# Patient Record
Sex: Female | Born: 1945
Health system: Southern US, Community
[De-identification: ages and names within clinical notes are randomized; demographics above are authoritative.]

## PROBLEM LIST (undated history)

## (undated) DIAGNOSIS — J9601 Acute respiratory failure with hypoxia: Secondary | ICD-10-CM

## (undated) DIAGNOSIS — J449 Chronic obstructive pulmonary disease, unspecified: Secondary | ICD-10-CM

## (undated) DIAGNOSIS — I313 Pericardial effusion (noninflammatory): Secondary | ICD-10-CM

## (undated) DIAGNOSIS — I509 Heart failure, unspecified: Secondary | ICD-10-CM

## (undated) DIAGNOSIS — J301 Allergic rhinitis due to pollen: Secondary | ICD-10-CM

## (undated) DIAGNOSIS — J439 Emphysema, unspecified: Secondary | ICD-10-CM

## (undated) DIAGNOSIS — I10 Essential (primary) hypertension: Secondary | ICD-10-CM

## (undated) DIAGNOSIS — T7840XA Allergy, unspecified, initial encounter: Secondary | ICD-10-CM

## (undated) DIAGNOSIS — I5189 Other ill-defined heart diseases: Secondary | ICD-10-CM

## (undated) DIAGNOSIS — I272 Pulmonary hypertension, unspecified: Secondary | ICD-10-CM

## (undated) DIAGNOSIS — Z889 Allergy status to unspecified drugs, medicaments and biological substances status: Secondary | ICD-10-CM

## (undated) DIAGNOSIS — I3139 Other pericardial effusion (noninflammatory): Secondary | ICD-10-CM

## (undated) DIAGNOSIS — Z87891 Personal history of nicotine dependence: Secondary | ICD-10-CM

## (undated) DIAGNOSIS — Z87440 Personal history of urinary (tract) infections: Secondary | ICD-10-CM

## (undated) DIAGNOSIS — R600 Localized edema: Secondary | ICD-10-CM

## (undated) DIAGNOSIS — M1990 Unspecified osteoarthritis, unspecified site: Secondary | ICD-10-CM

## (undated) DIAGNOSIS — I50810 Right heart failure, unspecified: Secondary | ICD-10-CM

## (undated) DIAGNOSIS — I119 Hypertensive heart disease without heart failure: Secondary | ICD-10-CM

## (undated) DIAGNOSIS — J45909 Unspecified asthma, uncomplicated: Secondary | ICD-10-CM

## (undated) DIAGNOSIS — Z8619 Personal history of other infectious and parasitic diseases: Secondary | ICD-10-CM

## (undated) HISTORY — DX: Allergic rhinitis due to pollen: J30.1

## (undated) HISTORY — PX: CHOLECYSTECTOMY: SHX55

## (undated) HISTORY — DX: Personal history of other infectious and parasitic diseases: Z86.19

## (undated) HISTORY — DX: Personal history of urinary (tract) infections: Z87.440

## (undated) HISTORY — DX: Unspecified osteoarthritis, unspecified site: M19.90

## (undated) HISTORY — DX: Emphysema, unspecified: J43.9

## (undated) HISTORY — DX: Unspecified asthma, uncomplicated: J45.909

## (undated) HISTORY — DX: Heart failure, unspecified: I50.9

## (undated) HISTORY — DX: Allergy, unspecified, initial encounter: T78.40XA

---

## 1998-06-25 ENCOUNTER — Other Ambulatory Visit: Admission: RE | Admit: 1998-06-25 | Discharge: 1998-06-25 | Payer: Self-pay | Admitting: Obstetrics & Gynecology

## 1998-11-26 ENCOUNTER — Other Ambulatory Visit: Admission: RE | Admit: 1998-11-26 | Discharge: 1998-11-26 | Payer: Self-pay | Admitting: Obstetrics & Gynecology

## 1998-12-08 DIAGNOSIS — M7500 Adhesive capsulitis of unspecified shoulder: Secondary | ICD-10-CM | POA: Insufficient documentation

## 1999-11-27 ENCOUNTER — Other Ambulatory Visit: Admission: RE | Admit: 1999-11-27 | Discharge: 1999-11-27 | Payer: Self-pay | Admitting: Obstetrics and Gynecology

## 2000-11-27 ENCOUNTER — Other Ambulatory Visit: Admission: RE | Admit: 2000-11-27 | Discharge: 2000-11-27 | Payer: Self-pay | Admitting: Obstetrics and Gynecology

## 2001-12-08 ENCOUNTER — Other Ambulatory Visit: Admission: RE | Admit: 2001-12-08 | Discharge: 2001-12-08 | Payer: Self-pay | Admitting: Obstetrics and Gynecology

## 2002-12-14 ENCOUNTER — Other Ambulatory Visit: Admission: RE | Admit: 2002-12-14 | Discharge: 2002-12-14 | Payer: Self-pay | Admitting: Obstetrics and Gynecology

## 2003-12-20 ENCOUNTER — Other Ambulatory Visit: Admission: RE | Admit: 2003-12-20 | Discharge: 2003-12-20 | Payer: Self-pay | Admitting: Obstetrics and Gynecology

## 2005-01-08 ENCOUNTER — Other Ambulatory Visit: Admission: RE | Admit: 2005-01-08 | Discharge: 2005-01-08 | Payer: Self-pay | Admitting: Obstetrics and Gynecology

## 2005-09-02 ENCOUNTER — Ambulatory Visit: Payer: Self-pay | Admitting: Family Medicine

## 2006-08-26 ENCOUNTER — Ambulatory Visit: Payer: Self-pay | Admitting: Family Medicine

## 2006-09-07 ENCOUNTER — Ambulatory Visit: Payer: Self-pay | Admitting: Family Medicine

## 2007-01-15 ENCOUNTER — Ambulatory Visit: Payer: Self-pay | Admitting: Family Medicine

## 2007-01-15 DIAGNOSIS — J309 Allergic rhinitis, unspecified: Secondary | ICD-10-CM | POA: Insufficient documentation

## 2007-01-15 DIAGNOSIS — F17200 Nicotine dependence, unspecified, uncomplicated: Secondary | ICD-10-CM | POA: Insufficient documentation

## 2007-05-17 ENCOUNTER — Ambulatory Visit: Payer: Self-pay | Admitting: Family Medicine

## 2007-05-17 DIAGNOSIS — J321 Chronic frontal sinusitis: Secondary | ICD-10-CM | POA: Insufficient documentation

## 2007-11-12 ENCOUNTER — Ambulatory Visit: Payer: Self-pay | Admitting: Family Medicine

## 2007-11-12 DIAGNOSIS — J029 Acute pharyngitis, unspecified: Secondary | ICD-10-CM | POA: Insufficient documentation

## 2007-12-22 ENCOUNTER — Ambulatory Visit: Payer: Self-pay | Admitting: Family Medicine

## 2007-12-22 DIAGNOSIS — S335XXA Sprain of ligaments of lumbar spine, initial encounter: Secondary | ICD-10-CM | POA: Insufficient documentation

## 2007-12-22 LAB — CONVERTED CEMR LAB
Bilirubin Urine: NEGATIVE
Glucose, Urine, Semiquant: NEGATIVE
Ketones, urine, test strip: NEGATIVE
pH: 6

## 2008-02-01 ENCOUNTER — Encounter: Admission: RE | Admit: 2008-02-01 | Discharge: 2008-02-01 | Payer: Self-pay | Admitting: Family Medicine

## 2008-02-01 ENCOUNTER — Ambulatory Visit: Payer: Self-pay | Admitting: Family Medicine

## 2008-02-01 ENCOUNTER — Encounter (INDEPENDENT_AMBULATORY_CARE_PROVIDER_SITE_OTHER): Payer: Self-pay | Admitting: Internal Medicine

## 2008-02-01 DIAGNOSIS — R062 Wheezing: Secondary | ICD-10-CM | POA: Insufficient documentation

## 2008-02-04 ENCOUNTER — Ambulatory Visit: Payer: Self-pay | Admitting: Family Medicine

## 2008-02-07 ENCOUNTER — Telehealth (INDEPENDENT_AMBULATORY_CARE_PROVIDER_SITE_OTHER): Payer: Self-pay | Admitting: *Deleted

## 2009-10-01 ENCOUNTER — Ambulatory Visit: Payer: Self-pay | Admitting: Family Medicine

## 2009-10-08 ENCOUNTER — Telehealth: Payer: Self-pay | Admitting: Family Medicine

## 2009-10-19 ENCOUNTER — Telehealth: Payer: Self-pay | Admitting: Family Medicine

## 2010-01-30 ENCOUNTER — Ambulatory Visit: Payer: Self-pay | Admitting: Family Medicine

## 2010-01-30 DIAGNOSIS — J019 Acute sinusitis, unspecified: Secondary | ICD-10-CM | POA: Insufficient documentation

## 2010-02-05 ENCOUNTER — Ambulatory Visit: Payer: Self-pay | Admitting: Family Medicine

## 2010-02-05 ENCOUNTER — Telehealth: Payer: Self-pay | Admitting: Family Medicine

## 2010-02-05 DIAGNOSIS — J209 Acute bronchitis, unspecified: Secondary | ICD-10-CM | POA: Insufficient documentation

## 2010-08-21 ENCOUNTER — Ambulatory Visit: Payer: Self-pay | Admitting: Family Medicine

## 2010-10-08 NOTE — Progress Notes (Signed)
Summary: still with sinus sxs  Phone Note Call from Patient Call back at Home Phone [phone]   Caller: Patient Summary of Call: Pt was given z-pack for sinus infection on 1/24. She says she is much better but still has some pain and fluid in her ears, some sinus congestion.  She is asking if she should have another course of z-pack, she is worried the infection will come back full force.  [facility]  Please advise pt, leave message if she is not in. Initial call taken by: Marty Heck CMA,  October 19, 2009 11:31 AM  Follow-up for Phone Call        Do not recommend repeat of antibitoics...instead..use mucinex reguarly and nasal saline irrigation 3-4 times daily.  MAke follow up appt if notcontinuing to improve.  Follow-up by: Eliezer Lofts MD,  October 19, 2009 1:54 PM  Additional Follow-up for Phone Call Additional follow up Details #1::        Left message on voicemail  in detail.  Personalized VM.  Additional Follow-up by: [name] CMA Deborra Medina),  October 19, 2009 2:58 PM

## 2010-10-08 NOTE — Progress Notes (Signed)
Summary: has a rash from the avelox  Phone Note Call from Patient Call back at Home Phone [phone]   Caller: Patient Call For: Arnette Norris MD Summary of Call: Patient started taking the avlox today and she says that now she has a rash all over her feet, arm, and hands. She says that it itches really bad and she feels sick on her stomach. She is not going to take the avelox anymore.  She wants something else called in to [facility]. Initial call taken by: Lacretia Nicks,  Feb 05, 2010 3:40 PM  Follow-up for Phone Call        We are kind of limited as what we can use because of her allergies.  I can try a Zpack. Arnette Norris MD  February 06, 2010 7:23 AM     New/Updated Medications: AZITHROMYCIN 250 MG  TABS (AZITHROMYCIN) 2 by  mouth today and then 1 daily for 4 days Prescriptions: AZITHROMYCIN 250 MG  TABS (AZITHROMYCIN) 2 by  mouth today and then 1 daily for 4 days  #6 x 0   Entered and Authorized by:   Arnette Norris MD   Signed by:   Arnette Norris MD on 02/06/2010   Method used:   Electronically to        [id number] (retail)       [address]       [place], [place]  [address]       Ph: [phone]       Fax: [phone]   RxID:   [id number]   Appended Document: has a rash from the avelox Patient advised as instructed via telephone.  She says she will try anything.  The Zpack is fine, [facility].

## 2010-10-08 NOTE — Progress Notes (Signed)
Summary: head congestion, and sinus  Phone Note Call from Patient Call back at [phone]   Caller: Patient Call For: Dr Deborra Medina Summary of Call: Saw Dr Deborra Medina on 10/01/09 z pack seemed to help and finished on 10/05/09. On 10/07/09 started with sinus stopping up and ears feel stopped .  Productive cough with clear mucus.No sorethroat, no fever. [facility]. Pt understands with lateness of the day it may be tomorrow when she hears decision. Please advise.  Initial call taken by: Ozzie Hoyle LPN,  October 08, 908 6:00 PM  Follow-up for Phone Call        I wouldn't add another antibiotic.  Zpack is still in her system (5 days after finishing last dose).  Takes time for symptoms to resolve.  Continue supportive care and let us know if symptoms worsen or do not improve in next 3-4 days. Follow-up by: Arnette Norris MD,  October 09, 2009 8:04 AM  Additional Follow-up for Phone Call Additional follow up Details #1::        Patient Advised.  Additional Follow-up by: [name] CMA ([place]),  October 09, 2009 10:39 AM

## 2010-10-08 NOTE — Assessment & Plan Note (Signed)
Summary: feeling worse/alc   Vital Signs:  Patient profile:   65 year old female Weight:      153.50 pounds Temp:     98.0 degrees F oral Pulse rate:   68 / minute Pulse rhythm:   regular BP sitting:   140 / 70  (left arm) Cuff size:   regular  Vitals Entered By: Sherrian Divers CMA Deborra Medina) (Feb 05, 2010 11:40 AM) CC: feeling worse, ear ache, cough   History of Present Illness: 65 yo here for worsening symptoms.  Seen on 5/25 with sinus congestion, treated with 7 day course of Bactrim.    Since that time, sinus congestion improved, now wheezing, worsened productive cough.  No SOB or chest pain.  No fevers.    Pt is a smoker.  Current Medications (verified): 1)  Bactrim Ds 800-160 Mg Tabs (Sulfamethoxazole-Trimethoprim) .[name].. 1 Tab By Mouth Two Times A Day X 7 Day 2)  Cheratussin Ac 100-10 Mg/441m Syrp (Guaifenesin-Codeine) .... 5 Ml By Mouth At Bedtime As Needed Cough 3)  Chantix Starting Month Pak 0.5 Mg X 11 & 1 Mg X 42  Misc (Varenicline Tartrate) .... 0.569mBy Mouth Once Daily For 3 Days, Then Twice Daily For 4 Days and Then 41m26my Mouth 2 Times Daily 4)  Avelox 400 Mg  Tabs (Moxifloxacin Hcl) ....[name]1 Tablet By Mouth Daily X 7  Days  Allergies: 1)  ! * Lorbid 2)  ! Augmentin 3)  ! * Omnicef  Review of Systems      See HPI General:  Complains of malaise; denies fever. Resp:  Complains of cough, sputum productive, and wheezing; denies shortness of breath.  Physical Exam  General:  alert, well-developed, well-nourished, and well-hydrated.  NAD Non toxic. Ears:  clear fluid TMs bilaterally. Nose:  erythemaous, improved. Mouth:  no exudates and pharyngeal erythema.   Lungs:  bilateral basal wheezes, left great than right. no crackles. No increased WOB Heart:  Normal rate and regular rhythm. S1 and S2 normal without gallop, murmur, click, rub or other extra sounds. Psych:  normally interactive and good eye contact.     Impression & Recommendations:  Problem # 1:   SINUSITIS, ACUTE (ICD-461.9) Assessment New improved.  The following medications were removed from the medication list:    Claritin-d 24 Hour 10-240 Mg Xr24h-tab (Loratadine-pseudoephedrine) ....[name] Take 1 tablet by mouth once a day Her updated medication list for this problem includes:    Bactrim Ds 800-160 Mg Tabs (Sulfamethoxazole-trimethoprim) ....[name] 1 tab by mouth two times a day x 7 day    Cheratussin Ac 100-10 Mg/5ml69mrp (Guaifenesin-codeine) .....Marland KitchenMarland KitchenMarland KitchenMarland Kitchenl by mouth at bedtime as needed cough    Avelox 400 Mg Tabs (Moxifloxacin hcl) .....[name]1 tablet by mouth daily x 7  days  Problem # 2:  ACUTE BRONCHITIS (ICD-466.0) Assessment: New DOes not want a CXR at this time. Will start Avelox to cover possible pneumonia/bronchitis. The following medications were removed from the medication list:    Claritin-d 24 Hour 10-240 Mg Xr24h-tab (Loratadine-pseudoephedrine) .....Marland KitchenTake 1 tablet by mouth once a day Her updated medication list for this problem includes:    Bactrim Ds 800-160 Mg Tabs (Sulfamethoxazole-trimethoprim) .....[name]1 tab by mouth two times a day x 7 day    Cheratussin Ac 100-10 Mg/5ml 44mp (Guaifenesin-codeine) ..... Marland KitchenMarland KitchenMarland KitchenMarland Kitchen by mouth at bedtime as needed cough    Avelox 400 Mg Tabs (Moxifloxacin hcl) ..... [name] tablet by mouth daily x 7  days  Complete Medication List:  1)  Bactrim Ds 800-160 Mg Tabs (Sulfamethoxazole-trimethoprim) .[name].. 1 tab by mouth two times a day x 7 day 2)  Cheratussin Ac 100-10 Mg/65m Syrp (Guaifenesin-codeine) .... 5 ml by mouth at bedtime as needed cough 3)  Chantix Starting Month Pak 0.5 Mg X 11 & 1 Mg X 42 Misc (Varenicline tartrate) .... 0.527mby mouth once daily for 3 days, then twice daily for 4 days and then 74m174my mouth 2 times daily 4)  Avelox 400 Mg Tabs (Moxifloxacin hcl) ....[name]1 tablet by mouth daily x 7  days Prescriptions: AVELOX 400 MG  TABS (MOXIFLOXACIN HCL) 1 tablet by mouth daily x 7  days  #7 x 0   Entered and Authorized by:   TalArnette Norris   Signed  by:   TalArnette Norris on 02/05/2010   Method used:   [facility]        [id number]       [address]    [place]  [id number]    Ph: [phone]    Fax: [phone]:   [id number] Allergies (reviewed today): ! * LORBID ! AUGMENTIN ! * OMNICEF

## 2010-10-08 NOTE — Assessment & Plan Note (Signed)
Summary: SINUS SXS   Vital Signs:  Patient profile:   65 year old female Weight:      154.50 pounds Temp:     98.1 degrees F oral Pulse rate:   80 / minute Pulse rhythm:   regular BP sitting:   140 / 78  (left arm) Cuff size:   regular  Vitals Entered By: Sherrian Divers CMA Deborra Medina) (Jan 30, 2010 11:46 AM) CC: sinus problems   History of Present Illness: 65 yo here for sinus congestion.  Started approx 1 week ago, progressing with worsening facial pressure, chills, fever. Productive cough. No wheezing or SOB. Sore throat. Taking claritin with minimal relief of symptoms.  Coughing spells at night, can't sleep.  Tobacco abuse- 1/2 ppd x 40 years.  REady to quit.  Knows that it is worsening above symptoms. Tried Wellbutrin in past, not helpful and did not like how it made her feel.  no h/o SI.  Current Medications (verified): 1)  Claritin-D 24 Hour 10-240 Mg Xr24h-Tab (Loratadine-Pseudoephedrine) .... Take 1 Tablet By Mouth Once A Day 2)  Calcium Carbonate-Vitamin D 600-400 Mg-Unit  Tabs (Calcium Carbonate-Vitamin D) .... Take 1 Tablet By Mouth Two Times A Day 3)  Bactrim Ds 800-160 Mg Tabs (Sulfamethoxazole-Trimethoprim) .[name].. 1 Tab By Mouth Two Times A Day X 7 Day 4)  Cheratussin Ac 100-10 Mg/48m Syrp (Guaifenesin-Codeine) .... 5 Ml By Mouth At Bedtime As Needed Cough 5)  Chantix Starting Month Pak 0.5 Mg X 11 & 1 Mg X 42  Misc (Varenicline Tartrate) .... 0.548mBy Mouth Once Daily For 3 Days, Then Twice Daily For 4 Days and Then 21m32my Mouth 2 Times Daily  Allergies: 1)  ! * Lorbid 2)  ! Augmentin 3)  ! * Omnicef  Review of Systems      See HPI General:  Complains of chills and fever. ENT:  Complains of nasal congestion, postnasal drainage, and sinus pressure. Resp:  Complains of coughing up blood and sputum productive; denies shortness of breath and wheezing.  Physical Exam  General:  alert, well-developed, well-nourished, and well-hydrated.  NAD Non toxic. Ears:  clear  fluid TMs bilaterally. Nose:  nasal dischargemucosal pallor.   Mouth:  no exudates and pharyngeal erythema.   Lungs:  normal respiratory effort, no intercostal retractions, no accessory muscle use, no crackles, and no wheezes.   Heart:  Normal rate and regular rhythm. S1 and S2 normal without gallop, murmur, click, rub or other extra sounds. Psych:  normally interactive and good eye contact.     Impression & Recommendations:  Problem # 1:  SINUSITIS, ACUTE (ICD-461.9) Assessment New Given duration and progression of symptoms, will treat for bacterial sinusitis with Bactrim. cheratussin as needed cough. The following medications were removed from the medication list:    Cheratussin Ac 100-10 Mg/5ml28mrp (Guaifenesin-codeine) .....Marland KitchenMarland KitchenMarland KitchenMarland Kitchenl two times a day for cough. Her updated medication list for this problem includes:    Claritin-d 24 Hour 10-240 Mg Xr24h-tab (Loratadine-pseudoephedrine) .....Marland KitchenTake 1 tablet by mouth once a day    Bactrim Ds 800-160 Mg Tabs (Sulfamethoxazole-trimethoprim) .....[name]1 tab by mouth two times a day x 7 day    Cheratussin Ac 100-10 Mg/5ml 121mp (Guaifenesin-codeine) ..... Marland KitchenMarland KitchenMarland KitchenMarland Kitchen by mouth at bedtime as needed cough  Problem # 2:  TOBACCO ABUSE (ICD-305.1) Assessment: Unchanged Time spent with patient 25 minutes, more than 50% of this time was spent counseling patient on smoking cessation. Will try Chantix.  Follow up in one month.  Her  updated medication list for this problem includes:    Chantix Starting Month Pak 0.5 Mg X 11 & 1 Mg X 42 Misc (Varenicline tartrate) .[name]... 0.71m by mouth once daily for 3 days, then twice daily for 4 days and then 179mby mouth 2 times daily  Complete Medication List: 1)  Claritin-d 24 Hour 10-240 Mg Xr24h-tab (Loratadine-pseudoephedrine) .... Take 1 tablet by mouth once a day 2)  Calcium Carbonate-vitamin D 600-400 Mg-unit Tabs (Calcium carbonate-vitamin d) .... Take 1 tablet by mouth two times a day 3)  Bactrim Ds 800-160 Mg Tabs  (Sulfamethoxazole-trimethoprim) ...[name] 1 tab by mouth two times a day x 7 day 4)  Cheratussin Ac 100-10 Mg/73m6myrp (Guaifenesin-codeine) .... 5 ml by mouth at bedtime as needed cough 5)  Chantix Starting Month Pak 0.5 Mg X 11 & 1 Mg X 42 Misc (Varenicline tartrate) .... 0.73mg16m mouth once daily for 3 days, then twice daily for 4 days and then 1mg 57mmouth 2 times daily  Patient Instructions: 1)  Take antibiotic as directed.  Drink lots of fluids.  Treat sympotmatically with Mucinex, nasal saline irrigation, and Tylenol/Ibuprofen. Also try claritin D or zyrtec D over the counter- two times a day as needed ( have to sign for them at pharmacy). You can use warm compresses.  Cough suppressant at night. Call if not improving as expected in 5-7 days.  2)  Stop smoking tips: Choose a quit date. Cut down before the quit date. Decide what you will do as a substitute when you feel the urge to smoke(gum, toothpick, exercise).   Prescriptions: CHANTIX STARTING MONTH PAK 0.5 MG X 11 & 1 MG X 42  MISC (VARENICLINE TARTRATE) 0.73mg b46mouth once daily for 3 days, then twice daily for 4 days and then 1mg by66muth 2 times daily  #1 pack x 0   Entered and Authorized by:   Hommer Cunliffe AArnette Norrisigned by:   Uchechi Denison AArnette Norris05/25/2011   Method used:   [id number]       [address]  [address]Ph: [phone]: [phone]:   [id number] AC 100-10 MG/5ML SYRP (GUAIFENESIN-CODEINE) 5 ml by mouth at bedtime as needed cough  #4 oucnes x 0   Entered and Authorized by:   Amorina Doerr AArnette Norrisigned by:   Anara Cowman AArnette Norris05/25/2011   Method used:   Print then Give to Patient   RxID:   [id number] DS 800-160 MG TABS (SULFAMETHOXAZOLE-TRIMETHOPRIM) 1 tab by mouth two times a day x 7 day  #14 x 0   Entered and Authorized by:   Aliana Kreischer AArnette Norrisigned by:   Marceil Welp AArnette Norris05/25/2011    Method used:   [facility]        [id number]       [address]  [address]Ph: [phone]: [phone]:   [phone] Allergies (reviewed today): ! * LORBID ! AUGMENTIN ! *  OMNICEF

## 2010-10-08 NOTE — Assessment & Plan Note (Signed)
Summary: 11:15 EARS,CONGESTION/CLE   Vital Signs:  Patient profile:   65 year old female Weight:      159 pounds Temp:     97.6 degrees F oral Pulse rate:   88 / minute Pulse rhythm:   regular BP sitting:   162 / 78  (left arm) Cuff size:   regular  Vitals Entered By: [name] CMA Deborra Medina) (October 01, 2009 11:12 AM) CC: Ears hurt, head congestion   History of Present Illness: 65 yo with 1 week of worsening URI symptoms. Son and husband have same symptoms, both being treated for sinusitis. Started out with sneezing, now has frontal headache, sinus pressure and drainage. Productive cough.  No wheezing, shortness of breath. No fevers but does have chills and body aches. Mild ear popping. No sore throat. No n/v/d.  BP elevated, better at last OV. Says its normal at home but she has been taking Sudafed OTC and always goes up when she is sick. No CP or blurred vision.  Current Medications (verified): 1)  Claritin-D 24 Hour 10-240 Mg Xr24h-Tab (Loratadine-Pseudoephedrine) .... Take 1 Tablet By Mouth Once A Day 2)  Calcium Carbonate-Vitamin D 600-400 Mg-Unit  Tabs (Calcium Carbonate-Vitamin D) .... Take 1 Tablet By Mouth Two Times A Day 3)  Azithromycin 250 Mg  Tabs (Azithromycin) .... 2 By  Mouth Today and Then 1 Daily For 4 Days 4)  Cheratussin Ac 100-10 Mg/58m Syrp (Guaifenesin-Codeine) .... 5 Ml Two Times A Day For Cough.  Allergies: 1)  ! * Lorbid 2)  ! Augmentin 3)  ! * Omnicef  Review of Systems      See HPI General:  Complains of chills and malaise; denies fever. Eyes:  Denies blurring. ENT:  Complains of nasal congestion, postnasal drainage, and sinus pressure; denies difficulty swallowing, ear discharge, earache, and sore throat. CV:  Denies chest pain or discomfort. Resp:  Complains of cough and sputum productive; denies shortness of breath and wheezing. GI:  Denies abdominal pain, nausea, and vomiting.  Physical Exam  General:  alert, well-developed,  well-nourished, and well-hydrated.  NAD Non toxic. Ears:  clear fluid TMs bilaterally. Nose:  nasal dischargemucosal pallor.   Mouth:  no exudates and pharyngeal erythema.   Lungs:  normal respiratory effort, no intercostal retractions, no accessory muscle use, no crackles, and no wheezes.   Heart:  Normal rate and regular rhythm. S1 and S2 normal without gallop, murmur, click, rub or other extra sounds. Psych:  normally interactive and good eye contact.     Impression & Recommendations:  Problem # 1:  FRONTAL SINUSITIS (ICD-473.1) Assessment New Given duration of symptoms, will treat with abx.  Has allergy to Augmentin, [place] Continue supportive care. See pt instructions for details. The following medications were removed from the medication list:    Zithromax Z-pak 250 Mg Tabs (Azithromycin) ..... Use as directed Her updated medication list for this problem includes:    Claritin-d 24 Hour 10-240 Mg Xr24h-tab (Loratadine-pseudoephedrine) ..[name].. Take 1 tablet by mouth once a day    Azithromycin 250 Mg Tabs (Azithromycin) ..[name].. 2 by  mouth today and then 1 daily for 4 days    Cheratussin Ac 100-10 Mg/535mSyrp (Guaifenesin-codeine) ...Marland KitchenMarland KitchenMarland KitchenMarland Kitchen ml two times a day for cough.  Complete Medication List: 1)  Claritin-d 24 Hour 10-240 Mg Xr24h-tab (Loratadine-pseudoephedrine) .... Take 1 tablet by mouth once a day 2)  Calcium Carbonate-vitamin D 600-400 Mg-unit Tabs (Calcium carbonate-vitamin d) .... Take 1 tablet by mouth two times a  day 3)  Azithromycin 250 Mg Tabs (Azithromycin) .... 2 by  mouth today and then 1 daily for 4 days 4)  Cheratussin Ac 100-10 Mg/25m Syrp (Guaifenesin-codeine) .... 5 ml two times a day for cough.  Patient Instructions: 1)  Take antibiotic as directed.  Drink lots of fluids.  Treat sympotmatically with Mucinex, nasal saline irrigation, and Tylenol/Ibuprofen. You can also try using warm compresses.  Cough suppressant at night. Call if not improving as  expected in 5-7 days.  Prescriptions: CHERATUSSIN AC 100-10 MG/5ML SYRP (GUAIFENESIN-CODEINE) 5 ml two times a day for cough.  #4 ounces x 0   Entered and Authorized by:   TArnette NorrisMD   Signed by:   TArnette NorrisMD on 10/01/2009   Method used:   Print then Give to Patient   RxID:   [id number] 250 MG  TABS (AZITHROMYCIN) 2 by  mouth today and then 1 daily for 4 days  #6 x 0   Entered and Authorized by:   TArnette NorrisMD   Signed by:   TArnette NorrisMD on 10/01/2009   Method used:   [facility]        [id number]       [address]      [place]  [address]      Ph: [phone]      Fax: [phone]  RxID:   [id number]  Current Allergies (reviewed today): ! * LORBID ! AUGMENTIN ! * OMNICEF

## 2010-10-10 NOTE — Miscellaneous (Signed)
Summary: allergy to avelox  Clinical Lists Changes  Allergies: Added new allergy or adverse reaction of * AVELOX

## 2010-10-10 NOTE — Assessment & Plan Note (Signed)
Summary: sinus infection/dlo   Vital Signs:  Patient profile:   65 year old female Weight:      150.25 pounds Temp:     97.6 degrees F oral Pulse rate:   80 / minute Pulse rhythm:   regular BP sitting:   140 / 90  (left arm) Cuff size:   regular  Vitals Entered By: Sherrian Divers CMA Deborra Medina) (August 21, 2010 12:08 PM) CC: sinus infection   History of Present Illness: 65 yo here for ? sinus infection.  Feels "awful."  Has had sinus pressure and runny nose for 5 days. Says symptoms are progressing. Now coughing up mucous.   No fever or chills. Does have myalgias.  No CP or increased WOB.  Current Medications (verified): 1)  Azithromycin 250 Mg  Tabs (Azithromycin) .... 2 By  Mouth Today and Then 1 Daily For 4 Days 2)  Tussionex Pennkinetic Er 8-10 Mg/48m Lqcr (Chlorpheniramine-Hydrocodone) .... 5 Ml By Mouth Two Times A Day As Needed Cough  Allergies: 1)  ! * Lorbid 2)  ! Augmentin 3)  ! * Omnicef 4)  ! * Avelox  Past History:  Past Medical History: Last updated: 10/28/2007 Allergic rhinitis (01/06/1998)  Risk Factors: Smoking Status: current (01/15/2007)  Review of Systems      See HPI General:  Denies chills and fever. ENT:  Complains of sinus pressure and sore throat. Resp:  Complains of cough, sputum productive, and wheezing; denies shortness of breath.  Physical Exam  General:  alert, well-developed, well-nourished, and well-hydrated.  NAD Non toxic. Nose:  mucosal erythema.  no obstruction. frontal sinuses TTP Mouth:  no exudates and pharyngeal erythema.   Lungs:  bilateral basal wheezes, no crackles. No increased WOB Heart:  Normal rate and regular rhythm. S1 and S2 normal without gallop, murmur, click, rub or other extra sounds. Psych:  normally interactive and good eye contact.     Impression & Recommendations:  Problem # 1:  SINUSITIS, ACUTE (ICD-461.9) Assessment New with wheezes on exam as well. Given progression of symptoms, will treat  with Zpack. Supportive care as per pt instructions. The following medications were removed from the medication list:    Bactrim Ds 800-160 Mg Tabs (Sulfamethoxazole-trimethoprim) ..[name].. 1 tab by mouth two times a day x 7 day    Cheratussin Ac 100-10 Mg/51mSyrp (Guaifenesin-codeine) ...Marland KitchenMarland KitchenMarland KitchenMarland Kitchen ml by mouth at bedtime as needed cough Her updated medication list for this problem includes:    Azithromycin 250 Mg Tabs (Azithromycin) ...[name]. 2 by  mouth today and then 1 daily for 4 days    Tussionex Pennkinetic Er 8-10 Mg/68m82mqcr (Chlorpheniramine-hydrocodone) ....Marland KitchenMarland KitchenMarland KitchenMarland Kitchenml by mouth two times a day as needed cough  Complete Medication List: 1)  Azithromycin 250 Mg Tabs (Azithromycin) .... 2 by  mouth today and then 1 daily for 4 days 2)  Tussionex Pennkinetic Er 8-10 Mg/68ml67mcr (Chlorpheniramine-hydrocodone) .... 5 ml by mouth two times a day as needed cough  Patient Instructions: 1)  Take antibiotic as directed.  Drink lots of fluids.  Treat sympotmatically with Mucinex, nasal saline irrigation, and Tylenol/Ibuprofen. Also try claritin D or zyrtec D over the counter- two times a day as needed ( have to sign for them at pharmacy). You can use warm compresses.  Cough suppressant at night. Call if not improving as expected in 5-7 days.  2)  [facility]. Prescriptions: TUSSIONEX PENNKINETIC ER 8-10 MG/5ML LQCR (CHLORPHENIRAMINE-HYDROCODONE) 5 ml by mouth two times a day  as needed cough  #5 ounces x 0   Entered and Authorized by:   Arnette Norris MD   Signed by:   Arnette Norris MD on 08/21/2010   Method used:   Print then Give to Patient   RxID:   [id number] AZITHROMYCIN 250 MG  TABS (AZITHROMYCIN) 2 by  mouth today and then 1 daily for 4 days  #6 x 0   Entered and Authorized by:   Arnette Norris MD   Signed by:   Arnette Norris MD on 08/21/2010   Method used:   Electronically to        [id number] (retail)       [address]       [place], [place]   [address]       Ph: [phone]       Fax: [phone]   RxID:   [id number]    Orders Added: 1)  Est. Patient Level III [98921]    Prior Medications (reviewed today): None Current Allergies (reviewed today): ! * LORBID ! AUGMENTIN ! * OMNICEF ! * AVELOX

## 2011-03-19 ENCOUNTER — Ambulatory Visit (INDEPENDENT_AMBULATORY_CARE_PROVIDER_SITE_OTHER): Payer: BC Managed Care – PPO | Admitting: Family Medicine

## 2011-03-19 ENCOUNTER — Encounter: Payer: Self-pay | Admitting: Family Medicine

## 2011-03-19 DIAGNOSIS — J209 Acute bronchitis, unspecified: Secondary | ICD-10-CM

## 2011-03-19 DIAGNOSIS — F17200 Nicotine dependence, unspecified, uncomplicated: Secondary | ICD-10-CM

## 2011-03-19 MED ORDER — ALBUTEROL SULFATE HFA 108 (90 BASE) MCG/ACT IN AERS: 2.0000 | INHALATION_SPRAY | Freq: Four times a day (QID) | RESPIRATORY_TRACT | Status: DC | PRN

## 2011-03-19 MED ORDER — DOXYCYCLINE HYCLATE 100 MG PO TABS: 100.0000 mg | ORAL_TABLET | Freq: Two times a day (BID) | ORAL | Status: AC

## 2011-03-19 MED ORDER — HYDROCOD POLST-CHLORPHEN POLST 10-8 MG/5ML PO LQCR: 5.0000 mL | Freq: Every evening | ORAL | Status: DC | PRN

## 2011-03-19 NOTE — Progress Notes (Signed)
Linda Stevens, a 65 y.o. female presents today in the office for the following:   Every part of body is aching Coughing and cannot sleep. Started yesterday  Granddaughter was like that yestreday Throwing up and having a fever.  Tongue was white.   Clammy and freezing some Achy in the maxillary sinuses  Wheezing and sob  Acute Bronchitis: Patient presents for presents evaluation of chills, dyspnea, myalgias, nasal congestion, productive cough, sore throat and wheezing. Symptoms began 2 days ago and are gradually worsening since that time.  Past history is significant for significant tobacco abuse  Patient Active Problem List  Diagnoses  . TOBACCO ABUSE  . ALLERGIC  RHINITIS  . Bronchitis, acute, with bronchospasm   Past Medical History  Diagnosis Date  . Allergic rhinitis due to pollen    No past surgical history on file. History  Substance Use Topics  . Smoking status: Current Everyday Smoker  . Smokeless tobacco: Not on file  . Alcohol Use: Not on file   No family history on file. Allergies  Allergen Reactions  . Cefdinir     REACTION: gi  upset  . IRW:[id number]+PPJKDTOIZ+TIWPYKDXIP Acid+Aspartame     REACTION: gi upset  . Moxifloxacin     REACTION: rash   No current outpatient prescriptions on file prior to visit.   ROS: GEN: Acute illness details above GI: Tolerating PO intake GU: maintaining adequate hydration and urination Pulm: above Interactive and getting along well at home.  Otherwise, ROS is as per the HPI.  [name] Physical Exam  Blood pressure 140/92, pulse 118, temperature 97.9 F (36.6 C), temperature source Oral, height _0  (1.6 m), weight 150 lb 6.4 oz (68.221 kg), SpO2 91.00%.  GEN: WDWN, NAD, Non-toxic, A & O x 3 HEENT: Atraumatic, Normocephalic. Neck supple. No masses, No LAD. Ears and Nose: No external deformity. CV: RRR, No M/G/R. No JVD. No thrill. No extra heart sounds. PULM: diffuse exp wheezes, mild. No resp. distress. No  accessory muscle use. ABD: S, NT, ND, +BS. No rebound tenderness. No HSM.  EXTR: No c/c/e NEURO Normal gait.  PSYCH: Normally interactive. Conversant. Not depressed or anxious appearing.  Calm demeanor.   Assessment and Plan: 1.  Acute bronchitis with wheezing. Doxy, b agonists, cough meds 2. Tobacco abuse, suspect early COPD is possible.

## 2011-03-19 NOTE — Patient Instructions (Signed)
BRONCHITIS -Viral or baterial infections of the lung. Fever, cough, chest pain, shortness of breath, phlegm production, fatigue are symptoms.  Treatment: 1. Take all medicines 2. Antibiotics  3. Cough suppressants 4. Bronchodilators: an inhaler 5. Expectorant like Guaifenesin (Robitussin, Mucinex)  Fluids and Moisture help: drink lots of fluids Vaporizier or humidifier in room, shower steam --help loosen secretions and sooth breathing passages  Elevate head slightly when trying to sleep.

## 2011-08-04 ENCOUNTER — Ambulatory Visit (INDEPENDENT_AMBULATORY_CARE_PROVIDER_SITE_OTHER): Payer: BC Managed Care – PPO | Admitting: Family Medicine

## 2011-08-04 ENCOUNTER — Encounter: Payer: Self-pay | Admitting: Family Medicine

## 2011-08-04 VITALS — BP 140/90 | HR 100 | Temp 98.1°F | Ht 63.0 in | Wt 154.2 lb

## 2011-08-04 DIAGNOSIS — L0390 Cellulitis, unspecified: Secondary | ICD-10-CM

## 2011-08-04 DIAGNOSIS — L0291 Cutaneous abscess, unspecified: Secondary | ICD-10-CM | POA: Insufficient documentation

## 2011-08-04 MED ORDER — SULFAMETHOXAZOLE-TMP DS 800-160 MG PO TABS: 1.0000 | ORAL_TABLET | Freq: Two times a day (BID) | ORAL | Status: AC

## 2011-08-04 MED ORDER — OXYCODONE-ACETAMINOPHEN 5-325 MG PO TABS: 1.0000 | ORAL_TABLET | Freq: Four times a day (QID) | ORAL | Status: DC | PRN

## 2011-08-04 NOTE — Patient Instructions (Signed)
Abscess An abscess (boil or furuncle) is an infected area that contains a collection of pus.  SYMPTOMS Signs and symptoms of an abscess include pain, tenderness, redness, or hardness. You may feel a moveable soft area under your skin. An abscess can occur anywhere in the body.  TREATMENT  A surgical cut (incision) may be made over your abscess to drain the pus. Gauze may be packed into the space or a drain may be looped through the abscess cavity (pocket). This provides a drain that will allow the cavity to heal from the inside outwards. The abscess may be painful for a few days, but should feel much better if it was drained.  Your abscess, if seen early, may not have localized and may not have been drained. If not, another appointment may be required if it does not get better on its own or with medications. HOME CARE INSTRUCTIONS   Only take over-the-counter or prescription medicines for pain, discomfort, or fever as directed by your caregiver.   Take your antibiotics as directed if they were prescribed. Finish them even if you start to feel better.   Keep the skin and clothes clean around your abscess.   If the abscess was drained, you will need to use gauze dressing to collect any draining pus. Dressings will typically need to be changed 3 or more times a day.   The infection may spread by skin contact with others. Avoid skin contact as much as possible.   Practice good hygiene. This includes regular hand washing, cover any draining skin lesions, and do not share personal care items.   If you participate in sports, do not share athletic equipment, towels, whirlpools, or personal care items. Shower after every practice or tournament.   If a draining area cannot be adequately covered:   Do not participate in sports.   Children should not participate in day care until the wound has healed or drainage stops.   If your caregiver has given you a follow-up appointment, it is very important  to keep that appointment. Not keeping the appointment could result in a much worse infection, chronic or permanent injury, pain, and disability. If there is any problem keeping the appointment, you must call back to this facility for assistance.  SEEK MEDICAL CARE IF:   You develop increased pain, swelling, redness, drainage, or bleeding in the wound site.   You develop signs of generalized infection including muscle aches, chills, fever, or a general ill feeling.   You have an oral temperature above 102 F (38.9 C).  MAKE SURE YOU:   Understand these instructions.   Will watch your condition.   Will get help right away if you are not doing well or get worse.  Document Released: 06/04/2005 Document Revised: 05/07/2011 Document Reviewed: 03/28/2008 [facility] Patient Information 2012 [place].

## 2011-08-04 NOTE — Progress Notes (Signed)
  SUBJECTIVE: Linda Stevens is a 65 y.o. female who presents with erythema, tenderness and boil under left arm x 2 weeks.   Associated symptoms: pain. No fevers, chills, nausea or vomiting  Recent treatment: none  No h/o boils or MRSA but cares for her elderly mother in law who has had MRSA hospitalizations recently.  Allergies: Cefdinir; OLI:DCVUDTHYHOO+ILNZVJKQA+SUORVIFBPP acid+aspartame; and Moxifloxacin Patient Active Problem List  Diagnoses  . TOBACCO ABUSE  . ALLERGIC  RHINITIS  . Bronchitis, acute, with bronchospasm    OBJECTIVE: BP 140/90  Pulse 100  Temp(Src) 98.1 F (36.7 C) (Oral)  Ht _0  (1.6 m)  Wt 154 lb 4 oz (69.967 kg)  BMI 27.32 kg/m2  SpO2 92%  APPEARANCE: Alert, oriented, no acute distress  CARDIOVASCULAR: regular rate and rhythm, no murmurs  RESPIRATORY: clear to auscultation, no wheezes or rales and unlabored breathing  LESION SIZE/LOCATION: 2 cm, under left axill  LESION DESCRIPTION: non fluctuant with erythema  ASSOCIATED SIGNS: none  SYSTEMIC SYMPTOMS: none   Assessment and Plan: 1. Abscess and cellulitis    New. Non fluctuant. Will treat with Bactrim given exposure to MRSA, advised warm compresses. If does not open and drain, will require I and D. Red flags given. The patient indicates understanding of these issues and agrees with the plan.

## 2011-08-05 ENCOUNTER — Telehealth: Payer: Self-pay | Admitting: *Deleted

## 2011-08-05 NOTE — Telephone Encounter (Signed)
Pt called to report that the cyst under her arm has opened up and is oozing.  I told her not to mash on it, she says she has a little bit.  She will take her antibiotic and will call back if not better.

## 2011-08-05 NOTE — Telephone Encounter (Signed)
Great. Thank you.

## 2011-11-14 ENCOUNTER — Telehealth: Payer: Self-pay | Admitting: Family Medicine

## 2011-11-14 NOTE — Telephone Encounter (Signed)
Agreed.

## 2011-11-14 NOTE — Telephone Encounter (Signed)
Triage Record Num: [id number] Operator: Abe People Patient Name: Linda Stevens Call Date & Time: 11/14/2011 9:53:47AM Patient Phone: [phone] PCP: Patient Gender: Female PCP Fax : Patient DOB: 1946/02/12 Practice Name: [facility] Reason for Call: Caller: [name]/Patient; PCP: Arnette Norris [facility]); CB#: [phone]; Call regarding Thrush. Pt has been using inhaler x 4-5 days and has whiteness on touge and mouth soreness that started 11/13/11. She thinks it is triggered by the Albuterol. Traiged Mouth Lesions and all emergent SX R/O. Home care and call back inst given. Protocol(s) Used: Mouth Lesions Recommended Outcome per Protocol: Provide Home/Self Care Reason for Outcome: Painful pale yellow or white spot(s) on the lining of the mouth, gums, or tongue Care Advice: ~ Avoid eating salty, spicy, or acidic foods. Consider using over-the-counter medication or lozenges to numb the ulcer and relieve the pain as directed on label or by pharmacist. ~ The sores can be cleaned several times a day using a solution of equal amounts of water and 3% hydrogen peroxide. Use a cotton swab to apply this mixture. Follow this with an application of Milk of Magnesia using a clean cotton swab. ~ ~ Call provider if symptoms do not improve after 72 hours of home care. ~ Try to avoid trauma to your mouth - chew slowly, avoid chewing and talking at the same time. ~ Use a soft bristle toothbrush to gently brush teeth at least twice a day. Floss at least once a day. Call a provider for sores that last more than 3 weeks, become worse, are very large, cause uncontrolled pain, or you have frequently recurring mouth sores. ~ Increase intake of fluids. Drinking through a straw may reduce discomfort. Eat foods that are easy to swallow. Avoid drinking carbonated or alcoholic drinks. ~ Rinse mouth with a salt solution (1/2 tsp. salt in 8 ounces [.2 liter] of water) or an antiseptic mouthwash  without alcohol 3 or more times a day after eating to relieve symptoms. The solution should not be swallowed. ~ Analgesic/Antipyretic Advice - NSAIDs: Consider aspirin, ibuprofen, naproxen or ketoprofen for pain or fever as directed on label or by pharmacist/provider. PRECAUTIONS: - If over 72 years of age, should not take longer than 1 week without consulting provider. EXCEPTIONS: - Should not be used if taking blood thinners or have bleeding problems. - Do not use if have history of sensitivity/allergy to any of these medications; or history of cardiovascular, ulcer, kidney, liver disease or diabetes unless approved by provider. - Do not exceed recommended dose or frequency. ~ 11/14/2011 10:07:12AM Page 1 of 1 [id number]

## 2011-12-22 ENCOUNTER — Telehealth: Payer: Self-pay | Admitting: Family Medicine

## 2011-12-22 MED ORDER — LEVALBUTEROL TARTRATE 45 MCG/ACT IN AERO: 1.0000 | INHALATION_SPRAY | RESPIRATORY_TRACT | Status: DC | PRN

## 2011-12-22 NOTE — Telephone Encounter (Signed)
Advised pt

## 2011-12-22 NOTE — Telephone Encounter (Signed)
[name]  [name]; PCP: Arnette Norris ([facility]); CB#: [phone]; ; ; Call regarding Side Effects With Ventolin Inhaler;  The pt is calling to say that she was having a sore tongue and mouth following Albuterol administration. She had been using it for several days when she noticed that. Pt states she did talk with Dr. Aleda Grana nurse about it. She hasn't used it since and doesn't want to restart it. When she stopped using it the sx stopped in 2 days and haven't returned. Pt will not use it again. she is still taking Claritin D. Pt is calling to see if there is another inhaler the MD would call in for her. OFFICE PLEASE CALL PT BACK. [facility] [address] [facility] [address]. Pt is mainly congested with allergies Vonna Kotyk tighness. No wheezing.

## 2011-12-22 NOTE — Telephone Encounter (Signed)
Rx for xopenex sent to her pharmacy.  Please keep [place] posted with symptoms.

## 2011-12-22 NOTE — Telephone Encounter (Signed)
Left message asking that pt call back.

## 2011-12-29 ENCOUNTER — Encounter: Payer: Self-pay | Admitting: Family Medicine

## 2011-12-29 ENCOUNTER — Ambulatory Visit (INDEPENDENT_AMBULATORY_CARE_PROVIDER_SITE_OTHER): Payer: BC Managed Care – PPO | Admitting: Family Medicine

## 2011-12-29 VITALS — BP 160/90 | HR 104 | Temp 97.5°F | Wt 157.0 lb

## 2011-12-29 DIAGNOSIS — J329 Chronic sinusitis, unspecified: Secondary | ICD-10-CM

## 2011-12-29 DIAGNOSIS — J209 Acute bronchitis, unspecified: Secondary | ICD-10-CM

## 2011-12-29 MED ORDER — MONTELUKAST SODIUM 10 MG PO TABS: 10.0000 mg | ORAL_TABLET | Freq: Every day | ORAL | Status: DC

## 2011-12-29 MED ORDER — FLUTICASONE PROPIONATE 50 MCG/ACT NA SUSP: 2.0000 | Freq: Every day | NASAL | Status: DC

## 2011-12-29 MED ORDER — AZITHROMYCIN 250 MG PO TABS: ORAL_TABLET | ORAL | Status: DC

## 2011-12-29 MED ORDER — HYDROCOD POLST-CHLORPHEN POLST 10-8 MG/5ML PO LQCR: 5.0000 mL | Freq: Every evening | ORAL | Status: DC | PRN

## 2011-12-29 NOTE — Progress Notes (Signed)
SUBJECTIVE:  Linda Stevens is a 66 y.o. female who complains of coryza, congestion, sneezing, productive cough and chills for 21 days. She denies a history of anorexia, chest pain and shortness of breath and admits to a history of asthma. Patient admits to smoke cigarettes.   Taking Mucinex, Claritin D, Xopenex as needed (twice a day).  Patient Active Problem List  Diagnoses  . TOBACCO ABUSE  . ALLERGIC  RHINITIS  . Bronchitis, acute, with bronchospasm  . Abscess and cellulitis   Past Medical History  Diagnosis Date  . Allergic rhinitis due to pollen    No past surgical history on file. History  Substance Use Topics  . Smoking status: Current Everyday Smoker  . Smokeless tobacco: Not on file  . Alcohol Use: Not on file   No family history on file. Allergies  Allergen Reactions  . Cefdinir     REACTION: gi  upset  . VFI:EPPIRJJOACZ+YSAYTKZSW+FUXNATFTDD Acid+Aspartame     REACTION: gi upset  . Moxifloxacin     REACTION: rash   Current Outpatient Prescriptions on File Prior to Visit  Medication Sig Dispense Refill  . acetaminophen (TYLENOL) 500 MG tablet Take 1,000 mg by mouth every 6 (six) hours as needed.        [name] albuterol (PROVENTIL HFA;VENTOLIN HFA) 108 (90 BASE) MCG/ACT inhaler Inhale 2 puffs into the lungs every 6 (six) hours as needed for wheezing.  1 Inhaler  3  . desloratadine (CLARINEX) 5 MG tablet Take 5 mg by mouth daily.        [name] levalbuterol (XOPENEX HFA) 45 MCG/ACT inhaler Inhale 1-2 puffs into the lungs every 4 (four) hours as needed for wheezing.  1 Inhaler  12  . oxyCODONE-acetaminophen (ROXICET) 5-325 MG per tablet Take 1 tablet by mouth every 6 (six) hours as needed for pain.  20 tablet  0    OBJECTIVE: BP 160/90  Pulse 104  Temp(Src) 97.5 F (36.4 C) (Oral)  Wt 157 lb (71.215 kg)  She appears well, vital signs are as noted. Ears normal.  Throat and pharynx normal erythematous.  Neck supple. No adenopathy in the neck. Nose is congested. Sinuses   TTP. The chest is clear, without wheezes or rales.  ASSESSMENT:  sinusitis and allergic rhinitis  PLAN: Given duration and progression of symptoms, will treat for bacterial sinusitis with Zpack (multiple drug allergies) Also add Flonase and Singulair. Symptomatic therapy suggested: push fluids, rest and return office visit prn if symptoms persist or worsen.  Call or return to clinic prn if these symptoms worsen or fail to improve as anticipated.

## 2011-12-29 NOTE — Patient Instructions (Signed)
Could to see you. We are adding flonase and Singulair to your medications. Please call next week with an update of your symptoms.

## 2012-02-19 ENCOUNTER — Other Ambulatory Visit: Payer: Self-pay | Admitting: *Deleted

## 2012-02-19 MED ORDER — MONTELUKAST SODIUM 10 MG PO TABS: 10.0000 mg | ORAL_TABLET | Freq: Every day | ORAL | Status: DC

## 2012-02-25 ENCOUNTER — Encounter: Payer: Self-pay | Admitting: Family Medicine

## 2012-02-25 ENCOUNTER — Ambulatory Visit (INDEPENDENT_AMBULATORY_CARE_PROVIDER_SITE_OTHER): Payer: BC Managed Care – PPO | Admitting: Family Medicine

## 2012-02-25 VITALS — BP 162/90 | HR 88 | Temp 98.1°F | Wt 157.0 lb

## 2012-02-25 DIAGNOSIS — R030 Elevated blood-pressure reading, without diagnosis of hypertension: Secondary | ICD-10-CM

## 2012-02-25 DIAGNOSIS — IMO0001 Reserved for inherently not codable concepts without codable children: Secondary | ICD-10-CM | POA: Insufficient documentation

## 2012-02-25 DIAGNOSIS — J309 Allergic rhinitis, unspecified: Secondary | ICD-10-CM

## 2012-02-25 NOTE — Patient Instructions (Addendum)
Please stop taking claritin d. Start taking claritin or allegra or zyrtec (without the D!!  No sudafed). Check your blood pressure daily and call me on Friday.

## 2012-02-25 NOTE — Progress Notes (Signed)
Subjective:    Patient ID: Linda Stevens, female    DOB: 1946-07-23, 66 y.o.   MRN: [id number]  HPI  66 yo here to check her BP.  Has severe seasonal allergies with asthma. Taking Claritin D along with flonase, mucinex, singlulair and xopenex. Symptoms have been improved on this.  Also gets white coat HTN.  Brings in her BP log from home from past 3 days- 154/88, 152/94, 153/90.  This is elevated for her, typically runs in 140s/80s-90s. She is asymptomatic- no CP, SOB, HA or blurred vision.  Patient Active Problem List  Diagnosis  . TOBACCO ABUSE  . ALLERGIC  RHINITIS  . Bronchitis, acute, with bronchospasm  . Abscess and cellulitis   Past Medical History  Diagnosis Date  . Allergic rhinitis due to pollen    No past surgical history on file. History  Substance Use Topics  . Smoking status: Current Everyday Smoker  . Smokeless tobacco: Not on file  . Alcohol Use: Not on file   No family history on file. Allergies  Allergen Reactions  . Amoxicillin-Pot Clavulanate     REACTION: gi upset  . Cefdinir     REACTION: gi  upset  . Moxifloxacin     REACTION: rash   Current Outpatient Prescriptions on File Prior to Visit  Medication Sig Dispense Refill  . dextromethorphan-guaiFENesin (MUCINEX DM) 30-600 MG per 12 hr tablet Take 1 tablet by mouth every 12 (twelve) hours.      . fluticasone (FLONASE) 50 MCG/ACT nasal spray Place 2 sprays into the nose daily.  16 g  6  . levalbuterol (XOPENEX HFA) 45 MCG/ACT inhaler Inhale 1-2 puffs into the lungs every 4 (four) hours as needed for wheezing.  1 Inhaler  12  . loratadine-pseudoephedrine (CLARITIN-D 24-HOUR) 10-240 MG per 24 hr tablet Take 1 tablet by mouth daily.      . montelukast (SINGULAIR) 10 MG tablet Take 1 tablet (10 mg total) by mouth at bedtime.  90 tablet  1   The PMH, PSH, Social History, Family History, Medications, and allergies have been reviewed in [facility], and have been updated if relevant.     Review  of Systems     Objective:   Physical Exam BP 162/90  Pulse 88  Temp 98.1 F (36.7 C)  Wt 157 lb (71.215 kg)  General:  Well-developed,well-nourished,in no acute distress; alert,appropriate and cooperative throughout examination Head:  normocephalic and atraumatic.   Eyes:  vision grossly intact, pupils equal, pupils round, and pupils reactive to light.   Ears:  R ear normal and L ear normal.   Nose:  no external deformity.   Mouth:  good dentition.   Lungs:  Normal respiratory effort, chest expands symmetrically. Lungs are clear to auscultation, no crackles or wheezes. Heart:  Normal rate and regular rhythm. S1 and S2 normal without gallop, murmur, click, rub or other extra sounds. Msk:  No deformity or scoliosis noted of thoracic or lumbar spine.   Extremities:  No clubbing, cyanosis, edema, or deformity noted with normal full range of motion of all joints.   Neurologic:  alert & oriented X3 and gait normal.   Skin:  Intact without suspicious lesions or rashes Psych:  Cognition and judgment appear intact. Alert and cooperative with normal attention span and concentration. No apparent delusions, illusions, hallucinations    Assessment & Plan:   1. Elevated BP  Deteriorated- ? Due to sudafed decongestant (and white coat HTN). Readings are less elevated at home but still  elevated. Will d/c claritin d, discussed allergy meds without sudafed.  See below. If BP remains elevated, will start low dose antihypertensive.  2. ALLERGIC  RHINITIS  Stable but need to d/c meds containing sudafed due to #1. See pt instructions. Pt to call with BP readings. The patient indicates understanding of these issues and agrees with the plan.

## 2012-02-27 ENCOUNTER — Telehealth: Payer: Self-pay | Admitting: Family Medicine

## 2012-02-27 NOTE — Telephone Encounter (Signed)
Ok that is an improvement. Please call us next week with readings.  If remains elevated, we will start BP medication.

## 2012-02-27 NOTE — Telephone Encounter (Signed)
Pt stopped claritin d yesterday.  Her BP readings yesterday were 151/85, 155/90.  Today it was 154/90.

## 2012-02-27 NOTE — Telephone Encounter (Signed)
Patient was seen on Wednesday.  Patient said you wanted her to call you,so you could speak to her about her blood pressure.  Please call patient.

## 2012-02-27 NOTE — Telephone Encounter (Signed)
Please call pt - she was supposed to verbally give [place] a BP log. Thanks.

## 2012-02-27 NOTE — Telephone Encounter (Signed)
Advised patient.  She will call back mid week.

## 2012-03-05 ENCOUNTER — Telehealth: Payer: Self-pay

## 2012-03-05 MED ORDER — HYDROCHLOROTHIAZIDE 12.5 MG PO CAPS: 12.5000 mg | ORAL_CAPSULE | Freq: Every day | ORAL | Status: DC

## 2012-03-05 NOTE — Telephone Encounter (Signed)
Rx for HCTZ sent to her pharmacy.  Please come in for follow up in 2 weeks.

## 2012-03-05 NOTE — Telephone Encounter (Signed)
Advised patient.  She said she will call back next week to schedule follow up appt.

## 2012-03-05 NOTE — Telephone Encounter (Signed)
BP 02/26/12 morning 151/85 and lunch 155/90;  02/27/12 154/90;  02/29/12 146/97 night;  03/01/12 155/95 night;   03/02/12 night 142/81;   03/04/12 morning 155/96 and   03/05/12 155/95.  Pt feels OK; no symptoms. [facility] [place].Please advise.

## 2012-03-19 ENCOUNTER — Ambulatory Visit (INDEPENDENT_AMBULATORY_CARE_PROVIDER_SITE_OTHER): Payer: BC Managed Care – PPO | Admitting: Family Medicine

## 2012-03-19 ENCOUNTER — Encounter: Payer: Self-pay | Admitting: Family Medicine

## 2012-03-19 VITALS — BP 140/80 | HR 115 | Temp 97.9°F | Ht 63.0 in | Wt 156.2 lb

## 2012-03-19 DIAGNOSIS — I10 Essential (primary) hypertension: Secondary | ICD-10-CM

## 2012-03-19 MED ORDER — HYDROCHLOROTHIAZIDE 12.5 MG PO CAPS: 12.5000 mg | ORAL_CAPSULE | Freq: Every day | ORAL | Status: DC

## 2012-03-19 NOTE — Progress Notes (Signed)
Subjective:    Patient ID: Linda Stevens, female    DOB: [birth year], 66 y.o.   MRN: [id number]  HPI  65 yo here for follow up HTN.  New diagnosis, started HCTZ 12.5 mg daily 2 weeks ago.     Also gets white coat HTN.  Brings in her BP log from home from past 3 days- 118- 122/76-81. [name]  Denies any CP, SOB, HA or blurred vision. She says she feels better than she has felt in months! Patient Active Problem List  Diagnosis  . TOBACCO ABUSE  . ALLERGIC  RHINITIS  . Bronchitis, acute, with bronchospasm  . Abscess and cellulitis  . Elevated BP  . HTN (hypertension)   Past Medical History  Diagnosis Date  . Allergic rhinitis due to pollen    No past surgical history on file. History  Substance Use Topics  . Smoking status: Current Everyday Smoker  . Smokeless tobacco: Not on file  . Alcohol Use: Not on file   No family history on file. Allergies  Allergen Reactions  . Amoxicillin-Pot Clavulanate     REACTION: gi upset  . Cefdinir     REACTION: gi  upset  . Moxifloxacin     REACTION: rash   Current Outpatient Prescriptions on File Prior to Visit  Medication Sig Dispense Refill  . dextromethorphan-guaiFENesin (MUCINEX DM) 30-600 MG per 12 hr tablet Take 1 tablet by mouth every 12 (twelve) hours.      . fluticasone (FLONASE) 50 MCG/ACT nasal spray Place 2 sprays into the nose daily.  16 g  6  . hydrochlorothiazide (MICROZIDE) 12.5 MG capsule Take 1 capsule (12.5 mg total) by mouth daily.  30 capsule  1  . levalbuterol (XOPENEX HFA) 45 MCG/ACT inhaler Inhale 1-2 puffs into the lungs every 4 (four) hours as needed for wheezing.  1 Inhaler  12  . loratadine-pseudoephedrine (CLARITIN-D 24-HOUR) 10-240 MG per 24 hr tablet Take 1 tablet by mouth daily.      . montelukast (SINGULAIR) 10 MG tablet Take 1 tablet (10 mg total) by mouth at bedtime.  90 tablet  1   The PMH, PSH, Social History, Family History, Medications, and allergies have been reviewed in [facility], and have been  updated if relevant.     Review of Systems See HPI   No dizziness Objective:   Physical Exam BP 140/80  Pulse 115  Temp 97.9 F (36.6 C) (Oral)  Ht _0  (1.6 m)  Wt 156 lb 4 oz (70.875 kg)  BMI 27.68 kg/m2  SpO2 95% BP Readings from Last 3 Encounters:  03/19/12 140/80  02/25/12 162/90  12/29/11 160/90    General:  Well-developed,well-nourished,in no acute distress; alert,appropriate and cooperative throughout examination Head:  normocephalic and atraumatic.   Eyes:  vision grossly intact, pupils equal, pupils round, and pupils reactive to light.   Ears:  R ear normal and L ear normal.   Nose:  no external deformity.   Mouth:  good dentition.   Lungs:  Normal respiratory effort, chest expands symmetrically. Lungs are clear to auscultation, no crackles or wheezes. Heart:  Normal rate and regular rhythm. S1 and S2 normal without gallop, murmur, click, rub or other extra sounds. Msk:  No deformity or scoliosis noted of thoracic or lumbar spine.   Extremities:  No clubbing, cyanosis, edema, or deformity noted with normal full range of motion of all joints.   Neurologic:  alert & oriented X3 and gait normal.   Skin:  Intact without suspicious  lesions or rashes Psych:  Cognition and judgment appear intact. Alert and cooperative with normal attention span and concentration. No apparent delusions, illusions, hallucinations    Assessment & Plan:   1. HTN (hypertension)    New- well controlled on HCTZ 12.5 mg daily. Continue current dose. Follow up prn and yearly. The patient indicates understanding of these issues and agrees with the plan.

## 2012-04-19 ENCOUNTER — Other Ambulatory Visit: Payer: Self-pay | Admitting: *Deleted

## 2012-04-19 MED ORDER — HYDROCHLOROTHIAZIDE 12.5 MG PO CAPS: 12.5000 mg | ORAL_CAPSULE | Freq: Every day | ORAL | Status: DC

## 2012-07-26 ENCOUNTER — Telehealth: Payer: Self-pay

## 2012-07-26 NOTE — Telephone Encounter (Signed)
There is nothing cheaper other than albuterol that is short acting that I am aware of.

## 2012-07-26 NOTE — Telephone Encounter (Signed)
Advised patient, she says she has already talked with her pharmacist, who told her to check with you.  I suggested she check with her insurance company to see what they prefer.

## 2012-07-26 NOTE — Telephone Encounter (Signed)
Xopenex works well; using daily due to allergy season but $50.00 copay. Pt wanted to know will she have to stay on xopenex inhaler or is there a less expensive comparable substitute. Ventalin HFA inhaler made pts mouth sore.Please advise. [facility] [place].

## 2012-07-26 NOTE — Telephone Encounter (Signed)
Advised patient, she is still asking if there are any alternatives, short acting or not, that would be less expensive.

## 2012-07-26 NOTE — Telephone Encounter (Signed)
I am unaware of any as needed inhalers that are cheaper other than albuterol.  I recommend that she ask the pharmacist as they know costs better than I do.

## 2012-09-06 ENCOUNTER — Other Ambulatory Visit: Payer: Self-pay | Admitting: Family Medicine

## 2012-11-01 ENCOUNTER — Other Ambulatory Visit: Payer: Self-pay | Admitting: Family Medicine

## 2012-12-30 ENCOUNTER — Other Ambulatory Visit: Payer: Self-pay | Admitting: *Deleted

## 2012-12-30 MED ORDER — FLUTICASONE PROPIONATE 50 MCG/ACT NA SUSP: NASAL | Status: DC

## 2013-01-02 ENCOUNTER — Other Ambulatory Visit: Payer: Self-pay | Admitting: Family Medicine

## 2013-02-23 ENCOUNTER — Ambulatory Visit (INDEPENDENT_AMBULATORY_CARE_PROVIDER_SITE_OTHER): Payer: BC Managed Care – PPO | Admitting: Family Medicine

## 2013-02-23 ENCOUNTER — Encounter: Payer: Self-pay | Admitting: Family Medicine

## 2013-02-23 VITALS — BP 120/68 | HR 106 | Temp 97.5°F | Wt 159.0 lb

## 2013-02-23 DIAGNOSIS — J309 Allergic rhinitis, unspecified: Secondary | ICD-10-CM

## 2013-02-23 DIAGNOSIS — I10 Essential (primary) hypertension: Secondary | ICD-10-CM

## 2013-02-23 LAB — BASIC METABOLIC PANEL
CO2: 30 mEq/L (ref 19–32)
Chloride: 93 mEq/L — ABNORMAL LOW (ref 96–112)
Glucose, Bld: 112 mg/dL — ABNORMAL HIGH (ref 70–99)
Potassium: 3.2 mEq/L — ABNORMAL LOW (ref 3.5–5.1)
Sodium: 136 mEq/L (ref 135–145)

## 2013-02-23 NOTE — Progress Notes (Signed)
  Subjective:    Patient ID: Linda Stevens, female    DOB: [birth year], 67 y.o.   MRN: [id number]  HPI  67 yo here for follow up.  HTN- Has been taking HCTZ 12.5 mg daily since last summer.  Also gets white coat HTN.  Brings in her BP log from home from past 3 days- 117 128/76-81.   Denies any CP, SOB, HA or blurred vision. No results found for this basename: CREATININE    Patient Active Problem List   Diagnosis Date Noted  . HTN (hypertension) 03/19/2012  . TOBACCO ABUSE 01/15/2007  . ALLERGIC  RHINITIS 01/15/2007   Past Medical History  Diagnosis Date  . Allergic rhinitis due to pollen    No past surgical history on file. History  Substance Use Topics  . Smoking status: Current Every Day Smoker  . Smokeless tobacco: Not on file  . Alcohol Use: Not on file   No family history on file. Allergies  Allergen Reactions  . Amoxicillin-Pot Clavulanate     REACTION: gi upset  . Cefdinir     REACTION: gi  upset  . Moxifloxacin     REACTION: rash   Current Outpatient Prescriptions on File Prior to Visit  Medication Sig Dispense Refill  . fluticasone (FLONASE) 50 MCG/ACT nasal spray PLACE 2 SPRAYS INTO THE NOSE DAILY.  48 g  1  . hydrochlorothiazide (MICROZIDE) 12.5 MG capsule Take 1 capsule (12.5 mg total) by mouth daily.  90 capsule  3  . montelukast (SINGULAIR) 10 MG tablet TAKE 1 TABLET AT BEDTIME  90 tablet  1  . XOPENEX HFA 45 MCG/ACT inhaler INHALE 1 TO 2 PUFFS INTO THE LUNGS EVERY 4 (FOUR) HOURS AS NEEDED FOR WHEEZING.  1 Inhaler  3   No current facility-administered medications on file prior to visit.   The PMH, PSH, Social History, Family History, Medications, and allergies have been reviewed in [facility], and have been updated if relevant.     Review of Systems See HPI   No dizziness Objective:   Physical Exam BP 120/68  Pulse 106  Temp(Src) 97.5 F (36.4 C)  Wt 159 lb (72.122 kg)  BMI 28.17 kg/m2 BP Readings from Last 3 Encounters:  02/23/13  120/68  03/19/12 140/80  02/25/12 162/90    General:  Well-developed,well-nourished,in no acute distress; alert,appropriate and cooperative throughout examination Head:  normocephalic and atraumatic.   Eyes:  vision grossly intact, pupils equal, pupils round, and pupils reactive to light.   Ears:  R ear normal and L ear normal.   Nose:  no external deformity.   Mouth:  good dentition.   Lungs:  Normal respiratory effort, chest expands symmetrically. Lungs are clear to auscultation, no crackles or wheezes. Heart:  Normal rate and regular rhythm. S1 and S2 normal without gallop, murmur, click, rub or other extra sounds. Msk:  No deformity or scoliosis noted of thoracic or lumbar spine.   Extremities:  No clubbing, cyanosis, edema, or deformity noted with normal full range of motion of all joints.   Neurologic:  alert & oriented X3 and gait normal.   Skin:  Intact without suspicious lesions or rashes Psych:  Cognition and judgment appear intact. Alert and cooperative with normal attention span and concentration. No apparent delusions, illusions, hallucinations    Assessment & Plan:   1.HTN- Well controlled on low dose HCTZ. Due for labs-check today. Orders Placed This Encounter  Procedures  . Basic Metabolic Panel

## 2013-02-23 NOTE — Patient Instructions (Addendum)
Good to see you, Linda Stevens. We will call you with your lab results.

## 2013-03-07 ENCOUNTER — Other Ambulatory Visit: Payer: Self-pay | Admitting: Family Medicine

## 2013-03-10 NOTE — Telephone Encounter (Signed)
Pt cking on refill status; spoke with Hilda Blades at [facility] [place] rx ready for pick up; pt voiced understanding.

## 2013-04-25 ENCOUNTER — Other Ambulatory Visit: Payer: Self-pay | Admitting: Family Medicine

## 2013-04-27 ENCOUNTER — Other Ambulatory Visit: Payer: Self-pay | Admitting: Family Medicine

## 2013-06-05 ENCOUNTER — Other Ambulatory Visit: Payer: Self-pay | Admitting: Family Medicine

## 2013-08-31 ENCOUNTER — Other Ambulatory Visit: Payer: Self-pay | Admitting: Family Medicine

## 2013-09-21 ENCOUNTER — Other Ambulatory Visit: Payer: Self-pay | Admitting: Family Medicine

## 2013-09-29 ENCOUNTER — Telehealth: Payer: Self-pay

## 2013-09-29 MED ORDER — ALBUTEROL SULFATE HFA 108 (90 BASE) MCG/ACT IN AERS: 2.0000 | INHALATION_SPRAY | Freq: Four times a day (QID) | RESPIRATORY_TRACT | Status: DC | PRN

## 2013-09-29 NOTE — Telephone Encounter (Signed)
Pt changed ins co. [name] will not approve Xopenex unless pt has tried ProAir or ventolin inhaler first; pt has tried one other inhaler (not sure of name but it caused mouth sore).  ProAir or ventolin step therapy program is on formulary. Pt request substitute sent [facility]. Pt request cb.

## 2013-09-29 NOTE — Telephone Encounter (Signed)
[facility]

## 2013-09-29 NOTE — Telephone Encounter (Signed)
Spoke to pt and informed her Rx has been sent to requested pharmacy

## 2013-10-15 ENCOUNTER — Other Ambulatory Visit: Payer: Self-pay | Admitting: Family Medicine

## 2013-10-17 ENCOUNTER — Other Ambulatory Visit: Payer: Self-pay | Admitting: Family Medicine

## 2013-10-18 ENCOUNTER — Other Ambulatory Visit: Payer: Self-pay | Admitting: *Deleted

## 2013-10-18 MED ORDER — FLUTICASONE PROPIONATE 50 MCG/ACT NA SUSP: NASAL | Status: DC

## 2013-10-18 MED ORDER — HYDROCHLOROTHIAZIDE 12.5 MG PO CAPS: ORAL_CAPSULE | ORAL | Status: DC

## 2014-02-20 ENCOUNTER — Encounter: Payer: Self-pay | Admitting: Internal Medicine

## 2014-02-20 ENCOUNTER — Ambulatory Visit (INDEPENDENT_AMBULATORY_CARE_PROVIDER_SITE_OTHER): Payer: Medicare HMO | Admitting: Internal Medicine

## 2014-02-20 VITALS — BP 110/68 | HR 101 | Temp 99.1°F | Wt 149.0 lb

## 2014-02-20 DIAGNOSIS — J309 Allergic rhinitis, unspecified: Secondary | ICD-10-CM

## 2014-02-20 MED ORDER — PREDNISONE 10 MG PO TABS: ORAL_TABLET | ORAL | Status: DC

## 2014-02-20 MED ORDER — HYDROCOD POLST-CHLORPHEN POLST 10-8 MG/5ML PO LQCR: 5.0000 mL | Freq: Every evening | ORAL | Status: DC | PRN

## 2014-02-20 NOTE — Patient Instructions (Addendum)
Cough, Adult  A cough is a reflex that helps clear your throat and airways. It can help heal the body or may be a reaction to an irritated airway. A cough may only last 2 or 3 weeks (acute) or may last more than 8 weeks (chronic).  CAUSES Acute cough:  Viral or bacterial infections. Chronic cough:  Infections.  Allergies.  Asthma.  Post-nasal drip.  Smoking.  Heartburn or acid reflux.  Some medicines.  Chronic lung problems (COPD).  Cancer. SYMPTOMS   Cough.  Fever.  Chest pain.  Increased breathing rate.  High-pitched whistling sound when breathing (wheezing).  Colored mucus that you cough up (sputum). TREATMENT   A bacterial cough may be treated with antibiotic medicine.  A viral cough must run its course and will not respond to antibiotics.  Your caregiver may recommend other treatments if you have a chronic cough. HOME CARE INSTRUCTIONS   Only take over-the-counter or prescription medicines for pain, discomfort, or fever as directed by your caregiver. Use cough suppressants only as directed by your caregiver.  Use a cold steam vaporizer or humidifier in your bedroom or home to help loosen secretions.  Sleep in a semi-upright position if your cough is worse at night.  Rest as needed.  Stop smoking if you smoke. SEEK IMMEDIATE MEDICAL CARE IF:   You have pus in your sputum.  Your cough starts to worsen.  You cannot control your cough with suppressants and are losing sleep.  You begin coughing up blood.  You have difficulty breathing.  You develop pain which is getting worse or is uncontrolled with medicine.  You have a fever. MAKE SURE YOU:   Understand these instructions.  Will watch your condition.  Will get help right away if you are not doing well or get worse. Document Released: 02/21/2011 Document Revised: 11/17/2011 Document Reviewed: 02/21/2011 [facility] Patient Information 2014 [place].

## 2014-02-20 NOTE — Progress Notes (Signed)
HPI  Pt presents to the clinic today with c/o sore throat, cough and chills. She reports this started yesterday. She denies pain with swallowing. She has had some associated headache and ear fullness. The cough is productive of yellow mucous. She also feels like she is wheezing. She has tried her inhaler, flonase and singulair. She does have a history of allergies. She is a current smoker.  Review of Systems      Past Medical History  Diagnosis Date  . Allergic rhinitis due to pollen     No family history on file.  History   Social History  . Marital Status: Married    Spouse Name: N/A    Number of Children: N/A  . Years of Education: N/A   Occupational History  . Not on file.   Social History Main Topics  . Smoking status: Current Every Day Smoker  . Smokeless tobacco: Not on file  . Alcohol Use: Not on file  . Drug Use: Not on file  . Sexual Activity: Not on file   Other Topics Concern  . Not on file   Social History Narrative  . No narrative on file    Allergies  Allergen Reactions  . Amoxicillin-Pot Clavulanate     REACTION: gi upset  . Cefdinir     REACTION: gi  upset  . Moxifloxacin     REACTION: rash     Constitutional: Positive headache, fatigue and fever. Denies abrupt weight changes.  HEENT:  Positive nasal congestion, sore throat. Denies eye redness, eye pain, pressure behind the eyes, ear pain, ringing in the ears, wax buildup, runny nose or bloody nose. Respiratory: Positive cough. Denies difficulty breathing or shortness of breath.  Cardiovascular: Denies chest pain, chest tightness, palpitations or swelling in the hands or feet.   No other specific complaints in a complete review of systems (except as listed in HPI above).  Objective:   BP 110/68  Pulse 101  Temp(Src) 99.1 F (37.3 C) (Oral)  Wt 149 lb (67.586 kg)  Wt Readings from Last 3 Encounters:  02/20/14 149 lb (67.586 kg)  02/23/13 159 lb (72.122 kg)  03/19/12 156 lb 4 oz  (70.875 kg)     General: Appears her stated age, well developed, well nourished in NAD. HEENT: Head: normal shape and size, maxillary sinus tenderness noted; Eyes: sclera white, no icterus, conjunctiva pink, PERRLA and EOMs intact; Ears: Tm's gray and intact, normal light reflex; Nose: mucosa pink and moist, septum midline; Throat/Mouth: + PND. Teeth present, mucosa erythematous and moist, no exudate noted, no lesions or ulcerations noted.  Neck: Mild cervical lymphadenopathy. Neck supple, trachea midline. No massses, lumps or thyromegaly present.  Cardiovascular: Normal rate and rhythm. S1,S2 noted.  No murmur, rubs or gallops noted. No JVD or BLE edema. No carotid bruits noted. Pulmonary/Chest: Normal effort and intermittent inspiratory wheeze noted. No respiratory distress. No rales or ronchi noted.      Assessment & Plan:   Allergic Rhintis:  Get some rest and drink plenty of water Do salt water gargles for the sore throat eRx for pred taper eRx for Tussionex cough syrup  RTC as needed or if symptoms persist.

## 2014-02-20 NOTE — Progress Notes (Signed)
Pre visit review using our clinic review tool, if applicable. No additional management support is needed unless otherwise documented below in the visit note.

## 2014-02-21 ENCOUNTER — Telehealth: Payer: Self-pay | Admitting: Family Medicine

## 2014-02-21 NOTE — Telephone Encounter (Signed)
Relevant patient education mailed to patient.

## 2014-02-22 ENCOUNTER — Encounter (HOSPITAL_COMMUNITY): Payer: Self-pay | Admitting: Emergency Medicine

## 2014-02-22 ENCOUNTER — Ambulatory Visit (INDEPENDENT_AMBULATORY_CARE_PROVIDER_SITE_OTHER): Payer: Medicare HMO | Admitting: Family Medicine

## 2014-02-22 ENCOUNTER — Encounter: Payer: Self-pay | Admitting: Family Medicine

## 2014-02-22 ENCOUNTER — Inpatient Hospital Stay (HOSPITAL_COMMUNITY)
Admission: EM | Admit: 2014-02-22 | Discharge: 2014-02-27 | DRG: 193 | Disposition: A | Discharge status: Home Health | Payer: Medicare HMO | Attending: Internal Medicine | Admitting: Internal Medicine

## 2014-02-22 ENCOUNTER — Emergency Department (HOSPITAL_COMMUNITY): Payer: Medicare HMO

## 2014-02-22 VITALS — BP 108/68 | HR 108 | Temp 97.7°F | Wt 147.5 lb

## 2014-02-22 DIAGNOSIS — T502X5A Adverse effect of carbonic-anhydrase inhibitors, benzothiadiazides and other diuretics, initial encounter: Secondary | ICD-10-CM | POA: Diagnosis present

## 2014-02-22 DIAGNOSIS — B370 Candidal stomatitis: Secondary | ICD-10-CM | POA: Diagnosis present

## 2014-02-22 DIAGNOSIS — T380X5A Adverse effect of glucocorticoids and synthetic analogues, initial encounter: Secondary | ICD-10-CM | POA: Diagnosis present

## 2014-02-22 DIAGNOSIS — F17200 Nicotine dependence, unspecified, uncomplicated: Secondary | ICD-10-CM | POA: Diagnosis present

## 2014-02-22 DIAGNOSIS — J4521 Mild intermittent asthma with (acute) exacerbation: Secondary | ICD-10-CM

## 2014-02-22 DIAGNOSIS — I519 Heart disease, unspecified: Secondary | ICD-10-CM | POA: Diagnosis present

## 2014-02-22 DIAGNOSIS — J45901 Unspecified asthma with (acute) exacerbation: Secondary | ICD-10-CM | POA: Diagnosis present

## 2014-02-22 DIAGNOSIS — E876 Hypokalemia: Secondary | ICD-10-CM | POA: Diagnosis present

## 2014-02-22 DIAGNOSIS — J301 Allergic rhinitis due to pollen: Secondary | ICD-10-CM | POA: Diagnosis present

## 2014-02-22 DIAGNOSIS — R0602 Shortness of breath: Secondary | ICD-10-CM | POA: Insufficient documentation

## 2014-02-22 DIAGNOSIS — I10 Essential (primary) hypertension: Secondary | ICD-10-CM | POA: Diagnosis present

## 2014-02-22 DIAGNOSIS — J189 Pneumonia, unspecified organism: Principal | ICD-10-CM | POA: Diagnosis present

## 2014-02-22 DIAGNOSIS — J9600 Acute respiratory failure, unspecified whether with hypoxia or hypercapnia: Secondary | ICD-10-CM | POA: Diagnosis present

## 2014-02-22 DIAGNOSIS — J309 Allergic rhinitis, unspecified: Secondary | ICD-10-CM

## 2014-02-22 DIAGNOSIS — R0902 Hypoxemia: Secondary | ICD-10-CM | POA: Diagnosis present

## 2014-02-22 DIAGNOSIS — J9601 Acute respiratory failure with hypoxia: Secondary | ICD-10-CM | POA: Diagnosis present

## 2014-02-22 DIAGNOSIS — Z825 Family history of asthma and other chronic lower respiratory diseases: Secondary | ICD-10-CM

## 2014-02-22 DIAGNOSIS — R197 Diarrhea, unspecified: Secondary | ICD-10-CM | POA: Diagnosis present

## 2014-02-22 HISTORY — DX: Acute respiratory failure with hypoxia: J96.01

## 2014-02-22 HISTORY — DX: Allergy status to unspecified drugs, medicaments and biological substances: Z88.9

## 2014-02-22 HISTORY — DX: Essential (primary) hypertension: I10

## 2014-02-22 LAB — TROPONIN I: Troponin I: <0.3 ng/mL (ref <0.30)

## 2014-02-22 LAB — CBC WITH DIFFERENTIAL/PLATELET
Basophils Absolute: 0 10*3/uL (ref 0.0–0.1)
Basophils Relative: 0 % (ref 0–1)
Eosinophils Absolute: 0 10*3/uL (ref 0.0–0.7)
Eosinophils Relative: 0 % (ref 0–5)
HCT: 53 % — ABNORMAL HIGH (ref 36.0–46.0)
Hemoglobin: 18.3 g/dL — ABNORMAL HIGH (ref 12.0–15.0)
LYMPHS ABS: 1.2 10*3/uL (ref 0.7–4.0)
LYMPHS PCT: 13 % (ref 12–46)
MCH: 33.8 pg (ref 26.0–34.0)
MCHC: 34.5 g/dL (ref 30.0–36.0)
MCV: 97.8 fL (ref 78.0–100.0)
Monocytes Absolute: 1 10*3/uL (ref 0.1–1.0)
Monocytes Relative: 11 % (ref 3–12)
NEUTROS PCT: 76 % (ref 43–77)
Neutro Abs: 7 10*3/uL (ref 1.7–7.7)
PLATELETS: 239 10*3/uL (ref 150–400)
RBC: 5.42 MIL/uL — ABNORMAL HIGH (ref 3.87–5.11)
RDW: 14.4 % (ref 11.5–15.5)
WBC: 9.3 10*3/uL (ref 4.0–10.5)

## 2014-02-22 LAB — COMPREHENSIVE METABOLIC PANEL
ALK PHOS: 64 U/L (ref 39–117)
ALT: 16 U/L (ref 0–35)
AST: 18 U/L (ref 0–37)
Albumin: 3.8 g/dL (ref 3.5–5.2)
BILIRUBIN TOTAL: 0.7 mg/dL (ref 0.3–1.2)
BUN: 20 mg/dL (ref 6–23)
CHLORIDE: 96 meq/L (ref 96–112)
CO2: 31 meq/L (ref 19–32)
Calcium: 9.3 mg/dL (ref 8.4–10.5)
Creatinine, Ser: 0.94 mg/dL (ref 0.50–1.10)
GFR calc Af Amer: 72 mL/min — ABNORMAL LOW (ref >90)
GFR calc non Af Amer: 62 mL/min — ABNORMAL LOW (ref >90)
Glucose, Bld: 113 mg/dL — ABNORMAL HIGH (ref 70–99)
Potassium: 3.3 mEq/L — ABNORMAL LOW (ref 3.7–5.3)
SODIUM: 142 meq/L (ref 137–147)
Total Protein: 7.2 g/dL (ref 6.0–8.3)

## 2014-02-22 LAB — STREP PNEUMONIAE URINARY ANTIGEN: STREP PNEUMO URINARY ANTIGEN: NEGATIVE

## 2014-02-22 MED ORDER — HYDROCHLOROTHIAZIDE 12.5 MG PO CAPS
12.5000 mg | ORAL_CAPSULE | Freq: Every day | ORAL | Status: DC
  Administered 2014-02-22 – 2014-02-27 (×6): 12.5 mg via ORAL
  Filled 2014-02-22 (×6): qty 1

## 2014-02-22 MED ORDER — IPRATROPIUM BROMIDE 0.02 % IN SOLN
0.5000 mg | Freq: Four times a day (QID) | RESPIRATORY_TRACT | Status: DC
  Administered 2014-02-22 – 2014-02-23 (×2): 0.5 mg via RESPIRATORY_TRACT
  Filled 2014-02-22 (×2): qty 2.5

## 2014-02-22 MED ORDER — ACETAMINOPHEN 650 MG RE SUPP: 650.0000 mg | Freq: Four times a day (QID) | RECTAL | Status: DC | PRN

## 2014-02-22 MED ORDER — SODIUM CHLORIDE 0.9 % IV BOLUS (SEPSIS)
500.0000 mL | Freq: Once | INTRAVENOUS | Status: AC
  Administered 2014-02-22: 500 mL via INTRAVENOUS

## 2014-02-22 MED ORDER — ONDANSETRON HCL 4 MG PO TABS: 4.0000 mg | ORAL_TABLET | Freq: Four times a day (QID) | ORAL | Status: DC | PRN

## 2014-02-22 MED ORDER — IPRATROPIUM-ALBUTEROL 0.5-2.5 (3) MG/3ML IN SOLN
3.0000 mL | Freq: Once | RESPIRATORY_TRACT | Status: AC
  Administered 2014-02-22: 3 mL via RESPIRATORY_TRACT
  Filled 2014-02-22: qty 3

## 2014-02-22 MED ORDER — DEXTROSE 5 % IV SOLN
500.0000 mg | INTRAVENOUS | Status: DC
  Administered 2014-02-23 – 2014-02-26 (×4): 500 mg via INTRAVENOUS
  Filled 2014-02-22 (×5): qty 500

## 2014-02-22 MED ORDER — ALUM & MAG HYDROXIDE-SIMETH 200-200-20 MG/5ML PO SUSP: 30.0000 mL | Freq: Four times a day (QID) | ORAL | Status: DC | PRN

## 2014-02-22 MED ORDER — ACETAMINOPHEN 325 MG PO TABS: 650.0000 mg | ORAL_TABLET | Freq: Four times a day (QID) | ORAL | Status: DC | PRN

## 2014-02-22 MED ORDER — SODIUM CHLORIDE 0.9 % IJ SOLN
3.0000 mL | Freq: Two times a day (BID) | INTRAMUSCULAR | Status: DC
  Administered 2014-02-22 – 2014-02-26 (×9): 3 mL via INTRAVENOUS

## 2014-02-22 MED ORDER — ZOLPIDEM TARTRATE 5 MG PO TABS
5.0000 mg | ORAL_TABLET | Freq: Every day | ORAL | Status: DC
  Administered 2014-02-22 – 2014-02-26 (×5): 5 mg via ORAL
  Filled 2014-02-22 (×5): qty 1

## 2014-02-22 MED ORDER — DEXTROSE 5 % IV SOLN
1.0000 g | Freq: Once | INTRAVENOUS | Status: AC
  Administered 2014-02-22: 1 g via INTRAVENOUS
  Filled 2014-02-22: qty 10

## 2014-02-22 MED ORDER — DEXTROSE 5 % IV SOLN
500.0000 mg | Freq: Once | INTRAVENOUS | Status: AC
  Administered 2014-02-22: 500 mg via INTRAVENOUS
  Filled 2014-02-22: qty 500

## 2014-02-22 MED ORDER — METHYLPREDNISOLONE SODIUM SUCC 125 MG IJ SOLR
60.0000 mg | Freq: Two times a day (BID) | INTRAMUSCULAR | Status: DC
  Administered 2014-02-22 – 2014-02-27 (×10): 60 mg via INTRAVENOUS
  Filled 2014-02-22 (×13): qty 0.96

## 2014-02-22 MED ORDER — ALBUTEROL SULFATE (2.5 MG/3ML) 0.083% IN NEBU: 2.5000 mg | INHALATION_SOLUTION | Freq: Four times a day (QID) | RESPIRATORY_TRACT | Status: DC | PRN

## 2014-02-22 MED ORDER — SODIUM CHLORIDE 0.9 % IV SOLN: 250.0000 mL | INTRAVENOUS | Status: DC | PRN

## 2014-02-22 MED ORDER — FLUTICASONE PROPIONATE 50 MCG/ACT NA SUSP
2.0000 | Freq: Every day | NASAL | Status: DC
  Administered 2014-02-23 – 2014-02-27 (×4): 2 via NASAL
  Filled 2014-02-22 (×2): qty 16

## 2014-02-22 MED ORDER — GUAIFENESIN ER 600 MG PO TB12
600.0000 mg | ORAL_TABLET | Freq: Two times a day (BID) | ORAL | Status: DC
  Administered 2014-02-22 – 2014-02-27 (×10): 600 mg via ORAL
  Filled 2014-02-22 (×11): qty 1

## 2014-02-22 MED ORDER — GUAIFENESIN-DM 100-10 MG/5ML PO SYRP: 5.0000 mL | ORAL_SOLUTION | ORAL | Status: DC | PRN

## 2014-02-22 MED ORDER — ONDANSETRON HCL 4 MG/2ML IJ SOLN: 4.0000 mg | Freq: Four times a day (QID) | INTRAMUSCULAR | Status: DC | PRN

## 2014-02-22 MED ORDER — ZOLPIDEM TARTRATE 10 MG PO TABS: 10.0000 mg | ORAL_TABLET | Freq: Every day | ORAL | Status: DC

## 2014-02-22 MED ORDER — MONTELUKAST SODIUM 10 MG PO TABS
10.0000 mg | ORAL_TABLET | Freq: Every day | ORAL | Status: DC
  Administered 2014-02-22 – 2014-02-26 (×5): 10 mg via ORAL
  Filled 2014-02-22 (×6): qty 1

## 2014-02-22 MED ORDER — DEXTROSE 5 % IV SOLN
1.0000 g | INTRAVENOUS | Status: DC
  Administered 2014-02-23 – 2014-02-26 (×4): 1 g via INTRAVENOUS
  Filled 2014-02-22 (×5): qty 10

## 2014-02-22 MED ORDER — SODIUM CHLORIDE 0.9 % IJ SOLN: 3.0000 mL | INTRAMUSCULAR | Status: DC | PRN

## 2014-02-22 MED ORDER — NYSTATIN 100000 UNIT/ML MT SUSP
5.0000 mL | Freq: Four times a day (QID) | OROMUCOSAL | Status: DC
  Administered 2014-02-22 – 2014-02-27 (×17): 500000 [IU] via ORAL
  Filled 2014-02-22 (×22): qty 5

## 2014-02-22 MED ORDER — NICOTINE 21 MG/24HR TD PT24
21.0000 mg | MEDICATED_PATCH | Freq: Every day | TRANSDERMAL | Status: DC | PRN
  Administered 2014-02-27: 21 mg via TRANSDERMAL
  Filled 2014-02-22: qty 1

## 2014-02-22 MED ORDER — ENOXAPARIN SODIUM 40 MG/0.4ML ~~LOC~~ SOLN
40.0000 mg | SUBCUTANEOUS | Status: DC
  Administered 2014-02-22 – 2014-02-26 (×5): 40 mg via SUBCUTANEOUS
  Filled 2014-02-22 (×6): qty 0.4

## 2014-02-22 MED ORDER — POTASSIUM CHLORIDE CRYS ER 20 MEQ PO TBCR
40.0000 meq | EXTENDED_RELEASE_TABLET | Freq: Once | ORAL | Status: AC
  Administered 2014-02-22: 40 meq via ORAL
  Filled 2014-02-22: qty 2

## 2014-02-22 MED ORDER — ALBUTEROL SULFATE (2.5 MG/3ML) 0.083% IN NEBU
2.5000 mg | INHALATION_SOLUTION | Freq: Four times a day (QID) | RESPIRATORY_TRACT | Status: DC
  Administered 2014-02-22 – 2014-02-23 (×2): 2.5 mg via RESPIRATORY_TRACT
  Filled 2014-02-22 (×2): qty 3

## 2014-02-22 NOTE — ED Provider Notes (Signed)
CSN: [id number]     Arrival date & time 02/22/14  1319 History   First MD Initiated Contact with Patient 02/22/14 1321     Chief Complaint  Patient presents with  . Shortness of Breath     (Consider location/radiation/quality/duration/timing/severity/associated sxs/prior Treatment) Patient is a 68 y.o. female presenting with shortness of breath. The history is provided by the patient.  Shortness of Breath Severity:  Mild Onset quality:  Gradual Duration:  4 days Timing:  Constant Progression:  Worsening Chronicity:  New Context: URI   Relieved by:  Nothing Worsened by:  Nothing tried Ineffective treatments: prednisone. Associated symptoms: cough and sore throat   Associated symptoms: no abdominal pain, no chest pain, no fever, no headaches, no neck pain and no vomiting   Cough:    Cough characteristics:  Productive   Sputum characteristics:  White   Severity:  Mild   Onset quality:  Sudden   Duration:  4 days   Timing:  Constant   Progression:  Worsening   Chronicity:  New   Past Medical History  Diagnosis Date  . Allergic rhinitis due to pollen    History reviewed. No pertinent past surgical history. No family history on file. History  Substance Use Topics  . Smoking status: Current Every Day Smoker -- 0.18 packs/day    Types: Cigarettes  . Smokeless tobacco: Never Used     Comment: e-cig  . Alcohol Use: No   OB History   Grav Para Term Preterm Abortions TAB SAB Ect Mult Living                 Review of Systems  Constitutional: Negative for fever and fatigue.  HENT: Positive for congestion and sore throat. Negative for drooling.   Eyes: Negative for pain.  Respiratory: Positive for cough and shortness of breath.   Cardiovascular: Negative for chest pain.  Gastrointestinal: Negative for nausea, vomiting, abdominal pain and diarrhea.  Genitourinary: Negative for dysuria and hematuria.  Musculoskeletal: Negative for back pain, gait problem and neck pain.   Skin: Negative for color change.  Neurological: Negative for dizziness and headaches.  Hematological: Negative for adenopathy.  Psychiatric/Behavioral: Negative for behavioral problems.  All other systems reviewed and are negative.     Allergies  Amoxicillin-pot clavulanate; Cefdinir; and Moxifloxacin  Home Medications   Prior to Admission medications   Medication Sig Start Date End Date Taking? Authorizing [place]val Pacer  albuterol (PROVENTIL HFA;VENTOLIN HFA) 108 (90 BASE) MCG/ACT inhaler Inhale 2 puffs into the lungs every 6 (six) hours as needed for wheezing. 09/29/13 09/29/14  Lucille Passy, MD  fluticasone (FLONASE) 50 MCG/ACT nasal spray PLACE 2 SPRAYS INTO THE NOSE DAILY. 10/18/13   Lucille Passy, MD  hydrochlorothiazide (MICROZIDE) 12.5 MG capsule TAKE ONE CAPSULE BY MOUTH EVERY DAY 10/18/13   Lucille Passy, MD  montelukast (SINGULAIR) 10 MG tablet TAKE 1 TABLET BY MOUTH AT BEDTIME 08/31/13   Webb Silversmith, NP  predniSONE (DELTASONE) 10 MG tablet Take 3 tabs on days 1-2, take 2 tabs on days 3-4, take 1 tab on days 5-6 02/20/14   Webb Silversmith, NP   BP 120/73  Pulse 106  Temp(Src) 98.1 F (36.7 C) (Oral)  Resp 20  SpO2 94% Physical Exam  Nursing note and vitals reviewed. Constitutional: She is oriented to person, place, and time. She appears well-developed and well-nourished.  HENT:  Head: Normocephalic.  Mouth/Throat: Oropharynx is clear and moist. No oropharyngeal exudate.  Eyes: Conjunctivae and EOM are normal. Pupils  are equal, round, and reactive to light.  Neck: Normal range of motion. Neck supple.  Cardiovascular: Normal rate, regular rhythm, normal heart sounds and intact distal pulses.  Exam reveals no gallop and no friction rub.   No murmur heard. Pulmonary/Chest: Effort normal. No respiratory distress. She has no wheezes.  Mildly diminished breath sounds bilaterally.   Abdominal: Soft. Bowel sounds are normal. There is no tenderness. There is no rebound and no guarding.   Musculoskeletal: Normal range of motion. She exhibits no edema and no tenderness.  Neurological: She is alert and oriented to person, place, and time.  Skin: Skin is warm and dry.  Psychiatric: She has a normal mood and affect. Her behavior is normal.    ED Course  Procedures (including critical care time) Labs Review Labs Reviewed  CBC WITH DIFFERENTIAL - Abnormal; Notable for the following:    RBC 5.42 (*)    Hemoglobin 18.3 (*)    HCT 53.0 (*)    All other components within normal limits  COMPREHENSIVE METABOLIC PANEL - Abnormal; Notable for the following:    Potassium 3.3 (*)    Glucose, Bld 113 (*)    GFR calc non Af Amer 62 (*)    GFR calc Af Amer 72 (*)    All other components within normal limits  TROPONIN I    Imaging Review Dg Chest 2 View  02/22/2014   CLINICAL DATA:  Cough. Shortness of breath. Wheezing. Bilateral ear popping. Hypertension.  EXAM: CHEST  2 VIEW  COMPARISON:  02/01/2008  FINDINGS: Borderline cardiomegaly. Mildly prominent upper zone pulmonary vasculature. Indistinct right middle lobe density medially, best seen on the lateral projection below the minor fissure, compatible with airspace opacity.  Atherosclerotic aortic arch. No pleural effusion. Mild thoracic spondylosis  IMPRESSION: *Band of abnormal airspace opacity in the right middle lobe just below the minor fissure. Differential diagnosis includes atelectasis and pneumonia. *Borderline cardiomegaly, also with a borderline appearance for pulmonary venous hypertension.   Electronically Signed   By: Sherryl Barters M.D.   On: 02/22/2014 14:21     EKG Interpretation   Date/Time:  Wednesday February 22 2014 13:34:41 EDT Ventricular Rate:  102 PR Interval:  154 QRS Duration: 80 QT Interval:  351 QTC Calculation: 457 R Axis:   92 Text Interpretation:  Sinus tachycardia Biatrial enlargement Right axis  deviation Low voltage, precordial leads Borderline repolarization  abnormality Baseline wander in  lead(s) III aVL V1 Confirmed by HARRISON   MD, [place] ([id number]) on 02/22/2014 1:52:59 PM      MDM   Final diagnoses:  CAP (community acquired pneumonia)  Hypoxia    1:49 PM 68 y.o. female who presents with cough, sore throat, shortness of breath for the last 3-4 days. She has a history of smoking. She states that she saw her PCP 2 days ago who started her on prednisone. She notes that her symptoms have worsened since then. She denies any pain or fever. She got an albuterol treatment in route. She is mildly tachycardic here but vital signs are otherwise unremarkable. She has diminished breath sounds without any obvious wheezing. Will get screening labwork and give another nebulizer treatment.  Pt now w/ faint wheeze bilaterally on exam. Found to be hypoxic on RA, O2 sat of 87%. Will admit for likely CAP.     Blanchard Kelch, MD 02/22/14 [phone]

## 2014-02-22 NOTE — ED Notes (Signed)
Bed: RESA Expected date:  Expected time:  Means of arrival:  Comments: EMS-SOB

## 2014-02-22 NOTE — Progress Notes (Signed)
Pre visit review using our clinic review tool, if applicable. No additional management support is needed unless otherwise documented below in the visit note.  Sx started Sunday.  Sick contact at home.  Cough, wheezing, ST, L ear feels plugged, B ear popping. Sinus pain x4.  She was getting SOB and exhausted just walking from the parking lot.  Possible fevers, some chills.    Hypoxic to 86% RA, up to 94% on 2L O2 via [place] and feels better on 2L.   Meds, vitals, and allergies reviewed.   ROS: See HPI.  Otherwise, noncontributory.  Appears SOB initially.  TM w/o erythema B Sinuses ttp x4.  OP wnl, MMM Neck supple, no LA rrr but tachy Global dec in BS B.  Scant wheeze.   abd soft

## 2014-02-22 NOTE — ED Notes (Signed)
Patient transported to X-ray 

## 2014-02-22 NOTE — Assessment & Plan Note (Signed)
Already on steroids orally. Now hypoxic before [place] O2 at 2L.  sats ~92-94% on 2L O2.  Advised to ER.  EMS called, in route now.  Not acceptable for home care at this point given the decompensation.  Will need ER eval.  >25 minutes spent in face to face time with patient, >50% spent in counselling or coordination of care.

## 2014-02-22 NOTE — H&P (Signed)
History and Physical:    Linda Stevens TFT:[id number] DOB: 19-May-1946 DOA: 02/22/2014  Referring physician: Dr. Aline Brochure PCP: Arnette Norris, MD   Chief Complaint: Cough and wheezing  History of Present Illness:   Linda Stevens is an 68 y.o. female with a PMH of asthma treated with when necessary bronchodilator therapy, ongoing tobacco abuse who presented to her PCP on 02/20/14 with a chief complaint of upper respiratory symptoms including cough productive of clear/white mucus, sinus pain and pressure, head congestion, chills but no documented fever and pain around the eyes. She was given a prednisone Dosepak which failed to alleviate her symptoms, and return to her PCP earlier today who instructed her to calm to the ER for further evaluation secondary to hypoxia. Upon initial evaluating Linda Stevens in the ED, the patient is noted to have a pulse oximetry of 86% on room air (increases to 92-94% on supplemental oxygen), mild tachycardia, and a chest x-ray concerning for right middle lobe community-acquired pneumonia.  ROS:   Constitutional: No fever, + chills;  Appetite normal; No weight loss, no weight gain, no fatigue.  HEENT: No blurry vision, no diplopia, + pharyngitis, no dysphagia CV: No chest pain, no palpitations, no PND, no orthopnea, no edema.  Resp: + SOB, + cough, no pleuritic pain. GI: No nausea, no vomiting, no diarrhea, no melena, no hematochezia, no constipation, no abdominal pain.  GU: No dysuria, no hematuria, no frequency, no urgency. MSK: no myalgias, no arthralgias.  Neuro:  No headache, no focal neurological deficits, no history of seizures.  Psych: No depression, no anxiety.  Endo: No heat intolerance, no cold intolerance, no polyuria, no polydipsia  Skin: No rashes, no skin lesions.  Heme: No easy bruising.  Travel history: No recent travel.   Past Medical History:   Past Medical History  Diagnosis Date  . Allergic rhinitis due to pollen   . Hypertension   . H/O  seasonal allergies     Past Surgical History:   Past Surgical History  Procedure Laterality Date  . Cholecystectomy      Open    Social History:   History   Social History  . Marital Status: Married    Spouse Name: N/A    Number of Children: N/A  . Years of Education: N/A   Occupational History  . Not on file.   Social History Main Topics  . Smoking status: Current Every Day Smoker -- 0.18 packs/day    Types: Cigarettes  . Smokeless tobacco: Never Used     Comment: e-cig  . Alcohol Use: No  . Drug Use: No  . Sexual Activity: No   Other Topics Concern  . Not on file   Social History Narrative   Married with 2 children.  Independent of ADLs.    Family history:   Family History  Problem Relation Age of Onset  . Glaucoma Brother   . Vascular Disease Father     Died of complications from surgery  . Asthma Mother     Died of asthma attack  . Dementia Mother     Allergies   Amoxicillin-pot clavulanate; Cefdinir; and Moxifloxacin  Current Medications:   Prior to Admission medications   Medication Sig Start Date End Date Taking? Authorizing Provider  albuterol (PROVENTIL HFA;VENTOLIN HFA) 108 (90 BASE) MCG/ACT inhaler Inhale 1 puff into the lungs every 6 (six) hours as needed for wheezing. 09/29/13 09/29/14 Yes Lucille Passy, MD  fluticasone (FLONASE) 50 MCG/ACT nasal spray Place 2  sprays into both nostrils daily.   Yes Historical Provider, MD  hydrochlorothiazide (MICROZIDE) 12.5 MG capsule Take 12.5 mg by mouth daily.   Yes Historical Provider, MD  hydrochlorothiazide (MICROZIDE) 12.5 MG capsule TAKE ONE CAPSULE BY MOUTH EVERY DAY 10/18/13 02/22/14 Yes Lucille Passy, MD  montelukast (SINGULAIR) 10 MG tablet Take 10 mg by mouth at bedtime.   Yes Historical Provider, MD  predniSONE (DELTASONE) 10 MG tablet Take 3 tabs (30 mg) once daily on days 1-2, take 2 tabs (20 mg) once daily on days 3-4, take 1 tab (10 mg) once daily on days 5-6. 02/20/14  Yes Webb Silversmith, NP     Physical Exam:   Filed Vitals:   02/22/14 1323 02/22/14 1327 02/22/14 1521 02/22/14 1554  BP: 120/73 120/73  114/77  Pulse: 106 106  99  Temp: 98.1 F (36.7 C) 98.1 F (36.7 C)    TempSrc: Oral Oral    Resp: _0 SpO2: 94% 94% 93% 94%     Physical Exam: Blood pressure 114/77, pulse 99, temperature 98.1 F (36.7 C), temperature source Oral, resp. rate 22, SpO2 94.00%. Gen: No acute distress. Head: Normocephalic, atraumatic. Eyes: PERRL, EOMI, sclerae nonicteric. Mouth: Oropharynx shows posterior pharyngeal erythema and a white coating to the tongue. Neck: Supple, no thyromegaly, no lymphadenopathy, no jugular venous distention. Chest: Lungs diminished with expiratory wheezes. CV: Heart sounds are mildly tachycardic with no murmurs, rubs, or gallops. Abdomen: Soft, nontender, nondistended with normal active bowel sounds. Extremities: Extremities are without clubbing, edema, or cyanosis. Skin: Warm and dry. Neuro: Alert and oriented times 3; cranial nerves II through XII grossly intact. Psych: Mood and affect normal.   Data Review:    Labs: Basic Metabolic Panel:  Recent Labs Lab 02/22/14 1353  NA 142  K 3.3*  CL 96  CO2 31  GLUCOSE 113*  BUN 20  CREATININE 0.94  CALCIUM 9.3   Liver Function Tests:  Recent Labs Lab 02/22/14 1353  AST 18  ALT 16  ALKPHOS 64  BILITOT 0.7  PROT 7.2  ALBUMIN 3.8   CBC:  Recent Labs Lab 02/22/14 1353  WBC 9.3  NEUTROABS 7.0  HGB 18.3*  HCT 53.0*  MCV 97.8  PLT 239   Cardiac Enzymes:  Recent Labs Lab 02/22/14 1353  TROPONINI <0.30   Radiographic Studies: Dg Chest 2 View  02/22/2014   CLINICAL DATA:  Cough. Shortness of breath. Wheezing. Bilateral ear popping. Hypertension.  EXAM: CHEST  2 VIEW  COMPARISON:  02/01/2008  FINDINGS: Borderline cardiomegaly. Mildly prominent upper zone pulmonary vasculature. Indistinct right middle lobe density medially, best seen on the lateral projection below the  minor fissure, compatible with airspace opacity.  Atherosclerotic aortic arch. No pleural effusion. Mild thoracic spondylosis  IMPRESSION: *Band of abnormal airspace opacity in the right middle lobe just below the minor fissure. Differential diagnosis includes atelectasis and pneumonia. *Borderline cardiomegaly, also with a borderline appearance for pulmonary venous hypertension.   Electronically Signed   By: Sherryl Barters M.D.   On: 02/22/2014 14:21    EKG: Independently reviewed. Sinus tachycardia at 102 bpm.  No ischemic changes noted.   Assessment/Plan:   Principal Problem: Asthma exacerbation/hypoxia/community-acquired pneumonia  Treat pneumonia with azithromycin/Rocephin. Check sputum cultures, strep pneumonia antigen, and urinary Legionella antigen.  Obtain blood cultures for temperature greater than 10 70F.  Treat asthma flare with Solu-Medrol 60 mg IV every 12 hours, nebulized bronchodilator therapy, and Mucinex/antitussives.  Treat hypoxia with supplemental oxygen as needed to  maintain oxygen saturations greater than 92%. Wean as tolerated.  Active Problems: Thrush  Likely from steroids. Nystatin ordered.  Tobacco abuse  Counseled. Nicotine patch when necessary.  Allergic rhinitis  Continue Singulair and Flonase.  HTN (hypertension)  Continue HCTZ.  Hypokalemia  Likely secondary to diuretic therapy.  We'll give 40 mEq of oral replacement therapy today.  DVT prophylaxis  Lovenox ordered.  Code Status: Full. Family Communication: Daughter, Linda Stevens,  at the bedside. Husband, Linda Stevens, is emergency contact and can be reached at [phone].  Linda Stevens can be reached at [phone]. Disposition Plan: Home when stable.  Time spent: 1 hour.  Artis Beggs [facility] Pager [phone] Cell: [phone]   If 7PM-7AM, please contact night-coverage [email or web address] Password [email or web address] 02/22/2014, 4:13 PM    **Disclaimer: This note was dictated with voice recognition  software. Similar sounding words can inadvertently be transcribed and this note may contain transcription errors which may not have been corrected upon publication of note.**

## 2014-02-22 NOTE — Patient Instructions (Signed)
To ER now.

## 2014-02-22 NOTE — ED Notes (Signed)
Pt coming from PCP for SOB. Pt c/o SOB with exertion, cough and wheezing that started on Sunday. Went to doctor on Monday sent home, returned today and told to come to ED. One albuterol tx in route. 4 liter [place] does not want NR.

## 2014-02-23 DIAGNOSIS — F17200 Nicotine dependence, unspecified, uncomplicated: Secondary | ICD-10-CM

## 2014-02-23 LAB — LEGIONELLA ANTIGEN, URINE: Legionella Antigen, Urine: NEGATIVE

## 2014-02-23 MED ORDER — POTASSIUM CHLORIDE CRYS ER 20 MEQ PO TBCR
20.0000 meq | EXTENDED_RELEASE_TABLET | Freq: Every day | ORAL | Status: DC
  Administered 2014-02-23 – 2014-02-27 (×5): 20 meq via ORAL
  Filled 2014-02-23 (×5): qty 1

## 2014-02-23 MED ORDER — IPRATROPIUM-ALBUTEROL 0.5-2.5 (3) MG/3ML IN SOLN
3.0000 mL | Freq: Four times a day (QID) | RESPIRATORY_TRACT | Status: DC
  Administered 2014-02-23: 3 mL via RESPIRATORY_TRACT
  Filled 2014-02-23: qty 3

## 2014-02-23 MED ORDER — IPRATROPIUM-ALBUTEROL 0.5-2.5 (3) MG/3ML IN SOLN
3.0000 mL | Freq: Four times a day (QID) | RESPIRATORY_TRACT | Status: DC
  Administered 2014-02-23 – 2014-02-27 (×12): 3 mL via RESPIRATORY_TRACT
  Filled 2014-02-23 (×13): qty 3

## 2014-02-23 NOTE — Progress Notes (Signed)
Progress Note   Linda Stevens EML:[id number] DOB: Jul 20, 1946 DOA: 02/22/2014 PCP: Arnette Norris, MD   Brief Narrative:   Linda Stevens is an 68 y.o. female with a PMH of asthma treated with PRN bronchodilator therapy, ongoing tobacco abuse who presented to her PCP on 02/20/14 with a chief complaint of upper respiratory symptoms including cough productive of clear/white mucus, sinus pain and pressure, head congestion, chills but no documented fever and pain around the eyes. She was given a prednisone Dosepak which failed to alleviate her symptoms, and returned to her PCP 02/22/14 who instructed her to come to the ER for further evaluation secondary to hypoxia. Upon initial evaluation in the ED, the patient was noted to have a pulse oximetry of 86% on room air (increased to 92-94% on supplemental oxygen), mild tachycardia, and a chest x-ray concerning for right middle lobe community-acquired pneumonia.   Assessment/Plan:   Principal Problem:  Asthma exacerbation/hypoxia/community-acquired pneumonia  Continue azithromycin/Rocephin. F/U sputum cultures, and urinary Legionella antigen. S. Pneumo neg. Obtain blood cultures for temperature greater than 10 37F.  Continue to treat asthma flare with Solu-Medrol 60 mg IV every 12 hours, nebulized bronchodilator therapy, and Mucinex/antitussives.  Treat hypoxia with supplemental oxygen as needed to maintain oxygen saturations greater than 92%. Wean as tolerated.  Oxygen saturations drop to mid 80's with activity.  Active Problems:  Thrush  Likely from steroids. Nystatin ordered.  Tobacco abuse  Counseled. Nicotine patch when necessary.  Allergic rhinitis  Continue Singulair and Flonase.  HTN (hypertension)  Continue HCTZ.  Hypokalemia  Likely secondary to diuretic therapy.  Resolved with oral supplementation.  DVT prophylaxis  Lovenox ordered.  Code Status: Full.  Family Communication: Daughter, [name], and husband, [name] at  bedside. can be reached at [phone]. [name] can be reached at [phone].  Disposition Plan: Home when stable.    IV Access:    Peripheral IV   Procedures:    None.   Medical Consultants:    None.   Other Consultants:    None.   Anti-Infectives:    Rocephin 02/22/14--->  Azithromycin 02/22/14--->  Subjective:    Linda Stevens is still short of breath.  She drops her oxygen saturations with ambulation.  + cough.  Afebrile.    Objective:    Filed Vitals:   02/22/14 1554 02/22/14 1700 02/22/14 2300 02/23/14 0600  BP: 114/77 117/75 107/68 127/76  Pulse: 99 114 97 83  Temp:  98.4 F (36.9 C) 98.3 F (36.8 C) 97.6 F (36.4 C)  TempSrc:  Oral Oral Oral  Resp: _0 Height:  _1  (1.6 m)    Weight:  68.13 kg (150 lb 3.2 oz)    SpO2: 94% 95% 92% 92%   No intake or output data in the 24 hours ending 02/23/14 0804  Exam: Gen:  NAD Cardiovascular:  RRR, No M/R/G Respiratory:  Lungs diminished with some wheezes bilaterally Gastrointestinal:  Abdomen soft, NT/ND, + BS Extremities:  No C/E/C   Data Reviewed:    Labs: Basic Metabolic Panel:  Recent Labs Lab 02/22/14 1353  NA 142  K 3.3*  CL 96  CO2 31  GLUCOSE 113*  BUN 20  CREATININE 0.94  CALCIUM 9.3   GFR Estimated Creatinine Clearance: 55.3 ml/min (by C-G formula based on Cr of 0.94). Liver Function Tests:  Recent Labs Lab 02/22/14 1353  AST 18  ALT 16  ALKPHOS 64  BILITOT 0.7  PROT 7.2  ALBUMIN 3.8  CBC:  Recent Labs Lab 02/22/14 1353  WBC 9.3  NEUTROABS 7.0  HGB 18.3*  HCT 53.0*  MCV 97.8  PLT 239   Cardiac Enzymes:  Recent Labs Lab 02/22/14 1353  TROPONINI <0.30    Microbiology No results found for this or any previous visit (from the past 240 hour(s)).   Radiographs/Studies:   Dg Chest 2 View  02/22/2014   CLINICAL DATA:  Cough. Shortness of breath. Wheezing. Bilateral ear popping. Hypertension.  EXAM: CHEST  2 VIEW  COMPARISON:   02/01/2008  FINDINGS: Borderline cardiomegaly. Mildly prominent upper zone pulmonary vasculature. Indistinct right middle lobe density medially, best seen on the lateral projection below the minor fissure, compatible with airspace opacity.  Atherosclerotic aortic arch. No pleural effusion. Mild thoracic spondylosis  IMPRESSION: *Band of abnormal airspace opacity in the right middle lobe just below the minor fissure. Differential diagnosis includes atelectasis and pneumonia. *Borderline cardiomegaly, also with a borderline appearance for pulmonary venous hypertension.   Electronically Signed   By: Sherryl Barters M.D.   On: 02/22/2014 14:21    Medications:   . albuterol  2.5 mg Nebulization Q6H  . azithromycin  500 mg Intravenous Q24H  . cefTRIAXone (ROCEPHIN)  IV  1 g Intravenous Q24H  . enoxaparin (LOVENOX) injection  40 mg Subcutaneous Q24H  . fluticasone  2 spray Each Nare Daily  . guaiFENesin  600 mg Oral BID  . hydrochlorothiazide  12.5 mg Oral Daily  . ipratropium  0.5 mg Nebulization Q6H  . methylPREDNISolone (SOLU-MEDROL) injection  60 mg Intravenous Q12H  . montelukast  10 mg Oral QHS  . nystatin  5 mL Oral QID  . sodium chloride  3 mL Intravenous Q12H  . zolpidem  5 mg Oral QHS   Continuous Infusions:   Time spent: 25 minutes.   LOS: 1 day   [name]  [facility] Pager [phone]. If unable to reach me by pager, please call my cell phone at [phone].  *Please refer to [email or web address], password [email or web address] to get updated schedule on who will round on this patient, as hospitalists switch teams weekly. If 7PM-7AM, please contact night-coverage at [email or web address], password [email or web address] for any overnight needs.  02/23/2014, 8:04 AM    **Disclaimer: This note was dictated with voice recognition software. Similar sounding words can inadvertently be transcribed and this note may contain transcription errors which may not have been corrected upon publication of note.**

## 2014-02-23 NOTE — Progress Notes (Signed)
Patient ambulated in halls, with walking oxygen saturations on room air 84%, but improved back to baseline on supplemental oxygen.  Durwin Nora RN

## 2014-02-24 MED ORDER — SACCHAROMYCES BOULARDII 250 MG PO CAPS
250.0000 mg | ORAL_CAPSULE | Freq: Two times a day (BID) | ORAL | Status: DC
  Administered 2014-02-24 – 2014-02-27 (×7): 250 mg via ORAL
  Filled 2014-02-24 (×8): qty 1

## 2014-02-24 NOTE — Progress Notes (Signed)
Progress Note   Linda BLAZEJEWSKI Stevens:[id number] DOB: [birth year] DOA: 02/22/2014 PCP: Arnette Norris, MD   Brief Narrative:   Linda Stevens is an 68 y.o. female with Linda PMH of asthma treated with PRN bronchodilator therapy, ongoing tobacco abuse who presented to her PCP on 02/20/14 with Linda chief complaint of upper respiratory symptoms including cough productive of clear/white mucus, sinus pain and pressure, head congestion, chills but no documented fever and pain around the eyes. She was given Linda prednisone Dosepak which failed to alleviate her symptoms, and returned to her PCP 02/22/14 who instructed her to come to the ER for further evaluation secondary to hypoxia. Upon initial evaluation in the ED, the patient was noted to have Linda pulse oximetry of 86% on room air (increased to 92-94% on supplemental oxygen), mild tachycardia, and Linda chest x-ray concerning for right middle lobe community-acquired pneumonia.   Assessment/Plan:   Principal Problem:  Asthma exacerbation/hypoxia/community-acquired pneumonia  Continue azithromycin/Rocephin. F/U sputum cultures. S. Pneumo & Legionella neg. Obtain blood cultures for temperature greater than 10 73F.  Continue to treat asthma flare with Solu-Medrol 60 mg IV every 12 hours, nebulized bronchodilator therapy, and Mucinex/antitussives.  Treat hypoxia with supplemental oxygen as needed to maintain oxygen saturations greater than 92%. Wean as tolerated.  Oxygen saturations drop to mid 80's with activity.  Active Problems:  Thrush  Likely from steroids. Nystatin ordered.  Tobacco abuse  Counseled. Nicotine patch when necessary.  Allergic rhinitis  Continue Singulair and Flonase.  HTN (hypertension)  Continue HCTZ.  Hypokalemia  Likely secondary to diuretic therapy.  Resolved with oral supplementation.  DVT prophylaxis  Lovenox ordered.  Code Status: Full.  Family Communication: Husband, [name] at bedside. can be reached at [phone].  [name] (daughter) can be reached at [phone].  Disposition Plan: Home when stable.    IV Access:    Peripheral IV   Procedures:    None.   Medical Consultants:    None.   Other Consultants:    None.   Anti-Infectives:    Rocephin 02/22/14--->  Azithromycin 02/22/14--->  Subjective:    AHMAYA Stevens is still short of breath, mostly with dry cough and sinus congestion.  Mildly anxious.  Afebrile.    Objective:    Filed Vitals:   02/23/14 2054 02/24/14 0234 02/24/14 0617 02/24/14 0753  BP: 119/73  121/63   Pulse: 90  87   Temp: 97.9 F (36.6 C)  98.2 F (36.8 C)   TempSrc: Oral  Oral   Resp: 22  22   Height:      Weight:      SpO2: 92% 96% 96% 90%   No intake or output data in the 24 hours ending 02/24/14 1206  Exam: Gen:  NAD Cardiovascular:  RRR, No M/R/G Respiratory:  Lungs diminished with improved air movement and improved wheezing Gastrointestinal:  Abdomen soft, NT/ND, + BS Extremities:  No C/E/C   Data Reviewed:    Labs: Basic Metabolic Panel:  Recent Labs Lab 02/22/14 1353  NA 142  K 3.3*  CL 96  CO2 31  GLUCOSE 113*  BUN 20  CREATININE 0.94  CALCIUM 9.3   GFR Estimated Creatinine Clearance: 55.3 ml/min (by C-G formula based on Cr of 0.94). Liver Function Tests:  Recent Labs Lab 02/22/14 1353  AST 18  ALT 16  ALKPHOS 64  BILITOT 0.7  PROT 7.2  ALBUMIN 3.8    CBC:  Recent Labs Lab 02/22/14 1353  WBC 9.3  NEUTROABS 7.0  HGB 18.3*  HCT 53.0*  MCV 97.8  PLT 239   Cardiac Enzymes:  Recent Labs Lab 02/22/14 1353  TROPONINI <0.30    Microbiology No results found for this or any previous visit (from the past 240 hour(s)).   Radiographs/Studies:   Dg Chest 2 View  02/22/2014   CLINICAL DATA:  Cough. Shortness of breath. Wheezing. Bilateral ear popping. Hypertension.  EXAM: CHEST  2 VIEW  COMPARISON:  02/01/2008  FINDINGS: Borderline cardiomegaly. Mildly prominent upper zone pulmonary  vasculature. Indistinct right middle lobe density medially, best seen on the lateral projection below the minor fissure, compatible with airspace opacity.  Atherosclerotic aortic arch. No pleural effusion. Mild thoracic spondylosis  IMPRESSION: *Band of abnormal airspace opacity in the right middle lobe just below the minor fissure. Differential diagnosis includes atelectasis and pneumonia. *Borderline cardiomegaly, also with Linda borderline appearance for pulmonary venous hypertension.   Electronically Signed   By: Sherryl Barters M.D.   On: 02/22/2014 14:21    Medications:   . azithromycin  500 mg Intravenous Q24H  . cefTRIAXone (ROCEPHIN)  IV  1 g Intravenous Q24H  . enoxaparin (LOVENOX) injection  40 mg Subcutaneous Q24H  . fluticasone  2 spray Each Nare Daily  . guaiFENesin  600 mg Oral BID  . hydrochlorothiazide  12.5 mg Oral Daily  . ipratropium-albuterol  3 mL Nebulization Q6H [place]  . methylPREDNISolone (SOLU-MEDROL) injection  60 mg Intravenous Q12H  . montelukast  10 mg Oral QHS  . nystatin  5 mL Oral QID  . potassium chloride  20 mEq Oral Daily  . saccharomyces boulardii  250 mg Oral BID  . sodium chloride  3 mL Intravenous Q12H  . zolpidem  5 mg Oral QHS   Continuous Infusions:   Time spent: 25 minutes.   LOS: 2 days   RAMA,CHRISTINA  [facility] Pager [phone]. If unable to reach me by pager, please call my cell phone at [phone].  *Please refer to [email or web address], password [email or web address] to get updated schedule on who will round on this patient, as hospitalists switch teams weekly. If 7PM-7AM, please contact night-coverage at [email or web address], password [email or web address] for any overnight needs.  02/24/2014, 12:06 PM    **Disclaimer: This note was dictated with voice recognition software. Similar sounding words can inadvertently be transcribed and this note may contain transcription errors which may not have been corrected upon publication of note.**

## 2014-02-25 ENCOUNTER — Inpatient Hospital Stay (HOSPITAL_COMMUNITY): Payer: Medicare HMO

## 2014-02-25 LAB — BLOOD GAS, ARTERIAL
Acid-Base Excess: 7.9 mmol/L — ABNORMAL HIGH (ref 0.0–2.0)
BICARBONATE: 32.8 meq/L — AB (ref 20.0–24.0)
Drawn by: 331471
O2 Content: 6 L/min
O2 Saturation: 93.6 %
PH ART: 7.46 — AB (ref 7.350–7.450)
PO2 ART: 60.1 mmHg — AB (ref 80.0–100.0)
Patient temperature: 98.6
TCO2: 27 mmol/L (ref 0–100)
pCO2 arterial: 46.8 mmHg — ABNORMAL HIGH (ref 35.0–45.0)

## 2014-02-25 LAB — D-DIMER, QUANTITATIVE (NOT AT ARMC): D DIMER QUANT: 0.31 ug{FEU}/mL (ref 0.00–0.48)

## 2014-02-25 LAB — PRO B NATRIURETIC PEPTIDE: PRO B NATRI PEPTIDE: 353.9 pg/mL — AB (ref 0–125)

## 2014-02-25 MED ORDER — POTASSIUM CHLORIDE CRYS ER 20 MEQ PO TBCR
40.0000 meq | EXTENDED_RELEASE_TABLET | Freq: Once | ORAL | Status: AC
  Administered 2014-02-25: 40 meq via ORAL
  Filled 2014-02-25: qty 2

## 2014-02-25 MED ORDER — FUROSEMIDE 10 MG/ML IJ SOLN
40.0000 mg | Freq: Once | INTRAMUSCULAR | Status: AC
  Administered 2014-02-25: 40 mg via INTRAVENOUS
  Filled 2014-02-25: qty 4

## 2014-02-25 NOTE — Progress Notes (Signed)
Rt took vitals off continuous pulse ox. Pt remains on 6PL[place] sats 90%, HR 81, RR 18. Pt seems SOB and no improvement in sats after neb is done.

## 2014-02-25 NOTE — Progress Notes (Addendum)
Progress Note   Linda Stevens QJJ:[id number] DOB: [birth year] DOA: 02/22/2014 PCP: Arnette Norris, MD   Brief Narrative:   Linda Stevens is an 68 y.o. female with a PMH of asthma treated with PRN bronchodilator therapy, ongoing tobacco abuse who presented to her PCP on 02/20/14 with a chief complaint of upper respiratory symptoms including cough productive of clear/white mucus, sinus pain and pressure, head congestion, chills but no documented fever and pain around the eyes. She was given a prednisone Dosepak which failed to alleviate her symptoms, and returned to her PCP 02/22/14 who instructed her to come to the ER for further evaluation secondary to hypoxia. Upon initial evaluation in the ED, the patient was noted to have a pulse oximetry of 86% on room air (increased to 92-94% on supplemental oxygen), mild tachycardia, and a chest x-ray concerning for right middle lobe community-acquired pneumonia.   Assessment/Plan:   Principal Problem:  Asthma exacerbation/hypoxia/community-acquired pneumonia  Continue azithromycin/Rocephin. Patient not producing sputum, so sputum culture not sent. S. Pneumo & Legionella neg. Obtain blood cultures for temperature greater than 10 74F.  Continue to treat asthma flare with Solu-Medrol 60 mg IV every 12 hours, nebulized bronchodilator therapy, and Mucinex/antitussives.  Treat hypoxia with supplemental oxygen as needed to maintain oxygen saturations greater than 92%. Oxygen requirement now up to 6 L, and F/U CXR clear.  ABG shows some hypoxia/hypercarbia.  Check pro-BNP and d-dimer.  If d-dimer +, get CT chest.  If pro-BNP elevated, get 2-D Echo.  Active Problems:  Thrush  Likely from steroids. Nystatin ordered.  Tobacco abuse  Counseled. Nicotine patch when necessary.  Allergic rhinitis  Continue Singulair and Flonase.  HTN (hypertension)  Continue HCTZ.  Hypokalemia  Likely secondary to diuretic therapy.  Resolved with oral  supplementation.  DVT prophylaxis  Lovenox ordered.  Code Status: Full.  Family Communication: Husband, [name] at bedside. can be reached at [phone]. [name] (daughter) can be reached at [phone].  Disposition Plan: Home when stable.    IV Access:    Peripheral IV   Procedures:    None.   Medical Consultants:    None.   Other Consultants:    None.   Anti-Infectives:    Rocephin 02/22/14--->  Azithromycin 02/22/14--->  Subjective:    Linda Stevens is still short of breath, although she tells me she feels better.  She is now using 6 L of oxygen at rest.  Mildly anxious.  Afebrile.    Objective:    Filed Vitals:   02/24/14 1807 02/24/14 2111 02/25/14 0541 02/25/14 0814  BP:  123/75 119/68   Pulse:  97 90   Temp:  98.6 F (37 C) 98 F (36.7 C)   TempSrc:  Oral Oral   Resp:  20 20   Height:      Weight:      SpO2: 92% 94% 98% 90%   No intake or output data in the 24 hours ending 02/25/14 0936  Exam: Gen:  NAD Cardiovascular:  RRR, No M/R/G Respiratory:  Lungs diminished, no wheezing Gastrointestinal:  Abdomen soft, NT/ND, + BS Extremities:  No C/E/C   Data Reviewed:    Labs: Basic Metabolic Panel:  Recent Labs Lab 02/22/14 1353  NA 142  K 3.3*  CL 96  CO2 31  GLUCOSE 113*  BUN 20  CREATININE 0.94  CALCIUM 9.3   GFR Estimated Creatinine Clearance: 55.3 ml/min (by C-G formula based on Cr of 0.94). Liver Function Tests:  Recent Labs  Lab 02/22/14 1353  AST 18  ALT 16  ALKPHOS 64  BILITOT 0.7  PROT 7.2  ALBUMIN 3.8    CBC:  Recent Labs Lab 02/22/14 1353  WBC 9.3  NEUTROABS 7.0  HGB 18.3*  HCT 53.0*  MCV 97.8  PLT 239   Cardiac Enzymes:  Recent Labs Lab 02/22/14 1353  [place] <0.30    Microbiology No results found for this or any previous visit (from the past 240 hour(s)).   Radiographs/Studies:   Dg Chest 2 View  02/22/2014   CLINICAL DATA:  Cough. Shortness of breath. Wheezing. Bilateral  ear popping. Hypertension.  EXAM: CHEST  2 VIEW  COMPARISON:  02/01/2008  FINDINGS: Borderline cardiomegaly. Mildly prominent upper zone pulmonary vasculature. Indistinct right middle lobe density medially, best seen on the lateral projection below the minor fissure, compatible with airspace opacity.  Atherosclerotic aortic arch. No pleural effusion. Mild thoracic spondylosis  IMPRESSION: *Band of abnormal airspace opacity in the right middle lobe just below the minor fissure. Differential diagnosis includes atelectasis and pneumonia. *Borderline cardiomegaly, also with a borderline appearance for pulmonary venous hypertension.   Electronically Signed   By: Sherryl Barters M.D.   On: 02/22/2014 14:21   Dg Chest Port 1 View  02/25/2014   CLINICAL DATA:  Followup pneumonia.  EXAM: PORTABLE CHEST - 1 VIEW  COMPARISON:  02/22/2014  FINDINGS: Generous heart size, stable from prior. Stable upper mediastinal contours with mild aortic tortuosity and atherosclerotic calcification. There is no evidence of consolidation, edema, effusion, or pneumothorax. The previously noted opacity in the right mid chest is not visible, but this finding was only seen in the lateral projection on the previous study.  IMPRESSION: Clear chest in the frontal projection.   Electronically Signed   By: Jorje Guild M.D.   On: 02/25/2014 09:08    Medications:   . azithromycin  500 mg Intravenous Q24H  . cefTRIAXone (ROCEPHIN)  IV  1 g Intravenous Q24H  . enoxaparin (LOVENOX) injection  40 mg Subcutaneous Q24H  . fluticasone  2 spray Each Nare Daily  . guaiFENesin  600 mg Oral BID  . hydrochlorothiazide  12.5 mg Oral Daily  . ipratropium-albuterol  3 mL Nebulization Q6H WA  . methylPREDNISolone (SOLU-MEDROL) injection  60 mg Intravenous Q12H  . montelukast  10 mg Oral QHS  . nystatin  5 mL Oral QID  . potassium chloride  20 mEq Oral Daily  . saccharomyces boulardii  250 mg Oral BID  . sodium chloride  3 mL Intravenous Q12H  .  zolpidem  5 mg Oral QHS   Continuous Infusions:   Time spent: 35 minutes with > 50% of time discussing current diagnostic test results, clinical impression and plan of care.    LOS: 3 days   [facility] Pager [phone]. If unable to reach me by pager, please call my cell phone at [phone].  *Please refer to [email or web address], password [email or web address] to get updated schedule on who will round on this patient, as hospitalists switch teams weekly. If 7PM-7AM, please contact night-coverage at [email or web address], password [email or web address] for any overnight needs.  02/25/2014, 9:36 AM    **Disclaimer: This note was dictated with voice recognition software. Similar sounding words can inadvertently be transcribed and this note may contain transcription errors which may not have been corrected upon publication of note.**

## 2014-02-25 NOTE — Progress Notes (Signed)
ABG completed on 6LPM [place] PH 7.46 PCO2 46.8 PO2 60.1 HCO3 32.8

## 2014-02-26 ENCOUNTER — Encounter (HOSPITAL_COMMUNITY): Payer: Self-pay | Admitting: Radiology

## 2014-02-26 ENCOUNTER — Inpatient Hospital Stay (HOSPITAL_COMMUNITY): Payer: Medicare HMO

## 2014-02-26 DIAGNOSIS — J9601 Acute respiratory failure with hypoxia: Secondary | ICD-10-CM | POA: Diagnosis present

## 2014-02-26 DIAGNOSIS — I369 Nonrheumatic tricuspid valve disorder, unspecified: Secondary | ICD-10-CM

## 2014-02-26 DIAGNOSIS — J9600 Acute respiratory failure, unspecified whether with hypoxia or hypercapnia: Secondary | ICD-10-CM

## 2014-02-26 HISTORY — DX: Acute respiratory failure with hypoxia: J96.01

## 2014-02-26 LAB — CBC
HCT: 50.8 % — ABNORMAL HIGH (ref 36.0–46.0)
Hemoglobin: 17.6 g/dL — ABNORMAL HIGH (ref 12.0–15.0)
MCH: 34.1 pg — ABNORMAL HIGH (ref 26.0–34.0)
MCHC: 34.6 g/dL (ref 30.0–36.0)
MCV: 98.4 fL (ref 78.0–100.0)
PLATELETS: 300 10*3/uL (ref 150–400)
RBC: 5.16 MIL/uL — AB (ref 3.87–5.11)
RDW: 14.2 % (ref 11.5–15.5)
WBC: 10.1 10*3/uL (ref 4.0–10.5)

## 2014-02-26 LAB — EXPECTORATED SPUTUM ASSESSMENT W GRAM STAIN, RFLX TO RESP C

## 2014-02-26 LAB — BASIC METABOLIC PANEL
BUN: 25 mg/dL — ABNORMAL HIGH (ref 6–23)
CALCIUM: 9.2 mg/dL (ref 8.4–10.5)
CHLORIDE: 95 meq/L — AB (ref 96–112)
CO2: 34 meq/L — AB (ref 19–32)
Creatinine, Ser: 1.03 mg/dL (ref 0.50–1.10)
GFR calc Af Amer: 65 mL/min — ABNORMAL LOW (ref >90)
GFR calc non Af Amer: 56 mL/min — ABNORMAL LOW (ref >90)
Glucose, Bld: 122 mg/dL — ABNORMAL HIGH (ref 70–99)
Potassium: 4.3 mEq/L (ref 3.7–5.3)
SODIUM: 141 meq/L (ref 137–147)

## 2014-02-26 LAB — EXPECTORATED SPUTUM ASSESSMENT W REFEX TO RESP CULTURE: Special Requests: NORMAL

## 2014-02-26 LAB — CLOSTRIDIUM DIFFICILE BY PCR: Toxigenic C. Difficile by PCR: NEGATIVE

## 2014-02-26 MED ORDER — IOHEXOL 300 MG/ML  SOLN
80.0000 mL | Freq: Once | INTRAMUSCULAR | Status: AC | PRN
  Administered 2014-02-26: 80 mL via INTRAVENOUS

## 2014-02-26 NOTE — Progress Notes (Signed)
Progress Note   Linda Stevens [id number]:[id number] DOB: February 21, 1946 DOA: 02/22/2014 PCP: Arnette Norris, MD   Brief Narrative:   Linda Stevens is an 67 y.o. female with a PMH of asthma treated with PRN bronchodilator therapy, ongoing tobacco abuse who presented to her PCP on 02/20/14 with a chief complaint of upper respiratory symptoms including cough productive of clear/white mucus, sinus pain and pressure, head congestion, chills but no documented fever and pain around the eyes. She was given a prednisone Dosepak which failed to alleviate her symptoms, and returned to her PCP 02/22/14 who instructed her to come to the ER for further evaluation secondary to hypoxia. Upon initial evaluation in the ED, the patient was noted to have a pulse oximetry of 86% on room air (increased to 92-94% on supplemental oxygen), mild tachycardia, and a chest x-ray concerning for right middle lobe community-acquired pneumonia.   Assessment/Plan:   Principal Problem:  Acute hypoxic and hypercapnic respiratory failure secondary to asthma exacerbation/community-acquired pneumonia  Continue azithromycin/Rocephin. Patient not producing sputum, so sputum culture not sent. S. Pneumo & Legionella neg. Obtain blood cultures for temperature greater than 10 35F.  Continue to treat asthma flare with Solu-Medrol 60 mg IV every 12 hours, nebulized bronchodilator therapy, and Mucinex/antitussives.  Treat hypoxia with supplemental oxygen as needed to maintain oxygen saturations greater than 92%. Oxygen requirement now up to 6 L, and F/U CXR clear.  ABG shows some hypoxia/hypercarbia.  D-dimer not elevated proBNP mildly elevated. 2-D echo pending.  Check CT chest given ongoing respiratory failure.  Active Problems:  Diarrhea  C.diff sent.    Thrush  Likely from steroids. Nystatin ordered.  Tobacco abuse  Counseled. Nicotine patch when necessary.  Allergic rhinitis  Continue Singulair and Flonase.  HTN  (hypertension)  Continue HCTZ.  Hypokalemia  Likely secondary to diuretic therapy.  Resolved with oral supplementation.  DVT prophylaxis  Lovenox ordered.  Code Status: Full.  Family Communication: Husband, [name] at bedside 02/25/14. can be reached at [phone]. [name] (daughter) can be reached at [phone].  Disposition Plan: Home when stable.    IV Access:    Peripheral IV   Procedures:    2 D Echocardiogram 02/26/14:   Medical Consultants:    None.   Other Consultants:    None.   Anti-Infectives:    Rocephin 02/22/14--->  Azithromycin 02/22/14--->  Subjective:    Linda Stevens is still short of breath, although she tells me she feels better.  She is still using 6 L of oxygen at rest.  Mildly anxious.  Afebrile.    Objective:    Filed Vitals:   02/25/14 2054 02/25/14 2104 02/26/14 0509 02/26/14 0746  BP:  106/65 119/75   Pulse:  90 81   Temp:  97.6 F (36.4 C) 98 F (36.7 C)   TempSrc:  Oral Oral   Resp:  22 20   Height:      Weight:      SpO2: 91% 90% 96% 89%    Intake/Output Summary (Last 24 hours) at 02/26/14 0803 Last data filed at 02/25/14 2156  Gross per 24 hour  Intake    303 ml  Output    450 ml  Net   -147 ml    Exam: Gen:  NAD Cardiovascular:  RRR, No M/R/G Respiratory:  Lungs diminished, no wheezing Gastrointestinal:  Abdomen soft, NT/ND, + BS Extremities:  No C/E/C   Data Reviewed:    Labs: Basic Metabolic Panel:  Recent Labs Lab  02/22/14 1353 02/26/14 0533  NA 142 141  K 3.3* 4.3  CL 96 95*  CO2 31 34*  GLUCOSE 113* 122*  BUN 20 25*  CREATININE 0.94 1.03  CALCIUM 9.3 9.2   GFR Estimated Creatinine Clearance: 50.5 ml/min (by C-G formula based on Cr of 1.03). Liver Function Tests:  Recent Labs Lab 02/22/14 1353  AST 18  ALT 16  ALKPHOS 64  BILITOT 0.7  PROT 7.2  ALBUMIN 3.8    CBC:  Recent Labs Lab 02/22/14 1353 02/26/14 0533  WBC 9.3 10.1  NEUTROABS 7.0  --   HGB 18.3* 17.6*    HCT 53.0* 50.8*  MCV 97.8 98.4  PLT 239 300   Cardiac Enzymes:  Recent Labs Lab 02/22/14 1353  TROPONINI <0.30    Microbiology No results found for this or any previous visit (from the past 240 hour(s)).   Radiographs/Studies:   Dg Chest 2 View  02/22/2014   CLINICAL DATA:  Cough. Shortness of breath. Wheezing. Bilateral ear popping. Hypertension.  EXAM: CHEST  2 VIEW  COMPARISON:  02/01/2008  FINDINGS: Borderline cardiomegaly. Mildly prominent upper zone pulmonary vasculature. Indistinct right middle lobe density medially, best seen on the lateral projection below the minor fissure, compatible with airspace opacity.  Atherosclerotic aortic arch. No pleural effusion. Mild thoracic spondylosis  IMPRESSION: *Band of abnormal airspace opacity in the right middle lobe just below the minor fissure. Differential diagnosis includes atelectasis and pneumonia. *Borderline cardiomegaly, also with a borderline appearance for pulmonary venous hypertension.   Electronically Signed   By: Sherryl Barters M.D.   On: 02/22/2014 14:21   Dg Chest Port 1 View  02/25/2014   CLINICAL DATA:  Followup pneumonia.  EXAM: PORTABLE CHEST - 1 VIEW  COMPARISON:  02/22/2014  FINDINGS: Generous heart size, stable from prior. Stable upper mediastinal contours with mild aortic tortuosity and atherosclerotic calcification. There is no evidence of consolidation, edema, effusion, or pneumothorax. The previously noted opacity in the right mid chest is not visible, but this finding was only seen in the lateral projection on the previous study.  IMPRESSION: Clear chest in the frontal projection.   Electronically Signed   By: Jorje Guild M.D.   On: 02/25/2014 09:08    Medications:   . azithromycin  500 mg Intravenous Q24H  . cefTRIAXone (ROCEPHIN)  IV  1 g Intravenous Q24H  . enoxaparin (LOVENOX) injection  40 mg Subcutaneous Q24H  . fluticasone  2 spray Each Nare Daily  . guaiFENesin  600 mg Oral BID  .  hydrochlorothiazide  12.5 mg Oral Daily  . ipratropium-albuterol  3 mL Nebulization Q6H WA  . methylPREDNISolone (SOLU-MEDROL) injection  60 mg Intravenous Q12H  . montelukast  10 mg Oral QHS  . nystatin  5 mL Oral QID  . potassium chloride  20 mEq Oral Daily  . saccharomyces boulardii  250 mg Oral BID  . sodium chloride  3 mL Intravenous Q12H  . zolpidem  5 mg Oral QHS   Continuous Infusions:   Time spent: 35 minutes with > 50% of time discussing current diagnostic test results, clinical impression and plan of care.    LOS: 4 days   [facility] Pager [phone]. If unable to reach me by pager, please call my cell phone at [phone].  *Please refer to [email or web address], password [email or web address] to get updated schedule on who will round on this patient, as hospitalists switch teams weekly. If 7PM-7AM, please contact night-coverage at [email or web address], password [facility] for any overnight  needs.  02/26/2014, 8:03 AM    **Disclaimer: This note was dictated with voice recognition software. Similar sounding words can inadvertently be transcribed and this note may contain transcription errors which may not have been corrected upon publication of note.**

## 2014-02-26 NOTE — Progress Notes (Signed)
Echocardiogram 2D Echocardiogram has been performed.  Linda Stevens 02/26/2014, 10:33 AM

## 2014-02-27 ENCOUNTER — Encounter (HOSPITAL_COMMUNITY): Payer: Self-pay | Admitting: Internal Medicine

## 2014-02-27 DIAGNOSIS — I519 Heart disease, unspecified: Secondary | ICD-10-CM | POA: Diagnosis present

## 2014-02-27 MED ORDER — FLUTICASONE-SALMETEROL 250-50 MCG/DOSE IN AEPB: 1.0000 | INHALATION_SPRAY | Freq: Two times a day (BID) | RESPIRATORY_TRACT | Status: DC

## 2014-02-27 MED ORDER — PREDNISONE (PAK) 10 MG PO TABS: ORAL_TABLET | ORAL | Status: DC

## 2014-02-27 MED ORDER — ALBUTEROL SULFATE HFA 108 (90 BASE) MCG/ACT IN AERS: 1.0000 | INHALATION_SPRAY | Freq: Four times a day (QID) | RESPIRATORY_TRACT | Status: DC | PRN

## 2014-02-27 MED ORDER — POTASSIUM CHLORIDE CRYS ER 20 MEQ PO TBCR: 20.0000 meq | EXTENDED_RELEASE_TABLET | Freq: Every day | ORAL | Status: DC

## 2014-02-27 MED ORDER — NICOTINE 21 MG/24HR TD PT24: 21.0000 mg | MEDICATED_PATCH | Freq: Every day | TRANSDERMAL | Status: DC | PRN

## 2014-02-27 MED ORDER — ACETAMINOPHEN-CODEINE 300-30 MG PO TABS: 1.0000 | ORAL_TABLET | ORAL | Status: DC | PRN

## 2014-02-27 MED ORDER — CEFUROXIME AXETIL 500 MG PO TABS: 500.0000 mg | ORAL_TABLET | Freq: Two times a day (BID) | ORAL | Status: DC

## 2014-02-27 NOTE — Progress Notes (Addendum)
nt walked with pt and Dr. Abbott Pao on  6l. Pt at  95 sitting off o2 walking in hall 02 dropped to 84  And back to room, pt on lower rate of 02 at 2.5 liters 02 back to 89. Dr. Madaline Brilliant with pt o2 at 88 or 89 and above. Pt at 85 now.

## 2014-02-27 NOTE — Care Management Note (Signed)
    Page 1 of 1   02/27/2014     1:42:48 PM CARE MANAGEMENT NOTE 02/27/2014  Patient:  Linda Stevens, Linda Stevens   Account Number:  [id number]  Date Initiated:  02/27/2014  Documentation initiated by:  [facility]  Subjective/Objective Assessment:   68 year old female admitted with CAP.     Action/Plan:   From home with spouse.   Anticipated DC Date:  02/27/2014   Anticipated DC Plan:  [place]  CM consult      Choice offered to / List presented to:  C-1 Patient   DME arranged  [facility]      DME agency  [place] arranged  HH-1 RN      [place].   Status of service:  Completed, signed off Medicare Important Message given?  YES (If response is "NO", the following Medicare IM given date fields will be blank) Date Medicare IM given:  02/27/2014 Date Additional Medicare IM given:    Discharge Disposition:  [place]  Per UR Regulation:  Reviewed for med. necessity/level of care/duration of stay  If discussed at [place] of Stay Meetings, dates discussed:    Comments:

## 2014-02-27 NOTE — Discharge Instructions (Signed)
Asthma, Acute Bronchospasm Acute bronchospasm caused by asthma is also referred to as an asthma attack. Bronchospasm means your air passages become narrowed. The narrowing is caused by inflammation and tightening of the muscles in the air tubes (bronchi) in your lungs. This can make it hard to breathe or cause you to wheeze and cough. CAUSES Possible triggers are:  Animal dander from the skin, hair, or feathers of animals.  Dust mites contained in house dust.  Cockroaches.  Pollen from trees or grass.  Mold.  Cigarette or tobacco smoke.  Air pollutants such as dust, household cleaners, hair sprays, aerosol sprays, paint fumes, strong chemicals, or strong odors.  Cold air or weather changes. Cold air may trigger inflammation. Winds increase molds and pollens in the air.  Strong emotions such as crying or laughing hard.  Stress.  Certain medicines such as aspirin or beta-blockers.  Sulfites in foods and drinks, such as dried fruits and wine.  Infections or inflammatory conditions, such as a flu, cold, or inflammation of the nasal membranes (rhinitis).  Gastroesophageal reflux disease (GERD). GERD is a condition where stomach acid backs up into your esophagus.  Exercise or strenuous activity. SIGNS AND SYMPTOMS   Wheezing.  Excessive coughing, particularly at night.  Chest tightness.  Shortness of breath. DIAGNOSIS  Your health care provider will ask you about your medical history and perform a physical exam. A chest X-ray or blood testing may be performed to look for other causes of your symptoms or other conditions that may have triggered your asthma attack. TREATMENT  Treatment is aimed at reducing inflammation and opening up the airways in your lungs. Most asthma attacks are treated with inhaled medicines. These include quick relief or rescue medicines (such as bronchodilators) and controller medicines (such as inhaled corticosteroids). These medicines are sometimes  given through an inhaler or a nebulizer. Systemic steroid medicine taken by mouth or given through an IV tube also can be used to reduce the inflammation when an attack is moderate or severe. Antibiotic medicines are only used if a bacterial infection is present.  HOME CARE INSTRUCTIONS   Rest.  Drink plenty of liquids. This helps the mucus to remain thin and be easily coughed up. Only use caffeine in moderation and do not use alcohol until you have recovered from your illness.  Do not smoke. Avoid being exposed to secondhand smoke.  You play a critical role in keeping yourself in good health. Avoid exposure to things that cause you to wheeze or to have breathing problems.  Keep your medicines up-to-date and available. Carefully follow your health care provider's treatment plan.  Take your medicine exactly as prescribed.  When pollen or pollution is bad, keep windows closed and use an air conditioner or go to places with air conditioning.  Asthma requires careful medical care. See your health care provider for a follow-up as advised. If you are more than [redacted] weeks pregnant and you were prescribed any new medicines, let your obstetrician know about the visit and how you are doing. Follow up with your health care provider as directed.  After you have recovered from your asthma attack, make an appointment with your outpatient doctor to talk about ways to reduce the likelihood of future attacks. If you do not have a doctor who manages your asthma, make an appointment with a primary care doctor to discuss your asthma. SEEK IMMEDIATE MEDICAL CARE IF:   You are getting worse.  You have trouble breathing. If severe, call your local  emergency services (911 in the [place]  You develop chest pain or discomfort.  You are vomiting.  You are not able to keep fluids down.  You are coughing up yellow, green, brown, or bloody sputum.  You have a fever and your symptoms suddenly get worse.  You have  trouble swallowing. MAKE SURE YOU:   Understand these instructions.  Will watch your condition.  Will get help right away if you are not doing well or get worse. Document Released: 12/10/2006 Document Revised: 08/30/2013 Document Reviewed: 03/02/2013 [facility] Patient Information 2015 [place], [place]. This information is not intended to replace advice given to you by your health care provider. Make sure you discuss any questions you have with your health care provider.  Hypoxemia Hypoxemia occurs when your blood does not contain enough oxygen. The body cannot work well when it does not have enough oxygen because every part of your body needs oxygen. Oxygen travels to all parts of the body through your blood. Hypoxemia can develop suddenly or can come on slowly. CAUSES Some common causes of hypoxemia include:  Long-term (chronic) lung diseases, such as chronic obstructive pulmonary disease (COPD) or interstitial lung disease.  Disorders that affect breathing at night, such as sleep apnea.  Fluid buildup in your lungs (pulmonary edema).  Lung infection (pneumonia).  Lung or throat cancer.  Abnormal blood flow that bypasses the lungs (shunt).  Certain diseasesthat affect nerves or muscles.  A collapsed lung (pneumothorax).  A blood clot in the lungs (pulmonary embolus).  Certain types of heart disease.  Slow or shallow breathing (hypoventilation).  Certain medicines.  High altitudes.  Toxic chemicals and gases. SIGNS AND SYMPTOMS Not everyone who has hypoxemia will develop symptoms. If the hypoxemia developed quickly, you will likely have symptoms such as shortness of breath. If the hypoxemia came on slowly over months or years, you may not notice any symptoms. Symptoms can include:  Shortness of breath (dyspnea).  Bluish color of the skin, lips, or nail beds.  Breathing that is fast, noisy, or shallow.  A fast heartbeat.  Feeling tired or sleepy.  Being  confused or feeling anxious. DIAGNOSIS To determine if you have hypoxemia, your health care provider may perform:  A physical exam.  Blood tests.  A pulse oximetry. A sensor will be put on your finger, toe, or earlobe to measure the percent of oxygen in your blood. TREATMENT You will likely be treated with oxygen therapy. Depending on the cause of your hypoxemia, you may need oxygen for a short time (weeks or months), or you may need it indefinitely. Your health care provider may also recommend other therapies to treat the underlying cause of your hypoxemia. HOME CARE INSTRUCTIONS  Only take over-the-counter or prescription medicines as directed by your health care provider.  Follow oxygen safety measures if you are on oxygen therapy. These may include:  Always having a backup supply of oxygen.  Not allowing anyone to smoke around oxygen.  Handling the oxygen tanks carefully and as instructed.  If you smoke, quit. Stay away from people who smoke.  Follow up with your health care provider as directed. SEEK MEDICAL CARE IF:  You have any concerns about your oxygen therapy.  You still have trouble breathing.  You become short of breath when you exercise.  You are tired when you wake up.  You have a headache when you wake up. SEEK IMMEDIATE MEDICAL CARE IF:   Your breathing gets worse.  You have new shortness of breath  with normal activity.  You have a bluish color of the skin, lips, or nail beds.  You have confusion or cloudy thinking.  You cough up dark mucus.  You have chest pain.  You have a fever. MAKE SURE YOU:  Understand these instructions.  Will watch your condition.  Will get help right away if you are not doing well or get worse. Document Released: 03/10/2011 Document Revised: 08/30/2013 Document Reviewed: 03/24/2013 [facility] Patient Information 2015 [place], [place]. This information is not intended to replace advice given to you by your health care  provider. Make sure you discuss any questions you have with your health care provider.

## 2014-02-27 NOTE — Progress Notes (Signed)
SATURATION QUALIFICATIONS: (This note is used to comply with regulatory documentation for home oxygen)  Patient Saturations on Room Air at Rest = 95%  Patient Saturations on Room Air while Ambulating = 84-86%  Patient Saturations on 2.5 Liters of oxygen while Ambulating = 88-89%  Please briefly explain why patient needs home oxygen:

## 2014-02-27 NOTE — Plan of Care (Signed)
Problem: Discharge Progression Outcomes Goal: Barriers To Progression Addressed/Resolved Outcome: Adequate for Discharge Pt will be going home with oxygen Goal: O2 sats at patient's baseline Outcome: Adequate for Discharge Pt is going home with Oxygen

## 2014-02-27 NOTE — Discharge Summary (Signed)
Physician Discharge Summary  Linda Stevens KPT:[id number] DOB: [birth year] DOA: 02/22/2014  PCP: Arnette Norris, MD  Admit date: 02/22/2014 Discharge date: 02/27/2014   Recommendations for Outpatient Follow-Up:   1. Home health RN set up to assist with teaching regarding disease management/medication changes. 2. Home oxygen set up. 3. F/U with Pulmonologist arranged (will call with appt).  Discharge Diagnosis:   Principal Problem: 1.   Acute hypoxic and hypercarbic respiratory failure secondary to asthma exacerbation and CAP (community acquired pneumonia) Active Problems:    TOBACCO ABUSE    HTN (hypertension)    Hypoxia    Hypokalemia    Acute asthma flare    Thrush    Acute respiratory failure with hypoxia and hypercapnea   Discharge Condition: Stable.  Diet recommendation: Low sodium, heart healthy.     History of Present Illness:   Linda Stevens is an 68 y.o. female with a PMH of asthma treated with PRN bronchodilator therapy, ongoing tobacco abuse who presented to her PCP on 02/20/14 with a chief complaint of upper respiratory symptoms including cough productive of clear/white mucus, sinus pain and pressure, head congestion, chills but no documented fever and pain around the eyes. She was given a prednisone Dosepak which failed to alleviate her symptoms, and returned to her PCP 02/22/14 who instructed her to come to the ER for further evaluation secondary to hypoxia. Upon initial evaluation in the ED, the patient was noted to have a pulse oximetry of 86% on room air (increased to 92-94% on supplemental oxygen), mild tachycardia, and a chest x-ray concerning for right middle lobe community-acquired pneumonia.  Hospital Course by Problem:   Principal Problem:  Acute hypoxic and hypercapnic respiratory failure secondary to asthma exacerbation/community-acquired pneumonia  Treated with empiric azithromycin/Rocephin. Patient not producing sputum, so sputum culture  not sent. S. Pneumo & Legionella neg. D/C home on Ceftin through 03/02/14.  Treated with Solu-Medrol 60 mg IV every 12 hours, nebulized bronchodilator therapy, and Mucinex/antitussives. D/C home on Advair, prednisone dose pack, Tylenol #3 PRN cough. F/U CXR clear. ABG showed some hypoxia/hypercarbia. D-dimer not elevated, ProBNP mildly elevated (given a dose of Lasix). 2-D echo relatively normal. CT chest negative except for an enlarged lymph node and RML bronchiectasis. Send home on home oxygen with [facility] RN to assess her periodically. Pulmonology follow up arranged.  Active Problems:  Diarrhea  C.diff negative.  Thrush  Likely from steroids. Treated with Nystatin.  Tobacco abuse  Counseled.   Allergic rhinitis  Continue Singulair and Flonase.  HTN (hypertension)  Continue HCTZ.  Hypokalemia  Likely secondary to diuretic therapy.  Resolved with oral supplementation.    Procedures:   2 D Echo 02/26/14:   Impressions:  - Technically difficult; vigorous LV function; grade 1 diastolic dysfunction; significant RVH and mildly reduced RV function; mild TR but signal inadequate to estimate pulmonary pressures.     Medical Consultants:    None.   Discharge Exam:   Filed Vitals:   02/27/14 0458  BP: 105/64  Pulse: 70  Temp: 98.2 F (36.8 C)  Resp: 20   Filed Vitals:   02/26/14 2055 02/27/14 0458 02/27/14 0954 02/27/14 1027  BP:  105/64    Pulse:  70    Temp:  98.2 F (36.8 C)    TempSrc:  Oral    Resp:  20    Height:      Weight:      SpO2: 92% 95% 95% 88%    Gen:  NAD Cardiovascular:  RRR, No M/R/G Respiratory: Lungs diminished Gastrointestinal: Abdomen soft, NT/ND with normal active bowel sounds. Extremities: No C/E/C    Discharge Instructions:       Discharge Instructions   (HEART FAILURE PATIENTS) Call MD:  Anytime you have any of the following symptoms: 1) 3 pound weight gain in 24 hours or 5 pounds in 1 week 2) shortness of breath, with or  without a dry hacking cough 3) swelling in the hands, feet or stomach 4) if you have to sleep on extra pillows at night in order to breathe.    Complete by:  As directed      Call MD for:  difficulty breathing, headache or visual disturbances    Complete by:  As directed      Call MD for:  extreme fatigue    Complete by:  As directed      Call MD for:  persistant nausea and vomiting    Complete by:  As directed      Call MD for:  temperature >100.4    Complete by:  As directed      Diet - low sodium heart healthy    Complete by:  As directed      Discharge instructions    Complete by:  As directed   You will be set up for home oxygen use.    A lung specialist has been consulted for outpatient follow up.  [facility] will call you with an appointment time.   Use the Advair inhaler twice a day religiously (maintenance medication).  Use the Albuterol inhaler as needed for wheezing/shortness of breath.  You were cared for by Dr. Jacquelynn Cree  (a hospitalist) during your hospital stay. If you have any questions about your discharge medications or the care you received while you were in the hospital after you are discharged, you can call the unit and ask to speak with the hospitalist on call if the hospitalist that took care of you is not available. Once you are discharged, your primary care physician will handle any further medical issues. Please note that NO REFILLS for any discharge medications will be authorized once you are discharged, as it is imperative that you return to your primary care physician (or establish a relationship with a primary care physician if you do not have one) for your aftercare needs so that they can reassess your need for medications and monitor your lab values.  Any outstanding tests can be reviewed by your PCP at your follow up visit.  It is also important to review any medicine changes with your PCP.  Please bring these d/c instructions with you to your next  visit so your physician can review these changes with you.     Face-to-face encounter (required for Medicare/Medicaid patients)    Complete by:  As directed   I RAMA,CHRISTINA certify that this patient is under my care and that I, or a nurse practitioner or physician's assistant working with me, had a face-to-face encounter that meets the physician face-to-face encounter requirements with this patient on 02/27/2014. The encounter with the patient was in whole, or in part for the following medical condition(s) which is the primary reason for home health care (List medical condition): Acute hypoxic respiratory failure, sending home w/ oxygen.  Please monitor oxygen saturations with vital sign checks.  Please teach about COPD, how to use maintenance versus rescue medications.  The encounter with the patient was in whole, or in part, for the following medical condition,  which is the primary reason for home health care:  Acute hypoxic respiratory failure, probable COPD  I certify that, based on my findings, the following services are medically necessary home health services:  Nursing  My clinical findings support the need for the above services:  Shortness of breath with activity  Further, I certify that my clinical findings support that this patient is homebound due to:  Shortness of Breath with activity  Reason for Medically Necessary Home Health Services:   Skilled Nursing- Changes in Medication/Medication Management Skilled Nursing- Skilled Assessment/Observation Skilled Nursing- Teaching of Disease Process/Symptom Management       For home use only DME oxygen    Complete by:  As directed   Mode or (Route):  Nasal cannula  Liters per Minute:  3  Frequency:  Continuous (stationary and portable oxygen unit needed)  Oxygen conserving device:  Yes  Oxygen delivery system:  Gas     Home Health    Complete by:  As directed   To provide the following care/treatments:   RN Respiratory Care        Increase activity slowly    Complete by:  As directed             Medication List    STOP taking these medications       predniSONE 10 MG tablet  Commonly known as:  DELTASONE  Replaced by:  predniSONE 10 MG tablet      TAKE these medications       Acetaminophen-Codeine 300-30 MG per tablet  Take 1 tablet by mouth every 4 (four) hours as needed (Body aches with cough).     albuterol 108 (90 BASE) MCG/ACT inhaler  Commonly known as:  PROVENTIL HFA;VENTOLIN HFA  Inhale 1 puff into the lungs every 6 (six) hours as needed for wheezing.     cefUROXime 500 MG tablet  Commonly known as:  CEFTIN  Take 1 tablet (500 mg total) by mouth 2 (two) times daily with a meal.     fluticasone 50 MCG/ACT nasal spray  Commonly known as:  FLONASE  Place 2 sprays into both nostrils daily.     Fluticasone-Salmeterol 250-50 MCG/DOSE Aepb  Commonly known as:  ADVAIR DISKUS  Inhale 1 puff into the lungs 2 (two) times daily.     MICROZIDE 12.5 MG capsule  Generic drug:  hydrochlorothiazide  Take 12.5 mg by mouth daily.     montelukast 10 MG tablet  Commonly known as:  SINGULAIR  Take 10 mg by mouth at bedtime.     potassium chloride SA 20 MEQ tablet  Commonly known as:  K-DUR,KLOR-CON  Take 1 tablet (20 mEq total) by mouth daily.     predniSONE 10 MG tablet  Commonly known as:  STERAPRED UNI-PAK  Take as directed.       Follow-up Information   Schedule an appointment as soon as possible for a visit with Arnette Norris, MD. (If symptoms worsen)    Specialty:  Family Medicine   Contact information:   [address] [place] [address] [phone]       Follow up with [facility]. (Will call you with an appt time.)    Specialty:  Pulmonology   Contact information:   [address] [place] [address] [phone]       The results of significant diagnostics from this hospitalization (including imaging, microbiology, ancillary and laboratory) are listed  below for reference.     Significant Diagnostic Studies:  Radiographs: Dg Chest 2 View  02/22/2014   CLINICAL DATA:  Cough. Shortness of breath. Wheezing. Bilateral ear popping. Hypertension.  EXAM: CHEST  2 VIEW  COMPARISON:  02/01/2008  FINDINGS: Borderline cardiomegaly. Mildly prominent upper zone pulmonary vasculature. Indistinct right middle lobe density medially, best seen on the lateral projection below the minor fissure, compatible with airspace opacity.  Atherosclerotic aortic arch. No pleural effusion. Mild thoracic spondylosis  IMPRESSION: *Band of abnormal airspace opacity in the right middle lobe just below the minor fissure. Differential diagnosis includes atelectasis and pneumonia. *Borderline cardiomegaly, also with a borderline appearance for pulmonary venous hypertension.   Electronically Signed   By: Sherryl Barters M.D.   On: 02/22/2014 14:21   Ct Chest W Contrast  02/26/2014   CLINICAL DATA:  Hypoxic  EXAM: CT CHEST WITH CONTRAST  TECHNIQUE: Multidetector CT imaging of the chest was performed during intravenous contrast administration.  CONTRAST:  69m OMNIPAQUE IOHEXOL 300 MG/ML  SOLN  COMPARISON:  None.  FINDINGS: Thoracic inlet is unremarkable.  A 1.2 cm of the length in the precarinal region. Remaining mediastinal lymph nodes are sub is a cm in size. No evidence of mediastinal mass.  Atherosclerotic calcification in the aorta without a thoracic aortic aneurysm.  A small hiatal hernia is appreciated. Fluid is appreciated within a mildly distended, distal esophagus.  Mild areas of linear density in along the minor fissure are on the right and within the left lung base.  The central airways are patent.  The lungs otherwise clear.  Patient is status post cholecystectomy. Visualized upper abdominal viscera are unremarkable.  Multilevel spondylosis within the spine no aggressive appearing osseous lesions.  IMPRESSION: Mildly prominent precarinal lymph node without further evidence of  mediastinal masses or adenopathy.  Scarring versus atelectasis along the minor fissure on the right and left lung base.  Otherwise no evidence of thoracic pathology.  Small hiatal hernia.   Electronically Signed   By: HMargaree MackintoshM.D.   On: 02/26/2014 12:20   Dg Chest Port 1 View  02/25/2014   CLINICAL DATA:  Followup pneumonia.  EXAM: PORTABLE CHEST - 1 VIEW  COMPARISON:  02/22/2014  FINDINGS: Generous heart size, stable from prior. Stable upper mediastinal contours with mild aortic tortuosity and atherosclerotic calcification. There is no evidence of consolidation, edema, effusion, or pneumothorax. The previously noted opacity in the right mid chest is not visible, but this finding was only seen in the lateral projection on the previous study.  IMPRESSION: Clear chest in the frontal projection.   Electronically Signed   By: JJorje GuildM.D.   On: 02/25/2014 09:08    Labs:  Basic Metabolic Panel:  Recent Labs Lab 02/22/14 1353 02/26/14 0533  NA 142 141  K 3.3* 4.3  CL 96 95*  CO2 31 34*  GLUCOSE 113* 122*  BUN 20 25*  CREATININE 0.94 1.03  CALCIUM 9.3 9.2   GFR Estimated Creatinine Clearance: 50.5 ml/min (by C-G formula based on Cr of 1.03). Liver Function Tests:  Recent Labs Lab 02/22/14 1353  AST 18  ALT 16  ALKPHOS 64  BILITOT 0.7  PROT 7.2  ALBUMIN 3.8    CBC:  Recent Labs Lab 02/22/14 1353 02/26/14 0533  WBC 9.3 10.1  NEUTROABS 7.0  --   HGB 18.3* 17.6*  HCT 53.0* 50.8*  MCV 97.8 98.4  PLT 239 300   Cardiac Enzymes:  Recent Labs Lab 02/22/14 1353  TROPONINI <0.30   D-Dimer  Recent Labs  02/25/14 06256  DDIMER 0.31   Microbiology Recent Results (from the past 240 hour(s))  CLOSTRIDIUM DIFFICILE BY PCR     Status: None   Collection Time    02/26/14  8:11 AM      Result Value Ref Range Status   C difficile by pcr NEGATIVE  NEGATIVE Final   Comment: Performed at [place], EXPECTORATED SPUTUM-ASSESSMENT     Status:  None   Collection Time    02/26/14  8:12 AM      Result Value Ref Range Status   Specimen Description SPUTUM   Final   Special Requests Normal   Final   Sputum evaluation     Final   Value: MICROSCOPIC FINDINGS SUGGEST THAT THIS SPECIMEN IS NOT REPRESENTATIVE OF LOWER RESPIRATORY SECRETIONS. PLEASE RECOLLECT.     NOTIFIED ATTENDING NURSE AT 0721 ON [id number] BY HOOKER,B   Report Status 02/26/2014 FINAL   Final    Time coordinating discharge: 35 minutes.  Signed:  RAMA,CHRISTINA  Pager [phone] [facility] 02/27/2014, 11:05 AM

## 2014-03-02 ENCOUNTER — Telehealth: Payer: Self-pay | Admitting: Family Medicine

## 2014-03-02 MED ORDER — FLUCONAZOLE 150 MG PO TABS: 150.0000 mg | ORAL_TABLET | Freq: Once | ORAL | Status: DC

## 2014-03-02 NOTE — Telephone Encounter (Signed)
Spoke to pt and informed her Rx has been sent to requested pharmacy and advised her per Dr Deborra Medina. Pt verbally expressed understanding but states that she is unable to schedule hospital f/u at this time; states that she will contact us to schedule.

## 2014-03-02 NOTE — Telephone Encounter (Signed)
Pt called in this morning and says she was admitted into the hospital [facility]) on 06/17 and was discharged 06/22 w/pneumonia.  She was given antibiotics and they discussed w/her the possibility of diarrhea and yeast infection. Pt says she has had diarrhea the entire time she was on the antibiotic (Ceftin).  She completed her last dose of antibiotics yesterday evening, but still has diarrhea today. Pt also feels she has a yeast infection now.  Pt would like to know if you could call in the pill for her yeast infection and also advise what, if anything should be done about the diarrhea. Please call to advise patient. Thank you.

## 2014-03-02 NOTE — Telephone Encounter (Signed)
She needs to be seen for diarrhea and hospital follow up.  Please schedule appt.  Will send in rx for diflucan for yeast infection.  Eat yogurt, take OTC probiotics for diarrhea.  I hope she feels better.

## 2014-03-06 ENCOUNTER — Other Ambulatory Visit: Payer: Self-pay | Admitting: Internal Medicine

## 2014-03-06 DIAGNOSIS — J301 Allergic rhinitis due to pollen: Secondary | ICD-10-CM

## 2014-03-09 ENCOUNTER — Ambulatory Visit (INDEPENDENT_AMBULATORY_CARE_PROVIDER_SITE_OTHER): Payer: Medicare HMO | Admitting: Internal Medicine

## 2014-03-09 ENCOUNTER — Encounter: Payer: Self-pay | Admitting: Internal Medicine

## 2014-03-09 VITALS — BP 102/62 | HR 78 | Temp 97.8°F | Ht 63.0 in | Wt 147.0 lb

## 2014-03-09 DIAGNOSIS — J9600 Acute respiratory failure, unspecified whether with hypoxia or hypercapnia: Secondary | ICD-10-CM

## 2014-03-09 DIAGNOSIS — J9601 Acute respiratory failure with hypoxia: Secondary | ICD-10-CM

## 2014-03-09 DIAGNOSIS — J449 Chronic obstructive pulmonary disease, unspecified: Secondary | ICD-10-CM

## 2014-03-09 MED ORDER — BUDESONIDE-FORMOTEROL FUMARATE 160-4.5 MCG/ACT IN AERO: INHALATION_SPRAY | RESPIRATORY_TRACT | Status: DC

## 2014-03-09 NOTE — Patient Instructions (Addendum)
Try symbicort 160 Take 2 puffs first thing in am and then another 2 puffs about 12 hours later - if you don't like it, resume advair  Work on inhaler technique:  relax and gently blow all the way out then take a nice smooth deep breath back in, triggering the inhaler at same time you start breathing in.  Hold for up to 5 seconds if you can.  Rinse and gargle with water when done  Only use your albuterol (proair) as a rescue medication to be used if you can't catch your breath by resting or doing a relaxed purse lip breathing pattern.  - The less you use it, the better it will work when you need it. - Ok to use up to 2 puffs  every 4 hours if you must but call for immediate appointment if use goes up over your usual need - Don't leave home without it !!  (think of it like the spare tire for your car)        Please schedule a follow up office visit in 4 weeks, sooner if needed with pfts

## 2014-03-09 NOTE — Progress Notes (Signed)
Subjective:    Patient ID: Linda Stevens, female    DOB: December 30, 1945  MRN: [id number]  HPI  51 yowf quit smoking 02/22/14  with baseline no need for 02, able to chase grandchildren around some, gardening and all housework  using inhaler alb/proair  sev times a day  Plus maint singulair and acutely ill on 02/19/14:   Admit date: 02/22/2014  Discharge date: 02/27/2014 Discharge Diagnosis:   Principal Problem:  1. Acute hypoxic and hypercarbic respiratory failure secondary to asthma exacerbation and CAP (community acquired pneumonia) Active Problems:  TOBACCO ABUSE  HTN (hypertension)  Hypoxia  Hypokalemia  Acute asthma flare  Thrush  Acute respiratory failure with hypoxia and hypercapnea Discharge Condition: Stable.  Diet recommendation: Low sodium, heart healthy.  History of Present Illness:   Linda Stevens is an 68 y.o. female with a PMH of asthma treated with PRN bronchodilator therapy, ongoing tobacco abuse who presented to her PCP on 02/20/14 with a chief complaint of upper respiratory symptoms including cough productive of clear/white mucus, sinus pain and pressure, head congestion, chills but no documented fever and pain around the eyes. She was given a prednisone Dosepak which failed to alleviate her symptoms, and returned to her PCP 02/22/14 who instructed her to come to the ER for further evaluation secondary to hypoxia. Upon initial evaluation in the ED, the patient was noted to have a pulse oximetry of 86% on room air (increased to 92-94% on supplemental oxygen), mild tachycardia, and a chest x-ray concerning for right middle lobe community-acquired pneumonia.  Hospital Course by Problem:   Principal Problem:  Acute hypoxic and hypercapnic respiratory failure secondary to asthma exacerbation/community-acquired pneumonia  Treated with empiric azithromycin/Rocephin. Patient not producing sputum, so sputum culture not sent. S. Pneumo & Legionella neg. D/C home on Ceftin  through 03/02/14.  Treated with Solu-Medrol 60 mg IV every 12 hours, nebulized bronchodilator therapy, and Mucinex/antitussives. D/C home on Advair, prednisone dose pack, Tylenol #3 PRN cough.  F/U CXR clear. ABG showed some hypoxia/hypercarbia. D-dimer not elevated, ProBNP mildly elevated (given a dose of Lasix). 2-D echo relatively normal. CT chest negative except for an enlarged lymph node and RML bronchiectasis.  Send home on home oxygen with [facility] RN to assess her periodically.  Pulmonology follow up arranged. Active Problems:  Diarrhea  C.diff negative.  Thrush  Likely from steroids. Treated with Nystatin. Tobacco abuse  Counseled.  Allergic rhinitis  Continue Singulair and Flonase. HTN (hypertension)  Continue HCTZ. Hypokalemia  Likely secondary to diuretic therapy.  Resolved with oral supplementation. Procedures:   2 D Echo 02/26/14:  Impressions:  - Technically difficult; vigorous LV function; grade 1 diastolic dysfunction; significant RVH and mildly reduced RV function; mild TR but signal inadequate to estimate pulmonary pressures.      03/09/2014 1st [facility] office visit/ Melvyn Novas / post hosp consultation  Chief Complaint  Patient presents with  . Pulmonary Consult    Referred per Dr Rockne Menghini. Pt [facility] after having PNA. She c/o cough- prod with minimal white to clear sputum.  She states that she does not know if she is SOB or not.   d/c on 3lpm 24/7 and advair and about 50% back to baseline just mostly staying at home and no sob on 02 room t room, off prednisone x 1 week s relapse of symptoms and still not smoking  Now on advair with raspy dry cough day > night and worsening hoarseness  No obvious other patterns  in day to day or daytime variabilty or assoc chronic cough or cp or chest tightness, subjective wheeze overt sinus or hb symptoms. No unusual exp hx or h/o childhood pna/ asthma or knowledge of premature birth.  Sleeping ok without  nocturnal  or early am exacerbation  of respiratory  c/o's or need for noct saba. Also denies any obvious fluctuation of symptoms with weather or environmental changes or other aggravating or alleviating factors except as outlined above   Current Medications, Allergies, Complete Past Medical History, Past Surgical History, Family History, and Social History were reviewed in [employer] record.            Review of Systems  Constitutional: Negative for fever, chills and unexpected weight change.  HENT: Positive for congestion. Negative for dental problem, ear pain, nosebleeds, postnasal drip, rhinorrhea, sinus pressure, sneezing, sore throat, trouble swallowing and voice change.   Eyes: Negative for visual disturbance.  Respiratory: Positive for cough. Negative for choking and shortness of breath.   Cardiovascular: Negative for chest pain and leg swelling.  Gastrointestinal: Negative for vomiting, abdominal pain and diarrhea.  Genitourinary: Negative for difficulty urinating.  Musculoskeletal: Negative for arthralgias.  Skin: Negative for rash.  Neurological: Negative for tremors, syncope and headaches.  Hematological: Does not bruise/bleed easily.       Objective:   Physical Exam  Wt Readings from Last 3 Encounters:  03/09/14 147 lb (66.679 kg)  02/22/14 150 lb 3.2 oz (68.13 kg)  02/22/14 147 lb 8 oz (66.906 kg)     Hoarse amb wf nad   HEENT mild turbinate edema.  Oropharynx no thrush or excess pnd or cobblestoning.  No JVD or cervical adenopathy. Mild accessory muscle hypertrophy. Trachea midline, nl thryroid. Chest was hyperinflated by percussion with diminished breath sounds and moderate increased exp time without wheeze. Hoover sign positive at mid inspiration. Regular rate and rhythm without murmur gallop or rub or increase P2 or edema.  Abd: no hsm, nl excursion. Ext warm without cyanosis or clubbing.     CT chest 02/26/14 w contrast  Mildly prominent  precarinal lymph node without further evidence of  mediastinal masses or adenopathy.  Scarring versus atelectasis along the minor fissure on the right and  left lung base.  Otherwise no evidence of thoracic pathology.  Small hiatal hernia.     Assessment & Plan:

## 2014-03-10 DIAGNOSIS — J449 Chronic obstructive pulmonary disease, unspecified: Secondary | ICD-10-CM | POA: Insufficient documentation

## 2014-03-10 NOTE — Assessment & Plan Note (Addendum)
DDX of  difficult airways management all start with A and  include Adherence, Ace Inhibitors, Acid Reflux, Active Sinus Disease, Alpha 1 Antitripsin deficiency, Anxiety masquerading as Airways dz,  ABPA,  allergy(esp in young), Aspiration (esp in elderly), Adverse effects of DPI,  Active smokers, plus two Bs  = Bronchiectasis and Beta blocker use..and one C= CHF  Adherence is always the initial "prime suspect" and is a multilayered concern that requires a "trust but verify" approach in every patient - starting with knowing how to use medications, especially inhalers, correctly, keeping up with refills and understanding the fundamental difference between maintenance and prns vs those medications only taken for a very short course and then stopped and not refilled.  - The proper method of use, as well as anticipated side effects, of a metered-dose inhaler are discussed and demonstrated to the patient. Improved effectiveness after extensive coaching during this visit to a level of approximately  75% so try symbicort 160 2bid  ? Adverse effect of dpi suggested by scratch throat/ dry cough/ hoarseness > try hfa  ? Active smoking > denies since admit  ? Active sinus dz/ allergies > continue flonase and singulair but not need for pred at this point    Each maintenance medication was reviewed in detail including most importantly the difference between maintenance and as needed and under what circumstances the prns are to be used.  Please see instructions for details which were reviewed in writing and the patient given a copy.

## 2014-03-10 NOTE — Assessment & Plan Note (Signed)
Started on 02 at d/c 02/27/14 - 03/10/2014   Walked RA x one lap @ 185 stopped due to  sats 90 and dropped further attempting second lap, improved on 3lpm  rec 3lpm at hs and with walking outside of the home as of 03/09/14

## 2014-03-14 ENCOUNTER — Encounter: Payer: Self-pay | Admitting: Family Medicine

## 2014-03-14 ENCOUNTER — Ambulatory Visit (INDEPENDENT_AMBULATORY_CARE_PROVIDER_SITE_OTHER): Payer: Medicare HMO | Admitting: Family Medicine

## 2014-03-14 VITALS — BP 110/70 | HR 96 | Temp 97.9°F | Ht 63.0 in | Wt 148.2 lb

## 2014-03-14 DIAGNOSIS — F17200 Nicotine dependence, unspecified, uncomplicated: Secondary | ICD-10-CM

## 2014-03-14 DIAGNOSIS — J9601 Acute respiratory failure with hypoxia: Secondary | ICD-10-CM

## 2014-03-14 DIAGNOSIS — B370 Candidal stomatitis: Secondary | ICD-10-CM

## 2014-03-14 DIAGNOSIS — J9600 Acute respiratory failure, unspecified whether with hypoxia or hypercapnia: Secondary | ICD-10-CM

## 2014-03-14 DIAGNOSIS — J45901 Unspecified asthma with (acute) exacerbation: Secondary | ICD-10-CM

## 2014-03-14 DIAGNOSIS — J441 Chronic obstructive pulmonary disease with (acute) exacerbation: Secondary | ICD-10-CM

## 2014-03-14 DIAGNOSIS — R0902 Hypoxemia: Secondary | ICD-10-CM

## 2014-03-14 DIAGNOSIS — J189 Pneumonia, unspecified organism: Secondary | ICD-10-CM

## 2014-03-14 DIAGNOSIS — I10 Essential (primary) hypertension: Secondary | ICD-10-CM

## 2014-03-14 MED ORDER — NYSTATIN 100000 UNIT/ML MT SUSP: 5.0000 mL | Freq: Four times a day (QID) | OROMUCOSAL | Status: DC

## 2014-03-14 MED ORDER — FLUTICASONE-SALMETEROL 250-50 MCG/DOSE IN AEPB: 1.0000 | INHALATION_SPRAY | Freq: Two times a day (BID) | RESPIRATORY_TRACT | Status: DC

## 2014-03-14 NOTE — Progress Notes (Signed)
Pre visit review using our clinic review tool, if applicable. No additional management support is needed unless otherwise documented below in the visit note.

## 2014-03-14 NOTE — Assessment & Plan Note (Addendum)
Deteriorated. Likely due to oral, IV and inhaled steroids. Advised rinsing after using advair. eRx sent for nystatin susp. Call or return to clinic prn if these symptoms worsen or fail to improve as anticipated. The patient indicates understanding of these issues and agrees with the plan.

## 2014-03-14 NOTE — Progress Notes (Signed)
Subjective:   Patient ID: Linda Stevens, female    DOB: [birth year], 68 y.o.   MRN: [id number]  Linda Stevens is a pleasant 68 y.o. year old female who presents to clinic today with Hospitalization Follow-up  on 03/14/2014  HPI: Hospital notes reviewed- admitted to [facility] 6/17- 6/22 for acute hypoxia and hypercarbic respiratory failure secondary to asthma exacerbation and CAP.  HPI on admission: Linda Stevens is an 68 y.o. female with a PMH of asthma treated with PRN bronchodilator therapy, ongoing tobacco abuse who presented to her PCP on 02/20/14 [facility]) with a chief complaint of upper respiratory symptoms including cough productive of clear/white mucus, sinus pain and pressure, head congestion, chills but no documented fever and pain around the eyes. She was given a prednisone Dosepak which failed to alleviate her symptoms, and returned to her PCP 02/22/14 (Dr. Damita Dunnings) who instructed her to come to the ER for further evaluation secondary to hypoxia. Upon initial evaluation in the ED, the patient was noted to have a pulse oximetry of 86% on room air (increased to 92-94% on supplemental oxygen), mild tachycardia, and a chest x-ray concerning for right middle lobe community-acquired pneumonia.  Treated with azithromycin, Rocephin empirically.  Legionella neg.  D/c'd home on Ceftin- finished course on 03/02/14, Advair, prednisone dose pack.  Followed up with Dr. Melvyn Novas on 7/3- note reviewed.  Rxs changed to symbicort, flonase and singulair.  She is taking flonase and singulair but stopped taking symbicort and restarted advair due to cost.  Now on continuous home O2.   Has not smoked since the day she was admitted the hospital.  Former 40 pack year history!  Still having problems with sore white patches on her tongue.  Dg Chest 2 View  02/22/2014   CLINICAL DATA:  Cough. Shortness of breath. Wheezing. Bilateral ear popping. Hypertension.  EXAM: CHEST  2 VIEW  COMPARISON:   02/01/2008  FINDINGS: Borderline cardiomegaly. Mildly prominent upper zone pulmonary vasculature. Indistinct right middle lobe density medially, best seen on the lateral projection below the minor fissure, compatible with airspace opacity.  Atherosclerotic aortic arch. No pleural effusion. Mild thoracic spondylosis  IMPRESSION: *Band of abnormal airspace opacity in the right middle lobe just below the minor fissure. Differential diagnosis includes atelectasis and pneumonia. *Borderline cardiomegaly, also with a borderline appearance for pulmonary venous hypertension.   Electronically Signed   By: Sherryl Barters M.D.   On: 02/22/2014 14:21   Ct Chest W Contrast  02/26/2014   CLINICAL DATA:  Hypoxic  EXAM: CT CHEST WITH CONTRAST  TECHNIQUE: Multidetector CT imaging of the chest was performed during intravenous contrast administration.  CONTRAST:  14m OMNIPAQUE IOHEXOL 300 MG/ML  SOLN  COMPARISON:  None.  FINDINGS: Thoracic inlet is unremarkable.  A 1.2 cm of the length in the precarinal region. Remaining mediastinal lymph nodes are sub is a cm in size. No evidence of mediastinal mass.  Atherosclerotic calcification in the aorta without a thoracic aortic aneurysm.  A small hiatal hernia is appreciated. Fluid is appreciated within a mildly distended, distal esophagus.  Mild areas of linear density in along the minor fissure are on the right and within the left lung base.  The central airways are patent.  The lungs otherwise clear.  Patient is status post cholecystectomy. Visualized upper abdominal viscera are unremarkable.  Multilevel spondylosis within the spine no aggressive appearing osseous lesions.  IMPRESSION: Mildly prominent precarinal lymph node without further evidence of mediastinal masses or adenopathy.  Scarring versus  atelectasis along the minor fissure on the right and left lung base.  Otherwise no evidence of thoracic pathology.  Small hiatal hernia.   Electronically Signed   By: Margaree Mackintosh  M.D.   On: 02/26/2014 12:20   Dg Chest Port 1 View  02/25/2014   CLINICAL DATA:  Followup pneumonia.  EXAM: PORTABLE CHEST - 1 VIEW  COMPARISON:  02/22/2014  FINDINGS: Generous heart size, stable from prior. Stable upper mediastinal contours with mild aortic tortuosity and atherosclerotic calcification. There is no evidence of consolidation, edema, effusion, or pneumothorax. The previously noted opacity in the right mid chest is not visible, but this finding was only seen in the lateral projection on the previous study.  IMPRESSION: Clear chest in the frontal projection.   Electronically Signed   By: Jorje Guild M.D.   On: 02/25/2014 09:08   Lab Results  Component Value Date   NA 141 02/26/2014   K 4.3 02/26/2014   CL 95* 02/26/2014   CO2 34* 02/26/2014   Lab Results  Component Value Date   CREATININE 1.03 02/26/2014   Lab Results  Component Value Date   WBC 10.1 02/26/2014   HGB 17.6* 02/26/2014   HCT 50.8* 02/26/2014   MCV 98.4 02/26/2014   PLT 300 02/26/2014   Current Outpatient Prescriptions on File Prior to Visit  Medication Sig Dispense Refill  . Acetaminophen-Codeine 300-30 MG per tablet Take 1 tablet by mouth every 4 (four) hours as needed (Body aches with cough).  30 tablet  0  . albuterol (PROVENTIL HFA;VENTOLIN HFA) 108 (90 BASE) MCG/ACT inhaler Inhale 1 puff into the lungs every 6 (six) hours as needed for wheezing.  1 Inhaler  11  . budesonide-formoterol (SYMBICORT) 160-4.5 MCG/ACT inhaler Take 2 puffs first thing in am and then another 2 puffs about 12 hours later.  1 Inhaler  11  . fluticasone (FLONASE) 50 MCG/ACT nasal spray Place 2 sprays into both nostrils daily.      . hydrochlorothiazide (MICROZIDE) 12.5 MG capsule Take 12.5 mg by mouth daily.      . montelukast (SINGULAIR) 10 MG tablet TAKE 1 TABLET BY MOUTH AT BEDTIME  90 tablet  1  . potassium chloride SA (K-DUR,KLOR-CON) 20 MEQ tablet Take 1 tablet (20 mEq total) by mouth daily.  30 tablet  3   No current  facility-administered medications on file prior to visit.    Allergies  Allergen Reactions  . Amoxicillin-Pot Clavulanate     REACTION: gi upset  . Cefdinir     REACTION: gi  upset  . Moxifloxacin     REACTION: rash    Past Medical History  Diagnosis Date  . Allergic rhinitis due to pollen   . Hypertension   . H/O seasonal allergies   . Mild diastolic dysfunction 6/81/1572  . Acute respiratory failure with hypoxia and hypercapnea 02/26/2014    Past Surgical History  Procedure Laterality Date  . Cholecystectomy      Open    Family History  Problem Relation Age of Onset  . Glaucoma Brother   . Vascular Disease Father     Died of complications from surgery  . Asthma Mother     Died of asthma attack  . Dementia Mother     History   Social History  . Marital Status: Married    Spouse Name: N/A    Number of Children: N/A  . Years of Education: N/A   Occupational History  . Not on file.  Social History Main Topics  . Smoking status: Former Smoker -- 1.00 packs/day for 40 years    Types: Cigarettes    Quit date: 02/22/2014  . Smokeless tobacco: Never Used  . Alcohol Use: No  . Drug Use: No  . Sexual Activity: No   Other Topics Concern  . Not on file   Social History Narrative   Married with 2 children.  Independent of ADLs.   The PMH, PSH, Social History, Family History, Medications, and allergies have been reviewed in [facility], and have been updated if relevant.     Review of Systems    See HPI- Still fatigued but feeling a little better every day. Cough no longer productive but still has morning cough No fever No LE edema Objective:    BP 110/70  Pulse 96  Temp(Src) 97.9 F (36.6 C) (Oral)  Ht _0  (1.6 m)  Wt 148 lb 4 oz (67.246 kg)  BMI 26.27 kg/m2  SpO2 95%   Physical Exam  Nursing note and vitals reviewed. Constitutional: She appears well-developed and well-nourished. No distress.  HENT:  Head: Normocephalic and atraumatic.    Mouth/Throat:    Cardiovascular: Normal rate and regular rhythm.   Pulmonary/Chest: Effort normal and breath sounds normal. No accessory muscle usage. No respiratory distress.  Skin: Skin is warm, dry and intact.  Psychiatric: She has a normal mood and affect. Her speech is normal and behavior is normal. Judgment and thought content normal. Cognition and memory are normal.          Assessment & Plan:   Acute respiratory failure with hypoxia and hypercapnea  TOBACCO ABUSE  Thrush  Hypoxia  CAP (community acquired pneumonia) No Follow-up on file.

## 2014-03-14 NOTE — Assessment & Plan Note (Signed)
Remains O2 dependent with exertion- see pulm note.

## 2014-03-14 NOTE — Assessment & Plan Note (Signed)
Resolved. Follow up CXR neg.

## 2014-03-14 NOTE — Patient Instructions (Signed)
Use nystatin rinse discussed.  Great to see you.   Please come see me in 1-2 months.

## 2014-03-14 NOTE — Assessment & Plan Note (Signed)
Congratulated her on quitting!

## 2014-03-23 ENCOUNTER — Telehealth: Payer: Self-pay | Admitting: Family Medicine

## 2014-03-23 NOTE — Telephone Encounter (Signed)
Pt Husband dropped off handicap placard form for completion.  Placed on keyboard at [employer]. Best number to call when ready for pickup [phone]

## 2014-03-23 NOTE — Telephone Encounter (Signed)
Form placed in Dr Hulen Shouts inbox for review, approval/denial

## 2014-03-26 ENCOUNTER — Other Ambulatory Visit: Payer: Self-pay | Admitting: Family Medicine

## 2014-03-27 DIAGNOSIS — Z0279 Encounter for issue of other medical certificate: Secondary | ICD-10-CM

## 2014-03-27 NOTE — Telephone Encounter (Signed)
Pt notified of forms ready for pick up

## 2014-04-05 ENCOUNTER — Other Ambulatory Visit: Payer: Self-pay | Admitting: Internal Medicine

## 2014-04-05 DIAGNOSIS — J449 Chronic obstructive pulmonary disease, unspecified: Secondary | ICD-10-CM

## 2014-04-06 ENCOUNTER — Ambulatory Visit (INDEPENDENT_AMBULATORY_CARE_PROVIDER_SITE_OTHER): Payer: Medicare HMO | Admitting: Internal Medicine

## 2014-04-06 ENCOUNTER — Encounter: Payer: Self-pay | Admitting: Internal Medicine

## 2014-04-06 VITALS — BP 110/60 | HR 87 | Temp 97.7°F | Ht 63.5 in | Wt 154.0 lb

## 2014-04-06 DIAGNOSIS — J449 Chronic obstructive pulmonary disease, unspecified: Secondary | ICD-10-CM

## 2014-04-06 DIAGNOSIS — J9600 Acute respiratory failure, unspecified whether with hypoxia or hypercapnia: Secondary | ICD-10-CM

## 2014-04-06 DIAGNOSIS — J9601 Acute respiratory failure with hypoxia: Secondary | ICD-10-CM

## 2014-04-06 NOTE — Assessment & Plan Note (Signed)
Started on 02 at d/c 02/27/14 - 03/10/2014   Walked RA x one lap @ 185 stopped due to  sats 90 and dropped further attempting second lap, improved on 3lpm - d/c 02 04/06/2014 as pt walking up to half a mile s it.

## 2014-04-06 NOTE — Assessment & Plan Note (Signed)
03/09/2014 p extensive coaching HFA effectiveness =    75% > try symbicort 160 2bid > preferred advair due to insurance - PFT's 04/06/2014  FEV1  1.23 (52%) ratio 61 and no change p saba and dlco 51% no advair x 12 hours   She's doing great more related to not smoking that to meds - since she is only a GOLD II and no flares off cigs ok to just use prn saba.    Each maintenance medication was reviewed in detail including most importantly the difference between maintenance and as needed and under what circumstances the prns are to be used.  Please see instructions for details which were reviewed in writing and the patient given a copy.

## 2014-04-06 NOTE — Progress Notes (Signed)
PFT done today. 

## 2014-04-06 NOTE — Patient Instructions (Addendum)
Ok to try off advair to see what difference it makes remembering that it takes a full for the benefit  Only use your albuterol as a rescue medication to be used if you can't catch your breath by resting or doing a relaxed purse lip breathing pattern.  - The less you use it, the better it will work when you need it. - Ok to use up to 2 puffs  every 4 hours if you must but call for immediate appointment if use goes up over your usual need - Don't leave home without it !!  (think of it like the spare tire for your car)    If you are satisfied with your treatment plan,  let your doctor know and he/she can either refill your medications or you can return here when your prescription runs out.     If in any way you are not 100% satisfied,  please tell us.  If 100% better, tell your friends!  Pulmonary follow up is as needed

## 2014-04-06 NOTE — Progress Notes (Signed)
Subjective:    Patient ID: Linda Stevens, female    DOB: March 04, 1946  MRN: [id number]    Brief patient profile:  62 yowf quit smoking 02/22/14  with baseline no need for 02, able to chase grandchildren around some, gardening and all housework  using inhaler alb/proair  sev times a day  Plus maint singulair and acutely ill on 02/19/14:   Admit date: 02/22/2014  Discharge date: 02/27/2014 Discharge Diagnosis:   Principal Problem:  1. Acute hypoxic and hypercarbic respiratory failure secondary to asthma exacerbation and CAP (community acquired pneumonia) Active Problems:  TOBACCO ABUSE  HTN (hypertension)  Hypoxia  Hypokalemia  Acute asthma flare  Thrush  Acute respiratory failure with hypoxia and hypercapnea Discharge Condition: Stable.  Diet recommendation: Low sodium, heart healthy.  History of Present Illness:   Linda Stevens is an 68 y.o. female with a PMH of asthma treated with PRN bronchodilator therapy, ongoing tobacco abuse who presented to her PCP on 02/20/14 with a chief complaint of upper respiratory symptoms including cough productive of clear/white mucus, sinus pain and pressure, head congestion, chills but no documented fever and pain around the eyes. She was given a prednisone Dosepak which failed to alleviate her symptoms, and returned to her PCP 02/22/14 who instructed her to come to the ER for further evaluation secondary to hypoxia. Upon initial evaluation in the ED, the patient was noted to have a pulse oximetry of 86% on room air (increased to 92-94% on supplemental oxygen), mild tachycardia, and a chest x-ray concerning for right middle lobe community-acquired pneumonia.  Hospital Course by Problem:   Principal Problem:  Acute hypoxic and hypercapnic respiratory failure secondary to asthma exacerbation/community-acquired pneumonia  Treated with empiric azithromycin/Rocephin. Patient not producing sputum, so sputum culture not sent. S. Pneumo & Legionella neg.  D/C home on Ceftin through 03/02/14.  Treated with Solu-Medrol 60 mg IV every 12 hours, nebulized bronchodilator therapy, and Mucinex/antitussives. D/C home on Advair, prednisone dose pack, Tylenol #3 PRN cough.  F/U CXR clear. ABG showed some hypoxia/hypercarbia. D-dimer not elevated, ProBNP mildly elevated (given a dose of Lasix). 2-D echo relatively normal. CT chest negative except for an enlarged lymph node and RML bronchiectasis.  Send home on home oxygen with [facility] RN to assess her periodically.  Pulmonology follow up arranged. Active Problems:  Diarrhea  C.diff negative.  Thrush  Likely from steroids. Treated with Nystatin. Tobacco abuse  Counseled.  Allergic rhinitis  Continue Singulair and Flonase. HTN (hypertension)  Continue HCTZ. Hypokalemia  Likely secondary to diuretic therapy.  Resolved with oral supplementation. Procedures:   2 D Echo 02/26/14:  Impressions:  - Technically difficult; vigorous LV function; grade 1 diastolic dysfunction; significant RVH and mildly reduced RV function; mild TR but signal inadequate to estimate Stevens pressures.      03/09/2014 1st Linda Stevens office visit/ Linda Stevens / post hosp consultation  Chief Complaint  Patient presents with  . Stevens Consult    Referred per Linda Stevens. Pt d/ced from Linda Stevens on 02/27/14 after having PNA. She c/o cough- prod with minimal white to clear sputum.  She states that she does not know if she is SOB or not.   d/c on 3lpm 24/7 and advair and about 50% back to baseline just mostly staying at home and no sob on 02 room t room, off prednisone x 1 week s relapse of symptoms and still not smoking  Now on advair with raspy dry cough day > night and worsening hoarseness rec  Try symbicort 160 Take 2 puffs first thing in am and then another 2 puffs about 12 hours later - if you don't like it, resume advair Work on inhaler technique:   Only use your albuterol (proair) as a rescue medication    04/06/2014 f/u  [name] re: GOLD II COPD/ maintaining off cigs and doing fine on advair 250 bid  Chief Complaint  Patient presents with  . Followup with PFT    Pt states that her cough is much better. She states still has occ cough in the am with minimal clear sputum.   Walked half mile s 02 s stopping s 32 desat one day prior to OV   Not using any albuterol at all   No obvious other patterns in day to day or daytime variabilty or assoc cp or chest tightness, subjective wheeze overt sinus or hb symptoms. No unusual exp hx or h/o childhood pna/ asthma or knowledge of premature birth.  Sleeping ok without nocturnal  or early am exacerbation  of respiratory  c/o's or need for noct saba. Also denies any obvious fluctuation of symptoms with weather or environmental changes or other aggravating or alleviating factors except as outlined above   Current Medications, Allergies, Complete Past Medical History, Past Surgical History, Family History, and Social History were reviewed in [employer] record.                 Objective:   Physical Exam  04/06/2014        154  Wt Readings from Last 3 Encounters:  03/09/14 147 lb (66.679 kg)  02/22/14 150 lb 3.2 oz (68.13 kg)  02/22/14 147 lb 8 oz (66.906 kg)     Hoarse amb wf nad   HEENT mild turbinate edema.  Oropharynx no thrush or excess pnd or cobblestoning.  No JVD or cervical adenopathy. Mild accessory muscle hypertrophy. Trachea midline, nl thryroid. Chest was hyperinflated by percussion with diminished breath sounds and moderate increased exp time without wheeze. Hoover sign positive at mid inspiration. Regular rate and rhythm without murmur gallop or rub or increase P2 or edema.  Abd: no hsm, nl excursion. Ext warm without cyanosis or clubbing.     CT chest 02/26/14 w contrast  Mildly prominent precarinal lymph node without further evidence of  mediastinal masses or adenopathy.  Scarring versus atelectasis along the minor fissure on  the right and  left lung base.  Otherwise no evidence of thoracic pathology.  Small hiatal hernia.     Assessment & Plan:

## 2014-04-09 LAB — PULMONARY FUNCTION TEST
DL/VA % PRED: 71 %
DL/VA: 3.39 ml/min/mmHg/L
DLCO unc % pred: 51 %
DLCO unc: 12.25 ml/min/mmHg
FEF 25-75 Post: 0.82 L/sec
FEF 25-75 Pre: 0.63 L/sec
FEF2575-%CHANGE-POST: 29 %
FEF2575-%PRED-PRE: 30 %
FEF2575-%Pred-Post: 39 %
FEV1-%CHANGE-POST: 9 %
FEV1-%Pred-Post: 57 %
FEV1-%Pred-Pre: 52 %
FEV1-Post: 1.36 L
FEV1-Pre: 1.23 L
FEV1FVC-%CHANGE-POST: 1 %
FEV1FVC-%Pred-Pre: 79 %
FEV6-%CHANGE-POST: 6 %
FEV6-%PRED-PRE: 67 %
FEV6-%Pred-Post: 72 %
FEV6-Post: 2.14 L
FEV6-Pre: 2 L
FEV6FVC-%Change-Post: 0 %
FEV6FVC-%PRED-POST: 103 %
FEV6FVC-%PRED-PRE: 103 %
FVC-%Change-Post: 7 %
FVC-%PRED-POST: 70 %
FVC-%Pred-Pre: 65 %
FVC-POST: 2.17 L
FVC-PRE: 2.01 L
Post FEV1/FVC ratio: 63 %
Post FEV6/FVC ratio: 100 %
Pre FEV1/FVC ratio: 61 %
Pre FEV6/FVC Ratio: 99 %
RV % PRED: 114 %
RV: 2.38 L
TLC % PRED: 88 %
TLC: 4.39 L

## 2014-04-12 ENCOUNTER — Other Ambulatory Visit: Payer: Self-pay | Admitting: Family Medicine

## 2014-04-12 NOTE — Telephone Encounter (Signed)
Pt requesting medication refill of abx. Last f/u appt 03/2014 with upcoming 04/2014 appt. pls advise

## 2014-04-19 ENCOUNTER — Ambulatory Visit (INDEPENDENT_AMBULATORY_CARE_PROVIDER_SITE_OTHER): Payer: Medicare HMO | Admitting: Family Medicine

## 2014-04-19 ENCOUNTER — Encounter: Payer: Self-pay | Admitting: Family Medicine

## 2014-04-19 ENCOUNTER — Telehealth: Payer: Self-pay | Admitting: Family Medicine

## 2014-04-19 VITALS — BP 116/66 | HR 94 | Temp 98.0°F | Wt 154.5 lb

## 2014-04-19 DIAGNOSIS — Z23 Encounter for immunization: Secondary | ICD-10-CM

## 2014-04-19 DIAGNOSIS — Z7189 Other specified counseling: Secondary | ICD-10-CM | POA: Insufficient documentation

## 2014-04-19 DIAGNOSIS — J9600 Acute respiratory failure, unspecified whether with hypoxia or hypercapnia: Secondary | ICD-10-CM

## 2014-04-19 DIAGNOSIS — J9601 Acute respiratory failure with hypoxia: Secondary | ICD-10-CM

## 2014-04-19 DIAGNOSIS — J309 Allergic rhinitis, unspecified: Secondary | ICD-10-CM

## 2014-04-19 DIAGNOSIS — J449 Chronic obstructive pulmonary disease, unspecified: Secondary | ICD-10-CM

## 2014-04-19 DIAGNOSIS — I10 Essential (primary) hypertension: Secondary | ICD-10-CM

## 2014-04-19 NOTE — Assessment & Plan Note (Signed)
Resolved- doing well off supplemental O2 and since she quit smoking.

## 2014-04-19 NOTE — Assessment & Plan Note (Signed)
Discussed medicare wellness visits with patient. She had several questions and I hopefully answered them today. Pneumovax given.  She will check with insurance company about zostavax coverage. She will also make an appointment for a welcome to medicare physical.

## 2014-04-19 NOTE — Progress Notes (Signed)
Pre visit review using our clinic review tool, if applicable. No additional management support is needed unless otherwise documented below in the visit note. 

## 2014-04-19 NOTE — Assessment & Plan Note (Signed)
Well controlled.  I am actually concerned about hypotension at this point.  Advised to hold HCTZ 12. 5 mg daily and follow up with me at Welcome to Medicare visit in 2 weeks. The patient indicates understanding of these issues and agrees with the plan.

## 2014-04-19 NOTE — Patient Instructions (Addendum)
Great to see you. Please schedule a welcome to medicare wellness visit on your way out (in two weeks).  Check with your insurance to see if they will cover the shingles shot.  Ok to try Advair diskus once a day unless your breathing worsens during allergy season.   Ok to stop taking HCTZ and follow up in 2 weeks.

## 2014-04-19 NOTE — Telephone Encounter (Signed)
Relevant patient education assigned to patient using Emmi. ° °

## 2014-04-19 NOTE — Progress Notes (Signed)
Subjective:   Patient ID: Linda Stevens, female    DOB: [birth year], 68 y.o.   MRN: [id number]  LADY Linda Stevens is a pleasant 68 y.o. year old female who presents to clinic today with Follow-up  on 04/19/2014  HPI: COPD- Hospitalized for acute respiratory failure in 02/2014. When I saw her last month, she had quit smoking and was on continuous O2.  She continues to not smoke and is no longer requiring supplemental O2!  Saw Dr. Melvyn Novas on 04/06/14- note reviewed.  No changes made.  Per pt, she was told she could cut back Advair to 1 puff daily rather than twice daily.  Has not needed rescue inhaler.    She is walking 1000 steps twice daily and feels great.  No CP or DOE.  HTN- BP has been on low side and she is mildly tachycardic today.  She denies any symptoms of orthostasis.  She takes HCTZ 12.5 mg daily. BP Readings from Last 3 Encounters:  04/19/14 116/66  04/06/14 110/60  03/14/14 110/70   Lab Results  Component Value Date   CREATININE 1.03 02/26/2014   She has questions today about Medicare and preventative visits/procedures today as well. Had pneumovax years ago. Has never had it since she turned 89.   Also has not had zostavax.   Current Outpatient Prescriptions on File Prior to Visit  Medication Sig Dispense Refill  . Acetaminophen-Codeine 300-30 MG per tablet Take 1 tablet by mouth every 4 (four) hours as needed (Body aches with cough).  30 tablet  0  . albuterol (PROVENTIL HFA;VENTOLIN HFA) 108 (90 BASE) MCG/ACT inhaler Inhale 1 puff into the lungs every 6 (six) hours as needed for wheezing.  1 Inhaler  11  . fluticasone (FLONASE) 50 MCG/ACT nasal spray Place 2 sprays into both nostrils daily.      . Fluticasone-Salmeterol (ADVAIR DISKUS) 250-50 MCG/DOSE AEPB Inhale 1 puff into the lungs 2 (two) times daily.  60 each  3  . hydrochlorothiazide (MICROZIDE) 12.5 MG capsule Take 12.5 mg by mouth daily.      . montelukast (SINGULAIR) 10 MG tablet TAKE 1 TABLET BY MOUTH  AT BEDTIME  90 tablet  1  . nystatin (MYCOSTATIN) 100000 UNIT/ML suspension TAKE 5 MLS (500,000 UNITS TOTAL) BY MOUTH 4 (FOUR) TIMES DAILY.  60 mL  0  . potassium chloride SA (K-DUR,KLOR-CON) 20 MEQ tablet Take 1 tablet (20 mEq total) by mouth daily.  30 tablet  3   No current facility-administered medications on file prior to visit.    Allergies  Allergen Reactions  . Amoxicillin-Pot Clavulanate     REACTION: gi upset  . Cefdinir     REACTION: gi  upset  . Moxifloxacin     REACTION: rash    Past Medical History  Diagnosis Date  . Allergic rhinitis due to pollen   . Hypertension   . H/O seasonal allergies   . Mild diastolic dysfunction 9/62/9528  . Acute respiratory failure with hypoxia and hypercapnea 02/26/2014    Past Surgical History  Procedure Laterality Date  . Cholecystectomy      Open    Family History  Problem Relation Age of Onset  . Glaucoma Brother   . Vascular Disease Father     Died of complications from surgery  . Asthma Mother     Died of asthma attack  . Dementia Mother     History   Social History  . Marital Status: Married    Spouse Name: N/A  Number of Children: N/A  . Years of Education: N/A   Occupational History  . Not on file.   Social History Main Topics  . Smoking status: Former Smoker -- 1.00 packs/day for 40 years    Types: Cigarettes    Quit date: 02/22/2014  . Smokeless tobacco: Never Used  . Alcohol Use: No  . Drug Use: No  . Sexual Activity: No   Other Topics Concern  . Not on file   Social History Narrative   Married with 2 children.  Independent of ADLs.   The PMH, PSH, Social History, Family History, Medications, and allergies have been reviewed in [facility], and have been updated if relevant.    Review of Systems    See HPI No fevers No CP No SOB No dizziness when standing from seated position No anxiety or depression No LE edema No rhinitis or sinus pressure Objective:    BP 116/66  Pulse 94   Temp(Src) 98 F (36.7 C) (Oral)  Wt 154 lb 8 oz (70.081 kg)  SpO2 95%  Wt Readings from Last 3 Encounters:  04/19/14 154 lb 8 oz (70.081 kg)  04/06/14 154 lb (69.854 kg)  03/14/14 148 lb 4 oz (67.246 kg)     Physical Exam   General: Well-developed,well-nourished,in no acute distress; alert,appropriate and cooperative throughout examination  Head: normocephalic and atraumatic.  Eyes: vision grossly intact, pupils equal, pupils round, and pupils reactive to light.  Ears: R ear normal and L ear normal.  Nose: no external deformity.  Mouth: good dentition.  Lungs: Normal respiratory effort, chest expands symmetrically. Lungs are clear to auscultation, no crackles or wheezes.  Heart: Normal rate and regular rhythm. S1 and S2 normal without gallop, murmur, click, rub or other extra sounds.  Msk: No deformity or scoliosis noted of thoracic or lumbar spine.  Extremities: No clubbing, cyanosis, edema, or deformity noted with normal full range of motion of all joints.  Neurologic: alert & oriented X3 and gait normal.  Skin: Intact without suspicious lesions or rashes  Psych: Cognition and judgment appear intact. Alert and cooperative with normal attention span and concentration. No apparent delusions, illusions, hallucinations      Assessment & Plan:   COPD II  No Follow-up on file.

## 2014-04-19 NOTE — Assessment & Plan Note (Signed)
.   Followed by Dr. Melvyn Novas. Agreed that it would be ok to try to wean her advair dose since she is doing so well.  She does have h/o allergic asthma so may be best to wait until after allergy season. The patient indicates understanding of these issues and agrees with the plan.

## 2014-04-20 ENCOUNTER — Telehealth: Payer: Self-pay

## 2014-04-20 NOTE — Telephone Encounter (Signed)
Nurse from [facility] left v/m; pt is being discharged early from home care due to pt meeting all her goals for home care and pt is infection free.

## 2014-04-24 ENCOUNTER — Other Ambulatory Visit: Payer: Self-pay | Admitting: Family Medicine

## 2014-04-24 DIAGNOSIS — Z136 Encounter for screening for cardiovascular disorders: Secondary | ICD-10-CM

## 2014-04-24 DIAGNOSIS — Z0000 Encounter for general adult medical examination without abnormal findings: Secondary | ICD-10-CM

## 2014-04-26 ENCOUNTER — Other Ambulatory Visit (INDEPENDENT_AMBULATORY_CARE_PROVIDER_SITE_OTHER): Payer: Medicare HMO

## 2014-04-26 DIAGNOSIS — Z136 Encounter for screening for cardiovascular disorders: Secondary | ICD-10-CM

## 2014-04-26 DIAGNOSIS — I10 Essential (primary) hypertension: Secondary | ICD-10-CM

## 2014-04-26 DIAGNOSIS — Z0000 Encounter for general adult medical examination without abnormal findings: Secondary | ICD-10-CM

## 2014-04-26 DIAGNOSIS — E876 Hypokalemia: Secondary | ICD-10-CM

## 2014-04-26 LAB — COMPREHENSIVE METABOLIC PANEL
ALT: 14 U/L (ref 0–35)
AST: 16 U/L (ref 0–37)
Albumin: 3.6 g/dL (ref 3.5–5.2)
Alkaline Phosphatase: 46 U/L (ref 39–117)
BILIRUBIN TOTAL: 0.9 mg/dL (ref 0.2–1.2)
BUN: 11 mg/dL (ref 6–23)
CO2: 32 meq/L (ref 19–32)
CREATININE: 1 mg/dL (ref 0.4–1.2)
Calcium: 8.9 mg/dL (ref 8.4–10.5)
Chloride: 102 mEq/L (ref 96–112)
GFR: 62.56 mL/min (ref >60.00)
Glucose, Bld: 86 mg/dL (ref 70–99)
Potassium: 3.3 mEq/L — ABNORMAL LOW (ref 3.5–5.1)
Sodium: 142 mEq/L (ref 135–145)
Total Protein: 6.5 g/dL (ref 6.0–8.3)

## 2014-04-26 LAB — TSH: TSH: 0.43 u[IU]/mL (ref 0.35–4.50)

## 2014-04-26 LAB — CBC WITH DIFFERENTIAL/PLATELET
Basophils Absolute: 0 10*3/uL (ref 0.0–0.1)
Basophils Relative: 0.5 % (ref 0.0–3.0)
EOS ABS: 0.2 10*3/uL (ref 0.0–0.7)
Eosinophils Relative: 3 % (ref 0.0–5.0)
HEMATOCRIT: 43.2 % (ref 36.0–46.0)
HEMOGLOBIN: 14.5 g/dL (ref 12.0–15.0)
LYMPHS ABS: 1.3 10*3/uL (ref 0.7–4.0)
Lymphocytes Relative: 20.3 % (ref 12.0–46.0)
MCHC: 33.7 g/dL (ref 30.0–36.0)
MCV: 97.1 fl (ref 78.0–100.0)
Monocytes Absolute: 0.6 10*3/uL (ref 0.1–1.0)
Monocytes Relative: 9.4 % (ref 3.0–12.0)
NEUTROS ABS: 4.3 10*3/uL (ref 1.4–7.7)
Neutrophils Relative %: 66.8 % (ref 43.0–77.0)
PLATELETS: 297 10*3/uL (ref 150.0–400.0)
RBC: 4.45 Mil/uL (ref 3.87–5.11)
RDW: 15.8 % — ABNORMAL HIGH (ref 11.5–15.5)
WBC: 6.4 10*3/uL (ref 4.0–10.5)

## 2014-04-26 LAB — LIPID PANEL
CHOLESTEROL: 188 mg/dL (ref 0–200)
HDL: 61.6 mg/dL (ref >39.00)
LDL CALC: 95 mg/dL (ref 0–99)
NONHDL: 126.4
Total CHOL/HDL Ratio: 3
Triglycerides: 159 mg/dL — ABNORMAL HIGH (ref 0.0–149.0)
VLDL: 31.8 mg/dL (ref 0.0–40.0)

## 2014-05-03 ENCOUNTER — Encounter: Payer: Self-pay | Admitting: Family Medicine

## 2014-05-03 ENCOUNTER — Other Ambulatory Visit (HOSPITAL_COMMUNITY)
Admission: RE | Admit: 2014-05-03 | Discharge: 2014-05-03 | Disposition: A | Discharge status: Home / Self Care | Payer: Medicare HMO | Source: Ambulatory Visit | Attending: Family Medicine | Admitting: Family Medicine

## 2014-05-03 ENCOUNTER — Ambulatory Visit (INDEPENDENT_AMBULATORY_CARE_PROVIDER_SITE_OTHER): Payer: Medicare HMO | Admitting: Family Medicine

## 2014-05-03 VITALS — BP 120/78 | HR 90 | Temp 98.2°F | Ht 63.0 in | Wt 159.5 lb

## 2014-05-03 DIAGNOSIS — N959 Unspecified menopausal and perimenopausal disorder: Secondary | ICD-10-CM

## 2014-05-03 DIAGNOSIS — J449 Chronic obstructive pulmonary disease, unspecified: Secondary | ICD-10-CM

## 2014-05-03 DIAGNOSIS — Z124 Encounter for screening for malignant neoplasm of cervix: Secondary | ICD-10-CM

## 2014-05-03 DIAGNOSIS — Z7189 Other specified counseling: Secondary | ICD-10-CM | POA: Insufficient documentation

## 2014-05-03 DIAGNOSIS — Z1151 Encounter for screening for human papillomavirus (HPV): Secondary | ICD-10-CM | POA: Insufficient documentation

## 2014-05-03 DIAGNOSIS — Z01419 Encounter for gynecological examination (general) (routine) without abnormal findings: Secondary | ICD-10-CM

## 2014-05-03 DIAGNOSIS — Z23 Encounter for immunization: Secondary | ICD-10-CM

## 2014-05-03 DIAGNOSIS — Z1211 Encounter for screening for malignant neoplasm of colon: Secondary | ICD-10-CM

## 2014-05-03 DIAGNOSIS — J9601 Acute respiratory failure with hypoxia: Secondary | ICD-10-CM

## 2014-05-03 DIAGNOSIS — J9600 Acute respiratory failure, unspecified whether with hypoxia or hypercapnia: Secondary | ICD-10-CM

## 2014-05-03 DIAGNOSIS — Z0000 Encounter for general adult medical examination without abnormal findings: Secondary | ICD-10-CM

## 2014-05-03 DIAGNOSIS — I10 Essential (primary) hypertension: Secondary | ICD-10-CM

## 2014-05-03 MED ORDER — NYSTATIN 100000 UNIT/ML MT SUSP: OROMUCOSAL | Status: DC

## 2014-05-03 NOTE — Addendum Note (Signed)
Addended by: Ellamae Sia on: 05/03/2014 02:26 PM   Modules accepted: Orders

## 2014-05-03 NOTE — Addendum Note (Signed)
Addended by: Modena Nunnery on: 05/03/2014 01:00 PM   Modules accepted: Orders

## 2014-05-03 NOTE — Progress Notes (Signed)
Pre visit review using our clinic review tool, if applicable. No additional management support is needed unless otherwise documented below in the visit note. 

## 2014-05-03 NOTE — Progress Notes (Signed)
Subjective:   Patient ID: Linda Stevens, female    DOB: 07/14/1946, 68 y.o.   MRN: [id number]  Linda Stevens is a pleasant 68 y.o. year old female who presents to clinic today with Annual Exam  on 05/03/2014  HPI: Here for welcome to medicare physical. I have personally reviewed the Medicare Annual Wellness questionnaire and have noted 1. The patient's medical and social history 2. Their use of alcohol, tobacco or illicit drugs 3. Their current medications and supplements 4. The patient's functional ability including ADL's, fall risks, home safety risks and hearing or visual             impairment. 5. Diet and physical activities 6. Evidence for depression or mood disorders  End of life wishes discussed and updated in Social History.  The roster of all physicians providing medical care to patient - is listed in the Snapshot section of the chart.  Due for pap smear- has not had one in years. LMP over 15 years ago.  No h/o post menopausal bleeding.  Pneumovax 04/19/14 Last eye exam 02/17/13- Gershon Crane She states her insurance company will cover shingles vaccine.  COPD-  Hospitalized for acute respiratory failure in 02/2014.   Saw Dr. Melvyn Novas on 04/06/14- note reviewed. No changes made. Per pt, she was told she could cut back Advair to 1 puff daily rather than twice daily. Has not needed rescue inhaler.  She is thinking about weaning down.  She is still walking 1000 steps twice daily and feels great. No CP or DOE.   HTN- when I saw her two weeks ago, BP had been on low side and she was mildly tachycardic. Held her HCTZ 12. 5 mg daily and advised her to follow up BP today.  Lab Results  Component Value Date   CREATININE 1.0 04/26/2014   Lab Results  Component Value Date   CHOL 188 04/26/2014   HDL 61.60 04/26/2014   LDLCALC 95 04/26/2014   TRIG 159.0* 04/26/2014   CHOLHDL 3 04/26/2014   Lab Results  Component Value Date   TSH 0.43 04/26/2014   Lab Results  Component  Value Date   WBC 6.4 04/26/2014   HGB 14.5 04/26/2014   HCT 43.2 04/26/2014   MCV 97.1 04/26/2014   PLT 297.0 04/26/2014    Current Outpatient Prescriptions on File Prior to Visit  Medication Sig Dispense Refill  . Acetaminophen-Codeine 300-30 MG per tablet Take 1 tablet by mouth every 4 (four) hours as needed (Body aches with cough).  30 tablet  0  . albuterol (PROVENTIL HFA;VENTOLIN HFA) 108 (90 BASE) MCG/ACT inhaler Inhale 1 puff into the lungs every 6 (six) hours as needed for wheezing.  1 Inhaler  11  . fluticasone (FLONASE) 50 MCG/ACT nasal spray Place 2 sprays into both nostrils daily.      . Fluticasone-Salmeterol (ADVAIR DISKUS) 250-50 MCG/DOSE AEPB Inhale 1 puff into the lungs 2 (two) times daily.  60 each  3  . montelukast (SINGULAIR) 10 MG tablet TAKE 1 TABLET BY MOUTH AT BEDTIME  90 tablet  1  . nystatin (MYCOSTATIN) 100000 UNIT/ML suspension TAKE 5 MLS (500,000 UNITS TOTAL) BY MOUTH 4 (FOUR) TIMES DAILY.  60 mL  0  . potassium chloride SA (K-DUR,KLOR-CON) 20 MEQ tablet Take 1 tablet (20 mEq total) by mouth daily.  30 tablet  3   No current facility-administered medications on file prior to visit.    Allergies  Allergen Reactions  . Amoxicillin-Pot Clavulanate  REACTION: gi upset  . Cefdinir     REACTION: gi  upset  . Moxifloxacin     REACTION: rash    Past Medical History  Diagnosis Date  . Allergic rhinitis due to pollen   . Hypertension   . H/O seasonal allergies   . Mild diastolic dysfunction 7/61/9509  . Acute respiratory failure with hypoxia and hypercapnea 02/26/2014    Past Surgical History  Procedure Laterality Date  . Cholecystectomy      Open    Family History  Problem Relation Age of Onset  . Glaucoma Brother   . Vascular Disease Father     Died of complications from surgery  . Asthma Mother     Died of asthma attack  . Dementia Mother     History   Social History  . Marital Status: Married    Spouse Name: N/A    Number of Children:  N/A  . Years of Education: N/A   Occupational History  . Not on file.   Social History Main Topics  . Smoking status: Former Smoker -- 1.00 packs/day for 40 years    Types: Cigarettes    Quit date: 02/22/2014  . Smokeless tobacco: Never Used  . Alcohol Use: No  . Drug Use: No  . Sexual Activity: No   Other Topics Concern  . Not on file   Social History Narrative   Married with 2 children.  Independent of ADLs.   The PMH, PSH, Social History, Family History, Medications, and allergies have been reviewed in [facility], and have been updated if relevant.    Review of Systems    See HPI  Objective:    BP 120/78  Pulse 90  Temp(Src) 98.2 F (36.8 C) (Oral)  Ht _0  (1.6 m)  Wt 159 lb 8 oz (72.349 kg)  BMI 28.26 kg/m2  SpO2 95%   Physical Exam   General:  Well-developed,well-nourished,in no acute distress; alert,appropriate and cooperative throughout examination Head:  normocephalic and atraumatic.   Eyes:  vision grossly intact, pupils equal, pupils round, and pupils reactive to light.   Ears:  R ear normal and L ear normal.   Nose:  no external deformity.   Mouth:  good dentition.   Neck:  No deformities, masses, or tenderness noted. Breasts:  No mass, nodules, thickening, tenderness, bulging, retraction, inflamation, nipple discharge or skin changes noted.   Lungs:  Normal respiratory effort, chest expands symmetrically. Lungs are clear to auscultation, no crackles or wheezes. Heart:  Normal rate and regular rhythm. S1 and S2 normal without gallop, murmur, click, rub or other extra sounds. Abdomen:  Bowel sounds positive,abdomen soft and non-tender without masses, organomegaly or hernias noted. Rectal:  no external abnormalities.   Genitalia:  Pelvic Exam:        External: normal female genitalia without lesions or masses        Vagina: normal without lesions or masses        Cervix: normal without lesions or masses        Adnexa: normal bimanual exam without masses  or fullness        Uterus: normal by palpation        Pap smear: performed Msk:  No deformity or scoliosis noted of thoracic or lumbar spine.   Extremities:  No clubbing, cyanosis, edema, or deformity noted with normal full range of motion of all joints.   Neurologic:  alert & oriented X3 and gait normal.   Skin:  Intact without  suspicious lesions or rashes Cervical Nodes:  No lymphadenopathy noted Axillary Nodes:  No palpable lymphadenopathy Psych:  Cognition and judgment appear intact. Alert and cooperative with normal attention span and concentration. No apparent delusions, illusions, hallucinations       Assessment & Plan:   Welcome to Medicare preventive visit - Plan: EKG 12-Lead  Essential hypertension  COPD II   Acute respiratory failure with hypoxia and hypercapnea  Encounter for routine gynecological examination No Follow-up on file.

## 2014-05-03 NOTE — Assessment & Plan Note (Signed)
Pap smear done today. Mammogram and DEXA ordered- pt will call to schedule.

## 2014-05-03 NOTE — Assessment & Plan Note (Signed)
The patients weight, height, BMI and visual acuity have been recorded in the chart I have made referrals, counseling and provided education to the patient based review of the above and I have provided the pt with a written personalized care plan for preventive services.  Influenza vaccine given today. Declines colonoscopy. Will to do IFOB- order entered [employer] will not pay for zostavax.

## 2014-05-03 NOTE — Patient Instructions (Addendum)
Please call to set up your mammogram. Please stop taking your potassium.  On your way out, ask them to schedule a lab visit in two weeks.  Please stop by the lab to get stool cards.

## 2014-05-03 NOTE — Assessment & Plan Note (Signed)
Well controlled without rx. Will d/c potassium since she is no longer on HCTZ- potassium is low normal- increase foods higher in potassium and follow up BMET in 2 weeks.

## 2014-05-04 LAB — CYTOLOGY - PAP

## 2014-05-08 ENCOUNTER — Encounter: Payer: Self-pay | Admitting: *Deleted

## 2014-05-08 ENCOUNTER — Other Ambulatory Visit: Payer: Self-pay | Admitting: Family Medicine

## 2014-05-08 DIAGNOSIS — R7989 Other specified abnormal findings of blood chemistry: Secondary | ICD-10-CM

## 2014-05-09 ENCOUNTER — Encounter: Payer: Self-pay | Admitting: *Deleted

## 2014-05-09 ENCOUNTER — Other Ambulatory Visit (INDEPENDENT_AMBULATORY_CARE_PROVIDER_SITE_OTHER): Payer: Medicare HMO

## 2014-05-09 DIAGNOSIS — Z1211 Encounter for screening for malignant neoplasm of colon: Secondary | ICD-10-CM

## 2014-05-09 LAB — FECAL OCCULT BLOOD, IMMUNOCHEMICAL: Fecal Occult Bld: NEGATIVE

## 2014-05-12 ENCOUNTER — Encounter: Payer: Self-pay | Admitting: Family Medicine

## 2014-05-16 ENCOUNTER — Encounter: Payer: Self-pay | Admitting: Family Medicine

## 2014-05-17 ENCOUNTER — Other Ambulatory Visit (INDEPENDENT_AMBULATORY_CARE_PROVIDER_SITE_OTHER): Payer: Medicare HMO

## 2014-05-17 DIAGNOSIS — R7989 Other specified abnormal findings of blood chemistry: Secondary | ICD-10-CM

## 2014-05-17 LAB — BASIC METABOLIC PANEL
BUN: 13 mg/dL (ref 6–23)
CO2: 28 mEq/L (ref 19–32)
CREATININE: 1 mg/dL (ref 0.4–1.2)
Calcium: 8.9 mg/dL (ref 8.4–10.5)
Chloride: 105 mEq/L (ref 96–112)
GFR: 62.54 mL/min (ref >60.00)
Glucose, Bld: 110 mg/dL — ABNORMAL HIGH (ref 70–99)
POTASSIUM: 3.8 meq/L (ref 3.5–5.1)
Sodium: 142 mEq/L (ref 135–145)

## 2014-06-23 ENCOUNTER — Other Ambulatory Visit: Payer: Self-pay

## 2014-08-07 ENCOUNTER — Telehealth: Payer: Self-pay | Admitting: *Deleted

## 2014-08-07 MED ORDER — HYDROCHLOROTHIAZIDE 12.5 MG PO CAPS: 12.5000 mg | ORAL_CAPSULE | Freq: Every day | ORAL | Status: DC

## 2014-08-07 NOTE — Telephone Encounter (Signed)
Please call pt to verify what she is taking.

## 2014-08-07 NOTE — Telephone Encounter (Signed)
Ok to refill if she is monitoring her BP closely.

## 2014-08-07 NOTE — Telephone Encounter (Signed)
Spoke to pt who states that she is taking 12.56m of hctz. Pt states that it was d/c after hospital d/c but was advised to "take it if she needed to," based on BP readings.

## 2014-08-07 NOTE — Telephone Encounter (Signed)
Received a faxed refill request from pharmacy for HCTZ 12.5 mg, take one capsule daily. Medication is no longer on medication list. Is it okay to refill medication?

## 2014-08-15 ENCOUNTER — Encounter: Payer: Self-pay | Admitting: Family Medicine

## 2014-08-15 ENCOUNTER — Ambulatory Visit (INDEPENDENT_AMBULATORY_CARE_PROVIDER_SITE_OTHER): Payer: Medicare HMO | Admitting: Family Medicine

## 2014-08-15 VITALS — BP 116/70 | HR 102 | Temp 98.3°F | Wt 158.0 lb

## 2014-08-15 DIAGNOSIS — IMO0001 Reserved for inherently not codable concepts without codable children: Secondary | ICD-10-CM

## 2014-08-15 DIAGNOSIS — J449 Chronic obstructive pulmonary disease, unspecified: Secondary | ICD-10-CM

## 2014-08-15 DIAGNOSIS — F411 Generalized anxiety disorder: Secondary | ICD-10-CM

## 2014-08-15 MED ORDER — HYDROCODONE-HOMATROPINE 5-1.5 MG/5ML PO SYRP: 5.0000 mL | ORAL_SOLUTION | Freq: Three times a day (TID) | ORAL | Status: DC | PRN

## 2014-08-15 MED ORDER — AZITHROMYCIN 250 MG PO TABS: ORAL_TABLET | ORAL | Status: DC

## 2014-08-15 NOTE — Progress Notes (Signed)
Subjective:   Patient ID: Linda Stevens, female    DOB: 1946/07/05, 68 y.o.   MRN: [id number]  Linda Stevens is a pleasant 68 y.o. year old female with h/o COPD, who presents to clinic today with Cough  on 08/15/2014  HPI: Here for worsening cough. She is very anxious about this given her h/o hospitalization for pneumonia.  Started with runny nose, congestion 2 weeks ago.  Her grand kids were sick and she thinks she got this from them. Those symptoms improved but over last few days, SOB, wheezing and cough have developed.  Cough is dry but she feels she does have some mucous in her throat. No fevers. No CP. Using her rescue inhaler three times daily over past few days, which does improve her symptoms somewhat.  No night time awakenings.  Overall, has been very healthy- no recent exacerbations.  Since she quit smoking, she feels healthier in general. Current Outpatient Prescriptions on File Prior to Visit  Medication Sig Dispense Refill  . Acetaminophen-Codeine 300-30 MG per tablet Take 1 tablet by mouth every 4 (four) hours as needed (Body aches with cough). 30 tablet 0  . albuterol (PROVENTIL HFA;VENTOLIN HFA) 108 (90 BASE) MCG/ACT inhaler Inhale 1 puff into the lungs every 6 (six) hours as needed for wheezing. 1 Inhaler 11  . fluticasone (FLONASE) 50 MCG/ACT nasal spray Place 2 sprays into both nostrils daily.    . hydrochlorothiazide (MICROZIDE) 12.5 MG capsule Take 1 capsule (12.5 mg total) by mouth daily. 30 capsule 0  . montelukast (SINGULAIR) 10 MG tablet TAKE 1 TABLET BY MOUTH AT BEDTIME 90 tablet 1   No current facility-administered medications on file prior to visit.    Allergies  Allergen Reactions  . Amoxicillin-Pot Clavulanate     REACTION: gi upset  . Cefdinir     REACTION: gi  upset  . Moxifloxacin     REACTION: rash    Past Medical History  Diagnosis Date  . Allergic rhinitis due to pollen   . Hypertension   . H/O seasonal allergies   . Mild  diastolic dysfunction 8/33/3832  . Acute respiratory failure with hypoxia and hypercapnea 02/26/2014    Past Surgical History  Procedure Laterality Date  . Cholecystectomy      Open    Family History  Problem Relation Age of Onset  . Glaucoma Brother   . Vascular Disease Father     Died of complications from surgery  . Asthma Mother     Died of asthma attack  . Dementia Mother     History   Social History  . Marital Status: Married    Spouse Name: N/A    Number of Children: N/A  . Years of Education: N/A   Occupational History  . Not on file.   Social History Main Topics  . Smoking status: Former Smoker -- 1.00 packs/day for 40 years    Types: Cigarettes    Quit date: 02/22/2014  . Smokeless tobacco: Never Used  . Alcohol Use: No  . Drug Use: No  . Sexual Activity: No   Other Topics Concern  . Not on file   Social History Narrative   Married with 2 children.  Independent of ADLs.      Does not have a living will.   Would desire CPR but would not want prolonged life support if futile- husband aware.   The PMH, PSH, Social History, Family History, Medications, and allergies have been reviewed in [facility], and  have been updated if relevant.   Review of Systems  Constitutional: Negative for fever, chills and diaphoresis.  HENT: Positive for congestion and postnasal drip. Negative for sore throat and trouble swallowing.   Respiratory: Positive for cough, shortness of breath and wheezing. Negative for choking and stridor.   Cardiovascular: Negative for chest pain.  Gastrointestinal: Negative.   Musculoskeletal: Negative.   Skin: Negative.   Allergic/Immunologic: Negative.   Neurological: Negative.   Psychiatric/Behavioral: The patient is nervous/anxious.   All other systems reviewed and are negative.      Objective:    BP 116/70 mmHg  Pulse 102  Temp(Src) 98.3 F (36.8 C) (Oral)  Wt 158 lb (71.668 kg)  SpO2 93%   Physical Exam  Constitutional: She is  oriented to person, place, and time. She appears well-developed and well-nourished. No distress.  HENT:  Head: Normocephalic.  Right Ear: Hearing and tympanic membrane normal.  Left Ear: Hearing and tympanic membrane normal.  Nose: Rhinorrhea present. Right sinus exhibits no maxillary sinus tenderness and no frontal sinus tenderness. Left sinus exhibits no maxillary sinus tenderness and no frontal sinus tenderness.  Cardiovascular: Regular rhythm.   tachycardic  Pulmonary/Chest: Effort normal and breath sounds normal. No respiratory distress. She has no wheezes. She has no rales.  Musculoskeletal: Normal range of motion.  Neurological: She is alert and oriented to person, place, and time.  Skin: Skin is warm and dry.  Psychiatric:  Tearful, anxious  Nursing note and vitals reviewed.         Assessment & Plan:   No diagnosis found. No Follow-up on file.

## 2014-08-15 NOTE — Assessment & Plan Note (Signed)
New- lungs actually sound quite good today- great air movement without rhonchi or wheezing but she states that she just used her inhaler before coming in today. She feels she is wheezing quite a bit and night time cough has been "exhausting."  Given history and progression of symptoms, will treat for bacterial bronchitis with zpack (multiple abx allergies).  Also given Hycodan rx to use for night time cough.  Sedation precautions discussed.

## 2014-08-15 NOTE — Assessment & Plan Note (Signed)
Deteriorated- has significant anxiety concerning her health, especially respiratory symptoms since her hospitalization for hypoxic respiratory failure, which is completely understanding. We spent much our office visit discussing this- reassurance provided.

## 2014-08-15 NOTE — Progress Notes (Signed)
Pre visit review using our clinic review tool, if applicable. No additional management support is needed unless otherwise documented below in the visit note. 

## 2014-08-15 NOTE — Patient Instructions (Signed)
Great to see you. Your lungs sound quite good. Take zpack as directed, continue with your as needed albuterol inhaler. Hycodan as needed at bedtime.

## 2014-08-18 ENCOUNTER — Other Ambulatory Visit: Payer: Self-pay | Admitting: *Deleted

## 2014-08-18 MED ORDER — FLUTICASONE PROPIONATE 50 MCG/ACT NA SUSP: 2.0000 | Freq: Every day | NASAL | Status: DC

## 2014-09-06 ENCOUNTER — Other Ambulatory Visit: Payer: Self-pay | Admitting: *Deleted

## 2014-09-06 DIAGNOSIS — J301 Allergic rhinitis due to pollen: Secondary | ICD-10-CM

## 2014-09-06 MED ORDER — MONTELUKAST SODIUM 10 MG PO TABS: 10.0000 mg | ORAL_TABLET | Freq: Every day | ORAL | Status: DC

## 2014-09-06 MED ORDER — HYDROCHLOROTHIAZIDE 12.5 MG PO CAPS: 12.5000 mg | ORAL_CAPSULE | Freq: Every day | ORAL | Status: DC

## 2014-10-31 ENCOUNTER — Other Ambulatory Visit: Payer: Self-pay | Admitting: Family Medicine

## 2014-11-03 ENCOUNTER — Telehealth: Payer: Self-pay

## 2014-11-03 NOTE — Telephone Encounter (Signed)
Pt left v/m requesting refill for proair inhaler 8.5 gm inhaling 2 puffs into lungs q6h prn for wheezing to [facility]. [facility] sent fax requesting refill as well.Please advise.  Do not see on current or hx med list. Pt request cb when refilled.

## 2014-11-06 ENCOUNTER — Telehealth: Payer: Self-pay | Admitting: *Deleted

## 2014-11-06 MED ORDER — ALBUTEROL SULFATE HFA 108 (90 BASE) MCG/ACT IN AERS: 1.0000 | INHALATION_SPRAY | Freq: Four times a day (QID) | RESPIRATORY_TRACT | Status: DC | PRN

## 2014-11-06 NOTE — Telephone Encounter (Signed)
Pro air rx sent to pharmacy.

## 2014-11-06 NOTE — Telephone Encounter (Signed)
Refill requested for proair 8.5gm; 2 puffs q 6hrs prn. . Medication not on pts current or Hx list. pls advise.

## 2014-12-04 ENCOUNTER — Other Ambulatory Visit: Payer: Self-pay | Admitting: Family Medicine

## 2015-01-09 ENCOUNTER — Ambulatory Visit (INDEPENDENT_AMBULATORY_CARE_PROVIDER_SITE_OTHER): Payer: Medicare HMO | Admitting: Family Medicine

## 2015-01-09 ENCOUNTER — Encounter: Payer: Self-pay | Admitting: Family Medicine

## 2015-01-09 VITALS — BP 122/72 | HR 102 | Temp 98.0°F | Wt 156.5 lb

## 2015-01-09 DIAGNOSIS — J449 Chronic obstructive pulmonary disease, unspecified: Secondary | ICD-10-CM | POA: Diagnosis not present

## 2015-01-09 DIAGNOSIS — IMO0001 Reserved for inherently not codable concepts without codable children: Secondary | ICD-10-CM

## 2015-01-09 MED ORDER — HYDROCODONE-HOMATROPINE 5-1.5 MG/5ML PO SYRP: 5.0000 mL | ORAL_SOLUTION | Freq: Three times a day (TID) | ORAL | Status: DC | PRN

## 2015-01-09 MED ORDER — PREDNISONE 10 MG PO TABS: ORAL_TABLET | ORAL | Status: DC

## 2015-01-09 MED ORDER — AZITHROMYCIN 250 MG PO TABS: ORAL_TABLET | ORAL | Status: DC

## 2015-01-09 NOTE — Progress Notes (Signed)
Subjective:   Patient ID: Linda Stevens, female    DOB: 1945/09/18, 69 y.o.   MRN: [id number]  PATRISIA FAETH is a pleasant 69 y.o. year old female with h/o COPD, who presents to clinic today with multiple concerns, including a Cough  on 01/09/2015  HPI: Here for worsening cough, congestion. She is very anxious about this given her h/o hospitalization for pneumonia in past.  Started with runny nose, congestion 3 or 4 weeks ago. This has progressed to SOB, wheezing and cough over past few days. Cough is dry but she feels she does have some mucous in her throat. No fevers. No CP. Using her rescue inhaler three times daily over past few days, which does improve her symptoms somewhat.  No night time awakenings.  She is taking singulair and flonase as well.  She has a lot of questions about her cough and why she was taken off of advair by pulmonary in past. Current Outpatient Prescriptions on File Prior to Visit  Medication Sig Dispense Refill  . albuterol (PROVENTIL HFA;VENTOLIN HFA) 108 (90 BASE) MCG/ACT inhaler Inhale 1 puff into the lungs every 6 (six) hours as needed for wheezing. 1 Inhaler 11  . fluticasone (FLONASE) 50 MCG/ACT nasal spray PLACE 2 SPRAYS INTO BOTH NOSTRILS DAILY 16 g 5  . hydrochlorothiazide (MICROZIDE) 12.5 MG capsule TAKE 1 CAPSULE BY MOUTH DAILY 30 capsule 5  . montelukast (SINGULAIR) 10 MG tablet Take 1 tablet (10 mg total) by mouth at bedtime. 90 tablet 1   No current facility-administered medications on file prior to visit.    Allergies  Allergen Reactions  . Amoxicillin-Pot Clavulanate     REACTION: gi upset  . Cefdinir     REACTION: gi  upset  . Moxifloxacin     REACTION: rash    Past Medical History  Diagnosis Date  . Allergic rhinitis due to pollen   . Hypertension   . H/O seasonal allergies   . Mild diastolic dysfunction 4/48/1856  . Acute respiratory failure with hypoxia and hypercapnea 02/26/2014    Past Surgical History    Procedure Laterality Date  . Cholecystectomy      Open    Family History  Problem Relation Age of Onset  . Glaucoma Brother   . Vascular Disease Father     Died of complications from surgery  . Asthma Mother     Died of asthma attack  . Dementia Mother     History   Social History  . Marital Status: Married    Spouse Name: N/A  . Number of Children: N/A  . Years of Education: N/A   Occupational History  . Not on file.   Social History Main Topics  . Smoking status: Former Smoker -- 1.00 packs/day for 40 years    Types: Cigarettes    Quit date: 02/22/2014  . Smokeless tobacco: Never Used  . Alcohol Use: No  . Drug Use: No  . Sexual Activity: No   Other Topics Concern  . Not on file   Social History Narrative   Married with 2 children.  Independent of ADLs.      Does not have a living will.   Would desire CPR but would not want prolonged life support if futile- husband aware.   The PMH, PSH, Social History, Family History, Medications, and allergies have been reviewed in [facility], and have been updated if relevant.   Review of Systems  Constitutional: Negative for fever, chills and diaphoresis.  HENT:  Positive for congestion and postnasal drip. Negative for sore throat and trouble swallowing.   Respiratory: Positive for cough, shortness of breath and wheezing. Negative for choking and stridor.   Cardiovascular: Negative for chest pain.  Gastrointestinal: Negative.   Musculoskeletal: Negative.   Skin: Negative.   Allergic/Immunologic: Negative.   Neurological: Negative.   Psychiatric/Behavioral: The patient is nervous/anxious.   All other systems reviewed and are negative.      Objective:    BP 122/72 mmHg  Pulse 102  Temp(Src) 98 F (36.7 C) (Oral)  Wt 156 lb 8 oz (70.988 kg)  SpO2 93%   Physical Exam  Constitutional: She is oriented to person, place, and time. She appears well-developed and well-nourished. No distress.  HENT:  Head:  Normocephalic.  Right Ear: Hearing and tympanic membrane normal.  Left Ear: Hearing and tympanic membrane normal.  Nose: Rhinorrhea present. Right sinus exhibits no maxillary sinus tenderness and no frontal sinus tenderness. Left sinus exhibits no maxillary sinus tenderness and no frontal sinus tenderness.  Cardiovascular: Regular rhythm.   tachycardic  Pulmonary/Chest: Effort normal. No respiratory distress. She has wheezes. She has no rales.  Musculoskeletal: Normal range of motion.  Neurological: She is alert and oriented to person, place, and time.  Skin: Skin is warm and dry.  Psychiatric:  Anxious  Nursing note and vitals reviewed.         Assessment & Plan:   No diagnosis found. No Follow-up on file.

## 2015-01-09 NOTE — Progress Notes (Signed)
Pre visit review using our clinic review tool, if applicable. No additional management support is needed unless otherwise documented below in the visit note.

## 2015-01-09 NOTE — Patient Instructions (Signed)
Good to see you. Please take prednisone in the morning (and with food).  Take zpack as directed.  Please call me with an update.

## 2015-01-09 NOTE — Assessment & Plan Note (Signed)
New- >25 minutes spent in face to face time with patient, >50% spent in counselling or coordination of care Reviewed Dr. Gustavus Bryant notes with her, including note from 03/2014 that stated he felt she only needed prn SABA. Has been doing very well until this recent episode.  She does have wheezes on exam.  Treat with prednisone taper and zpack. Will not restart inhaled steroid at this point since this has been her first exacerbation in months.  Call or return to clinic prn if these symptoms worsen or fail to improve as anticipated. The patient indicates understanding of these issues and agrees with the plan.

## 2015-03-03 ENCOUNTER — Other Ambulatory Visit: Payer: Self-pay | Admitting: Family Medicine

## 2015-04-17 ENCOUNTER — Telehealth: Payer: Self-pay | Admitting: *Deleted

## 2015-04-17 NOTE — Telephone Encounter (Signed)
Mammogram f/u call: left message with family requesting pt to call office back regarding status of mammogram

## 2015-04-18 ENCOUNTER — Telehealth: Payer: Self-pay | Admitting: Family Medicine

## 2015-04-18 DIAGNOSIS — Z1239 Encounter for other screening for malignant neoplasm of breast: Secondary | ICD-10-CM

## 2015-04-18 DIAGNOSIS — Z13820 Encounter for screening for osteoporosis: Secondary | ICD-10-CM

## 2015-04-18 NOTE — Addendum Note (Signed)
Addended by: Jearld Fenton on: 04/18/2015 12:23 PM   Modules accepted: Orders

## 2015-04-18 NOTE — Telephone Encounter (Signed)
Mammogram and Bone Density have been ordered, she should be able to call and schedule appt

## 2015-04-18 NOTE — Telephone Encounter (Signed)
Spoke to pt and informed her orders were placed and she may contact them to schedule.

## 2015-04-18 NOTE — Telephone Encounter (Signed)
Patient was called about her mammogram being scheduled.  Patient would like to have a screening mammogram and dexa scan done at the same time. Patient goes to [facility] on [address] in [place]. Patient wants to schedule her own appointment. Please send message to my chart when she can schedule appointment.

## 2015-05-03 ENCOUNTER — Other Ambulatory Visit: Payer: Self-pay | Admitting: Family Medicine

## 2015-05-11 ENCOUNTER — Encounter: Payer: Self-pay | Admitting: Family Medicine

## 2015-07-09 ENCOUNTER — Telehealth: Payer: Self-pay | Admitting: Family Medicine

## 2015-07-09 NOTE — Telephone Encounter (Signed)
Pt called wanting to get an appointment today or tomorrow. She has a sinus infection and bronchitis.  Can i use one of the same days tomorrow.  She stated she is starting to wheeze.

## 2015-07-09 NOTE — Telephone Encounter (Signed)
That should be ok

## 2015-07-09 NOTE — Telephone Encounter (Signed)
11/1 @ 11:45 Pt aware

## 2015-07-10 ENCOUNTER — Ambulatory Visit (INDEPENDENT_AMBULATORY_CARE_PROVIDER_SITE_OTHER): Payer: Medicare HMO | Admitting: Family Medicine

## 2015-07-10 ENCOUNTER — Encounter: Payer: Self-pay | Admitting: Family Medicine

## 2015-07-10 VITALS — BP 104/70 | HR 101 | Temp 97.6°F | Wt 161.0 lb

## 2015-07-10 DIAGNOSIS — J449 Chronic obstructive pulmonary disease, unspecified: Secondary | ICD-10-CM

## 2015-07-10 DIAGNOSIS — IMO0001 Reserved for inherently not codable concepts without codable children: Secondary | ICD-10-CM

## 2015-07-10 MED ORDER — FLUTICASONE-SALMETEROL 100-50 MCG/DOSE IN AEPB: 1.0000 | INHALATION_SPRAY | Freq: Two times a day (BID) | RESPIRATORY_TRACT | Status: DC

## 2015-07-10 MED ORDER — PREDNISONE 10 MG PO TABS: ORAL_TABLET | ORAL | Status: DC

## 2015-07-10 MED ORDER — HYDROCODONE-HOMATROPINE 5-1.5 MG/5ML PO SYRP: 5.0000 mL | ORAL_SOLUTION | Freq: Three times a day (TID) | ORAL | Status: DC | PRN

## 2015-07-10 MED ORDER — AZITHROMYCIN 250 MG PO TABS: ORAL_TABLET | ORAL | Status: AC

## 2015-07-10 NOTE — Progress Notes (Signed)
Subjective:   Patient ID: Linda Stevens, female    DOB: March 02, 1946, 69 y.o.   MRN: [id number]  Linda Stevens is a pleasant 69 y.o. year old female with h/o COPD, who presents to clinic today with multiple concerns, including a Nasal Congestion and Cough  on 07/10/2015  HPI: Here for worsening cough, congestion. She is very anxious about this given her h/o hospitalization for pneumonia in past.    Started with runny nose, congestion for 2 months.   This has progressed to SOB, wheezing and cough over past four  days. Cough is dry but she feels she does have some mucous in her throat. No fevers.  Using her rescue inhaler three times daily over past few days, which does improve her symptoms somewhat.  No night time awakenings.  She is taking  flonase as well.  Stopped taking Singulair two weeks ago because she thought it was causing itching which resolved after she stopped taking it.  Previously took advair but once symptoms controlled stopped taking it as she felt it was causing thrush.  Current Outpatient Prescriptions on File Prior to Visit  Medication Sig Dispense Refill  . albuterol (PROVENTIL HFA;VENTOLIN HFA) 108 (90 BASE) MCG/ACT inhaler Inhale 1 puff into the lungs every 6 (six) hours as needed for wheezing. 1 Inhaler 11  . fluticasone (FLONASE) 50 MCG/ACT nasal spray PLACE 2 SPRAYS INTO BOTH NOSTRILS DAILY 16 g 5  . hydrochlorothiazide (MICROZIDE) 12.5 MG capsule TAKE ONE CAPSULE BY MOUTH DAILY 30 capsule 5  . montelukast (SINGULAIR) 10 MG tablet TAKE 1 TABLET BY MOUTH AT BEDTIME 90 tablet 0   No current facility-administered medications on file prior to visit.    Allergies  Allergen Reactions  . Amoxicillin-Pot Clavulanate     REACTION: gi upset  . Cefdinir     REACTION: gi  upset  . Moxifloxacin     REACTION: rash    Past Medical History  Diagnosis Date  . Allergic rhinitis due to pollen   . Hypertension   . H/O seasonal allergies   . Mild diastolic  dysfunction 03/02/6388  . Acute respiratory failure with hypoxia and hypercapnea 02/26/2014    Past Surgical History  Procedure Laterality Date  . Cholecystectomy      Open    Family History  Problem Relation Age of Onset  . Glaucoma Brother   . Vascular Disease Father     Died of complications from surgery  . Asthma Mother     Died of asthma attack  . Dementia Mother     Social History   Social History  . Marital Status: Married    Spouse Name: N/A  . Number of Children: N/A  . Years of Education: N/A   Occupational History  . Not on file.   Social History Main Topics  . Smoking status: Former Smoker -- 1.00 packs/day for 40 years    Types: Cigarettes    Quit date: 02/22/2014  . Smokeless tobacco: Never Used  . Alcohol Use: No  . Drug Use: No  . Sexual Activity: No   Other Topics Concern  . Not on file   Social History Narrative   Married with 2 children.  Independent of ADLs.      Does not have a living will.   Would desire CPR but would not want prolonged life support if futile- husband aware.   The PMH, PSH, Social History, Family History, Medications, and allergies have been reviewed in [facility], and have  been updated if relevant.   Review of Systems  Constitutional: Negative for fever, chills and diaphoresis.  HENT: Positive for congestion and postnasal drip. Negative for sore throat and trouble swallowing.   Respiratory: Positive for cough, shortness of breath and wheezing. Negative for choking and stridor.   Cardiovascular: Negative for chest pain.  Gastrointestinal: Negative.   Musculoskeletal: Negative.   Skin: Negative.   Allergic/Immunologic: Negative.   Neurological: Negative.   Psychiatric/Behavioral: The patient is nervous/anxious.   All other systems reviewed and are negative.      Objective:    BP 104/70 mmHg  Pulse 101  Temp(Src) 97.6 F (36.4 C) (Oral)  Wt 161 lb (73.029 kg)  SpO2 90%   Physical Exam  Constitutional: She is  oriented to person, place, and time. She appears well-developed and well-nourished. No distress.  HENT:  Head: Normocephalic.  Right Ear: Hearing and tympanic membrane normal.  Left Ear: Hearing and tympanic membrane normal.  Nose: Rhinorrhea present. Right sinus exhibits no maxillary sinus tenderness and no frontal sinus tenderness. Left sinus exhibits no maxillary sinus tenderness and no frontal sinus tenderness.  Cardiovascular: Regular rhythm.   tachycardic  Pulmonary/Chest: Effort normal. No respiratory distress. She has wheezes. She has no rales.  Musculoskeletal: Normal range of motion.  Neurological: She is alert and oriented to person, place, and time.  Skin: Skin is warm and dry.  Psychiatric:  Anxious  Nursing note and vitals reviewed.         Assessment & Plan:   COPD with bronchitis No Follow-up on file.

## 2015-07-10 NOTE — Progress Notes (Signed)
Pre visit review using our clinic review tool, if applicable. No additional management support is needed unless otherwise documented below in the visit note.

## 2015-07-10 NOTE — Patient Instructions (Signed)
Good to see you. Take zpack and prednisone as directed. Cough suppressant as needed for cough.

## 2015-07-10 NOTE — Assessment & Plan Note (Signed)
Deteriorated. >25 minutes spent in face to face time with patient, >50% spent in counselling or coordination of care as she is quite anxious again today.  Per Dr. Melvyn Novas, he felt that she only needed prn SABA but we could certainly revisit inhaled steroid.  singulair d/c'd- added to allergy list.  She does have wheezes on exam- treat with prednisone dose pack and zpack.  Hycodan prn cough.  She does want to hold off on restarting inhaled steroid but I did send in eRx in case she changes her mind and she will keep me updated.

## 2015-07-11 ENCOUNTER — Ambulatory Visit: Payer: Medicare HMO | Admitting: Family Medicine

## 2015-08-21 ENCOUNTER — Ambulatory Visit (INDEPENDENT_AMBULATORY_CARE_PROVIDER_SITE_OTHER): Payer: Medicare HMO

## 2015-08-21 DIAGNOSIS — Z23 Encounter for immunization: Secondary | ICD-10-CM | POA: Diagnosis not present

## 2015-09-11 ENCOUNTER — Ambulatory Visit (INDEPENDENT_AMBULATORY_CARE_PROVIDER_SITE_OTHER): Payer: Medicare HMO | Admitting: Family Medicine

## 2015-09-11 ENCOUNTER — Encounter: Payer: Self-pay | Admitting: Family Medicine

## 2015-09-11 VITALS — BP 114/62 | HR 104 | Temp 98.2°F | Wt 167.2 lb

## 2015-09-11 DIAGNOSIS — R609 Edema, unspecified: Secondary | ICD-10-CM | POA: Diagnosis not present

## 2015-09-11 DIAGNOSIS — I10 Essential (primary) hypertension: Secondary | ICD-10-CM

## 2015-09-11 DIAGNOSIS — I959 Hypotension, unspecified: Secondary | ICD-10-CM

## 2015-09-11 DIAGNOSIS — R600 Localized edema: Secondary | ICD-10-CM | POA: Insufficient documentation

## 2015-09-11 NOTE — Assessment & Plan Note (Signed)
Likely multifactorial from weight gain, increase salt with holiday Hams,bilateral varicose veins with insufficiency, decreased exercise. Continue HCTZ.  Start compression hose, elevate legs, weight loss, increase exercise. Will check cbc, CMET and TSH to rule out other etiologies.

## 2015-09-11 NOTE — Assessment & Plan Note (Signed)
BP slightly low at home, but asymptomatic. Will continue current med as D/C of HCTZ likely to make BP elevate as in past and to cause worsening of edema.

## 2015-09-11 NOTE — Patient Instructions (Addendum)
Stop at lab on way out. We will call with results. Wear comperssion hose. Decrease salt in diet. Elevate feet when able to during the day and at night. Start walking more. Work on weight loss overall. Continue HCTZ low dose.

## 2015-09-11 NOTE — Progress Notes (Signed)
Subjective:    Patient ID: Linda Stevens, female    DOB: 12-24-1945, 70 y.o.   MRN: [id number]  HPI  70 year old female pt of Dr. Hulen Shouts with history of HTN, COPD anxiety presents with hypotension.  She reports he has noted low BPs in last  She is using HCTZ 12.5 mg daily. At home BP < 104/70. Only light headeded only when bending over a lot.  She has noted increase in fluid in ankles in last few weeks. Better in morning when wakes up after feet are up.  She has not been able to exercise. Over the holiday.. She has had more salts and lots of HAMs.   Noted low  BP in past.. Noted in 04/2014. She tried to stop but BP went back up.   He is on HCTZ low dose. BP Readings from Last 3 Encounters:  09/11/15 114/62  07/10/15 104/70  01/09/15 122/72   Wt Readings from Last 3 Encounters:  09/11/15 167 lb 4 oz (75.864 kg)  07/10/15 161 lb (73.029 kg)  01/09/15 156 lb 8 oz (70.988 kg)     No current SOB, occ cough, clear mucus.  No chest pain with exertion.  Social History /Family History/Past Medical History reviewed and updated if needed.   Review of Systems  Constitutional: Negative for fever and fatigue.  HENT: Negative for ear pain.   Eyes: Negative for pain.  Respiratory: Negative for chest tightness and shortness of breath.   Cardiovascular: Positive for leg swelling. Negative for chest pain and palpitations.  Gastrointestinal: Negative for abdominal pain.  Genitourinary: Negative for dysuria.       Objective:   Physical Exam  Constitutional: Vital signs are normal. She appears well-developed and well-nourished. She is cooperative.  Non-toxic appearance. She does not appear ill. No distress.  HENT:  Head: Normocephalic.  Right Ear: Hearing, tympanic membrane, external ear and ear canal normal. Tympanic membrane is not erythematous, not retracted and not bulging.  Left Ear: Hearing, tympanic membrane, external ear and ear canal normal. Tympanic membrane is  not erythematous, not retracted and not bulging.  Nose: No mucosal edema or rhinorrhea. Right sinus exhibits no maxillary sinus tenderness and no frontal sinus tenderness. Left sinus exhibits no maxillary sinus tenderness and no frontal sinus tenderness.  Mouth/Throat: Uvula is midline, oropharynx is clear and moist and mucous membranes are normal.  Eyes: Conjunctivae, EOM and lids are normal. Pupils are equal, round, and reactive to light. Lids are everted and swept, no foreign bodies found.  Neck: Trachea normal and normal range of motion. Neck supple. Carotid bruit is not present. No thyroid mass and no thyromegaly present.  Cardiovascular: Normal rate, regular rhythm, S1 normal, S2 normal, normal heart sounds, intact distal pulses and normal pulses.  Exam reveals no gallop and no friction rub.   No murmur heard. Bilateral 1+ pitting edema in legs to mid calf.  Pulmonary/Chest: Effort normal and breath sounds normal. No tachypnea. No respiratory distress. She has no decreased breath sounds. She has no wheezes. She has no rhonchi. She has no rales.  Abdominal: Soft. Normal appearance and bowel sounds are normal. There is no tenderness.  Neurological: She is alert.  Skin: Skin is warm, dry and intact. No rash noted.  Psychiatric: Her speech is normal and behavior is normal. Judgment and thought content normal. Her mood appears not anxious. Cognition and memory are normal. She does not exhibit a depressed mood.  Assessment & Plan:

## 2015-09-11 NOTE — Progress Notes (Signed)
Pre visit review using our clinic review tool, if applicable. No additional management support is needed unless otherwise documented below in the visit note.

## 2015-09-12 LAB — CBC WITH DIFFERENTIAL/PLATELET
BASOS ABS: 0 10*3/uL (ref 0.0–0.1)
Basophils Relative: 0.4 % (ref 0.0–3.0)
EOS ABS: 0.1 10*3/uL (ref 0.0–0.7)
Eosinophils Relative: 1.3 % (ref 0.0–5.0)
HCT: 51.8 % — ABNORMAL HIGH (ref 36.0–46.0)
Hemoglobin: 16.9 g/dL — ABNORMAL HIGH (ref 12.0–15.0)
LYMPHS ABS: 1.1 10*3/uL (ref 0.7–4.0)
Lymphocytes Relative: 16.8 % (ref 12.0–46.0)
MCHC: 32.6 g/dL (ref 30.0–36.0)
MCV: 100.4 fl — ABNORMAL HIGH (ref 78.0–100.0)
MONO ABS: 0.7 10*3/uL (ref 0.1–1.0)
Monocytes Relative: 10.1 % (ref 3.0–12.0)
NEUTROS PCT: 71.4 % (ref 43.0–77.0)
Neutro Abs: 4.6 10*3/uL (ref 1.4–7.7)
Platelets: 173 10*3/uL (ref 150.0–400.0)
RBC: 5.16 Mil/uL — AB (ref 3.87–5.11)
RDW: 17.7 % — ABNORMAL HIGH (ref 11.5–15.5)
WBC: 6.5 10*3/uL (ref 4.0–10.5)

## 2015-09-12 LAB — COMPREHENSIVE METABOLIC PANEL
ALBUMIN: 3.9 g/dL (ref 3.5–5.2)
ALK PHOS: 76 U/L (ref 39–117)
ALT: 11 U/L (ref 0–35)
AST: 19 U/L (ref 0–37)
BUN: 18 mg/dL (ref 6–23)
CO2: 32 mEq/L (ref 19–32)
Calcium: 9.6 mg/dL (ref 8.4–10.5)
Chloride: 96 mEq/L (ref 96–112)
Creatinine, Ser: 1.02 mg/dL (ref 0.40–1.20)
GFR: 57.39 mL/min — AB (ref >60.00)
Glucose, Bld: 85 mg/dL (ref 70–99)
Potassium: 2.9 mEq/L — ABNORMAL LOW (ref 3.5–5.1)
Sodium: 145 mEq/L (ref 135–145)
TOTAL PROTEIN: 6.5 g/dL (ref 6.0–8.3)
Total Bilirubin: 1.2 mg/dL (ref 0.2–1.2)

## 2015-09-12 LAB — TSH: TSH: 3.29 u[IU]/mL (ref 0.35–4.50)

## 2015-10-01 ENCOUNTER — Inpatient Hospital Stay (HOSPITAL_COMMUNITY)
Admission: EM | Admit: 2015-10-01 | Discharge: 2015-10-09 | DRG: 286 | Disposition: A | Discharge status: Home / Self Care | Payer: Medicare HMO | Attending: Internal Medicine | Admitting: Internal Medicine

## 2015-10-01 ENCOUNTER — Ambulatory Visit (INDEPENDENT_AMBULATORY_CARE_PROVIDER_SITE_OTHER)
Admission: RE | Admit: 2015-10-01 | Discharge: 2015-10-01 | Disposition: A | Discharge status: Home / Self Care | Payer: Medicare HMO | Source: Ambulatory Visit | Attending: Family Medicine | Admitting: Family Medicine

## 2015-10-01 ENCOUNTER — Ambulatory Visit (INDEPENDENT_AMBULATORY_CARE_PROVIDER_SITE_OTHER): Payer: Medicare HMO | Admitting: Family Medicine

## 2015-10-01 ENCOUNTER — Encounter: Payer: Self-pay | Admitting: Family Medicine

## 2015-10-01 ENCOUNTER — Emergency Department (HOSPITAL_COMMUNITY): Payer: Medicare HMO

## 2015-10-01 ENCOUNTER — Encounter (HOSPITAL_COMMUNITY): Payer: Self-pay | Admitting: Emergency Medicine

## 2015-10-01 VITALS — BP 124/68 | HR 105 | Temp 97.8°F | Wt 172.5 lb

## 2015-10-01 DIAGNOSIS — I272 Other secondary pulmonary hypertension: Secondary | ICD-10-CM | POA: Diagnosis not present

## 2015-10-01 DIAGNOSIS — Z87891 Personal history of nicotine dependence: Secondary | ICD-10-CM

## 2015-10-01 DIAGNOSIS — I5033 Acute on chronic diastolic (congestive) heart failure: Secondary | ICD-10-CM

## 2015-10-01 DIAGNOSIS — J449 Chronic obstructive pulmonary disease, unspecified: Secondary | ICD-10-CM | POA: Diagnosis present

## 2015-10-01 DIAGNOSIS — I110 Hypertensive heart disease with heart failure: Secondary | ICD-10-CM | POA: Diagnosis not present

## 2015-10-01 DIAGNOSIS — I2609 Other pulmonary embolism with acute cor pulmonale: Secondary | ICD-10-CM

## 2015-10-01 DIAGNOSIS — I9589 Other hypotension: Secondary | ICD-10-CM | POA: Diagnosis not present

## 2015-10-01 DIAGNOSIS — Z880 Allergy status to penicillin: Secondary | ICD-10-CM

## 2015-10-01 DIAGNOSIS — I959 Hypotension, unspecified: Secondary | ICD-10-CM | POA: Insufficient documentation

## 2015-10-01 DIAGNOSIS — J9690 Respiratory failure, unspecified, unspecified whether with hypoxia or hypercapnia: Secondary | ICD-10-CM

## 2015-10-01 DIAGNOSIS — E8770 Fluid overload, unspecified: Secondary | ICD-10-CM | POA: Diagnosis present

## 2015-10-01 DIAGNOSIS — E876 Hypokalemia: Secondary | ICD-10-CM | POA: Diagnosis not present

## 2015-10-01 DIAGNOSIS — J432 Centrilobular emphysema: Secondary | ICD-10-CM | POA: Diagnosis not present

## 2015-10-01 DIAGNOSIS — R609 Edema, unspecified: Secondary | ICD-10-CM

## 2015-10-01 DIAGNOSIS — Z79899 Other long term (current) drug therapy: Secondary | ICD-10-CM

## 2015-10-01 DIAGNOSIS — J9601 Acute respiratory failure with hypoxia: Secondary | ICD-10-CM | POA: Diagnosis not present

## 2015-10-01 DIAGNOSIS — Z888 Allergy status to other drugs, medicaments and biological substances status: Secondary | ICD-10-CM

## 2015-10-01 DIAGNOSIS — J439 Emphysema, unspecified: Secondary | ICD-10-CM | POA: Diagnosis not present

## 2015-10-01 DIAGNOSIS — I50810 Right heart failure, unspecified: Secondary | ICD-10-CM

## 2015-10-01 DIAGNOSIS — I519 Heart disease, unspecified: Secondary | ICD-10-CM

## 2015-10-01 DIAGNOSIS — R0602 Shortness of breath: Secondary | ICD-10-CM

## 2015-10-01 DIAGNOSIS — K7460 Unspecified cirrhosis of liver: Secondary | ICD-10-CM | POA: Diagnosis present

## 2015-10-01 DIAGNOSIS — I509 Heart failure, unspecified: Secondary | ICD-10-CM | POA: Diagnosis not present

## 2015-10-01 DIAGNOSIS — I2781 Cor pulmonale (chronic): Secondary | ICD-10-CM | POA: Diagnosis present

## 2015-10-01 DIAGNOSIS — I313 Pericardial effusion (noninflammatory): Secondary | ICD-10-CM | POA: Diagnosis present

## 2015-10-01 DIAGNOSIS — I5032 Chronic diastolic (congestive) heart failure: Secondary | ICD-10-CM

## 2015-10-01 DIAGNOSIS — M7989 Other specified soft tissue disorders: Secondary | ICD-10-CM

## 2015-10-01 DIAGNOSIS — J9621 Acute and chronic respiratory failure with hypoxia: Secondary | ICD-10-CM | POA: Diagnosis not present

## 2015-10-01 DIAGNOSIS — R600 Localized edema: Secondary | ICD-10-CM | POA: Diagnosis not present

## 2015-10-01 DIAGNOSIS — I10 Essential (primary) hypertension: Secondary | ICD-10-CM | POA: Diagnosis not present

## 2015-10-01 DIAGNOSIS — I3139 Other pericardial effusion (noninflammatory): Secondary | ICD-10-CM | POA: Diagnosis present

## 2015-10-01 DIAGNOSIS — Z825 Family history of asthma and other chronic lower respiratory diseases: Secondary | ICD-10-CM

## 2015-10-01 DIAGNOSIS — Z83511 Family history of glaucoma: Secondary | ICD-10-CM

## 2015-10-01 DIAGNOSIS — I119 Hypertensive heart disease without heart failure: Secondary | ICD-10-CM | POA: Diagnosis present

## 2015-10-01 DIAGNOSIS — D751 Secondary polycythemia: Secondary | ICD-10-CM | POA: Diagnosis present

## 2015-10-01 DIAGNOSIS — R0600 Dyspnea, unspecified: Secondary | ICD-10-CM

## 2015-10-01 HISTORY — DX: Right heart failure, unspecified: I50.810

## 2015-10-01 HISTORY — DX: Personal history of nicotine dependence: Z87.891

## 2015-10-01 HISTORY — DX: Other ill-defined heart diseases: I51.89

## 2015-10-01 HISTORY — DX: Chronic obstructive pulmonary disease, unspecified: J44.9

## 2015-10-01 HISTORY — DX: Pericardial effusion (noninflammatory): I31.3

## 2015-10-01 HISTORY — DX: Other pericardial effusion (noninflammatory): I31.39

## 2015-10-01 HISTORY — DX: Pulmonary hypertension, unspecified: I27.20

## 2015-10-01 HISTORY — DX: Localized edema: R60.0

## 2015-10-01 HISTORY — DX: Hypertensive heart disease without heart failure: I11.9

## 2015-10-01 LAB — BASIC METABOLIC PANEL
BUN: 17 mg/dL (ref 6–23)
CALCIUM: 9.1 mg/dL (ref 8.4–10.5)
CHLORIDE: 96 meq/L (ref 96–112)
CO2: 38 meq/L — AB (ref 19–32)
CREATININE: 1.04 mg/dL (ref 0.40–1.20)
GFR: 56.11 mL/min — ABNORMAL LOW (ref >60.00)
GLUCOSE: 113 mg/dL — AB (ref 70–99)
Potassium: 3 mEq/L — ABNORMAL LOW (ref 3.5–5.1)
Sodium: 142 mEq/L (ref 135–145)

## 2015-10-01 LAB — COMPREHENSIVE METABOLIC PANEL
ALT: 14 U/L (ref 14–54)
ANION GAP: 14 (ref 5–15)
AST: 26 U/L (ref 15–41)
Albumin: 3.7 g/dL (ref 3.5–5.0)
Alkaline Phosphatase: 85 U/L (ref 38–126)
BUN: 16 mg/dL (ref 6–20)
CHLORIDE: 97 mmol/L — AB (ref 101–111)
CO2: 32 mmol/L (ref 22–32)
CREATININE: 1.05 mg/dL — AB (ref 0.44–1.00)
Calcium: 9.1 mg/dL (ref 8.9–10.3)
GFR calc non Af Amer: 54 mL/min — ABNORMAL LOW (ref >60)
Glucose, Bld: 145 mg/dL — ABNORMAL HIGH (ref 65–99)
Potassium: 3.1 mmol/L — ABNORMAL LOW (ref 3.5–5.1)
SODIUM: 143 mmol/L (ref 135–145)
Total Bilirubin: 1.9 mg/dL — ABNORMAL HIGH (ref 0.3–1.2)
Total Protein: 6.3 g/dL — ABNORMAL LOW (ref 6.5–8.1)

## 2015-10-01 LAB — CBC
HCT: 52 % — ABNORMAL HIGH (ref 36.0–46.0)
HEMOGLOBIN: 16.7 g/dL — AB (ref 12.0–15.0)
MCH: 33.1 pg (ref 26.0–34.0)
MCHC: 32.1 g/dL (ref 30.0–36.0)
MCV: 103.2 fL — AB (ref 78.0–100.0)
Platelets: 188 10*3/uL (ref 150–400)
RBC: 5.04 MIL/uL (ref 3.87–5.11)
RDW: 16.8 % — ABNORMAL HIGH (ref 11.5–15.5)
WBC: 6 10*3/uL (ref 4.0–10.5)

## 2015-10-01 LAB — BRAIN NATRIURETIC PEPTIDE: Pro B Natriuretic peptide (BNP): 1159 pg/mL — ABNORMAL HIGH (ref 0.0–100.0)

## 2015-10-01 LAB — I-STAT TROPONIN, ED: Troponin i, poc: 0.02 ng/mL (ref 0.00–0.08)

## 2015-10-01 MED ORDER — FUROSEMIDE 10 MG/ML IJ SOLN
40.0000 mg | Freq: Once | INTRAMUSCULAR | Status: AC
  Administered 2015-10-01: 40 mg via INTRAVENOUS
  Filled 2015-10-01: qty 4

## 2015-10-01 MED ORDER — FUROSEMIDE 10 MG/ML IJ SOLN: 20.0000 mg | Freq: Once | INTRAMUSCULAR | Status: DC

## 2015-10-01 MED ORDER — POTASSIUM CHLORIDE CRYS ER 20 MEQ PO TBCR
40.0000 meq | EXTENDED_RELEASE_TABLET | Freq: Once | ORAL | Status: AC
  Administered 2015-10-01: 40 meq via ORAL
  Filled 2015-10-01: qty 2

## 2015-10-01 MED ORDER — NITROGLYCERIN 2 % TD OINT
1.0000 [in_us] | TOPICAL_OINTMENT | Freq: Once | TRANSDERMAL | Status: DC
  Filled 2015-10-01: qty 1

## 2015-10-01 NOTE — ED Notes (Signed)
Pt was seen at PCP today for peripheral edema and SOB. PCP was concerned that she had fluid around her heart. A&Ox4 and ambulatory. Pt's O2 sats are low 80's on room air.

## 2015-10-01 NOTE — Patient Instructions (Signed)
Great to see you. I will call you with your xray results and lab work. Try using the compression hose.

## 2015-10-01 NOTE — ED Provider Notes (Signed)
CSN: [id number]     Arrival date & time 10/01/15  1854 History   First MD Initiated Contact with Patient 10/01/15 1957     Chief Complaint  Patient presents with  . Shortness of Breath  . Leg Swelling     (Consider location/radiation/quality/duration/timing/severity/associated sxs/prior Treatment) HPI Complains of shortness of breath onset 2 days ago, gradually. Symptoms exacerbated by lying flat improved with sitting up.Evaluated bY Dr. Diona Browner and Deborra Medina in the office, BNP found to be elevated at 1159. Sent here for further evaluation. Patient also complains of leg swelling, bilateral progressively worsening 1 month. No other associated symptoms. No treatment prior to coming here Past Medical History  Diagnosis Date  . Allergic rhinitis due to pollen   . Hypertension   . H/O seasonal allergies   . Mild diastolic dysfunction 04/02/3663  . Acute respiratory failure with hypoxia and hypercapnea 02/26/2014   asthma Past Surgical History  Procedure Laterality Date  . Cholecystectomy      Open   Family History  Problem Relation Age of Onset  . Glaucoma Brother   . Vascular Disease Father     Died of complications from surgery  . Asthma Mother     Died of asthma attack  . Dementia Mother    Social History  Substance Use Topics  . Smoking status: Former Smoker -- 1.00 packs/day for 40 years    Types: Cigarettes    Quit date: 02/22/2014  . Smokeless tobacco: Never Used  . Alcohol Use: No   OB History    No data available     Review of Systems  Constitutional: Negative.   HENT: Negative.   Respiratory: Positive for shortness of breath.   Cardiovascular: Positive for leg swelling.  Gastrointestinal: Negative.   Musculoskeletal: Negative.   Skin: Negative.   Neurological: Negative.   Psychiatric/Behavioral: Negative.   All other systems reviewed and are negative.     Allergies  Amoxicillin-pot clavulanate; Cefdinir; Moxifloxacin; and Singulair  Home Medications    Prior to Admission medications   Medication Sig Start Date End Date Taking? Authorizing Provider  albuterol (PROVENTIL HFA;VENTOLIN HFA) 108 (90 BASE) MCG/ACT inhaler Inhale 1 puff into the lungs every 6 (six) hours as needed for wheezing. 11/06/14 11/06/15 Yes Lucille Passy, MD  fluticasone (FLONASE) 50 MCG/ACT nasal spray PLACE 2 SPRAYS INTO BOTH NOSTRILS DAILY 12/04/14  Yes Lucille Passy, MD  hydrochlorothiazide (MICROZIDE) 12.5 MG capsule TAKE ONE CAPSULE BY MOUTH DAILY Patient taking differently: TAKE 12.5 MG BY MOUTH DAILY 05/03/15  Yes Lucille Passy, MD  Potassium 99 MG TABS Take 99 mg by mouth daily.   Yes Historical Provider, MD  Fluticasone-Salmeterol (ADVAIR) 100-50 MCG/DOSE AEPB Inhale 1 puff into the lungs 2 (two) times daily. Patient not taking: Reported on 10/01/2015 07/10/15   Lucille Passy, MD   BP 123/72 mmHg  Pulse 102  Temp(Src) 98.4 F (36.9 C) (Oral)  Resp 16  SpO2 84% Physical Exam  Constitutional: She appears well-developed and well-nourished. No distress.  HENT:  Head: Normocephalic and atraumatic.  Eyes: Conjunctivae are normal. Pupils are equal, round, and reactive to light.  Neck: Neck supple. No tracheal deviation present. No thyromegaly present.  Cardiovascular: Normal rate and regular rhythm.   No murmur heard. Pulmonary/Chest: Effort normal. No respiratory distress.  Mild expiratory wheezes  Abdominal: Soft. Bowel sounds are normal. She exhibits no distension. There is no tenderness.  Musculoskeletal: Normal range of motion. She exhibits edema. She exhibits no tenderness.  2+  pretibial pitting edema bilaterally  Neurological: She is alert. Coordination normal.  Skin: Skin is warm and dry. No rash noted. She is not diaphoretic.  Psychiatric: She has a normal mood and affect.  Nursing note and vitals reviewed.   ED Course  Procedures (including critical care time) Labs Review Labs Reviewed  CBC - Abnormal; Notable for the following:    Hemoglobin 16.7  (*)    HCT 52.0 (*)    MCV 103.2 (*)    RDW 16.8 (*)    All other components within normal limits  COMPREHENSIVE METABOLIC PANEL  I-STAT TROPOININ, ED    Imaging Review Dg Chest 2 View  10/01/2015  CLINICAL DATA:  Worsening of lower extremity edema, shortness of breath; history of COPD and community-acquired pneumonia, former smoker. EXAM: CHEST  2 VIEW COMPARISON:  Chest x-ray of February 25, 2014 and CT scan of the chest of February 26, 2014. FINDINGS: The lungs are mildly hyperinflated. There is no focal infiltrate. There is no pleural effusion. The cardiac silhouette is enlarged. The pulmonary vascularity is prominent centrally but there is no cephalization of the vascular pattern. The mediastinum is normal in width. There is calcification in the wall of the aortic arch. There is multilevel degenerative disc disease of the thoracic spine. There is mild dextrocurvature centered in the mid thoracic spine which is not new. IMPRESSION: Stable cardiomegaly and central pulmonary vascular prominence superimposed upon COPD. There is no acute pneumonia nor pulmonary edema. Electronically Signed   By: David  Martinique M.D.   On: 10/01/2015 17:22   I have personally reviewed and evaluated these images and lab results as part of my medical decision-making.   EKG Interpretation   Date/Time:  Monday October 01 2015 19:13:08 EST Ventricular Rate:  97 PR Interval:  163 QRS Duration: 107 QT Interval:  375 QTC Calculation: 476 R Axis:   99 Text Interpretation:  Sinus rhythm Ventricular premature complex Biatrial  enlargement Low voltage, precordial leads Probable RVH w/ secondary repol  abnormality No significant change since last tracing Confirmed by  Winfred Leeds  MD, Kaliann Coryell [phone]) on 10/01/2015 7:18:40 PM     Chest x-ray viewed by me. Results for orders placed or performed during the hospital encounter of 10/01/15  Comprehensive metabolic panel  Result Value Ref Range   Sodium 143 135 - 145 mmol/L    Potassium 3.1 (L) 3.5 - 5.1 mmol/L   Chloride 97 (L) 101 - 111 mmol/L   CO2 32 22 - 32 mmol/L   Glucose, Bld 145 (H) 65 - 99 mg/dL   BUN 16 6 - 20 mg/dL   Creatinine, Ser 1.05 (H) 0.44 - 1.00 mg/dL   Calcium 9.1 8.9 - 10.3 mg/dL   Total Protein 6.3 (L) 6.5 - 8.1 g/dL   Albumin 3.7 3.5 - 5.0 g/dL   AST 26 15 - 41 U/L   ALT 14 14 - 54 U/L   Alkaline Phosphatase 85 38 - 126 U/L   Total Bilirubin 1.9 (H) 0.3 - 1.2 mg/dL   GFR calc non Af Amer 54 (L) >60 mL/min   GFR calc Af Amer >60 >60 mL/min   Anion gap 14 5 - 15  CBC  Result Value Ref Range   WBC 6.0 4.0 - 10.5 K/uL   RBC 5.04 3.87 - 5.11 MIL/uL   Hemoglobin 16.7 (H) 12.0 - 15.0 g/dL   HCT 52.0 (H) 36.0 - 46.0 %   MCV 103.2 (H) 78.0 - 100.0 fL   MCH 33.1 26.0 -  34.0 pg   MCHC 32.1 30.0 - 36.0 g/dL   RDW 16.8 (H) 11.5 - 15.5 %   Platelets 188 150 - 400 K/uL  I-stat troponin, ED (not at [facility], [facility])  Result Value Ref Range   Troponin i, poc 0.02 0.00 - 0.08 ng/mL   Comment 3           Dg Chest 2 View  10/01/2015  CLINICAL DATA:  Worsening of lower extremity edema, shortness of breath; history of COPD and community-acquired pneumonia, former smoker. EXAM: CHEST  2 VIEW COMPARISON:  Chest x-ray of February 25, 2014 and CT scan of the chest of February 26, 2014. FINDINGS: The lungs are mildly hyperinflated. There is no focal infiltrate. There is no pleural effusion. The cardiac silhouette is enlarged. The pulmonary vascularity is prominent centrally but there is no cephalization of the vascular pattern. The mediastinum is normal in width. There is calcification in the wall of the aortic arch. There is multilevel degenerative disc disease of the thoracic spine. There is mild dextrocurvature centered in the mid thoracic spine which is not new. IMPRESSION: Stable cardiomegaly and central pulmonary vascular prominence superimposed upon COPD. There is no acute pneumonia nor pulmonary edema. Electronically Signed   By: David  Martinique M.D.   On: 10/01/2015  17:22    MDM  Intravenous Lasix and topical nitrates initially ordered. Topical nitrates canceled as patient's blood pressure is borderline. She does have oxygen requirement. Clinically patient is in congestive heart failure supported by elevated BNP Dr. Olevia Bowens consulted and will see patient in the emergency department Plan diuresis, supplemental oxygen. Potassium supplementation With chronic leg edema patient likely has acute on chronic congestive heart failure Diagnosis #1 congestive heart failure #2 hypoxia #3 hypokalemia Final diagnoses:  None        Orlie Dakin, MD 10/01/15 2107

## 2015-10-01 NOTE — Progress Notes (Signed)
Subjective:   Patient ID: Linda Stevens, female    DOB: 07-14-1946, 70 y.o.   MRN: [id number]  Linda Stevens is a pleasant 70 y.o. year old female pt with h/o COPD, anxiety and HTN who presents to clinic today with Follow-up  on 10/01/2015  HPI:  Saw my partner, Dr. Diona Browner on 09/11/2015 for hypotension.  Note reviewed. At that [place], she also complained of bilateral pedal edema.   BP was 114/62 and Dr. Diona Browner noted bilateral 1+ pitting edema to mid calf on exam.  She advised to continue HCTZ as she was not symptomatically hypotensive and d/c of HCTZ would like worsen her edema. Felt edema likely secondary to increased salt intake and weight gain along with h/o venous insufficiency.  Labs were reassuring other than low potassium. Advised increased intake of potassium rich foods, compression hose, weight loss, and leg elevation.  2 D echo from 02/26/14- showed mild diastolic dysfunction.  Swelling has worsened.  She hasn't worn the compression hose- too painful.  Swelling is not improved in the mornings like it was. She is a little short of breath.  No CP.  Lab Results  Component Value Date   TSH 3.29 09/11/2015   Lab Results  Component Value Date   WBC 6.5 09/11/2015   HGB 16.9* 09/11/2015   HCT 51.8* 09/11/2015   MCV 100.4* 09/11/2015   PLT 173.0 09/11/2015   Lab Results  Component Value Date   NA 145 09/11/2015   K 2.9* 09/11/2015   CL 96 09/11/2015   CO2 32 09/11/2015   Lab Results  Component Value Date   CREATININE 1.02 09/11/2015   Lab Results  Component Value Date   ALT 11 09/11/2015   AST 19 09/11/2015   ALKPHOS 76 09/11/2015   BILITOT 1.2 09/11/2015   Current Outpatient Prescriptions on File Prior to Visit  Medication Sig Dispense Refill  . albuterol (PROVENTIL HFA;VENTOLIN HFA) 108 (90 BASE) MCG/ACT inhaler Inhale 1 puff into the lungs every 6 (six) hours as needed for wheezing. 1 Inhaler 11  . fluticasone (FLONASE) 50 MCG/ACT nasal spray PLACE 2  SPRAYS INTO BOTH NOSTRILS DAILY 16 g 5  . Fluticasone-Salmeterol (ADVAIR) 100-50 MCG/DOSE AEPB Inhale 1 puff into the lungs 2 (two) times daily. 1 each 3  . hydrochlorothiazide (MICROZIDE) 12.5 MG capsule TAKE ONE CAPSULE BY MOUTH DAILY 30 capsule 5   No current facility-administered medications on file prior to visit.    Allergies  Allergen Reactions  . Amoxicillin-Pot Clavulanate     REACTION: gi upset  . Cefdinir     REACTION: gi  upset  . Moxifloxacin     REACTION: rash  . Singulair [Montelukast Sodium]     Itching     Past Medical History  Diagnosis Date  . Allergic rhinitis due to pollen   . Hypertension   . H/O seasonal allergies   . Mild diastolic dysfunction 7/56/4332  . Acute respiratory failure with hypoxia and hypercapnea 02/26/2014    Past Surgical History  Procedure Laterality Date  . Cholecystectomy      Open    Family History  Problem Relation Age of Onset  . Glaucoma Brother   . Vascular Disease Father     Died of complications from surgery  . Asthma Mother     Died of asthma attack  . Dementia Mother     Social History   Social History  . Marital Status: Married    Spouse Name: N/A  . Number  of Children: N/A  . Years of Education: N/A   Occupational History  . Not on file.   Social History Main Topics  . Smoking status: Former Smoker -- 1.00 packs/day for 40 years    Types: Cigarettes    Quit date: 02/22/2014  . Smokeless tobacco: Never Used  . Alcohol Use: No  . Drug Use: No  . Sexual Activity: No   Other Topics Concern  . Not on file   Social History Narrative   Married with 2 children.  Independent of ADLs.      Does not have a living will.   Would desire CPR but would not want prolonged life support if futile- husband aware.   The PMH, PSH, Social History, Family History, Medications, and allergies have been reviewed in [facility], and have been updated if relevant.   Review of Systems  Constitutional: Positive for fatigue.  Negative for fever.  HENT: Negative.   Respiratory: Positive for shortness of breath. Negative for cough, wheezing and stridor.   Cardiovascular: Positive for leg swelling. Negative for chest pain and palpitations.  Neurological: Negative.        Objective:    BP 124/68 mmHg  Pulse 105  Temp(Src) 97.8 F (36.6 C) (Oral)  Wt 172 lb 8 oz (78.245 kg)  SpO2 85% Wt Readings from Last 3 Encounters:  10/01/15 172 lb 8 oz (78.245 kg)  09/11/15 167 lb 4 oz (75.864 kg)  07/10/15 161 lb (73.029 kg)     Physical Exam  Constitutional: She is oriented to person, place, and time. She appears well-developed and well-nourished. No distress.  HENT:  Head: Normocephalic.  Eyes: Conjunctivae are normal.  Cardiovascular: Normal rate.   Pulmonary/Chest: Effort normal. She has no wheezes. She has no rales.  Musculoskeletal: She exhibits edema.  2+ pitting edema bilaterally  Neurological: She is alert and oriented to person, place, and time. No cranial nerve deficit.  Skin: Skin is warm and dry. She is not diaphoretic.  Psychiatric: She has a normal mood and affect. Her behavior is normal. Judgment and thought content normal.  Nursing note and vitals reviewed.         Assessment & Plan:   Peripheral edema  Mild diastolic dysfunction  Other specified hypotension No Follow-up on file.

## 2015-10-01 NOTE — H&P (Signed)
[facility] History and Physical  Linda Stevens:[id number] DOB: December 08, 1945 DOA: 10/01/2015  Referring physician: Orlie Dakin, MD PCP: Arnette Norris, MD   Chief Complaint: Shortness of breath and less swelling.  HPI: Linda Stevens is a 70 y.o. female with a past medical history hypertension, seasonal allergies, COPD, CHF due to mild diastolic dysfunction who comes to the emergency department after being referred by her PCP due to progressively worse dyspnea, abdominal and lower extremity edema. Patient's initial O2 sats were in the low 80s on room air.  Per patient, in the last few weeks, she has noticed lower extremity edema which has been progressively getting worse. She was evaluated at her PCPs office by another provider about 2 weeks ago and follow-up today with her primary care doctor, Arnette Norris, M.D., who referred the patient to the ER since she has been having severe shortness of breath in the past 2 days associated with lower extremity edema, orthopnea and PND. The patient has been sleeping on a recliner and has been using at least 2 pillows to sleep, but states that last night she got up from bed and went back to the recliner to sleep due to shortness of breath.  When seen in the emergency department, the patient stated that she felt much better after IV furosemide has been given. She feels that her lower extremities are not as tight as earlier and her shortness of breath is much better. Workup shows hypokalemia, polycythemia and an elevated BNP of 1159.   Review of Systems:  Constitutional:  Positive fatigue.  No weight loss, night sweats, Fevers, chills. HEENT:  No headaches, Difficulty swallowing,Tooth/dental problems,Sore throat,  No sneezing, itching, ear ache, nasal congestion, post nasal drip,  Cardio-vascular:  Positive orthopnea, PND, swelling in lower extremities, anasarca, dizziness, palpitations  No chest pain.  GI:  No heartburn, indigestion,  abdominal pain, nausea, vomiting, diarrhea, change in bowel habits, loss of appetite  Resp:  Positive dyspnea, occasionally productive cough of whitish sputum, mild wheezing, no hemoptysis. Skin:  no rash or lesions.  GU:  no dysuria, change in color of urine, no urgency or frequency. No flank pain.  Musculoskeletal:  Edema in the use lower extremities decreased range of motion. No joint pain or swelling. No back pain.  Psych:  No change in mood or affect. No depression or anxiety. No memory loss.   Past Medical History  Diagnosis Date  . Allergic rhinitis due to pollen   . Hypertension   . H/O seasonal allergies   . Mild diastolic dysfunction 6/33/3545  . Acute respiratory failure with hypoxia and hypercapnea 02/26/2014   Past Surgical History  Procedure Laterality Date  . Cholecystectomy      Open   Social History:  reports that she quit smoking about 19 months ago. Her smoking use included Cigarettes. She has a 40 pack-year smoking history. She has never used smokeless tobacco. She reports that she does not drink alcohol or use illicit drugs.  Allergies  Allergen Reactions  . Amoxicillin-Pot Clavulanate     REACTION: gi upset Has patient had a PCN reaction causing immediate rash, facial/tongue/throat swelling, SOB or lightheadedness with hypotension: No Has patient had a PCN reaction causing severe rash involving mucus membranes or skin necrosis: No Has patient had a PCN reaction that required hospitalization : No Has patient had a PCN reaction occurring within the last 10 years: No If all of the above answers are "NO", then may proceed with Cephalosporin use.   [name]  Cefdinir     REACTION: gi  upset  . Moxifloxacin     REACTION: rash  . Singulair [Montelukast Sodium]     Itching     Family History  Problem Relation Age of Onset  . Glaucoma Brother   . Vascular Disease Father     Died of complications from surgery  . Asthma Mother     Died of asthma attack  .  Dementia Mother     Prior to Admission medications   Medication Sig Start Date End Date Taking? Authorizing Provider  albuterol (PROVENTIL HFA;VENTOLIN HFA) 108 (90 BASE) MCG/ACT inhaler Inhale 1 puff into the lungs every 6 (six) hours as needed for wheezing. 11/06/14 11/06/15 Yes Lucille Passy, MD  fluticasone (FLONASE) 50 MCG/ACT nasal spray PLACE 2 SPRAYS INTO BOTH NOSTRILS DAILY 12/04/14  Yes Lucille Passy, MD  hydrochlorothiazide (MICROZIDE) 12.5 MG capsule TAKE ONE CAPSULE BY MOUTH DAILY Patient taking differently: TAKE 12.5 MG BY MOUTH DAILY 05/03/15  Yes Lucille Passy, MD  Potassium 99 MG TABS Take 99 mg by mouth daily.   Yes Historical Provider, MD  Fluticasone-Salmeterol (ADVAIR) 100-50 MCG/DOSE AEPB Inhale 1 puff into the lungs 2 (two) times daily. Patient not taking: Reported on 10/01/2015 07/10/15   Lucille Passy, MD   Physical Exam: Filed Vitals:   10/01/15 2046 10/01/15 2047 10/01/15 2211 10/01/15 2255  BP: 111/74 111/74 103/74 103/70  Pulse: 88 88 94 96  Temp:      TempSrc:      Resp: _0 SpO2: 97% 97% 93% 93%    Wt Readings from Last 3 Encounters:  10/01/15 78.245 kg (172 lb 8 oz)  09/11/15 75.864 kg (167 lb 4 oz)  07/10/15 73.029 kg (161 lb)    General:  Appears calm and comfortable Eyes: PERRL, normal lids, irises & conjunctiva ENT: grossly normal hearing, lips & tongue Neck: no LAD, masses or thyromegaly Cardiovascular: RRR, no m/r/g. 2+ LE edema. Telemetry: SR, occasional PVCs.  Respiratory: Bibasilar Rales. No wheezing, no rhonchi. No accessory muscle use. Abdomen: soft, ntnd Skin: no rash or induration seen on limited exam Musculoskeletal: grossly normal tone BUE/BLE Psychiatric: grossly normal mood and affect, speech fluent and appropriate Neurologic: grossly non-focal.          Labs on Admission:  Basic Metabolic Panel:  Recent Labs Lab 10/01/15 1531 10/01/15 1925  NA 142 143  K 3.0* 3.1*  CL 96 97*  CO2 38* 32  GLUCOSE 113* 145*  BUN  17 16  CREATININE 1.04 1.05*  CALCIUM 9.1 9.1   Liver Function Tests:  Recent Labs Lab 10/01/15 1925  AST 26  ALT 14  ALKPHOS 85  BILITOT 1.9*  PROT 6.3*  ALBUMIN 3.7   CBC:  Recent Labs Lab 10/01/15 1925  WBC 6.0  HGB 16.7*  HCT 52.0*  MCV 103.2*  PLT 188    ProBNP (last 3 results)  Recent Labs  10/01/15 1531  PROBNP 1159.0*     Radiological Exams on Admission: Dg Chest 2 View  10/01/2015  CLINICAL DATA:  Worsening of lower extremity edema, shortness of breath; history of COPD and community-acquired pneumonia, former smoker. EXAM: CHEST  2 VIEW COMPARISON:  Chest x-ray of February 25, 2014 and CT scan of the chest of February 26, 2014. FINDINGS: The lungs are mildly hyperinflated. There is no focal infiltrate. There is no pleural effusion. The cardiac silhouette is enlarged. The pulmonary vascularity is prominent centrally but there is no  cephalization of the vascular pattern. The mediastinum is normal in width. There is calcification in the wall of the aortic arch. There is multilevel degenerative disc disease of the thoracic spine. There is mild dextrocurvature centered in the mid thoracic spine which is not new. IMPRESSION: Stable cardiomegaly and central pulmonary vascular prominence superimposed upon COPD. There is no acute pneumonia nor pulmonary edema. Electronically Signed   By: David  Martinique M.D.   On: 10/01/2015 17:22    EKG: Independently reviewed. Vent. rate 97 BPM PR interval 163 ms QRS duration 107 ms QT/QTc 375/476 ms P-R-T axes 83 99 -87 Sinus rhythm Ventricular premature complex Biatrial enlargement Low voltage, precordial leads Probable RVH w/ secondary repol abnormality No significant change since last EKG.  Assessment/Plan Principal Problem:   Diastolic CHF exacerbation (HCC) Admit to telemetry. Follow-up troponin levels. Check echocardiogram in the morning. Low-sodium/heart healthy diet. Fluid restriction to 1.2 L a day. Start low-dose ACE  inhibitor. Continue IV furosemide and monitor BUN/creatinine and electrolytes.  Active Problems:   HTN (hypertension) Continue diuretics. Start low-dose lisinopril 2.5 mg by mouth daily. Monitor blood pressure. Monitor renal function and electrolytes.    Hypokalemia Continue oral replacement. Monitor potassium level. Check magnesium level and magnesium sulfate as needed.    COPD II  Order bronchodilators as needed.    Polycythemia Follow-up hematocrit and hemoglobin in a.m. Lovenox for DVT prophylaxis.    Code Status: Full code. DVT Prophylaxis: Lovenox SQ. Family Communication: Her husband and daughters were present in the room. Disposition Plan: Admit for further evaluation and treatment.  Time spent: Over 70 minutes were used in the process admission.  Reubin Milan [facility] Pager [phone].

## 2015-10-01 NOTE — Assessment & Plan Note (Addendum)
Deteriorated with lower O2 sats although pt insists O2 sats above 90 at home. BNP, CXR and d dimer now. Repeat 2 D echo. Wear compression hose. May need higher dose of diuretic but will need to use cautiously due to h/o hypotension. The patient indicates understanding of these issues and agrees with the plan.

## 2015-10-01 NOTE — Progress Notes (Signed)
Pre visit review using our clinic review tool, if applicable. No additional management support is needed unless otherwise documented below in the visit note.

## 2015-10-02 ENCOUNTER — Encounter (HOSPITAL_COMMUNITY): Payer: Self-pay

## 2015-10-02 ENCOUNTER — Observation Stay (HOSPITAL_COMMUNITY): Payer: Medicare HMO

## 2015-10-02 ENCOUNTER — Telehealth: Payer: Self-pay | Admitting: Radiology

## 2015-10-02 DIAGNOSIS — J9601 Acute respiratory failure with hypoxia: Secondary | ICD-10-CM | POA: Diagnosis not present

## 2015-10-02 DIAGNOSIS — I509 Heart failure, unspecified: Secondary | ICD-10-CM

## 2015-10-02 DIAGNOSIS — I5033 Acute on chronic diastolic (congestive) heart failure: Secondary | ICD-10-CM | POA: Diagnosis not present

## 2015-10-02 DIAGNOSIS — Z79899 Other long term (current) drug therapy: Secondary | ICD-10-CM | POA: Diagnosis not present

## 2015-10-02 DIAGNOSIS — J9690 Respiratory failure, unspecified, unspecified whether with hypoxia or hypercapnia: Secondary | ICD-10-CM | POA: Diagnosis not present

## 2015-10-02 DIAGNOSIS — I2609 Other pulmonary embolism with acute cor pulmonale: Secondary | ICD-10-CM | POA: Diagnosis not present

## 2015-10-02 DIAGNOSIS — K7460 Unspecified cirrhosis of liver: Secondary | ICD-10-CM | POA: Diagnosis not present

## 2015-10-02 DIAGNOSIS — R0602 Shortness of breath: Secondary | ICD-10-CM | POA: Diagnosis not present

## 2015-10-02 DIAGNOSIS — E876 Hypokalemia: Secondary | ICD-10-CM | POA: Diagnosis not present

## 2015-10-02 DIAGNOSIS — Z87891 Personal history of nicotine dependence: Secondary | ICD-10-CM | POA: Diagnosis not present

## 2015-10-02 DIAGNOSIS — Z83511 Family history of glaucoma: Secondary | ICD-10-CM | POA: Diagnosis not present

## 2015-10-02 DIAGNOSIS — I10 Essential (primary) hypertension: Secondary | ICD-10-CM | POA: Diagnosis not present

## 2015-10-02 DIAGNOSIS — I959 Hypotension, unspecified: Secondary | ICD-10-CM | POA: Diagnosis not present

## 2015-10-02 DIAGNOSIS — I2781 Cor pulmonale (chronic): Secondary | ICD-10-CM | POA: Diagnosis not present

## 2015-10-02 DIAGNOSIS — D751 Secondary polycythemia: Secondary | ICD-10-CM | POA: Diagnosis not present

## 2015-10-02 DIAGNOSIS — R062 Wheezing: Secondary | ICD-10-CM | POA: Diagnosis not present

## 2015-10-02 DIAGNOSIS — J45909 Unspecified asthma, uncomplicated: Secondary | ICD-10-CM | POA: Diagnosis not present

## 2015-10-02 DIAGNOSIS — I110 Hypertensive heart disease with heart failure: Secondary | ICD-10-CM | POA: Diagnosis not present

## 2015-10-02 DIAGNOSIS — M7989 Other specified soft tissue disorders: Secondary | ICD-10-CM | POA: Diagnosis not present

## 2015-10-02 DIAGNOSIS — J449 Chronic obstructive pulmonary disease, unspecified: Secondary | ICD-10-CM | POA: Diagnosis not present

## 2015-10-02 DIAGNOSIS — Z825 Family history of asthma and other chronic lower respiratory diseases: Secondary | ICD-10-CM | POA: Diagnosis not present

## 2015-10-02 DIAGNOSIS — I313 Pericardial effusion (noninflammatory): Secondary | ICD-10-CM | POA: Diagnosis not present

## 2015-10-02 DIAGNOSIS — J439 Emphysema, unspecified: Secondary | ICD-10-CM | POA: Diagnosis not present

## 2015-10-02 DIAGNOSIS — I319 Disease of pericardium, unspecified: Secondary | ICD-10-CM | POA: Diagnosis not present

## 2015-10-02 DIAGNOSIS — J188 Other pneumonia, unspecified organism: Secondary | ICD-10-CM | POA: Diagnosis not present

## 2015-10-02 DIAGNOSIS — R05 Cough: Secondary | ICD-10-CM | POA: Diagnosis not present

## 2015-10-02 DIAGNOSIS — J90 Pleural effusion, not elsewhere classified: Secondary | ICD-10-CM | POA: Diagnosis not present

## 2015-10-02 DIAGNOSIS — J9621 Acute and chronic respiratory failure with hypoxia: Secondary | ICD-10-CM | POA: Diagnosis not present

## 2015-10-02 DIAGNOSIS — I272 Other secondary pulmonary hypertension: Secondary | ICD-10-CM | POA: Diagnosis not present

## 2015-10-02 DIAGNOSIS — Z888 Allergy status to other drugs, medicaments and biological substances status: Secondary | ICD-10-CM | POA: Diagnosis not present

## 2015-10-02 DIAGNOSIS — J45901 Unspecified asthma with (acute) exacerbation: Secondary | ICD-10-CM | POA: Diagnosis not present

## 2015-10-02 DIAGNOSIS — E8770 Fluid overload, unspecified: Secondary | ICD-10-CM | POA: Diagnosis not present

## 2015-10-02 DIAGNOSIS — R600 Localized edema: Secondary | ICD-10-CM | POA: Diagnosis not present

## 2015-10-02 DIAGNOSIS — J432 Centrilobular emphysema: Secondary | ICD-10-CM | POA: Diagnosis not present

## 2015-10-02 DIAGNOSIS — Z880 Allergy status to penicillin: Secondary | ICD-10-CM | POA: Diagnosis not present

## 2015-10-02 LAB — BASIC METABOLIC PANEL
Anion gap: 14 (ref 5–15)
BUN: 16 mg/dL (ref 6–20)
CHLORIDE: 98 mmol/L — AB (ref 101–111)
CO2: 34 mmol/L — AB (ref 22–32)
CREATININE: 1.13 mg/dL — AB (ref 0.44–1.00)
Calcium: 9.4 mg/dL (ref 8.9–10.3)
GFR calc non Af Amer: 49 mL/min — ABNORMAL LOW (ref >60)
GFR, EST AFRICAN AMERICAN: 57 mL/min — AB (ref >60)
Glucose, Bld: 94 mg/dL (ref 65–99)
POTASSIUM: 3.5 mmol/L (ref 3.5–5.1)
SODIUM: 146 mmol/L — AB (ref 135–145)

## 2015-10-02 LAB — MAGNESIUM: Magnesium: 1.9 mg/dL (ref 1.7–2.4)

## 2015-10-02 LAB — TROPONIN I

## 2015-10-02 LAB — PHOSPHORUS: Phosphorus: 4.3 mg/dL (ref 2.5–4.6)

## 2015-10-02 LAB — D-DIMER, QUANTITATIVE: D-Dimer, Quant: 3.7 ug/mL-FEU — ABNORMAL HIGH (ref 0.00–0.48)

## 2015-10-02 MED ORDER — FLUTICASONE PROPIONATE 50 MCG/ACT NA SUSP
1.0000 | Freq: Every day | NASAL | Status: DC
Start: 2015-10-02 — End: 2015-10-09
  Administered 2015-10-02 – 2015-10-08 (×6): 1 via NASAL
  Filled 2015-10-02 (×2): qty 16

## 2015-10-02 MED ORDER — ZOLPIDEM TARTRATE 5 MG PO TABS
5.0000 mg | ORAL_TABLET | Freq: Once | ORAL | Status: AC
  Administered 2015-10-02: 5 mg via ORAL
  Filled 2015-10-02: qty 1

## 2015-10-02 MED ORDER — ASPIRIN EC 81 MG PO TBEC
81.0000 mg | DELAYED_RELEASE_TABLET | Freq: Every day | ORAL | Status: DC
  Administered 2015-10-02 – 2015-10-09 (×8): 81 mg via ORAL
  Filled 2015-10-02 (×9): qty 1

## 2015-10-02 MED ORDER — LISINOPRIL 2.5 MG PO TABS
2.5000 mg | ORAL_TABLET | Freq: Every day | ORAL | Status: DC
  Administered 2015-10-02: 2.5 mg via ORAL
  Filled 2015-10-02 (×2): qty 1

## 2015-10-02 MED ORDER — SODIUM CHLORIDE 0.9 % IV BOLUS (SEPSIS)
250.0000 mL | Freq: Once | INTRAVENOUS | Status: AC
Start: 2015-10-02 — End: 2015-10-02
  Administered 2015-10-02: 250 mL via INTRAVENOUS

## 2015-10-02 MED ORDER — ENOXAPARIN SODIUM 40 MG/0.4ML ~~LOC~~ SOLN
40.0000 mg | SUBCUTANEOUS | Status: DC
  Administered 2015-10-03 – 2015-10-07 (×5): 40 mg via SUBCUTANEOUS
  Filled 2015-10-02 (×5): qty 0.4

## 2015-10-02 MED ORDER — POTASSIUM CHLORIDE CRYS ER 20 MEQ PO TBCR
40.0000 meq | EXTENDED_RELEASE_TABLET | Freq: Two times a day (BID) | ORAL | Status: DC
  Administered 2015-10-02 – 2015-10-07 (×11): 40 meq via ORAL
  Filled 2015-10-02 (×11): qty 2

## 2015-10-02 MED ORDER — ALBUTEROL SULFATE (2.5 MG/3ML) 0.083% IN NEBU: 2.5000 mg | INHALATION_SOLUTION | Freq: Four times a day (QID) | RESPIRATORY_TRACT | Status: DC | PRN

## 2015-10-02 MED ORDER — SODIUM CHLORIDE 0.9 % IJ SOLN
3.0000 mL | Freq: Two times a day (BID) | INTRAMUSCULAR | Status: DC
  Administered 2015-10-02 – 2015-10-09 (×12): 3 mL via INTRAVENOUS

## 2015-10-02 MED ORDER — FUROSEMIDE 10 MG/ML IJ SOLN
40.0000 mg | Freq: Two times a day (BID) | INTRAMUSCULAR | Status: DC
  Administered 2015-10-02: 40 mg via INTRAVENOUS
  Filled 2015-10-02 (×2): qty 4

## 2015-10-02 MED ORDER — MAGNESIUM SULFATE 2 GM/50ML IV SOLN
2.0000 g | Freq: Once | INTRAVENOUS | Status: AC
  Administered 2015-10-02: 2 g via INTRAVENOUS
  Filled 2015-10-02: qty 50

## 2015-10-02 NOTE — Progress Notes (Signed)
[facility] PROGRESS NOTE  Linda Stevens RDE:[id number] DOB: October 27, 1945 DOA: 10/01/2015 PCP: Arnette Norris, MD  Brief narrative: 70 year old presenting with CHF exacerbation. Awaiting ultrasound results to characterize. Had soft blood pressures as such Lasix held today and will hold lisinopril.  Assessment/Plan: Principal Problem:   CHF exacerbation ([place]) - Patient placed on IV Lasix 40 mg twice a day. Nursing reporting patient has some soft low blood pressures. As such will hold Lasix and administer 250 mL fluid bolus. - We'll continue to monitor blood pressures  Hypotension - Please see above. Will hold lisinopril  Active Problems:   Hypokalemia - Resolved    COPD II  - No wheezes compensated currently continue home medication regimen - albuterol    Polycythemia - stable  Code Status: full Family Communication: Discussed directly with patient Disposition Plan: Pending improvement in breathing condition and test results   Consultants:  None  Procedures:  Echocardiogram  Antibiotics:  None  HPI/Subjective: Patient had many questions which were ensured to her satisfaction. No new complaints reported. Nursing page after my visit indicating the patient had soft blood pressures. Of note patient was sitting up talking without any difficulty or distress. Was speaking in full sentences off supplemental oxygen and smiling at times  Objective: Filed Vitals:   10/02/15 1035 10/02/15 1333  BP: 120/54 86/46  Pulse:  84  Temp:  98 F (36.7 C)  Resp:  15    Intake/Output Summary (Last 24 hours) at 10/02/15 1427 Last data filed at 10/02/15 1327  Gross per 24 hour  Intake    480 ml  Output   1000 ml  Net   -520 ml   Filed Weights   10/02/15 0248  Weight: 75.66 kg (166 lb 12.8 oz)    Exam:   General:  Patient in no acute distress, alert and awake  Cardiovascular: Regular rate and rhythm, no rubs  Respiratory: No increased work of breathing, no  wheezes equal chest rise breathing comfortably on room air  Abdomen: Soft, nondistended, nontender  Musculoskeletal: No cyanosis or clubbing   Data Reviewed: Basic Metabolic Panel:  Recent Labs Lab 10/01/15 1531 10/01/15 1925 10/02/15 0009 10/02/15 0740  NA 142 143  --  146*  K 3.0* 3.1*  --  3.5  CL 96 97*  --  98*  CO2 38* 32  --  34*  GLUCOSE 113* 145*  --  94  BUN 17 16  --  16  CREATININE 1.04 1.05*  --  1.13*  CALCIUM 9.1 9.1  --  9.4  MG  --   --  1.9  --   PHOS  --   --  4.3  --    Liver Function Tests:  Recent Labs Lab 10/01/15 1925  AST 26  ALT 14  ALKPHOS 85  BILITOT 1.9*  PROT 6.3*  ALBUMIN 3.7   No results for input(s): LIPASE, AMYLASE in the last 168 hours. No results for input(s): AMMONIA in the last 168 hours. CBC:  Recent Labs Lab 10/01/15 1925  WBC 6.0  HGB 16.7*  HCT 52.0*  MCV 103.2*  PLT 188   Cardiac Enzymes:  Recent Labs Lab 10/02/15 0132 10/02/15 0740  TROPONINI <0.03 <0.03   BNP (last 3 results) No results for input(s): BNP in the last 8760 hours.  ProBNP (last 3 results)  Recent Labs  10/01/15 1531  PROBNP 1159.0*    CBG: No results for input(s): GLUCAP in the last 168 hours.  No results found for  this or any previous visit (from the past 240 hour(s)).   Studies: Dg Chest 2 View  10/01/2015  CLINICAL DATA:  Worsening of lower extremity edema, shortness of breath; history of COPD and community-acquired pneumonia, former smoker. EXAM: CHEST  2 VIEW COMPARISON:  Chest x-ray of February 25, 2014 and CT scan of the chest of February 26, 2014. FINDINGS: The lungs are mildly hyperinflated. There is no focal infiltrate. There is no pleural effusion. The cardiac silhouette is enlarged. The pulmonary vascularity is prominent centrally but there is no cephalization of the vascular pattern. The mediastinum is normal in width. There is calcification in the wall of the aortic arch. There is multilevel degenerative disc disease of the  thoracic spine. There is mild dextrocurvature centered in the mid thoracic spine which is not new. IMPRESSION: Stable cardiomegaly and central pulmonary vascular prominence superimposed upon COPD. There is no acute pneumonia nor pulmonary edema. Electronically Signed   By: David  Martinique M.D.   On: 10/01/2015 17:22    Scheduled Meds: . aspirin EC  81 mg Oral Daily  . [START ON 10/03/2015] enoxaparin (LOVENOX) injection  40 mg Subcutaneous Q24H  . fluticasone  1 spray Each Nare Daily  . furosemide  40 mg Intravenous BID AC  . lisinopril  2.5 mg Oral Daily  . nitroGLYCERIN  1 inch Topical Once  . potassium chloride  40 mEq Oral BID  . sodium chloride  3 mL Intravenous Q12H   Continuous Infusions:   Time spent: > 35 minutes   Velvet Bathe  [facility] Pager [phone]. If 7PM-7AM, please contact night-coverage at [email or web address], password [facility] 10/02/2015, 2:27 PM

## 2015-10-02 NOTE — Progress Notes (Signed)
  Echocardiogram 2D Echocardiogram has been performed.  Darlina Sicilian M 10/02/2015, 3:41 PM

## 2015-10-02 NOTE — Telephone Encounter (Signed)
Pt current in the hospital.

## 2015-10-02 NOTE — Telephone Encounter (Signed)
[facility] lab called a critical D-dimer result, 3.70. Results given to Dr Deborra Medina

## 2015-10-02 NOTE — Progress Notes (Signed)
Pt's BP 86/46 Pulse 84. Pt alert and oriented and no other acute changes noted in Pt's assessment. MD updated via phone and new orders received. Will monitor Pt closely.

## 2015-10-03 DIAGNOSIS — D751 Secondary polycythemia: Secondary | ICD-10-CM

## 2015-10-03 DIAGNOSIS — I10 Essential (primary) hypertension: Secondary | ICD-10-CM

## 2015-10-03 DIAGNOSIS — E876 Hypokalemia: Secondary | ICD-10-CM

## 2015-10-03 DIAGNOSIS — I5033 Acute on chronic diastolic (congestive) heart failure: Secondary | ICD-10-CM

## 2015-10-03 DIAGNOSIS — J449 Chronic obstructive pulmonary disease, unspecified: Secondary | ICD-10-CM

## 2015-10-03 LAB — CBC
HCT: 48.4 % — ABNORMAL HIGH (ref 36.0–46.0)
HEMOGLOBIN: 15 g/dL (ref 12.0–15.0)
MCH: 32.7 pg (ref 26.0–34.0)
MCHC: 31 g/dL (ref 30.0–36.0)
MCV: 105.4 fL — ABNORMAL HIGH (ref 78.0–100.0)
PLATELETS: 193 10*3/uL (ref 150–400)
RBC: 4.59 MIL/uL (ref 3.87–5.11)
RDW: 17.1 % — ABNORMAL HIGH (ref 11.5–15.5)
WBC: 4.9 10*3/uL (ref 4.0–10.5)

## 2015-10-03 LAB — BASIC METABOLIC PANEL
ANION GAP: 12 (ref 5–15)
BUN: 20 mg/dL (ref 6–20)
CALCIUM: 8.7 mg/dL — AB (ref 8.9–10.3)
CO2: 33 mmol/L — AB (ref 22–32)
CREATININE: 1.12 mg/dL — AB (ref 0.44–1.00)
Chloride: 99 mmol/L — ABNORMAL LOW (ref 101–111)
GFR, EST AFRICAN AMERICAN: 58 mL/min — AB (ref >60)
GFR, EST NON AFRICAN AMERICAN: 50 mL/min — AB (ref >60)
GLUCOSE: 102 mg/dL — AB (ref 65–99)
Potassium: 3.6 mmol/L (ref 3.5–5.1)
Sodium: 144 mmol/L (ref 135–145)

## 2015-10-03 MED ORDER — SODIUM CHLORIDE 0.9 % IV BOLUS (SEPSIS)
250.0000 mL | Freq: Once | INTRAVENOUS | Status: AC
  Administered 2015-10-03: 250 mL via INTRAVENOUS

## 2015-10-03 MED ORDER — ZOLPIDEM TARTRATE 5 MG PO TABS
5.0000 mg | ORAL_TABLET | Freq: Every evening | ORAL | Status: DC | PRN
  Administered 2015-10-03: 5 mg via ORAL
  Filled 2015-10-03: qty 1

## 2015-10-03 NOTE — Progress Notes (Signed)
SATURATION QUALIFICATIONS: (This note is used to comply with regulatory documentation for home oxygen)  Patient Saturations on Room Air at Rest = 87%  Patient Saturations on Room Air while Ambulating = 81-88%

## 2015-10-03 NOTE — Clinical Documentation Improvement (Addendum)
Hospitalist  (Query responses must be documented in the current medical record, not on the CDI BPA form.)  Possible Clinical Conditions:  - Acute Hypoxic Respiratory Failure, present on admission, with an O2 sat of 84% on room air  - Other condition  - Unable to clinically determine  Clinical Information/Indicators: "Patient's initial O2 sats were in the low 80s on room air" is documented by Dr. Reubin Milan in the H&P Room Air sat 84% in ED Respiratory rate in the mid 20's Known history of COPD II per H&P  Please exercise your independent, professional judgment when responding. A specific answer is not anticipated or expected.   Thank You, Erling Conte  RN BSN [facility]S [phone] Health Information Management [place]

## 2015-10-03 NOTE — Consult Note (Signed)
Name: JARIA CONWAY MRN: [id number] DOB: 06/29/1946    ADMISSION DATE:  10/01/2015 CONSULTATION DATE:  1/25  REFERRING MD :  Wendee Beavers  CHIEF COMPLAINT:  On-going hypoxia in spite of diuresis   BRIEF PATIENT DESCRIPTION: 70 year old presenting to the emergency department on 1/23 with lower extremity edema which has been progressively worsening. Patient has significant medical history of HTN, copd, asthma. It was presumed patient had acute chf exacerbation and was diuresised. Echocardiogram resulted with a grade 1 DD. Despite diuresis has had continued hypoxia requiring supplemental oxygen (2.5 liters via [place]). Diuretics were discontinued on 1/24 due to soft blood pressures and patient was given a 250 ml bolus of NS.    SIGNIFICANT EVENTS    STUDIES:  - Echocardiogram 1/24: Severely dilated RV with moderately decreased systolic function. D-shaped interventricular septum suggesting RV pressure/volume overload. The LV appears small and compressed by the large RV. Normal LV size and systolic function, EF 38%. There was severe pulmonary hypertension. There was a moderate pericardial effusion with no definitive signs of pericardial tamponade. - Chest X-Ray 1/25: Mildly hyperinflated lungs, calcification in the wall of aortic arch, no focal infiltrate/pleural effusion, enlarged cardiac silhouette, stable cardiomegaly and central pulmonary vascular prominence superimposed upon COPD. No acute pneumonia or pulmonary edema.    HISTORY OF PRESENT ILLNESS:   70 year old female went to primary care physician 3 weeks ago complaining of hypotension and lower extremity edema. Patient states that baseline weight is 155 lbs. presented to Emergency Department weighing 172 lbs, has been diuresised and now weighs 166 lbs. Patient states that she is a 55 year smoker and recently quit last July. On this admission was feeling shortness of breath with mobility, today denies shortness of breath however, with mobility  on room air pulse oxygenation is low 80s. Echocardiogram on 1/24 showed severely dilated RV with moderately decreased systolic function and D-shaped interventricular septum suggesting RV pressure/volume overload. Chest X-ray 1/25 showed mildly hyperinflated lungs with an enlarged cardiac silhouette, with no acute findings. PCCM asked to consult on 1/25 for continued hypoxia.   PAST MEDICAL HISTORY :   has a past medical history of Allergic rhinitis due to pollen; Hypertension; H/O seasonal allergies; Mild diastolic dysfunction (7/56/4332); and Acute respiratory failure with hypoxia and hypercapnea (02/26/2014).  has past surgical history that includes Cholecystectomy. Prior to Admission medications   Medication Sig Start Date End Date Taking? Authorizing Provider  albuterol (PROVENTIL HFA;VENTOLIN HFA) 108 (90 BASE) MCG/ACT inhaler Inhale 1 puff into the lungs every 6 (six) hours as needed for wheezing. 11/06/14 11/06/15 Yes Lucille Passy, MD  fluticasone (FLONASE) 50 MCG/ACT nasal spray PLACE 2 SPRAYS INTO BOTH NOSTRILS DAILY 12/04/14  Yes Lucille Passy, MD  hydrochlorothiazide (MICROZIDE) 12.5 MG capsule TAKE ONE CAPSULE BY MOUTH DAILY Patient taking differently: TAKE 12.5 MG BY MOUTH DAILY 05/03/15  Yes Lucille Passy, MD  Potassium 99 MG TABS Take 99 mg by mouth daily.   Yes Historical Provider, MD  Fluticasone-Salmeterol (ADVAIR) 100-50 MCG/DOSE AEPB Inhale 1 puff into the lungs 2 (two) times daily. Patient not taking: Reported on 10/01/2015 07/10/15   Lucille Passy, MD   Allergies  Allergen Reactions  . Amoxicillin-Pot Clavulanate     REACTION: gi upset Has patient had a PCN reaction causing immediate rash, facial/tongue/throat swelling, SOB or lightheadedness with hypotension: No Has patient had a PCN reaction causing severe rash involving mucus membranes or skin necrosis: No Has patient had a PCN reaction that required hospitalization :  No Has patient had a PCN reaction occurring within the last 10  years: No If all of the above answers are "NO", then may proceed with Cephalosporin use.   . Cefdinir     REACTION: gi  upset  . Moxifloxacin     REACTION: rash  . Singulair [Montelukast Sodium]     Itching     FAMILY HISTORY:  family history includes Asthma in her mother; Dementia in her mother; Glaucoma in her brother; Vascular Disease in her father. SOCIAL HISTORY:  reports that she quit smoking about 19 months ago. Her smoking use included Cigarettes. She has a 40 pack-year smoking history. She has never used smokeless tobacco. She reports that she does not drink alcohol or use illicit drugs.  REVIEW OF SYSTEMS:   Constitutional: Negative for fever, chills, weight loss, malaise/fatigue and diaphoresis.  HENT: Negative for hearing loss, ear pain, nosebleeds, congestion, sore throat, neck pain, tinnitus and ear discharge.   Eyes: Negative for blurred vision, double vision, photophobia, pain, discharge and redness.  Respiratory: Negative for cough, hemoptysis, sputum production, shortness of breath, wheezing and stridor.   Cardiovascular: Negative for chest pain, palpitations, orthopnea, claudication, leg swelling and PND.  Gastrointestinal: Negative for heartburn, nausea, vomiting, abdominal pain, diarrhea, constipation, blood in stool and melena.  Genitourinary: Negative for dysuria, urgency, frequency, hematuria and flank pain.  Musculoskeletal: Negative for myalgias, back pain, joint pain and falls.  Skin: Negative for itching and rash.  Neurological: Negative for dizziness, tingling, tremors, sensory change, speech change, focal weakness, seizures, loss of consciousness, weakness and headaches.  Endo/Heme/Allergies: Negative for environmental allergies and polydipsia. Does not bruise/bleed easily.  SUBJECTIVE:  No distress  VITAL SIGNS: Temp:  [97.4 F (36.3 C)-98.6 F (37 C)] 98.1 F (36.7 C) (01/25 1217) Pulse Rate:  [80-84] 80 (01/25 1217) Resp:  [15-20] 18 (01/25  1217) BP: (85-94)/(46-59) 87/56 mmHg (01/25 1217) SpO2:  [84 %-100 %] 97 % (01/25 1217) Weight:  [166 lb 6.4 oz (75.479 kg)] 166 lb 6.4 oz (75.479 kg) (01/25 0606)  PHYSICAL EXAMINATION: General: Alert female, no acute distress  Neuro:  Alert and oriented, responses appropriately  HEENT: Normocephalic, mucosa moist and intact   Cardiovascular: RRR, no MRG, +3 edema of bilaterally lower extremities  Lungs: Clear bilaterally, no wheezes or use of accessory muscles, on 2.5 L of supplemental oxygen  Abdomen: Distended, non-tender  Musculoskeletal: No joint deformities Skin: warm and intact, no rashes or acute injury    Recent Labs Lab 10/01/15 1925 10/02/15 0740 10/03/15 0400  NA 143 146* 144  K 3.1* 3.5 3.6  CL 97* 98* 99*  CO2 32 34* 33*  BUN _0 CREATININE 1.05* 1.13* 1.12*  GLUCOSE 145* 94 102*    Recent Labs Lab 10/01/15 1925 10/03/15 0400  HGB 16.7* 15.0  HCT 52.0* 48.4*  WBC 6.0 4.9  PLT 188 193   Dg Chest 2 View  10/01/2015  CLINICAL DATA:  Worsening of lower extremity edema, shortness of breath; history of COPD and community-acquired pneumonia, former smoker. EXAM: CHEST  2 VIEW COMPARISON:  Chest x-ray of February 25, 2014 and CT scan of the chest of February 26, 2014. FINDINGS: The lungs are mildly hyperinflated. There is no focal infiltrate. There is no pleural effusion. The cardiac silhouette is enlarged. The pulmonary vascularity is prominent centrally but there is no cephalization of the vascular pattern. The mediastinum is normal in width. There is calcification in the wall of the aortic arch. There is multilevel  degenerative disc disease of the thoracic spine. There is mild dextrocurvature centered in the mid thoracic spine which is not new. IMPRESSION: Stable cardiomegaly and central pulmonary vascular prominence superimposed upon COPD. There is no acute pneumonia nor pulmonary edema. Electronically Signed   By: David  Martinique M.D.   On: 10/01/2015 17:22     ASSESSMENT / PLAN:  Chronic Hypoxic Respiratory Failure  in setting severe secondary PAH.  Suspect this is secondary to COPD, chronic interstitial lung diease and also wonder about chronic thromboembolic disease. Her CXR is clear. Not sure we can explain the degree of her PAH by her underlying COPD COPD-->not in acute exacerbation  Chronic interstitial lung disease  Cor Pulmonale  Plan -Goal to Diuresis back to dry weight of 155 lbs as BP tolerates.  -Limit patient oral intake with fluid restriction of 1,200 mL daily -Rule out venous thrombosis with Dopplers of lower extremities  -Consider adding patient home Advair to medication regimen -Patient will possibly need home oxygenation in sitting of Chronic COPD and severe pulmonary hypertension  -we have contacted the heat failure team re: role for pulmonary vasodilator therapy in this setting  Hypotension - Likely due to diuresis and BP medications Plan -Continue to hold home BP medications -Continue to Diuresis as BP tolerates, Start back 1/26.   Erick Colace ACNP-BC [place] Pager # [phone] OR # [phone] if no answer   10/03/2015, 1:04 PM  STAFF NOTE: I, Merrie Roof, MD FACP have personally reviewed patient's available data, including medical history, events of note, physical examination and test results as part of my evaluation. I have discussed with resident/NP and other care providers such as pharmacist, RN and RRT. In addition, I personally evaluated patient and elicited key findings of: pcxr unimpressive for edema although likely a component contribution, her HTC in past ar e high, copd 40 years smoker with CT with int changes related to smoking likely, i am not surprised she desats, she has severe pulm htn which may be out of proportion to just copd and int lung dz, would want to assess dopplers and may need vq, she is likely to need HOME o2 and have eplained that to her that she has likely been  hyopxia for a while, would ambulate and rechech sats after restart lasix in am on a lower dose, ( hypotension likely related to preload reduction in setting severe pa htn), have updated her and her husband, i called ans spoke to [facility] service, she should follow up with them as outpt for pulm htn management  Lavon Paganini. Titus Mould, MD, [place] Pgr: [facility] 10/03/2015 4:51 PM

## 2015-10-03 NOTE — Progress Notes (Signed)
[facility] PROGRESS NOTE  Linda Stevens YSH:[id number] DOB: [birth year] DOA: 10/01/2015 PCP: Arnette Norris, MD  Brief narrative: 70 year old presenting with history of HTN, copd, asthma. She presented with complaints of increase in swelling of her lower extremities and it was presumed patient had acute chf exacerbation. Has had diuresis and found to have grade 1 DD on echocardiogram. But despite diuresis has had continued hypoxia requiring supplemental oxygen (2 liters via [place]). Diuretics were discontinued due to soft blood pressures.  Assessment/Plan: Principal Problem:   CHF exacerbation ([place]) - Patient was diuresed initially and swelling of extremities has improved but without improvement or resolution of hypoxia. - have discontinued lasix due to soft blood pressures - Echocardiogram showed normal EF with grade 1 DD  Hypoxia - suspect etiology may be from lung as source. Will consult pulmonology to help with further work up recommendations and disposition recommendations from their stand point.  Hypotension - Will hold lisinopril - hold lasix - administer small fluid bolus if map < 65  Active Problems:   Hypokalemia - Resolved    COPD II  - No wheezes compensated currently continue home medication regimen - albuterol - May be contributing to principle problem.    Polycythemia - stable  Code Status: full Family Communication: Discussed directly with patient Disposition Plan: Pending further recommendations from specialist   Consultants:  Pulmonology  Procedures:  Echocardiogram  Antibiotics:  None  HPI/Subjective: Patient has no new complaints.  Objective: Filed Vitals:   10/03/15 0801 10/03/15 1217  BP: 85/53 87/56  Pulse:    Temp:    Resp:      Intake/Output Summary (Last 24 hours) at 10/03/15 1218 Last data filed at 10/03/15 0900  Gross per 24 hour  Intake   1090 ml  Output   1520 ml  Net   -430 ml   Filed Weights   10/02/15 0248  10/03/15 0606  Weight: 75.66 kg (166 lb 12.8 oz) 75.479 kg (166 lb 6.4 oz)    Exam:   General:  Patient in no acute distress, alert and awake  Cardiovascular: Regular rate and rhythm, no rubs  Respiratory: No increased work of breathing, no wheezes equal chest rise breathing, Turner in place  Abdomen: Soft, nondistended, nontender  Musculoskeletal: No cyanosis or clubbing   Data Reviewed: Basic Metabolic Panel:  Recent Labs Lab 10/01/15 1531 10/01/15 1925 10/02/15 0009 10/02/15 0740 10/03/15 0400  NA 142 143  --  146* 144  K 3.0* 3.1*  --  3.5 3.6  CL 96 97*  --  98* 99*  CO2 38* 32  --  34* 33*  GLUCOSE 113* 145*  --  94 102*  BUN 17 16  --  16 20  CREATININE 1.04 1.05*  --  1.13* 1.12*  CALCIUM 9.1 9.1  --  9.4 8.7*  MG  --   --  1.9  --   --   PHOS  --   --  4.3  --   --    Liver Function Tests:  Recent Labs Lab 10/01/15 1925  AST 26  ALT 14  ALKPHOS 85  BILITOT 1.9*  PROT 6.3*  ALBUMIN 3.7   No results for input(s): LIPASE, AMYLASE in the last 168 hours. No results for input(s): AMMONIA in the last 168 hours. CBC:  Recent Labs Lab 10/01/15 1925 10/03/15 0400  WBC 6.0 4.9  HGB 16.7* 15.0  HCT 52.0* 48.4*  MCV 103.2* 105.4*  PLT 188 193   Cardiac Enzymes:  Recent Labs Lab 10/02/15 0132 10/02/15 0740  TROPONINI <0.03 <0.03   BNP (last 3 results) No results for input(s): BNP in the last 8760 hours.  ProBNP (last 3 results)  Recent Labs  10/01/15 1531  PROBNP 1159.0*    CBG: No results for input(s): GLUCAP in the last 168 hours.  No results found for this or any previous visit (from the past 240 hour(s)).   Studies: Dg Chest 2 View  10/01/2015  CLINICAL DATA:  Worsening of lower extremity edema, shortness of breath; history of COPD and community-acquired pneumonia, former smoker. EXAM: CHEST  2 VIEW COMPARISON:  Chest x-ray of February 25, 2014 and CT scan of the chest of February 26, 2014. FINDINGS: The lungs are mildly hyperinflated.  There is no focal infiltrate. There is no pleural effusion. The cardiac silhouette is enlarged. The pulmonary vascularity is prominent centrally but there is no cephalization of the vascular pattern. The mediastinum is normal in width. There is calcification in the wall of the aortic arch. There is multilevel degenerative disc disease of the thoracic spine. There is mild dextrocurvature centered in the mid thoracic spine which is not new. IMPRESSION: Stable cardiomegaly and central pulmonary vascular prominence superimposed upon COPD. There is no acute pneumonia nor pulmonary edema. Electronically Signed   By: David  Martinique M.D.   On: 10/01/2015 17:22    Scheduled Meds: . aspirin EC  81 mg Oral Daily  . enoxaparin (LOVENOX) injection  40 mg Subcutaneous Q24H  . fluticasone  1 spray Each Nare Daily  . nitroGLYCERIN  1 inch Topical Once  . potassium chloride  40 mEq Oral BID  . sodium chloride  250 mL Intravenous Once  . sodium chloride  3 mL Intravenous Q12H   Continuous Infusions:   Time spent: > 35 minutes   Velvet Bathe  [facility] Pager [phone]. If 7PM-7AM, please contact night-coverage at [email or web address], password [facility] 10/03/2015, 12:18 PM  LOS: 1 day

## 2015-10-04 ENCOUNTER — Encounter (HOSPITAL_COMMUNITY): Payer: Self-pay | Admitting: Nurse Practitioner

## 2015-10-04 ENCOUNTER — Inpatient Hospital Stay (HOSPITAL_COMMUNITY): Payer: Medicare HMO

## 2015-10-04 DIAGNOSIS — I3139 Other pericardial effusion (noninflammatory): Secondary | ICD-10-CM | POA: Diagnosis present

## 2015-10-04 DIAGNOSIS — J449 Chronic obstructive pulmonary disease, unspecified: Secondary | ICD-10-CM | POA: Diagnosis present

## 2015-10-04 DIAGNOSIS — I272 Other secondary pulmonary hypertension: Secondary | ICD-10-CM

## 2015-10-04 DIAGNOSIS — I319 Disease of pericardium, unspecified: Secondary | ICD-10-CM

## 2015-10-04 DIAGNOSIS — I119 Hypertensive heart disease without heart failure: Secondary | ICD-10-CM | POA: Diagnosis present

## 2015-10-04 DIAGNOSIS — I509 Heart failure, unspecified: Secondary | ICD-10-CM

## 2015-10-04 DIAGNOSIS — I5189 Other ill-defined heart diseases: Secondary | ICD-10-CM | POA: Insufficient documentation

## 2015-10-04 DIAGNOSIS — I2721 Secondary pulmonary arterial hypertension: Secondary | ICD-10-CM | POA: Insufficient documentation

## 2015-10-04 DIAGNOSIS — I5032 Chronic diastolic (congestive) heart failure: Secondary | ICD-10-CM

## 2015-10-04 DIAGNOSIS — I50810 Right heart failure, unspecified: Secondary | ICD-10-CM

## 2015-10-04 DIAGNOSIS — I5033 Acute on chronic diastolic (congestive) heart failure: Secondary | ICD-10-CM

## 2015-10-04 DIAGNOSIS — I2609 Other pulmonary embolism with acute cor pulmonale: Secondary | ICD-10-CM

## 2015-10-04 DIAGNOSIS — J9601 Acute respiratory failure with hypoxia: Secondary | ICD-10-CM

## 2015-10-04 DIAGNOSIS — R600 Localized edema: Secondary | ICD-10-CM | POA: Diagnosis present

## 2015-10-04 DIAGNOSIS — J9621 Acute and chronic respiratory failure with hypoxia: Secondary | ICD-10-CM

## 2015-10-04 DIAGNOSIS — M7989 Other specified soft tissue disorders: Secondary | ICD-10-CM

## 2015-10-04 DIAGNOSIS — I313 Pericardial effusion (noninflammatory): Secondary | ICD-10-CM | POA: Diagnosis present

## 2015-10-04 LAB — BASIC METABOLIC PANEL
ANION GAP: 11 (ref 5–15)
BUN: 20 mg/dL (ref 6–20)
CO2: 35 mmol/L — ABNORMAL HIGH (ref 22–32)
Calcium: 8.8 mg/dL — ABNORMAL LOW (ref 8.9–10.3)
Chloride: 99 mmol/L — ABNORMAL LOW (ref 101–111)
Creatinine, Ser: 1.14 mg/dL — ABNORMAL HIGH (ref 0.44–1.00)
GFR calc Af Amer: 56 mL/min — ABNORMAL LOW (ref >60)
GFR, EST NON AFRICAN AMERICAN: 49 mL/min — AB (ref >60)
Glucose, Bld: 95 mg/dL (ref 65–99)
POTASSIUM: 4.3 mmol/L (ref 3.5–5.1)
SODIUM: 145 mmol/L (ref 135–145)

## 2015-10-04 MED ORDER — TIOTROPIUM BROMIDE MONOHYDRATE 18 MCG IN CAPS
18.0000 ug | ORAL_CAPSULE | Freq: Every day | RESPIRATORY_TRACT | Status: DC
  Administered 2015-10-04 – 2015-10-09 (×6): 18 ug via RESPIRATORY_TRACT
  Filled 2015-10-04 (×2): qty 5

## 2015-10-04 MED ORDER — GUAIFENESIN-DM 100-10 MG/5ML PO SYRP
5.0000 mL | ORAL_SOLUTION | ORAL | Status: DC | PRN
  Administered 2015-10-06 – 2015-10-07 (×2): 5 mL via ORAL
  Filled 2015-10-04 (×2): qty 5

## 2015-10-04 MED ORDER — FUROSEMIDE 10 MG/ML IJ SOLN: 40.0000 mg | Freq: Every day | INTRAMUSCULAR | Status: DC

## 2015-10-04 MED ORDER — TORSEMIDE 20 MG PO TABS
20.0000 mg | ORAL_TABLET | Freq: Two times a day (BID) | ORAL | Status: DC
  Administered 2015-10-04: 20 mg via ORAL
  Filled 2015-10-04 (×2): qty 1

## 2015-10-04 MED ORDER — FUROSEMIDE 40 MG PO TABS
40.0000 mg | ORAL_TABLET | Freq: Once | ORAL | Status: AC
  Administered 2015-10-04: 40 mg via ORAL
  Filled 2015-10-04: qty 1

## 2015-10-04 MED ORDER — MOMETASONE FURO-FORMOTEROL FUM 100-5 MCG/ACT IN AERO
2.0000 | INHALATION_SPRAY | Freq: Two times a day (BID) | RESPIRATORY_TRACT | Status: DC
  Administered 2015-10-04 – 2015-10-09 (×11): 2 via RESPIRATORY_TRACT
  Filled 2015-10-04: qty 8.8

## 2015-10-04 NOTE — Consult Note (Signed)
Cardiology Consult    Patient ID: Linda Stevens MRN: [id number], DOB/AGE: [birth year]   Admit date: 10/01/2015 Date of Consult: 10/04/2015  Primary Physician: Arnette Norris, MD Primary Cardiologist: new - seen by D. Khaidyn Staebell, MD  Requesting Provider: Cathlean Sauer, MD  Patient Profile    70 y/o ?with a h/o HTN and diastolic dysfxn who was admitted 1/23 secondary to progressive DOE, hypoxia, and lower ext edema and has been found to have severe PAH.    Past Medical History   Past Medical History  Diagnosis Date  . Allergic rhinitis due to pollen   . Hypertensive heart disease   . H/O seasonal allergies   . Diastolic dysfunction     a. 02/2014 Echo: EF 65-70%, Gr 1 DD, RVH;  b. 09/2015 Echo: EF 55%, Gr 1 DD.  [name] Acute respiratory failure with hypoxia and hypercapnea 02/26/2014  . Right heart failure with reduced right ventricular function ([place])     a. 09/2015 Echo: EF 55%, Gr 1 DD, D shaped IV septum, sev dil RV with mod reduced fxn, mod dil RA, RV-RA grad 16mHg, PASP 825mg, mod pericard eff w/o tamponade.  . Pericardial effusion     a. 09/2015 Echo: Mod eff w/o tamponade.  . Severe Pulmonary Hypertension     a. 09/2015 Echo: PASP 8745m.  . CMarland KitchenPD (chronic obstructive pulmonary disease) ([facility] . Lower extremity edema   . History of tobacco abuse     a. Quit 2015.    Past Surgical History  Procedure Laterality Date  . Cholecystectomy      Open     Allergies  Allergies  Allergen Reactions  . Amoxicillin-Pot Clavulanate       . Cefdinir     REACTION: gi  upset  . Moxifloxacin     REACTION: rash  . Singulair [Montelukast Sodium]     Itching     History of Present Illness    69 63o ?with a h/o HTN and seasonal allergies.  She also has a h/o chronic, mild DOE and lower ext edema, which she has also attributed to venous insuff (generally not present in the morning).  She does have a h/o tob abuse but quit in 2015.  [facility] exacerb[facility] and CAP.  Echo at that time showed nl LV fxn and grade 1 dd, with RVH.  Following d/c, she says that she felt great while on steroids/inhalers/abx and slowly but surely increased her activity level w/o DOE.  Her activity slowed in the winter of 2015, just r/t the weather and she never really has gotten back to that high level of activity since.  In that setting, she has had some degree of chronic DOE but has no h/o chest pain.    In 07/2015, she had a flu shot and had some malaise for a few days but then returned to her baseline level of activity.  By Christmas 2016 however, she started to note worsening DOE and also worsening lower ext edema.  She was seen by primary care w/ rec for Na restriction and TEDS.  She remained on HCTZ.  Unfortunately, she continued to have DOE and lower ext edema and f/u on 1/23.  She was found to be hypoxic and in some distress.  [facility]  Here, she was placed on IV lasix with net neg of 355 ml since admission.  Wt was stable but is  measured as up two lbs today.  Her dyspnea has persisted and she cont to have significant LEE.  Echo showed nl LV fxn but RV dysfxn and severe PAH.  Moderate pericardial effusion w/o tamponade also noted.  She has been seen by pulm - PAH felt to be out of proportion to COPD.  We've been asked to eval.  Inpatient Medications    . aspirin EC  81 mg Oral Daily  . enoxaparin (LOVENOX) injection  40 mg Subcutaneous Q24H  . fluticasone  1 spray Each Nare Daily  . [START ON 10/05/2015] furosemide  40 mg Intravenous Daily  . mometasone-formoterol  2 puff Inhalation BID  . nitroGLYCERIN  1 inch Topical Once  . potassium chloride  40 mEq Oral BID  . sodium chloride  3 mL Intravenous Q12H  . tiotropium  18 mcg Inhalation Daily    Family History    Family History  Problem Relation Age of Onset  . Glaucoma Brother   . Vascular Disease Father     Died of complications from surgery  . Asthma Mother      Died of asthma attack  . Dementia Mother     Social History    Social History   Social History  . Marital Status: Married    Spouse Name: N/A  . Number of Children: N/A  . Years of Education: N/A   Occupational History  . Not on file.   Social History Main Topics  . Smoking status: Former Smoker -- 1.00 packs/day for 40 years    Types: Cigarettes    Quit date: 02/22/2014  . Smokeless tobacco: Never Used  . Alcohol Use: No  . Drug Use: No  . Sexual Activity: No   Other Topics Concern  . Not on file   Social History Narrative   Married with 2 children.  Independent of ADLs.      Does not have a living will.   Would desire CPR but would not want prolonged life support if futile- husband aware.     Review of Systems    General:  No chills, fever, night sweats or weight changes.  Cardiovascular:  No chest pain, +++ dyspnea on exertion, +++ edema, no orthopnea, palpitations, paroxysmal nocturnal dyspnea. Dermatological: No rash, lesions/masses Respiratory: No cough, +++ dyspnea Urologic: No hematuria, dysuria Abdominal:   No nausea, vomiting, diarrhea, bright red blood per rectum, melena, or hematemesis Neurologic:  No visual changes, wkns, changes in mental status. All other systems reviewed and are otherwise negative except as noted above.  Physical Exam    Blood pressure 92/56, pulse 85, temperature 98 F (36.7 C), temperature source Oral, resp. rate 20, height _0  (1.575 m), weight 168 lb 14 oz (76.6 kg), SpO2 97 %.  General: Pleasant, NAD Psych: Normal affect. Neuro: Alert and oriented X 3. Moves all extremities spontaneously. HEENT: Normal  Neck: Supple without bruits.  JVP ~ 8-10. Lungs:  Resp regular and unlabored, decreased BS throughout  Heart: Distant HS RRR no s3, s4, or murmurs.  Abdomen: Soft, non-tender, non-distended, BS + x 4.  Extremities: No clubbing, cyanosis.  2-3+ bilat LE edema. DP/PT/Radials 2+ and equal bilaterally.  Labs    Troponin  [facility] Test)  Recent Labs  10/01/15 1932  TROPIPOC 0.02    Recent Labs  10/02/15 0132 10/02/15 0740  TROPONINI <0.03 <0.03   Lab Results  Component Value Date   WBC 4.9 10/03/2015   HGB 15.0 10/03/2015   HCT 48.4*  10/03/2015   MCV 105.4* 10/03/2015   PLT 193 10/03/2015    Recent Labs Lab 10/01/15 1925  10/04/15 0408  NA 143  < > 145  K 3.1*  < > 4.3  CL 97*  < > 99*  CO2 32  < > 35*  BUN 16  < > 20  CREATININE 1.05*  < > 1.14*  CALCIUM 9.1  < > 8.8*  PROT 6.3*  --   --   BILITOT 1.9*  --   --   ALKPHOS 85  --   --   ALT 14  --   --   AST 26  --   --   GLUCOSE 145*  < > 95  < > = values in this interval not displayed. Lab Results  Component Value Date   CHOL 188 04/26/2014   HDL 61.60 04/26/2014   LDLCALC 95 04/26/2014   TRIG 159.0* 04/26/2014   Lab Results  Component Value Date   DDIMER 3.70* 10/01/2015     Radiology Studies    Dg Chest 2 View  10/01/2015  CLINICAL DATA:  Worsening of lower extremity edema, shortness of breath; history of COPD and community-acquired pneumonia, former smoker. EXAM: CHEST  2 VIEW COMPARISON:  Chest x-ray of February 25, 2014 and CT scan of the chest of February 26, 2014. FINDINGS: The lungs are mildly hyperinflated. There is no focal infiltrate. There is no pleural effusion. The cardiac silhouette is enlarged. The pulmonary vascularity is prominent centrally but there is no cephalization of the vascular pattern. The mediastinum is normal in width. There is calcification in the wall of the aortic arch. There is multilevel degenerative disc disease of the thoracic spine. There is mild dextrocurvature centered in the mid thoracic spine which is not new. IMPRESSION: Stable cardiomegaly and central pulmonary vascular prominence superimposed upon COPD. There is no acute pneumonia nor pulmonary edema. Electronically Signed   By: David  Martinique M.D.   On: 10/01/2015 17:22    ECG & Cardiac Imaging    RSR, 97, BAE, probable RVH, no  acute ST/T changes.  Assessment & Plan    1. PAH with acute right heart failure 2. Pericardial effusion 3. Acute respiratory failure 4. COPD   Signed, Linda Hodgkins, NP 10/04/2015, 2:06 PM  Patient seen and examined independently. Linda Gins, NP note reviewed carefully - agree with his assessment and plan. I have edited the note based on my findings.   I have reviewed echo and CXR personally. She has severe RHF with PAH of unclear etiology. LE u/s without DVT. VQ pending. CXR with COPD but degree of RHF seems out of proportion to COPD. On echo also has thick pericardium with moderate pericardial effusion. Diuresis limited by hypotension. Suspect primarily WHO Group 3 PAH but may be combination.   Will transfer to [facility] for further w/u. Will need: 1. VQ scan 2. Hi-res chest CT to exclude ILD 3. PFTs with DLCO 4. Serology: ANA, HIV, hepatitis panels, ANCA, RF, anti-ccp 5. R/L HC on Monday after more diuresis as tolerated  D/w Dr. Clementeen Graham.   Linda Tomberlin,MD 3:38 PM

## 2015-10-04 NOTE — Progress Notes (Signed)
Name: Linda Stevens MRN: [id number] DOB: Mar 31, 1946    ADMISSION DATE:  10/01/2015 CONSULTATION DATE:  1/25  REFERRING MD :  Wendee Beavers  CHIEF COMPLAINT:  On-going hypoxia in spite of diuresis   BRIEF PATIENT DESCRIPTION: 70 year old presenting to the emergency department on 1/23 with lower extremity edema which has been progressively worsening. Patient has significant medical history of HTN, copd, asthma. It was presumed patient had acute chf exacerbation and was diuresised. Echocardiogram resulted with a grade 1 DD. Despite diuresis has had continued hypoxia requiring supplemental oxygen (2.5 liters via [place]). Diuretics were discontinued on 1/24 due to soft blood pressures and patient was given a 250 ml bolus of NS.    SIGNIFICANT EVENTS    STUDIES:  - Echocardiogram 1/24: Severely dilated RV with moderately decreased systolic function. D-shaped interventricular septum suggesting RV pressure/volume overload. The LV appears small and compressed by the large RV. Normal LV size and systolic function, EF 73%. There was severe pulmonary hypertension. There was a moderate pericardial effusion with no definitive signs of pericardial tamponade. - Chest X-Ray 1/25: Mildly hyperinflated lungs, calcification in the wall of aortic arch, no focal infiltrate/pleural effusion, enlarged cardiac silhouette, stable cardiomegaly and central pulmonary vascular prominence superimposed upon COPD. No acute pneumonia or pulmonary edema.    HISTORY OF PRESENT ILLNESS:   70 year old female went to primary care physician 3 weeks ago complaining of hypotension and lower extremity edema. Patient states that baseline weight is 155 lbs. presented to Emergency Department weighing 172 lbs, has been diuresised and now weighs 166 lbs. Patient states that she is a 52 year smoker and recently quit last July. On this admission was feeling shortness of breath with mobility, today denies shortness of breath however, with mobility  on room air pulse oxygenation is low 80s. Echocardiogram on 1/24 showed severely dilated RV with moderately decreased systolic function and D-shaped interventricular septum suggesting RV pressure/volume overload. Chest X-ray 1/25 showed mildly hyperinflated lungs with an enlarged cardiac silhouette, with no acute findings. PCCM asked to consult on 1/25 for continued hypoxia.   SUBJECTIVE:  No distress  VITAL SIGNS: Temp:  [97.9 F (36.6 C)-98.1 F (36.7 C)] 98 F (36.7 C) (01/26 0512) Pulse Rate:  [80-88] 85 (01/26 0512) Resp:  [18-20] 20 (01/26 0512) BP: (87-109)/(54-62) 92/56 mmHg (01/26 0512) SpO2:  [97 %-98 %] 97 % (01/26 0512) Weight:  [168 lb 14 oz (76.6 kg)] 168 lb 14 oz (76.6 kg) (01/26 0512)  PHYSICAL EXAMINATION: General: Alert female, no acute distress , eating Neuro:  Alert and oriented, responses appropriately  HEENT: Normocephalic, mucosa moist and intact   Cardiovascular: RRR, no MRG, +2 edema of bilaterally lower extremities  Lungs: Clear bilaterally, no wheezes or use of accessory muscles, on 2.5 L of supplemental oxygen  Abdomen: Distended, non-tender  Musculoskeletal: No joint deformities Skin: warm and intact, no rashes or acute injury    Recent Labs Lab 10/02/15 0740 10/03/15 0400 10/04/15 0408  NA 146* 144 145  K 3.5 3.6 4.3  CL 98* 99* 99*  CO2 34* 33* 35*  BUN _0 CREATININE 1.13* 1.12* 1.14*  GLUCOSE 94 102* 95    Recent Labs Lab 10/01/15 1925 10/03/15 0400  HGB 16.7* 15.0  HCT 52.0* 48.4*  WBC 6.0 4.9  PLT 188 193   No results found. Filed Weights   10/02/15 0248 10/03/15 0606 10/04/15 0512  Weight: 166 lb 12.8 oz (75.66 kg) 166 lb 6.4 oz (75.479 kg) 168 lb  14 oz (76.6 kg)    Intake/Output Summary (Last 24 hours) at 10/04/15 0921 Last data filed at 10/04/15 0509  Gross per 24 hour  Intake    610 ml  Output    575 ml  Net     35 ml   ASSESSMENT / PLAN:  Chronic Hypoxic Respiratory Failure  in setting severe secondary PAH.   Suspect this is secondary to COPD, chronic interstitial lung diease and also wonder about chronic thromboembolic disease. Her CXR is clear. Not sure we can explain the degree of her PAH by her underlying COPD COPD-->not in acute exacerbation  Chronic interstitial lung disease  Cor Pulmonale  Plan -Goal to Diuresis back to dry weight of 155 lbs as BP tolerates.  -Limit patient oral intake with fluid restriction of 1,200 mL daily -Rule out venous thrombosis with Dopplers of lower extremities (pending) -Consider adding patient home Advair to medication regimen -Patient will possibly need home oxygenation in sitting of Chronic COPD and severe pulmonary hypertension  -we have contacted the heat failure team re: role for pulmonary vasodilator therapy in this setting  Hypotension - Likely due to diuresis and BP medications Plan -Continue to hold home BP medications -Continue to Diuresis as BP tolerates, Start back 1/26.   Richardson Landry Debara Kamphuis ACNP Maryanna Shape PCCM Pager [phone] till 3 pm If no answer page [phone] 10/04/2015, 9:21 AM

## 2015-10-04 NOTE — Progress Notes (Signed)
Patients is stable at this time A&Ox4, ambulatory. VS 80, 118/60, 96% 3lt. Will transfer to [facility] [id number]. Reports called to [place], [place]

## 2015-10-04 NOTE — Progress Notes (Addendum)
[facility] PROGRESS NOTE  Linda Stevens XNT:[id number] DOB: 1946/04/28 DOA: 10/01/2015 PCP: Arnette Norris, MD  70 year old female with history of hypertension, COPD and asthma, history of diastolic dysfunction presented to the ED due to progressively worsening dyspnea with abdominal and lower extremity swelling of the past few weeks. Her O2 sats were in the low 80s on room air. She also reported orthopnea and PND for the past 2 days. Workup in the ED showed hypokalemia and elevated BNP of 1159. Chest x-ray showed pulmonary vascular prominence. Patient admitted for acute hypoxic respiratory failure secondary to severe pulmonary artery hypertension and cor pulmonale.  Assessment/Plan: Acute hypoxic respiratory failure Secondary to cor pulmonale and severe pulmonary artery hypertension. 2-D echo done showing grade 1 diastolic dysfunction and severely elevated pulmonary artery pressure of 87 mmHg. Diuresing with Lasix as tolerated since blood pressure is soft. Monitor strict I/O and daily weight. She needs about 13 pounds above her normal weight (155 pounds). Continue fluid dissection. -Doppler lower extremity negative for DVT. Given acutely worsened dyspnea will check VQ scan to rule out PE. -Cardiology consulted to evaluate for right heart catheterization. -Titrate Lasix as blood pressure permits. Appreciate pulmonary consult. Will likely need home O2 upon discharge.  COPD Not in exacerbation. Added Spiriva.albuterol nebulizer as needed. Patient was on Advair at home but not taking at present. Ordered dulera. Added antitussives.  Hypotension HCTZ held. Continue Lasix with close monitoring.  DVT prophylaxis: Subcutaneous Lovenox  Diet: Heart healthy    Code Status: Full code Family Communication: None at bedside Disposition Plan: Continue inpatient monitoring.   Consultants:  Pulmonary  Cardiology  Procedures:  2-D echo  Doppler lower extremity  VQ scan  ordered  Antibiotics: None  HPI/Subjective: Seen and examined. Still has dyspnea on exertion and bilateral leg swelling. Blood pressure remains soft  Objective: Filed Vitals:   10/03/15 2144 10/04/15 0512  BP: 109/57 92/56  Pulse: 88 85  Temp: 98 F (36.7 C) 98 F (36.7 C)  Resp: 20 20    Intake/Output Summary (Last 24 hours) at 10/04/15 1317 Last data filed at 10/04/15 0509  Gross per 24 hour  Intake    360 ml  Output    275 ml  Net     85 ml   Filed Weights   10/02/15 0248 10/03/15 0606 10/04/15 0512  Weight: 75.66 kg (166 lb 12.8 oz) 75.479 kg (166 lb 6.4 oz) 76.6 kg (168 lb 14 oz)    Exam:   General: Not in distress  HEENT: Moist mucosa  Chest: Diminished bibasilar breath sounds  CVS: Normal S1 and S2, no murmurs  Gen.: Soft, pitting abdominal wall edema, bowel sounds present, nontender,  Musculoskeletal: 2+ pitting edema bilaterally    Data Reviewed: Basic Metabolic Panel:  Recent Labs Lab 10/01/15 1531 10/01/15 1925 10/02/15 0009 10/02/15 0740 10/03/15 0400 10/04/15 0408  NA 142 143  --  146* 144 145  K 3.0* 3.1*  --  3.5 3.6 4.3  CL 96 97*  --  98* 99* 99*  CO2 38* 32  --  34* 33* 35*  GLUCOSE 113* 145*  --  94 102* 95  BUN 17 16  --  _0 CREATININE 1.04 1.05*  --  1.13* 1.12* 1.14*  CALCIUM 9.1 9.1  --  9.4 8.7* 8.8*  MG  --   --  1.9  --   --   --   PHOS  --   --  4.3  --   --   --  Liver Function Tests:  Recent Labs Lab 10/01/15 1925  AST 26  ALT 14  ALKPHOS 85  BILITOT 1.9*  PROT 6.3*  ALBUMIN 3.7   No results for input(s): LIPASE, AMYLASE in the last 168 hours. No results for input(s): AMMONIA in the last 168 hours. CBC:  Recent Labs Lab 10/01/15 1925 10/03/15 0400  WBC 6.0 4.9  HGB 16.7* 15.0  HCT 52.0* 48.4*  MCV 103.2* 105.4*  PLT 188 193   Cardiac Enzymes:  Recent Labs Lab 10/02/15 0132 10/02/15 0740  TROPONINI <0.03 <0.03   BNP (last 3 results) No results for input(s): BNP in the last  8760 hours.  ProBNP (last 3 results)  Recent Labs  10/01/15 1531  PROBNP 1159.0*    CBG: No results for input(s): GLUCAP in the last 168 hours.  No results found for this or any previous visit (from the past 240 hour(s)).   Studies: No results found.  Scheduled Meds: . aspirin EC  81 mg Oral Daily  . enoxaparin (LOVENOX) injection  40 mg Subcutaneous Q24H  . fluticasone  1 spray Each Nare Daily  . mometasone-formoterol  2 puff Inhalation BID  . nitroGLYCERIN  1 inch Topical Once  . potassium chloride  40 mEq Oral BID  . sodium chloride  3 mL Intravenous Q12H  . tiotropium  18 mcg Inhalation Daily   Continuous Infusions:      Time spent: [place] [place] [facility] Pager [phone] If 7PM-7AM, please contact night-coverage at [email or web address], password [facility] 10/04/2015, 1:17 PM  LOS: 2 days

## 2015-10-04 NOTE — Progress Notes (Signed)
*  PRELIMINARY RESULTS* Vascular Ultrasound Lower extremity venous duplex has been completed.  Preliminary findings: No evidence of DVT or baker's cyst.   Landry Mellow, RDMS, RVT  10/04/2015, 11:40 AM

## 2015-10-05 ENCOUNTER — Inpatient Hospital Stay (HOSPITAL_COMMUNITY): Payer: Medicare HMO

## 2015-10-05 DIAGNOSIS — I110 Hypertensive heart disease with heart failure: Principal | ICD-10-CM

## 2015-10-05 DIAGNOSIS — R600 Localized edema: Secondary | ICD-10-CM

## 2015-10-05 MED ORDER — FUROSEMIDE 10 MG/ML IJ SOLN
40.0000 mg | Freq: Two times a day (BID) | INTRAMUSCULAR | Status: DC
  Administered 2015-10-05 – 2015-10-08 (×7): 40 mg via INTRAVENOUS
  Filled 2015-10-05 (×7): qty 4

## 2015-10-05 MED ORDER — SPIRONOLACTONE 25 MG PO TABS
12.5000 mg | ORAL_TABLET | Freq: Every day | ORAL | Status: DC
  Administered 2015-10-05 – 2015-10-08 (×4): 12.5 mg via ORAL
  Filled 2015-10-05 (×4): qty 1

## 2015-10-05 NOTE — Progress Notes (Signed)
Patient has been experiencing cramps in her hands when taking blood pressures around 8 am.  Patient experienced 3 cramps in right hand around 10 am while not taking blood pressure.  I notified Dr. Wynelle Cleveland at 10:20 am in person.

## 2015-10-05 NOTE — Progress Notes (Signed)
Name: Linda Stevens MRN: [id number] DOB: Feb 10, 1946    ADMISSION DATE:  10/01/2015 CONSULTATION DATE:  1/25  REFERRING MD :  Wendee Beavers  CHIEF COMPLAINT:  On-going hypoxia in spite of diuresis   BRIEF PATIENT DESCRIPTION: 70 year old presenting to the emergency department on 1/23 with lower extremity edema which has been progressively worsening, progressive dyspnea. Patient has significant medical history of HTN + DD, copd, asthma. It was presumed patient had acute chf exacerbation and was diuresised. Echocardiogram resulted with a grade 1 DD, dilated RV and secondary PAH. Despite diuresis has had continued hypoxia requiring supplemental oxygen (2.5 liters via [place]).   SIGNIFICANT EVENTS    STUDIES:  - Echocardiogram 1/24: Severely dilated RV with moderately decreased systolic function. D-shaped interventricular septum suggesting RV pressure/volume overload. The LV appears small and compressed by the large RV. Normal LV size and systolic function, EF 35%. There was severe pulmonary hypertension, est PASP 51mHg. There was a moderate pericardial effusion with no definitive signs of pericardial tamponade. - Chest X-Ray 1/25: Mildly hyperinflated lungs, calcification in the wall of aortic arch, no focal infiltrate/pleural effusion, enlarged cardiac silhouette, stable cardiomegaly and central pulmonary vascular prominence superimposed upon COPD. No acute pneumonia or pulmonary edema.  - Chest CT 1/27 >> no evidence for ILD, moderate pericardial fluid and thickening, small R effusion w some atx, changes of COPD - Autoimmune labs 1/27:   ANA >>   RF >>  C3 and C4 >>   Anti-scl 70 >>  Anti-CCP >>  SSA (Ro) >>  SSB (La) >>  Anti-Jo >> - Rapid HIV 1/27 >>      HISTORY OF PRESENT ILLNESS:   [age]year old female went to primary care physician 3 weeks ago complaining of hypotension and lower extremity edema. Patient states that baseline weight is 155 lbs. presented to Emergency Department weighing  172 lbs, has been diuresised and now weighs 166 lbs. Patient states that she is a 434year smoker and recently quit last July. On this admission was feeling shortness of breath with mobility, today denies shortness of breath however, with mobility on room air pulse oxygenation is low 80s. Echocardiogram on 1/24 showed severely dilated RV with moderately decreased systolic function and D-shaped interventricular septum suggesting RV pressure/volume overload. Chest X-ray 1/25 showed mildly hyperinflated lungs with an enlarged cardiac silhouette, with no acute findings. PCCM asked to consult on 1/25 for continued hypoxia.   SUBJECTIVE:   VITAL SIGNS: Temp:  [97.9 F (36.6 C)-98.4 F (36.9 C)] 97.9 F (36.6 C) (01/27 0800) Pulse Rate:  [78-90] 90 (01/27 0842) Resp:  [20] 20 (01/27 0842) BP: (83-128)/(61-64) 128/64 mmHg (01/27 0842) SpO2:  [94 %-98 %] 94 % (01/27 0842) Weight:  [74.98 kg (165 lb 4.8 oz)-75.841 kg (167 lb 3.2 oz)] 74.98 kg (165 lb 4.8 oz) (01/27 0435)  PHYSICAL EXAMINATION: General: Alert female, no acute distress , eating Neuro:  Alert and oriented, responses appropriately  HEENT: Normocephalic, mucosa moist and intact   Cardiovascular: RRR, no MRG, +2 edema of bilaterally lower extremities  Lungs: Clear bilaterally, no wheezes or use of accessory muscles, on 2.5 L of supplemental oxygen  Abdomen: Distended, non-tender  Musculoskeletal: No joint deformities Skin: warm and intact, no rashes or acute injury    Recent Labs Lab 10/02/15 0740 10/03/15 0400 10/04/15 0408  NA 146* 144 145  K 3.5 3.6 4.3  CL 98* 99* 99*  CO2 34* 33* 35*  BUN _0 CREATININE 1.13* 1.12* 1.14*  GLUCOSE 94 102* 95    Recent Labs Lab 10/01/15 1925 10/03/15 0400  HGB 16.7* 15.0  HCT 52.0* 48.4*  WBC 6.0 4.9  PLT 188 193   Ct Chest High Resolution  10/05/2015  CLINICAL DATA:  70 year old female with shortness of breath and low blood pressure. EXAM: CT CHEST WITHOUT CONTRAST  TECHNIQUE: Multidetector CT imaging of the chest was performed following the standard protocol without intravenous contrast. High resolution imaging of the lungs, as well as inspiratory and expiratory imaging, was performed. COMPARISON:  Chest CT 02/26/2014. FINDINGS: Mediastinum/Lymph Nodes: Heart size is normal. Moderate pericardial effusion, which slightly distorts normal cardiac contours. No pericardial calcification. There is atherosclerosis of the thoracic aorta, the great vessels of the mediastinum and the coronary arteries, including calcified atherosclerotic plaque in the left anterior descending left circumflex and right coronary arteries. Multiple borderline enlarged and mildly enlarged mediastinal lymph nodes, measuring up to 1.3 cm in short axis in the low right paratracheal nodal station, similar to the prior study from 02/26/2014. Esophagus is unremarkable in appearance. No axillary lymphadenopathy Lungs/Pleura: Small right pleural effusion layering dependently. Minimal dependent subsegmental atelectasis in the right lower lobe. No acute consolidative airspace disease. No left pleural effusion. No suspicious appearing pulmonary nodules or masses. Mild diffuse bronchial wall thickening with mild centrilobular emphysema. High-resolution images demonstrate no significant areas of ground-glass attenuation, subpleural reticulation, parenchymal banding, traction bronchiectasis or frank honeycombing to indicate interstitial lung disease. Inspiratory and expiratory imaging is unremarkable. Upper abdomen: Trace amount of perihepatic ascites. Liver has a slightly nodular contour, suggesting underlying cirrhosis. Atherosclerosis in the abdominal aorta. Status post cholecystectomy. Musculoskeletal: There are no aggressive appearing lytic or blastic lesions noted in the visualized portions of the skeleton. IMPRESSION: 1. No findings to suggest interstitial lung disease. 2. Moderate amount of pericardial fluid  and/or thickening, with slight distortion of normal cardiac contours. If there is any clinical concern for constrictive physiology, further evaluation with dedicated echocardiography should be considered. No pericardial calcification is noted at this time. 3. Small right pleural effusion with minimal subsegmental atelectasis in the right lower lobe. 4. Mild diffuse bronchial thickening with mild centrilobular emphysema; imaging findings suggestive of underlying COPD. 5. Atherosclerosis, including three-vessel coronary artery disease. Please note that although the presence of coronary artery calcium documents the presence of coronary artery disease, the severity of this disease and any potential stenosis cannot be assessed on this non-gated CT examination. Assessment for potential risk factor modification, dietary therapy or pharmacologic therapy may be warranted, if clinically indicated. 6. Stigmata of cirrhosis.  Trace volume of ascites. 7. Status post cholecystectomy. Electronically Signed   By: Vinnie Langton M.D.   On: 10/05/2015 10:09   Nm Pulmonary Perf And Vent  10/04/2015  CLINICAL DATA:  Respiratory failure EXAM: NUCLEAR MEDICINE VENTILATION - PERFUSION LUNG SCAN TECHNIQUE: Ventilation images were obtained in multiple projections using inhaled aerosol Tc-42mDTPA. Perfusion images were obtained in multiple projections after intravenous injection of Tc-923mAA. RADIOPHARMACEUTICALS:  3216.1[place] [id number]-11BTPA aerosol inhalation and 4.0 millicurie TeJYNWGNFAOZ-30QAA IV COMPARISON:  Chest x-ray 10/01/2015 FINDINGS: Ventilation: No segmental ventilation defect. There is some clumping of the tracer in central airways Perfusion: No wedge shaped peripheral perfusion defects to suggest acute pulmonary embolism. Chest x-ray shows cardiomegaly. Mild hyperinflation. Mild elevation of the right hemidiaphragm. No infiltrate. Central mild vascular congestion. Findings are low probability for pulmonary  embolus. IMPRESSION: Low probability for pulmonary embolus. Electronically Signed   By: LiLahoma Crocker.D.   On:  10/04/2015 15:39   Filed Weights   10/04/15 0512 10/04/15 2116 10/05/15 0435  Weight: 76.6 kg (168 lb 14 oz) 75.841 kg (167 lb 3.2 oz) 74.98 kg (165 lb 4.8 oz)    Intake/Output Summary (Last 24 hours) at 10/05/15 1017 Last data filed at 10/05/15 1005  Gross per 24 hour  Intake    485 ml  Output    800 ml  Net   -315 ml   ASSESSMENT / PLAN: PAH with Cor Pulmonale, presumed secondary. Hx COPD, plus probable coexisting restriction due to obesity (severely decreased FEV1 on spirometry), HTN / diastolic CHF. At risk also for OSA / OHS w nocturnal hypoxemia. CT chest does not support ILD as a contributor. V/q reassuring, without evidence for chronic VTE. ? Relevance of pericardial thickening, could be a sign of auto-immune process.   Recommend:  -Continued Goal to Diuresis back to dry weight of 155 lbs as her BP tolerates.  -Limit patient oral intake with fluid restriction of 1,200 mL daily -Adjust scheduled BD's to address contribution COPD; agree with repeat PFT -check HIV -will send auto-immune labs to assess for inflammatory contribution to [facility] -consider an eval for portopulmonary HTN, although I do not see any clinical evidence to support this -she needs a R (and probably L) heart cath once we believe we have her volume status optimized, goal this admission -unclear that she is a candidate for chronic PAH targeted meds at this time. May be possible depending on how the workup unfolds especially sleep study, auto-immune labs.  -will likely need to be on chronic anticoagulation when appropriate to initiate  -relevance of her pericardial thickening unclear to me, would appreciate Cardiology's opinion about this.   COPD - Agree with Ruthe Mannan (formulary sub for Advair) + Spiriva + albuterol prn  Baltazar Apo, MD, PhD 10/05/2015, 10:37 AM  Pulmonary and Critical Care [phone]  or if no answer [phone]

## 2015-10-05 NOTE — Progress Notes (Signed)
Advanced Heart Failure Rounding Note   Subjective:    VQ scan low prob. Pending CT chest today.   Diuresing slowly on oral torsemide. Weight down 2 pounds. No dyspnea at rest. +DOE.    Objective:   Weight Range:  Vital Signs:   Temp:  [98 F (36.7 C)-98.4 F (36.9 C)] 98.4 F (36.9 C) (01/26 2116) Pulse Rate:  [81-85] 85 (01/26 2116) Resp:  [20] 20 (01/26 2116) BP: (104-118)/(62) 104/62 mmHg (01/26 2116) SpO2:  [97 %-98 %] 98 % (01/26 2116) Weight:  [74.98 kg (165 lb 4.8 oz)-75.841 kg (167 lb 3.2 oz)] 74.98 kg (165 lb 4.8 oz) (01/27 0435) Last BM Date: 10/04/15  Weight change: Filed Weights   10/04/15 0512 10/04/15 2116 10/05/15 0435  Weight: 76.6 kg (168 lb 14 oz) 75.841 kg (167 lb 3.2 oz) 74.98 kg (165 lb 4.8 oz)    Intake/Output:   Intake/Output Summary (Last 24 hours) at 10/05/15 0513 Last data filed at 10/05/15 0258  Gross per 24 hour  Intake    240 ml  Output    800 ml  Net   -560 ml     Physical Exam: General: Pleasant, NAD Psych: Normal affect. Neuro: Alert and oriented X 3. Moves all extremities spontaneously. HEENT: Normal Neck: Supple without bruits. JVP ~ 10. Lungs: Resp regular and unlabored, decreased BS throughout  Heart: Distant HS RRR no s3, s4, or murmurs. +RV lift Abdomen: Soft, non-tender, non-distended, BS + x 4.  Extremities: No clubbing, cyanosis. 3+ bilat LE edema into thighs. DP/PT/Radials 2  Telemetry: NSR 80s  Labs: Basic Metabolic Panel:  Recent Labs Lab 10/01/15 1531 10/01/15 1925 10/02/15 0009 10/02/15 0740 10/03/15 0400 10/04/15 0408  NA 142 143  --  146* 144 145  K 3.0* 3.1*  --  3.5 3.6 4.3  CL 96 97*  --  98* 99* 99*  CO2 38* 32  --  34* 33* 35*  GLUCOSE 113* 145*  --  94 102* 95  BUN 17 16  --  _0 CREATININE 1.04 1.05*  --  1.13* 1.12* 1.14*  CALCIUM 9.1 9.1  --  9.4 8.7* 8.8*  MG  --   --  1.9  --   --   --   PHOS  --   --  4.3  --   --   --     Liver Function Tests:  Recent  Labs Lab 10/01/15 1925  AST 26  ALT 14  ALKPHOS 85  BILITOT 1.9*  PROT 6.3*  ALBUMIN 3.7   No results for input(s): LIPASE, AMYLASE in the last 168 hours. No results for input(s): AMMONIA in the last 168 hours.  CBC:  Recent Labs Lab 10/01/15 1925 10/03/15 0400  WBC 6.0 4.9  HGB 16.7* 15.0  HCT 52.0* 48.4*  MCV 103.2* 105.4*  PLT 188 193    Cardiac Enzymes:  Recent Labs Lab 10/02/15 0132 10/02/15 0740  TROPONINI <0.03 <0.03    BNP: BNP (last 3 results) No results for input(s): BNP in the last 8760 hours.  ProBNP (last 3 results)  Recent Labs  10/01/15 1531  PROBNP 1159.0*      Other results:  Imaging: Nm Pulmonary Perf And Vent  10/04/2015  CLINICAL DATA:  Respiratory failure EXAM: NUCLEAR MEDICINE VENTILATION - PERFUSION LUNG SCAN TECHNIQUE: Ventilation images were obtained in multiple projections using inhaled aerosol Tc-38mDTPA. Perfusion images were obtained in multiple projections after intravenous injection of Tc-939mAA. RADIOPHARMACEUTICALS:  43.7 millicurie CKFWBLTGAI-90S DTPA aerosol inhalation and 4.0 millicurie MMOCAREQJE-83G MAA IV COMPARISON:  Chest x-ray 10/01/2015 FINDINGS: Ventilation: No segmental ventilation defect. There is some clumping of the tracer in central airways Perfusion: No wedge shaped peripheral perfusion defects to suggest acute pulmonary embolism. Chest x-ray shows cardiomegaly. Mild hyperinflation. Mild elevation of the right hemidiaphragm. No infiltrate. Central mild vascular congestion. Findings are low probability for pulmonary embolus. IMPRESSION: Low probability for pulmonary embolus. Electronically Signed   By: Lahoma Crocker M.D.   On: 10/04/2015 15:39      Medications:     Scheduled Medications: . aspirin EC  81 mg Oral Daily  . enoxaparin (LOVENOX) injection  40 mg Subcutaneous Q24H  . fluticasone  1 spray Each Nare Daily  . mometasone-formoterol  2 puff Inhalation BID  . nitroGLYCERIN  1 inch Topical Once  .  potassium chloride  40 mEq Oral BID  . sodium chloride  3 mL Intravenous Q12H  . tiotropium  18 mcg Inhalation Daily  . torsemide  20 mg Oral BID     Infusions:     PRN Medications:  albuterol, guaiFENesin-dextromethorphan, zolpidem   Assessment:   1. PAH with acute right heart failure 2. Pericardial effusion 3. Acute respiratory failure 4. COPD  Plan/Discussion:    Still with marked volume overload. Will switch back to IV lasix but need to watch closely for hypotension. Place ted hose and add low dose spiro.   Etiology of PAH and RV failure remains unclear. Group 3 is leading suspect but she has progressed rapidly since 2015 and I suspect may be out of proportion to COPD.   Will order chest CT today to assess for ILD. Check PFTs with DLCO. Await serologies.   Plan R/L heart cath on Monday.     Length of Stay: 3   Shontae Rosiles MD 10/05/2015, 5:13 AM  Advanced Heart Failure Team Pager [phone] (M-F; [place])  Please contact [facility] for night-coverage after hours (4p -7a ) and weekends on [email or web address]

## 2015-10-05 NOTE — Care Management Important Message (Signed)
Important Message  Patient Details  Name: KASSIDIE HENDRIKS MRN: [id number] Date of Birth: 20-Feb-1946   Medicare Important Message Given:  Yes    Barb Merino Starlit Raburn 10/05/2015, 3:46 PM

## 2015-10-05 NOTE — Progress Notes (Signed)
[facility] PROGRESS NOTE  FARRELL BROERMAN RJP:[id number] DOB: 1946/08/27 DOA: 10/01/2015 PCP: Arnette Norris, MD  70 year old female with history of hypertension, COPD and asthma, history of diastolic dysfunction presented to the ED due to progressively worsening dyspnea with abdominal and lower extremity swelling of the past few weeks. Her O2 sats were in the low 80s on room air. She also reported orthopnea and PND for the past 2 days. Workup in the ED showed hypokalemia and elevated BNP of 1159. Chest x-ray showed pulmonary vascular prominence. Patient admitted for acute hypoxic respiratory failure secondary to severe pulmonary artery hypertension and cor pulmonale.  Assessment/Plan: Acute hypoxic respiratory failure Secondary to cor pulmonale and severe pulmonary artery hypertension. 2-D echo done showing grade 1 diastolic dysfunction and severely elevated pulmonary artery pressure of 87 mmHg. Diuresing with Lasix as tolerated since blood pressure is soft. Monitor strict I/O and daily weight. She needs about 13 pounds above her normal weight (155 pounds).   -Doppler lower extremity negative for DVT. Given acutely worsened dyspnea will check VQ scan to rule out PE. -Cardiology consulted to evaluate for right heart catheterization. -Titrate Lasix as blood pressure permits. Appreciate pulmonary consult. Will likely need home O2 upon discharge.  COPD Not in exacerbation. Added Spiriva.albuterol nebulizer as needed. Patient was on Advair at home but not taking at present. Ordered dulera. Added antitussives.  Hypotension HCTZ held. Continue Lasix with close monitoring.  DVT prophylaxis: Subcutaneous Lovenox  Diet: Heart healthy    Code Status: Full code Family Communication: None at bedside Disposition Plan: Continue inpatient monitoring.   Consultants:  Pulmonary  Cardiology  Procedures:  2-D echo  Doppler lower extremity  VQ scan  ordered  Antibiotics: None  HPI/Subjective: Pedal edema has improved. No dypnea.   Objective: Filed Vitals:   10/05/15 1400 10/05/15 1402  BP: 98/62 98/62  Pulse: 82   Temp: 97.7 F (36.5 C)   Resp: 20     Intake/Output Summary (Last 24 hours) at 10/05/15 1735 Last data filed at 10/05/15 1313  Gross per 24 hour  Intake    485 ml  Output   1800 ml  Net  -1315 ml   Filed Weights   10/04/15 0512 10/04/15 2116 10/05/15 0435  Weight: 76.6 kg (168 lb 14 oz) 75.841 kg (167 lb 3.2 oz) 74.98 kg (165 lb 4.8 oz)    Exam:   General: Not in distress  HEENT: Moist mucosa  Chest: Diminished bibasilar breath sounds  CVS: Normal S1 and S2, no murmurs  Gen.: Soft, pitting abdominal wall edema, bowel sounds present, nontender,  Musculoskeletal: 2+ pitting edema bilaterally    Data Reviewed: Basic Metabolic Panel:  Recent Labs Lab 10/01/15 1531 10/01/15 1925 10/02/15 0009 10/02/15 0740 10/03/15 0400 10/04/15 0408  NA 142 143  --  146* 144 145  K 3.0* 3.1*  --  3.5 3.6 4.3  CL 96 97*  --  98* 99* 99*  CO2 38* 32  --  34* 33* 35*  GLUCOSE 113* 145*  --  94 102* 95  BUN 17 16  --  _0 CREATININE 1.04 1.05*  --  1.13* 1.12* 1.14*  CALCIUM 9.1 9.1  --  9.4 8.7* 8.8*  MG  --   --  1.9  --   --   --   PHOS  --   --  4.3  --   --   --    Liver Function Tests:  Recent Labs Lab 10/01/15 1925  AST 26  ALT 14  ALKPHOS 85  BILITOT 1.9*  PROT 6.3*  ALBUMIN 3.7   No results for input(s): LIPASE, AMYLASE in the last 168 hours. No results for input(s): AMMONIA in the last 168 hours. CBC:  Recent Labs Lab 10/01/15 1925 10/03/15 0400  WBC 6.0 4.9  HGB 16.7* 15.0  HCT 52.0* 48.4*  MCV 103.2* 105.4*  PLT 188 193   Cardiac Enzymes:  Recent Labs Lab 10/02/15 0132 10/02/15 0740  TROPONINI <0.03 <0.03   BNP (last 3 results) No results for input(s): BNP in the last 8760 hours.  ProBNP (last 3 results)  Recent Labs  10/01/15 1531  PROBNP 1159.0*     CBG: No results for input(s): GLUCAP in the last 168 hours.  No results found for this or any previous visit (from the past 240 hour(s)).   Studies: Ct Chest High Resolution  10/05/2015  CLINICAL DATA:  69 year old female with shortness of breath and low blood pressure. EXAM: CT CHEST WITHOUT CONTRAST TECHNIQUE: Multidetector CT imaging of the chest was performed following the standard protocol without intravenous contrast. High resolution imaging of the lungs, as well as inspiratory and expiratory imaging, was performed. COMPARISON:  Chest CT 02/26/2014. FINDINGS: Mediastinum/Lymph Nodes: Heart size is normal. Moderate pericardial effusion, which slightly distorts normal cardiac contours. No pericardial calcification. There is atherosclerosis of the thoracic aorta, the great vessels of the mediastinum and the coronary arteries, including calcified atherosclerotic plaque in the left anterior descending left circumflex and right coronary arteries. Multiple borderline enlarged and mildly enlarged mediastinal lymph nodes, measuring up to 1.3 cm in short axis in the low right paratracheal nodal station, similar to the prior study from 02/26/2014. Esophagus is unremarkable in appearance. No axillary lymphadenopathy Lungs/Pleura: Small right pleural effusion layering dependently. Minimal dependent subsegmental atelectasis in the right lower lobe. No acute consolidative airspace disease. No left pleural effusion. No suspicious appearing pulmonary nodules or masses. Mild diffuse bronchial wall thickening with mild centrilobular emphysema. High-resolution images demonstrate no significant areas of ground-glass attenuation, subpleural reticulation, parenchymal banding, traction bronchiectasis or frank honeycombing to indicate interstitial lung disease. Inspiratory and expiratory imaging is unremarkable. Upper abdomen: Trace amount of perihepatic ascites. Liver has a slightly nodular contour, suggesting underlying  cirrhosis. Atherosclerosis in the abdominal aorta. Status post cholecystectomy. Musculoskeletal: There are no aggressive appearing lytic or blastic lesions noted in the visualized portions of the skeleton. IMPRESSION: 1. No findings to suggest interstitial lung disease. 2. Moderate amount of pericardial fluid and/or thickening, with slight distortion of normal cardiac contours. If there is any clinical concern for constrictive physiology, further evaluation with dedicated echocardiography should be considered. No pericardial calcification is noted at this time. 3. Small right pleural effusion with minimal subsegmental atelectasis in the right lower lobe. 4. Mild diffuse bronchial thickening with mild centrilobular emphysema; imaging findings suggestive of underlying COPD. 5. Atherosclerosis, including three-vessel coronary artery disease. Please note that although the presence of coronary artery calcium documents the presence of coronary artery disease, the severity of this disease and any potential stenosis cannot be assessed on this non-gated CT examination. Assessment for potential risk factor modification, dietary therapy or pharmacologic therapy may be warranted, if clinically indicated. 6. Stigmata of cirrhosis.  Trace volume of ascites. 7. Status post cholecystectomy. Electronically Signed   By: Vinnie Langton M.D.   On: 10/05/2015 10:09   Nm Pulmonary Perf And Vent  10/04/2015  CLINICAL DATA:  Respiratory failure EXAM: NUCLEAR MEDICINE VENTILATION - PERFUSION LUNG SCAN TECHNIQUE: Ventilation  images were obtained in multiple projections using inhaled aerosol Tc-63mDTPA. Perfusion images were obtained in multiple projections after intravenous injection of Tc-917mAA. RADIOPHARMACEUTICALS:  3267.4[place] TeADLKZGFUQX-47HTPA aerosol inhalation and 4.0 millicurie TeSVEXOGACGB-84RAA IV COMPARISON:  Chest x-ray 10/01/2015 FINDINGS: Ventilation: No segmental ventilation defect. There is some clumping of the  tracer in central airways Perfusion: No wedge shaped peripheral perfusion defects to suggest acute pulmonary embolism. Chest x-ray shows cardiomegaly. Mild hyperinflation. Mild elevation of the right hemidiaphragm. No infiltrate. Central mild vascular congestion. Findings are low probability for pulmonary embolus. IMPRESSION: Low probability for pulmonary embolus. Electronically Signed   By: LiLahoma Crocker.D.   On: 10/04/2015 15:39    Scheduled Meds: . aspirin EC  81 mg Oral Daily  . enoxaparin (LOVENOX) injection  40 mg Subcutaneous Q24H  . fluticasone  1 spray Each Nare Daily  . furosemide  40 mg Intravenous BID  . mometasone-formoterol  2 puff Inhalation BID  . nitroGLYCERIN  1 inch Topical Once  . potassium chloride  40 mEq Oral BID  . sodium chloride  3 mL Intravenous Q12H  . spironolactone  12.5 mg Oral Daily  . tiotropium  18 mcg Inhalation Daily   Continuous Infusions:      Time spent: 25 MINUTES    [facility] Pager [phone] 7PM-7AM, please contact night-coverage at [email or web address], password [email or web address]/27/2017, 5:35 PM  LOS: 3 days

## 2015-10-06 LAB — BASIC METABOLIC PANEL
Anion gap: 11 (ref 5–15)
BUN: 21 mg/dL — AB (ref 6–20)
CO2: 36 mmol/L — ABNORMAL HIGH (ref 22–32)
CREATININE: 1.23 mg/dL — AB (ref 0.44–1.00)
Calcium: 9.3 mg/dL (ref 8.9–10.3)
Chloride: 96 mmol/L — ABNORMAL LOW (ref 101–111)
GFR calc Af Amer: 51 mL/min — ABNORMAL LOW (ref >60)
GFR, EST NON AFRICAN AMERICAN: 44 mL/min — AB (ref >60)
GLUCOSE: 98 mg/dL (ref 65–99)
POTASSIUM: 4.9 mmol/L (ref 3.5–5.1)
SODIUM: 143 mmol/L (ref 135–145)

## 2015-10-06 LAB — ANTI-SCLERODERMA ANTIBODY: Scleroderma (Scl-70) (ENA) Antibody, IgG: <0.2 AI (ref 0.0–0.9)

## 2015-10-06 LAB — C3 COMPLEMENT: C3 COMPLEMENT: 137 mg/dL (ref 82–167)

## 2015-10-06 LAB — RHEUMATOID FACTOR: RHEUMATOID FACTOR: 99.8 [IU]/mL — AB (ref 0.0–13.9)

## 2015-10-06 LAB — C4 COMPLEMENT: Complement C4, Body Fluid: 19 mg/dL (ref 14–44)

## 2015-10-06 LAB — SJOGRENS SYNDROME-A EXTRACTABLE NUCLEAR ANTIBODY: SSA (Ro) (ENA) Antibody, IgG: <0.2 AI (ref 0.0–0.9)

## 2015-10-06 LAB — ANTI-JO 1 ANTIBODY, IGG

## 2015-10-06 LAB — SJOGRENS SYNDROME-B EXTRACTABLE NUCLEAR ANTIBODY

## 2015-10-06 LAB — HIV ANTIBODY (ROUTINE TESTING W REFLEX): HIV SCREEN 4TH GENERATION: NONREACTIVE

## 2015-10-06 MED ORDER — DIPHENHYDRAMINE HCL 25 MG PO CAPS
25.0000 mg | ORAL_CAPSULE | Freq: Once | ORAL | Status: AC
  Administered 2015-10-06: 25 mg via ORAL
  Filled 2015-10-06: qty 1

## 2015-10-06 NOTE — Progress Notes (Signed)
[facility] PROGRESS NOTE  Linda Stevens GYK:[id number] DOB: 1946/06/29 DOA: 10/01/2015 PCP: Arnette Norris, MD  70 year old female with history of hypertension, COPD and asthma, history of diastolic dysfunction presented to the ED due to progressively worsening dyspnea with abdominal and lower extremity swelling of the past few weeks. Her O2 sats were in the low 80s on room air. She also reported orthopnea and PND for the past 2 days. Workup in the ED showed hypokalemia and elevated BNP of 1159. Chest x-ray showed pulmonary vascular prominence.  HPI/Subjective: Pedal edema has improved but not resolved. No dypnea.   Assessment/Plan: Acute hypoxic respiratory failure - Secondary to severe pulmonary artery hypertension.  - 2-D echo done showing grade 1 diastolic dysfunction and severely elevated pulmonary artery pressure of 87 mmHg. - CT chest mod pericardial effusion +/- pericardial thickening, possible cirrhosis and changes consistent with COPD -Doppler lower extremity negative for DVT  -Cardiology consulted for right heart catheterization. - pulmonary following as well- auto-immune labs sent for [facility]- state she will need chronic anticoagulation  Cirrhosis?  - noted on CT- obtain Hepatitis panel in AM- possible cardiac cirrhosis  COPD No exacerbation -  Spiriva, Dulera, albuterol nebulizer as needed.    Hypotension HCTZ held. Continue Lasix with close monitoring.  DVT prophylaxis: Subcutaneous Lovenox Diet: Heart healthy Code Status: Full code Disposition Plan: home when stable Consultants:  Pulmonary  Cardiology Procedures:  2-D echo  Doppler lower extremity Antibiotics: None   Objective: Filed Vitals:   10/06/15 0814 10/06/15 0937  BP: 89/55 102/58  Pulse: 82   Temp: 97.2 F (36.2 C)   Resp: 16     Intake/Output Summary (Last 24 hours) at 10/06/15 1238 Last data filed at 10/06/15 1000  Gross per 24 hour  Intake    483 ml  Output   3650 ml  Net   -3167 ml   Filed Weights   10/04/15 2116 10/05/15 0435 10/06/15 0440  Weight: 75.841 kg (167 lb 3.2 oz) 74.98 kg (165 lb 4.8 oz) 72.984 kg (160 lb 14.4 oz)    Exam:   General: Not in distress  HEENT: Moist mucosa  Resp: Diminished bibasilar breath sounds  CVS: Normal S1 and S2, no murmurs  GI: no distension, BS +, non-tender  Musculoskeletal: 1+ pitting edema bilaterally    Data Reviewed: Basic Metabolic Panel:  Recent Labs Lab 10/01/15 1925 10/02/15 0009 10/02/15 0740 10/03/15 0400 10/04/15 0408 10/06/15 0315  NA 143  --  146* 144 145 143  K 3.1*  --  3.5 3.6 4.3 4.9  CL 97*  --  98* 99* 99* 96*  CO2 32  --  34* 33* 35* 36*  GLUCOSE 145*  --  94 102* 95 98  BUN 16  --  _0 21*  CREATININE 1.05*  --  1.13* 1.12* 1.14* 1.23*  CALCIUM 9.1  --  9.4 8.7* 8.8* 9.3  MG  --  1.9  --   --   --   --   PHOS  --  4.3  --   --   --   --    Liver Function Tests:  Recent Labs Lab 10/01/15 1925  AST 26  ALT 14  ALKPHOS 85  BILITOT 1.9*  PROT 6.3*  ALBUMIN 3.7   No results for input(s): LIPASE, AMYLASE in the last 168 hours. No results for input(s): AMMONIA in the last 168 hours. CBC:  Recent Labs Lab 10/01/15 1925 10/03/15 0400  WBC 6.0 4.9  HGB  16.7* 15.0  HCT 52.0* 48.4*  MCV 103.2* 105.4*  PLT 188 193   Cardiac Enzymes:  Recent Labs Lab 10/02/15 0132 10/02/15 0740  TROPONINI <0.03 <0.03   BNP (last 3 results) No results for input(s): BNP in the last 8760 hours.  ProBNP (last 3 results)  Recent Labs  10/01/15 1531  PROBNP 1159.0*    CBG: No results for input(s): GLUCAP in the last 168 hours.  No results found for this or any previous visit (from the past 240 hour(s)).   Studies: Ct Chest High Resolution  10/05/2015  CLINICAL DATA:  70 year old female with shortness of breath and low blood pressure. EXAM: CT CHEST WITHOUT CONTRAST TECHNIQUE: Multidetector CT imaging of the chest was performed following the standard protocol  without intravenous contrast. High resolution imaging of the lungs, as well as inspiratory and expiratory imaging, was performed. COMPARISON:  Chest CT 02/26/2014. FINDINGS: Mediastinum/Lymph Nodes: Heart size is normal. Moderate pericardial effusion, which slightly distorts normal cardiac contours. No pericardial calcification. There is atherosclerosis of the thoracic aorta, the great vessels of the mediastinum and the coronary arteries, including calcified atherosclerotic plaque in the left anterior descending left circumflex and right coronary arteries. Multiple borderline enlarged and mildly enlarged mediastinal lymph nodes, measuring up to 1.3 cm in short axis in the low right paratracheal nodal station, similar to the prior study from 02/26/2014. Esophagus is unremarkable in appearance. No axillary lymphadenopathy Lungs/Pleura: Small right pleural effusion layering dependently. Minimal dependent subsegmental atelectasis in the right lower lobe. No acute consolidative airspace disease. No left pleural effusion. No suspicious appearing pulmonary nodules or masses. Mild diffuse bronchial wall thickening with mild centrilobular emphysema. High-resolution images demonstrate no significant areas of ground-glass attenuation, subpleural reticulation, parenchymal banding, traction bronchiectasis or frank honeycombing to indicate interstitial lung disease. Inspiratory and expiratory imaging is unremarkable. Upper abdomen: Trace amount of perihepatic ascites. Liver has a slightly nodular contour, suggesting underlying cirrhosis. Atherosclerosis in the abdominal aorta. Status post cholecystectomy. Musculoskeletal: There are no aggressive appearing lytic or blastic lesions noted in the visualized portions of the skeleton. IMPRESSION: 1. No findings to suggest interstitial lung disease. 2. Moderate amount of pericardial fluid and/or thickening, with slight distortion of normal cardiac contours. If there is any clinical  concern for constrictive physiology, further evaluation with dedicated echocardiography should be considered. No pericardial calcification is noted at this time. 3. Small right pleural effusion with minimal subsegmental atelectasis in the right lower lobe. 4. Mild diffuse bronchial thickening with mild centrilobular emphysema; imaging findings suggestive of underlying COPD. 5. Atherosclerosis, including three-vessel coronary artery disease. Please note that although the presence of coronary artery calcium documents the presence of coronary artery disease, the severity of this disease and any potential stenosis cannot be assessed on this non-gated CT examination. Assessment for potential risk factor modification, dietary therapy or pharmacologic therapy may be warranted, if clinically indicated. 6. Stigmata of cirrhosis.  Trace volume of ascites. 7. Status post cholecystectomy. Electronically Signed   By: Vinnie Langton M.D.   On: 10/05/2015 10:09   Nm Pulmonary Perf And Vent  10/04/2015  CLINICAL DATA:  Respiratory failure EXAM: NUCLEAR MEDICINE VENTILATION - PERFUSION LUNG SCAN TECHNIQUE: Ventilation images were obtained in multiple projections using inhaled aerosol Tc-16mDTPA. Perfusion images were obtained in multiple projections after intravenous injection of Tc-955mAA. RADIOPHARMACEUTICALS:  3241.2ill[place]ie TeINOMVEHMCN-47STPA aerosol inhalation and 4.0 millicurie [id number]-76LAA IV COMPARISON:  Chest x-ray 10/01/2015 FINDINGS: Ventilation: No segmental ventilation defect. There is some clumping of the  tracer in central airways Perfusion: No wedge shaped peripheral perfusion defects to suggest acute pulmonary embolism. Chest x-ray shows cardiomegaly. Mild hyperinflation. Mild elevation of the right hemidiaphragm. No infiltrate. Central mild vascular congestion. Findings are low probability for pulmonary embolus. IMPRESSION: Low probability for pulmonary embolus. Electronically Signed   By: Lahoma Crocker  M.D.   On: 10/04/2015 15:39    Scheduled Meds: . aspirin EC  81 mg Oral Daily  . enoxaparin (LOVENOX) injection  40 mg Subcutaneous Q24H  . fluticasone  1 spray Each Nare Daily  . furosemide  40 mg Intravenous BID  . mometasone-formoterol  2 puff Inhalation BID  . nitroGLYCERIN  1 inch Topical Once  . potassium chloride  40 mEq Oral BID  . sodium chloride  3 mL Intravenous Q12H  . spironolactone  12.5 mg Oral Daily  . tiotropium  18 mcg Inhalation Daily   Continuous Infusions:      Time spent: 25 MINUTES    [facility]  [facility] Pager [phone] If 7PM-7AM, please contact night-coverage at [email or web address], password [facility] 10/06/2015, 12:38 PM  LOS: 4 days

## 2015-10-06 NOTE — Progress Notes (Addendum)
Advanced Heart Failure Rounding Note   Subjective:    VQ scan low prob. CT chest mild COPD. Moderate pericardial effusion. 3v CAD. + cirrhosis. Serology negative so far.   PFTs pending (FEV1 1.23 (52%) with FVC 2.01 (65%) and DLCO 51% in 7/15)   Diuresing well on IV lasix. Weight down 5 pounds. Breathing better. Renal function relatively stable (1.23)   Objective:   Weight Range:  Vital Signs:   Temp:  [97.2 F (36.2 C)-97.9 F (36.6 C)] 97.2 F (36.2 C) (01/28 0814) Pulse Rate:  [82-91] 82 (01/28 0814) Resp:  [16-20] 16 (01/28 0814) BP: (89-102)/(50-65) 102/58 mmHg (01/28 0937) SpO2:  [88 %-96 %] 95 % (01/28 1117) Weight:  [72.984 kg (160 lb 14.4 oz)] 72.984 kg (160 lb 14.4 oz) (01/28 0440) Last BM Date: 10/05/15  Weight change: Filed Weights   10/04/15 2116 10/05/15 0435 10/06/15 0440  Weight: 75.841 kg (167 lb 3.2 oz) 74.98 kg (165 lb 4.8 oz) 72.984 kg (160 lb 14.4 oz)    Intake/Output:   Intake/Output Summary (Last 24 hours) at 10/06/15 1248 Last data filed at 10/06/15 1000  Gross per 24 hour  Intake    483 ml  Output   3650 ml  Net  -3167 ml     Physical Exam: General: Pleasant, NAD Psych: Normal affect. Neuro: Alert and oriented X 3. Moves all extremities spontaneously. HEENT: Normal Neck: Supple without bruits. JVP ~ 10. Lungs: Resp regular and unlabored, decreased BS throughout  Heart: Distant HS RRR no s3, s4, or murmurs. +RV lift Abdomen: Soft, non-tender, non-distended, BS + x 4.  Extremities: No clubbing, cyanosis. 3+ bilat LE edema into thighs. DP/PT/Radials 2  Telemetry: NSR 80s  Labs: Basic Metabolic Panel:  Recent Labs Lab 10/01/15 1925 10/02/15 0009 10/02/15 0740 10/03/15 0400 10/04/15 0408 10/06/15 0315  NA 143  --  146* 144 145 143  K 3.1*  --  3.5 3.6 4.3 4.9  CL 97*  --  98* 99* 99* 96*  CO2 32  --  34* 33* 35* 36*  GLUCOSE 145*  --  94 102* 95 98  BUN 16  --  _0 21*  CREATININE 1.05*  --  1.13*  1.12* 1.14* 1.23*  CALCIUM 9.1  --  9.4 8.7* 8.8* 9.3  MG  --  1.9  --   --   --   --   PHOS  --  4.3  --   --   --   --     Liver Function Tests:  Recent Labs Lab 10/01/15 1925  AST 26  ALT 14  ALKPHOS 85  BILITOT 1.9*  PROT 6.3*  ALBUMIN 3.7   No results for input(s): LIPASE, AMYLASE in the last 168 hours. No results for input(s): AMMONIA in the last 168 hours.  CBC:  Recent Labs Lab 10/01/15 1925 10/03/15 0400  WBC 6.0 4.9  HGB 16.7* 15.0  HCT 52.0* 48.4*  MCV 103.2* 105.4*  PLT 188 193    Cardiac Enzymes:  Recent Labs Lab 10/02/15 0132 10/02/15 0740  TROPONINI <0.03 <0.03    BNP: BNP (last 3 results) No results for input(s): BNP in the last 8760 hours.  ProBNP (last 3 results)  Recent Labs  10/01/15 1531  PROBNP 1159.0*      Other results:  Imaging: Ct Chest High Resolution  10/05/2015  CLINICAL DATA:  70 year old female with shortness of breath and low blood pressure. EXAM: CT CHEST WITHOUT CONTRAST TECHNIQUE: Multidetector CT  imaging of the chest was performed following the standard protocol without intravenous contrast. High resolution imaging of the lungs, as well as inspiratory and expiratory imaging, was performed. COMPARISON:  Chest CT 02/26/2014. FINDINGS: Mediastinum/Lymph Nodes: Heart size is normal. Moderate pericardial effusion, which slightly distorts normal cardiac contours. No pericardial calcification. There is atherosclerosis of the thoracic aorta, the great vessels of the mediastinum and the coronary arteries, including calcified atherosclerotic plaque in the left anterior descending left circumflex and right coronary arteries. Multiple borderline enlarged and mildly enlarged mediastinal lymph nodes, measuring up to 1.3 cm in short axis in the low right paratracheal nodal station, similar to the prior study from 02/26/2014. Esophagus is unremarkable in appearance. No axillary lymphadenopathy Lungs/Pleura: Small right pleural effusion  layering dependently. Minimal dependent subsegmental atelectasis in the right lower lobe. No acute consolidative airspace disease. No left pleural effusion. No suspicious appearing pulmonary nodules or masses. Mild diffuse bronchial wall thickening with mild centrilobular emphysema. High-resolution images demonstrate no significant areas of ground-glass attenuation, subpleural reticulation, parenchymal banding, traction bronchiectasis or frank honeycombing to indicate interstitial lung disease. Inspiratory and expiratory imaging is unremarkable. Upper abdomen: Trace amount of perihepatic ascites. Liver has a slightly nodular contour, suggesting underlying cirrhosis. Atherosclerosis in the abdominal aorta. Status post cholecystectomy. Musculoskeletal: There are no aggressive appearing lytic or blastic lesions noted in the visualized portions of the skeleton. IMPRESSION: 1. No findings to suggest interstitial lung disease. 2. Moderate amount of pericardial fluid and/or thickening, with slight distortion of normal cardiac contours. If there is any clinical concern for constrictive physiology, further evaluation with dedicated echocardiography should be considered. No pericardial calcification is noted at this time. 3. Small right pleural effusion with minimal subsegmental atelectasis in the right lower lobe. 4. Mild diffuse bronchial thickening with mild centrilobular emphysema; imaging findings suggestive of underlying COPD. 5. Atherosclerosis, including three-vessel coronary artery disease. Please note that although the presence of coronary artery calcium documents the presence of coronary artery disease, the severity of this disease and any potential stenosis cannot be assessed on this non-gated CT examination. Assessment for potential risk factor modification, dietary therapy or pharmacologic therapy may be warranted, if clinically indicated. 6. Stigmata of cirrhosis.  Trace volume of ascites. 7. Status post  cholecystectomy. Electronically Signed   By: Vinnie Langton M.D.   On: 10/05/2015 10:09   Nm Pulmonary Perf And Vent  10/04/2015  CLINICAL DATA:  Respiratory failure EXAM: NUCLEAR MEDICINE VENTILATION - PERFUSION LUNG SCAN TECHNIQUE: Ventilation images were obtained in multiple projections using inhaled aerosol Tc-6mDTPA. Perfusion images were obtained in multiple projections after intravenous injection of Tc-964mAA. RADIOPHARMACEUTICALS:  3253.9[place] TeJQBHALPFXT-02ITPA aerosol inhalation and 4.0 millicurie [id number]-42AAA IV COMPARISON:  Chest x-ray 10/01/2015 FINDINGS: Ventilation: No segmental ventilation defect. There is some clumping of the tracer in central airways Perfusion: No wedge shaped peripheral perfusion defects to suggest acute pulmonary embolism. Chest x-ray shows cardiomegaly. Mild hyperinflation. Mild elevation of the right hemidiaphragm. No infiltrate. Central mild vascular congestion. Findings are low probability for pulmonary embolus. IMPRESSION: Low probability for pulmonary embolus. Electronically Signed   By: LiLahoma Crocker.D.   On: 10/04/2015 15:39     Medications:     Scheduled Medications: . aspirin EC  81 mg Oral Daily  . enoxaparin (LOVENOX) injection  40 mg Subcutaneous Q24H  . fluticasone  1 spray Each Nare Daily  . furosemide  40 mg Intravenous BID  . mometasone-formoterol  2 puff Inhalation BID  . nitroGLYCERIN  1  inch Topical Once  . potassium chloride  40 mEq Oral BID  . sodium chloride  3 mL Intravenous Q12H  . spironolactone  12.5 mg Oral Daily  . tiotropium  18 mcg Inhalation Daily    Infusions:    PRN Medications: albuterol, guaiFENesin-dextromethorphan, zolpidem   Assessment:   1. PAH with acute right heart failure 2. Pericardial effusion 3. Acute respiratory failure 4. COPD  Plan/Discussion:    Volume overload improving. Creatinine up slightly. Will continue IV lasix as she is still overloaded. Place TED hose. Electrolytes ok     Chest CT images reviewed personally. Results as above. Etiology of PAH and RV failure remains unclear. Group 3 is leading suspect but she has progressed rapidly since 2015 and I suspect PAH may be out of proportion to COPD. She has diffuse CAD on CT. RV infarct also a possibility.   Check PFTs with DLCO. Auto-immune and infectious serologies negative to date  Plan R/L heart cath on Monday. We discussed procedure again today.     Length of Stay: 4   [name] MD 10/06/2015, 12:48 PM  Advanced Heart Failure Team Pager [phone] (M-F; [place])  Please contact [facility] for night-coverage after hours (4p -7a ) and weekends on [email or web address]

## 2015-10-07 DIAGNOSIS — J9621 Acute and chronic respiratory failure with hypoxia: Secondary | ICD-10-CM

## 2015-10-07 LAB — BASIC METABOLIC PANEL
Anion gap: 6 (ref 5–15)
BUN: 22 mg/dL — AB (ref 6–20)
CO2: 35 mmol/L — ABNORMAL HIGH (ref 22–32)
Calcium: 9 mg/dL (ref 8.9–10.3)
Chloride: 99 mmol/L — ABNORMAL LOW (ref 101–111)
Creatinine, Ser: 1.26 mg/dL — ABNORMAL HIGH (ref 0.44–1.00)
GFR calc Af Amer: 50 mL/min — ABNORMAL LOW (ref >60)
GFR, EST NON AFRICAN AMERICAN: 43 mL/min — AB (ref >60)
GLUCOSE: 89 mg/dL (ref 65–99)
POTASSIUM: 5 mmol/L (ref 3.5–5.1)
Sodium: 140 mmol/L (ref 135–145)

## 2015-10-07 LAB — HEPATIC FUNCTION PANEL
ALT: 19 U/L (ref 14–54)
AST: 29 U/L (ref 15–41)
Albumin: 3.2 g/dL — ABNORMAL LOW (ref 3.5–5.0)
Alkaline Phosphatase: 85 U/L (ref 38–126)
BILIRUBIN DIRECT: 0.6 mg/dL — AB (ref 0.1–0.5)
BILIRUBIN INDIRECT: 1.4 mg/dL — AB (ref 0.3–0.9)
Total Bilirubin: 2 mg/dL — ABNORMAL HIGH (ref 0.3–1.2)
Total Protein: 5.7 g/dL — ABNORMAL LOW (ref 6.5–8.1)

## 2015-10-07 LAB — CBC
HCT: 49.4 % — ABNORMAL HIGH (ref 36.0–46.0)
HEMOGLOBIN: 15.4 g/dL — AB (ref 12.0–15.0)
MCH: 32 pg (ref 26.0–34.0)
MCHC: 31.2 g/dL (ref 30.0–36.0)
MCV: 102.7 fL — ABNORMAL HIGH (ref 78.0–100.0)
PLATELETS: 179 10*3/uL (ref 150–400)
RBC: 4.81 MIL/uL (ref 3.87–5.11)
RDW: 16.5 % — AB (ref 11.5–15.5)
WBC: 5.6 10*3/uL (ref 4.0–10.5)

## 2015-10-07 LAB — CYCLIC CITRUL PEPTIDE ANTIBODY, IGG/IGA: CCP ANTIBODIES IGG/IGA: 10 U (ref 0–19)

## 2015-10-07 MED ORDER — ASPIRIN 81 MG PO CHEW
81.0000 mg | CHEWABLE_TABLET | ORAL | Status: AC
  Administered 2015-10-08: 81 mg via ORAL

## 2015-10-07 MED ORDER — SODIUM CHLORIDE 0.9% FLUSH: 3.0000 mL | INTRAVENOUS | Status: DC | PRN

## 2015-10-07 MED ORDER — DIPHENHYDRAMINE HCL 25 MG PO CAPS
25.0000 mg | ORAL_CAPSULE | Freq: Once | ORAL | Status: AC
  Administered 2015-10-07: 25 mg via ORAL
  Filled 2015-10-07: qty 1

## 2015-10-07 MED ORDER — SODIUM CHLORIDE 0.9 % IV SOLN
INTRAVENOUS | Status: DC
  Administered 2015-10-08: 06:00:00 via INTRAVENOUS

## 2015-10-07 MED ORDER — SODIUM CHLORIDE 0.9 % IV SOLN: 250.0000 mL | INTRAVENOUS | Status: DC | PRN

## 2015-10-07 MED ORDER — SODIUM CHLORIDE 0.9% FLUSH
3.0000 mL | Freq: Two times a day (BID) | INTRAVENOUS | Status: DC
  Administered 2015-10-08: 3 mL via INTRAVENOUS

## 2015-10-07 MED ORDER — LORAZEPAM 0.5 MG PO TABS
0.5000 mg | ORAL_TABLET | Freq: Once | ORAL | Status: AC
  Administered 2015-10-07: 0.5 mg via ORAL
  Filled 2015-10-07: qty 1

## 2015-10-07 NOTE — Progress Notes (Signed)
[facility] PROGRESS NOTE  Linda Stevens NWG:[id number] DOB: 08-Nov-1945 DOA: 10/01/2015 PCP: Arnette Norris, MD  70 year old female with history of hypertension, COPD and asthma, history of diastolic dysfunction presented to the ED due to progressively worsening dyspnea with abdominal and lower extremity swelling of the past few weeks. Her O2 sats were in the low 80s on room air. She also reported orthopnea and PND for the past 2 days. Workup in the ED showed hypokalemia and elevated BNP of 1159. Chest x-ray showed pulmonary vascular prominence.  HPI/Subjective: Pedal edema has improved. Breathing well at this time. No cough.   Assessment/Plan: Acute hypoxic respiratory failure/ right heart failure - Secondary to severe pulmonary artery hypertension.  - 2-D echo done showing grade 1 diastolic dysfunction and severely elevated pulmonary artery pressure of 87 mmHg. - CT chest mod pericardial effusion +/- pericardial thickening, possible cirrhosis and changes consistent with COPD -Doppler lower extremity negative for DVT  -Cardiology consulted for right heart catheterization. - pulmonary following as well- auto-immune labs sent for [facility]- state she will need chronic anticoagulation - has diuresed about 6 L - diuretics per cardiology   Cirrhosis?  - noted on CT- obtain Hepatitis - possible cardiac cirrhosis  COPD - No exacerbation -  Spiriva, Dulera, albuterol nebulizer as needed.    Hypotension HCTZ held. Continue Lasix with close monitoring.  DVT prophylaxis: Subcutaneous Lovenox Diet: Heart healthy Code Status: Full code Disposition Plan: home when stable Consultants:  Pulmonary  Cardiology Procedures:  2-D echo  Doppler lower extremity Antibiotics: None   Objective: Filed Vitals:   10/07/15 1013 10/07/15 1101  BP:  109/59  Pulse: 81 86  Temp:  97.7 F (36.5 C)  Resp: 16 16    Intake/Output Summary (Last 24 hours) at 10/07/15 1127 Last data filed at  10/07/15 1000  Gross per 24 hour  Intake   1165 ml  Output   3600 ml  Net  -2435 ml   Filed Weights   10/05/15 0435 10/06/15 0440 10/07/15 0357  Weight: 74.98 kg (165 lb 4.8 oz) 72.984 kg (160 lb 14.4 oz) 70.716 kg (155 lb 14.4 oz)    Exam:   General: Not in distress  HEENT: Moist mucosa  Resp: Diminished bibasilar breath sounds  CVS: Normal S1 and S2, no murmurs  GI: no distension, BS +, non-tender  Musculoskeletal: 1+ pitting edema bilaterally    Data Reviewed: Basic Metabolic Panel:  Recent Labs Lab 10/02/15 0009 10/02/15 0740 10/03/15 0400 10/04/15 0408 10/06/15 0315 10/07/15 0340  NA  --  146* 144 145 143 140  K  --  3.5 3.6 4.3 4.9 5.0  CL  --  98* 99* 99* 96* 99*  CO2  --  34* 33* 35* 36* 35*  GLUCOSE  --  94 102* 95 98 89  BUN  --  _0 21* 22*  CREATININE  --  1.13* 1.12* 1.14* 1.23* 1.26*  CALCIUM  --  9.4 8.7* 8.8* 9.3 9.0  MG 1.9  --   --   --   --   --   PHOS 4.3  --   --   --   --   --    Liver Function Tests:  Recent Labs Lab 10/01/15 1925  AST 26  ALT 14  ALKPHOS 85  BILITOT 1.9*  PROT 6.3*  ALBUMIN 3.7   No results for input(s): LIPASE, AMYLASE in the last 168 hours. No results for input(s): AMMONIA in the last 168 hours. CBC:  Recent Labs Lab 10/01/15 1925 10/03/15 0400  WBC 6.0 4.9  HGB 16.7* 15.0  HCT 52.0* 48.4*  MCV 103.2* 105.4*  PLT 188 193   Cardiac Enzymes:  Recent Labs Lab 10/02/15 0132 10/02/15 0740  TROPONINI <0.03 <0.03   BNP (last 3 results) No results for input(s): BNP in the last 8760 hours.  ProBNP (last 3 results)  Recent Labs  10/01/15 1531  PROBNP 1159.0*    CBG: No results for input(s): GLUCAP in the last 168 hours.  No results found for this or any previous visit (from the past 240 hour(s)).   Studies: No results found.  Scheduled Meds: . aspirin EC  81 mg Oral Daily  . enoxaparin (LOVENOX) injection  40 mg Subcutaneous Q24H  . fluticasone  1 spray Each Nare Daily  .  furosemide  40 mg Intravenous BID  . mometasone-formoterol  2 puff Inhalation BID  . nitroGLYCERIN  1 inch Topical Once  . potassium chloride  40 mEq Oral BID  . sodium chloride  3 mL Intravenous Q12H  . spironolactone  12.5 mg Oral Daily  . tiotropium  18 mcg Inhalation Daily   Continuous Infusions:      Time spent: 25 MINUTES    [facility]  [facility] Pager [phone] If 7PM-7AM, please contact night-coverage at [email or web address], password [facility] 10/07/2015, 11:27 AM  LOS: 5 days

## 2015-10-07 NOTE — Progress Notes (Signed)
Notified by patient that she was feeling SOB after waking with leg cramps.  When assessing SpO2, patient was 92-93% on 1L nasal cannula.  Encouraged deep breathing and increased oxygen to 1.5L. SpO2 increased to 95-96%. Patient currently denying any further SOB or leg cramps. Will continue to monitor. Tresa Endo

## 2015-10-07 NOTE — Progress Notes (Signed)
Advanced Heart Failure Rounding Note   Subjective:    VQ scan low prob. CT chest mild COPD. Moderate pericardial effusion. 3v CAD. + cirrhosis. Serology negative so far.   PFTs pending (FEV1 1.23 (52%) with FVC 2.01 (65%) and DLCO 51% in 7/15)   Diuresing well on IV lasix. Weight down another 5 pounds. Feels that she is getting her legs back again. Breathing better. Renal function stable.    Objective:   Weight Range:  Vital Signs:   Temp:  [97.7 F (36.5 C)-98.4 F (36.9 C)] 97.7 F (36.5 C) (01/29 1101) Pulse Rate:  [81-99] 86 (01/29 1101) Resp:  [16-20] 16 (01/29 1101) BP: (93-109)/(48-59) 109/59 mmHg (01/29 1101) SpO2:  [93 %-95 %] 95 % (01/29 1101) Weight:  [70.716 kg (155 lb 14.4 oz)] 70.716 kg (155 lb 14.4 oz) (01/29 0357) Last BM Date: 10/06/15  Weight change: Filed Weights   10/05/15 0435 10/06/15 0440 10/07/15 0357  Weight: 74.98 kg (165 lb 4.8 oz) 72.984 kg (160 lb 14.4 oz) 70.716 kg (155 lb 14.4 oz)    Intake/Output:   Intake/Output Summary (Last 24 hours) at 10/07/15 1239 Last data filed at 10/07/15 1100  Gross per 24 hour  Intake   1165 ml  Output   3750 ml  Net  -2585 ml     Physical Exam: General: Pleasant, NAD Psych: Normal affect. Neuro: Alert and oriented X 3. Moves all extremities spontaneously. HEENT: Normal Neck: Supple without bruits. JVP ~ 8-9 Lungs: Resp regular and unlabored, decreased BS throughout  Heart: Distant HS RRR no s3, s4, or murmurs. +RV lift Abdomen: Soft, non-tender, non-distended, BS + x 4.  Extremities: No clubbing, cyanosis. 1+ bilat LE edema into thighs. DP/PT/Radials 2  Telemetry: NSR 80s  Labs: Basic Metabolic Panel:  Recent Labs Lab 10/02/15 0009 10/02/15 0740 10/03/15 0400 10/04/15 0408 10/06/15 0315 10/07/15 0340  NA  --  146* 144 145 143 140  K  --  3.5 3.6 4.3 4.9 5.0  CL  --  98* 99* 99* 96* 99*  CO2  --  34* 33* 35* 36* 35*  GLUCOSE  --  94 102* 95 98 89  BUN  --  _0 21* 22*  CREATININE  --  1.13* 1.12* 1.14* 1.23* 1.26*  CALCIUM  --  9.4 8.7* 8.8* 9.3 9.0  MG 1.9  --   --   --   --   --   PHOS 4.3  --   --   --   --   --     Liver Function Tests:  Recent Labs Lab 10/01/15 1925  AST 26  ALT 14  ALKPHOS 85  BILITOT 1.9*  PROT 6.3*  ALBUMIN 3.7   No results for input(s): LIPASE, AMYLASE in the last 168 hours. No results for input(s): AMMONIA in the last 168 hours.  CBC:  Recent Labs Lab 10/01/15 1925 10/03/15 0400  WBC 6.0 4.9  HGB 16.7* 15.0  HCT 52.0* 48.4*  MCV 103.2* 105.4*  PLT 188 193    Cardiac Enzymes:  Recent Labs Lab 10/02/15 0132 10/02/15 0740  TROPONINI <0.03 <0.03    BNP: BNP (last 3 results) No results for input(s): BNP in the last 8760 hours.  ProBNP (last 3 results)  Recent Labs  10/01/15 1531  PROBNP 1159.0*      Other results:  Imaging: No results found.   Medications:     Scheduled Medications: . aspirin EC  81 mg Oral Daily  .  enoxaparin (LOVENOX) injection  40 mg Subcutaneous Q24H  . fluticasone  1 spray Each Nare Daily  . furosemide  40 mg Intravenous BID  . mometasone-formoterol  2 puff Inhalation BID  . nitroGLYCERIN  1 inch Topical Once  . potassium chloride  40 mEq Oral BID  . sodium chloride  3 mL Intravenous Q12H  . spironolactone  12.5 mg Oral Daily  . tiotropium  18 mcg Inhalation Daily    Infusions:    PRN Medications: albuterol, guaiFENesin-dextromethorphan   Assessment:   1. PAH with acute right heart failure 2. Pericardial effusion 3. Acute respiratory failure 4. COPD 5. Polycythemia suspect due to chronic hypoxia 6. Hyperkalemia  Plan/Discussion:    Volume overload improving. Creatinine stable. Will continue IV lasix one more day. Continue TED hose. K up. Stop potassium supplements.   Chest CT images reviewed personally. Results as above. Etiology of PAH and RV failure remains unclear. Group 3 is leading suspect but she has progressed rapidly since  2015 and I suspect PAH may be out of proportion to COPD. She has diffuse CAD on CT. RV infarct also a possibility.   Check PFTs with DLCO. Auto-immune and infectious serologies negative to date  Plan R/L heart cath tomorrow. Orders written. Will need home O2.     Length of Stay: 5   Geroge Gilliam MD 10/07/2015, 12:39 PM  Advanced Heart Failure Team Pager [phone] (M-F; [place])  Please contact [facility] for night-coverage after hours (4p -7a ) and weekends on [email or web address]

## 2015-10-08 ENCOUNTER — Encounter (HOSPITAL_COMMUNITY)
Admission: EM | Disposition: A | Discharge status: Home / Self Care | Payer: Self-pay | Source: Home / Self Care | Attending: Internal Medicine

## 2015-10-08 ENCOUNTER — Encounter (HOSPITAL_COMMUNITY): Payer: Self-pay | Admitting: Internal Medicine

## 2015-10-08 ENCOUNTER — Inpatient Hospital Stay (HOSPITAL_COMMUNITY): Payer: Medicare HMO

## 2015-10-08 DIAGNOSIS — J432 Centrilobular emphysema: Secondary | ICD-10-CM

## 2015-10-08 HISTORY — PX: CARDIAC CATHETERIZATION: SHX172

## 2015-10-08 LAB — BASIC METABOLIC PANEL
Anion gap: 13 (ref 5–15)
BUN: 23 mg/dL — AB (ref 6–20)
CALCIUM: 9.7 mg/dL (ref 8.9–10.3)
CHLORIDE: 97 mmol/L — AB (ref 101–111)
CO2: 35 mmol/L — AB (ref 22–32)
CREATININE: 1.34 mg/dL — AB (ref 0.44–1.00)
GFR calc non Af Amer: 40 mL/min — ABNORMAL LOW (ref >60)
GFR, EST AFRICAN AMERICAN: 46 mL/min — AB (ref >60)
GLUCOSE: 97 mg/dL (ref 65–99)
Potassium: 4.4 mmol/L (ref 3.5–5.1)
Sodium: 145 mmol/L (ref 135–145)

## 2015-10-08 LAB — PULMONARY FUNCTION TEST
DL/VA % pred: 63 %
DL/VA: 2.99 ml/min/mmHg/L
DLCO unc % pred: 38 %
DLCO unc: 8.8 ml/min/mmHg
FEF 25-75 Post: 0.23 L/sec
FEF 25-75 Pre: 0.31 L/sec
FEF2575-%CHANGE-POST: -26 %
FEF2575-%PRED-PRE: 15 %
FEF2575-%Pred-Post: 11 %
FEV1-%Change-Post: -13 %
FEV1-%PRED-POST: 35 %
FEV1-%PRED-PRE: 40 %
FEV1-PRE: 0.91 L
FEV1-Post: 0.79 L
FEV1FVC-%CHANGE-POST: -11 %
FEV1FVC-%PRED-PRE: 73 %
FEV6-%Change-Post: -7 %
FEV6-%PRED-PRE: 53 %
FEV6-%Pred-Post: 49 %
FEV6-Post: 1.41 L
FEV6-Pre: 1.53 L
FEV6FVC-%Change-Post: -5 %
FEV6FVC-%PRED-POST: 91 %
FEV6FVC-%PRED-PRE: 96 %
FVC-%CHANGE-POST: -2 %
FVC-%PRED-PRE: 55 %
FVC-%Pred-Post: 54 %
FVC-POST: 1.6 L
FVC-PRE: 1.64 L
POST FEV6/FVC RATIO: 88 %
PRE FEV1/FVC RATIO: 56 %
PRE FEV6/FVC RATIO: 93 %
Post FEV1/FVC ratio: 49 %
RV % PRED: 137 %
RV: 2.87 L
TLC % pred: 88 %
TLC: 4.32 L

## 2015-10-08 LAB — POCT I-STAT 3, VENOUS BLOOD GAS (G3P V)
ACID-BASE EXCESS: 11 mmol/L — AB (ref 0.0–2.0)
Acid-Base Excess: 10 mmol/L — ABNORMAL HIGH (ref 0.0–2.0)
Bicarbonate: 36.2 mEq/L — ABNORMAL HIGH (ref 20.0–24.0)
Bicarbonate: 37.4 mEq/L — ABNORMAL HIGH (ref 20.0–24.0)
O2 SAT: 66 %
O2 SAT: 69 %
PCO2 VEN: 53.1 mmHg — AB (ref 45.0–50.0)
PH VEN: 7.445 — AB (ref 7.250–7.300)
TCO2: 38 mmol/L (ref 0–100)
TCO2: 39 mmol/L (ref 0–100)
pCO2, Ven: 54.5 mmHg — ABNORMAL HIGH (ref 45.0–50.0)
pH, Ven: 7.442 — ABNORMAL HIGH (ref 7.250–7.300)
pO2, Ven: 34 mmHg (ref 30.0–45.0)
pO2, Ven: 35 mmHg (ref 30.0–45.0)

## 2015-10-08 LAB — POCT I-STAT 3, ART BLOOD GAS (G3+)
ACID-BASE EXCESS: 9 mmol/L — AB (ref 0.0–2.0)
BICARBONATE: 34.8 meq/L — AB (ref 20.0–24.0)
O2 Saturation: 93 %
PH ART: 7.446 (ref 7.350–7.450)
TCO2: 36 mmol/L (ref 0–100)
pCO2 arterial: 50.6 mmHg — ABNORMAL HIGH (ref 35.0–45.0)
pO2, Arterial: 65 mmHg — ABNORMAL LOW (ref 80.0–100.0)

## 2015-10-08 LAB — CBC
HCT: 51.6 % — ABNORMAL HIGH (ref 36.0–46.0)
Hemoglobin: 16.7 g/dL — ABNORMAL HIGH (ref 12.0–15.0)
MCH: 33.1 pg (ref 26.0–34.0)
MCHC: 32.4 g/dL (ref 30.0–36.0)
MCV: 102.4 fL — AB (ref 78.0–100.0)
Platelets: 172 10*3/uL (ref 150–400)
RBC: 5.04 MIL/uL (ref 3.87–5.11)
RDW: 16.7 % — AB (ref 11.5–15.5)
WBC: 5.6 10*3/uL (ref 4.0–10.5)

## 2015-10-08 LAB — ANTINUCLEAR ANTIBODIES, IFA: ANA Ab, IFA: POSITIVE — AB

## 2015-10-08 LAB — FANA STAINING PATTERNS: Speckled Pattern: 1:1280 {titer}

## 2015-10-08 LAB — PROTIME-INR
INR: 1.11 (ref 0.00–1.49)
PROTHROMBIN TIME: 14.5 s (ref 11.6–15.2)

## 2015-10-08 SURGERY — RIGHT/LEFT HEART CATH AND CORONARY ANGIOGRAPHY
Anesthesia: LOCAL

## 2015-10-08 MED ORDER — ENOXAPARIN SODIUM 40 MG/0.4ML ~~LOC~~ SOLN
40.0000 mg | SUBCUTANEOUS | Status: DC
  Administered 2015-10-09: 40 mg via SUBCUTANEOUS
  Filled 2015-10-08: qty 0.4

## 2015-10-08 MED ORDER — IOHEXOL 350 MG/ML SOLN
INTRAVENOUS | Status: DC | PRN
  Administered 2015-10-08: 60 mL via INTRA_ARTERIAL

## 2015-10-08 MED ORDER — SODIUM CHLORIDE 0.9 % IV SOLN: 250.0000 mL | INTRAVENOUS | Status: DC | PRN

## 2015-10-08 MED ORDER — LIDOCAINE HCL (PF) 1 % IJ SOLN
INTRAMUSCULAR | Status: AC
  Filled 2015-10-08: qty 30

## 2015-10-08 MED ORDER — HEPARIN (PORCINE) IN NACL 2-0.9 UNIT/ML-% IJ SOLN
INTRAMUSCULAR | Status: DC | PRN
  Administered 2015-10-08: 13:00:00

## 2015-10-08 MED ORDER — ACETAMINOPHEN 325 MG PO TABS: 650.0000 mg | ORAL_TABLET | ORAL | Status: DC | PRN

## 2015-10-08 MED ORDER — HEPARIN (PORCINE) IN NACL 2-0.9 UNIT/ML-% IJ SOLN
INTRAMUSCULAR | Status: AC
  Filled 2015-10-08: qty 1500

## 2015-10-08 MED ORDER — LORAZEPAM 1 MG PO TABS
1.0000 mg | ORAL_TABLET | Freq: Once | ORAL | Status: AC
  Administered 2015-10-08: 1 mg via ORAL
  Filled 2015-10-08: qty 1

## 2015-10-08 MED ORDER — MIDAZOLAM HCL 2 MG/2ML IJ SOLN
INTRAMUSCULAR | Status: AC
  Filled 2015-10-08: qty 2

## 2015-10-08 MED ORDER — ONDANSETRON HCL 4 MG/2ML IJ SOLN: 4.0000 mg | Freq: Four times a day (QID) | INTRAMUSCULAR | Status: DC | PRN

## 2015-10-08 MED ORDER — MIDAZOLAM HCL 2 MG/2ML IJ SOLN
INTRAMUSCULAR | Status: DC | PRN
  Administered 2015-10-08 (×2): 1 mg via INTRAVENOUS

## 2015-10-08 MED ORDER — SODIUM CHLORIDE 0.9% FLUSH: 3.0000 mL | INTRAVENOUS | Status: DC | PRN

## 2015-10-08 MED ORDER — SILDENAFIL CITRATE 20 MG PO TABS
20.0000 mg | ORAL_TABLET | Freq: Three times a day (TID) | ORAL | Status: DC
  Administered 2015-10-08 – 2015-10-09 (×3): 20 mg via ORAL
  Filled 2015-10-08 (×3): qty 1

## 2015-10-08 MED ORDER — ALBUTEROL SULFATE (2.5 MG/3ML) 0.083% IN NEBU
2.5000 mg | INHALATION_SOLUTION | Freq: Once | RESPIRATORY_TRACT | Status: AC
Start: 2015-10-08 — End: 2015-10-08
  Administered 2015-10-08: 2.5 mg via RESPIRATORY_TRACT

## 2015-10-08 MED ORDER — SODIUM CHLORIDE 0.9 % IV SOLN: INTRAVENOUS | Status: AC

## 2015-10-08 MED ORDER — SODIUM CHLORIDE 0.9% FLUSH
3.0000 mL | Freq: Two times a day (BID) | INTRAVENOUS | Status: DC
Start: 2015-10-08 — End: 2015-10-09
  Administered 2015-10-08 – 2015-10-09 (×2): 3 mL via INTRAVENOUS

## 2015-10-08 MED ORDER — FENTANYL CITRATE (PF) 100 MCG/2ML IJ SOLN
INTRAMUSCULAR | Status: AC
  Filled 2015-10-08: qty 2

## 2015-10-08 MED ORDER — LIDOCAINE HCL (PF) 1 % IJ SOLN
INTRAMUSCULAR | Status: DC | PRN
  Administered 2015-10-08: 31 mL

## 2015-10-08 SURGICAL SUPPLY — 17 items
CATH INFINITI 5FR ANG PIGTAIL (CATHETERS) ×2 IMPLANT
CATH INFINITI 5FR JL4 (CATHETERS) ×1 IMPLANT
CATH INFINITI JR4 5F (CATHETERS) ×2 IMPLANT
CATH SWAN GANZ 7F STRAIGHT (CATHETERS) ×1 IMPLANT
COVER PRB 48X5XTLSCP FOLD TPE (BAG) IMPLANT
COVER PROBE 5X48 (BAG) ×2
KIT HEART LEFT (KITS) ×2 IMPLANT
KIT HEART RIGHT NAMIC (KITS) ×2 IMPLANT
PACK CARDIAC CATHETERIZATION (CUSTOM PROCEDURE TRAY) ×2 IMPLANT
SHEATH PINNACLE 5F 10CM (SHEATH) ×1 IMPLANT
SHEATH PINNACLE 7F 10CM (SHEATH) ×1 IMPLANT
SYR MEDRAD MARK V 150ML (SYRINGE) ×2 IMPLANT
TRANSDUCER W/STOPCOCK (MISCELLANEOUS) ×4 IMPLANT
TUBING CIL FLEX 10 FLL-RA (TUBING) ×2 IMPLANT
WIRE EMERALD 3MM-J .035X150CM (WIRE) ×1 IMPLANT
WIRE HI TORQ VERSACORE-J 145CM (WIRE) ×1 IMPLANT
WIRE SAFE-T 1.5MM-J .035X260CM (WIRE) ×1 IMPLANT

## 2015-10-08 NOTE — Interval H&P Note (Signed)
History and Physical Interval Note:  10/08/2015 11:16 AM  Linda Stevens  has presented today for surgery, with the diagnosis of HF  The various methods of treatment have been discussed with the patient and family. After consideration of risks, benefits and other options for treatment, the patient has consented to  Procedure(s): Right/Left Heart Cath and Coronary Angiography (N/A) and possible angioplasty as a surgical intervention .  The patient's history has been reviewed, patient examined, no change in status, stable for surgery.  I have reviewed the patient's chart and labs.  Questions were answered to the patient's satisfaction.     Danyla Wattley, Quillian Quince

## 2015-10-08 NOTE — Progress Notes (Signed)
PCCM PROGRESS NOTE  ADMISSION DATE: 10/01/2015 CONSULT DATE: 10/03/2015 REFERRING PROVIDER: Triad  CC: Short of breath  SUBJECTIVE: She feels congested in her nose and upper chest.  VITAL SIGNS: BP 104/61 mmHg  Pulse 86  Temp(Src) 98.2 F (36.8 C) (Oral)  Resp 16  Ht _0  (1.575 m)  Wt 149 lb 6.4 oz (67.767 kg)  BMI 27.32 kg/m2  SpO2 98%  INTAKE/OUTPUT: I/O last 3 completed shifts: In: 5093 [P.O.:1142; I.V.:3] Out: 4250 [Urine:4250]  General: pleasant HEENT: no sinus tenderness Cardiac: regular, no murmur Chest: no wheeze Abd: soft, non tender Ext: no edema, no joint swelling Neuro: normal strength Skin: no rashes   CBC Recent Labs     10/07/15  1420  10/08/15  0930  WBC  5.6  5.6  HGB  15.4*  16.7*  HCT  49.4*  51.6*  PLT  179  172    Coag's Recent Labs     10/08/15  0930  INR  1.11    BMET Recent Labs     10/06/15  0315  10/07/15  0340  10/08/15  0930  NA  143  140  145  K  4.9  5.0  4.4  CL  96*  99*  97*  CO2  36*  35*  35*  BUN  21*  22*  23*  CREATININE  1.23*  1.26*  1.34*  GLUCOSE  98  89  97    Electrolytes Recent Labs     10/06/15  0315  10/07/15  0340  10/08/15  0930  CALCIUM  9.3  9.0  9.7    Liver Enzymes Recent Labs     10/07/15  0940  AST  29  ALT  19  ALKPHOS  85  BILITOT  2.0*  ALBUMIN  3.2*    Imaging No results found.   STUDIES: 1/24 Echo >> EF 26%, grade 1 diastolic dysfx, severe RV dilation, PAS 87 mmHg, mod pericardial effusion 1/26 Doppler legs >> negative 1/26 V/Q scan >> low probability for PE 1/27 CT chest >> centrilobular emphysema, changes of cirrhosis, atherosclerosis, pericardial effusion, small Rt pleural effusion 1/27 Serology >> RF 99.8, Anti CCP 10, Anti Jo < 0.2, SS A/B negative, Scl 70 negative,  1/30 PFT >> FEV1 0.91 (40%), FEV1% 56, TLC 4.32 (88%), DLCO 38%, no BD  EVENTS: 1/23 Admit 1/26 Cardiology consulted  DISCUSSION: 70 yo female former smoker presented with dyspnea and  edema with hypoxia.  Concern was for CHF exacerbation.  Echo findings consistent with cor pulmonale and severe pulmonary HTN.  Her PFT this admission show severe obstruction and diffusion defect.  She has hx of HTN, diastolic CHF, COPD, allergies.  Suspect she has chronic hypoxic respiratory failure in setting of severe COPD as main contributor to pulmonary hypertension.  Also has some degree of diastolic CHF.    She has mild elevation in rheumatoid factor, but no other findings to suggest rheumatoid arthritis >> doubt this is significant.  She has changes of cirrhosis noted on CT chest.  ASSESSMENT/PLAN:  Severe COPD with emphysema. - continue spiriva, dulera, prn albuterol  Acute on chronic hypoxic respiratory >> elevated HCO3 on BMET suggests chronic hypercapnia. - adjust oxygen to keep SpO2 > 92%  Changes of cirrhosis on CT chest. - per primary team  Chronic diastolic CHF. Pericardial effusion. - per primary team and cardiology  Pulmonary hypertension >> likely related to COPD/emphysema, and chronic respiratory failure. - for Rt heart cath per cardiology  Polycythemia >>  likely related to chronic hypoxic respiratory failure. - f/u CBC  Elevated rheumatoid factor >> ? Significance. - monitor clinically for evidence of rheumatoid arthritis  Updated pt's family at bedside.  Chesley Mires, MD [facility] Pulmonary/Critical Care 10/08/2015, 10:54 AM Pager:  [phone] After 3pm call: [phone]

## 2015-10-08 NOTE — Progress Notes (Signed)
Advanced Heart Failure Rounding Note   Subjective:    VQ scan low prob. CT chest mild COPD. Moderate pericardial effusion. 3v CAD. + cirrhosis. Serology negative so far.   PFTs pending (FEV1 1.23 (52%) with FVC 2.01 (65%) and DLCO 51% in 7/15)   Diuresing well on IV lasix. Weight down another 6 pounds. Feels that she is getting her legs back again. Breathing better. BMET pending. No dizziness.     Objective:   Weight Range:  Vital Signs:   Temp:  [97.7 F (36.5 C)-98.7 F (37.1 C)] 98.2 F (36.8 C) (01/30 0943) Pulse Rate:  [81-87] 86 (01/30 0943) Resp:  [16-17] 16 (01/30 0556) BP: (97-111)/(59-61) 104/61 mmHg (01/30 0943) SpO2:  [90 %-98 %] 98 % (01/30 0943) Weight:  [67.767 kg (149 lb 6.4 oz)] 67.767 kg (149 lb 6.4 oz) (01/30 0613) Last BM Date: 10/07/15  Weight change: Filed Weights   10/06/15 0440 10/07/15 0357 10/08/15 0613  Weight: 72.984 kg (160 lb 14.4 oz) 70.716 kg (155 lb 14.4 oz) 67.767 kg (149 lb 6.4 oz)    Intake/Output:   Intake/Output Summary (Last 24 hours) at 10/08/15 0949 Last data filed at 10/08/15 0846  Gross per 24 hour  Intake    223 ml  Output   2350 ml  Net  -2127 ml     Physical Exam: General: Pleasant, NAD Psych: Normal affect. Neuro: Alert and oriented X 3. Moves all extremities spontaneously. HEENT: Normal Neck: Supple without bruits. JVP ~ 7 Lungs: Resp regular and unlabored, decreased BS throughout  Heart: Distant HS RRR no s3, s4, or murmurs. +RV lift Abdomen: Soft, non-tender, non-distended, BS + x 4.  Extremities: No clubbing, cyanosis. tr-1+ bilat LE edema. TEDs DP/PT/Radials 2  Telemetry: NSR 80s  Labs: Basic Metabolic Panel:  Recent Labs Lab 10/02/15 0009 10/02/15 0740 10/03/15 0400 10/04/15 0408 10/06/15 0315 10/07/15 0340  NA  --  146* 144 145 143 140  K  --  3.5 3.6 4.3 4.9 5.0  CL  --  98* 99* 99* 96* 99*  CO2  --  34* 33* 35* 36* 35*  GLUCOSE  --  94 102* 95 98 89  BUN  --  _0 21* 22*  CREATININE  --  1.13* 1.12* 1.14* 1.23* 1.26*  CALCIUM  --  9.4 8.7* 8.8* 9.3 9.0  MG 1.9  --   --   --   --   --   PHOS 4.3  --   --   --   --   --     Liver Function Tests:  Recent Labs Lab 10/01/15 1925 10/07/15 0940  AST 26 29  ALT 14 19  ALKPHOS 85 85  BILITOT 1.9* 2.0*  PROT 6.3* 5.7*  ALBUMIN 3.7 3.2*   No results for input(s): LIPASE, AMYLASE in the last 168 hours. No results for input(s): AMMONIA in the last 168 hours.  CBC:  Recent Labs Lab 10/01/15 1925 10/03/15 0400 10/07/15 1420  WBC 6.0 4.9 5.6  HGB 16.7* 15.0 15.4*  HCT 52.0* 48.4* 49.4*  MCV 103.2* 105.4* 102.7*  PLT 188 193 179    Cardiac Enzymes:  Recent Labs Lab 10/02/15 0132 10/02/15 0740  TROPONINI <0.03 <0.03    BNP: BNP (last 3 results) No results for input(s): BNP in the last 8760 hours.  ProBNP (last 3 results)  Recent Labs  10/01/15 1531  PROBNP 1159.0*      Other results:  Imaging: No results found.  Medications:     Scheduled Medications: . aspirin EC  81 mg Oral Daily  . enoxaparin (LOVENOX) injection  40 mg Subcutaneous Q24H  . fluticasone  1 spray Each Nare Daily  . furosemide  40 mg Intravenous BID  . mometasone-formoterol  2 puff Inhalation BID  . nitroGLYCERIN  1 inch Topical Once  . sodium chloride  3 mL Intravenous Q12H  . sodium chloride flush  3 mL Intravenous Q12H  . spironolactone  12.5 mg Oral Daily  . tiotropium  18 mcg Inhalation Daily    Infusions: . sodium chloride 10 mL/hr at 10/08/15 0600    PRN Medications: sodium chloride, albuterol, guaiFENesin-dextromethorphan, sodium chloride flush   Assessment:   1. PAH with acute right heart failure 2. Pericardial effusion 3. Acute respiratory failure 4. COPD 5. Polycythemia suspect due to chronic hypoxia 6. Hyperkalemia  Plan/Discussion:    Volume overload improving weight down 19 pounds . BMET pending. Hold lasix for now. Continue TED hose. R/L heart cath  today  Chest CT images reviewed personally. Results as above. Etiology of PAH and RV failure remains unclear. Group 3 is leading suspect but she has progressed rapidly since 2015 and I suspect PAH may be out of proportion to COPD. She has diffuse CAD on CT. RV infarct also a possibility.   Check PFTs with DLCO. Auto-immune and infectious serologies negative to date  Length of Stay: 6   Bensimhon, Daniel MD 10/08/2015, 9:49 AM  Advanced Heart Failure Team Pager [phone] (M-F; [place])  Please contact [facility] for night-coverage after hours (4p -7a ) and weekends on [email or web address]

## 2015-10-08 NOTE — Progress Notes (Signed)
Pt alert and oriented X4 waiting for cath procedure.

## 2015-10-08 NOTE — H&P (View-Only) (Signed)
Advanced Heart Failure Rounding Note   Subjective:    VQ scan low prob. CT chest mild COPD. Moderate pericardial effusion. 3v CAD. + cirrhosis. Serology negative so far.   PFTs pending (FEV1 1.23 (52%) with FVC 2.01 (65%) and DLCO 51% in 7/15)   Diuresing well on IV lasix. Weight down another 6 pounds. Feels that she is getting her legs back again. Breathing better. BMET pending. No dizziness.     Objective:   Weight Range:  Vital Signs:   Temp:  [97.7 F (36.5 C)-98.7 F (37.1 C)] 98.2 F (36.8 C) (01/30 0943) Pulse Rate:  [81-87] 86 (01/30 0943) Resp:  [16-17] 16 (01/30 0556) BP: (97-111)/(59-61) 104/61 mmHg (01/30 0943) SpO2:  [90 %-98 %] 98 % (01/30 0943) Weight:  [67.767 kg (149 lb 6.4 oz)] 67.767 kg (149 lb 6.4 oz) (01/30 0613) Last BM Date: 10/07/15  Weight change: Filed Weights   10/06/15 0440 10/07/15 0357 10/08/15 0613  Weight: 72.984 kg (160 lb 14.4 oz) 70.716 kg (155 lb 14.4 oz) 67.767 kg (149 lb 6.4 oz)    Intake/Output:   Intake/Output Summary (Last 24 hours) at 10/08/15 0949 Last data filed at 10/08/15 0846  Gross per 24 hour  Intake    223 ml  Output   2350 ml  Net  -2127 ml     Physical Exam: General: Pleasant, NAD Psych: Normal affect. Neuro: Alert and oriented X 3. Moves all extremities spontaneously. HEENT: Normal Neck: Supple without bruits. JVP ~ 7 Lungs: Resp regular and unlabored, decreased BS throughout  Heart: Distant HS RRR no s3, s4, or murmurs. +RV lift Abdomen: Soft, non-tender, non-distended, BS + x 4.  Extremities: No clubbing, cyanosis. tr-1+ bilat LE edema. TEDs DP/PT/Radials 2  Telemetry: NSR 80s  Labs: Basic Metabolic Panel:  Recent Labs Lab 10/02/15 0009 10/02/15 0740 10/03/15 0400 10/04/15 0408 10/06/15 0315 10/07/15 0340  NA  --  146* 144 145 143 140  K  --  3.5 3.6 4.3 4.9 5.0  CL  --  98* 99* 99* 96* 99*  CO2  --  34* 33* 35* 36* 35*  GLUCOSE  --  94 102* 95 98 89  BUN  --  _0 21* 22*  CREATININE  --  1.13* 1.12* 1.14* 1.23* 1.26*  CALCIUM  --  9.4 8.7* 8.8* 9.3 9.0  MG 1.9  --   --   --   --   --   PHOS 4.3  --   --   --   --   --     Liver Function Tests:  Recent Labs Lab 10/01/15 1925 10/07/15 0940  AST 26 29  ALT 14 19  ALKPHOS 85 85  BILITOT 1.9* 2.0*  PROT 6.3* 5.7*  ALBUMIN 3.7 3.2*   No results for input(s): LIPASE, AMYLASE in the last 168 hours. No results for input(s): AMMONIA in the last 168 hours.  CBC:  Recent Labs Lab 10/01/15 1925 10/03/15 0400 10/07/15 1420  WBC 6.0 4.9 5.6  HGB 16.7* 15.0 15.4*  HCT 52.0* 48.4* 49.4*  MCV 103.2* 105.4* 102.7*  PLT 188 193 179    Cardiac Enzymes:  Recent Labs Lab 10/02/15 0132 10/02/15 0740  TROPONINI <0.03 <0.03    BNP: BNP (last 3 results) No results for input(s): BNP in the last 8760 hours.  ProBNP (last 3 results)  Recent Labs  10/01/15 1531  PROBNP 1159.0*      Other results:  Imaging: No results found.  Medications:     Scheduled Medications: . aspirin EC  81 mg Oral Daily  . enoxaparin (LOVENOX) injection  40 mg Subcutaneous Q24H  . fluticasone  1 spray Each Nare Daily  . furosemide  40 mg Intravenous BID  . mometasone-formoterol  2 puff Inhalation BID  . nitroGLYCERIN  1 inch Topical Once  . sodium chloride  3 mL Intravenous Q12H  . sodium chloride flush  3 mL Intravenous Q12H  . spironolactone  12.5 mg Oral Daily  . tiotropium  18 mcg Inhalation Daily    Infusions: . sodium chloride 10 mL/hr at 10/08/15 0600    PRN Medications: sodium chloride, albuterol, guaiFENesin-dextromethorphan, sodium chloride flush   Assessment:   1. PAH with acute right heart failure 2. Pericardial effusion 3. Acute respiratory failure 4. COPD 5. Polycythemia suspect due to chronic hypoxia 6. Hyperkalemia  Plan/Discussion:    Volume overload improving weight down 19 pounds . BMET pending. Hold lasix for now. Continue TED hose. R/L heart cath  today  Chest CT images reviewed personally. Results as above. Etiology of PAH and RV failure remains unclear. Group 3 is leading suspect but she has progressed rapidly since 2015 and I suspect PAH may be out of proportion to COPD. She has diffuse CAD on CT. RV infarct also a possibility.   Check PFTs with DLCO. Auto-immune and infectious serologies negative to date  Length of Stay: 6   Hila Bolding MD 10/08/2015, 9:49 AM  Advanced Heart Failure Team Pager [phone] (M-F; [place])  Please contact [facility] for night-coverage after hours (4p -7a ) and weekends on [email or web address]

## 2015-10-08 NOTE — Progress Notes (Signed)
Site area: rt groin fa and fv sheaths Site Prior to Removal:  Level 0 w/multiple stick sites and faint bruising Pressure Applied For:  20 minutes Manual:   yes Patient Status During Pull:  stable Post Pull Site:  Level  0 Post Pull Instructions Given:  yes Post Pull Pulses Present: yes Dressing Applied:  tegaderm Bedrest begins @  1350 Comments:

## 2015-10-08 NOTE — Progress Notes (Signed)
Benefit check in progress for sildenafil 20 tid. Mindi Slicker [facility] [phone]

## 2015-10-08 NOTE — Care Management Important Message (Signed)
Important Message  Patient Details  Name: BREEANNA GALGANO MRN: [id number] Date of Birth: 1946/08/31   Medicare Important Message Given:  Yes    Barb Merino Tiombe Tomeo 10/08/2015, 2:38 PM

## 2015-10-09 DIAGNOSIS — J439 Emphysema, unspecified: Secondary | ICD-10-CM

## 2015-10-09 LAB — BASIC METABOLIC PANEL
ANION GAP: 10 (ref 5–15)
BUN: 27 mg/dL — AB (ref 6–20)
CALCIUM: 8.8 mg/dL — AB (ref 8.9–10.3)
CO2: 32 mmol/L (ref 22–32)
Chloride: 97 mmol/L — ABNORMAL LOW (ref 101–111)
Creatinine, Ser: 1.38 mg/dL — ABNORMAL HIGH (ref 0.44–1.00)
GFR calc Af Amer: 45 mL/min — ABNORMAL LOW (ref >60)
GFR, EST NON AFRICAN AMERICAN: 39 mL/min — AB (ref >60)
GLUCOSE: 112 mg/dL — AB (ref 65–99)
Potassium: 4 mmol/L (ref 3.5–5.1)
Sodium: 139 mmol/L (ref 135–145)

## 2015-10-09 LAB — CBC
HCT: 47.2 % — ABNORMAL HIGH (ref 36.0–46.0)
HEMOGLOBIN: 15.2 g/dL — AB (ref 12.0–15.0)
MCH: 32.5 pg (ref 26.0–34.0)
MCHC: 32.2 g/dL (ref 30.0–36.0)
MCV: 100.9 fL — ABNORMAL HIGH (ref 78.0–100.0)
Platelets: 173 10*3/uL (ref 150–400)
RBC: 4.68 MIL/uL (ref 3.87–5.11)
RDW: 16.6 % — AB (ref 11.5–15.5)
WBC: 5.6 10*3/uL (ref 4.0–10.5)

## 2015-10-09 MED ORDER — TIOTROPIUM BROMIDE MONOHYDRATE 18 MCG IN CAPS: 18.0000 ug | ORAL_CAPSULE | Freq: Every day | RESPIRATORY_TRACT | Status: DC

## 2015-10-09 MED ORDER — POTASSIUM CHLORIDE ER 10 MEQ PO TBCR: 10.0000 meq | EXTENDED_RELEASE_TABLET | Freq: Every day | ORAL | Status: DC

## 2015-10-09 MED ORDER — SPIRONOLACTONE 25 MG PO TABS: 12.5000 mg | ORAL_TABLET | Freq: Every day | ORAL | Status: DC

## 2015-10-09 MED ORDER — MOMETASONE FURO-FORMOTEROL FUM 100-5 MCG/ACT IN AERO: 2.0000 | INHALATION_SPRAY | Freq: Two times a day (BID) | RESPIRATORY_TRACT | Status: DC

## 2015-10-09 MED ORDER — FUROSEMIDE 20 MG PO TABS: 20.0000 mg | ORAL_TABLET | Freq: Every day | ORAL | Status: DC

## 2015-10-09 MED ORDER — SILDENAFIL CITRATE 20 MG PO TABS: 20.0000 mg | ORAL_TABLET | Freq: Three times a day (TID) | ORAL | Status: DC

## 2015-10-09 MED ORDER — ASPIRIN 81 MG PO TBEC: 81.0000 mg | DELAYED_RELEASE_TABLET | Freq: Every day | ORAL | Status: AC

## 2015-10-09 NOTE — Care Management Note (Signed)
Case Management Note  Patient Details  Name: Linda Stevens MRN: [id number] Date of Birth: [birth year]  Subjective/Objective:        Pulmonary hypertension, COPD            Action/Plan: NCM spoke to pt and husband, Linda Stevens at bedside. Verified address and contact information. Pt states she has used [facility] in the past for [facility] RN. States at this time she does not want [facility] RN. States she has a pulse ox device to check her oxygen saturation at home. Plans to borrow shower chair to use in her bathroom. States she able to ambulate without any DME. Provided pt with information about copay amount of $45 for Revatio. States she can afford medication. Contacted [facility] DME rep for oxygen. Waiting oxygen saturations while ambulating documentation.   Expected Discharge Date:  10/09/2015             Expected Discharge Plan:  Home Self Care In-House Referral:  NA  Discharge planning Services  CM Consult  Post Acute Care Choice:  NA Choice offered to:  NA  DME Arranged:  Oxygen DME Agency:  [place]:  NA [place] Agency:  NA  Status of Service:  Completed, signed off  Medicare Important Message Given:  Yes Date Medicare IM Given:    Medicare IM give by:    Date Additional Medicare IM Given:    Additional Medicare Important Message give by:     If discussed at [place] of Stay Meetings, dates discussed:    Additional Comments:  Erenest Rasher, RN 10/09/2015, 10:49 AM

## 2015-10-09 NOTE — Progress Notes (Signed)
PCCM PROGRESS NOTE  ADMISSION DATE: 10/01/2015 CONSULT DATE: 10/03/2015 REFERRING PROVIDER: [facility]  CC: Short of breath  SUBJECTIVE: Had Rt heart cath yesterday >> started on revatio.  VITAL SIGNS: BP 91/49 mmHg  Pulse 74  Temp(Src) 97.9 F (36.6 C) (Oral)  Resp 17  Ht 5' 2" (1.575 m)  Wt 148 lb 1.6 oz (67.178 kg)  BMI 27.08 kg/m2  SpO2 92%  INTAKE/OUTPUT: I/O last 3 completed shifts: In: 1180 [P.O.:1160; I.V.:20] Out: 2275 [Urine:2275]  General: pleasant HEENT: no sinus tenderness Cardiac: regular, no murmur Chest: no wheeze Abd: soft, non tender Ext: no edema, no joint swelling Neuro: normal strength Skin: no rashes   CBC Recent Labs     10/07/15  1420  10/08/15  0930  10/09/15  0437  WBC  5.6  5.6  5.6  HGB  15.4*  16.7*  15.2*  HCT  49.4*  51.6*  47.2*  PLT  179  172  173    Coag's Recent Labs     10/08/15  0930  INR  1.11    BMET Recent Labs     10/07/15  0340  10/08/15  0930  10/09/15  0437  NA  140  145  139  K  5.0  4.4  4.0  CL  99*  97*  97*  CO2  35*  35*  32  BUN  22*  23*  27*  CREATININE  1.26*  1.34*  1.38*  GLUCOSE  89  97  112*    Electrolytes Recent Labs     10/07/15  0340  10/08/15  0930  10/09/15  0437  CALCIUM  9.0  9.7  8.8*    Liver Enzymes Recent Labs     10/07/15  0940  AST  29  ALT  19  ALKPHOS  85  BILITOT  2.0*  ALBUMIN  3.2*    Imaging No results found.   STUDIES: 1/24 Echo >> EF 40%, grade 1 diastolic dysfx, severe RV dilation, PAS 87 mmHg, mod pericardial effusion 1/26 Doppler legs >> negative 1/26 V/Q scan >> low probability for PE 1/27 CT chest >> centrilobular emphysema, changes of cirrhosis, atherosclerosis, pericardial effusion, small Rt pleural effusion 1/27 Serology >> RF 99.8, Anti CCP 10, Anti Jo < 0.2, SS A/B negative, Scl 70 negative, ANA 1:320 homogenous pattern/1:1280 speckled pattern 1/30 PFT >> FEV1 0.91 (40%), FEV1% 56, TLC 4.32 (88%), DLCO 38%, no BD 1/30 Rt/Lt heart cath  >> RA 9, RV 97/8/12, PA 104/49/72, PCW 18, CI 2.34, PVR 13.6  EVENTS: 1/23 Admit 1/26 Cardiology consulted  DISCUSSION: 70 yo female former smoker presented with dyspnea and edema with hypoxia.  Concern was for CHF exacerbation.  Echo findings consistent with cor pulmonale and severe pulmonary HTN.  Her PFT this admission show severe obstruction and diffusion defect.  She has hx of HTN, diastolic CHF, COPD, allergies.  She has significant pulmonary hypertension that might be out of proportion to what would be expected from her COPD.  She has elevated rheumatoid factor and ANA, but no clinical findings for connective tissue disease >> it is possible she could have CTD contributing to her pulmonary HTN.  She has changes of cirrhosis noted on CT chest.  ASSESSMENT/PLAN:  Severe COPD with emphysema. - continue spiriva, dulera, prn albuterol  Acute on chronic hypoxic respiratory >> elevated HCO3 on BMET suggests chronic hypercapnia in addition to hypoxia. - adjust oxygen to keep SpO2 > 92%  Changes of cirrhosis on CT chest. -  per primary team  Chronic diastolic CHF. Pericardial effusion. - per primary team and cardiology  Pulmonary hypertension. - revatio started 1/30 by cardiology  Polycythemia >> likely related to chronic hypoxic respiratory failure. - f/u CBC  Elevated rheumatoid factor and ANA. - will need rheumatology assessment as outpt  Updated pt's family at bedside.  D/w Dr. Wynelle Cleveland.  I have scheduled her for pulmonary follow up on 10/16/15 at 9 am with Tammy Parrett.  Chesley Mires, MD [facility] Pulmonary/Critical Care 10/09/2015, 9:35 AM Pager:  [phone] After 3pm call: [phone]

## 2015-10-09 NOTE — Progress Notes (Signed)
Benefit check for co pay for sildenafil 6m three times a day will be $45.75- prior authorization is not required. BMindi SlickerREastern [facility] [phone]

## 2015-10-09 NOTE — Discharge Summary (Addendum)
Physician Discharge Summary  Linda Stevens CBJ:[id number] DOB: 1946/07/01 DOA: 10/01/2015  PCP: Arnette Norris, MD  Admit date: 10/01/2015 Discharge date: 10/09/2015  Time spent: 60 minutes  Recommendations for Outpatient Follow-up:  1. Rheumatology follow up recommended for elevated ANA, RF factor 2. Needs Bmet in 1 wk to check renal function and K - new start on Lasix and K+ replacement 3. Due to new diagnosis of Cirrhosis, will need to have bloodwork to evaluate for underlying cirrhosis 4. Note baseline BP appears to be systolic of 16-073 and diastolic of 71-06 5. Will need 2 L O2 at all times  Discharge Condition: stable    Discharge Diagnoses:  Principal Problem:   Acute on chronic respiratory failure with hypoxia (HCC) Active Problems:   Severe COPD (chronic obstructive pulmonary disease) (HCC)   Severe Pulmonary Hypertension   Right heart failure with reduced right ventricular function (HCC)   Hypokalemia   Polycythemia   Lower extremity edema   Pericardial effusion   Acute respiratory failure with hypoxia and hypercapnea   History of present illness:  70 year old female with history of hypertension, COPD and asthma, history of diastolic dysfunction presented to the ED due to progressively worsening dyspnea with abdominal and lower extremity swelling of the past few weeks. Her O2 sats were in the low 80s on room air. She also reported orthopnea and PND for the past 2 days. Workup in the ED showed hypokalemia and elevated BNP of 1159. Chest x-ray showed pulmonary vascular prominence.   Hospital Course:  Acute on chronic hypoxic respiratory failure with fluid overload- secondary to pulmonary HTN, and severe COPD (A) severe pulm HTN - extensive work up reveals this is secondary to severe pulmonary artery hypertension which is possibly from  underlying severe COPD  - 2-D echo done showing grade 1 diastolic dysfunction and severely elevated pulmonary artery pressure of 87  mmHg. - High resolution CT chest:  moderate pericardial effusion vs pericardial thickening, possible cirrhosis and changes consistent with COPD - due to Pedal edema,  Doppler lower extremity performed- negative for DVT  - Cardiology consulted for right heart catheterization which confirmed severely elevated PA pressures and therefore started on Revatio by cardiology  - pulmonary team following as well- auto-immune labs sent for [facility]- ANA and Rh factor elevated- see labs below- needs Rheumatology f/u to determine the significant of this level of elevation -severe pedal edema has improved with diuretics- has diuresed about 8 L - to prevent recurrence of fluid overload, will now transition to daily oral low dose Lasix and K replacement - have asked her to do daily weights and f/u with PCP in 1 wk to ensure her dose of Lasix is adequate for her  (B) COPD -  based on PFTs, this is severe - will need O2 at all times - have started Spiriva & Dulera- cont Albuterol inhaler as needed - needs to continue to f/u with pulmonary as outpt  Polycythemia - suspected to be secondary to severe COPD- O2 initiated  Cirrhosis - noted on CT- possible cardiac cirrhosis - will need Hepatitis panel as outpt to evaluate for Hep C  Hypotension - SBP continuously in 90-100 range with a diastolic of 26-94W- this may be her norm - Continue Lasix with close monitoring of BP- ensure she is not becoming orthostatic by obtaining orthostatic vitals in office   Procedures: 1/24ECHO : EF 54%, grade 1 diastolic dysfx, severe RV dilation, PAS 87 mmHg, mod pericardial effusion 1/26 Doppler legs >> negative 1/26  V/Q scan >> low probability for PE 1/30 Rt/Lt heart cath >> RA 9, RV 97/8/12, PA 104/49/72, PCW 18, CI 2.34, PVR 13.6 1/30 PFT >> FEV1 0.91 (40%), FEV1% 56, TLC 4.32 (88%), DLCO 38%, no BD  Consultations:  Pulmonary  Cardiology   Discharge Exam: Filed Weights   10/07/15 0357 10/08/15 0613 10/09/15 0547   Weight: 70.716 kg (155 lb 14.4 oz) 67.767 kg (149 lb 6.4 oz) 67.178 kg (148 lb 1.6 oz)   Filed Vitals:   10/09/15 0049 10/09/15 0547  BP: 94/52 91/49  Pulse: 81 74  Temp: 98 F (36.7 C) 97.9 F (36.6 C)  Resp: 20 17    General: AAO x 3, no distress Cardiovascular: RRR, no murmurs  Respiratory: clear to auscultation bilaterally GI: soft, non-tender, non-distended, bowel sound positive  Discharge Instructions You were cared for by a hospitalist during your hospital stay. If you have any questions about your discharge medications or the care you received while you were in the hospital after you are discharged, you can call the unit and asked to speak with the hospitalist on call if the hospitalist that took care of you is not available. Once you are discharged, your primary care physician will handle any further medical issues. Please note that NO REFILLS for any discharge medications will be authorized once you are discharged, as it is imperative that you return to your primary care physician (or establish a relationship with a primary care physician if you do not have one) for your aftercare needs so that they can reassess your need for medications and monitor your lab values.      Discharge Instructions    (HEART FAILURE PATIENTS) Call MD:  Anytime you have any of the following symptoms: 1) 3 pound weight gain in 24 hours or 5 pounds in 1 week 2) shortness of breath, with or without a dry hacking cough 3) swelling in the hands, feet or stomach 4) if you have to sleep on extra pillows at night in order to breathe.    Complete by:  As directed      Diet - low sodium heart healthy    Complete by:  As directed      Discharge instructions    Complete by:  As directed   Continue O2 at all times Weight yourself daily- follow instruction for weight gain     Discharge instructions    Complete by:  As directed   You need to see your PCP in 1 wk. - Needs Bmet in 1 wk to check renal function  and potassium- follow BP closely You will need a Hepatitis panel checked by PCP for new diagnosis of Cirrhosis     Increase activity slowly    Complete by:  As directed             Medication List    STOP taking these medications        Fluticasone-Salmeterol 100-50 MCG/DOSE Aepb  Commonly known as:  ADVAIR  Replaced by:  mometasone-formoterol 100-5 MCG/ACT Aero     hydrochlorothiazide 12.5 MG capsule  Commonly known as:  MICROZIDE     Potassium 99 MG Tabs      TAKE these medications        albuterol 108 (90 Base) MCG/ACT inhaler  Commonly known as:  PROVENTIL HFA;VENTOLIN HFA  Inhale 1 puff into the lungs every 6 (six) hours as needed for wheezing.     aspirin 81 MG EC tablet  Take 1 tablet (  81 mg total) by mouth daily.     fluticasone 50 MCG/ACT nasal spray  Commonly known as:  FLONASE  PLACE 2 SPRAYS INTO BOTH NOSTRILS DAILY     furosemide 20 MG tablet  Commonly known as:  LASIX  Take 1 tablet (20 mg total) by mouth daily.     mometasone-formoterol 100-5 MCG/ACT Aero  Commonly known as:  DULERA  Inhale 2 puffs into the lungs 2 (two) times daily.     potassium chloride 10 MEQ tablet  Commonly known as:  K-DUR  Take 1 tablet (10 mEq total) by mouth daily.     sildenafil 20 MG tablet  Commonly known as:  REVATIO  Take 1 tablet (20 mg total) by mouth 3 (three) times daily.     tiotropium 18 MCG inhalation capsule  Commonly known as:  SPIRIVA  Place 1 capsule (18 mcg total) into inhaler and inhale daily.       Allergies  Allergen Reactions  . Amoxicillin-Pot Clavulanate     REACTION: gi upset Has patient had a PCN reaction causing immediate rash, facial/tongue/throat swelling, SOB or lightheadedness with hypotension: No Has patient had a PCN reaction causing severe rash involving mucus membranes or skin necrosis: No Has patient had a PCN reaction that required hospitalization : No Has patient had a PCN reaction occurring within the last 10 years: No If  all of the above answers are "NO", then may proceed with Cephalosporin use.   . Cefdinir     REACTION: gi  upset  . Moxifloxacin     REACTION: rash  . Singulair [Montelukast Sodium]     Itching    Follow-up Information    Follow up with [name], MD.   Specialty:  Cardiology   Contact information:   [address] [place] [address] [phone]       Follow up with Rexene Edison, NP On 10/16/2015.   Specialty:  Pulmonary Disease   Why:  Follow up with lung doctor at 9 am.   Contact information:   [address] [address] [phone]        The results of significant diagnostics from this hospitalization (including imaging, microbiology, ancillary and laboratory) are listed below for reference.    Significant Diagnostic Studies: Dg Chest 2 View  10/01/2015  CLINICAL DATA:  Worsening of lower extremity edema, shortness of breath; history of COPD and community-acquired pneumonia, former smoker. EXAM: CHEST  2 VIEW COMPARISON:  Chest x-ray of February 25, 2014 and CT scan of the chest of February 26, 2014. FINDINGS: The lungs are mildly hyperinflated. There is no focal infiltrate. There is no pleural effusion. The cardiac silhouette is enlarged. The pulmonary vascularity is prominent centrally but there is no cephalization of the vascular pattern. The mediastinum is normal in width. There is calcification in the wall of the aortic arch. There is multilevel degenerative disc disease of the thoracic spine. There is mild dextrocurvature centered in the mid thoracic spine which is not new. IMPRESSION: Stable cardiomegaly and central pulmonary vascular prominence superimposed upon COPD. There is no acute pneumonia nor pulmonary edema. Electronically Signed   By: David  Martinique M.D.   On: 10/01/2015 17:22   Ct Chest High Resolution  10/05/2015  CLINICAL DATA:  70 year old female with shortness of breath and low blood pressure. EXAM: CT CHEST WITHOUT  CONTRAST TECHNIQUE: Multidetector CT imaging of the chest was performed following the standard protocol without intravenous contrast. High resolution imaging of  the lungs, as well as inspiratory and expiratory imaging, was performed. COMPARISON:  Chest CT 02/26/2014. FINDINGS: Mediastinum/Lymph Nodes: Heart size is normal. Moderate pericardial effusion, which slightly distorts normal cardiac contours. No pericardial calcification. There is atherosclerosis of the thoracic aorta, the great vessels of the mediastinum and the coronary arteries, including calcified atherosclerotic plaque in the left anterior descending left circumflex and right coronary arteries. Multiple borderline enlarged and mildly enlarged mediastinal lymph nodes, measuring up to 1.3 cm in short axis in the low right paratracheal nodal station, similar to the prior study from 02/26/2014. Esophagus is unremarkable in appearance. No axillary lymphadenopathy Lungs/Pleura: Small right pleural effusion layering dependently. Minimal dependent subsegmental atelectasis in the right lower lobe. No acute consolidative airspace disease. No left pleural effusion. No suspicious appearing pulmonary nodules or masses. Mild diffuse bronchial wall thickening with mild centrilobular emphysema. High-resolution images demonstrate no significant areas of ground-glass attenuation, subpleural reticulation, parenchymal banding, traction bronchiectasis or frank honeycombing to indicate interstitial lung disease. Inspiratory and expiratory imaging is unremarkable. Upper abdomen: Trace amount of perihepatic ascites. Liver has a slightly nodular contour, suggesting underlying cirrhosis. Atherosclerosis in the abdominal aorta. Status post cholecystectomy. Musculoskeletal: There are no aggressive appearing lytic or blastic lesions noted in the visualized portions of the skeleton. IMPRESSION: 1. No findings to suggest interstitial lung disease. 2. Moderate amount of pericardial  fluid and/or thickening, with slight distortion of normal cardiac contours. If there is any clinical concern for constrictive physiology, further evaluation with dedicated echocardiography should be considered. No pericardial calcification is noted at this time. 3. Small right pleural effusion with minimal subsegmental atelectasis in the right lower lobe. 4. Mild diffuse bronchial thickening with mild centrilobular emphysema; imaging findings suggestive of underlying COPD. 5. Atherosclerosis, including three-vessel coronary artery disease. Please note that although the presence of coronary artery calcium documents the presence of coronary artery disease, the severity of this disease and any potential stenosis cannot be assessed on this non-gated CT examination. Assessment for potential risk factor modification, dietary therapy or pharmacologic therapy may be warranted, if clinically indicated. 6. Stigmata of cirrhosis.  Trace volume of ascites. 7. Status post cholecystectomy. Electronically Signed   By: Vinnie Langton M.D.   On: 10/05/2015 10:09   Nm Pulmonary Perf And Vent  10/04/2015  CLINICAL DATA:  Respiratory failure EXAM: NUCLEAR MEDICINE VENTILATION - PERFUSION LUNG SCAN TECHNIQUE: Ventilation images were obtained in multiple projections using inhaled aerosol Tc-70mDTPA. Perfusion images were obtained in multiple projections after intravenous injection of Tc-944mAA. RADIOPHARMACEUTICALS:  3202.5illicurie [id number] aerosol inhalation and 4.0 millicurie [id number]-10GAA IV COMPARISON:  Chest x-ray 10/01/2015 FINDINGS: Ventilation: No segmental ventilation defect. There is some clumping of the tracer in central airways Perfusion: No wedge shaped peripheral perfusion defects to suggest acute pulmonary embolism. Chest x-ray shows cardiomegaly. Mild hyperinflation. Mild elevation of the right hemidiaphragm. No infiltrate. Central mild vascular congestion. Findings are low probability for  pulmonary embolus. IMPRESSION: Low probability for pulmonary embolus. Electronically Signed   By: LiLahoma Crocker.D.   On: 10/04/2015 15:39    Microbiology: No results found for this or any previous visit (from the past 240 hour(s)).   Labs: Basic Metabolic Panel:  Recent Labs Lab 10/04/15 0408 10/06/15 0315 10/07/15 0340 10/08/15 0930 10/09/15 0437  NA 145 143 140 145 139  K 4.3 4.9 5.0 4.4 4.0  CL 99* 96* 99* 97* 97*  CO2 35* 36* 35* 35* 32  GLUCOSE 95 98 89 97 112*  BUN 20  21* 22* 23* 27*  CREATININE 1.14* 1.23* 1.26* 1.34* 1.38*  CALCIUM 8.8* 9.3 9.0 9.7 8.8*   Liver Function Tests:  Recent Labs Lab 10/07/15 0940  AST 29  ALT 19  ALKPHOS 85  BILITOT 2.0*  PROT 5.7*  ALBUMIN 3.2*   No results for input(s): LIPASE, AMYLASE in the last 168 hours. No results for input(s): AMMONIA in the last 168 hours. CBC:  Recent Labs Lab 10/03/15 0400 10/07/15 1420 10/08/15 0930 10/09/15 0437  WBC 4.9 5.6 5.6 5.6  HGB 15.0 15.4* 16.7* 15.2*  HCT 48.4* 49.4* 51.6* 47.2*  MCV 105.4* 102.7* 102.4* 100.9*  PLT 193 179 172 173   Cardiac Enzymes: No results for input(s): CKTOTAL, CKMB, CKMBINDEX, TROPONINI in the last 168 hours. BNP: BNP (last 3 results) No results for input(s): BNP in the last 8760 hours.  ProBNP (last 3 results)  Recent Labs  10/01/15 1531  PROBNP 1159.0*    CBG: No results for input(s): GLUCAP in the last 168 hours.     SignedDebbe Odea, MD [facility] 10/09/2015, 10:05 AM

## 2015-10-09 NOTE — Progress Notes (Signed)
SATURATION QUALIFICATIONS: (This note is used to comply with regulatory documentation for home oxygen)  Patient Saturations on Room Air at Rest = 86%  Patient Saturations on Room Air while Ambulating = 80%  Patient Saturations on 2 Liters of oxygen while Ambulating = 94%  Please briefly explain why patient needs home oxygen: pt needs o2 at home for adls

## 2015-10-09 NOTE — Progress Notes (Signed)
Advanced Heart Failure Rounding Note   Subjective:    VQ scan low prob. CT chest mild COPD. Moderate pericardial effusion. 3v CAD. + cirrhosis. Serology negative so far.   PFTs pending (FEV1 1.23 (52%) with FVC 2.01 (65%) and DLCO 51% in 7/15)  R/LHC 10/08/15 with normal coronaries, normal CO, and sever PAH with 13.6 WU.   Feels great today. No SOB/CP/lightheadedness/dizziness. Groin site ok. Some bruising, no bleeding. Wants to go home today.   [facility] 10/08/15 Coronary angiogram: Normal coronary arteries  Findings: Ao = 108/68 (86) LV = 102/10/11 RA = 9 RV = 97/8/12 PA = 104/49 (72) PCW = 18 Fick cardiac output/index = 3.96/2.34 PVR = 13.6 WU FA sat = 93% PA sat = 66%, 69%  PFTs 10/05/15 FVC 1.64 (55%), FEV1 0.91 (40%)  DLCO 8.80 (38%). PFTs 04/06/14 FVC 2.01 (65%), FEV1 1.23 (52%)  DLCO 12.25 (51%).  Objective:   Weight Range:  Vital Signs:   Temp:  [97.9 F (36.6 C)-98.2 F (36.8 C)] 97.9 F (36.6 C) (01/31 0547) Pulse Rate:  [0-96] 74 (01/31 0547) Resp:  [0-24] 17 (01/31 0547) BP: (90-142)/(47-75) 91/49 mmHg (01/31 0547) SpO2:  [0 %-100 %] 94 % (01/31 0547) Weight:  [148 lb 1.6 oz (67.178 kg)] 148 lb 1.6 oz (67.178 kg) (01/31 0547) Last BM Date: 10/08/15  Weight change: Filed Weights   10/07/15 0357 10/08/15 0613 10/09/15 0547  Weight: 155 lb 14.4 oz (70.716 kg) 149 lb 6.4 oz (67.767 kg) 148 lb 1.6 oz (67.178 kg)    Intake/Output:   Intake/Output Summary (Last 24 hours) at 10/09/15 0849 Last data filed at 10/09/15 3009  Gross per 24 hour  Intake   1200 ml  Output   1025 ml  Net    175 ml     Physical Exam: General: Pleasant, NAD Psych: Normal affect. Neuro: Alert and oriented X 3. Moves all extremities spontaneously. HEENT: Normal Neck: Supple without bruits. JVP ~ 6-7 Lungs: Resp regular and unlabored, decreased BS bibasilarly. Heart: Distant HS RRR no s3, s4, or murmurs. +RV lift Abdomen: Soft, non-tender, non-distended, BS + x  4.  Extremities: No clubbing, cyanosis. tr-1+ bilat LE edema. TEDs DP/PT/Radials 2 Groin: site CDI, scattered ecchymosis. No hematoma or bleeding.   Telemetry: Reviewed personally, NSR 100s  Labs: Basic Metabolic Panel:  Recent Labs Lab 10/04/15 0408 10/06/15 0315 10/07/15 0340 10/08/15 0930 10/09/15 0437  NA 145 143 140 145 139  K 4.3 4.9 5.0 4.4 4.0  CL 99* 96* 99* 97* 97*  CO2 35* 36* 35* 35* 32  GLUCOSE 95 98 89 97 112*  BUN 20 21* 22* 23* 27*  CREATININE 1.14* 1.23* 1.26* 1.34* 1.38*  CALCIUM 8.8* 9.3 9.0 9.7 8.8*    Liver Function Tests:  Recent Labs Lab 10/07/15 0940  AST 29  ALT 19  ALKPHOS 85  BILITOT 2.0*  PROT 5.7*  ALBUMIN 3.2*   No results for input(s): LIPASE, AMYLASE in the last 168 hours. No results for input(s): AMMONIA in the last 168 hours.  CBC:  Recent Labs Lab 10/03/15 0400 10/07/15 1420 10/08/15 0930 10/09/15 0437  WBC 4.9 5.6 5.6 5.6  HGB 15.0 15.4* 16.7* 15.2*  HCT 48.4* 49.4* 51.6* 47.2*  MCV 105.4* 102.7* 102.4* 100.9*  PLT 193 179 172 173    Cardiac Enzymes: No results for input(s): CKTOTAL, CKMB, CKMBINDEX, TROPONINI in the last 168 hours.  BNP: BNP (last 3 results) No results for input(s): BNP in the last 8760 hours.  ProBNP (last 3 results)  Recent Labs  10/01/15 1531  PROBNP 1159.0*      Other results:  Imaging: No results found.   Medications:     Scheduled Medications: . aspirin EC  81 mg Oral Daily  . enoxaparin (LOVENOX) injection  40 mg Subcutaneous Q24H  . fluticasone  1 spray Each Nare Daily  . mometasone-formoterol  2 puff Inhalation BID  . nitroGLYCERIN  1 inch Topical Once  . sildenafil  20 mg Oral TID  . sodium chloride  3 mL Intravenous Q12H  . sodium chloride flush  3 mL Intravenous Q12H  . spironolactone  12.5 mg Oral Daily  . tiotropium  18 mcg Inhalation Daily    Infusions:    PRN Medications: sodium chloride, acetaminophen, albuterol, guaiFENesin-dextromethorphan,  ondansetron (ZOFRAN) IV, sodium chloride flush   Assessment:   1. PAH with acute right heart failure 2. Pericardial effusion 3. Acute respiratory failure 4. COPD 5. Polycythemia suspect due to chronic hypoxia 6. Hyperkalemia  Plan/Discussion:    Volume status looks stable. Weight down 20 pounds overall. Creatinine and K stable.  Diuretics off currently. Had not been on any at home.   R/LHC 10/08/15 with normal coronaries, normal CO, and severe PAH with 13.6 WU.  PAH well out of proportion to left-sided filling pressures and COPD. tarted revatio 20 TID 10/08/15 with consideration for macitentan as outpatient.   PFTs 10/05/15 FVC 1.64 (55%), FEV1 0.91 (40%)  DLCO 8.80 (38%). Auto-immune and infectious serologies negative to date  Chest CT images reviewed by Dr. Haroldine Laws. Results as above. Etiology of PAH and RV failure remains unclear. Group 3 is leading suspect but she has progressed rapidly since 2015 and suspect PAH may be out of proportion to COPD. She has diffuse CAD on CT. RV infarct also a possibility.   Length of Stay: 7  Shirley Friar PA-C 10/09/2015, 8:49 AM  Advanced Heart Failure Team Pager [phone] (M-F; 7a - 4p)  Please contact [facility] for night-coverage after hours (4p -7a ) and weekends on [email or web address]  Patient seen and examined with Oda Kilts, PA-C. We discussed all aspects of the encounter. I agree with the assessment and plan as stated above.   She is stable post cath. Results of cath reviewed with her and her husband. We also reviewed PFTs which showed progression of her lung disease. That said, given severity of PAH and possibility of WHO Group I component will proceed carefully with trial of sildenafil. Can go home today. Will follow in HF Clinic. Reinforced need for daily weights and reviewed use of sliding scale diuretics.  Finas Delone,MD 10:34 PM

## 2015-10-09 NOTE — Progress Notes (Addendum)
[facility] PROGRESS NOTE  Linda Stevens MOQ:[id number] DOB: 1946-04-17 DOA: 10/01/2015 PCP: Arnette Norris, MD  70 year old female with history of hypertension, COPD and asthma, history of diastolic dysfunction presented to the ED due to progressively worsening dyspnea with abdominal and lower extremity swelling of the past few weeks. Her O2 sats were in the low 80s on room air. She also reported orthopnea and PND for the past 2 days. Workup in the ED showed hypokalemia and elevated BNP of 1159. Chest x-ray showed pulmonary vascular prominence.  HPI/Subjective: No new complaints. Pedal edema resolved. Mild dyspnea one exertion but not ambulating much.   Assessment/Plan: Acute hypoxic respiratory failure/ right heart failure - Secondary to severe pulmonary artery hypertension.  - 2-D echo done showing grade 1 diastolic dysfunction and severely elevated pulmonary artery pressure of 87 mmHg. - CT chest mod pericardial effusion +/- pericardial thickening, possible cirrhosis and changes consistent with COPD -Doppler lower extremity negative for DVT  -Cardiology consulted for right heart catheterization. - pulmonary following as well- auto-immune labs sent for [facility]- state she will need chronic anticoagulation - has diuresed about 6 L - diuretics per cardiology   Cirrhosis?  - noted on CT- obtain Hepatitis panel- possible cardiac cirrhosis  COPD - No exacerbation -  Spiriva, Dulera, albuterol nebulizer as needed.    Hypotension HCTZ held. Continue Lasix with close monitoring.  DVT prophylaxis: Subcutaneous Lovenox Diet: Heart healthy Code Status: Full code Disposition Plan: home when stable Consultants:  Pulmonary  Cardiology Procedures:  2-D echo  Doppler lower extremity Antibiotics: None   Objective: Filed Vitals:   10/09/15 0049 10/09/15 0547  BP: 94/52 91/49  Pulse: 81 74  Temp: 98 F (36.7 C) 97.9 F (36.6 C)  Resp: 20 17    Intake/Output Summary (Last  24 hours) at 10/09/15 0725 Last data filed at 10/09/15 0547  Gross per 24 hour  Intake    960 ml  Output   1025 ml  Net    -65 ml   Filed Weights   10/07/15 0357 10/08/15 0613 10/09/15 0547  Weight: 70.716 kg (155 lb 14.4 oz) 67.767 kg (149 lb 6.4 oz) 67.178 kg (148 lb 1.6 oz)    Exam:   General: Not in distress  HEENT: Moist mucosa  Resp: Diminished bibasilar breath sounds  CVS: Normal S1 and S2, no murmurs  GI: no distension, BS +, non-tender  Musculoskeletal: no pitting edema      Data Reviewed: Basic Metabolic Panel:  Recent Labs Lab 10/04/15 0408 10/06/15 0315 10/07/15 0340 10/08/15 0930 10/09/15 0437  NA 145 143 140 145 139  K 4.3 4.9 5.0 4.4 4.0  CL 99* 96* 99* 97* 97*  CO2 35* 36* 35* 35* 32  GLUCOSE 95 98 89 97 112*  BUN 20 21* 22* 23* 27*  CREATININE 1.14* 1.23* 1.26* 1.34* 1.38*  CALCIUM 8.8* 9.3 9.0 9.7 8.8*   Liver Function Tests:  Recent Labs Lab 10/07/15 0940  AST 29  ALT 19  ALKPHOS 85  BILITOT 2.0*  PROT 5.7*  ALBUMIN 3.2*   No results for input(s): LIPASE, AMYLASE in the last 168 hours. No results for input(s): AMMONIA in the last 168 hours. CBC:  Recent Labs Lab 10/03/15 0400 10/07/15 1420 10/08/15 0930 10/09/15 0437  WBC 4.9 5.6 5.6 5.6  HGB 15.0 15.4* 16.7* 15.2*  HCT 48.4* 49.4* 51.6* 47.2*  MCV 105.4* 102.7* 102.4* 100.9*  PLT 193 179 172 173   Cardiac Enzymes:  Recent Labs Lab 10/02/15 0740  TROPONINI <0.03   BNP (last 3 results) No results for input(s): BNP in the last 8760 hours.  ProBNP (last 3 results)  Recent Labs  10/01/15 1531  PROBNP 1159.0*    CBG: No results for input(s): GLUCAP in the last 168 hours.  No results found for this or any previous visit (from the past 240 hour(s)).   Studies: No results found.  Scheduled Meds: . aspirin EC  81 mg Oral Daily  . enoxaparin (LOVENOX) injection  40 mg Subcutaneous Q24H  . fluticasone  1 spray Each Nare Daily  . mometasone-formoterol  2  puff Inhalation BID  . nitroGLYCERIN  1 inch Topical Once  . sildenafil  20 mg Oral TID  . sodium chloride  3 mL Intravenous Q12H  . sodium chloride flush  3 mL Intravenous Q12H  . spironolactone  12.5 mg Oral Daily  . tiotropium  18 mcg Inhalation Daily   Continuous Infusions:      Time spent: 25 MINUTES    [facility]  [facility] Pager [phone] If 7PM-7AM, please contact night-coverage at [email or web address], password [email or web address] [place] 10/09/2015, 7:25 AM  LOS: 7 days

## 2015-10-10 ENCOUNTER — Telehealth: Payer: Self-pay | Admitting: Internal Medicine

## 2015-10-10 ENCOUNTER — Telehealth: Payer: Self-pay

## 2015-10-10 LAB — QUANTIFERON IN TUBE
QFT TB AG MINUS NIL VALUE: 0.01 IU/mL
QUANTIFERON NIL VALUE: 0.04 [IU]/mL
QUANTIFERON TB AG VALUE: 0.05 [IU]/mL
QUANTIFERON TB GOLD: NEGATIVE

## 2015-10-10 LAB — QUANTIFERON TB GOLD ASSAY (BLOOD)

## 2015-10-10 MED ORDER — DIPHENHYDRAMINE HCL 25 MG PO TABS: 25.0000 mg | ORAL_TABLET | Freq: Every evening | ORAL | Status: DC | PRN

## 2015-10-10 NOTE — Telephone Encounter (Addendum)
Last OV with MW on 04/06/2014  Called and spoke with patient. Pt stated that she was discharged from the hospital on 10/09/15 but needs a PA done on her Revatio and Dulera. I explained to her that we did not prescribe the medications but that they were sent to the pharmacy on 10/09/15 by Dr. Wynelle Cleveland. She stated that she would contact [facility] on [address]. I explained to her that she has an ov with TP on 10/16/15 and we can address that at the Hospital follow up visit. She voiced understanding and had no further questions. Nothing further is needed at this time.    Will send message to TP as FYI

## 2015-10-10 NOTE — Telephone Encounter (Signed)
Contacted patient to advise her that Dr. Deborra Medina stated she could use Benadryl as a sleep aid. Additionally, advised patient that a call had been made to [facility] to request assistance with her prior authorizations. Encouraged patient if she has not able to obtain medication on 10/11/15 to contact [facility]/Dr. Work for assistance.

## 2015-10-10 NOTE — Telephone Encounter (Signed)
Yes she can try benadryl as a sleep aid.

## 2015-10-10 NOTE — Telephone Encounter (Signed)
Spoke with Claiborne Billings to determine if prescribing MD Debbe Odea was affiliated with this office. Claiborne Billings stated she would send patient's pulmonologist a note regarding issues with patient's medications.

## 2015-10-10 NOTE — Telephone Encounter (Signed)
Per pharmacist, a prior authorization is needed for [facility] and Revatio. Pharmacist stated a prior authorization had been faxed to office.

## 2015-10-10 NOTE — Telephone Encounter (Signed)
During TCM call, patient reported she was unable to obtain [facility] and Revatio from [place]. Advised patient that I would contact pharmacy to determine status of medications.

## 2015-10-10 NOTE — Telephone Encounter (Signed)
Advised patient that a prior authorization was need from prescribing doctor.

## 2015-10-10 NOTE — Addendum Note (Signed)
Addended by: Lucille Passy on: 10/10/2015 11:39 AM   Modules accepted: Orders

## 2015-10-10 NOTE — Telephone Encounter (Signed)
Patient admitted and discharged from [facility] from 10/01/15-10/08/15 due to acute on chronic diastolic CHF. Patient would like to use Benadryl as a sleep aid to assist with insomnia and pain at night. Please advise if this is appropriate.    Transition Care Management Follow-up Telephone Call   Date discharged? 10/08/15   How have you been since you were released from the hospital? Pt and spouse reported she had a bad night. Experienced insomnia, back pain, and leg pain. Pain scale 5.5/10. States pain occurs only at night. O2 Sat dropped intermittently below 90% per spouse. Currently on O2 @ 2L [place] continuous.    Do you understand why you were in the hospital? YES   Do you understand the discharge instructions? YES   Where were you discharged to? HOME   Items Reviewed:  Medications reviewed: YES, has not started Revatio and Dulera at this time due to issues with pharmacy.   Allergies reviewed: YES  Dietary changes reviewed: YES  Referrals reviewed: YES   Functional Questionnaire:   Activities of Daily Living (ADLs):   She states they are independent in the following: independent with ADLs, shower/bath chair used States they require assistance with the following: none   Any transportation issues/concerns? NO   Any patient concerns? NO   Confirmed importance and date/time of follow-up visits scheduled YES  Provider Appointment booked with Dr. Deborra Medina on 10/15/15 @ 12PM  Confirmed with patient if condition begins to worsen call PCP or go to the ER.  Patient was given the office number and encouraged to call back with question or concerns.  YES

## 2015-10-11 NOTE — Telephone Encounter (Signed)
TP aware. Pt has ov on 10/15/14 for hospital follow up. Will address PA at ov.  Nothing further needed.

## 2015-10-11 NOTE — Telephone Encounter (Signed)
Talked with husband.  Heather with [facility] is taking care of the PA for Revatio.  This is a specialized heart failure medication. She will be seeing Dr. Haroldine Laws at the [facility] 2/17. Ruthe Mannan also requires a PA.  Since this is a pulmonary inhaler and not a heart failure medication we will defer to her PCP and Pulmonary MD. I see that she has a post hospital appointment with Rexene Edison NP at [facility] on 2/7  Will route to Rexene Edison NP at [facility] Husband informed

## 2015-10-15 ENCOUNTER — Encounter: Payer: Self-pay | Admitting: Family Medicine

## 2015-10-15 ENCOUNTER — Ambulatory Visit (INDEPENDENT_AMBULATORY_CARE_PROVIDER_SITE_OTHER): Payer: Medicare HMO | Admitting: Family Medicine

## 2015-10-15 VITALS — BP 116/70 | HR 110 | Temp 98.1°F | Wt 151.5 lb

## 2015-10-15 DIAGNOSIS — R600 Localized edema: Secondary | ICD-10-CM | POA: Diagnosis not present

## 2015-10-15 DIAGNOSIS — J9601 Acute respiratory failure with hypoxia: Secondary | ICD-10-CM

## 2015-10-15 DIAGNOSIS — I509 Heart failure, unspecified: Secondary | ICD-10-CM | POA: Diagnosis not present

## 2015-10-15 DIAGNOSIS — I272 Other secondary pulmonary hypertension: Secondary | ICD-10-CM

## 2015-10-15 DIAGNOSIS — K7460 Unspecified cirrhosis of liver: Secondary | ICD-10-CM

## 2015-10-15 DIAGNOSIS — J439 Emphysema, unspecified: Secondary | ICD-10-CM

## 2015-10-15 DIAGNOSIS — I50810 Right heart failure, unspecified: Secondary | ICD-10-CM

## 2015-10-15 LAB — BASIC METABOLIC PANEL
BUN: 17 mg/dL (ref 6–23)
CALCIUM: 9.3 mg/dL (ref 8.4–10.5)
CHLORIDE: 99 meq/L (ref 96–112)
CO2: 36 mEq/L — ABNORMAL HIGH (ref 19–32)
CREATININE: 1.03 mg/dL (ref 0.40–1.20)
GFR: 56.73 mL/min — AB (ref >60.00)
Glucose, Bld: 106 mg/dL — ABNORMAL HIGH (ref 70–99)
Potassium: 4.2 mEq/L (ref 3.5–5.1)
Sodium: 140 mEq/L (ref 135–145)

## 2015-10-15 NOTE — Progress Notes (Signed)
Pre visit review using our clinic review tool, if applicable. No additional management support is needed unless otherwise documented below in the visit note.

## 2015-10-15 NOTE — Assessment & Plan Note (Signed)
Resolved.  Continue current dose of lasix and potassium. Check BMET today.

## 2015-10-15 NOTE — Assessment & Plan Note (Addendum)
New diagnosis- being managed by cardiology and pulm.  She has appt with pulmonary tomorrow.  Advised her to talk with them about getting possible samples of her inhalers until insurance comes through. No changes made to her rxs today.  She declines rheumatology referral.

## 2015-10-15 NOTE — Progress Notes (Signed)
Subjective:   Patient ID: Linda Stevens, female    DOB: 07-May-1946, 70 y.o.   MRN: [id number]  Linda Stevens is a pleasant 70 y.o. year old female who presents to clinic today with Hospitalization Follow-up  on 10/15/2015  HPI:  Notes reviewed.  I sent her to the ER when her BNP was quite elevated and she was sating in 80s on room air with progressive edema.  Acute on chronic resp failure with volume overload- had extensive work up- pulmonary and cardiology consulted. Felt it is secondary to severe PAH and COPD.  2 D echo- severe PAH and grade I diastolic dysfunction.  CT of chest- moderate pericardial effusion vs pericardial thickening with cirrhosis and chagnes of COPD.  LE dopplers were neg- done due to LE edema.  Right heart cath- confirmed PAH- started on Revatio by cardiology.   Still having some pain and bruisining in her right groin area.  ANA and Rh Factor elevated- suggested rheum referral.  She was diuresed 8 L of fluid and advised to take daily weights.  Weight at home ranging 148-151.  Started on continuous O2 for COPD along with spirava and dulera with as needed albuterol.   Using 1 L of O2 during the day unless she is out or sleeping, then bumps it up to 2 L. Has not been able to get dulera or revalatio yet--awaiting insurance approval.  Feels much better.  Less SOB.  Edema has resolved.  Current Outpatient Prescriptions on File Prior to Visit  Medication Sig Dispense Refill  . albuterol (PROVENTIL HFA;VENTOLIN HFA) 108 (90 BASE) MCG/ACT inhaler Inhale 1 puff into the lungs every 6 (six) hours as needed for wheezing. 1 Inhaler 11  . aspirin EC 81 MG EC tablet Take 1 tablet (81 mg total) by mouth daily. 30 tablet 0  . diphenhydrAMINE (BENADRYL) 25 MG tablet Take 1 tablet (25 mg total) by mouth at bedtime as needed. 30 tablet 0  . fluticasone (FLONASE) 50 MCG/ACT nasal spray PLACE 2 SPRAYS INTO BOTH NOSTRILS DAILY 16 g 5  . furosemide (LASIX) 20 MG  tablet Take 1 tablet (20 mg total) by mouth daily. 30 tablet 0  . potassium chloride (K-DUR) 10 MEQ tablet Take 1 tablet (10 mEq total) by mouth daily. 30 tablet 0  . tiotropium (SPIRIVA) 18 MCG inhalation capsule Place 1 capsule (18 mcg total) into inhaler and inhale daily. 30 capsule 12  . mometasone-formoterol (DULERA) 100-5 MCG/ACT AERO Inhale 2 puffs into the lungs 2 (two) times daily. (Patient not taking: Reported on 10/15/2015) 3 Inhaler 0  . sildenafil (REVATIO) 20 MG tablet Take 1 tablet (20 mg total) by mouth 3 (three) times daily. (Patient not taking: Reported on 10/15/2015) 10 tablet 0   No current facility-administered medications on file prior to visit.    Allergies  Allergen Reactions  . Amoxicillin-Pot Clavulanate     REACTION: gi upset Has patient had a PCN reaction causing immediate rash, facial/tongue/throat swelling, SOB or lightheadedness with hypotension: No Has patient had a PCN reaction causing severe rash involving mucus membranes or skin necrosis: No Has patient had a PCN reaction that required hospitalization : No Has patient had a PCN reaction occurring within the last 10 years: No If all of the above answers are "NO", then may proceed with Cephalosporin use.   . Cefdinir     REACTION: gi  upset  . Moxifloxacin     REACTION: rash  . Singulair [Montelukast Sodium]  Itching     Past Medical History  Diagnosis Date  . Allergic rhinitis due to pollen   . Hypertensive heart disease   . H/O seasonal allergies   . Diastolic dysfunction     a. 02/2014 Echo: EF 65-70%, Gr 1 DD, RVH;  b. 09/2015 Echo: EF 55%, Gr 1 DD.  [name] Acute respiratory failure with hypoxia and hypercapnea 02/26/2014  . Right heart failure with reduced right ventricular function ([place])     a. 09/2015 Echo: EF 55%, Gr 1 DD, D shaped IV septum, sev dil RV with mod reduced fxn, mod dil RA, RV-RA grad 75mHg, PASP 870mg, mod pericard eff w/o tamponade.  . Pericardial effusion     a. 09/2015 Echo: Mod  eff w/o tamponade.  . Severe Pulmonary Hypertension     a. 09/2015 Echo: PASP 8731m.  . CMarland KitchenPD (chronic obstructive pulmonary disease) ([facility] . Lower extremity edema   . History of tobacco abuse     a. Quit 2015.    Past Surgical History  Procedure Laterality Date  . Cholecystectomy      Open  . Cardiac catheterization N/A 10/08/2015    Procedure: Right/Left Heart Cath and Coronary Angiography;  Surgeon: DanJolaine ArtistD;  Location: [facility];  Service: Cardiovascular;  Laterality: N/A;    Family History  Problem Relation Age of Onset  . Glaucoma Brother   . Vascular Disease Father     Died of complications from surgery  . Asthma Mother     Died of asthma attack  . Dementia Mother     Social History   Social History  . Marital Status: Married    Spouse Name: N/A  . Number of Children: N/A  . Years of Education: N/A   Occupational History  . Not on file.   Social History Main Topics  . Smoking status: Former Smoker -- 1.00 packs/day for 40 years    Types: Cigarettes    Quit date: 02/22/2014  . Smokeless tobacco: Never Used  . Alcohol Use: No  . Drug Use: No  . Sexual Activity: No   Other Topics Concern  . Not on file   Social History Narrative   Married with 2 children.  Independent of ADLs.      Does not have a living will.   Would desire CPR but would not want prolonged life support if futile- husband aware.   The PMH, PSH, Social History, Family History, Medications, and allergies have been [facility] have been updated if relevant.   Review of Systems  Constitutional: Negative.   HENT: Negative.   Respiratory: Negative for chest tightness and wheezing.   Cardiovascular: Negative.   Endocrine: Negative.   Musculoskeletal: Negative.   Neurological: Negative.   Hematological: Negative.   Psychiatric/Behavioral: Negative.   All other systems reviewed and are negative.      Objective:    BP 116/70 mmHg  Pulse 110   Temp(Src) 98.1 F (36.7 C) (Oral)  Wt 151 lb 8 oz (68.72 kg)  SpO2 94%  Wt Readings from Last 3 Encounters:  10/15/15 151 lb 8 oz (68.72 kg)  10/09/15 148 lb 1.6 oz (67.178 kg)  10/01/15 172 lb 8 oz (78.245 kg)    Physical Exam  Constitutional: She is oriented to person, place, and time. She appears well-developed and well-nourished. No distress.  Wearing O2 Per [place]  HENT:  Head: Normocephalic.  Eyes: Conjunctivae are normal.  Cardiovascular:  Initially tachycardic  Pulmonary/Chest: Effort normal and breath sounds normal.  Musculoskeletal:  NO edema  Neurological: She is alert and oriented to person, place, and time. No cranial nerve deficit.  Skin: Skin is warm and dry. She is not diaphoretic.  significant bruising of right groin- no palpable masses  Psychiatric: She has a normal mood and affect. Her behavior is normal. Judgment and thought content normal.  Nursing note and vitals reviewed.         Assessment & Plan:   Acute respiratory failure with hypoxia and hypercapnea  Right heart failure with reduced right ventricular function (HCC)  Severe Pulmonary Hypertension  Pulmonary emphysema, unspecified emphysema type ([place])  Edema of lower extremity, unspecified laterality No Follow-up on file.

## 2015-10-15 NOTE — Assessment & Plan Note (Signed)
She agrees to hepatitis panel today.

## 2015-10-15 NOTE — Assessment & Plan Note (Signed)
Resolved. Felt to be secondary to severe PAH with underlying COPD.

## 2015-10-16 ENCOUNTER — Encounter: Payer: Self-pay | Admitting: Adult Health

## 2015-10-16 ENCOUNTER — Encounter: Payer: Self-pay | Admitting: *Deleted

## 2015-10-16 ENCOUNTER — Ambulatory Visit (INDEPENDENT_AMBULATORY_CARE_PROVIDER_SITE_OTHER): Payer: Medicare HMO | Admitting: Adult Health

## 2015-10-16 VITALS — BP 110/68 | HR 91 | Temp 97.7°F | Ht 62.0 in | Wt 151.0 lb

## 2015-10-16 DIAGNOSIS — J9621 Acute and chronic respiratory failure with hypoxia: Secondary | ICD-10-CM | POA: Diagnosis not present

## 2015-10-16 DIAGNOSIS — J9611 Chronic respiratory failure with hypoxia: Secondary | ICD-10-CM

## 2015-10-16 DIAGNOSIS — J449 Chronic obstructive pulmonary disease, unspecified: Secondary | ICD-10-CM | POA: Diagnosis not present

## 2015-10-16 DIAGNOSIS — I272 Other secondary pulmonary hypertension: Secondary | ICD-10-CM

## 2015-10-16 LAB — HEPATITIS PANEL, ACUTE
HCV Ab: NEGATIVE
HEP B C IGM: NONREACTIVE
Hep A IgM: NONREACTIVE
Hepatitis B Surface Ag: NEGATIVE

## 2015-10-16 NOTE — Assessment & Plan Note (Signed)
Progressive decline of COPD, GOLD III .  Cont on Spiriva and Dulera. If not affordable. We'll need to change to nebulizers with her Brovana , Pulmicort and Atrovent

## 2015-10-16 NOTE — Progress Notes (Signed)
Subjective:    Patient ID: Linda Stevens, female    DOB: March 04, 1946  MRN: [id number]    Brief patient profile:  62 yowf quit smoking 02/22/14  with baseline no need for 02, able to chase grandchildren around some, gardening and all housework  using inhaler alb/proair  sev times a day  Plus maint singulair and acutely ill on 02/19/14:   Admit date: 02/22/2014  Discharge date: 02/27/2014 Discharge Diagnosis:   Principal Problem:  1. Acute hypoxic and hypercarbic respiratory failure secondary to asthma exacerbation and CAP (community acquired pneumonia) Active Problems:  TOBACCO ABUSE  HTN (hypertension)  Hypoxia  Hypokalemia  Acute asthma flare  Thrush  Acute respiratory failure with hypoxia and hypercapnea Discharge Condition: Stable.  Diet recommendation: Low sodium, heart healthy.  History of Present Illness:   Linda Stevens is an 70 y.o. female with a PMH of asthma treated with PRN bronchodilator therapy, ongoing tobacco abuse who presented to her PCP on 02/20/14 with a chief complaint of upper respiratory symptoms including cough productive of clear/white mucus, sinus pain and pressure, head congestion, chills but no documented fever and pain around the eyes. She was given a prednisone Dosepak which failed to alleviate her symptoms, and returned to her PCP 02/22/14 who instructed her to come to the ER for further evaluation secondary to hypoxia. Upon initial evaluation in the ED, the patient was noted to have a pulse oximetry of 86% on room air (increased to 92-94% on supplemental oxygen), mild tachycardia, and a chest x-ray concerning for right middle lobe community-acquired pneumonia.  Hospital Course by Problem:   Principal Problem:  Acute hypoxic and hypercapnic respiratory failure secondary to asthma exacerbation/community-acquired pneumonia  Treated with empiric azithromycin/Rocephin. Patient not producing sputum, so sputum culture not sent. S. Pneumo & Legionella neg.  D/C home on Ceftin through 03/02/14.  Treated with Solu-Medrol 60 mg IV every 12 hours, nebulized bronchodilator therapy, and Mucinex/antitussives. D/C home on Advair, prednisone dose pack, Tylenol #3 PRN cough.  F/U CXR clear. ABG showed some hypoxia/hypercarbia. D-dimer not elevated, ProBNP mildly elevated (given a dose of Lasix). 2-D echo relatively normal. CT chest negative except for an enlarged lymph node and RML bronchiectasis.  Send home on home oxygen with [facility] RN to assess her periodically.  Pulmonology follow up arranged. Active Problems:  Diarrhea  C.diff negative.  Thrush  Likely from steroids. Treated with Nystatin. Tobacco abuse  Counseled.  Allergic rhinitis  Continue Singulair and Flonase. HTN (hypertension)  Continue HCTZ. Hypokalemia  Likely secondary to diuretic therapy.  Resolved with oral supplementation. Procedures:   2 D Echo 02/26/14:  Impressions:  - Technically difficult; vigorous LV function; grade 1 diastolic dysfunction; significant RVH and mildly reduced RV function; mild TR but signal inadequate to estimate pulmonary pressures.      03/09/2014 1st [place] [facility] office visit/ Melvyn Novas / post hosp consultation  Chief Complaint  Patient presents with  . Pulmonary Consult    Referred per Dr Rockne Menghini. [name] on 02/27/14 after having PNA. She c/o cough- prod with minimal white to clear sputum.  She states that she does not know if she is SOB or not.   d/c on 3lpm 24/7 and advair and about 50% back to baseline just mostly staying at home and no sob on 02 room t room, off prednisone x 1 week s relapse of symptoms and still not smoking  Now on advair with raspy dry cough day > night and worsening hoarseness rec  Try symbicort 160 Take 2 puffs first thing in am and then another 2 puffs about 12 hours later - if you don't like it, resume advair Work on inhaler technique:   Only use your albuterol (proair) as a rescue medication    04/06/2014 f/u  ov/[place] re: GOLD II COPD/ maintaining off cigs and doing fine on advair 250 bid  Chief Complaint  Patient presents with  . Followup with PFT    Pt states that her cough is much better. She states still has occ cough in the am with minimal clear sputum.   Walked half mile s 02 s stopping s 02 desat one day prior to [place]   Not using any albuterol at all  >>d/c o2   10/16/2015 Post hospital follow up : COPD GOLD III, PUlmonary HTN  Patient returns for a post hospital follow-up Patient was admitted last month with acute on chronic hypoxic respiratory failure with cor pulmonale. Extensive workup revealed severe underlying pulmonary artery hypertension possibly secondary to severe COPD. 2-D echo showed a grade 1 diastolic dysfunction and severely elevated pulmonary artery pressures at 87. A high resolution CT chest showed a moderate pericardial effusion versus thickening. No interstitial lung disease. Mild emphysema. VQ scan was low probability for PE. Right heart catheter was done that was confirmed severely elevated pulmonary artery pressures..  Autoimmune panel did show a positive rheumatoid factor and ANA.. Otherwise negative. She was recommended to start on Revatio , she is yet to start this. They're currently looking into patient assistance.[name] She was recommended to be referred to rheumatology. However, she has declined this referral. At this time and wants to wait. We discussed that we may need to repeat her levels and if still elevated will need to go to see rheumatology. She is in agreement. She was discharged on oxygen at 2 L. Says that she has been compliant. She says that she is feeling improved with less shortness of breath. Feels that her energy level is starting to return. She is supposed to be on [place]. She is yet to start these. She said that her Spiriva was expensive. We discussed looking at her formulary. And if needed we can change to nebulizers. She said that she would look it  or formulary call me back.. She denies any chest pain, orthopnea, PND, or increased leg swelling. Does complain of nasal dryness. Repeat PFTs on January 30 showed worsening COPD with an FEV1 at 40%, ratio 56. With a DLCO 38%.   TEST  /24 Echo >> EF 28%, grade 1 diastolic dysfx, severe RV dilation, PAS 87 mmHg, mod pericardial effusion 1/26 Doppler legs >> negative 1/26 V/Q scan >> low probability for PE 1/27 CT chest >> centrilobular emphysema, changes of cirrhosis, atherosclerosis, pericardial effusion, small Rt pleural effusion 1/27 Serology >> RF 99.8, Anti CCP 10, Anti Jo < 0.2, SS A/B negative, Scl 70 negative, ANA 1:320 homogenous pattern/1:1280 speckled pattern 1/30 PFT >> FEV1 0.91 (40%), FEV1% 56, TLC 4.32 (88%), DLCO 38%, no BD 1/30 Rt/Lt heart cath >> RA 9, RV 97/8/12, PA 104/49/72, PCW 18, CI 2.34, PVR 13.6   Past Medical History  Diagnosis Date  . Allergic rhinitis due to pollen   . Hypertensive heart disease   . H/O seasonal allergies   . Diastolic dysfunction     a. 02/2014 Echo: EF 65-70%, Gr 1 DD, RVH;  b. 09/2015 Echo: EF 55%, Gr 1 DD.  [name] Acute respiratory failure with hypoxia and hypercapnea 02/26/2014  .  Right heart failure with reduced right ventricular function ([place])     a. 09/2015 Echo: EF 55%, Gr 1 DD, D shaped IV septum, sev dil RV with mod reduced fxn, mod dil RA, RV-RA grad 1mHg, PASP 859mg, mod pericard eff w/o tamponade.  . Pericardial effusion     a. 09/2015 Echo: Mod eff w/o tamponade.  . Severe Pulmonary Hypertension     a. 09/2015 Echo: PASP 8745m.  . CMarland KitchenPD (chronic obstructive pulmonary disease) [place] . Lower extremity edema   . History of tobacco abuse     a. Quit 2015.   Current Outpatient Prescriptions on File Prior to Visit  Medication Sig Dispense Refill  . albuterol (PROVENTIL HFA;VENTOLIN HFA) 108 (90 BASE) MCG/ACT inhaler Inhale 1 puff into the lungs every 6 (six) hours as needed for wheezing. 1 Inhaler 11  . aspirin EC 81 MG EC tablet  Take 1 tablet (81 mg total) by mouth daily. 30 tablet 0  . diphenhydrAMINE (BENADRYL) 25 MG tablet Take 1 tablet (25 mg total) by mouth at bedtime as needed. 30 tablet 0  . fluticasone (FLONASE) 50 MCG/ACT nasal spray PLACE 2 SPRAYS INTO BOTH NOSTRILS DAILY 16 g 5  . furosemide (LASIX) 20 MG tablet Take 1 tablet (20 mg total) by mouth daily. 30 tablet 0  . potassium chloride (K-DUR) 10 MEQ tablet Take 1 tablet (10 mEq total) by mouth daily. 30 tablet 0  . tiotropium (SPIRIVA) 18 MCG inhalation capsule Place 1 capsule (18 mcg total) into inhaler and inhale daily. 30 capsule 12  . mometasone-formoterol (DULERA) 100-5 MCG/ACT AERO Inhale 2 puffs into the lungs 2 (two) times daily. (Patient not taking: Reported on 10/16/2015) 3 Inhaler 0  . sildenafil (REVATIO) 20 MG tablet Take 1 tablet (20 mg total) by mouth 3 (three) times daily. (Patient not taking: Reported on 10/16/2015) 10 tablet 0   No current facility-administered medications on file prior to visit.        Current Medications, Allergies, Complete Past Medical History, Past Surgical History, Family History, and Social History were reviewed in [employer].                 Objective:   Physical Exam  04/06/2014        154      Hoarse amb wf nad   HEENT mild turbinate edema.  Oropharynx no thrush or excess pnd or cobblestoning.  No JVD or cervical adenopathy. Mild accessory muscle hypertrophy. Trachea midline, nl thryroid. Chest was hyperinflated by percussion with diminished breath sounds and moderate increased exp time without wheeze. Hoover sign positive at mid inspiration. Regular rate and rhythm without murmur gallop or rub or increase P2 or edema.  Abd: no hsm, nl excursion. Ext warm without cyanosis or clubbing.     CT chest 02/26/14 w contrast  Mildly prominent precarinal lymph node without further evidence of  mediastinal masses or adenopathy.  Scarring versus atelectasis along the minor fissure  on the right and  left lung base.  Otherwise no evidence of thoracic pathology.  Small hiatal hernia.  HRCT 10/05/15 No findings to suggest interstitial lung disease. 2. Moderate amount of pericardial fluid and/or thickening, with slight distortion of normal cardiac contours. If there is any clinical concern for constrictive physiology, further evaluation with dedicated echocardiography should be considered. No pericardial calcification is noted at this time. 3. Small right pleural effusion with minimal subsegmental atelectasis in the right lower lobe. 4. Mild diffuse bronchial thickening  with mild centrilobular emphysema; imaging findings suggestive of underlying COPD.     Assessment & Plan:

## 2015-10-16 NOTE — Assessment & Plan Note (Signed)
Severe pulmonary hypertension. Autoimmune workup does show a positive ANA and rheumatoid factor. This will need to be repeated going forward and if still possible need referral to rheumatology. has severe right-sided dysfunction on echo . Consistent with cor pulmonale Patient will continue follow-up with cardiology and begin revising her when available. Continue on diuretics and oxygen.[name]

## 2015-10-16 NOTE — Patient Instructions (Addendum)
Saline nasal rinses and gel work well for nasal congestion.  Order for humidifier for Oxygen Concentrator at home  Continue on Oxygen 2l/m , keep O2 sats >90%.  Call back if you need different prescriptions for dulera /Spiriva or need to change to [place] to help with costs.  Follow up with Cardiology as planned .  follow up Dr. Melvyn Novas  In 3 weeks and As needed

## 2015-10-16 NOTE — Assessment & Plan Note (Signed)
Continue on oxygen 

## 2015-10-17 NOTE — Progress Notes (Signed)
Chart and office note reviewed in detail   > agree with a/p as outlined

## 2015-10-18 ENCOUNTER — Telehealth (HOSPITAL_COMMUNITY): Payer: Self-pay | Admitting: Pharmacist

## 2015-10-18 MED ORDER — SILDENAFIL CITRATE 20 MG PO TABS: 20.0000 mg | ORAL_TABLET | Freq: Three times a day (TID) | ORAL | Status: DC

## 2015-10-18 NOTE — Telephone Encounter (Signed)
Sildenafil 20 mg TID PA approved by [employer] through 09/07/2016. [facility] pharmacy notified.   Ruta Hinds. [name], PharmD, BCPS, CPP Clinical Pharmacist Pager: [phone] Phone: [phone] 10/18/2015 3:48 PM

## 2015-10-22 ENCOUNTER — Ambulatory Visit (HOSPITAL_COMMUNITY)
Admission: RE | Admit: 2015-10-22 | Discharge: 2015-10-22 | Disposition: A | Discharge status: Home / Self Care | Payer: Medicare HMO | Source: Ambulatory Visit | Attending: Internal Medicine | Admitting: Internal Medicine

## 2015-10-22 ENCOUNTER — Encounter (HOSPITAL_COMMUNITY): Payer: Self-pay | Admitting: Internal Medicine

## 2015-10-22 VITALS — BP 102/60 | HR 79 | Wt 150.2 lb

## 2015-10-22 DIAGNOSIS — I519 Heart disease, unspecified: Secondary | ICD-10-CM | POA: Insufficient documentation

## 2015-10-22 DIAGNOSIS — I313 Pericardial effusion (noninflammatory): Secondary | ICD-10-CM | POA: Insufficient documentation

## 2015-10-22 DIAGNOSIS — J9610 Chronic respiratory failure, unspecified whether with hypoxia or hypercapnia: Secondary | ICD-10-CM | POA: Insufficient documentation

## 2015-10-22 DIAGNOSIS — Z87891 Personal history of nicotine dependence: Secondary | ICD-10-CM | POA: Diagnosis not present

## 2015-10-22 DIAGNOSIS — Z7982 Long term (current) use of aspirin: Secondary | ICD-10-CM | POA: Diagnosis not present

## 2015-10-22 DIAGNOSIS — I272 Other secondary pulmonary hypertension: Secondary | ICD-10-CM | POA: Insufficient documentation

## 2015-10-22 DIAGNOSIS — Z79899 Other long term (current) drug therapy: Secondary | ICD-10-CM | POA: Insufficient documentation

## 2015-10-22 DIAGNOSIS — K7460 Unspecified cirrhosis of liver: Secondary | ICD-10-CM | POA: Diagnosis not present

## 2015-10-22 DIAGNOSIS — I3139 Other pericardial effusion (noninflammatory): Secondary | ICD-10-CM

## 2015-10-22 DIAGNOSIS — J449 Chronic obstructive pulmonary disease, unspecified: Secondary | ICD-10-CM | POA: Insufficient documentation

## 2015-10-22 DIAGNOSIS — I319 Disease of pericardium, unspecified: Secondary | ICD-10-CM | POA: Diagnosis not present

## 2015-10-22 DIAGNOSIS — I10 Essential (primary) hypertension: Secondary | ICD-10-CM | POA: Diagnosis not present

## 2015-10-22 DIAGNOSIS — I5189 Other ill-defined heart diseases: Secondary | ICD-10-CM

## 2015-10-22 DIAGNOSIS — I50810 Right heart failure, unspecified: Secondary | ICD-10-CM

## 2015-10-22 DIAGNOSIS — I2721 Secondary pulmonary arterial hypertension: Secondary | ICD-10-CM

## 2015-10-22 DIAGNOSIS — I509 Heart failure, unspecified: Secondary | ICD-10-CM | POA: Diagnosis not present

## 2015-10-22 LAB — BASIC METABOLIC PANEL
Anion gap: 15 (ref 5–15)
BUN: 17 mg/dL (ref 6–20)
CALCIUM: 9.7 mg/dL (ref 8.9–10.3)
CO2: 26 mmol/L (ref 22–32)
CREATININE: 0.99 mg/dL (ref 0.44–1.00)
Chloride: 102 mmol/L (ref 101–111)
GFR, EST NON AFRICAN AMERICAN: 58 mL/min — AB (ref >60)
Glucose, Bld: 91 mg/dL (ref 65–99)
Potassium: 4.2 mmol/L (ref 3.5–5.1)
SODIUM: 143 mmol/L (ref 135–145)

## 2015-10-22 LAB — BRAIN NATRIURETIC PEPTIDE: B NATRIURETIC PEPTIDE 5: 522.4 pg/mL — AB (ref 0.0–100.0)

## 2015-10-22 NOTE — Patient Instructions (Signed)
Labs today  You have been referred to Pulmonary Rehab, they will call you to schedule  Your physician recommends that you schedule a follow-up appointment in: 6 weeks with echocardiogram

## 2015-10-22 NOTE — Progress Notes (Signed)
Patient ID: Linda Stevens, female   DOB: 1946-04-03, 70 y.o.   MRN: [id number] PCP: Primary Cardiologist: Dr Haroldine Laws   HPI: 70 y/o woman with a h/o HTN, COPD (former smoker quit 2015), seasonal allergies, asthma, pulmonary HTN, and diastolic dysfxn.  Admitted 1/23 secondary to progressive DOE, hypoxia, and lower ext edema and has been found to have severe PAH. R/LHC 10/08/15 with normal coronaries, normal CO, and severe PAH PAP 104/49 with 13.6 WU. PAH well out of proportion to left-sided filling pressures and COPD. Started revatio 20 TID 10/08/15 with consideration for macitentan as outpatient. PFTs 10/05/15 FVC 1.64 (55%), FEV1 0.91 (40%) DLCO 8.80 (38%). Auto-immune and infectious serologies negative to date. Discharge weight 148 pounds.   Today she returns for HF follow up. Since is discharge she has been evaluated by pulmonary and she is continuing spiriva and dulera. Overall feeling ok. Denies SOB. Denies syncope/presyncope. On 2 liters continuous. Weight at home 149-151 pounds.  Following low salt diet and limiting fluid intake to < 2 liters. Taking all medications.   VQ scan low prob. CT chest mild COPD. Moderate pericardial effusion. 3v CAD. + cirrhosis. Serology negative so far.   PFTs pending (FEV1 1.23 (52%) with FVC 2.01 (65%) and DLCO 51% in 7/15)  R/LHC 10/08/15 with normal coronaries, normal CO, and sever PAH with 13.6 WU.  Findings: Ao = 108/68 (86) LV = 102/10/11 RA = 9 RV = 97/8/12 PA = 104/49 (72) PCW = 18 Fick cardiac output/index = 3.96/2.34 PVR = 13.6 WU FA sat = 93% PA sat = 66%, 69%  PFTs 10/05/15 FVC 1.64 (55%), FEV1 0.91 (40%) DLCO 8.80 (38%). PFTs 04/06/14 FVC 2.01 (65%), FEV1 1.23 (52%) DLCO 12.25 (51%).  ROS: All systems negative except as listed in HPI, PMH and Problem List.  SH:  Social History   Social History  . Marital Status: Married    Spouse Name: N/A  . Number of Children: N/A  . Years of Education: N/A   Occupational History  .  Not on file.   Social History Main Topics  . Smoking status: Former Smoker -- 1.00 packs/day for 40 years    Types: Cigarettes    Quit date: 02/22/2014  . Smokeless tobacco: Never Used  . Alcohol Use: No  . Drug Use: No  . Sexual Activity: No   Other Topics Concern  . Not on file   Social History Narrative   Married with 2 children.  Independent of ADLs.      Does not have a living will.   Would desire CPR but would not want prolonged life support if futile- husband aware.    FH:  Family History  Problem Relation Age of Onset  . Glaucoma Brother   . Vascular Disease Father     Died of complications from surgery  . Asthma Mother     Died of asthma attack  . Dementia Mother     Past Medical History  Diagnosis Date  . Allergic rhinitis due to pollen   . Hypertensive heart disease   . H/O seasonal allergies   . Diastolic dysfunction     a. 02/2014 Echo: EF 65-70%, Gr 1 DD, RVH;  b. 09/2015 Echo: EF 55%, Gr 1 DD.  [name] Acute respiratory failure with hypoxia and hypercapnea 02/26/2014  . Right heart failure with reduced right ventricular function ([place])     a. 09/2015 Echo: EF 55%, Gr 1 DD, D shaped IV septum, sev dil RV with mod reduced fxn,  mod dil RA, RV-RA grad 20mHg, PASP 857mg, mod pericard eff w/o tamponade.  . Pericardial effusion     a. 09/2015 Echo: Mod eff w/o tamponade.  . Severe Pulmonary Hypertension     a. 09/2015 Echo: PASP 8764m.  . CMarland KitchenPD (chronic obstructive pulmonary disease) ([place] . Lower extremity edema   . History of tobacco abuse     a. Quit 2015.    Current Outpatient Prescriptions  Medication Sig Dispense Refill  . albuterol (PROVENTIL HFA;VENTOLIN HFA) 108 (90 BASE) MCG/ACT inhaler Inhale 1 puff into the lungs every 6 (six) hours as needed for wheezing. 1 Inhaler 11  . aspirin EC 81 MG EC tablet Take 1 tablet (81 mg total) by mouth daily. 30 tablet 0  . diphenhydrAMINE (BENADRYL) 25 MG tablet Take 1 tablet (25 mg total) by mouth at bedtime as  needed. 30 tablet 0  . fluticasone (FLONASE) 50 MCG/ACT nasal spray PLACE 2 SPRAYS INTO BOTH NOSTRILS DAILY 16 g 5  . furosemide (LASIX) 20 MG tablet Take 1 tablet (20 mg total) by mouth daily. 30 tablet 0  . potassium chloride (K-DUR) 10 MEQ tablet Take 1 tablet (10 mEq total) by mouth daily. 30 tablet 0  . tiotropium (SPIRIVA) 18 MCG inhalation capsule Place 1 capsule (18 mcg total) into inhaler and inhale daily. 30 capsule 12   No current facility-administered medications for this encounter.    Filed Vitals:   10/22/15 1422  BP: 102/60  Pulse: 79  Weight: 150 lb 4 oz (68.153 kg)  SpO2: 97%    PHYSICAL EXAM:  General:  Well appearing. No resp difficulty HEENT: normal Neck: supple. JVP flat. Carotids 2+ bilaterally; no bruits. No lymphadenopathy or thryomegaly appreciated. Cor: PMI normal. Regular rate & rhythm. 2/6 TR loud P2 Lungs: decreased throughout. No wheezing Abdomen: soft, nontender, nondistended. No hepatosplenomegaly. No bruits or masses. Good bowel sounds. Extremities: no cyanosis, clubbing, rash, edema Neuro: alert & orientedx3, cranial nerves grossly intact. Moves all 4 extremities w/o difficulty. Affect pleasant.    ASSESSMENT & PLAN:  1. PAH with right heart failure- PFTs which showed progression of her lung disease. Given severity of PAH and possibility of WHO Group I component will proceed carefully with trial of sildenafil. Volume status stable. Continue lasix 20 mg daily. Start sildenafil 20 mg tid.   2. Pericardial effusion 3. Chronic Respiratory failure - On 2 liters oxygen 4. COPD- Gold III  - on inhalers. Follows with Pulmoanry 5. Polycythemia suspect due to chronic hypoxia 6. 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 Check BMET and BNP today. Follow up in 6 weeks with an ECHO   Amy Clegg,NP-C 3:35 PM   Patient seen and examined with AmyDarrick GrinderP. We discussed all aspects of the encounter. I agree with the assessment and plan as stated  above.   Suspect PAH mostly WHO Group 3 but may also have component of WHO GROUP I and II. Symptomatically much improved with diuresis. Will continue sildenafil and supplemental O2. Will check labs today. Refer to Pulmonary Rehab. Repeat echo to reassess PA pressures, RV strain and pericardial effusion. Will need 6MW at next visit.   Theodoros Stjames,MD 9:07 PM

## 2015-10-25 ENCOUNTER — Ambulatory Visit: Payer: Medicare HMO | Admitting: Cardiology

## 2015-10-29 ENCOUNTER — Ambulatory Visit: Payer: Medicare HMO | Admitting: Family Medicine

## 2015-10-30 ENCOUNTER — Telehealth: Payer: Self-pay | Admitting: Adult Health

## 2015-10-30 NOTE — Telephone Encounter (Signed)
Called spoke with pt. She is wanting an order for POC. Advised pt in order to get POC, she will have to be evaluated for the POC by DME to see if she is able to tolerate pulse dose. Pt then decided she did not want this and did not want an order placed. Nothing further needed

## 2015-11-05 ENCOUNTER — Telehealth (HOSPITAL_COMMUNITY): Payer: Self-pay | Admitting: Vascular Surgery

## 2015-11-05 ENCOUNTER — Other Ambulatory Visit (HOSPITAL_COMMUNITY): Payer: Self-pay | Admitting: *Deleted

## 2015-11-05 MED ORDER — FUROSEMIDE 20 MG PO TABS: 20.0000 mg | ORAL_TABLET | Freq: Every day | ORAL | Status: DC

## 2015-11-05 MED ORDER — SILDENAFIL CITRATE 20 MG PO TABS: 20.0000 mg | ORAL_TABLET | Freq: Three times a day (TID) | ORAL | Status: DC

## 2015-11-05 MED ORDER — POTASSIUM CHLORIDE ER 10 MEQ PO TBCR: 10.0000 meq | EXTENDED_RELEASE_TABLET | Freq: Every day | ORAL | Status: DC

## 2015-11-05 NOTE — Telephone Encounter (Signed)
Spoke w/pt, refills for Lasix and potassium sent in

## 2015-11-05 NOTE — Telephone Encounter (Signed)
Refills sent in

## 2015-11-05 NOTE — Telephone Encounter (Signed)
Please call pt about her Furosemide and Potassium she states she has not more refills and she wants to know if she needs to continue taking this medication, because Dr. Linna Hoff did not give her anymore refills.. Please advise

## 2015-11-06 ENCOUNTER — Ambulatory Visit (INDEPENDENT_AMBULATORY_CARE_PROVIDER_SITE_OTHER): Payer: Medicare HMO | Admitting: Internal Medicine

## 2015-11-06 ENCOUNTER — Encounter: Payer: Self-pay | Admitting: Internal Medicine

## 2015-11-06 VITALS — BP 102/60 | HR 90 | Ht 62.0 in | Wt 149.0 lb

## 2015-11-06 DIAGNOSIS — J188 Other pneumonia, unspecified organism: Secondary | ICD-10-CM | POA: Diagnosis not present

## 2015-11-06 DIAGNOSIS — R0602 Shortness of breath: Secondary | ICD-10-CM | POA: Diagnosis not present

## 2015-11-06 DIAGNOSIS — J45901 Unspecified asthma with (acute) exacerbation: Secondary | ICD-10-CM | POA: Diagnosis not present

## 2015-11-06 DIAGNOSIS — I509 Heart failure, unspecified: Secondary | ICD-10-CM | POA: Diagnosis not present

## 2015-11-06 DIAGNOSIS — R05 Cough: Secondary | ICD-10-CM | POA: Diagnosis not present

## 2015-11-06 DIAGNOSIS — J9601 Acute respiratory failure with hypoxia: Secondary | ICD-10-CM | POA: Diagnosis not present

## 2015-11-06 DIAGNOSIS — J449 Chronic obstructive pulmonary disease, unspecified: Secondary | ICD-10-CM | POA: Diagnosis not present

## 2015-11-06 DIAGNOSIS — J9611 Chronic respiratory failure with hypoxia: Secondary | ICD-10-CM

## 2015-11-06 DIAGNOSIS — I10 Essential (primary) hypertension: Secondary | ICD-10-CM | POA: Diagnosis not present

## 2015-11-06 DIAGNOSIS — I50810 Right heart failure, unspecified: Secondary | ICD-10-CM

## 2015-11-06 DIAGNOSIS — J45909 Unspecified asthma, uncomplicated: Secondary | ICD-10-CM | POA: Diagnosis not present

## 2015-11-06 DIAGNOSIS — R062 Wheezing: Secondary | ICD-10-CM | POA: Diagnosis not present

## 2015-11-06 MED ORDER — BUDESONIDE-FORMOTEROL FUMARATE 160-4.5 MCG/ACT IN AERO
INHALATION_SPRAY | RESPIRATORY_TRACT | Status: DC
Start: 2015-11-06 — End: 2015-12-06

## 2015-11-06 NOTE — Progress Notes (Signed)
Subjective:    Patient ID: Linda Stevens, female    DOB: March 04, 1946  MRN: [id number]    Brief patient profile:  62 yowf quit smoking 02/22/14  with baseline no need for 02, able to chase grandchildren around some, gardening and all housework  using inhaler alb/proair  sev times a day  Plus maint singulair and acutely ill on 02/19/14:   Admit date: 02/22/2014  Discharge date: 02/27/2014 Discharge Diagnosis:   Principal Problem:  1. Acute hypoxic and hypercarbic respiratory failure secondary to asthma exacerbation and CAP (community acquired pneumonia) Active Problems:  TOBACCO ABUSE  HTN (hypertension)  Hypoxia  Hypokalemia  Acute asthma flare  Thrush  Acute respiratory failure with hypoxia and hypercapnea Discharge Condition: Stable.  Diet recommendation: Low sodium, heart healthy.  History of Present Illness:   Linda Stevens is an 70 y.o. female with a PMH of asthma treated with PRN bronchodilator therapy, ongoing tobacco abuse who presented to her PCP on 02/20/14 with a chief complaint of upper respiratory symptoms including cough productive of clear/white mucus, sinus pain and pressure, head congestion, chills but no documented fever and pain around the eyes. She was given a prednisone Dosepak which failed to alleviate her symptoms, and returned to her PCP 02/22/14 who instructed her to come to the ER for further evaluation secondary to hypoxia. Upon initial evaluation in the ED, the patient was noted to have a pulse oximetry of 86% on room air (increased to 92-94% on supplemental oxygen), mild tachycardia, and a chest x-ray concerning for right middle lobe community-acquired pneumonia.  Hospital Course by Problem:   Principal Problem:  Acute hypoxic and hypercapnic respiratory failure secondary to asthma exacerbation/community-acquired pneumonia  Treated with empiric azithromycin/Rocephin. Patient not producing sputum, so sputum culture not sent. S. Pneumo & Legionella neg.  D/C home on Ceftin through 03/02/14.  Treated with Solu-Medrol 60 mg IV every 12 hours, nebulized bronchodilator therapy, and Mucinex/antitussives. D/C home on Advair, prednisone dose pack, Tylenol #3 PRN cough.  F/U CXR clear. ABG showed some hypoxia/hypercarbia. D-dimer not elevated, ProBNP mildly elevated (given a dose of Lasix). 2-D echo relatively normal. CT chest negative except for an enlarged lymph node and RML bronchiectasis.  Send home on home oxygen with Linda Stevens to assess her periodically.  Pulmonology follow up arranged. Active Problems:  Diarrhea  C.diff negative.  Thrush  Likely from steroids. Treated with Nystatin. Tobacco abuse  Counseled.  Allergic rhinitis  Continue Singulair and Flonase. HTN (hypertension)  Continue HCTZ. Hypokalemia  Likely secondary to diuretic therapy.  Resolved with oral supplementation. Procedures:   2 D Echo 02/26/14:  Impressions:  - Technically difficult; vigorous LV function; grade 1 diastolic dysfunction; significant RVH and mildly reduced RV function; mild TR but signal inadequate to estimate pulmonary pressures.      03/09/2014 1st [facility] office visit/ Linda Stevens / post hosp consultation  Chief Complaint  Patient presents with  . Pulmonary Consult    Referred per Dr Rockne Menghini. [name] on 02/27/14 after having PNA. She c/o cough- prod with minimal white to clear sputum.  She states that she does not know if she is SOB or not.   d/c on 3lpm 24/7 and advair and about 50% back to baseline just mostly staying at home and no sob on 02 room t room, off prednisone x 1 week s relapse of symptoms and still not smoking  Now on advair with raspy dry cough day > night and worsening hoarseness rec  Try symbicort 160 Take 2 puffs first thing in am and then another 2 puffs about 12 hours later - if you don't like it, resume advair Work on inhaler technique:   Only use your albuterol (proair) as a rescue medication    04/06/2014 f/u  [name] re: GOLD II COPD/ maintaining off cigs and doing fine on advair 250 bid  Chief Complaint  Patient presents with  . Followup with PFT    Pt states that her cough is much better. She states still has occ cough in the am with minimal clear sputum.   Walked half mile s 02 s stopping s 02 desat one day prior to [place]   Not using any albuterol at all  rec Try off advair  Only use your albuterol as a rescue medication   F/u prn    Started having  Breathing problems in spring 2016 got better with one visit/ short rx ? Prednisone  Happened again in fall 2016 breathing got some better and then  leg swelling developed:  Admit date: 10/01/2015 Discharge date: 10/09/2015     Recommendations for Outpatient Follow-up:  1. Rheumatology follow up recommended for elevated ANA, RF factor 2. Needs Bmet in 1 wk to check renal function and K - new start on Lasix and K+ replacement 3. Due to new diagnosis of Cirrhosis, will need to have bloodwork to evaluate for underlying cirrhosis 4. Note baseline BP appears to be systolic of 74-259 and diastolic of 56-38 5. Will need 2 L O2 at all times  Discharge Condition: stable   Discharge Diagnoses:  Principal Problem:  Acute on chronic respiratory failure with hypoxia (HCC) Active Problems:  Severe COPD (chronic obstructive pulmonary disease) (HCC)  Severe Pulmonary Hypertension  Right heart failure with reduced right ventricular function (HCC)  Hypokalemia  Polycythemia  Lower extremity edema  Pericardial effusion  Acute respiratory failure with hypoxia and hypercapnea   History of present illness:  70 year old female with history of hypertension, COPD and asthma, history of diastolic dysfunction presented to the ED due to progressively worsening dyspnea with abdominal and lower extremity swelling of the past few weeks. Her O2 sats were in the low 80s on room air. She also reported orthopnea and PND for the past 2 days. Workup in the ED  showed hypokalemia and elevated BNP of 1159. Chest x-ray showed pulmonary vascular prominence.   Hospital Course:  Acute on chronic hypoxic respiratory failure with fluid overload- secondary to pulmonary HTN, and severe COPD (A) severe pulm HTN - extensive work up reveals this is secondary to severe pulmonary artery hypertension which is possibly from underlying severe COPD  - 2-D echo done showing grade 1 diastolic dysfunction and severely elevated pulmonary artery pressure of 87 mmHg. - High resolution CT chest: moderate pericardial effusion vs pericardial thickening, possible cirrhosis and changes consistent with COPD - due to Pedal edema, Doppler lower extremity performed- negative for DVT  - Cardiology consulted for right heart catheterization which confirmed severely elevated PA pressures and therefore started on Revatio by cardiology  - pulmonary team following as well- auto-immune labs sent for PAH- ANA and Rh factor elevated- see labs below- needs Rheumatology f/u to determine the significant of this level of elevation -severe pedal edema has improved with diuretics- has diuresed about 8 L - to prevent recurrence of fluid overload, will now transition to daily oral low dose Lasix and K replacement - have asked her to do daily weights and f/u with PCP in 1 wk  to ensure her dose of Lasix is adequate for her  (B) COPD - based on PFTs, this is severe - will need O2 at all times - have started Spiriva & Dulera- cont Albuterol inhaler as needed - needs to continue to f/u with pulmonary as outpt  Polycythemia - suspected to be secondary to severe COPD- O2 initiated  Cirrhosis - noted on CT- possible cardiac cirrhosis - will need Hepatitis panel as outpt to evaluate for Hep C  Hypotension - SBP continuously in 90-100 range with a diastolic of 12-24M- this may be her norm - Continue Lasix with close monitoring of BP- ensure she is not becoming orthostatic by obtaining orthostatic  vitals in office   Procedures: 1/24ECHO : EF 25%, grade 1 diastolic dysfx, severe RV dilation, PAS 87 mmHg, mod pericardial effusion 1/26 Doppler legs >> negative 1/26 V/Q scan >> low probability for PE 1/30 Rt/Lt heart cath >> RA 9, RV 97/8/12, PA 104/49/72, PCW 18, CI 2.34, PVR 13.6 1/30 PFT >> FEV1 0.91 (40%), FEV1% 56, TLC 4.32 (88%), DLCO 38%, no BD   Post hosp ov 10/16/15 NP  rec Saline nasal rinses and gel work well for nasal congestion.  Order for humidifier for Oxygen Concentrator at home  Continue on Oxygen 2l/m , keep O2 sats >90%.  Call back if you need different prescriptions for dulera /Spiriva or need to change to Nebs to help with costs.  Follow up with Cardiology as planned     . 11/06/2015  Transition of care f/u [name] re: copd gold III/ chronic resp failure/ ? Cor pulmonale vs hepatopulmonary syndrome?  Chief Complaint  Patient presents with  . Follow-up    Pt states overall her breathing is doing well. She is currrently taking Dulera, but ins prefers advair.   doe = MMRC2 = can't walk a nl pace on a flat grade s sob even on 02 1.5 lpm sometimes increases to 2  Very confused with details of care / leg swelling resolved   No obvious day to day or daytime variability or assoc excess/ purulent sputum or mucus plugs  or cp or chest tightness, subjective wheeze or overt sinus or hb symptoms. No unusual exp hx or h/o childhood pna/ asthma or knowledge of premature birth.  Sleeping ok without nocturnal  or early am exacerbation  of respiratory  c/o's or need for noct saba. Also denies any obvious fluctuation of symptoms with weather or environmental changes or other aggravating or alleviating factors except as outlined above   Current Medications, Allergies, Complete Past Medical History, Past Surgical History, Family History, and Social History were reviewed in [employer] record.  ROS  The following are not active complaints unless bolded sore  throat, dysphagia, dental problems, itching, sneezing,  nasal congestion or excess/ purulent secretions, ear ache,   fever, chills, sweats, unintended wt loss, classically pleuritic or exertional cp, hemoptysis,  orthopnea pnd or leg swelling, presyncope, palpitations, abdominal pain, anorexia, nausea, vomiting, diarrhea  or change in bowel or bladder habits, change in stools or urine, dysuria,hematuria,  rash, arthralgias, visual complaints, headache, numbness, weakness or ataxia or problems with walking or coordination,  change in mood/affect or memory.                  Objective:   Physical Exam  04/06/2014        154    03/09/14 147 lb (66.679 kg)  02/22/14 150 lb 3.2 oz (68.13 kg)  02/22/14 147 lb  8 oz (66.906 kg)     Hoarse amb wf nad   HEENT mild turbinate edema.  Oropharynx no thrush or excess pnd or cobblestoning.  No JVD or cervical adenopathy. Mild accessory muscle hypertrophy. Trachea midline, nl thryroid. Chest was hyperinflated by percussion with diminished breath sounds and moderate increased exp time without wheeze. Hoover sign positive at mid inspiration. Regular rate and rhythm without murmur gallop or rub or increase P2 or edema.  Abd: no hsm, nl excursion. Ext warm without cyanosis or clubbing.        I personally reviewed images and agree with radiology impression as follows:  CT Chest    10/05/15  1. No findings to suggest interstitial lung disease. 2. Moderate amount of pericardial fluid and/or thickening, with slight distortion of normal cardiac contours. If there is any clinical concern for constrictive physiology, further evaluation with dedicated echocardiography should be considered. No pericardial calcification is noted at this time. 3. Small right pleural effusion with minimal subsegmental atelectasis in the right lower lobe. 4. Mild diffuse bronchial thickening with mild centrilobular emphysema; imaging findings suggestive of underlying COPD. 5.  Atherosclerosis, including three-vessel coronary artery disease. Please note that although the presence of coronary artery calcium documents the presence of coronary artery disease, the severity of this disease and any potential stenosis cannot be assessed on this non-gated CT examination. Assessment for potential risk factor modification, dietary therapy or pharmacologic therapy may be warranted, if clinically indicated. 6. Stigmata of cirrhosis. Trace volume of ascites. 7. Status post cholecystectomy    Assessment & Plan:

## 2015-11-06 NOTE — Patient Instructions (Addendum)
Try symbicort 160 Take 2 puffs first thing in am and then another 2 puffs about 12 hours later.  Continue spiriva for now pending follow up pfts  Wear the oxygen as much as you can at 1.5 lpm   Please schedule a follow up office visit in 4 weeks, ok to do wlh PFTs same day   Also need appt to see Dr Lake Bells

## 2015-11-06 NOTE — Assessment & Plan Note (Signed)
a. 09/2015 Echo: EF 55%, Gr 1 DD, D shaped IV septum, sev dil RV with mod reduced fxn, mod dil RA, RV-RA grad 4mHg, PASP 819mg, mod pericard eff w/o tamponade.  ? Whether this is cor pulmonale or more likely PAH or cirrhosis related > would like her to see McQuaid for second opinion but for now continue revatio/f/u cards also

## 2015-11-06 NOTE — Assessment & Plan Note (Addendum)
D/c on 02  10/09/15  - 11/06/2015  Walked 1.5 lpm  x 3 laps @ 185 ft each stopped due to End of study, nl pace, min sob, no desat    Adequate control on present rx, reviewed > no change in rx needed  = 24 h 1.5 lpm and eval for BEST FIT per [facility]

## 2015-11-06 NOTE — Assessment & Plan Note (Signed)
Quit smoking 02/2014 - PFT's  04/06/14      FEV1 1.36 (57 %) ratio 63  p 9 % improvement from saba with DLCO  51 % corrects to 71 % for alv volume   - PFT's  10/08/2015  FEV1 0.91 (40 %) ratio 56  p no % improvement from saba with DLCO 38 % corrects to 63 % for alv volume   Not clear at all why she's had such a dramatic 2 year decline in pfts calling into question the accuracy of the 2nd study because the 1st correlated very well with her baseline up until 07/2015 when her "allergies" acted up so clearly something's different now.   rec max rx with symbicort 160 2bid/ spiriva daily / prn saba/ 24 h 02 (see separate a/p) and f/u with full pfts in 4 weeks   I had an extended discussion with the patient reviewing all relevant studies completed to date and  lasting 25 minutes of a 40 minute transition of care office visit    Each maintenance medication was reviewed in detail including most importantly the difference between maintenance and prns and under what circumstances the prns are to be triggered using an action plan format that is not reflected in the computer generated alphabetically organized AVS.    Please see instructions for details which were reviewed in writing and the patient given a copy highlighting the part that I personally wrote and discussed at today's ov.

## 2015-12-03 ENCOUNTER — Ambulatory Visit (HOSPITAL_COMMUNITY)
Admission: RE | Admit: 2015-12-03 | Discharge: 2015-12-03 | Disposition: A | Discharge status: Home / Self Care | Payer: Medicare HMO | Source: Ambulatory Visit | Attending: Internal Medicine | Admitting: Internal Medicine

## 2015-12-03 ENCOUNTER — Ambulatory Visit (HOSPITAL_BASED_OUTPATIENT_CLINIC_OR_DEPARTMENT_OTHER)
Admission: RE | Admit: 2015-12-03 | Discharge: 2015-12-03 | Disposition: A | Discharge status: Home / Self Care | Payer: Medicare HMO | Source: Ambulatory Visit | Attending: Internal Medicine | Admitting: Internal Medicine

## 2015-12-03 VITALS — BP 112/58 | HR 99 | Wt 149.5 lb

## 2015-12-03 DIAGNOSIS — I519 Heart disease, unspecified: Secondary | ICD-10-CM

## 2015-12-03 DIAGNOSIS — J449 Chronic obstructive pulmonary disease, unspecified: Secondary | ICD-10-CM | POA: Insufficient documentation

## 2015-12-03 DIAGNOSIS — Z87891 Personal history of nicotine dependence: Secondary | ICD-10-CM | POA: Insufficient documentation

## 2015-12-03 DIAGNOSIS — I319 Disease of pericardium, unspecified: Secondary | ICD-10-CM

## 2015-12-03 DIAGNOSIS — I5033 Acute on chronic diastolic (congestive) heart failure: Secondary | ICD-10-CM | POA: Diagnosis not present

## 2015-12-03 DIAGNOSIS — I2721 Secondary pulmonary arterial hypertension: Secondary | ICD-10-CM

## 2015-12-03 DIAGNOSIS — I110 Hypertensive heart disease with heart failure: Secondary | ICD-10-CM | POA: Insufficient documentation

## 2015-12-03 DIAGNOSIS — I272 Other secondary pulmonary hypertension: Secondary | ICD-10-CM | POA: Insufficient documentation

## 2015-12-03 DIAGNOSIS — I071 Rheumatic tricuspid insufficiency: Secondary | ICD-10-CM | POA: Insufficient documentation

## 2015-12-03 DIAGNOSIS — Z7982 Long term (current) use of aspirin: Secondary | ICD-10-CM | POA: Diagnosis not present

## 2015-12-03 DIAGNOSIS — I313 Pericardial effusion (noninflammatory): Secondary | ICD-10-CM

## 2015-12-03 DIAGNOSIS — Z79899 Other long term (current) drug therapy: Secondary | ICD-10-CM | POA: Diagnosis not present

## 2015-12-03 DIAGNOSIS — I3139 Other pericardial effusion (noninflammatory): Secondary | ICD-10-CM

## 2015-12-03 DIAGNOSIS — I5189 Other ill-defined heart diseases: Secondary | ICD-10-CM

## 2015-12-03 LAB — BASIC METABOLIC PANEL
Anion gap: 12 (ref 5–15)
BUN: 12 mg/dL (ref 6–20)
CHLORIDE: 103 mmol/L (ref 101–111)
CO2: 27 mmol/L (ref 22–32)
CREATININE: 0.95 mg/dL (ref 0.44–1.00)
Calcium: 9.3 mg/dL (ref 8.9–10.3)
GFR calc Af Amer: >60 mL/min (ref >60)
GFR calc non Af Amer: >60 mL/min (ref >60)
Glucose, Bld: 87 mg/dL (ref 65–99)
Potassium: 4.1 mmol/L (ref 3.5–5.1)
SODIUM: 142 mmol/L (ref 135–145)

## 2015-12-03 MED ORDER — SILDENAFIL CITRATE 20 MG PO TABS: 40.0000 mg | ORAL_TABLET | Freq: Three times a day (TID) | ORAL | Status: DC

## 2015-12-03 NOTE — Progress Notes (Signed)
ADVANCED HF CLINIC NOTE  Patient ID: Linda Stevens, female   DOB: November 15, 1945, 70 y.o.   MRN: [id number] PCP: Primary Cardiologist: Dr Haroldine Laws  Pulmonary: Dr Melvyn Novas   HPI: 70 y/o woman with a h/o HTN, COPD (former smoker quit 2015), seasonal allergies, asthma, pulmonary HTN, and diastolic dysfxn.  Admitted 1/23 secondary to progressive DOE, hypoxia, and lower ext edema and has been found to have severe PAH. R/LHC 10/08/15 with normal coronaries, normal CO, and severe PAH PAP 104/49 with 13.6 WU. PAH well out of proportion to left-sided filling pressures and COPD. Started revatio 20 TID 10/08/15 with consideration for macitentan as outpatient. PFTs 10/05/15  FEV1 0.91 (40%)FVC 1.64 (55%), DLCO 8.80 (38%). Auto-immune and infectious serologies negative to date. Discharge weight 148 pounds.   Today she returns for HF follow up. Last visit sildenafil was added.  Since the last visit pulmonary evaluated with recs for max inhalers and 24 hour oxygen. Will repeat PFTs this week.   Overall doing much better. Now using O2 all the time. Able to walk back and forth across the yard without a problem. Still can't keep up with her husband walking. No edema, orthopnea or PND. Mild cough.  Denies syncope/presyncope. On 2 liters continuous. Weight at home 149-151 pounds.  Following low salt diet and limiting fluid intake to < 2 liters. Taking all medications.   Echo today EF 60-65% septum still flattened. RV size improved moderate HK. RVSP ~80. IVC small. Pericardial effusion essentially resolved    VQ scan low prob. CT chest mild COPD. Moderate pericardial effusion. 3v CAD. + cirrhosis. Hepatitis serologies negative.   PFTs  (FEV1 1.23 (52%) with FVC 2.01 (65%) and DLCO 51% in 7/15) PFT's 10/08/2015 FEV1 0.91 (40 %) ratio 56% no % improvement from saba with DLCO 38 % corrects to 63 % for alv volume   R/LHC 10/08/15 with normal coronaries, normal CO, and sever PAH with 13.6 WU.  Findings: Ao = 108/68  (86) LV = 102/10/11 RA = 9 RV = 97/8/12 PA = 104/49 (72) PCW = 18 Fick cardiac output/index = 3.96/2.34 PVR = 13.6 WU FA sat = 93% PA sat = 66%, 69%  PFTs 10/05/15 FVC 1.64 (55%), FEV1 0.91 (40%) DLCO 8.80 (38%). PFTs 04/06/14 FVC 2.01 (65%), FEV1 1.23 (52%) DLCO 12.25 (51%).  ROS: All systems negative except as listed in HPI, PMH and Problem List.  SH:  Social History   Social History  . Marital Status: Married    Spouse Name: N/A  . Number of Children: N/A  . Years of Education: N/A   Occupational History  . Not on file.   Social History Main Topics  . Smoking status: Former Smoker -- 1.00 packs/day for 40 years    Types: Cigarettes    Quit date: 02/22/2014  . Smokeless tobacco: Never Used  . Alcohol Use: No  . Drug Use: No  . Sexual Activity: No   Other Topics Concern  . Not on file   Social History Narrative   Married with 2 children.  Independent of ADLs.      Does not have a living will.   Would desire CPR but would not want prolonged life support if futile- husband aware.    FH:  Family History  Problem Relation Age of Onset  . Glaucoma Brother   . Vascular Disease Father     Died of complications from surgery  . Asthma Mother     Died of asthma attack  . Dementia  Mother     Past Medical History  Diagnosis Date  . Allergic rhinitis due to pollen   . Hypertensive heart disease   . H/O seasonal allergies   . Diastolic dysfunction     a. 02/2014 Echo: EF 65-70%, Gr 1 DD, RVH;  b. 09/2015 Echo: EF 55%, Gr 1 DD.  [name] Acute respiratory failure with hypoxia and hypercapnea 02/26/2014  . Right heart failure with reduced right ventricular function ([place])     a. 09/2015 Echo: EF 55%, Gr 1 DD, D shaped IV septum, sev dil RV with mod reduced fxn, mod dil RA, RV-RA grad 17mHg, PASP 867mg, mod pericard eff w/o tamponade.  . Pericardial effusion     a. 09/2015 Echo: Mod eff w/o tamponade.  . Severe Pulmonary Hypertension     a. 09/2015 Echo: PASP 8714m.  .  Marland KitchenOPD (chronic obstructive pulmonary disease) ([place] . Lower extremity edema   . History of tobacco abuse     a. Quit 2015.    Current Outpatient Prescriptions  Medication Sig Dispense Refill  . aspirin EC 81 MG EC tablet Take 1 tablet (81 mg total) by mouth daily. 30 tablet 0  . budesonide-formoterol (SYMBICORT) 160-4.5 MCG/ACT inhaler Take 2 puffs first thing in am and then another 2 puffs about 12 hours later. 1 Inhaler 11  . fluticasone (FLONASE) 50 MCG/ACT nasal spray PLACE 2 SPRAYS INTO BOTH NOSTRILS DAILY 16 g 5  . furosemide (LASIX) 20 MG tablet Take 1 tablet (20 mg total) by mouth daily. 30 tablet 6  . OXYGEN 1.5 to 2lpm 24/7    . potassium chloride (K-DUR) 10 MEQ tablet Take 1 tablet (10 mEq total) by mouth daily. 30 tablet 6  . sildenafil (REVATIO) 20 MG tablet Take 1 tablet (20 mg total) by mouth 3 (three) times daily. 10 tablet 0  . tiotropium (SPIRIVA) 18 MCG inhalation capsule Place 1 capsule (18 mcg total) into inhaler and inhale daily. 30 capsule 12   No current facility-administered medications for this encounter.    Filed Vitals:   12/03/15 1208  BP: 112/58  Pulse: 99  Weight: 149 lb 8 oz (67.813 kg)  SpO2: 96%    PHYSICAL EXAM:  General:  Well appearing. No resp difficulty HEENT: normal Neck: supple. JVP flat. Carotids 2+ bilaterally; no bruits. No lymphadenopathy or thryomegaly appreciated. Cor: PMI normal. Regular rate & rhythm. 2/6 TR loud P2 Lungs: decreased throughout. No wheezing Abdomen: soft, nontender, nondistended. No hepatosplenomegaly. No bruits or masses. Good bowel sounds. Extremities: no cyanosis, clubbing, rash, edema Neuro: alert & orientedx3, cranial nerves grossly intact. Moves all 4 extremities w/o difficulty. Affect pleasant.    ASSESSMENT & PLAN:  1. PAH with right heart failure --suspect combination of WHO Group I and III PAH --symptomatically much improved with diuresis, O2 and sildenafil. Echo reviewed today personally and  showed persistent severe PAH with RV strain.  --continue lasix --increase sildenafil to 40 tid. Can consider combination therapy with Macitentan --refer to pulmonary rehab.  --will need 6MW at next visit 2. Pericardial effusion --essentially resolved on echo today with treatment of HF and PAH 3. Chronic Respiratory failure - On 2 liters oxygen - Follows with Dr. WerMelvyn Novasepeat PFTs pending 4. COPD- Gold III  - on inhalers. Follows with Pulmoanry. As above 5. Polycythemia suspect due to chronic hypoxia  --now on home O2 6. Cirrhosis on CT --due to RHF. Hepatitis serologies are negative --no ascites on exam   Harshika Mago,MD 12:54 PM

## 2015-12-03 NOTE — Progress Notes (Signed)
Advanced Heart Failure Medication Review by a Pharmacist  Does the patient  feel that his/her medications are working for him/her?  yes  Has the patient been experiencing any side effects to the medications prescribed?  no  Does the patient measure his/her own blood pressure or blood glucose at home?  no   Does the patient have any problems obtaining medications due to transportation or finances?   Yes - Symbicort is $100/mo., she is going to the pulmonologist on Thursday and I told her to discuss this with them in case there are some patient assistance programs she can use to lower the cost to her  Understanding of regimen: good Understanding of indications: good Potential of compliance: good Patient understands to avoid NSAIDs. Patient understands to avoid decongestants.  Issues to address at subsequent visits: None   Pharmacist comments:  Linda Stevens is a pleasant 71 yo F presenting with her husband and without a medication list. She has a good understanding of her regimen including dosages. She reports excellent compliance with her regimen and did not have any specific medication-related questions or concerns for me at this time.   Linda Stevens. [name], PharmD, BCPS, CPP Clinical Pharmacist Pager: [phone] Phone: [phone] 12/03/2015 12:27 PM      Time with patient: 10 minutes Preparation and documentation time: 2 minutes Total time: 12 minutes

## 2015-12-03 NOTE — Progress Notes (Signed)
  Echocardiogram 2D Echocardiogram has been performed.  Linda Stevens 12/03/2015, 12:02 PM

## 2015-12-03 NOTE — Patient Instructions (Signed)
Increase Sildenafil to 40 mg (2 tabs) Three times a day   Labs today  You have been referred to Pulmonary Rehab, they will call you to schedule  Your physician recommends that you schedule a follow-up appointment in: 1 month

## 2015-12-06 ENCOUNTER — Encounter: Payer: Self-pay | Admitting: Internal Medicine

## 2015-12-06 ENCOUNTER — Ambulatory Visit (INDEPENDENT_AMBULATORY_CARE_PROVIDER_SITE_OTHER): Payer: Medicare HMO | Admitting: Internal Medicine

## 2015-12-06 ENCOUNTER — Ambulatory Visit (HOSPITAL_COMMUNITY)
Admission: RE | Admit: 2015-12-06 | Discharge: 2015-12-06 | Disposition: A | Discharge status: Home / Self Care | Payer: Medicare HMO | Source: Ambulatory Visit | Attending: Internal Medicine | Admitting: Internal Medicine

## 2015-12-06 VITALS — BP 122/60 | HR 90 | Ht 62.0 in | Wt 151.0 lb

## 2015-12-06 DIAGNOSIS — J449 Chronic obstructive pulmonary disease, unspecified: Secondary | ICD-10-CM

## 2015-12-06 DIAGNOSIS — J309 Allergic rhinitis, unspecified: Secondary | ICD-10-CM | POA: Diagnosis not present

## 2015-12-06 DIAGNOSIS — J9611 Chronic respiratory failure with hypoxia: Secondary | ICD-10-CM

## 2015-12-06 LAB — PULMONARY FUNCTION TEST
DL/VA % pred: 44 %
DL/VA: 2.11 ml/min/mmHg/L
DLCO UNC % PRED: 28 %
DLCO unc: 6.59 ml/min/mmHg
FEF 25-75 POST: 0.72 L/s
FEF 25-75 Pre: 0.52 L/sec
FEF2575-%Change-Post: 38 %
FEF2575-%PRED-PRE: 26 %
FEF2575-%Pred-Post: 36 %
FEV1-%Change-Post: 16 %
FEV1-%PRED-POST: 52 %
FEV1-%Pred-Pre: 44 %
FEV1-PRE: 1 L
FEV1-Post: 1.17 L
FEV1FVC-%CHANGE-POST: 7 %
FEV1FVC-%Pred-Pre: 75 %
FEV6-%CHANGE-POST: 10 %
FEV6-%PRED-PRE: 60 %
FEV6-%Pred-Post: 66 %
FEV6-POST: 1.89 L
FEV6-Pre: 1.71 L
FEV6FVC-%Change-Post: 1 %
FEV6FVC-%PRED-POST: 104 %
FEV6FVC-%Pred-Pre: 102 %
FVC-%Change-Post: 8 %
FVC-%PRED-PRE: 58 %
FVC-%Pred-Post: 63 %
FVC-POST: 1.89 L
FVC-PRE: 1.74 L
POST FEV6/FVC RATIO: 100 %
PRE FEV1/FVC RATIO: 58 %
Post FEV1/FVC ratio: 62 %
Pre FEV6/FVC Ratio: 99 %
RV % pred: 178 %
RV: 3.74 L
TLC % PRED: 113 %
TLC: 5.54 L

## 2015-12-06 MED ORDER — ALBUTEROL SULFATE (2.5 MG/3ML) 0.083% IN NEBU
2.5000 mg | INHALATION_SOLUTION | Freq: Once | RESPIRATORY_TRACT | Status: AC
  Administered 2015-12-06: 2.5 mg via RESPIRATORY_TRACT

## 2015-12-06 MED ORDER — PREDNISONE 10 MG PO TABS: ORAL_TABLET | ORAL | Status: DC

## 2015-12-06 MED ORDER — FLUTICASONE-SALMETEROL 115-21 MCG/ACT IN AERO: 2.0000 | INHALATION_SPRAY | Freq: Two times a day (BID) | RESPIRATORY_TRACT | Status: DC

## 2015-12-06 NOTE — Progress Notes (Addendum)
Subjective:    Patient ID: Linda Stevens, female    DOB: March 04, 1946  MRN: [id number]    Brief patient profile:  62 yowf quit smoking 02/22/14  with baseline no need for 02, able to chase grandchildren around some, gardening and all housework  using inhaler alb/proair  sev times a day  Plus maint singulair and acutely ill on 02/19/14:   Admit date: 02/22/2014  Discharge date: 02/27/2014 Discharge Diagnosis:   Principal Problem:  1. Acute hypoxic and hypercarbic respiratory failure secondary to asthma exacerbation and CAP (community acquired pneumonia) Active Problems:  TOBACCO ABUSE  HTN (hypertension)  Hypoxia  Hypokalemia  Acute asthma flare  Thrush  Acute respiratory failure with hypoxia and hypercapnea Discharge Condition: Stable.  Diet recommendation: Low sodium, heart healthy.  History of Present Illness:   Linda Stevens is an 70 y.o. female with a PMH of asthma treated with PRN bronchodilator therapy, ongoing tobacco abuse who presented to her PCP on 02/20/14 with a chief complaint of upper respiratory symptoms including cough productive of clear/white mucus, sinus pain and pressure, head congestion, chills but no documented fever and pain around the eyes. She was given a prednisone Dosepak which failed to alleviate her symptoms, and returned to her PCP 02/22/14 who instructed her to come to the ER for further evaluation secondary to hypoxia. Upon initial evaluation in the ED, the patient was noted to have a pulse oximetry of 86% on room air (increased to 92-94% on supplemental oxygen), mild tachycardia, and a chest x-ray concerning for right middle lobe community-acquired pneumonia.  Hospital Course by Problem:   Principal Problem:  Acute hypoxic and hypercapnic respiratory failure secondary to asthma exacerbation/community-acquired pneumonia  Treated with empiric azithromycin/Rocephin. Patient not producing sputum, so sputum culture not sent. S. Pneumo & Legionella neg.  D/C home on Ceftin through 03/02/14.  Treated with Solu-Medrol 60 mg IV every 12 hours, nebulized bronchodilator therapy, and Mucinex/antitussives. D/C home on Advair, prednisone dose pack, Tylenol #3 PRN cough.  F/U CXR clear. ABG showed some hypoxia/hypercarbia. D-dimer not elevated, ProBNP mildly elevated (given a dose of Lasix). 2-D echo relatively normal. CT chest negative except for an enlarged lymph node and RML bronchiectasis.  Send home on home oxygen with Linda Stevens to assess her periodically.  Pulmonology follow up arranged. Active Problems:  Diarrhea  C.diff negative.  Thrush  Likely from steroids. Treated with Nystatin. Tobacco abuse  Counseled.  Allergic rhinitis  Continue Singulair and Flonase. HTN (hypertension)  Continue HCTZ. Hypokalemia  Likely secondary to diuretic therapy.  Resolved with oral supplementation. Procedures:   2 D Echo 02/26/14:  Impressions:  - Technically difficult; vigorous LV function; grade 1 diastolic dysfunction; significant RVH and mildly reduced RV function; mild TR but signal inadequate to estimate pulmonary pressures.      03/09/2014 1st [facility] office visit/ Linda Stevens / post hosp consultation  Chief Complaint  Patient presents with  . Pulmonary Consult    Referred per Linda Stevens. [name] on 02/27/14 after having PNA. She c/o cough- prod with minimal white to clear sputum.  She states that she does not know if she is SOB or not.   d/c on 3lpm 24/7 and advair and about 50% back to baseline just mostly staying at home and no sob on 02 room t room, off prednisone x 1 week s relapse of symptoms and still not smoking  Now on advair with raspy dry cough day > night and worsening hoarseness rec  Try symbicort 160 Take 2 puffs first thing in am and then another 2 puffs about 12 hours later - if you don't like it, resume advair Work on inhaler technique:   Only use your albuterol (proair) as a rescue medication    04/06/2014 f/u  [name] re: GOLD II COPD/ maintaining off cigs and doing fine on advair 250 bid  Chief Complaint  Patient presents with  . Followup with PFT    Pt states that her cough is much better. She states still has occ cough in the am with minimal clear sputum.   Walked half mile s 02 s stopping s 02 desat one day prior to [place]   Not using any albuterol at all  rec Try off advair  Only use your albuterol as a rescue medication   F/u prn    Started having  Breathing problems in spring 2016 got better with one visit/ short rx ? Prednisone  Happened again in fall 2016 breathing got some better and then  leg swelling developed:  Admit date: 10/01/2015 Discharge date: 10/09/2015     Recommendations for Outpatient Follow-up:  1. Rheumatology follow up recommended for elevated ANA, RF factor 2. Needs Bmet in 1 wk to check renal function and K - new start on Lasix and K+ replacement 3. Due to new diagnosis of Cirrhosis, will need to have bloodwork to evaluate for underlying cirrhosis 4. Note baseline BP appears to be systolic of 74-259 and diastolic of 56-38 5. Will need 2 L O2 at all times  Discharge Condition: stable   Discharge Diagnoses:  Principal Problem:  Acute on chronic respiratory failure with hypoxia (HCC) Active Problems:  Severe COPD (chronic obstructive pulmonary disease) (HCC)  Severe Pulmonary Hypertension  Right heart failure with reduced right ventricular function (HCC)  Hypokalemia  Polycythemia  Lower extremity edema  Pericardial effusion  Acute respiratory failure with hypoxia and hypercapnea   History of present illness:  70 year old female with history of hypertension, COPD and asthma, history of diastolic dysfunction presented to the ED due to progressively worsening dyspnea with abdominal and lower extremity swelling of the past few weeks. Her O2 sats were in the low 80s on room air. She also reported orthopnea and PND for the past 2 days. Workup in the ED  showed hypokalemia and elevated BNP of 1159. Chest x-ray showed pulmonary vascular prominence.   Hospital Course:  Acute on chronic hypoxic respiratory failure with fluid overload- secondary to pulmonary HTN, and severe COPD (A) severe pulm HTN - extensive work up reveals this is secondary to severe pulmonary artery hypertension which is possibly from underlying severe COPD  - 2-D echo done showing grade 1 diastolic dysfunction and severely elevated pulmonary artery pressure of 87 mmHg. - High resolution CT chest: moderate pericardial effusion vs pericardial thickening, possible cirrhosis and changes consistent with COPD - due to Pedal edema, Doppler lower extremity performed- negative for DVT  - Cardiology consulted for right heart catheterization which confirmed severely elevated PA pressures and therefore started on Revatio by cardiology  - pulmonary team following as well- auto-immune labs sent for PAH- ANA and Rh factor elevated- see labs below- needs Rheumatology f/u to determine the significant of this level of elevation -severe pedal edema has improved with diuretics- has diuresed about 8 L - to prevent recurrence of fluid overload, will now transition to daily oral low dose Lasix and K replacement - have asked her to do daily weights and f/u with PCP in 1 wk  to ensure her dose of Lasix is adequate for her  (B) COPD - based on PFTs, this is severe - will need O2 at all times - have started Spiriva & Dulera- cont Albuterol inhaler as needed - needs to continue to f/u with pulmonary as outpt  Polycythemia - suspected to be secondary to severe COPD- O2 initiated  Cirrhosis - noted on CT- possible cardiac cirrhosis - will need Hepatitis panel as outpt to evaluate for Hep C  Hypotension - SBP continuously in 90-100 range with a diastolic of 24-09B- this may be her norm - Continue Lasix with close monitoring of BP- ensure she is not becoming orthostatic by obtaining orthostatic  vitals in office   Procedures: 1/24ECHO : EF 35%, grade 1 diastolic dysfx, severe RV dilation, PAS 87 mmHg, mod pericardial effusion 1/26 Doppler legs >> negative 1/26 V/Q scan >> low probability for PE 1/30 Rt/Lt heart cath >> RA 9, RV 97/8/12, PA 104/49/72, PCW 18, CI 2.34, PVR 13.6 1/30 PFT >> FEV1 0.91 (40%), FEV1% 56, TLC 4.32 (88%), DLCO 38%, no BD   Post hosp ov 10/16/15 NP  rec Saline nasal rinses and gel work well for nasal congestion.  Order for humidifier for Oxygen Concentrator at home  Continue on Oxygen 2l/m , keep O2 sats >90%.  Call back if you need different prescriptions for dulera /Spiriva or need to change to Nebs to help with costs.  Follow up with Cardiology as planned     . 11/06/2015  Transition of care f/u ov/Matheson Vandehei re: copd gold III/ chronic resp failure/ ? Cor pulmonale vs hepatopulmonary syndrome?  Chief Complaint  Patient presents with  . Follow-up    Pt states overall her breathing is doing well. She is currrently taking Dulera, but ins prefers advair.   doe = MMRC2 = can't walk a nl pace on a flat grade s sob even on 02 1.5 lpm sometimes increases to 2 Very confused with details of care / leg swelling resolved  rec Try symbicort 160 Take 2 puffs first thing in am and then another 2 puffs about 12 hours later.  Continue spiriva for now pending follow up pfts Wear the oxygen as much as you can at 1.5 lpm  Please schedule a follow up office visit in 4 weeks, ok to do wlh PFTs same day    12/06/2015  f/u ov/Daemian Gahm re: GOLD II / spiriva/symbiocrt maint  Chief Complaint  Patient presents with  . Follow-up    PFT done at [facility]. Pt c/o min cough with clear sputum- relates to allergies.   no change doe = MMRC2 = can't walk a nl pace on a flat grade s sob  Even on 02  Main new problem is nasal congestion/ neg resp to flonase/ irritated by 02 but can't use humidifier at low flow rate    No obvious day to day or daytime variability or assoc excess/ purulent sputum  or mucus plugs  or cp or chest tightness, subjective wheeze or overt   hb symptoms. No unusual exp hx or h/o childhood pna/ asthma or knowledge of premature birth.  Sleeping ok without nocturnal  or early am exacerbation  of respiratory  c/o's or need for noct saba. Also denies any obvious fluctuation of symptoms with weather or environmental changes or other aggravating or alleviating factors except as outlined above   Current Medications, Allergies, Complete Past Medical History, Past Surgical History, Family History, and Social History were reviewed in [employer]  record.  ROS  The following are not active complaints unless bolded sore throat, dysphagia, dental problems, itching, sneezing,  nasal congestion or excess/ purulent secretions, ear ache,   fever, chills, sweats, unintended wt loss, classically pleuritic or exertional cp, hemoptysis,  orthopnea pnd or leg swelling, presyncope, palpitations, abdominal pain, anorexia, nausea, vomiting, diarrhea  or change in bowel or bladder habits, change in stools or urine, dysuria,hematuria,  rash, arthralgias, visual complaints, headache, numbness, weakness or ataxia or problems with walking or coordination,  change in mood/affect or memory.                  Objective:   Physical Exam  04/06/2014        154 > 12/06/2015  148    03/09/14 147 lb (66.679 kg)  02/22/14 150 lb 3.2 oz (68.13 kg)  02/22/14 147 lb 8 oz (66.906 kg)     Hoarse amb wf nad   HEENT mild turbinate edema.  Oropharynx no thrush or excess pnd or cobblestoning.  No JVD or cervical adenopathy. Mild accessory muscle hypertrophy. Trachea midline, nl thryroid. Chest was hyperinflated by percussion with diminished breath sounds and moderate increased exp time without wheeze. Hoover sign positive at mid inspiration. Regular rate and rhythm without murmur gallop or rub or increase P2 or edema.  Abd: no hsm, nl excursion. Ext warm without cyanosis or clubbing.         I personally reviewed images and agree with radiology impression as follows:  HRCT Chest    10/05/15  1. No findings to suggest interstitial lung disease. 2. Moderate amount of pericardial fluid and/or thickening, with slight distortion of normal cardiac contours. If there is any clinical concern for constrictive physiology, further evaluation with dedicated echocardiography should be considered. No pericardial calcification is noted at this time. 3. Small right pleural effusion with minimal subsegmental atelectasis in the right lower lobe. 4. Mild diffuse bronchial thickening with mild centrilobular emphysema; imaging findings suggestive of underlying COPD. 5. Atherosclerosis, including three-vessel coronary artery disease. Please note that although the presence of coronary artery calcium documents the presence of coronary artery disease, the severity of this disease and any potential stenosis cannot be assessed on this non-gated CT examination. Assessment for potential risk factor modification, dietary therapy or pharmacologic therapy may be warranted, if clinically indicated. 6. Stigmata of cirrhosis. Trace volume of ascites. 7. Status post cholecystectomy    Assessment & Plan:

## 2015-12-06 NOTE — Patient Instructions (Signed)
Change symbicort to advair 115 Take 2 puffs first thing in am and then another 2 puffs about 12 hours later.  Work on inhaler technique:  relax and gently blow all the way out then take a nice smooth deep breath back in, triggering the inhaler at same time you start breathing in.  Hold for up to 5 seconds if you can. Blow out thru nose. Rinse and gargle with water when done     Prednisone 10 mg take  4 each am x 2 days,   2 each am x 2 days,  1 each am x 2 days and stop   I emphasized that nasal steroids have no immediate benefit in terms of improving symptoms.  To help them reached the target tissue, the patient should use Afrin two puffs every 12 hours applied one min before using the nasal steroids.  Afrin should be stopped after no more than 5 days.  If the symptoms worsen, Afrin can be restarted after 5 days off of therapy to prevent rebound congestion from overuse of Afrin.  I also emphasized that in no way are nasal steroids a concern in terms of "addiction".   Ok to take clariton or allegra as needed for allergies  Consult Dr Lake Bells next available re pulmonary hypertension

## 2015-12-07 ENCOUNTER — Encounter: Payer: Self-pay | Admitting: Internal Medicine

## 2015-12-07 DIAGNOSIS — J188 Other pneumonia, unspecified organism: Secondary | ICD-10-CM | POA: Diagnosis not present

## 2015-12-07 DIAGNOSIS — R05 Cough: Secondary | ICD-10-CM | POA: Diagnosis not present

## 2015-12-07 DIAGNOSIS — J449 Chronic obstructive pulmonary disease, unspecified: Secondary | ICD-10-CM | POA: Diagnosis not present

## 2015-12-07 DIAGNOSIS — J45909 Unspecified asthma, uncomplicated: Secondary | ICD-10-CM | POA: Diagnosis not present

## 2015-12-07 DIAGNOSIS — R0602 Shortness of breath: Secondary | ICD-10-CM | POA: Diagnosis not present

## 2015-12-07 DIAGNOSIS — J45901 Unspecified asthma with (acute) exacerbation: Secondary | ICD-10-CM | POA: Diagnosis not present

## 2015-12-07 DIAGNOSIS — R062 Wheezing: Secondary | ICD-10-CM | POA: Diagnosis not present

## 2015-12-07 DIAGNOSIS — J9601 Acute respiratory failure with hypoxia: Secondary | ICD-10-CM | POA: Diagnosis not present

## 2015-12-07 DIAGNOSIS — I10 Essential (primary) hypertension: Secondary | ICD-10-CM | POA: Diagnosis not present

## 2015-12-07 NOTE — Assessment & Plan Note (Signed)
Discharged   on 02  10/09/15 p admit  - 11/06/2015  Walked 1.5 lpm  x 3 laps @ 185 ft each stopped due to End of study, nl pace, min sob, no desat    Adequate control on present rx, reviewed > no change in rx needed  > consider pulmonary rehab

## 2015-12-07 NOTE — Assessment & Plan Note (Addendum)
Quit smoking 02/2014 - PFT's  04/06/14   FEV1 1.36 (57 % ) ratio 63  p 9 % improvement from saba with DLCO  51 % corrects to 71 % for alv volume   - PFT's  10/08/2015  FEV1 0.91 (40 % ) ratio 56  p no % improvement from saba with DLCO 38 % corrects to 63 % for alv volume   - PFT's  12/06/2015  FEV1 1.17  (52 % ) ratio 62  p 16 % improvement from saba p no rx prior to study with DLCO  28 % corrects to 44 % for   alv volume    Her dlco is disproportionate to reduction in FEV1 and probably explains why she is 02 dep with technically only a GOLD II severity copd by spirometric criteria and her HRCT doesn't really give [place] much additional info but there is no sign ILD and nothing further to gain by additonal bronchodilators so main rx is 02 and consider rehab.  - The proper method of use, as well as anticipated side effects, of a metered-dose inhaler are discussed and demonstrated to the patient. Improved effectiveness after extensive coaching during this visit to a level of approximately 75 % from a baseline of 50 %   I had an extended discussion with the patient and husband and daughter [name]  reviewing all relevant studies completed to date and  lasting 15 to 20 minutes of a 25 minute visit    Formulary restrictions will be an ongoing challenge for the forseable future and I would be happy to pick an alternative if the pt will first  provide me a list of them but pt  will need to return here for training for any new device that is required eg dpi vs hfa vs respimat.    In meantime we can always provide samples so the patient never runs out of any needed respiratory medications.   For now her insurance is insisting on trial of advair before they'll pay for symbicort so rec advair 115 2bid with other option BREO since she's already on dpi in form of spiriva but alternative is ANORO and leave off ICS altogether to be determined at f/u.  Each maintenance medication was reviewed in detail including most  importantly the difference between maintenance and prns and under what circumstances the prns are to be triggered using an action plan format that is not reflected in the computer generated alphabetically organized AVS.    Please see instructions for details which were reviewed in writing and the patient given a copy highlighting the part that I personally wrote and discussed at today's ov.   Will be referring to DR [facility] for opinion re gas exchange/?cor pulmonale vs hepatopulmonary syndrome from cirrhosis vs  PAH

## 2015-12-07 NOTE — Assessment & Plan Note (Signed)
I emphasized that nasal steroids have no immediate benefit in terms of improving symptoms.  To help them reached the target tissue, the patient should use Afrin two puffs every 12 hours applied one min before using the nasal steroids.  Afrin should be stopped after no more than 5 days.  If the symptoms worsen, Afrin can be restarted after 5 days off of therapy to prevent rebound congestion from overuse of Afrin.  I also emphasized that in no way are nasal steroids a concern in terms of "addiction".   See avs

## 2015-12-11 ENCOUNTER — Telehealth (HOSPITAL_COMMUNITY): Payer: Self-pay | Admitting: Vascular Surgery

## 2015-12-11 NOTE — Telephone Encounter (Signed)
Spoke w/pt, insurance has denied sildenafil 40 mg TID, they will only cover 20 mg TID.  Will discuss w/Dr Bensimhon wheter he wants to change her to Adcirca or not and call her back.  She currently has enough meds to last over the weekend

## 2015-12-11 NOTE — Telephone Encounter (Signed)
Pt needs auth for sildenafil she is running out she will be out by [employer]

## 2015-12-12 NOTE — Telephone Encounter (Signed)
pls switch to adcirca 40

## 2015-12-14 NOTE — Telephone Encounter (Signed)
Have sent referral to [facility] for Adcirca, pt is aware and states she has enough Sildenafil for about 5 more days

## 2015-12-17 ENCOUNTER — Telehealth: Payer: Self-pay | Admitting: Pulmonary Disease

## 2015-12-17 NOTE — Telephone Encounter (Signed)
Spoke with pt. She is needing samples of Sildenafil. Dr. Dianna Limbo office prescribes this for her. They don't have samples of this and told her to call us. We don't have samples either. She will call them back and see what they want her to do. Nothing further was needed.

## 2015-12-17 NOTE — Telephone Encounter (Signed)
Marked as urgent as patient said she is out of med and will be gone after 10 am and cannot be reached.

## 2015-12-31 ENCOUNTER — Other Ambulatory Visit: Payer: Self-pay | Admitting: Family Medicine

## 2016-01-01 ENCOUNTER — Encounter (HOSPITAL_COMMUNITY): Payer: Medicare HMO | Admitting: Internal Medicine

## 2016-01-03 ENCOUNTER — Telehealth (HOSPITAL_COMMUNITY): Payer: Self-pay | Admitting: *Deleted

## 2016-01-03 NOTE — Telephone Encounter (Signed)
Pt called and stated that adcirca would cost her $600.  I spoke with Nira Conn and Doroteo Bradford who are working to get patient some type of patient assistance. Nira Conn will call patient today to follow up

## 2016-01-03 NOTE — Telephone Encounter (Signed)
Spoke w/pt gave her numbers for [employer] and [employer], she will contact them and let me know if they are able to help or not

## 2016-01-04 NOTE — Telephone Encounter (Signed)
Per both, pt companies are out of funding and can not help her.  Have contacted Butch Penny w/[facility], she will check into [employer] for pt and call me back on Mon, have provided pt w/1 week of samples

## 2016-01-06 DIAGNOSIS — R05 Cough: Secondary | ICD-10-CM | POA: Diagnosis not present

## 2016-01-06 DIAGNOSIS — J45909 Unspecified asthma, uncomplicated: Secondary | ICD-10-CM | POA: Diagnosis not present

## 2016-01-06 DIAGNOSIS — R0602 Shortness of breath: Secondary | ICD-10-CM | POA: Diagnosis not present

## 2016-01-06 DIAGNOSIS — I10 Essential (primary) hypertension: Secondary | ICD-10-CM | POA: Diagnosis not present

## 2016-01-06 DIAGNOSIS — J45901 Unspecified asthma with (acute) exacerbation: Secondary | ICD-10-CM | POA: Diagnosis not present

## 2016-01-06 DIAGNOSIS — J9601 Acute respiratory failure with hypoxia: Secondary | ICD-10-CM | POA: Diagnosis not present

## 2016-01-06 DIAGNOSIS — R062 Wheezing: Secondary | ICD-10-CM | POA: Diagnosis not present

## 2016-01-06 DIAGNOSIS — J449 Chronic obstructive pulmonary disease, unspecified: Secondary | ICD-10-CM | POA: Diagnosis not present

## 2016-01-06 DIAGNOSIS — J188 Other pneumonia, unspecified organism: Secondary | ICD-10-CM | POA: Diagnosis not present

## 2016-01-08 NOTE — Telephone Encounter (Signed)
[facility] has reached out to pt and sent her financial asst paperwork to complete

## 2016-01-15 NOTE — Telephone Encounter (Signed)
Pt approved for financial asst w/[facility] so she will be getting med from them

## 2016-01-17 ENCOUNTER — Encounter: Payer: Self-pay | Admitting: Pulmonary Disease

## 2016-01-17 ENCOUNTER — Ambulatory Visit (INDEPENDENT_AMBULATORY_CARE_PROVIDER_SITE_OTHER): Payer: Medicare HMO | Admitting: Pulmonary Disease

## 2016-01-17 VITALS — BP 108/62 | HR 94 | Ht 63.0 in | Wt 150.0 lb

## 2016-01-17 DIAGNOSIS — J9611 Chronic respiratory failure with hypoxia: Secondary | ICD-10-CM

## 2016-01-17 DIAGNOSIS — J449 Chronic obstructive pulmonary disease, unspecified: Secondary | ICD-10-CM | POA: Diagnosis not present

## 2016-01-17 DIAGNOSIS — J309 Allergic rhinitis, unspecified: Secondary | ICD-10-CM | POA: Diagnosis not present

## 2016-01-17 DIAGNOSIS — I272 Other secondary pulmonary hypertension: Secondary | ICD-10-CM | POA: Diagnosis not present

## 2016-01-17 DIAGNOSIS — Z23 Encounter for immunization: Secondary | ICD-10-CM | POA: Diagnosis not present

## 2016-01-17 MED ORDER — AZITHROMYCIN 250 MG PO TABS: ORAL_TABLET | ORAL | Status: DC

## 2016-01-17 MED ORDER — PREDNISONE 10 MG PO TABS
ORAL_TABLET | ORAL | Status: DC
Start: 2016-01-17 — End: 2016-02-13

## 2016-01-17 NOTE — Patient Instructions (Signed)
Keep taking your medications as you're doing including the Spiriva and Advair Keep using her oxygen regularly Take the prednisone taper as prescribed Take the Z-Pak as prescribed We will see you back in 3 months with Dr. Melvyn Novas or as previously prescribed

## 2016-01-17 NOTE — Progress Notes (Signed)
Subjective:    Patient ID: Linda Stevens, female    DOB: 1946-01-17, 70 y.o.   MRN: [id number]  Synopsis: Referred in 2017 for evaluation of pulmonary hypertension, COPD and cirrhosis. Right heart catheterization performed in 2017 confirmed a diagnosis of pulmonary hypertension so she was started on sildenafil. 1/24ECHO : EF 03%, grade 1 diastolic dysfx, severe RV dilation, PAS 87 mmHg, mod pericardial effusion 1/26 Doppler legs >> negative 1/26 V/Q scan >> low probability for PE 1/30 Rt/Lt heart cath >> RA 9, RV 97/8/12, PA 104/49/72, PCW 18, CI 2.34, PVR 13.6 - PFT's 04/06/14 FEV1 1.36 (57 % ) ratio 63 p 9 % improvement from saba with DLCO 51 % corrects to 71 % for alv volume  - PFT's 10/08/2015 FEV1 0.91 (40 % ) ratio 56 p no % improvement from saba with DLCO 38 % corrects to 63 % for alv volume  - PFT's 12/06/2015 FEV1 1.17 (52 % ) ratio 62 p 16 % improvement from saba p no rx prior to study with DLCO 28 % corrects to 44 % for alv volume She smoked < 1 ppd for over 40 years and quit in 2015.    HPI Chief Complaint  Patient presents with  . Advice Only    Referred by Dr. Melvyn Novas for Pulmonary Hypertension; breathing is not doing well this week.  sinus pressure.  coughing up clear/white mucus.    [name] was referred to me for a second pulmonary opinion on her respiratory problems.  She has been having dypspnea since 2015.  She thinks that this came on gradually.  She was discharged on oxygen at that time and only stayed on it for about three weeks. She said that the medicines she was prescribed at that time "fixed her" and she felt well.  She started feeling dyspneic again in 2017.  She said that she started having fluid building up on her legs and she felt like "her blood pressure [was] dropping".  The edema progressed and showed up in her belly with swelling, she ended up going to the ER after being seen by her PCP.  She had gained about 20 pounds of fluid at that  point.  She required aggressive diuresis in hospital and this really helped.  She was never really able to come off of the oxygen at that point.    Her husband notes that she is not very compliant with her oxygen.  She notices that it is lower when she has been up doing things and not wearing her oxygen.  They have noticed that her oxygen saturation will drop to 88% on RA when she is walking around.   She has not participated in pulmonary rehab because it was too expensive.  She is trying to exercise more at home.  She has walked as much as 1/2 mile without stopping.    She has been treated with revatio, but this was recently changed to Deport.   She says that she has had severe allergies for years which was associated with wheezing, bronchitis and occasional dyspnea.  This was treated with immunotherapy.    In the last few days she has been having more eye swelling, chest congestion.  In general she feels weaker and feels facial tenderness on the right side, blowing out bloody mucus.   She gets "flare ups" of her asthma/COPD fairly regularly, at least 2 times per year.  This is usually in the Spring and Fall.    She is compliant  with her Spiriva and Advair.  She hasn't used her rescue inhaler much.     Past Medical History  Diagnosis Date  . Allergic rhinitis due to pollen   . Hypertensive heart disease   . H/O seasonal allergies   . Diastolic dysfunction     a. 02/2014 Echo: EF 65-70%, Gr 1 DD, RVH;  b. 09/2015 Echo: EF 55%, Gr 1 DD.  [name] Acute respiratory failure with hypoxia and hypercapnea 02/26/2014  . Right heart failure with reduced right ventricular function ([place])     a. 09/2015 Echo: EF 55%, Gr 1 DD, D shaped IV septum, sev dil RV with mod reduced fxn, mod dil RA, RV-RA grad 85mHg, PASP 840mg, mod pericard eff w/o tamponade.  . Pericardial effusion     a. 09/2015 Echo: Mod eff w/o tamponade.  . Severe Pulmonary Hypertension     a. 09/2015 Echo: PASP 8743m.  . CMarland KitchenPD (chronic  obstructive pulmonary disease) ([facility] . Lower extremity edema   . History of tobacco abuse     a. Quit 2015.      Review of Systems  Constitutional: Positive for fatigue. Negative for fever, chills, diaphoresis and appetite change.  HENT: Positive for postnasal drip, rhinorrhea and sinus pressure. Negative for congestion, hearing loss, nosebleeds, sore throat and trouble swallowing.   Eyes: Negative for discharge, redness and visual disturbance.  Respiratory: Positive for cough and shortness of breath. Negative for choking, chest tightness and wheezing.   Cardiovascular: Positive for leg swelling. Negative for chest pain and palpitations.  Gastrointestinal: Negative for nausea, abdominal pain, diarrhea, constipation, blood in stool and abdominal distention.  Genitourinary: Negative for dysuria, frequency and hematuria.  Musculoskeletal: Negative for myalgias, joint swelling, arthralgias and neck stiffness.  Skin: Negative for color change, pallor and rash.  Neurological: Negative for dizziness, seizures, facial asymmetry, speech difficulty, light-headedness, numbness and headaches.  Hematological: Negative for adenopathy. Does not bruise/bleed easily.       Objective:   Physical Exam Filed Vitals:   01/17/16 1613  BP: 108/62  Pulse: 94  Height: _0  (1.6 m)  Weight: 150 lb (68.04 kg)  SpO2: 94%  2L [place]  Gen: well appearing, no acute distress HENT: NCAT, OP clear, neck supple without masses Eyes: PERRL, EOMi Lymph: no cervical lymphadenopathy PULM: CTA B CV: RRR, no mgr, no JVD GI: BS+, soft, nontender, no hsm Derm: no rash or skin breakdown MSK: normal bulk and tone Neuro: A&Ox4, CN II-XII intact, strength 5/5 in all 4 extremities Psyche: normal mood and affect   Images from her CT chest personally reviewed> Mild centrilobular emphysema, increased pericardial thickening, nodular contour of the liver  BMET    Component Value Date/Time   NA 142 12/03/2015 1316   K  4.1 12/03/2015 1316   CL 103 12/03/2015 1316   CO2 27 12/03/2015 1316   GLUCOSE 87 12/03/2015 1316   BUN 12 12/03/2015 1316   CREATININE 0.95 12/03/2015 1316   CALCIUM 9.3 12/03/2015 1316   GFRNONAA >60 12/03/2015 1316   GFRAA >60 12/03/2015 1316          Assessment & Plan:  COPD GOLD II criteria but 02 dep 24/7  It is difficult to get clear history here as she reports repeated "bronchitis" episodes a few times per year treated by her PCP but these may just be allergic rhinitis flares.  She reports another one of these episodes today but she has no wheezing (mostly sinus complaints).  So I agree she  fits more with a GOLD Grade B category right now.  Plan: Continue bronchodilator treatment as prescribed by Dr. Melvyn Novas  Severe Pulmonary Hypertension Clearly her COPD contributes to her pulmonary hypertension but I agree that the severity of her pulmonary pressures suggests that there is pulmonary arteriopathy (ie WHO group 1 disease).  I also wonder if the cirrhosis seen on her CT chest is contributing to this as well as it sounds she had findings worrisome for elevated portal pressure prior to her admission in January.   Plan: Continue Adcirca Given concern for WHO group 1 disease, would add an endothelian receptor antagonist, but will wait until the Adcirca is initiated Would consider a repeat RHC this year with a pulmonary vein pressure, but could be done after initiation of a second agent I explained to her today at length that she must use oxygen continuously not as needed  Chronic respiratory failure with hypoxia ([place]) Her last oxygen desaturation test in our office was normal but she notes desaturation with activity on RA at home.  I recommended she continue O2 use at this time with exertion.  Allergic rhinitis Flaring up right now  Plan Prednisone taper and Zpack  Use flonase     Current outpatient prescriptions:  .  aspirin EC 81 MG EC tablet, Take 1 tablet (81 mg  total) by mouth daily., Disp: 30 tablet, Rfl: 0 .  fluticasone (FLONASE) 50 MCG/ACT nasal spray, SHAKE WELL AND USE 2 SPRAYS IN EACH NOSTRIL DAILY, Disp: 16 g, Rfl: 4 .  fluticasone-salmeterol (ADVAIR HFA) 115-21 MCG/ACT inhaler, Inhale 2 puffs into the lungs 2 (two) times daily., Disp: 1 Inhaler, Rfl: 12 .  furosemide (LASIX) 20 MG tablet, Take 1 tablet (20 mg total) by mouth daily., Disp: 30 tablet, Rfl: 6 .  OXYGEN, 1.5 to 2lpm 24/7, Disp: , Rfl:  .  potassium chloride (K-DUR) 10 MEQ tablet, Take 1 tablet (10 mEq total) by mouth daily., Disp: 30 tablet, Rfl: 6 .  sildenafil (REVATIO) 20 MG tablet, Take 2 tablets (40 mg total) by mouth 3 (three) times daily., Disp: 180 tablet, Rfl: 3 .  tiotropium (SPIRIVA) 18 MCG inhalation capsule, Place 1 capsule (18 mcg total) into inhaler and inhale daily., Disp: 30 capsule, Rfl: 12 .  azithromycin (ZITHROMAX Z-PAK) 250 MG tablet, Take 572m on day one and then 2588mdaily for 4 days, Disp: 6 each, Rfl: 0 .  predniSONE (DELTASONE) 10 MG tablet, Take 4070mo daily for 3 days, then take 5m4m daily for 3 days, then take 20mg51mdaily for two days, then take 10mg 2maily for 2 days, Disp: 27 tablet, Rfl: 0

## 2016-01-17 NOTE — Assessment & Plan Note (Addendum)
It is difficult to get clear history here as she reports repeated "bronchitis" episodes a few times per year treated by her PCP but these may just be allergic rhinitis flares.  She reports another one of these episodes today but she has no wheezing (mostly sinus complaints).  So I agree she fits more with a GOLD Grade B category right now.  Plan: Continue bronchodilator treatment as prescribed by Dr. Melvyn Novas

## 2016-01-17 NOTE — Assessment & Plan Note (Signed)
Flaring up right now  Plan Prednisone taper and Zpack  Use flonase

## 2016-01-17 NOTE — Assessment & Plan Note (Addendum)
Clearly her COPD contributes to her pulmonary hypertension but I agree that the severity of her pulmonary pressures suggests that there is pulmonary arteriopathy (ie WHO group 1 disease).  I also wonder if the cirrhosis seen on her CT chest is contributing to this as well as it sounds she had findings worrisome for elevated portal pressure prior to her admission in January.   Plan: Continue Adcirca Given concern for WHO group 1 disease, would add an endothelian receptor antagonist, but will wait until the Adcirca is initiated Would consider a repeat RHC this year with a pulmonary vein pressure, but could be done after initiation of a second agent I explained to her today at length that she must use oxygen continuously not as needed

## 2016-01-17 NOTE — Progress Notes (Signed)
   Subjective:    Patient ID: REMONIA OTTE, female    DOB: December 21, 1945, 70 y.o.   MRN: [id number]  HPI    Review of Systems  Constitutional: Negative for fever, chills and unexpected weight change.  HENT: Positive for congestion, nosebleeds, postnasal drip, sinus pressure and sneezing. Negative for dental problem, ear pain, rhinorrhea, sore throat, trouble swallowing and voice change.   Eyes: Negative for visual disturbance.  Respiratory: Positive for cough and shortness of breath. Negative for choking.   Cardiovascular: Negative for chest pain and leg swelling.  Gastrointestinal: Negative for vomiting, abdominal pain and diarrhea.  Genitourinary: Negative for difficulty urinating.  Musculoskeletal: Negative for arthralgias.  Skin: Negative for rash.  Neurological: Negative for tremors, syncope and headaches.  Hematological: Does not bruise/bleed easily.       Objective:   Physical Exam        Assessment & Plan:

## 2016-01-17 NOTE — Assessment & Plan Note (Signed)
Her last oxygen desaturation test in our office was normal but she notes desaturation with activity on RA at home.  I recommended she continue O2 use at this time with exertion.

## 2016-01-18 ENCOUNTER — Ambulatory Visit (HOSPITAL_COMMUNITY)
Admission: RE | Admit: 2016-01-18 | Discharge: 2016-01-18 | Disposition: A | Discharge status: Home / Self Care | Payer: Medicare HMO | Source: Ambulatory Visit | Attending: Internal Medicine | Admitting: Internal Medicine

## 2016-01-18 VITALS — BP 134/70 | HR 124 | Wt 152.0 lb

## 2016-01-18 DIAGNOSIS — D751 Secondary polycythemia: Secondary | ICD-10-CM | POA: Insufficient documentation

## 2016-01-18 DIAGNOSIS — Z79899 Other long term (current) drug therapy: Secondary | ICD-10-CM | POA: Diagnosis not present

## 2016-01-18 DIAGNOSIS — I50810 Right heart failure, unspecified: Secondary | ICD-10-CM

## 2016-01-18 DIAGNOSIS — J9610 Chronic respiratory failure, unspecified whether with hypoxia or hypercapnia: Secondary | ICD-10-CM | POA: Insufficient documentation

## 2016-01-18 DIAGNOSIS — Z87891 Personal history of nicotine dependence: Secondary | ICD-10-CM | POA: Diagnosis not present

## 2016-01-18 DIAGNOSIS — I509 Heart failure, unspecified: Secondary | ICD-10-CM | POA: Diagnosis not present

## 2016-01-18 DIAGNOSIS — I110 Hypertensive heart disease with heart failure: Secondary | ICD-10-CM | POA: Diagnosis not present

## 2016-01-18 DIAGNOSIS — K7460 Unspecified cirrhosis of liver: Secondary | ICD-10-CM | POA: Insufficient documentation

## 2016-01-18 DIAGNOSIS — Z9981 Dependence on supplemental oxygen: Secondary | ICD-10-CM | POA: Insufficient documentation

## 2016-01-18 DIAGNOSIS — Z7982 Long term (current) use of aspirin: Secondary | ICD-10-CM | POA: Diagnosis not present

## 2016-01-18 DIAGNOSIS — Z825 Family history of asthma and other chronic lower respiratory diseases: Secondary | ICD-10-CM | POA: Diagnosis not present

## 2016-01-18 DIAGNOSIS — I272 Other secondary pulmonary hypertension: Secondary | ICD-10-CM | POA: Diagnosis not present

## 2016-01-18 DIAGNOSIS — J449 Chronic obstructive pulmonary disease, unspecified: Secondary | ICD-10-CM | POA: Diagnosis not present

## 2016-01-18 NOTE — Progress Notes (Signed)
Patient ID: Linda Stevens, female   DOB: December 25, 1945, 70 y.o.   MRN: [id number]  ADVANCED HF CLINIC NOTE  Patient ID: Linda Stevens, female   DOB: 05/03/1946, 70 y.o.   MRN: [id number] PCP: Primary Cardiologist: Dr Haroldine Laws  Pulmonary: Dr Melvyn Novas   HPI: 70 y/o woman with a h/o HTN, COPD (former smoker quit 2015), seasonal allergies, asthma, pulmonary HTN, and diastolic dysfxn.  Admitted 1/23 secondary to progressive DOE, hypoxia, and lower ext edema and has been found to have severe PAH. R/LHC 10/08/15 with normal coronaries, normal CO, and severe PAH PAP 104/49 with 13.6 WU. PAH well out of proportion to left-sided filling pressures and COPD. Started revatio 20 TID 10/08/15 with consideration for macitentan as outpatient. PFTs 10/05/15  FEV1 0.91 (40%)FVC 1.64 (55%), DLCO 8.80 (38%). Auto-immune and infectious serologies negative to date. Discharge weight 148 pounds.   Today she returns for HF follow up. Last visit sildenafil was added.  Since the last visit pulmonary evaluated with recs for max inhalers and 24 hour oxygen. Will repeat PFTs this week.   Returns for routine f/u: Saw Dr. [place] Bells yesterday for sinus infection started steroids and antibiotics. Feels rundown. At last visit increased sildenafil 40 tid but she could not obtain. Now about to get Adcirca 40 but hasn't received yet. Now using O2 all the time. Able to walk back and forth across the yard without a problem. No edema, orthopnea or PND.  Denies syncope/presyncope. On 2 liters continuous. Weight stable .  Following low salt diet and limiting fluid intake to < 2 liters. Taking all medications. Bought a TM and walking 1/2 mile twice a day.   Echo 4/17 EF 60-65% septum still flattened. RV size improved moderate HK. RVSP ~80. IVC small. Pericardial effusion essentially resolved    VQ scan low prob. CT chest mild COPD. Moderate pericardial effusion. 3v CAD. + cirrhosis. Hepatitis serologies negative.   PFTs  (FEV1 1.23 (52%)  with FVC 2.01 (65%) and DLCO 51% in 7/15) PFT's 10/08/2015 FEV1 0.91 (40 %) ratio 56% no % improvement from saba with DLCO 38 % corrects to 63 % for alv volume   R/LHC 10/08/15 with normal coronaries, normal CO, and sever PAH with 13.6 WU.  Findings: Ao = 108/68 (86) LV = 102/10/11 RA = 9 RV = 97/8/12 PA = 104/49 (72) PCW = 18 Fick cardiac output/index = 3.96/2.34 PVR = 13.6 WU FA sat = 93% PA sat = 66%, 69%  PFTs 10/05/15 FVC 1.64 (55%), FEV1 0.91 (40%) DLCO 8.80 (38%). PFTs 04/06/14 FVC 2.01 (65%), FEV1 1.23 (52%) DLCO 12.25 (51%).  ROS: All systems negative except as listed in HPI, PMH and Problem List.  SH:  Social History   Social History  . Marital Status: Married    Spouse Name: N/A  . Number of Children: N/A  . Years of Education: N/A   Occupational History  . Not on file.   Social History Main Topics  . Smoking status: Former Smoker -- 1.00 packs/day for 40 years    Types: Cigarettes    Quit date: 02/22/2014  . Smokeless tobacco: Never Used  . Alcohol Use: No  . Drug Use: No  . Sexual Activity: No   Other Topics Concern  . Not on file   Social History Narrative   Married with 2 children.  Independent of ADLs.      Does not have a living will.   Would desire CPR but would not want prolonged life support if  futile- husband aware.    FH:  Family History  Problem Relation Age of Onset  . Glaucoma Brother   . Vascular Disease Father     Died of complications from surgery  . Asthma Mother     Died of asthma attack  . Dementia Mother     Past Medical History  Diagnosis Date  . Allergic rhinitis due to pollen   . Hypertensive heart disease   . H/O seasonal allergies   . Diastolic dysfunction     a. 02/2014 Echo: EF 65-70%, Gr 1 DD, RVH;  b. 09/2015 Echo: EF 55%, Gr 1 DD.  [name] Acute respiratory failure with hypoxia and hypercapnea 02/26/2014  . Right heart failure with reduced right ventricular function ([place])     a. 09/2015 Echo: EF 55%, Gr 1 DD, D  shaped IV septum, sev dil RV with mod reduced fxn, mod dil RA, RV-RA grad 56mHg, PASP 853mg, mod pericard eff w/o tamponade.  . Pericardial effusion     a. 09/2015 Echo: Mod eff w/o tamponade.  . Severe Pulmonary Hypertension     a. 09/2015 Echo: PASP 871m.  . CMarland KitchenPD (chronic obstructive pulmonary disease) ([facility] . Lower extremity edema   . History of tobacco abuse     a. Quit 2015.    Current Outpatient Prescriptions  Medication Sig Dispense Refill  . aspirin EC 81 MG EC tablet Take 1 tablet (81 mg total) by mouth daily. 30 tablet 0  . azithromycin (ZITHROMAX Z-PAK) 250 MG tablet Take 500m30m day one and then 250mg90mly for 4 days 6 each 0  . fluticasone (FLONASE) 50 MCG/ACT nasal spray SHAKE WELL AND USE 2 SPRAYS IN EACH NOSTRIL DAILY 16 g 4  . fluticasone-salmeterol (ADVAIR HFA) 115-21 MCG/ACT inhaler Inhale 2 puffs into the lungs 2 (two) times daily. 1 Inhaler 12  . furosemide (LASIX) 20 MG tablet Take 1 tablet (20 mg total) by mouth daily. 30 tablet 6  . OXYGEN 1.5 to 2lpm 24/7    . potassium chloride (K-DUR) 10 MEQ tablet Take 1 tablet (10 mEq total) by mouth daily. 30 tablet 6  . predniSONE (DELTASONE) 10 MG tablet Take 40mg 49maily for 3 days, then take 30mg p5mily for 3 days, then take 20mg po31mly for two days, then take 10mg po 18my for 2 days 27 tablet 0  . sildenafil (REVATIO) 20 MG tablet Take 2 tablets (40 mg total) by mouth 3 (three) times daily. 180 tablet 3  . tiotropium (SPIRIVA) 18 MCG inhalation capsule Place 1 capsule (18 mcg total) into inhaler and inhale daily. 30 capsule 12   No current facility-administered medications for this encounter.    Filed Vitals:   01/18/16 1447  BP: 134/70  Pulse: 124  Weight: 152 lb (68.947 kg)  SpO2: 91%    PHYSICAL EXAM:  General:  Well appearing. No resp difficulty HEENT: normal Neck: supple. JVP flat. Carotids 2+ bilaterally; no bruits. No lymphadenopathy or thryomegaly appreciated. Cor: PMI normal. Regular rate  & rhythm. 2/6 TR loud P2 Lungs: decreased throughout. No wheezing Abdomen: soft, nontender, nondistended. No hepatosplenomegaly. No bruits or masses. Good bowel sounds. Extremities: no cyanosis, clubbing, rash, edema Neuro: alert & orientedx3, cranial nerves grossly intact. Moves all 4 extremities w/o difficulty. Affect pleasant.    ASSESSMENT & PLAN:  1. PAH with right heart failure --suspect combination of WHO Group I and III PAH --symptomatically much improved with diuresis, O2 and sildenafil. Echo 4/17showed persistent severe PAH  with RV strain.  --continue lasix --will start Adcirca Monday. After one month will bring back and add Macitentan --Unable to afford pulmonary rehab.  --will need 6MW at next visit 2. Pericardial effusion --essentially resolved on echwith treatment of HF and PAH 3. Chronic Respiratory failure - On 2 liters oxygen - Follows with Dr. Melvyn Novas. repeat PFTs pending 4. COPD- Gold III  - on inhalers. Follows with Pulmoanry. As above 5. Polycythemia suspect due to chronic hypoxia  --now on home O2 6. Cirrhosis on CT --due to RHF. Hepatitis serologies are negative --no ascites on exam  Kyland No,MD 2:55 PM

## 2016-01-18 NOTE — Patient Instructions (Signed)
Your physician recommends that you schedule a follow-up appointment in: 4 weeks with Dr Haroldine Laws

## 2016-01-18 NOTE — Addendum Note (Signed)
Encounter addended by: Kerry Dory, CMA on: 01/18/2016  3:12 PM<BR>     Documentation filed: Patient Instructions Section

## 2016-01-29 ENCOUNTER — Telehealth: Payer: Self-pay

## 2016-01-29 NOTE — Telephone Encounter (Signed)
Patient is on my [employer] list for 2017. Pt may be a good candidate for an AWV for 2017

## 2016-01-30 NOTE — Telephone Encounter (Signed)
Spoke with pt's spouse, [name]. Pt has several health issues at this time. Will attempt to complete AWV later in 2017.

## 2016-02-06 DIAGNOSIS — J45901 Unspecified asthma with (acute) exacerbation: Secondary | ICD-10-CM | POA: Diagnosis not present

## 2016-02-06 DIAGNOSIS — R05 Cough: Secondary | ICD-10-CM | POA: Diagnosis not present

## 2016-02-06 DIAGNOSIS — I10 Essential (primary) hypertension: Secondary | ICD-10-CM | POA: Diagnosis not present

## 2016-02-06 DIAGNOSIS — R0602 Shortness of breath: Secondary | ICD-10-CM | POA: Diagnosis not present

## 2016-02-06 DIAGNOSIS — J449 Chronic obstructive pulmonary disease, unspecified: Secondary | ICD-10-CM | POA: Diagnosis not present

## 2016-02-06 DIAGNOSIS — J9601 Acute respiratory failure with hypoxia: Secondary | ICD-10-CM | POA: Diagnosis not present

## 2016-02-06 DIAGNOSIS — J45909 Unspecified asthma, uncomplicated: Secondary | ICD-10-CM | POA: Diagnosis not present

## 2016-02-06 DIAGNOSIS — R062 Wheezing: Secondary | ICD-10-CM | POA: Diagnosis not present

## 2016-02-06 DIAGNOSIS — J188 Other pneumonia, unspecified organism: Secondary | ICD-10-CM | POA: Diagnosis not present

## 2016-02-13 ENCOUNTER — Encounter (HOSPITAL_COMMUNITY): Payer: Self-pay | Admitting: Internal Medicine

## 2016-02-13 ENCOUNTER — Ambulatory Visit (HOSPITAL_COMMUNITY)
Admission: RE | Admit: 2016-02-13 | Discharge: 2016-02-13 | Disposition: A | Discharge status: Home / Self Care | Payer: Medicare HMO | Source: Ambulatory Visit | Attending: Internal Medicine | Admitting: Internal Medicine

## 2016-02-13 VITALS — BP 116/62 | HR 87 | Wt 153.0 lb

## 2016-02-13 DIAGNOSIS — I2721 Secondary pulmonary arterial hypertension: Secondary | ICD-10-CM

## 2016-02-13 DIAGNOSIS — Z87891 Personal history of nicotine dependence: Secondary | ICD-10-CM | POA: Diagnosis not present

## 2016-02-13 DIAGNOSIS — M25551 Pain in right hip: Secondary | ICD-10-CM | POA: Diagnosis not present

## 2016-02-13 DIAGNOSIS — Z9981 Dependence on supplemental oxygen: Secondary | ICD-10-CM | POA: Diagnosis not present

## 2016-02-13 DIAGNOSIS — Z825 Family history of asthma and other chronic lower respiratory diseases: Secondary | ICD-10-CM | POA: Insufficient documentation

## 2016-02-13 DIAGNOSIS — J9610 Chronic respiratory failure, unspecified whether with hypoxia or hypercapnia: Secondary | ICD-10-CM | POA: Insufficient documentation

## 2016-02-13 DIAGNOSIS — J449 Chronic obstructive pulmonary disease, unspecified: Secondary | ICD-10-CM | POA: Insufficient documentation

## 2016-02-13 DIAGNOSIS — Z79899 Other long term (current) drug therapy: Secondary | ICD-10-CM | POA: Diagnosis not present

## 2016-02-13 DIAGNOSIS — I272 Other secondary pulmonary hypertension: Secondary | ICD-10-CM | POA: Insufficient documentation

## 2016-02-13 DIAGNOSIS — I509 Heart failure, unspecified: Secondary | ICD-10-CM | POA: Diagnosis not present

## 2016-02-13 DIAGNOSIS — I110 Hypertensive heart disease with heart failure: Secondary | ICD-10-CM | POA: Insufficient documentation

## 2016-02-13 DIAGNOSIS — D751 Secondary polycythemia: Secondary | ICD-10-CM | POA: Diagnosis not present

## 2016-02-13 DIAGNOSIS — Z7982 Long term (current) use of aspirin: Secondary | ICD-10-CM | POA: Diagnosis not present

## 2016-02-13 DIAGNOSIS — K7460 Unspecified cirrhosis of liver: Secondary | ICD-10-CM | POA: Insufficient documentation

## 2016-02-13 MED ORDER — MACITENTAN 10 MG PO TABS: 1.0000 | ORAL_TABLET | Freq: Every day | ORAL | Status: DC

## 2016-02-13 NOTE — Patient Instructions (Signed)
Start Opsumit 10 mg daily, they company will contact you to  Your physician recommends that you schedule a follow-up appointment in: 2 months

## 2016-02-13 NOTE — Progress Notes (Signed)
Patient ID: Linda Stevens, female   DOB: Dec 04, 1945, 70 y.o.   MRN: [id number] Patient ID: Linda Stevens, female   DOB: [birth year], 70 y.o.   MRN: [id number]  ADVANCED HF CLINIC NOTE  Patient ID: Linda Stevens, female   DOB: April 03, 1946, 70 y.o.   MRN: [id number] PCP: Primary Cardiologist: Dr Haroldine Laws  Pulmonary: Dr Melvyn Novas   HPI: 70 y/o woman with a h/o HTN, COPD (former smoker quit 2015), seasonal allergies, asthma, pulmonary HTN, and diastolic dysfxn.  Admitted 1/23 secondary to progressive DOE, hypoxia, and lower ext edema and has been found to have severe PAH. R/LHC 10/08/15 with normal coronaries, normal CO, and severe PAH PAP 104/49 with 13.6 WU. PAH well out of proportion to left-sided filling pressures and COPD. Started revatio 20 TID 10/08/15 with consideration for macitentan as outpatient. PFTs 10/05/15  FEV1 0.91 (40%)FVC 1.64 (55%), DLCO 8.80 (38%). Auto-immune and infectious serologies negative to date. Discharge weight 148 pounds.   Today she returns for HF follow up. Last visit sildenafil was added.  Since the last visit pulmonary evaluated with recs for max inhalers and 24 hour oxygen. Will repeat PFTs this week.   Returns for routine f/u: At last visit switched sildenafil to Adcirca 40. Able to get it but it put her in donut hole. Feels better. Able to walk faster. Now right hip bothering her. Thinks she pulled a muscle 2 weeks ago.  Now using O2 all the time.  No edema, orthopnea or PND.  Denies syncope/presyncope. On 2 liters continuous. Weight stable.  Following low salt diet and limiting fluid intake to < 2 liters. Taking all medications. Bought a TM and walking 1/2 mile twice a day.   Echo 4/17 EF 60-65% septum still flattened. RV size improved moderate HK. RVSP ~80. IVC small. Pericardial effusion essentially resolved    VQ scan low prob. CT chest mild COPD. Moderate pericardial effusion. 3v CAD. + cirrhosis. Hepatitis serologies negative.   PFTs  (FEV1 1.23  (52%) with FVC 2.01 (65%) and DLCO 51% in 7/15) PFT's 10/08/2015 FEV1 0.91 (40 %) ratio 56% no % improvement from saba with DLCO 38 % corrects to 63 % for alv volume   R/LHC 10/08/15 with normal coronaries, normal CO, and sever PAH with 13.6 WU.  Findings: Ao = 108/68 (86) LV = 102/10/11 RA = 9 RV = 97/8/12 PA = 104/49 (72) PCW = 18 Fick cardiac output/index = 3.96/2.34 PVR = 13.6 WU FA sat = 93% PA sat = 66%, 69%  PFTs 10/05/15 FVC 1.64 (55%), FEV1 0.91 (40%) DLCO 8.80 (38%). PFTs 04/06/14 FVC 2.01 (65%), FEV1 1.23 (52%) DLCO 12.25 (51%).  ROS: All systems negative except as listed in HPI, PMH and Problem List.  SH:  Social History   Social History  . Marital Status: Married    Spouse Name: N/A  . Number of Children: N/A  . Years of Education: N/A   Occupational History  . Not on file.   Social History Main Topics  . Smoking status: Former Smoker -- 1.00 packs/day for 40 years    Types: Cigarettes    Quit date: 02/22/2014  . Smokeless tobacco: Never Used  . Alcohol Use: No  . Drug Use: No  . Sexual Activity: No   Other Topics Concern  . Not on file   Social History Narrative   Married with 2 children.  Independent of ADLs.      Does not have a living will.   Would desire CPR  but would not want prolonged life support if futile- husband aware.    FH:  Family History  Problem Relation Age of Onset  . Glaucoma Brother   . Vascular Disease Father     Died of complications from surgery  . Asthma Mother     Died of asthma attack  . Dementia Mother     Past Medical History  Diagnosis Date  . Allergic rhinitis due to pollen   . Hypertensive heart disease   . H/O seasonal allergies   . Diastolic dysfunction     a. 02/2014 Echo: EF 65-70%, Gr 1 DD, RVH;  b. 09/2015 Echo: EF 55%, Gr 1 DD.  [name] Acute respiratory failure with hypoxia and hypercapnea 02/26/2014  . Right heart failure with reduced right ventricular function ([place])     a. 09/2015 Echo: EF 55%, Gr 1  DD, D shaped IV septum, sev dil RV with mod reduced fxn, mod dil RA, RV-RA grad 41mHg, PASP 876mg, mod pericard eff w/o tamponade.  . Pericardial effusion     a. 09/2015 Echo: Mod eff w/o tamponade.  . Severe Pulmonary Hypertension     a. 09/2015 Echo: PASP 8775m.  . CMarland KitchenPD (chronic obstructive pulmonary disease) [place] . Lower extremity edema   . History of tobacco abuse     a. Quit 2015.    Current Outpatient Prescriptions  Medication Sig Dispense Refill  . aspirin EC 81 MG EC tablet Take 1 tablet (81 mg total) by mouth daily. 30 tablet 0  . fluticasone (FLONASE) 50 MCG/ACT nasal spray SHAKE WELL AND USE 2 SPRAYS IN EACH NOSTRIL DAILY 16 g 4  . fluticasone-salmeterol (ADVAIR HFA) 115-21 MCG/ACT inhaler Inhale 2 puffs into the lungs 2 (two) times daily. 1 Inhaler 12  . furosemide (LASIX) 20 MG tablet Take 1 tablet (20 mg total) by mouth daily. 30 tablet 6  . OXYGEN 1.5 to 2lpm 24/7    . potassium chloride (K-DUR) 10 MEQ tablet Take 1 tablet (10 mEq total) by mouth daily. 30 tablet 6  . Tadalafil, PAH, (ADCIRCA) 20 MG TABS Take 40 mg by mouth daily.    . tMarland Kitchenotropium (SPIRIVA) 18 MCG inhalation capsule Place 1 capsule (18 mcg total) into inhaler and inhale daily. 30 capsule 12   No current facility-administered medications for this encounter.    Filed Vitals:   02/13/16 1137  BP: 116/62  Pulse: 87  Weight: 153 lb (69.4 kg)  SpO2: 97%    PHYSICAL EXAM:  General:  Well appearing. No resp difficulty HEENT: normal Neck: supple. JVP flat. Carotids 2+ bilaterally; no bruits. No lymphadenopathy or thryomegaly appreciated. Cor: PMI normal. Regular rate & rhythm. 2/6 TR loud P2 Lungs: decreased throughout. No wheezing Abdomen: soft, nontender, nondistended. No hepatosplenomegaly. No bruits or masses. Good bowel sounds. Extremities: no cyanosis, clubbing, rash, edema Neuro: alert & orientedx3, cranial nerves grossly intact. Moves all 4 extremities w/o difficulty. Affect  pleasant.   ASSESSMENT & PLAN:  1. PAH with right heart failure --suspect combination of WHO Group I and III PAH --symptomatically much improved with diuresis, O2 and Adcirca. Echo 4/17showed persistent severe PAH with RV strain.  --continue lasix --will add Macitentan 10 mg daily --Unable to afford pulmonary rehab.  --wanted to do 6MW today but can't with hip pain. 2. Pericardial effusion --essentially resolved on echwith treatment of HF and PAH 3. Chronic Respiratory failure - On 2 liters oxygen - Follows with Dr. WerMelvyn Novasepeat PFTs pending 4. COPD- Gold III  -  on inhalers. Follows with Pulmoanry. As above 5. Polycythemia suspect due to chronic hypoxia  --now on home O2 6. Cirrhosis on CT --due to RHF. Hepatitis serologies are negative --no ascites on exam 7. Right hip pain --sounds musculoskeletal. If persists send to sportsmedicine  Javed Cotto,MD 11:58 AM

## 2016-02-13 NOTE — Addendum Note (Signed)
Encounter addended by: Scarlette Calico, RN on: 02/13/2016 12:37 PM<BR>     Documentation filed: Patient Instructions Section, Orders

## 2016-02-27 ENCOUNTER — Telehealth (HOSPITAL_COMMUNITY): Payer: Self-pay | Admitting: *Deleted

## 2016-02-27 NOTE — Telephone Encounter (Signed)
Completed PA for pt's Opsumit, med approved through 09/07/16, [facility] aware

## 2016-03-04 ENCOUNTER — Telehealth: Payer: Self-pay | Admitting: Pulmonary Disease

## 2016-03-04 NOTE — Telephone Encounter (Signed)
Called spoke with pt. She states that she received a statement from her insurance company stating that she received a pneumonia at her ov with BQ on 01/17/16. She states that she was asked if she had her pneumonia vaccine at the office visit but was never given the vaccine. She feels that her insurance should not be responsible for accepting these charges when she did not receive the vaccine.  I explained to her that it was documented in her chart that the Prevnar 13 was given by Mathis Dad, CMA on the appointment date 01/17/16.

## 2016-03-07 DIAGNOSIS — J45909 Unspecified asthma, uncomplicated: Secondary | ICD-10-CM | POA: Diagnosis not present

## 2016-03-07 DIAGNOSIS — J449 Chronic obstructive pulmonary disease, unspecified: Secondary | ICD-10-CM | POA: Diagnosis not present

## 2016-03-07 DIAGNOSIS — R0602 Shortness of breath: Secondary | ICD-10-CM | POA: Diagnosis not present

## 2016-03-07 DIAGNOSIS — R05 Cough: Secondary | ICD-10-CM | POA: Diagnosis not present

## 2016-03-07 DIAGNOSIS — I10 Essential (primary) hypertension: Secondary | ICD-10-CM | POA: Diagnosis not present

## 2016-03-07 DIAGNOSIS — J45901 Unspecified asthma with (acute) exacerbation: Secondary | ICD-10-CM | POA: Diagnosis not present

## 2016-03-07 DIAGNOSIS — J188 Other pneumonia, unspecified organism: Secondary | ICD-10-CM | POA: Diagnosis not present

## 2016-03-07 DIAGNOSIS — J9601 Acute respiratory failure with hypoxia: Secondary | ICD-10-CM | POA: Diagnosis not present

## 2016-03-07 DIAGNOSIS — R062 Wheezing: Secondary | ICD-10-CM | POA: Diagnosis not present

## 2016-04-07 DIAGNOSIS — R05 Cough: Secondary | ICD-10-CM | POA: Diagnosis not present

## 2016-04-07 DIAGNOSIS — J45909 Unspecified asthma, uncomplicated: Secondary | ICD-10-CM | POA: Diagnosis not present

## 2016-04-07 DIAGNOSIS — J9601 Acute respiratory failure with hypoxia: Secondary | ICD-10-CM | POA: Diagnosis not present

## 2016-04-07 DIAGNOSIS — R0602 Shortness of breath: Secondary | ICD-10-CM | POA: Diagnosis not present

## 2016-04-07 DIAGNOSIS — J45901 Unspecified asthma with (acute) exacerbation: Secondary | ICD-10-CM | POA: Diagnosis not present

## 2016-04-07 DIAGNOSIS — R062 Wheezing: Secondary | ICD-10-CM | POA: Diagnosis not present

## 2016-04-07 DIAGNOSIS — J449 Chronic obstructive pulmonary disease, unspecified: Secondary | ICD-10-CM | POA: Diagnosis not present

## 2016-04-07 DIAGNOSIS — I10 Essential (primary) hypertension: Secondary | ICD-10-CM | POA: Diagnosis not present

## 2016-04-07 DIAGNOSIS — J188 Other pneumonia, unspecified organism: Secondary | ICD-10-CM | POA: Diagnosis not present

## 2016-04-14 ENCOUNTER — Encounter (HOSPITAL_COMMUNITY): Payer: Self-pay | Admitting: Internal Medicine

## 2016-04-14 ENCOUNTER — Ambulatory Visit (HOSPITAL_COMMUNITY)
Admission: RE | Admit: 2016-04-14 | Discharge: 2016-04-14 | Disposition: A | Discharge status: Home / Self Care | Payer: Medicare HMO | Source: Ambulatory Visit | Attending: Internal Medicine | Admitting: Internal Medicine

## 2016-04-14 VITALS — BP 110/66 | HR 102 | Wt 152.2 lb

## 2016-04-14 DIAGNOSIS — K7460 Unspecified cirrhosis of liver: Secondary | ICD-10-CM | POA: Insufficient documentation

## 2016-04-14 DIAGNOSIS — I272 Other secondary pulmonary hypertension: Secondary | ICD-10-CM | POA: Diagnosis not present

## 2016-04-14 DIAGNOSIS — Z9981 Dependence on supplemental oxygen: Secondary | ICD-10-CM | POA: Diagnosis not present

## 2016-04-14 DIAGNOSIS — J449 Chronic obstructive pulmonary disease, unspecified: Secondary | ICD-10-CM | POA: Insufficient documentation

## 2016-04-14 DIAGNOSIS — Z87891 Personal history of nicotine dependence: Secondary | ICD-10-CM | POA: Diagnosis not present

## 2016-04-14 DIAGNOSIS — Z79899 Other long term (current) drug therapy: Secondary | ICD-10-CM | POA: Insufficient documentation

## 2016-04-14 DIAGNOSIS — I110 Hypertensive heart disease with heart failure: Secondary | ICD-10-CM | POA: Diagnosis not present

## 2016-04-14 DIAGNOSIS — I509 Heart failure, unspecified: Secondary | ICD-10-CM

## 2016-04-14 DIAGNOSIS — D751 Secondary polycythemia: Secondary | ICD-10-CM | POA: Diagnosis not present

## 2016-04-14 DIAGNOSIS — J9611 Chronic respiratory failure with hypoxia: Secondary | ICD-10-CM | POA: Diagnosis not present

## 2016-04-14 DIAGNOSIS — Z825 Family history of asthma and other chronic lower respiratory diseases: Secondary | ICD-10-CM | POA: Insufficient documentation

## 2016-04-14 DIAGNOSIS — Z7982 Long term (current) use of aspirin: Secondary | ICD-10-CM | POA: Insufficient documentation

## 2016-04-14 DIAGNOSIS — J9610 Chronic respiratory failure, unspecified whether with hypoxia or hypercapnia: Secondary | ICD-10-CM | POA: Diagnosis not present

## 2016-04-14 DIAGNOSIS — I50810 Right heart failure, unspecified: Secondary | ICD-10-CM

## 2016-04-14 MED ORDER — MACITENTAN 10 MG PO TABS: 5.0000 mg | ORAL_TABLET | Freq: Every day | ORAL | Status: DC

## 2016-04-14 NOTE — Progress Notes (Signed)
Medication Samples have been provided to the patient.  Drug name: Letairis       Strength: 5 mg        Qty: 2 bottles  LOT: [id number]  Exp.Date: Feb 2018  Dosing instructions: 2 tabs daily STARING WHEN YOU RUN OUT OF OPSUMIT  The patient has been instructed regarding the correct time, dose, and frequency of taking this medication, including desired effects and most common side effects.   Kennedy Brines 12:28 PM 04/14/2016

## 2016-04-14 NOTE — Addendum Note (Signed)
Encounter addended by: Scarlette Calico, RN on: 04/14/2016 12:20 PM<BR>    Actions taken: Order Entry activity accessed, Sign clinical note

## 2016-04-14 NOTE — Addendum Note (Signed)
Encounter addended by: Scarlette Calico, RN on: 04/14/2016 12:29 PM<BR>    Actions taken: Sign clinical note

## 2016-04-14 NOTE — Patient Instructions (Addendum)
Decrease Macitentan (Opsumit) to 5 mg (1/2 tab) daily  We have submitted new prescription for Letairis 10 mg, ONCE YOU RECEIVE THIS MEDICATION STOP THE MACITENTAN BEFORE STARTING IT  We will contact you in 2 months to schedule your next appointment.

## 2016-04-14 NOTE — Progress Notes (Signed)
6 min walk test completed:  Pt ambulated 840 ft (256 M) on 2 L O2, sats ranged 83-95%, HR ranged 96-111.

## 2016-04-14 NOTE — Progress Notes (Signed)
Patient ID: ADALYN PENNOCK, female   DOB: 1946-05-27, 70 y.o.   MRN: [id number]  ADVANCED HF CLINIC NOTE  Patient ID: EMMA BIRCHLER, female   DOB: [birth year], 70 y.o.   MRN: [id number] PCP: Primary Cardiologist: Dr Haroldine Laws  Pulmonary: Dr Melvyn Novas   HPI: 70 y/o woman with a h/o HTN, COPD (former smoker quit 2015), seasonal allergies, asthma, pulmonary HTN, and diastolic dysfxn.  Admitted 1/23 secondary to progressive DOE, hypoxia, and lower ext edema and has been found to have severe PAH. R/LHC 10/08/15 with normal coronaries, normal CO, and severe PAH PAP 104/49 with 13.6 WU. PAH well out of proportion to left-sided filling pressures and COPD. Started revatio 20 TID 10/08/15 with consideration for macitentan as outpatient. PFTs 10/05/15  FEV1 0.91 (40%)FVC 1.64 (55%), DLCO 8.80 (38%). Auto-immune and infectious serologies negative to date. Discharge weight 148 pounds.   Today she returns for HF follow up. Last visit sildenafil was added.  Since the last visit pulmonary evaluated with recs for max inhalers and 24 hour oxygen. Will repeat PFTs this week.   Returns for routine f/u: at last visit started on Macitentan. Now on Adcirca 40 and Macitentan 10. Say she doesn't feel so well. Has had hard time tolerating Macitentan. Says sinuses feels full. Has clear drainage. Has been taking Mucinex and fluticasone without relief. Walking 20 mins per day on TM. 40mh. No incline. No swelling. No syncope or presyncope.    Echo 4/17 EF 60-65% septum still flattened. RV size improved moderate HK. RVSP ~80. IVC small. Pericardial effusion essentially resolved   VQ scan low prob. CT chest mild COPD. Moderate pericardial effusion. 3v CAD. + cirrhosis. Hepatitis serologies negative.   PFTs  (FEV1 1.23 (52%) with FVC 2.01 (65%) and DLCO 51% in 7/15) PFT's 10/08/2015 FEV1 0.91 (40 %) ratio 56% no % improvement from saba with DLCO 38 % corrects to 63 % for alv volume   R/LHC 10/08/15 with normal  coronaries, normal CO, and sever PAH with 13.6 WU.  Findings: Ao = 108/68 (86) LV = 102/10/11 RA = 9 RV = 97/8/12 PA = 104/49 (72) PCW = 18 Fick cardiac output/index = 3.96/2.34 PVR = 13.6 WU FA sat = 93% PA sat = 66%, 69%  PFTs 10/05/15 FVC 1.64 (55%), FEV1 0.91 (40%) DLCO 8.80 (38%). PFTs 04/06/14 FVC 2.01 (65%), FEV1 1.23 (52%) DLCO 12.25 (51%).  ROS: All systems negative except as listed in HPI, PMH and Problem List.  SH:  Social History   Social History  . Marital status: Married    Spouse name: N/A  . Number of children: N/A  . Years of education: N/A   Occupational History  . Not on file.   Social History Main Topics  . Smoking status: Former Smoker    Packs/day: 1.00    Years: 40.00    Types: Cigarettes    Quit date: 02/22/2014  . Smokeless tobacco: Never Used  . Alcohol use No  . Drug use: No  . Sexual activity: No   Other Topics Concern  . Not on file   Social History Narrative   Married with 2 children.  Independent of ADLs.      Does not have a living will.   Would desire CPR but would not want prolonged life support if futile- husband aware.    FH:  Family History  Problem Relation Age of Onset  . Glaucoma Brother   . Vascular Disease Father     Died of complications from  surgery  . Asthma Mother     Died of asthma attack  . Dementia Mother     Past Medical History:  Diagnosis Date  . Acute respiratory failure with hypoxia and hypercapnea 02/26/2014  . Allergic rhinitis due to pollen   . COPD (chronic obstructive pulmonary disease) ([place])   . Diastolic dysfunction    a. 02/2014 Echo: EF 65-70%, Gr 1 DD, RVH;  b. 09/2015 Echo: EF 55%, Gr 1 DD.  . H/O seasonal allergies   . History of tobacco abuse    a. Quit 2015.  [name] Hypertensive heart disease   . Lower extremity edema   . Pericardial effusion    a. 09/2015 Echo: Mod eff w/o tamponade.  . Right heart failure with reduced right ventricular function ([place])    a. 09/2015 Echo: EF 55%, Gr 1  DD, D shaped IV septum, sev dil RV with mod reduced fxn, mod dil RA, RV-RA grad 77mHg, PASP 835mg, mod pericard eff w/o tamponade.  . Severe Pulmonary Hypertension    a. 09/2015 Echo: PASP 874m.    Current Outpatient Prescriptions  Medication Sig Dispense Refill  . aspirin EC 81 MG EC tablet Take 1 tablet (81 mg total) by mouth daily. 30 tablet 0  . fluticasone (FLONASE) 50 MCG/ACT nasal spray SHAKE WELL AND USE 2 SPRAYS IN EACH NOSTRIL DAILY 16 g 4  . fluticasone-salmeterol (ADVAIR HFA) 115-21 MCG/ACT inhaler Inhale 2 puffs into the lungs 2 (two) times daily. 1 Inhaler 12  . furosemide (LASIX) 20 MG tablet Take 1 tablet (20 mg total) by mouth daily. 30 tablet 6  . Macitentan (OPSUMIT) 10 MG TABS Take 1 tablet by mouth daily. 30 tablet   . OXYGEN 1.5 to 2lpm 24/7    . potassium chloride (K-DUR) 10 MEQ tablet Take 1 tablet (10 mEq total) by mouth daily. 30 tablet 6  . Tadalafil, PAH, (ADCIRCA) 20 MG TABS Take 40 mg by mouth daily.    . tMarland Kitchenotropium (SPIRIVA) 18 MCG inhalation capsule Place 1 capsule (18 mcg total) into inhaler and inhale daily. 30 capsule 12   No current facility-administered medications for this encounter.     Vitals:   04/14/16 1118  BP: 110/66  Pulse: (!) 102  SpO2: 96%  Weight: 152 lb 4 oz (69.1 kg)    PHYSICAL EXAM:  General:  Well appearing. No resp difficulty HEENT: normal Neck: supple. JVP flat. Carotids 2+ bilaterally; no bruits. No lymphadenopathy or thryomegaly appreciated. Cor: PMI normal. Regular rate & rhythm. 2/6 TR loud P2 Lungs: decreased throughout. No wheezing Abdomen: soft, nontender, nondistended. No hepatosplenomegaly. No bruits or masses. Good bowel sounds. Extremities: no cyanosis, clubbing, rash, trace edema Neuro: alert & orientedx3, cranial nerves grossly intact. Moves all 4 extremities w/o difficulty. Affect pleasant.   ASSESSMENT & PLAN:  1. PAH with right heart failure --suspect combination of WHO Group I and III  PAH --symptomatically much improved with diuresis, O2 and Adcirca. Echo 4/17showed persistent severe PAH with RV strain.  --continue lasix --Not tolerating Macitentan well due to sinusitis. Will switch to Letairis 53m25m--Unable to afford pulmonary rehab.  --Needs 6MW today 2. Pericardial effusion --essentially resolved on echwith treatment of HF and PAH 3. Chronic Respiratory failure - On 2 liters oxygen - Follows with Dr. WertMelvyn Novaspeat PFTs pending 4. COPD- Gold III  - on inhalers. Follows with Pulmoanry. As above 5. Polycythemia suspect due to chronic hypoxia  --now on home O2 6. Cirrhosis on CT --due to RHF. Hepatitis  serologies are negative --no ascites on exam   Rochell Mabie,MD 11:46 AM

## 2016-04-17 ENCOUNTER — Telehealth (HOSPITAL_COMMUNITY): Payer: Self-pay | Admitting: *Deleted

## 2016-04-17 MED ORDER — AMBRISENTAN 10 MG PO TABS: 10.0000 mg | ORAL_TABLET | Freq: Every day | ORAL | Status: DC

## 2016-04-17 NOTE — Telephone Encounter (Signed)
Completed PA for pt's Letairis via [name], med approved through 09/07/16, [facility] aware

## 2016-05-08 ENCOUNTER — Telehealth (HOSPITAL_COMMUNITY): Payer: Self-pay | Admitting: Pharmacist

## 2016-05-08 DIAGNOSIS — R062 Wheezing: Secondary | ICD-10-CM | POA: Diagnosis not present

## 2016-05-08 DIAGNOSIS — J449 Chronic obstructive pulmonary disease, unspecified: Secondary | ICD-10-CM | POA: Diagnosis not present

## 2016-05-08 DIAGNOSIS — I10 Essential (primary) hypertension: Secondary | ICD-10-CM | POA: Diagnosis not present

## 2016-05-08 DIAGNOSIS — R05 Cough: Secondary | ICD-10-CM | POA: Diagnosis not present

## 2016-05-08 DIAGNOSIS — J45909 Unspecified asthma, uncomplicated: Secondary | ICD-10-CM | POA: Diagnosis not present

## 2016-05-08 DIAGNOSIS — J9601 Acute respiratory failure with hypoxia: Secondary | ICD-10-CM | POA: Diagnosis not present

## 2016-05-08 DIAGNOSIS — J188 Other pneumonia, unspecified organism: Secondary | ICD-10-CM | POA: Diagnosis not present

## 2016-05-08 DIAGNOSIS — R0602 Shortness of breath: Secondary | ICD-10-CM | POA: Diagnosis not present

## 2016-05-08 DIAGNOSIS — J45901 Unspecified asthma with (acute) exacerbation: Secondary | ICD-10-CM | POA: Diagnosis not present

## 2016-05-08 MED ORDER — AMBRISENTAN 10 MG PO TABS: 10.0000 mg | ORAL_TABLET | Freq: Every day | ORAL | 11 refills | Status: DC

## 2016-05-08 NOTE — Telephone Encounter (Signed)
Ms. Lasky called stating that the [employer] representative told her that they no longer had funding for Letairis. I have called her and asked her to try [employer] [phone]). I have also sent her Rx to [facility] to see if they may have other options for her.   Ruta Hinds. [name], PharmD, BCPS, CPP Clinical Pharmacist Pager: [phone] Phone: [phone] 05/08/2016 11:32 AM

## 2016-05-13 ENCOUNTER — Telehealth (HOSPITAL_COMMUNITY): Payer: Self-pay | Admitting: Pharmacist

## 2016-05-13 NOTE — Telephone Encounter (Signed)
[employer] patient assistance for Linda Stevens was denied based on patient's income. They stated that she needs to apply for Medicare LIS. If she is denied she can send a copy of the denial to [place] and they would be able to reconsider her application. I have given her the phone number [phone]) and website ([email or web address]) for Social Security Extra Help application. We can provide her with more samples in the meantime if she runs out of her current supply.   Ruta Hinds. [name], PharmD, BCPS, CPP Clinical Pharmacist Pager: [phone] Phone: [phone] 05/13/2016 12:55 PM

## 2016-05-27 ENCOUNTER — Telehealth (HOSPITAL_COMMUNITY): Payer: Self-pay | Admitting: Pharmacist

## 2016-05-27 NOTE — Telephone Encounter (Signed)
Medicare LIS was denied but she will not get the denial letter for at least 2 weeks. We will need a copy of this letter to move forward with [employer] patient assistance for her Letairis. Will provide her with samples until then.   Medication Samples have been provided to the patient.  Drug name: Letairis       Strength: 5 mg        Qty: 30  LOT: [id number]  Exp.Date: 2/18  Dosing instructions: Take 1 tablet daily   The patient has been instructed regarding the correct time, dose, and frequency of taking this medication, including desired effects and most common side effects.   Ruta Hinds Zoey Gilkeson 11:14 AM 05/27/2016

## 2016-06-04 ENCOUNTER — Other Ambulatory Visit (HOSPITAL_COMMUNITY): Payer: Self-pay | Admitting: *Deleted

## 2016-06-04 MED ORDER — FUROSEMIDE 20 MG PO TABS: 20.0000 mg | ORAL_TABLET | Freq: Every day | ORAL | 6 refills | Status: DC

## 2016-06-04 MED ORDER — POTASSIUM CHLORIDE ER 10 MEQ PO TBCR: 10.0000 meq | EXTENDED_RELEASE_TABLET | Freq: Every day | ORAL | 6 refills | Status: DC

## 2016-06-07 DIAGNOSIS — R05 Cough: Secondary | ICD-10-CM | POA: Diagnosis not present

## 2016-06-07 DIAGNOSIS — J188 Other pneumonia, unspecified organism: Secondary | ICD-10-CM | POA: Diagnosis not present

## 2016-06-07 DIAGNOSIS — J45901 Unspecified asthma with (acute) exacerbation: Secondary | ICD-10-CM | POA: Diagnosis not present

## 2016-06-07 DIAGNOSIS — R062 Wheezing: Secondary | ICD-10-CM | POA: Diagnosis not present

## 2016-06-07 DIAGNOSIS — J449 Chronic obstructive pulmonary disease, unspecified: Secondary | ICD-10-CM | POA: Diagnosis not present

## 2016-06-07 DIAGNOSIS — I10 Essential (primary) hypertension: Secondary | ICD-10-CM | POA: Diagnosis not present

## 2016-06-07 DIAGNOSIS — J9601 Acute respiratory failure with hypoxia: Secondary | ICD-10-CM | POA: Diagnosis not present

## 2016-06-07 DIAGNOSIS — R0602 Shortness of breath: Secondary | ICD-10-CM | POA: Diagnosis not present

## 2016-06-07 DIAGNOSIS — J45909 Unspecified asthma, uncomplicated: Secondary | ICD-10-CM | POA: Diagnosis not present

## 2016-06-11 ENCOUNTER — Other Ambulatory Visit (HOSPITAL_COMMUNITY): Payer: Self-pay | Admitting: *Deleted

## 2016-06-11 MED ORDER — POTASSIUM CHLORIDE ER 10 MEQ PO TBCR: 10.0000 meq | EXTENDED_RELEASE_TABLET | Freq: Every day | ORAL | 6 refills | Status: DC

## 2016-06-11 MED ORDER — FUROSEMIDE 20 MG PO TABS: 20.0000 mg | ORAL_TABLET | Freq: Every day | ORAL | 6 refills | Status: DC

## 2016-06-12 ENCOUNTER — Other Ambulatory Visit (HOSPITAL_COMMUNITY): Payer: Self-pay | Admitting: Pharmacist

## 2016-06-12 MED ORDER — FUROSEMIDE 20 MG PO TABS: 20.0000 mg | ORAL_TABLET | Freq: Every day | ORAL | 6 refills | Status: DC

## 2016-06-12 MED ORDER — POTASSIUM CHLORIDE ER 10 MEQ PO TBCR: 10.0000 meq | EXTENDED_RELEASE_TABLET | Freq: Every day | ORAL | 6 refills | Status: DC

## 2016-06-17 ENCOUNTER — Telehealth (HOSPITAL_COMMUNITY): Payer: Self-pay | Admitting: Pharmacist

## 2016-06-17 NOTE — Telephone Encounter (Signed)
[employer] patient assistance rep Tamela Oddi called asking about status of her application. I have told her that we are still waiting for the Medicare LIS denial letter and will send it in as soon as possible. She has extended the application until we can get that sent in.   Tokiko Diefenderfer K. [name], PharmD, BCPS, CPP Clinical Pharmacist Pager: [phone] Phone: [phone] 06/17/2016 10:26 AM

## 2016-06-23 ENCOUNTER — Telehealth (HOSPITAL_COMMUNITY): Payer: Self-pay | Admitting: Pharmacist

## 2016-06-23 NOTE — Telephone Encounter (Signed)
Linda Stevens called stating she just received her LIS denial letter and will bring in a copy today. She will be running out of her Letairis in the next couple of days so will provide her with a few more samples in the meantime.   Medication Samples have been provided to the patient.  Drug name: Letairis       Strength: 5 mg        Qty: 20  LOT: [id number]  Exp.Date: 11/2019  Dosing instructions: Take 1 tablet daily   The patient has been instructed regarding the correct time, dose, and frequency of taking this medication, including desired effects and most common side effects.   Ruta Hinds [name] 10:13 AM 06/23/2016   Ruta Hinds. [name], PharmD, BCPS, CPP Clinical Pharmacist Pager: [phone] Phone: [phone] 06/23/2016 10:13 AM

## 2016-06-24 ENCOUNTER — Other Ambulatory Visit: Payer: Self-pay | Admitting: Family Medicine

## 2016-07-08 ENCOUNTER — Telehealth (HOSPITAL_COMMUNITY): Payer: Self-pay | Admitting: *Deleted

## 2016-07-08 ENCOUNTER — Encounter: Payer: Self-pay | Admitting: Family Medicine

## 2016-07-08 ENCOUNTER — Ambulatory Visit (INDEPENDENT_AMBULATORY_CARE_PROVIDER_SITE_OTHER): Payer: Medicare HMO | Admitting: Family Medicine

## 2016-07-08 VITALS — BP 124/62 | HR 113 | Temp 98.2°F | Wt 150.2 lb

## 2016-07-08 DIAGNOSIS — J45909 Unspecified asthma, uncomplicated: Secondary | ICD-10-CM | POA: Diagnosis not present

## 2016-07-08 DIAGNOSIS — Z23 Encounter for immunization: Secondary | ICD-10-CM

## 2016-07-08 DIAGNOSIS — R05 Cough: Secondary | ICD-10-CM | POA: Diagnosis not present

## 2016-07-08 DIAGNOSIS — J9601 Acute respiratory failure with hypoxia: Secondary | ICD-10-CM | POA: Diagnosis not present

## 2016-07-08 DIAGNOSIS — J188 Other pneumonia, unspecified organism: Secondary | ICD-10-CM | POA: Diagnosis not present

## 2016-07-08 DIAGNOSIS — R062 Wheezing: Secondary | ICD-10-CM | POA: Diagnosis not present

## 2016-07-08 DIAGNOSIS — J45901 Unspecified asthma with (acute) exacerbation: Secondary | ICD-10-CM | POA: Diagnosis not present

## 2016-07-08 DIAGNOSIS — J449 Chronic obstructive pulmonary disease, unspecified: Secondary | ICD-10-CM | POA: Diagnosis not present

## 2016-07-08 DIAGNOSIS — B370 Candidal stomatitis: Secondary | ICD-10-CM | POA: Diagnosis not present

## 2016-07-08 DIAGNOSIS — J302 Other seasonal allergic rhinitis: Secondary | ICD-10-CM

## 2016-07-08 DIAGNOSIS — I10 Essential (primary) hypertension: Secondary | ICD-10-CM | POA: Diagnosis not present

## 2016-07-08 DIAGNOSIS — R0602 Shortness of breath: Secondary | ICD-10-CM | POA: Diagnosis not present

## 2016-07-08 MED ORDER — NYSTATIN 100000 UNIT/ML MT SUSP: 5.0000 mL | Freq: Four times a day (QID) | OROMUCOSAL | 0 refills | Status: DC

## 2016-07-08 MED ORDER — LEVOCETIRIZINE DIHYDROCHLORIDE 5 MG PO TABS: 5.0000 mg | ORAL_TABLET | Freq: Every evening | ORAL | 3 refills | Status: DC

## 2016-07-08 NOTE — Telephone Encounter (Signed)
Patient called stating she thinks she needs to be seen because she was having some issues.  I asked if these issues were heart failure related she said no shes congested from allergies.  I told her Dr.Bensimhon is out of the office until Thursday and is completely booked majority of November and if she had a pcp they could manage her allergies and probably see her sooner. Patient said she didn't know she could still see her pcp with all of her heart medications she was on. I assured her it was ok to see her pcp for general problems cold,flu, allergies, etc. And tht we are the [facility] we only manage her heart failure.  Patient agreed to schedule an appt with pcp.

## 2016-07-08 NOTE — Progress Notes (Signed)
Subjective:   Patient ID: Linda Stevens, female    DOB: 14-Nov-1945, 70 y.o.   MRN: [id number]  Linda Stevens is a pleasant 70 y.o. year old female with h/o COPD, who presents to clinic today Nasal Congestion  on 07/08/2016  HPI: Here for worsening cough, congestion. Has tried OTC antihistamines in past without much improvement.  Symptoms have been ongoing since August. She is very anxious about this given her h/o hospitalization for pneumonia in past.    Started with runny nose, congestion for 2 months.   This has progressed to SOB, wheezing and cough over past four  days. Cough is dry but she feels she does have some mucous in her throat. No fevers.  Using her rescue inhaler three times daily over past few days, which does improve her symptoms somewhat.  No night time awakenings.  Currently taking spirva and advair (managed by Dr. Melvyn Novas).  No fevers.  Tongue is sore.  Current Outpatient Prescriptions on File Prior to Visit  Medication Sig Dispense Refill  . ambrisentan (LETAIRIS) 10 MG tablet Take 1 tablet (10 mg total) by mouth daily. 30 tablet 11  . aspirin EC 81 MG EC tablet Take 1 tablet (81 mg total) by mouth daily. 30 tablet 0  . fluticasone (FLONASE) 50 MCG/ACT nasal spray SHAKE LIQUID WELL AND USE 2 SPRAYS IN EACH NOSTRIL DAILY 16 g 1  . fluticasone-salmeterol (ADVAIR HFA) 115-21 MCG/ACT inhaler Inhale 2 puffs into the lungs 2 (two) times daily. 1 Inhaler 12  . furosemide (LASIX) 20 MG tablet Take 1 tablet (20 mg total) by mouth daily. 30 tablet 6  . OXYGEN 1.5 to 2lpm 24/7    . potassium chloride (K-DUR) 10 MEQ tablet Take 1 tablet (10 mEq total) by mouth daily. 30 tablet 6  . Tadalafil, PAH, (ADCIRCA) 20 MG TABS Take 40 mg by mouth daily.    [name] tiotropium (SPIRIVA) 18 MCG inhalation capsule Place 1 capsule (18 mcg total) into inhaler and inhale daily. 30 capsule 12   No current facility-administered medications on file prior to visit.     Allergies  Allergen  Reactions  . Amoxicillin-Pot Clavulanate     REACTION: gi upset Has patient had a PCN reaction causing immediate rash, facial/tongue/throat swelling, SOB or lightheadedness with hypotension: No Has patient had a PCN reaction causing severe rash involving mucus membranes or skin necrosis: No Has patient had a PCN reaction that required hospitalization : No Has patient had a PCN reaction occurring within the last 10 years: No If all of the above answers are "NO", then may proceed with Cephalosporin use.   . Cefdinir     REACTION: gi  upset  . Moxifloxacin     REACTION: rash  . Singulair [Montelukast Sodium]     Itching     Past Medical History:  Diagnosis Date  . Acute respiratory failure with hypoxia and hypercapnea 02/26/2014  . Allergic rhinitis due to pollen   . COPD (chronic obstructive pulmonary disease) ([place])   . Diastolic dysfunction    a. 02/2014 Echo: EF 65-70%, Gr 1 DD, RVH;  b. 09/2015 Echo: EF 55%, Gr 1 DD.  . H/O seasonal allergies   . History of tobacco abuse    a. Quit 2015.  [name] Hypertensive heart disease   . Lower extremity edema   . Pericardial effusion    a. 09/2015 Echo: Mod eff w/o tamponade.  . Right heart failure with reduced right ventricular function    a.  09/2015 Echo: EF 55%, Gr 1 DD, D shaped IV septum, sev dil RV with mod reduced fxn, mod dil RA, RV-RA grad 2mHg, PASP 820mg, mod pericard eff w/o tamponade.  . Severe Pulmonary Hypertension    a. 09/2015 Echo: PASP 873m.    Past Surgical History:  Procedure Laterality Date  . CARDIAC CATHETERIZATION N/A 10/08/2015   Procedure: Right/Left Heart Cath and Coronary Angiography;  Surgeon: DanJolaine ArtistD;  Location: [facility];  Service: Cardiovascular;  Laterality: N/A;  . CHOLECYSTECTOMY     Open    Family History  Problem Relation Age of Onset  . Glaucoma Brother   . Vascular Disease Father     Died of complications from surgery  . Asthma Mother     Died of asthma attack  .  Dementia Mother     Social History   Social History  . Marital status: Married    Spouse name: N/A  . Number of children: N/A  . Years of education: N/A   Occupational History  . Not on file.   Social History Main Topics  . Smoking status: Former Smoker    Packs/day: 1.00    Years: 40.00    Types: Cigarettes    Quit date: 02/22/2014  . Smokeless tobacco: Never Used  . Alcohol use No  . Drug use: No  . Sexual activity: No   Other Topics Concern  . Not on file   Social History Narrative   Married with 2 children.  Independent of ADLs.      Does not have a living will.   Would desire CPR but would not want prolonged life support if futile- husband aware.   The PMH, PSH, Social History, Family History, Medications, and allergies [facility] have been updated if relevant.   Review of Systems  Constitutional: Negative for chills, diaphoresis and fever.  HENT: Positive for congestion, ear pain and postnasal drip. Negative for sore throat and trouble swallowing.   Respiratory: Positive for shortness of breath. Negative for cough, choking, wheezing and stridor.   Cardiovascular: Negative for chest pain.  Gastrointestinal: Negative.   Musculoskeletal: Negative.   Skin: Negative.   Allergic/Immunologic: Negative.   Neurological: Negative.   Psychiatric/Behavioral: The patient is nervous/anxious.   All other systems reviewed and are negative.      Objective:    BP 124/62   Pulse (!) 113   Temp 98.2 F (36.8 C) (Oral)   Wt 150 lb 4 oz (68.2 kg)   SpO2 99%   BMI 26.62 kg/m    Physical Exam  Constitutional: She is oriented to person, place, and time. She appears well-developed and well-nourished. No distress.  HENT:  Head: Normocephalic.  Right Ear: Hearing normal. Tympanic membrane is retracted.  Left Ear: Hearing normal. Tympanic membrane is retracted.  Nose: Rhinorrhea present. Right sinus exhibits no maxillary sinus tenderness and no  frontal sinus tenderness. Left sinus exhibits no maxillary sinus tenderness and no frontal sinus tenderness.  Thick white film on tongue and buccal mucosa  Cardiovascular: Regular rhythm.   tachycardic  Pulmonary/Chest: Effort normal and breath sounds normal. No respiratory distress. She has no wheezes. She has no rales.  Musculoskeletal: Normal range of motion.  Neurological: She is alert and oriented to person, place, and time.  Skin: Skin is warm and dry.  Psychiatric:  Anxious  Nursing note and vitals reviewed.         Assessment & Plan:   Need  for influenza vaccination  Thrush No Follow-up on file.

## 2016-07-08 NOTE — Progress Notes (Signed)
Pre visit review using our clinic review tool, if applicable. No additional management support is needed unless otherwise documented below in the visit note.

## 2016-07-08 NOTE — Assessment & Plan Note (Signed)
Deteriorated. eRx sent for xyzal as she feels she has failed OTC antihistamines. Lung exam reassuring. Call or return to clinic prn if these symptoms worsen or fail to improve as anticipated. The patient indicates understanding of these issues and agrees with the plan.

## 2016-07-08 NOTE — Patient Instructions (Addendum)
Great to see you. Please use nystatin rinse as directed ad keep me updated.

## 2016-07-08 NOTE — Assessment & Plan Note (Signed)
New- eRx sent for nystatin rinse. Call or return to clinic prn if these symptoms worsen or fail to improve as anticipated. The patient indicates understanding of these issues and agrees with the plan.

## 2016-07-11 ENCOUNTER — Other Ambulatory Visit (HOSPITAL_COMMUNITY): Payer: Self-pay | Admitting: *Deleted

## 2016-07-15 ENCOUNTER — Other Ambulatory Visit (HOSPITAL_COMMUNITY): Payer: Self-pay | Admitting: Pharmacist

## 2016-07-15 ENCOUNTER — Telehealth: Payer: Self-pay

## 2016-07-15 MED ORDER — AMBRISENTAN 10 MG PO TABS: 10.0000 mg | ORAL_TABLET | Freq: Every day | ORAL | 11 refills | Status: DC

## 2016-07-15 MED ORDER — AZITHROMYCIN 250 MG PO TABS: ORAL_TABLET | ORAL | 0 refills | Status: DC

## 2016-07-15 NOTE — Telephone Encounter (Signed)
Noted.  erx sent for zpack to her pharmacy on file.

## 2016-07-15 NOTE — Telephone Encounter (Signed)
Spoke to pt who states she is taking nystatin but did not begin taking xyzal

## 2016-07-15 NOTE — Telephone Encounter (Signed)
Why is she not taking the nystatin??

## 2016-07-15 NOTE — Telephone Encounter (Signed)
Pt states she was here in last week and was told to use nystatin but hasn't been using it she's been taking claritin. She's not doing better her throat still feels itchy and has a cough, she's not sleeping at night. She's requesting a zpak. Please advise, thanks.

## 2016-07-18 ENCOUNTER — Telehealth (HOSPITAL_COMMUNITY): Payer: Self-pay | Admitting: Pharmacist

## 2016-07-18 NOTE — Telephone Encounter (Signed)
[employer] patient assistance Milda [employer]) needs new application from Ms. Rantz. She is aware and once it it sent to her she will send it to the company.   Ruta Hinds. [name], PharmD, BCPS, CPP Clinical Pharmacist Pager: [phone] Phone: [phone] 07/18/2016 12:21 PM

## 2016-07-30 ENCOUNTER — Telehealth (HOSPITAL_COMMUNITY): Payer: Self-pay | Admitting: Pharmacist

## 2016-07-30 NOTE — Telephone Encounter (Signed)
[employer] patient assistance approved for Letairis through 09/07/16.   Ruta Hinds. [name], PharmD, BCPS, CPP Clinical Pharmacist Pager: [phone] Phone: [phone] 07/30/2016 8:51 AM

## 2016-08-07 DIAGNOSIS — R05 Cough: Secondary | ICD-10-CM | POA: Diagnosis not present

## 2016-08-07 DIAGNOSIS — R0602 Shortness of breath: Secondary | ICD-10-CM | POA: Diagnosis not present

## 2016-08-07 DIAGNOSIS — J449 Chronic obstructive pulmonary disease, unspecified: Secondary | ICD-10-CM | POA: Diagnosis not present

## 2016-08-07 DIAGNOSIS — J45909 Unspecified asthma, uncomplicated: Secondary | ICD-10-CM | POA: Diagnosis not present

## 2016-08-07 DIAGNOSIS — J9601 Acute respiratory failure with hypoxia: Secondary | ICD-10-CM | POA: Diagnosis not present

## 2016-08-07 DIAGNOSIS — R062 Wheezing: Secondary | ICD-10-CM | POA: Diagnosis not present

## 2016-08-07 DIAGNOSIS — J45901 Unspecified asthma with (acute) exacerbation: Secondary | ICD-10-CM | POA: Diagnosis not present

## 2016-08-07 DIAGNOSIS — I10 Essential (primary) hypertension: Secondary | ICD-10-CM | POA: Diagnosis not present

## 2016-08-07 DIAGNOSIS — J188 Other pneumonia, unspecified organism: Secondary | ICD-10-CM | POA: Diagnosis not present

## 2016-08-14 ENCOUNTER — Other Ambulatory Visit (HOSPITAL_COMMUNITY): Payer: Self-pay | Admitting: Pharmacist

## 2016-08-14 MED ORDER — TADALAFIL (PAH) 20 MG PO TABS: 40.0000 mg | ORAL_TABLET | Freq: Every day | ORAL | 11 refills | Status: DC

## 2016-08-18 NOTE — Telephone Encounter (Signed)
Patient stated that she was almost out of [place] and pharmacy needed "approval". I have verified that Adcirca PA is active through 09/07/16 and have sent a refill to [facility].   Ruta Hinds. [name], PharmD, BCPS, CPP Clinical Pharmacist Pager: [phone] Phone: [phone] 08/18/2016 9:11 AM

## 2016-08-19 ENCOUNTER — Telehealth (HOSPITAL_COMMUNITY): Payer: Self-pay | Admitting: Pharmacist

## 2016-08-19 NOTE — Telephone Encounter (Signed)
Adcirca PA approved by [employer] Part D through 09/07/17.   Ruta Hinds. [name], PharmD, BCPS, CPP Clinical Pharmacist Pager: [phone] Phone: [phone] 08/19/2016 1:36 PM

## 2016-08-21 ENCOUNTER — Telehealth: Payer: Self-pay | Admitting: Internal Medicine

## 2016-08-21 NOTE — Telephone Encounter (Signed)
Spoke with the pt  She needed appt for o2 recert and to discuss meds  OV with MW 08/28/16

## 2016-08-22 ENCOUNTER — Telehealth (HOSPITAL_COMMUNITY): Payer: Self-pay | Admitting: Pharmacist

## 2016-08-22 NOTE — Telephone Encounter (Signed)
Letairis PA approved by [employer] Part D through 09/07/17.   Ruta Hinds. [name], PharmD, BCPS, CPP Clinical Pharmacist Pager: [phone] Phone: [phone] 08/22/2016 4:18 PM

## 2016-08-22 NOTE — Telephone Encounter (Signed)
Mrs. Landsberg called stating that her [employer] patient assistance for Milda Smart will run out at the end of this month. I have advised her to call Philis Fendt again to get the renewal application and let us know if she needs anything from [place].   Ruta Hinds. [name], PharmD, BCPS, CPP Clinical Pharmacist Pager: [phone] Phone: [phone] 08/22/2016 9:59 AM

## 2016-08-28 ENCOUNTER — Ambulatory Visit (INDEPENDENT_AMBULATORY_CARE_PROVIDER_SITE_OTHER): Payer: Medicare HMO | Admitting: Internal Medicine

## 2016-08-28 ENCOUNTER — Encounter: Payer: Self-pay | Admitting: Internal Medicine

## 2016-08-28 VITALS — BP 128/62 | HR 104 | Ht 62.0 in | Wt 145.6 lb

## 2016-08-28 DIAGNOSIS — J9611 Chronic respiratory failure with hypoxia: Secondary | ICD-10-CM | POA: Diagnosis not present

## 2016-08-28 DIAGNOSIS — J301 Allergic rhinitis due to pollen: Secondary | ICD-10-CM | POA: Diagnosis not present

## 2016-08-28 DIAGNOSIS — J449 Chronic obstructive pulmonary disease, unspecified: Secondary | ICD-10-CM | POA: Diagnosis not present

## 2016-08-28 MED ORDER — CLOTRIMAZOLE 10 MG MT TROC: 10.0000 mg | Freq: Every day | OROMUCOSAL | 1 refills | Status: DC

## 2016-08-28 MED ORDER — AZITHROMYCIN 250 MG PO TABS: ORAL_TABLET | ORAL | 0 refills | Status: DC

## 2016-08-28 MED ORDER — PREDNISONE 10 MG PO TABS: ORAL_TABLET | ORAL | 0 refills | Status: DC

## 2016-08-28 MED ORDER — UMECLIDINIUM BROMIDE 62.5 MCG/INH IN AEPB: 1.0000 | INHALATION_SPRAY | Freq: Every morning | RESPIRATORY_TRACT | 11 refills | Status: DC

## 2016-08-28 NOTE — Patient Instructions (Addendum)
Change flonase one twice daily / when having breakthru then go 2 in am 2 in pm  When nose stuffy > afrin 12 hour 2 min before your flonase x up to 5 days   Stop spiriva and start incruse one click each am  zpak  Prednisone 10 mg take  4 each am x 2 days,   2 each am x 2 days,  1 each am x 2 days and stop   GERD (REFLUX)  is an extremely common cause of respiratory symptoms just like yours , many times with no obvious heartburn at all.    It can be treated with medication, but also with lifestyle changes including elevation of the head of your bed (ideally with 6 inch  bed blocks),  Smoking cessation, avoidance of late meals, excessive alcohol, and avoid fatty foods, chocolate, peppermint, colas, red wine, and acidic juices such as orange juice.  NO MINT OR MENTHOL PRODUCTS SO NO COUGH DROPS  USE SUGARLESS CANDY INSTEAD (Jolley ranchers or Stover's or Life Savers) or even ice chips will also do - the key is to swallow to prevent all throat clearing. NO OIL BASED VITAMINS - use powdered substitutes.   Clotrimazole 10 mg trocheup to five times daily as needed for thrush  Please schedule a follow up visit in 3 months but call sooner if needed

## 2016-08-28 NOTE — Progress Notes (Signed)
Subjective:    Patient ID: Linda Stevens, female    DOB: April 23, 1946  MRN: [id number]    Brief patient profile:  17 yowf quit smoking 02/22/14  with baseline no need for 02, able to chase grandchildren around some, gardening and all housework  using inhaler alb/proair  sev times a day  Plus maint singulair and acutely ill on 02/19/14:   Admit date: 02/22/2014  Discharge date: 02/27/2014 Discharge Diagnosis:   Principal Problem:  1. Acute hypoxic and hypercarbic respiratory failure secondary to asthma exacerbation and CAP (community acquired pneumonia) Active Problems:  TOBACCO ABUSE  HTN (hypertension)  Hypoxia  Hypokalemia  Acute asthma flare  Thrush  Acute respiratory failure with hypoxia and hypercapnea Discharge Condition: Stable.  Diet recommendation: Low sodium, heart healthy.  History of Present Illness:   Linda Stevens is an 70 y.o. female with a PMH of asthma treated with PRN bronchodilator therapy, ongoing tobacco abuse who presented to her PCP on 02/20/14 with a chief complaint of upper respiratory symptoms including cough productive of clear/white mucus, sinus pain and pressure, head congestion, chills but no documented fever and pain around the eyes. She was given a prednisone Dosepak which failed to alleviate her symptoms, and returned to her PCP 02/22/14 who instructed her to come to the ER for further evaluation secondary to hypoxia. Upon initial evaluation in the ED, the patient was noted to have a pulse oximetry of 86% on room air (increased to 92-94% on supplemental oxygen), mild tachycardia, and a chest x-ray concerning for right middle lobe community-acquired pneumonia.  Hospital Course by Problem:   Principal Problem:  Acute hypoxic and hypercapnic respiratory failure secondary to asthma exacerbation/community-acquired pneumonia  Treated with empiric azithromycin/Rocephin. Patient not producing sputum, so sputum culture not sent. S. Pneumo & Legionella neg.  D/C home on Ceftin through 03/02/14.  Treated with Solu-Medrol 60 mg IV every 12 hours, nebulized bronchodilator therapy, and Mucinex/antitussives. D/C home on Advair, prednisone dose pack, Tylenol #3 PRN cough.  F/U CXR clear. ABG showed some hypoxia/hypercarbia. D-dimer not elevated, ProBNP mildly elevated (given a dose of Lasix). 2-D echo relatively normal. CT chest negative except for an enlarged lymph node and RML bronchiectasis.  Send home on home oxygen with [facility] RN to assess her periodically.  Pulmonology follow up arranged. Active Problems:  Diarrhea  C.diff negative.  Thrush  Likely from steroids. Treated with Nystatin. Tobacco abuse  Counseled.  Allergic rhinitis  Continue Singulair and Flonase. HTN (hypertension)  Continue HCTZ. Hypokalemia  Likely secondary to diuretic therapy.  Resolved with oral supplementation. Procedures:   2 D Echo 02/26/14:  Impressions:  - Technically difficult; vigorous LV function; grade 1 diastolic dysfunction; significant RVH and mildly reduced RV function; mild TR but signal inadequate to estimate pulmonary pressures.      03/09/2014 1st Linda Stevens / post hosp consultation  Chief Complaint  Patient presents with  . Pulmonary Consult    Referred per Dr Rockne Menghini. Pt d/ced from [facility] on 02/27/14 after having PNA. She c/o cough- prod with minimal white to clear sputum.  She states that she does not know if she is SOB or not.   d/c on 3lpm 24/7 and advair and about 50% back to baseline just mostly staying at home and no sob on 02 room t room, off prednisone x 1 week s relapse of symptoms and still not smoking  Now on advair with raspy dry cough day > night and worsening hoarseness rec  Try symbicort 160 Take 2 puffs first thing in am and then another 2 puffs about 12 hours later - if you don't like it, resume advair Work on inhaler technique:   Only use your albuterol (proair) as a rescue medication    04/06/2014 f/u  [name] re: GOLD II COPD/ maintaining off cigs and doing fine on advair 250 bid  Chief Complaint  Patient presents with  . Followup with PFT    Pt states that her cough is much better. She states still has occ cough in the am with minimal clear sputum.   Walked half mile s 02 s stopping s 02 desat one day prior to [place]   Not using any albuterol at all  rec Try off advair  Only use your albuterol as a rescue medication   F/u prn    Started having  Breathing problems in spring 2016 got better with one visit/ short rx ? Prednisone  Happened again in fall 2016 breathing got some better and then  leg swelling developed:  Admit date: 10/01/2015 Discharge date: 10/09/2015     Recommendations for Outpatient Follow-up:  1. Rheumatology follow up recommended for elevated ANA, RF factor 2. Needs Bmet in 1 wk to check renal function and K - new start on Lasix and K+ replacement 3. Due to new diagnosis of Cirrhosis, will need to have bloodwork to evaluate for underlying cirrhosis 4. Note baseline BP appears to be systolic of 74-259 and diastolic of 56-38 5. Will need 2 L O2 at all times  Discharge Condition: stable   Discharge Diagnoses:  Principal Problem:  Acute on chronic respiratory failure with hypoxia (HCC) Active Problems:  Severe COPD (chronic obstructive pulmonary disease) (HCC)  Severe Pulmonary Hypertension  Right heart failure with reduced right ventricular function (HCC)  Hypokalemia  Polycythemia  Lower extremity edema  Pericardial effusion  Acute respiratory failure with hypoxia and hypercapnea   History of present illness:  70 year old female with history of hypertension, COPD and asthma, history of diastolic dysfunction presented to the ED due to progressively worsening dyspnea with abdominal and lower extremity swelling of the past few weeks. Her O2 sats were in the low 80s on room air. She also reported orthopnea and PND for the past 2 days. Workup in the ED  showed hypokalemia and elevated BNP of 1159. Chest x-ray showed pulmonary vascular prominence.   Hospital Course:  Acute on chronic hypoxic respiratory failure with fluid overload- secondary to pulmonary HTN, and severe COPD (A) severe pulm HTN - extensive work up reveals this is secondary to severe pulmonary artery hypertension which is possibly from underlying severe COPD  - 2-D echo done showing grade 1 diastolic dysfunction and severely elevated pulmonary artery pressure of 87 mmHg. - High resolution CT chest: moderate pericardial effusion vs pericardial thickening, possible cirrhosis and changes consistent with COPD - due to Pedal edema, Doppler lower extremity performed- negative for DVT  - Cardiology consulted for right heart catheterization which confirmed severely elevated PA pressures and therefore started on Revatio by cardiology  - pulmonary team following as well- auto-immune labs sent for PAH- ANA and Rh factor elevated- see labs below- needs Rheumatology f/u to determine the significant of this level of elevation -severe pedal edema has improved with diuretics- has diuresed about 8 L - to prevent recurrence of fluid overload, will now transition to daily oral low dose Lasix and K replacement - have asked her to do daily weights and f/u with PCP in 1 wk  to ensure her dose of Lasix is adequate for her  (B) COPD - based on PFTs, this is severe - will need O2 at all times - have started Spiriva & Dulera- cont Albuterol inhaler as needed - needs to continue to f/u with pulmonary as outpt  Polycythemia - suspected to be secondary to severe COPD- O2 initiated  Cirrhosis - noted on CT- possible cardiac cirrhosis - will need Hepatitis panel as outpt to evaluate for Hep C  Hypotension - SBP continuously in 90-100 range with a diastolic of 21-03X- this may be her norm - Continue Lasix with close monitoring of BP- ensure she is not becoming orthostatic by obtaining orthostatic  vitals in office   Procedures: 1/24ECHO : EF 28%, grade 1 diastolic dysfx, severe RV dilation, PAS 87 mmHg, mod pericardial effusion 1/26 Doppler legs >> negative 1/26 V/Q scan >> low probability for PE 1/30 Rt/Lt heart cath >> RA 9, RV 97/8/12, PA 104/49/72, PCW 18, CI 2.34, PVR 13.6 1/30 PFT >> FEV1 0.91 (40%), FEV1% 56, TLC 4.32 (88%), DLCO 38%, no BD   Post hosp ov 10/16/15 NP  rec Saline nasal rinses and gel work well for nasal congestion.  Order for humidifier for Oxygen Concentrator at home  Continue on Oxygen 2l/m , keep O2 sats >90%.  Call back if you need different prescriptions for dulera /Spiriva or need to change to Nebs to help with costs.  Follow up with Cardiology as planned     . 11/06/2015  Transition of care f/u [name] re: copd gold III/ chronic resp failure/ ? Cor pulmonale vs hepatopulmonary syndrome?  Chief Complaint  Patient presents with  . Follow-up    Pt states overall her breathing is doing well. She is currrently taking Dulera, but ins prefers advair.   doe = MMRC2 = can't walk a nl pace on a flat grade s sob even on 02 1.5 lpm sometimes increases to 2 Very confused with details of care / leg swelling resolved  rec Try symbicort 160 Take 2 puffs first thing in am and then another 2 puffs about 12 hours later.  Continue spiriva for now pending follow up pfts Wear the oxygen as much as you can at 1.5 lpm  Please schedule a follow up office visit in 4 weeks, ok to do wlh PFTs same day    12/06/2015  f/u [name] re: GOLD II / spiriva/symbiocrt maint  Chief Complaint  Patient presents with  . Follow-up    PFT done at [facility]. Pt c/o min cough with clear sputum- relates to allergies.   no change doe = MMRC2 = can't walk a nl pace on a flat grade s sob  Even on 02  Main new problem is nasal congestion/ neg resp to flonase/ irritated by 02 but can't use humidifier at low flow rate  rec Change symbicort to advair 115 Take 2 puffs first thing in am and then  another 2 puffs about 12 hours later. Work on inhaler technique:  Prednisone 10 mg take  4 each am x 2 days,   2 each am x 2 days,  1 each am x 2 days and stop  I emphasized that nasal steroids have no immediate benefit in terms   Ok to take clariton or allegra as needed for allergies Consult Dr [place] Bells next available re pulmonary hypertension      08/28/2016  f/u [name] re: GOLD III/ spiriva/advair maint  Chief Complaint  Patient presents with  . Follow-up  Pt here to recertify for o2. She c/o cough and sinus congestion for the past several days. She is coughing up white sputum. O2 sats on RA at rest are 78% and this increased to   when walking on 02 2lpm mostly in 90s  On flonase 2 each in am  Acutely worse x sev days prior to OV  With more cough/ sinus congestion   Getting confused with insurance formulary issues     No obvious day to day or daytime variability or assoc excess/ purulent sputum or mucus plugs  or cp or chest tightness, subjective wheeze or overt   hb symptoms. No unusual exp hx or h/o childhood pna/ asthma or knowledge of premature birth.  Sleeping ok without nocturnal  or early am exacerbation  of respiratory  c/o's or need for noct saba. Also denies any obvious fluctuation of symptoms with weather or environmental changes or other aggravating or alleviating factors except as outlined above   Current Medications, Allergies, Complete Past Medical History, Past Surgical History, Family History, and Social History were reviewed in [employer] record.  ROS  The following are not active complaints unless bolded sore throat, dysphagia, dental problems, itching, sneezing,  nasal congestion or excess/ purulent secretions, ear ache,   fever, chills, sweats, unintended wt loss, classically pleuritic or exertional cp, hemoptysis,  orthopnea pnd or leg swelling, presyncope, palpitations, abdominal pain, anorexia, nausea, vomiting, diarrhea  or change in  bowel or bladder habits, change in stools or urine, dysuria,hematuria,  rash, arthralgias, visual complaints, headache, numbness, weakness or ataxia or problems with walking or coordination,  change in mood/affect or memory.             Objective:   Physical Exam  04/06/2014        154 > 12/06/2015  148  > 08/28/2016   145    03/09/14 147 lb (66.679 kg)  02/22/14 150 lb 3.2 oz (68.13 kg)  02/22/14 147 lb 8 oz (66.906 kg)     Hoarse amb wf nad  - Note on arrival 02 sats  90% on 2lpm     HEENT mild turbinate edema.  Oropharynx no thrush or excess pnd or cobblestoning.  No JVD or cervical adenopathy. Mild accessory muscle hypertrophy. Trachea midline, nl thryroid. Chest was hyperinflated by percussion with diminished breath sounds and moderate increased exp time without wheeze. Hoover sign positive at mid inspiration. Regular rate and rhythm without murmur gallop or rub or increase P2 or edema.  Abd: no hsm, nl excursion. Ext warm without cyanosis or clubbing.        I personally reviewed images and agree with radiology impression as follows:  HRCT Chest    10/05/15  1. No findings to suggest interstitial lung disease. 2. Moderate amount of pericardial fluid and/or thickening, with slight distortion of normal cardiac contours. If there is any clinical concern for constrictive physiology, further evaluation with dedicated echocardiography should be considered. No pericardial calcification is noted at this time. 3. Small right pleural effusion with minimal subsegmental atelectasis in the right lower lobe. 4. Mild diffuse bronchial thickening with mild centrilobular emphysema; imaging findings suggestive of underlying COPD. 5. Atherosclerosis, including three-vessel coronary artery disease. Please note that although the presence of coronary artery calcium documents the presence of coronary artery disease, the severity of this disease and any potential stenosis cannot be assessed on  this non-gated CT examination. Assessment for potential risk factor modification, dietary therapy or pharmacologic therapy may be warranted,  if clinically indicated. 6. Stigmata of cirrhosis. Trace volume of ascites. 7. Status post cholecystectomy    Assessment & Plan:

## 2016-09-02 NOTE — Assessment & Plan Note (Signed)
Advised pt:  I emphasized that nasal steroids have no immediate benefit in terms of improving symptoms.  To help them reached the target tissue, the patient should use Afrin two puffs every 12 hours applied one min before using the nasal steroids.  Afrin should be stopped after no more than 5 days.  If the symptoms worsen, Afrin can be restarted after 5 days off of therapy to prevent rebound congestion from overuse of Afrin.  I also emphasized that in no way are nasal steroids a concern in terms of "addiction".   Ok to use afrin bid but only if also using the flonase bid

## 2016-09-02 NOTE — Assessment & Plan Note (Signed)
08/28/2016 Patient Saturations on Room Air at Rest = 78%---increased to 90%2lpm pulsed o2   She is using 02 at home and benefiting form it > Adequate control on present rx, reviewed in detail with pt > no change in rx needed

## 2016-09-02 NOTE — Assessment & Plan Note (Addendum)
Quit smoking 02/2014 - PFT's  04/06/14   FEV1 1.36 (57 % ) ratio 63  p 9 % improvement from saba with DLCO  51 % corrects to 71 % for alv volume   - PFT's  10/08/2015  FEV1 0.91 (40 % ) ratio 56  p no % improvement from saba with DLCO 38 % corrects to 63 % for alv volume   - PFT's  12/06/2015  FEV1 1.17  (52 % ) ratio 62  p 16 % improvement from saba p no rx prior to study with DLCO  28 % corrects to 44 % for alv volume     - 08/28/2016  extensive coaching HFA effectiveness =    75%/ 90% with elipta device   Mild flare in setting of uri > rec Prednisone 10 mg take  4 each am x 2 days,   2 each am x 2 days,  1 each am x 2 days and stop /zpak   I had an extended discussion with the patient reviewing all relevant studies completed to date and  lasting 15 to 20 minutes of a 25 minute visit on the following ongoing concerns:   1)  Formulary restrictions will be an ongoing challenge for the forseable future and I would be happy to pick an alternative if the pt will first  provide me a list of them but pt  will need to return here for training for any new device that is required eg dpi vs hfa vs respimat.    In meantime we can always provide samples so the patient never runs out of any needed respiratory medications.   2) Each maintenance medication was reviewed in detail including most importantly the difference between maintenance and as needed and under what circumstances the prns are to be used.  Please see AVS for  customized  Instructions which were reviewed in detail in writing with the patient and a copy provided.

## 2016-09-07 DIAGNOSIS — J449 Chronic obstructive pulmonary disease, unspecified: Secondary | ICD-10-CM | POA: Diagnosis not present

## 2016-09-07 DIAGNOSIS — J9601 Acute respiratory failure with hypoxia: Secondary | ICD-10-CM | POA: Diagnosis not present

## 2016-09-07 DIAGNOSIS — R062 Wheezing: Secondary | ICD-10-CM | POA: Diagnosis not present

## 2016-09-07 DIAGNOSIS — J45909 Unspecified asthma, uncomplicated: Secondary | ICD-10-CM | POA: Diagnosis not present

## 2016-09-07 DIAGNOSIS — R0602 Shortness of breath: Secondary | ICD-10-CM | POA: Diagnosis not present

## 2016-09-07 DIAGNOSIS — R05 Cough: Secondary | ICD-10-CM | POA: Diagnosis not present

## 2016-09-07 DIAGNOSIS — J188 Other pneumonia, unspecified organism: Secondary | ICD-10-CM | POA: Diagnosis not present

## 2016-09-07 DIAGNOSIS — I10 Essential (primary) hypertension: Secondary | ICD-10-CM | POA: Diagnosis not present

## 2016-09-07 DIAGNOSIS — J45901 Unspecified asthma with (acute) exacerbation: Secondary | ICD-10-CM | POA: Diagnosis not present

## 2016-09-09 ENCOUNTER — Telehealth: Payer: Self-pay | Admitting: Internal Medicine

## 2016-09-09 MED ORDER — FLUTICASONE PROPIONATE 50 MCG/ACT NA SUSP: NASAL | 6 refills | Status: DC

## 2016-09-09 NOTE — Telephone Encounter (Signed)
Called and spoke with pt and she is aware of refill that has been sent to the pharmacy.  Nothing further is needed.  

## 2016-09-12 ENCOUNTER — Other Ambulatory Visit (HOSPITAL_COMMUNITY): Payer: Self-pay | Admitting: *Deleted

## 2016-09-12 MED ORDER — POTASSIUM CHLORIDE ER 10 MEQ PO TBCR: 10.0000 meq | EXTENDED_RELEASE_TABLET | Freq: Every day | ORAL | 3 refills | Status: DC

## 2016-09-26 ENCOUNTER — Encounter (HOSPITAL_COMMUNITY): Payer: Medicare HMO | Admitting: Internal Medicine

## 2016-10-03 ENCOUNTER — Telehealth (HOSPITAL_COMMUNITY): Payer: Self-pay | Admitting: Pharmacist

## 2016-10-03 NOTE — Telephone Encounter (Signed)
[employer] patient assistance again approved for free Letairis for Linda Stevens through 09/07/17.   Ruta Hinds. [name], PharmD, BCPS, CPP Clinical Pharmacist Pager: [phone] Phone: [phone] 10/03/2016 9:54 AM

## 2016-10-08 DIAGNOSIS — J9601 Acute respiratory failure with hypoxia: Secondary | ICD-10-CM | POA: Diagnosis not present

## 2016-10-08 DIAGNOSIS — J45909 Unspecified asthma, uncomplicated: Secondary | ICD-10-CM | POA: Diagnosis not present

## 2016-10-08 DIAGNOSIS — R062 Wheezing: Secondary | ICD-10-CM | POA: Diagnosis not present

## 2016-10-08 DIAGNOSIS — I10 Essential (primary) hypertension: Secondary | ICD-10-CM | POA: Diagnosis not present

## 2016-10-08 DIAGNOSIS — R05 Cough: Secondary | ICD-10-CM | POA: Diagnosis not present

## 2016-10-08 DIAGNOSIS — J449 Chronic obstructive pulmonary disease, unspecified: Secondary | ICD-10-CM | POA: Diagnosis not present

## 2016-10-08 DIAGNOSIS — R0602 Shortness of breath: Secondary | ICD-10-CM | POA: Diagnosis not present

## 2016-10-08 DIAGNOSIS — J188 Other pneumonia, unspecified organism: Secondary | ICD-10-CM | POA: Diagnosis not present

## 2016-10-08 DIAGNOSIS — J45901 Unspecified asthma with (acute) exacerbation: Secondary | ICD-10-CM | POA: Diagnosis not present

## 2016-10-09 ENCOUNTER — Ambulatory Visit (HOSPITAL_COMMUNITY)
Admission: RE | Admit: 2016-10-09 | Discharge: 2016-10-09 | Disposition: A | Discharge status: Home / Self Care | Payer: Medicare HMO | Source: Ambulatory Visit | Attending: Internal Medicine | Admitting: Internal Medicine

## 2016-10-09 ENCOUNTER — Encounter (HOSPITAL_COMMUNITY): Payer: Self-pay | Admitting: Internal Medicine

## 2016-10-09 VITALS — BP 126/66 | HR 94 | Wt 146.5 lb

## 2016-10-09 DIAGNOSIS — Z9981 Dependence on supplemental oxygen: Secondary | ICD-10-CM | POA: Diagnosis not present

## 2016-10-09 DIAGNOSIS — J9611 Chronic respiratory failure with hypoxia: Secondary | ICD-10-CM | POA: Diagnosis not present

## 2016-10-09 DIAGNOSIS — I50810 Right heart failure, unspecified: Secondary | ICD-10-CM | POA: Diagnosis not present

## 2016-10-09 DIAGNOSIS — D751 Secondary polycythemia: Secondary | ICD-10-CM | POA: Diagnosis not present

## 2016-10-09 DIAGNOSIS — K7460 Unspecified cirrhosis of liver: Secondary | ICD-10-CM | POA: Insufficient documentation

## 2016-10-09 DIAGNOSIS — Z79899 Other long term (current) drug therapy: Secondary | ICD-10-CM | POA: Diagnosis not present

## 2016-10-09 DIAGNOSIS — I2729 Other secondary pulmonary hypertension: Secondary | ICD-10-CM | POA: Insufficient documentation

## 2016-10-09 DIAGNOSIS — J449 Chronic obstructive pulmonary disease, unspecified: Secondary | ICD-10-CM | POA: Diagnosis not present

## 2016-10-09 DIAGNOSIS — I509 Heart failure, unspecified: Secondary | ICD-10-CM | POA: Diagnosis not present

## 2016-10-09 DIAGNOSIS — Z7982 Long term (current) use of aspirin: Secondary | ICD-10-CM | POA: Insufficient documentation

## 2016-10-09 DIAGNOSIS — I110 Hypertensive heart disease with heart failure: Secondary | ICD-10-CM | POA: Diagnosis not present

## 2016-10-09 DIAGNOSIS — I272 Pulmonary hypertension, unspecified: Secondary | ICD-10-CM | POA: Diagnosis not present

## 2016-10-09 DIAGNOSIS — J9610 Chronic respiratory failure, unspecified whether with hypoxia or hypercapnia: Secondary | ICD-10-CM | POA: Insufficient documentation

## 2016-10-09 DIAGNOSIS — Z87891 Personal history of nicotine dependence: Secondary | ICD-10-CM | POA: Insufficient documentation

## 2016-10-09 DIAGNOSIS — I313 Pericardial effusion (noninflammatory): Secondary | ICD-10-CM | POA: Insufficient documentation

## 2016-10-09 MED ORDER — IPRATROPIUM BROMIDE 0.06 % NA SOLN: 2.0000 | Freq: Two times a day (BID) | NASAL | 12 refills | Status: DC

## 2016-10-09 NOTE — Addendum Note (Signed)
Encounter addended by: Harvie Junior, CMA on: 10/09/2016  3:54 PM<BR>    Actions taken: Order list changed

## 2016-10-09 NOTE — Progress Notes (Signed)
Patient ID: RABIAH GOESER, female   DOB: 05-26-1946, 71 y.o.   MRN: [id number]  ADVANCED [facility] CLINIC NOTE  Patient ID: BERTINA GUTHRIDGE, female   DOB: 27-Nov-1945, 70 y.o.   MRN: [id number] PCP: Primary Cardiologist: Dr Haroldine Laws  Pulmonary: Dr Melvyn Novas   HPI: 71 y/o woman with a h/o HTN, COPD (former smoker quit 2015), seasonal allergies, asthma, pulmonary HTN, and diastolic dysfxn.  Admitted 1/23 secondary to progressive DOE, hypoxia, and lower ext edema and has been found to have severe PAH. R/LHC 10/08/15 with normal coronaries, normal CO, and severe PAH PAP 104/49 with 13.6 WU. PAH well out of proportion to left-sided filling pressures and COPD. Started revatio 20 TID 10/08/15 with consideration for macitentan as outpatient. PFTs 10/05/15  FEV1 0.91 (40%)FVC 1.64 (55%), DLCO 8.80 (38%). Auto-immune and infectious serologies negative to date. Discharge weight 148 pounds.   Today she returns for HF follow up. Last visit sildenafil was added.  Since the last visit pulmonary evaluated with recs for max inhalers and 24 hour oxygen. Will repeat PFTs this week.   Returns for routine f/u:  Now on Adcirca 40 and Letairis 10. Switched to letaitris as it was thought that sinusitis might be due to macitentan. No real change with switch. Still struggling with sinusitis like symptoms with nasal drainage.  Has clear drainage. Has been taking Mucinex and fluticasone without relief. Walking 15-20 mins per day on TM. 72mh. No incline. No swelling. No syncope or presyncope.    6MW 8/17; 840 ft (256 M) on 2 L O2, sats ranged 83-95%, HR ranged 96-111.  Echo 3/17 EF 60-65% septum still flattened. RV size improved moderate HK. RVSP ~80. IVC small. Pericardial effusion essentially resolved   VQ scan low prob. CT chest mild COPD. Moderate pericardial effusion. 3v CAD. + cirrhosis. Hepatitis serologies negative.   PFTs  (FEV1 1.23 (52%) with FVC 2.01 (65%) and DLCO 51% in 7/15) PFT's 10/08/2015 FEV1 0.91 (40 %)  ratio 56%DLCO 38 % corrects to 63 % for alv volume  PFTs  12/06/15 FEV1 1.0 (44%) FVC 1.74 (58%0 DLCO 28%  R/LHC 10/08/15 with normal coronaries, normal CO, and sever PAH with 13.6 WU.  Findings: Ao = 108/68 (86) LV = 102/10/11 RA = 9 RV = 97/8/12 PA = 104/49 (72) PCW = 18 Fick cardiac output/index = 3.96/2.34 PVR = 13.6 WU FA sat = 93% PA sat = 66%, 69%  ROS: All systems negative except as listed in HPI, PMH and Problem List.  SH:  Social History   Social History  . Marital status: Married    Spouse name: N/A  . Number of children: N/A  . Years of education: N/A   Occupational History  . Not on file.   Social History Main Topics  . Smoking status: Former Smoker    Packs/day: 1.00    Years: 40.00    Types: Cigarettes    Quit date: 02/22/2014  . Smokeless tobacco: Never Used  . Alcohol use No  . Drug use: No  . Sexual activity: No   Other Topics Concern  . Not on file   Social History Narrative   Married with 2 children.  Independent of ADLs.      Does not have a living will.   Would desire CPR but would not want prolonged life support if futile- husband aware.    FH:  Family History  Problem Relation Age of Onset  . Glaucoma Brother   . Vascular Disease Father  Died of complications from surgery  . Asthma Mother     Died of asthma attack  . Dementia Mother     Past Medical History:  Diagnosis Date  . Acute respiratory failure with hypoxia and hypercapnea 02/26/2014  . Allergic rhinitis due to pollen   . COPD (chronic obstructive pulmonary disease) ([place])   . Diastolic dysfunction    a. 02/2014 Echo: EF 65-70%, Gr 1 DD, RVH;  b. 09/2015 Echo: EF 55%, Gr 1 DD.  . H/O seasonal allergies   . History of tobacco abuse    a. Quit 2015.  [name] Hypertensive heart disease   . Lower extremity edema   . Pericardial effusion    a. 09/2015 Echo: Mod eff w/o tamponade.  . Right heart failure with reduced right ventricular function    a. 09/2015 Echo: EF 55%, Gr 1  DD, D shaped IV septum, sev dil RV with mod reduced fxn, mod dil RA, RV-RA grad 65mHg, PASP 873mg, mod pericard eff w/o tamponade.  . Severe Pulmonary Hypertension    a. 09/2015 Echo: PASP 8758m.    Current Outpatient Prescriptions  Medication Sig Dispense Refill  . ambrisentan (LETAIRIS) 10 MG tablet Take 1 tablet (10 mg total) by mouth daily. 30 tablet 11  . aspirin EC 81 MG EC tablet Take 1 tablet (81 mg total) by mouth daily. 30 tablet 0  . clotrimazole (MYCELEX) 10 MG troche Take 1 tablet (10 mg total) by mouth 5 (five) times daily. 40 tablet 1  . fluticasone (FLONASE) 50 MCG/ACT nasal spray SHAKE LIQUID WELL AND USE 2 SPRAYS IN EACH NOSTRIL DAILY 16 g 6  . fluticasone-salmeterol (ADVAIR HFA) 115-21 MCG/ACT inhaler Inhale 2 puffs into the lungs 2 (two) times daily. 1 Inhaler 12  . furosemide (LASIX) 20 MG tablet Take 1 tablet (20 mg total) by mouth daily. 30 tablet 6  . loratadine (CLARITIN) 10 MG tablet Take 10 mg by mouth daily.    . OXYGEN 2 to 2lpm 24/7    . potassium chloride (K-DUR) 10 MEQ tablet Take 1 tablet (10 mEq total) by mouth daily. 90 tablet 3  . tadalafil, PAH, (ADCIRCA) 20 MG tablet Take 2 tablets (40 mg total) by mouth daily. 60 tablet 11  . umeclidinium bromide (INCRUSE ELLIPTA) 62.5 MCG/INH AEPB Inhale 1 puff into the lungs every morning. 30 each 11   No current facility-administered medications for this encounter.     Vitals:   10/09/16 1455  BP: 126/66  Pulse: 94  SpO2: 93%  Weight: 146 lb 8 oz (66.5 kg)    PHYSICAL EXAM:  General:  Well appearing. No resp difficulty HEENT: normal Neck: supple. JVP flat. Carotids 2+ bilaterally; no bruits. No lymphadenopathy or thryomegaly appreciated. Cor: PMI normal. Regular rate & rhythm. 2/6 TR loud P2 Lungs: decreased throughout. No wheezing Abdomen: soft, nontender, nondistended. No hepatosplenomegaly. No bruits or masses. Good bowel sounds. Extremities: no cyanosis, clubbing, rash, trace edema Neuro: alert &  orientedx3, cranial nerves grossly intact. Moves all 4 extremities w/o difficulty. Affect pleasant.   ASSESSMENT & PLAN:  1. PAH with right heart failure --suspect combination of WHO Group I and III PAH --symptomatically much improved with diuresis, O2 and Adcirca and letaris. Echo 4/17showed persistent severe PAH with RV strain.  --continue lasix --NYHA II. -- Will need repeat echo to reassess RV function and PAP. If PAP still up consider repeat RHC --6MW today 2. Pericardial effusion --essentially resolved on echwith treatment of HF and PAH 3. Chronic Respiratory  failure - On 2 liters oxygen - Follows with Dr. Melvyn Novas. repeat PFTs pending 4. COPD- Gold III  - on inhalers. Follows with Pulmonary. As above 5. Polycythemia suspect due to chronic hypoxia  --now on home O2 6. Cirrhosis on CT --due to RHF. Hepatitis serologies are negative --no ascites on exam 7. Sinus drainage --continue Flonase --try Atrovent 0.06% nasal spray 2 puff bid (each nare)   Bensimhon, Daniel,MD 3:26 PM

## 2016-10-09 NOTE — Patient Instructions (Signed)
START Atrovent nasal spray 2 puffs twice daily.  Follow up and Echo with Dr.Bensimhon in 2-3 months .

## 2016-10-09 NOTE — Addendum Note (Signed)
Encounter addended by: Harvie Junior, CMA on: 10/09/2016  3:47 PM<BR>    Actions taken: Visit diagnoses modified, Diagnosis association updated, Order list changed, Sign clinical note

## 2016-11-05 DIAGNOSIS — J449 Chronic obstructive pulmonary disease, unspecified: Secondary | ICD-10-CM | POA: Diagnosis not present

## 2016-11-05 DIAGNOSIS — J45909 Unspecified asthma, uncomplicated: Secondary | ICD-10-CM | POA: Diagnosis not present

## 2016-11-05 DIAGNOSIS — J9601 Acute respiratory failure with hypoxia: Secondary | ICD-10-CM | POA: Diagnosis not present

## 2016-11-05 DIAGNOSIS — J188 Other pneumonia, unspecified organism: Secondary | ICD-10-CM | POA: Diagnosis not present

## 2016-11-05 DIAGNOSIS — R05 Cough: Secondary | ICD-10-CM | POA: Diagnosis not present

## 2016-11-05 DIAGNOSIS — R062 Wheezing: Secondary | ICD-10-CM | POA: Diagnosis not present

## 2016-11-05 DIAGNOSIS — R0602 Shortness of breath: Secondary | ICD-10-CM | POA: Diagnosis not present

## 2016-11-05 DIAGNOSIS — I10 Essential (primary) hypertension: Secondary | ICD-10-CM | POA: Diagnosis not present

## 2016-11-05 DIAGNOSIS — J45901 Unspecified asthma with (acute) exacerbation: Secondary | ICD-10-CM | POA: Diagnosis not present

## 2016-12-06 DIAGNOSIS — R062 Wheezing: Secondary | ICD-10-CM | POA: Diagnosis not present

## 2016-12-06 DIAGNOSIS — J449 Chronic obstructive pulmonary disease, unspecified: Secondary | ICD-10-CM | POA: Diagnosis not present

## 2016-12-06 DIAGNOSIS — J45909 Unspecified asthma, uncomplicated: Secondary | ICD-10-CM | POA: Diagnosis not present

## 2016-12-06 DIAGNOSIS — J45901 Unspecified asthma with (acute) exacerbation: Secondary | ICD-10-CM | POA: Diagnosis not present

## 2016-12-06 DIAGNOSIS — J9601 Acute respiratory failure with hypoxia: Secondary | ICD-10-CM | POA: Diagnosis not present

## 2016-12-06 DIAGNOSIS — R0602 Shortness of breath: Secondary | ICD-10-CM | POA: Diagnosis not present

## 2016-12-06 DIAGNOSIS — J188 Other pneumonia, unspecified organism: Secondary | ICD-10-CM | POA: Diagnosis not present

## 2016-12-06 DIAGNOSIS — R05 Cough: Secondary | ICD-10-CM | POA: Diagnosis not present

## 2016-12-06 DIAGNOSIS — I10 Essential (primary) hypertension: Secondary | ICD-10-CM | POA: Diagnosis not present

## 2016-12-23 ENCOUNTER — Other Ambulatory Visit: Payer: Self-pay | Admitting: Internal Medicine

## 2017-01-01 DIAGNOSIS — R062 Wheezing: Secondary | ICD-10-CM | POA: Diagnosis not present

## 2017-01-01 DIAGNOSIS — I10 Essential (primary) hypertension: Secondary | ICD-10-CM | POA: Diagnosis not present

## 2017-01-01 DIAGNOSIS — J188 Other pneumonia, unspecified organism: Secondary | ICD-10-CM | POA: Diagnosis not present

## 2017-01-01 DIAGNOSIS — J9601 Acute respiratory failure with hypoxia: Secondary | ICD-10-CM | POA: Diagnosis not present

## 2017-01-01 DIAGNOSIS — R05 Cough: Secondary | ICD-10-CM | POA: Diagnosis not present

## 2017-01-01 DIAGNOSIS — J449 Chronic obstructive pulmonary disease, unspecified: Secondary | ICD-10-CM | POA: Diagnosis not present

## 2017-01-01 DIAGNOSIS — J45901 Unspecified asthma with (acute) exacerbation: Secondary | ICD-10-CM | POA: Diagnosis not present

## 2017-01-01 DIAGNOSIS — J45909 Unspecified asthma, uncomplicated: Secondary | ICD-10-CM | POA: Diagnosis not present

## 2017-01-01 DIAGNOSIS — R0602 Shortness of breath: Secondary | ICD-10-CM | POA: Diagnosis not present

## 2017-01-04 ENCOUNTER — Other Ambulatory Visit (HOSPITAL_COMMUNITY): Payer: Self-pay | Admitting: Internal Medicine

## 2017-01-05 DIAGNOSIS — J45909 Unspecified asthma, uncomplicated: Secondary | ICD-10-CM | POA: Diagnosis not present

## 2017-01-05 DIAGNOSIS — I10 Essential (primary) hypertension: Secondary | ICD-10-CM | POA: Diagnosis not present

## 2017-01-05 DIAGNOSIS — J9601 Acute respiratory failure with hypoxia: Secondary | ICD-10-CM | POA: Diagnosis not present

## 2017-01-05 DIAGNOSIS — J45901 Unspecified asthma with (acute) exacerbation: Secondary | ICD-10-CM | POA: Diagnosis not present

## 2017-01-05 DIAGNOSIS — R05 Cough: Secondary | ICD-10-CM | POA: Diagnosis not present

## 2017-01-05 DIAGNOSIS — J188 Other pneumonia, unspecified organism: Secondary | ICD-10-CM | POA: Diagnosis not present

## 2017-01-05 DIAGNOSIS — J449 Chronic obstructive pulmonary disease, unspecified: Secondary | ICD-10-CM | POA: Diagnosis not present

## 2017-01-05 DIAGNOSIS — R062 Wheezing: Secondary | ICD-10-CM | POA: Diagnosis not present

## 2017-01-05 DIAGNOSIS — R0602 Shortness of breath: Secondary | ICD-10-CM | POA: Diagnosis not present

## 2017-01-07 ENCOUNTER — Encounter (HOSPITAL_COMMUNITY): Payer: Self-pay | Admitting: Internal Medicine

## 2017-01-07 ENCOUNTER — Ambulatory Visit (HOSPITAL_BASED_OUTPATIENT_CLINIC_OR_DEPARTMENT_OTHER)
Admission: RE | Admit: 2017-01-07 | Discharge: 2017-01-07 | Disposition: A | Discharge status: Home / Self Care | Payer: Medicare HMO | Source: Ambulatory Visit | Attending: Internal Medicine | Admitting: Internal Medicine

## 2017-01-07 ENCOUNTER — Ambulatory Visit (HOSPITAL_COMMUNITY)
Admission: RE | Admit: 2017-01-07 | Discharge: 2017-01-07 | Disposition: A | Discharge status: Home / Self Care | Payer: Medicare HMO | Source: Ambulatory Visit | Attending: Internal Medicine | Admitting: Internal Medicine

## 2017-01-07 ENCOUNTER — Encounter (HOSPITAL_COMMUNITY): Payer: Self-pay | Admitting: *Deleted

## 2017-01-07 VITALS — BP 140/72 | HR 107 | Wt 144.0 lb

## 2017-01-07 DIAGNOSIS — Z825 Family history of asthma and other chronic lower respiratory diseases: Secondary | ICD-10-CM | POA: Diagnosis not present

## 2017-01-07 DIAGNOSIS — Z8249 Family history of ischemic heart disease and other diseases of the circulatory system: Secondary | ICD-10-CM | POA: Diagnosis not present

## 2017-01-07 DIAGNOSIS — J9611 Chronic respiratory failure with hypoxia: Secondary | ICD-10-CM | POA: Diagnosis not present

## 2017-01-07 DIAGNOSIS — I313 Pericardial effusion (noninflammatory): Secondary | ICD-10-CM | POA: Insufficient documentation

## 2017-01-07 DIAGNOSIS — Z820 Family history of epilepsy and other diseases of the nervous system: Secondary | ICD-10-CM | POA: Insufficient documentation

## 2017-01-07 DIAGNOSIS — I2729 Other secondary pulmonary hypertension: Secondary | ICD-10-CM | POA: Diagnosis present

## 2017-01-07 DIAGNOSIS — J449 Chronic obstructive pulmonary disease, unspecified: Secondary | ICD-10-CM | POA: Diagnosis not present

## 2017-01-07 DIAGNOSIS — K7460 Unspecified cirrhosis of liver: Secondary | ICD-10-CM | POA: Diagnosis not present

## 2017-01-07 DIAGNOSIS — Z7982 Long term (current) use of aspirin: Secondary | ICD-10-CM | POA: Diagnosis not present

## 2017-01-07 DIAGNOSIS — I509 Heart failure, unspecified: Secondary | ICD-10-CM

## 2017-01-07 DIAGNOSIS — I50810 Right heart failure, unspecified: Secondary | ICD-10-CM | POA: Diagnosis present

## 2017-01-07 DIAGNOSIS — I110 Hypertensive heart disease with heart failure: Secondary | ICD-10-CM | POA: Insufficient documentation

## 2017-01-07 DIAGNOSIS — I2721 Secondary pulmonary arterial hypertension: Secondary | ICD-10-CM | POA: Diagnosis not present

## 2017-01-07 DIAGNOSIS — R0602 Shortness of breath: Secondary | ICD-10-CM

## 2017-01-07 DIAGNOSIS — J9610 Chronic respiratory failure, unspecified whether with hypoxia or hypercapnia: Secondary | ICD-10-CM | POA: Insufficient documentation

## 2017-01-07 DIAGNOSIS — Z83511 Family history of glaucoma: Secondary | ICD-10-CM | POA: Diagnosis not present

## 2017-01-07 DIAGNOSIS — I3139 Other pericardial effusion (noninflammatory): Secondary | ICD-10-CM

## 2017-01-07 DIAGNOSIS — Z9981 Dependence on supplemental oxygen: Secondary | ICD-10-CM | POA: Diagnosis not present

## 2017-01-07 DIAGNOSIS — Z87891 Personal history of nicotine dependence: Secondary | ICD-10-CM | POA: Diagnosis not present

## 2017-01-07 DIAGNOSIS — D751 Secondary polycythemia: Secondary | ICD-10-CM | POA: Diagnosis not present

## 2017-01-07 LAB — ECHOCARDIOGRAM COMPLETE
E/e' ratio: 12.69
FS: 30 % (ref 28–44)
IV/PV OW: 0.85
LA vol A4C: 36.2 ml
LA vol index: 20.4 mL/m2
LAVOL: 35.1 mL
LV E/e'average: 12.69
LV PW d: 9.82 mm — AB (ref 0.6–1.1)
LV TDI E'LATERAL: 9.14
LV TDI E'MEDIAL: 8.59
LV e' LATERAL: 9.14 cm/s
LVEEMED: 12.69
LVOT SV: 68 mL
LVOT VTI: 26.8 cm
LVOT area: 2.54 cm2
LVOT peak grad rest: 6 mmHg
LVOTD: 18 mm
LVOTPV: 125 cm/s
MV pk E vel: 116 m/s
MVPG: 5 mmHg
MVPKAVEL: 127 m/s
RV LATERAL S' VELOCITY: 13.7 cm/s
RV TAPSE: 22.2 mm

## 2017-01-07 LAB — CBC
HEMATOCRIT: 42.6 % (ref 36.0–46.0)
Hemoglobin: 13.8 g/dL (ref 12.0–15.0)
MCH: 32.2 pg (ref 26.0–34.0)
MCHC: 32.4 g/dL (ref 30.0–36.0)
MCV: 99.5 fL (ref 78.0–100.0)
PLATELETS: 221 10*3/uL (ref 150–400)
RBC: 4.28 MIL/uL (ref 3.87–5.11)
RDW: 15.4 % (ref 11.5–15.5)
WBC: 6.4 10*3/uL (ref 4.0–10.5)

## 2017-01-07 LAB — COMPREHENSIVE METABOLIC PANEL
ALBUMIN: 3.9 g/dL (ref 3.5–5.0)
ALK PHOS: 69 U/L (ref 38–126)
ALT: 16 U/L (ref 14–54)
AST: 22 U/L (ref 15–41)
Anion gap: 7 (ref 5–15)
BILIRUBIN TOTAL: 0.5 mg/dL (ref 0.3–1.2)
BUN: 13 mg/dL (ref 6–20)
CALCIUM: 9 mg/dL (ref 8.9–10.3)
CO2: 31 mmol/L (ref 22–32)
CREATININE: 0.82 mg/dL (ref 0.44–1.00)
Chloride: 101 mmol/L (ref 101–111)
GFR calc Af Amer: >60 mL/min (ref >60)
GFR calc non Af Amer: >60 mL/min (ref >60)
GLUCOSE: 94 mg/dL (ref 65–99)
Potassium: 4.2 mmol/L (ref 3.5–5.1)
SODIUM: 139 mmol/L (ref 135–145)
TOTAL PROTEIN: 7.1 g/dL (ref 6.5–8.1)

## 2017-01-07 NOTE — Progress Notes (Signed)
Patient ID: KAMALEI ROEDER, female   DOB: 1946/05/11, 71 y.o.   MRN: [id number]  [facility] NOTE  Patient ID: OVA GILLENTINE, female   DOB: [birth year], 71 y.o.   MRN: [id number] PCP: Primary Cardiologist: Dr Haroldine Laws  Pulmonary: Dr Melvyn Novas   HPI: 71 y/o woman with a h/o HTN, COPD (former smoker quit 2015), seasonal allergies, asthma, pulmonary HTN, and diastolic dysfxn.  Admitted 1/23 secondary to progressive DOE, hypoxia, and lower ext edema and has been found to have severe PAH. R/LHC 10/08/15 with normal coronaries, normal CO, and severe PAH PAP 104/49 with 13.6 WU. PAH well out of proportion to left-sided filling pressures and COPD. Started revatio 20 TID 10/08/15 with consideration for macitentan as outpatient. PFTs 10/05/15  FEV1 0.91 (40%)FVC 1.64 (55%), DLCO 8.80 (38%). Auto-immune and infectious serologies negative to date. Discharge weight 148 pounds.   Returns for routine f/u: Remains on Adcirca 40 and Letairis 10. Atrovent helping post-nasal drip. Gets around "pretty good." Gets SOB if she has to carry something.  Gets on TM 20 mins about every other day. Walks at 2 mph with mild incline. No CP, syncope, edema. Use oxygen.   Echo today EF 60-65% RV completely normal. No TR to measure RVSP. No effusion   PAH regimen: 1. Adcirca 40 2. Letairis 10 (Switched to [name] as it was thought that sinusitis might be due to macitentan.)  6MW 8/17; 840 ft (256 M) on 2 L O2, sats ranged 83-95%, HR ranged 96-111.  Echo 3/17 EF 60-65% septum still flattened. RV size improved moderate HK. RVSP ~80. IVC small. Pericardial effusion essentially resolved    VQ scan low prob. CT chest mild COPD. Moderate pericardial effusion. 3v CAD. + cirrhosis. Hepatitis serologies negative.   PFTs  (FEV1 1.23 (52%) with FVC 2.01 (65%) and DLCO 51% in 7/15) PFT's 10/08/2015 FEV1 0.91 (40 %) ratio 56%DLCO 38 % corrects to 63 % for alv volume  PFTs  12/06/15 FEV1 1.0 (44%) FVC 1.74 (58%0 DLCO  28%  R/LHC 10/08/15 with normal coronaries, normal CO, and sever PAH with 13.6 WU.  Findings: Ao = 108/68 (86) LV = 102/10/11 RA = 9 RV = 97/8/12 PA = 104/49 (72) PCW = 18 Fick cardiac output/index = 3.96/2.34 PVR = 13.6 WU FA sat = 93% PA sat = 66%, 69%  ROS: All systems negative except as listed in HPI, PMH and Problem List.  SH:  Social History   Social History  . Marital status: Married    Spouse name: N/A  . Number of children: N/A  . Years of education: N/A   Occupational History  . Not on file.   Social History Main Topics  . Smoking status: Former Smoker    Packs/day: 1.00    Years: 40.00    Types: Cigarettes    Quit date: 02/22/2014  . Smokeless tobacco: Never Used  . Alcohol use No  . Drug use: No  . Sexual activity: No   Other Topics Concern  . Not on file   Social History Narrative   Married with 2 children.  Independent of ADLs.      Does not have a living will.   Would desire CPR but would not want prolonged life support if futile- husband aware.    FH:  Family History  Problem Relation Age of Onset  . Glaucoma Brother   . Vascular Disease Father     Died of complications from surgery  . Asthma Mother  Died of asthma attack  . Dementia Mother     Past Medical History:  Diagnosis Date  . Acute respiratory failure with hypoxia and hypercapnea 02/26/2014  . Allergic rhinitis due to pollen   . COPD (chronic obstructive pulmonary disease) ([place])   . Diastolic dysfunction    a. 02/2014 Echo: EF 65-70%, Gr 1 DD, RVH;  b. 09/2015 Echo: EF 55%, Gr 1 DD.  . H/O seasonal allergies   . History of tobacco abuse    a. Quit 2015.  [name] Hypertensive heart disease   . Lower extremity edema   . Pericardial effusion    a. 09/2015 Echo: Mod eff w/o tamponade.  . Right heart failure with reduced right ventricular function ([place])    a. 09/2015 Echo: EF 55%, Gr 1 DD, D shaped IV septum, sev dil RV with mod reduced fxn, mod dil RA, RV-RA grad 35mHg, PASP  838mg, mod pericard eff w/o tamponade.  . Severe Pulmonary Hypertension    a. 09/2015 Echo: PASP 8785m.    Current Outpatient Prescriptions  Medication Sig Dispense Refill  . ADVAIR HFA 115-21 MCG/ACT inhaler INHALE 2 PUFFS INTO THE LUNGS TWICE DAILY 12 Inhaler 2  . ambrisentan (LETAIRIS) 10 MG tablet Take 1 tablet (10 mg total) by mouth daily. 30 tablet 11  . aspirin EC 81 MG EC tablet Take 1 tablet (81 mg total) by mouth daily. 30 tablet 0  . clotrimazole (MYCELEX) 10 MG troche Take 1 tablet (10 mg total) by mouth 5 (five) times daily. 40 tablet 1  . fluticasone (FLONASE) 50 MCG/ACT nasal spray SHAKE LIQUID WELL AND USE 2 SPRAYS IN EACH NOSTRIL DAILY 16 g 6  . furosemide (LASIX) 20 MG tablet TAKE 1 TABLET BY MOUTH EVERY DAY 30 tablet 3  . ipratropium (ATROVENT) 0.06 % nasal spray Place 2 sprays into both nostrils 2 (two) times daily. 15 mL 12  . loratadine (CLARITIN) 10 MG tablet Take 10 mg by mouth daily.    . OXYGEN 2 to 2lpm 24/7    . potassium chloride (K-DUR) 10 MEQ tablet Take 1 tablet (10 mEq total) by mouth daily. 90 tablet 3  . tadalafil, PAH, (ADCIRCA) 20 MG tablet Take 2 tablets (40 mg total) by mouth daily. 60 tablet 11  . umeclidinium bromide (INCRUSE ELLIPTA) 62.5 MCG/INH AEPB Inhale 1 puff into the lungs every morning. 30 each 11   No current facility-administered medications for this encounter.     Vitals:   01/07/17 1401  BP: 140/72  Pulse: (!) 107  SpO2: 92%  Weight: 144 lb (65.3 kg)    PHYSICAL EXAM:  General:  Well appearing. No resp difficulty HEENT: normal Neck: supple. no JVD. Carotids 2+ bilat; no bruits. No lymphadenopathy or thryomegaly appreciated. Cor: PMI nondisplaced. Regular rate & rhythm. Increased P2 no murmure Lungs: clear but decreased BS throughout. No wheeze Abdomen: soft, nontender, nondistended. No hepatosplenomegaly. No bruits or masses. Good bowel sounds. Extremities: no cyanosis, clubbing, rash, edema Neuro: alert & orientedx3,  cranial nerves grossly intact. moves all 4 extremities w/o difficulty. Affect pleasant    ASSESSMENT & PLAN:  1. PAH with right heart failure --suspect combination of WHO Group I and III PAH --symptomatically much improved with diuresis, O2 and Adcirca and letaris. Echo today with complete resolution of RV strain (amazing given how bad RV was previously). No significant TR to estimate RVSP --Will get 6MW today (she did 241m60muspect mostly limited by COPD but will repeat RHC to reassess PAH [place]  I thought she would do more)  --Given how high PA pressures were previously, will repeat RHC to establish new baseline and to evaluate poor performance on 6MW 2. Pericardial effusion --resolved with treatment of HF and PAH 3. Chronic Respiratory failure - On 2 liters oxygen - Follows with Dr. Melvyn Novas. repeat PFTs pending 4. COPD- Gold III  - on inhalers. Follows with Pulmonary. As above 5. Polycythemia suspect due to chronic hypoxia  --now resolved.   6. Cirrhosis on CT --due to RHF. Hepatitis serologies are negative 7. Sinus drainage --continue Flonase --Much improved with Atrovent  Bensimhon, Daniel,MD 2:19 PM

## 2017-01-07 NOTE — Progress Notes (Signed)
6 min walk test completed.  Pt ambulated 790 ft (240.8 m).  On 4L O2 sats ranged 94-89%, HR 93-114.

## 2017-01-07 NOTE — Progress Notes (Signed)
  Echocardiogram 2D Echocardiogram has been performed.  Linda Stevens 01/07/2017, 1:48 PM

## 2017-01-07 NOTE — Patient Instructions (Signed)
Labs today  Right Heart Catheterization Mon 5/21, see instruction sheet  We will contact you in 6 months to schedule your next appointment.

## 2017-01-08 ENCOUNTER — Other Ambulatory Visit (HOSPITAL_COMMUNITY): Payer: Self-pay | Admitting: *Deleted

## 2017-01-08 DIAGNOSIS — I272 Pulmonary hypertension, unspecified: Secondary | ICD-10-CM

## 2017-01-14 ENCOUNTER — Encounter (HOSPITAL_COMMUNITY): Payer: Self-pay

## 2017-01-14 NOTE — Progress Notes (Signed)
Medical record request received from [facility] [place] for medical records from our office on behalf of patient. All records requested faxed to [phone] (25 pages total). Copy of request scanned into patient's electronic medical record for reference under media tab.  Renee Pain, RN

## 2017-01-15 ENCOUNTER — Telehealth (HOSPITAL_COMMUNITY): Payer: Self-pay | Admitting: *Deleted

## 2017-01-15 NOTE — Telephone Encounter (Signed)
Josem Kaufmann PY#[id number] expires 04/14/2017

## 2017-01-26 ENCOUNTER — Ambulatory Visit (HOSPITAL_COMMUNITY)
Admission: RE | Admit: 2017-01-26 | Discharge: 2017-01-26 | Disposition: A | Discharge status: Home / Self Care | Payer: Medicare HMO | Source: Ambulatory Visit | Attending: Internal Medicine | Admitting: Internal Medicine

## 2017-01-26 ENCOUNTER — Encounter (HOSPITAL_COMMUNITY): Payer: Self-pay | Admitting: *Deleted

## 2017-01-26 ENCOUNTER — Encounter (HOSPITAL_COMMUNITY)
Admission: RE | Disposition: A | Discharge status: Home / Self Care | Payer: Self-pay | Source: Ambulatory Visit | Attending: Internal Medicine

## 2017-01-26 DIAGNOSIS — I50814 Right heart failure due to left heart failure: Secondary | ICD-10-CM | POA: Insufficient documentation

## 2017-01-26 DIAGNOSIS — Z7951 Long term (current) use of inhaled steroids: Secondary | ICD-10-CM | POA: Insufficient documentation

## 2017-01-26 DIAGNOSIS — I2721 Secondary pulmonary arterial hypertension: Secondary | ICD-10-CM

## 2017-01-26 DIAGNOSIS — K7460 Unspecified cirrhosis of liver: Secondary | ICD-10-CM | POA: Insufficient documentation

## 2017-01-26 DIAGNOSIS — D751 Secondary polycythemia: Secondary | ICD-10-CM | POA: Diagnosis not present

## 2017-01-26 DIAGNOSIS — I110 Hypertensive heart disease with heart failure: Secondary | ICD-10-CM | POA: Diagnosis not present

## 2017-01-26 DIAGNOSIS — Z7982 Long term (current) use of aspirin: Secondary | ICD-10-CM | POA: Diagnosis not present

## 2017-01-26 DIAGNOSIS — I2510 Atherosclerotic heart disease of native coronary artery without angina pectoris: Secondary | ICD-10-CM | POA: Diagnosis not present

## 2017-01-26 DIAGNOSIS — J9610 Chronic respiratory failure, unspecified whether with hypoxia or hypercapnia: Secondary | ICD-10-CM | POA: Diagnosis not present

## 2017-01-26 DIAGNOSIS — I272 Pulmonary hypertension, unspecified: Secondary | ICD-10-CM

## 2017-01-26 DIAGNOSIS — Z87891 Personal history of nicotine dependence: Secondary | ICD-10-CM | POA: Diagnosis not present

## 2017-01-26 DIAGNOSIS — J449 Chronic obstructive pulmonary disease, unspecified: Secondary | ICD-10-CM | POA: Diagnosis not present

## 2017-01-26 DIAGNOSIS — I313 Pericardial effusion (noninflammatory): Secondary | ICD-10-CM | POA: Insufficient documentation

## 2017-01-26 DIAGNOSIS — I5030 Unspecified diastolic (congestive) heart failure: Secondary | ICD-10-CM | POA: Diagnosis not present

## 2017-01-26 HISTORY — PX: RIGHT HEART CATH: CATH118263

## 2017-01-26 LAB — CBC
HEMATOCRIT: 41.6 % (ref 36.0–46.0)
Hemoglobin: 13.2 g/dL (ref 12.0–15.0)
MCH: 32 pg (ref 26.0–34.0)
MCHC: 31.7 g/dL (ref 30.0–36.0)
MCV: 101 fL — AB (ref 78.0–100.0)
PLATELETS: 223 10*3/uL (ref 150–400)
RBC: 4.12 MIL/uL (ref 3.87–5.11)
RDW: 15.6 % — AB (ref 11.5–15.5)
WBC: 5 10*3/uL (ref 4.0–10.5)

## 2017-01-26 LAB — PROTIME-INR
INR: 0.97
Prothrombin Time: 12.9 seconds (ref 11.4–15.2)

## 2017-01-26 LAB — POCT I-STAT 3, VENOUS BLOOD GAS (G3P V)
ACID-BASE EXCESS: 6 mmol/L — AB (ref 0.0–2.0)
Acid-Base Excess: 6 mmol/L — ABNORMAL HIGH (ref 0.0–2.0)
Bicarbonate: 31.7 mmol/L — ABNORMAL HIGH (ref 20.0–28.0)
Bicarbonate: 32.7 mmol/L — ABNORMAL HIGH (ref 20.0–28.0)
O2 Saturation: 70 %
O2 Saturation: 72 %
PCO2 VEN: 56.6 mmHg (ref 44.0–60.0)
PH VEN: 7.37 (ref 7.250–7.430)
PH VEN: 7.392 (ref 7.250–7.430)
PO2 VEN: 40 mmHg (ref 32.0–45.0)
TCO2: 33 mmol/L (ref 0–100)
TCO2: 34 mmol/L (ref 0–100)
pCO2, Ven: 52.2 mmHg (ref 44.0–60.0)
pO2, Ven: 38 mmHg (ref 32.0–45.0)

## 2017-01-26 LAB — BASIC METABOLIC PANEL
Anion gap: 7 (ref 5–15)
BUN: 13 mg/dL (ref 6–20)
CHLORIDE: 102 mmol/L (ref 101–111)
CO2: 32 mmol/L (ref 22–32)
Calcium: 8.7 mg/dL — ABNORMAL LOW (ref 8.9–10.3)
Creatinine, Ser: 0.8 mg/dL (ref 0.44–1.00)
Glucose, Bld: 100 mg/dL — ABNORMAL HIGH (ref 65–99)
POTASSIUM: 4.3 mmol/L (ref 3.5–5.1)
SODIUM: 141 mmol/L (ref 135–145)

## 2017-01-26 SURGERY — RIGHT HEART CATH
Anesthesia: LOCAL

## 2017-01-26 MED ORDER — SODIUM CHLORIDE 0.9 % IV SOLN: INTRAVENOUS | Status: DC

## 2017-01-26 MED ORDER — SODIUM CHLORIDE 0.9% FLUSH
3.0000 mL | INTRAVENOUS | Status: DC | PRN
Start: 2017-01-26 — End: 2017-01-26

## 2017-01-26 MED ORDER — LIDOCAINE HCL 1 % IJ SOLN
INTRAMUSCULAR | Status: AC
  Filled 2017-01-26: qty 20

## 2017-01-26 MED ORDER — MIDAZOLAM HCL 2 MG/2ML IJ SOLN
INTRAMUSCULAR | Status: AC
  Filled 2017-01-26: qty 2

## 2017-01-26 MED ORDER — FENTANYL CITRATE (PF) 100 MCG/2ML IJ SOLN
INTRAMUSCULAR | Status: AC
  Filled 2017-01-26: qty 2

## 2017-01-26 MED ORDER — HEPARIN (PORCINE) IN NACL 2-0.9 UNIT/ML-% IJ SOLN
INTRAMUSCULAR | Status: AC
  Filled 2017-01-26: qty 500

## 2017-01-26 MED ORDER — SODIUM CHLORIDE 0.9 % IV SOLN
INTRAVENOUS | Status: AC | PRN
  Administered 2017-01-26: 10 mL/h via INTRAVENOUS

## 2017-01-26 MED ORDER — FENTANYL CITRATE (PF) 100 MCG/2ML IJ SOLN
INTRAMUSCULAR | Status: DC | PRN
  Administered 2017-01-26: 25 ug via INTRAVENOUS

## 2017-01-26 MED ORDER — SODIUM CHLORIDE 0.9% FLUSH: 3.0000 mL | Freq: Two times a day (BID) | INTRAVENOUS | Status: DC

## 2017-01-26 MED ORDER — ACETAMINOPHEN 325 MG PO TABS: 650.0000 mg | ORAL_TABLET | ORAL | Status: DC | PRN

## 2017-01-26 MED ORDER — HEPARIN (PORCINE) IN NACL 2-0.9 UNIT/ML-% IJ SOLN
INTRAMUSCULAR | Status: AC | PRN
  Administered 2017-01-26: 500 mL

## 2017-01-26 MED ORDER — ONDANSETRON HCL 4 MG/2ML IJ SOLN: 4.0000 mg | Freq: Four times a day (QID) | INTRAMUSCULAR | Status: DC | PRN

## 2017-01-26 MED ORDER — LIDOCAINE HCL (PF) 1 % IJ SOLN
INTRAMUSCULAR | Status: DC | PRN
  Administered 2017-01-26: 2 mL via SUBCUTANEOUS

## 2017-01-26 MED ORDER — SODIUM CHLORIDE 0.9 % IV SOLN: 250.0000 mL | INTRAVENOUS | Status: DC | PRN

## 2017-01-26 MED ORDER — MIDAZOLAM HCL 2 MG/2ML IJ SOLN
INTRAMUSCULAR | Status: DC | PRN
  Administered 2017-01-26: 1 mg via INTRAVENOUS

## 2017-01-26 SURGICAL SUPPLY — 6 items
CATH BALLN WEDGE 5F 110CM (CATHETERS) ×1 IMPLANT
PACK CARDIAC CATHETERIZATION (CUSTOM PROCEDURE TRAY) ×2 IMPLANT
PROTECTION STATION PRESSURIZED (MISCELLANEOUS) ×2
SHEATH FAST CATH BRACH 5F 5CM (SHEATH) ×1 IMPLANT
STATION PROTECTION PRESSURIZED (MISCELLANEOUS) IMPLANT
TRANSDUCER W/STOPCOCK (MISCELLANEOUS) ×2 IMPLANT

## 2017-01-26 NOTE — Discharge Instructions (Signed)
Call Dr Bensimhon's office if any problems,questions, or concerns and call if any redness,drainage,fever,pain, swelling or bleeding right arm procedure site

## 2017-01-26 NOTE — Interval H&P Note (Signed)
History and Physical Interval Note:  01/26/2017 9:40 AM  Linda Stevens  has presented today for surgery, with the diagnosis of PAH  The various methods of treatment have been discussed with the patient and family. After consideration of risks, benefits and other options for treatment, the patient has consented to  Procedure(s): Right Heart Cath (N/A) as a surgical intervention .  The patient's history has been reviewed, patient examined, no change in status, stable for surgery.  I have reviewed the patient's chart and labs.  Questions were answered to the patient's satisfaction.     Bensimhon, Quillian Quince

## 2017-01-26 NOTE — H&P (View-Only) (Signed)
Patient ID: Linda Stevens, female   DOB: 12/17/1945, 70 y.o.   MRN: [id number]  [facility] NOTE  Patient ID: Linda Stevens, female   DOB: 03/14/1946, 70 y.o.   MRN: [id number] PCP: Primary Cardiologist: Dr Bensimhon  Pulmonary: Dr Wert   HPI: 70 y/o woman with a h/o HTN, COPD (former smoker quit 2015), seasonal allergies, asthma, pulmonary HTN, and diastolic dysfxn.  Admitted 1/23 secondary to progressive DOE, hypoxia, and lower ext edema and has been found to have severe PAH. R/LHC 10/08/15 with normal coronaries, normal CO, and severe PAH PAP 104/49 with 13.6 WU. PAH well out of proportion to left-sided filling pressures and COPD. Started revatio 20 TID 10/08/15 with consideration for macitentan as outpatient. PFTs 10/05/15  FEV1 0.91 (40%)FVC 1.64 (55%), DLCO 8.80 (38%). Auto-immune and infectious serologies negative to date. Discharge weight 148 pounds.   Returns for routine f/u: Remains on Adcirca 40 and Letairis 10. Atrovent helping post-nasal drip. Gets around "pretty good." Gets SOB if she has to carry something.  Gets on TM 20 mins about every other day. Walks at 2 mph with mild incline. No CP, syncope, edema. Use oxygen.   Echo today EF 60-65% RV completely normal. No TR to measure RVSP. No effusion   PAH regimen: 1. Adcirca 40 2. Letairis 10 (Switched to [place] as it was thought that sinusitis might be due to macitentan.)  6MW 8/17; 840 ft (256 M) on 2 L O2, sats ranged 83-95%, HR ranged 96-111.  Echo 3/17 EF 60-65% septum still flattened. RV size improved moderate HK. RVSP ~80. IVC small. Pericardial effusion essentially resolved    VQ scan low prob. CT chest mild COPD. Moderate pericardial effusion. 3v CAD. + cirrhosis. Hepatitis serologies negative.   PFTs  (FEV1 1.23 (52%) with FVC 2.01 (65%) and DLCO 51% in 7/15) PFT's 10/08/2015 FEV1 0.91 (40 %) ratio 56%DLCO 38 % corrects to 63 % for alv volume  PFTs  12/06/15 FEV1 1.0 (44%) FVC 1.74 (58%0 DLCO  28%  R/LHC 10/08/15 with normal coronaries, normal CO, and sever PAH with 13.6 WU.  Findings: Ao = 108/68 (86) LV = 102/10/11 RA = 9 RV = 97/8/12 PA = 104/49 (72) PCW = 18 Fick cardiac output/index = 3.96/2.34 PVR = 13.6 WU FA sat = 93% PA sat = 66%, 69%  ROS: All systems negative except as listed in HPI, PMH and Problem List.  SH:  Social History   Social History  . Marital status: Married    Spouse name: N/A  . Number of children: N/A  . Years of education: N/A   Occupational History  . Not on file.   Social History Main Topics  . Smoking status: Former Smoker    Packs/day: 1.00    Years: 40.00    Types: Cigarettes    Quit date: 02/22/2014  . Smokeless tobacco: Never Used  . Alcohol use No  . Drug use: No  . Sexual activity: No   Other Topics Concern  . Not on file   Social History Narrative   Married with 2 children.  Independent of ADLs.      Does not have a living will.   Would desire CPR but would not want prolonged life support if futile- husband aware.    FH:  Family History  Problem Relation Age of Onset  . Glaucoma Brother   . Vascular Disease Father     Died of complications from surgery  . Asthma Mother       Died of asthma attack  . Dementia Mother     Past Medical History:  Diagnosis Date  . Acute respiratory failure with hypoxia and hypercapnea 02/26/2014  . Allergic rhinitis due to pollen   . COPD (chronic obstructive pulmonary disease) ([facility])   . Diastolic dysfunction    a. 02/2014 Echo: EF 65-70%, Gr 1 DD, RVH;  b. 09/2015 Echo: EF 55%, Gr 1 DD.  . H/O seasonal allergies   . History of tobacco abuse    a. Quit 2015.  . Hypertensive heart disease   . Lower extremity edema   . Pericardial effusion    a. 09/2015 Echo: Mod eff w/o tamponade.  . Right heart failure with reduced right ventricular function ([facility])    a. 09/2015 Echo: EF 55%, Gr 1 DD, D shaped IV septum, sev dil RV with mod reduced fxn, mod dil RA, RV-RA grad 72mmHg, PASP  87mmHg, mod pericard eff w/o tamponade.  . Severe Pulmonary Hypertension    a. 09/2015 Echo: PASP 87mmHg.    Current Outpatient Prescriptions  Medication Sig Dispense Refill  . ADVAIR HFA 115-21 MCG/ACT inhaler INHALE 2 PUFFS INTO THE LUNGS TWICE DAILY 12 Inhaler 2  . ambrisentan (LETAIRIS) 10 MG tablet Take 1 tablet (10 mg total) by mouth daily. 30 tablet 11  . aspirin EC 81 MG EC tablet Take 1 tablet (81 mg total) by mouth daily. 30 tablet 0  . clotrimazole (MYCELEX) 10 MG troche Take 1 tablet (10 mg total) by mouth 5 (five) times daily. 40 tablet 1  . fluticasone (FLONASE) 50 MCG/ACT nasal spray SHAKE LIQUID WELL AND USE 2 SPRAYS IN EACH NOSTRIL DAILY 16 g 6  . furosemide (LASIX) 20 MG tablet TAKE 1 TABLET BY MOUTH EVERY DAY 30 tablet 3  . ipratropium (ATROVENT) 0.06 % nasal spray Place 2 sprays into both nostrils 2 (two) times daily. 15 mL 12  . loratadine (CLARITIN) 10 MG tablet Take 10 mg by mouth daily.    . OXYGEN 2 to 2lpm 24/7    . potassium chloride (K-DUR) 10 MEQ tablet Take 1 tablet (10 mEq total) by mouth daily. 90 tablet 3  . tadalafil, PAH, (ADCIRCA) 20 MG tablet Take 2 tablets (40 mg total) by mouth daily. 60 tablet 11  . umeclidinium bromide (INCRUSE ELLIPTA) 62.5 MCG/INH AEPB Inhale 1 puff into the lungs every morning. 30 each 11   No current facility-administered medications for this encounter.     Vitals:   01/07/17 1401  BP: 140/72  Pulse: (!) 107  SpO2: 92%  Weight: 144 lb (65.3 kg)    PHYSICAL EXAM:  General:  Well appearing. No resp difficulty HEENT: normal Neck: supple. no JVD. Carotids 2+ bilat; no bruits. No lymphadenopathy or thryomegaly appreciated. Cor: PMI nondisplaced. Regular rate & rhythm. Increased P2 no murmure Lungs: clear but decreased BS throughout. No wheeze Abdomen: soft, nontender, nondistended. No hepatosplenomegaly. No bruits or masses. Good bowel sounds. Extremities: no cyanosis, clubbing, rash, edema Neuro: alert & orientedx3,  cranial nerves grossly intact. moves all 4 extremities w/o difficulty. Affect pleasant    ASSESSMENT & PLAN:  1. PAH with right heart failure --suspect combination of WHO Group I and III PAH --symptomatically much improved with diuresis, O2 and Adcirca and letaris. Echo today with complete resolution of RV strain (amazing given how bad RV was previously). No significant TR to estimate RVSP --Will get 6MW today (she did 241m - suspect mostly limited by COPD but will repeat RHC to reassess PAH as   I thought she would do more)  --Given how high PA pressures were previously, will repeat RHC to establish new baseline and to evaluate poor performance on 6MW 2. Pericardial effusion --resolved with treatment of HF and PAH 3. Chronic Respiratory failure - On 2 liters oxygen - Follows with Dr. Melvyn Novas. repeat PFTs pending 4. COPD- Gold III  - on inhalers. Follows with Pulmonary. As above 5. Polycythemia suspect due to chronic hypoxia  --now resolved.   6. Cirrhosis on CT --due to RHF. Hepatitis serologies are negative 7. Sinus drainage --continue Flonase --Much improved with Atrovent  Bensimhon, Daniel,MD 2:19 PM

## 2017-01-27 LAB — POCT I-STAT 3, VENOUS BLOOD GAS (G3P V)
ACID-BASE EXCESS: 7 mmol/L — AB (ref 0.0–2.0)
BICARBONATE: 33.2 mmol/L — AB (ref 20.0–28.0)
O2 Saturation: 68 %
TCO2: 35 mmol/L (ref 0–100)
pCO2, Ven: 53.8 mmHg (ref 44.0–60.0)
pH, Ven: 7.398 (ref 7.250–7.430)
pO2, Ven: 37 mmHg (ref 32.0–45.0)

## 2017-02-05 ENCOUNTER — Other Ambulatory Visit (HOSPITAL_COMMUNITY): Payer: Self-pay | Admitting: Cardiology

## 2017-02-05 DIAGNOSIS — R0602 Shortness of breath: Secondary | ICD-10-CM | POA: Diagnosis not present

## 2017-02-05 DIAGNOSIS — J449 Chronic obstructive pulmonary disease, unspecified: Secondary | ICD-10-CM | POA: Diagnosis not present

## 2017-02-05 DIAGNOSIS — I10 Essential (primary) hypertension: Secondary | ICD-10-CM | POA: Diagnosis not present

## 2017-02-05 DIAGNOSIS — J188 Other pneumonia, unspecified organism: Secondary | ICD-10-CM | POA: Diagnosis not present

## 2017-02-05 DIAGNOSIS — R05 Cough: Secondary | ICD-10-CM | POA: Diagnosis not present

## 2017-02-05 DIAGNOSIS — R062 Wheezing: Secondary | ICD-10-CM | POA: Diagnosis not present

## 2017-02-05 DIAGNOSIS — J45909 Unspecified asthma, uncomplicated: Secondary | ICD-10-CM | POA: Diagnosis not present

## 2017-02-05 DIAGNOSIS — J45901 Unspecified asthma with (acute) exacerbation: Secondary | ICD-10-CM | POA: Diagnosis not present

## 2017-02-05 DIAGNOSIS — J9601 Acute respiratory failure with hypoxia: Secondary | ICD-10-CM | POA: Diagnosis not present

## 2017-02-05 MED ORDER — TADALAFIL (PAH) 20 MG PO TABS: 40.0000 mg | ORAL_TABLET | Freq: Every day | ORAL | 0 refills | Status: DC

## 2017-03-07 DIAGNOSIS — R062 Wheezing: Secondary | ICD-10-CM | POA: Diagnosis not present

## 2017-03-07 DIAGNOSIS — J449 Chronic obstructive pulmonary disease, unspecified: Secondary | ICD-10-CM | POA: Diagnosis not present

## 2017-03-07 DIAGNOSIS — J45909 Unspecified asthma, uncomplicated: Secondary | ICD-10-CM | POA: Diagnosis not present

## 2017-03-07 DIAGNOSIS — R05 Cough: Secondary | ICD-10-CM | POA: Diagnosis not present

## 2017-03-07 DIAGNOSIS — I10 Essential (primary) hypertension: Secondary | ICD-10-CM | POA: Diagnosis not present

## 2017-03-07 DIAGNOSIS — J45901 Unspecified asthma with (acute) exacerbation: Secondary | ICD-10-CM | POA: Diagnosis not present

## 2017-03-07 DIAGNOSIS — J188 Other pneumonia, unspecified organism: Secondary | ICD-10-CM | POA: Diagnosis not present

## 2017-03-07 DIAGNOSIS — J9601 Acute respiratory failure with hypoxia: Secondary | ICD-10-CM | POA: Diagnosis not present

## 2017-03-07 DIAGNOSIS — R0602 Shortness of breath: Secondary | ICD-10-CM | POA: Diagnosis not present

## 2017-03-19 ENCOUNTER — Other Ambulatory Visit: Payer: Self-pay | Admitting: Internal Medicine

## 2017-03-23 DIAGNOSIS — H524 Presbyopia: Secondary | ICD-10-CM | POA: Diagnosis not present

## 2017-03-23 DIAGNOSIS — H2513 Age-related nuclear cataract, bilateral: Secondary | ICD-10-CM | POA: Diagnosis not present

## 2017-03-23 DIAGNOSIS — H04123 Dry eye syndrome of bilateral lacrimal glands: Secondary | ICD-10-CM | POA: Diagnosis not present

## 2017-03-28 DIAGNOSIS — Z460 Encounter for fitting and adjustment of spectacles and contact lenses: Secondary | ICD-10-CM | POA: Diagnosis not present

## 2017-04-07 DIAGNOSIS — R0602 Shortness of breath: Secondary | ICD-10-CM | POA: Diagnosis not present

## 2017-04-07 DIAGNOSIS — J45909 Unspecified asthma, uncomplicated: Secondary | ICD-10-CM | POA: Diagnosis not present

## 2017-04-07 DIAGNOSIS — J45901 Unspecified asthma with (acute) exacerbation: Secondary | ICD-10-CM | POA: Diagnosis not present

## 2017-04-07 DIAGNOSIS — J449 Chronic obstructive pulmonary disease, unspecified: Secondary | ICD-10-CM | POA: Diagnosis not present

## 2017-04-07 DIAGNOSIS — J188 Other pneumonia, unspecified organism: Secondary | ICD-10-CM | POA: Diagnosis not present

## 2017-04-07 DIAGNOSIS — J9601 Acute respiratory failure with hypoxia: Secondary | ICD-10-CM | POA: Diagnosis not present

## 2017-04-07 DIAGNOSIS — R062 Wheezing: Secondary | ICD-10-CM | POA: Diagnosis not present

## 2017-04-07 DIAGNOSIS — R05 Cough: Secondary | ICD-10-CM | POA: Diagnosis not present

## 2017-04-07 DIAGNOSIS — I10 Essential (primary) hypertension: Secondary | ICD-10-CM | POA: Diagnosis not present

## 2017-04-18 ENCOUNTER — Other Ambulatory Visit: Payer: Self-pay | Admitting: Internal Medicine

## 2017-05-01 ENCOUNTER — Other Ambulatory Visit (HOSPITAL_COMMUNITY): Payer: Self-pay | Admitting: Internal Medicine

## 2017-05-08 DIAGNOSIS — J45909 Unspecified asthma, uncomplicated: Secondary | ICD-10-CM | POA: Diagnosis not present

## 2017-05-08 DIAGNOSIS — I10 Essential (primary) hypertension: Secondary | ICD-10-CM | POA: Diagnosis not present

## 2017-05-08 DIAGNOSIS — J188 Other pneumonia, unspecified organism: Secondary | ICD-10-CM | POA: Diagnosis not present

## 2017-05-08 DIAGNOSIS — R062 Wheezing: Secondary | ICD-10-CM | POA: Diagnosis not present

## 2017-05-08 DIAGNOSIS — R05 Cough: Secondary | ICD-10-CM | POA: Diagnosis not present

## 2017-05-08 DIAGNOSIS — J449 Chronic obstructive pulmonary disease, unspecified: Secondary | ICD-10-CM | POA: Diagnosis not present

## 2017-05-08 DIAGNOSIS — J45901 Unspecified asthma with (acute) exacerbation: Secondary | ICD-10-CM | POA: Diagnosis not present

## 2017-05-08 DIAGNOSIS — J9601 Acute respiratory failure with hypoxia: Secondary | ICD-10-CM | POA: Diagnosis not present

## 2017-05-08 DIAGNOSIS — R0602 Shortness of breath: Secondary | ICD-10-CM | POA: Diagnosis not present

## 2017-06-02 ENCOUNTER — Telehealth: Payer: Self-pay | Admitting: Family Medicine

## 2017-06-02 NOTE — Telephone Encounter (Signed)
PT called to request transfer from Dr Deborra Medina to Dr Diona Browner. She said [facility] [facility] is too far for her to drive and she has multiple health concerns and would prefer to not establish with a NP. Your next new pt appt is over a year out, can she establish her care earlier with you?

## 2017-06-04 NOTE — Telephone Encounter (Signed)
I am a year out because I am full! Can anyone else in office accept?

## 2017-06-05 ENCOUNTER — Ambulatory Visit (INDEPENDENT_AMBULATORY_CARE_PROVIDER_SITE_OTHER): Payer: Medicare HMO

## 2017-06-05 DIAGNOSIS — Z23 Encounter for immunization: Secondary | ICD-10-CM

## 2017-06-07 DIAGNOSIS — R062 Wheezing: Secondary | ICD-10-CM | POA: Diagnosis not present

## 2017-06-07 DIAGNOSIS — J449 Chronic obstructive pulmonary disease, unspecified: Secondary | ICD-10-CM | POA: Diagnosis not present

## 2017-06-07 DIAGNOSIS — I10 Essential (primary) hypertension: Secondary | ICD-10-CM | POA: Diagnosis not present

## 2017-06-07 DIAGNOSIS — R0602 Shortness of breath: Secondary | ICD-10-CM | POA: Diagnosis not present

## 2017-06-07 DIAGNOSIS — J45901 Unspecified asthma with (acute) exacerbation: Secondary | ICD-10-CM | POA: Diagnosis not present

## 2017-06-07 DIAGNOSIS — J188 Other pneumonia, unspecified organism: Secondary | ICD-10-CM | POA: Diagnosis not present

## 2017-06-07 DIAGNOSIS — J45909 Unspecified asthma, uncomplicated: Secondary | ICD-10-CM | POA: Diagnosis not present

## 2017-06-07 DIAGNOSIS — J9601 Acute respiratory failure with hypoxia: Secondary | ICD-10-CM | POA: Diagnosis not present

## 2017-06-07 DIAGNOSIS — R05 Cough: Secondary | ICD-10-CM | POA: Diagnosis not present

## 2017-06-23 ENCOUNTER — Other Ambulatory Visit: Payer: Self-pay | Admitting: Internal Medicine

## 2017-07-01 ENCOUNTER — Other Ambulatory Visit: Payer: Self-pay | Admitting: Internal Medicine

## 2017-07-03 NOTE — Telephone Encounter (Signed)
I spoke to pt and advised her that Dr. Diona Browner is out more than 1 yr. She will consider a nurse practioner and will call back to sch.

## 2017-07-06 ENCOUNTER — Telehealth: Payer: Self-pay | Admitting: Internal Medicine

## 2017-07-07 ENCOUNTER — Encounter: Payer: Self-pay | Admitting: Internal Medicine

## 2017-07-07 ENCOUNTER — Ambulatory Visit (INDEPENDENT_AMBULATORY_CARE_PROVIDER_SITE_OTHER): Payer: Medicare HMO | Admitting: Internal Medicine

## 2017-07-07 ENCOUNTER — Other Ambulatory Visit (INDEPENDENT_AMBULATORY_CARE_PROVIDER_SITE_OTHER): Payer: Medicare HMO

## 2017-07-07 ENCOUNTER — Ambulatory Visit (INDEPENDENT_AMBULATORY_CARE_PROVIDER_SITE_OTHER)
Admission: RE | Admit: 2017-07-07 | Discharge: 2017-07-07 | Disposition: A | Discharge status: Home / Self Care | Payer: Medicare HMO | Source: Ambulatory Visit | Attending: Internal Medicine | Admitting: Internal Medicine

## 2017-07-07 VITALS — BP 118/62 | HR 103 | Ht 63.0 in | Wt 142.0 lb

## 2017-07-07 DIAGNOSIS — J9611 Chronic respiratory failure with hypoxia: Secondary | ICD-10-CM | POA: Diagnosis not present

## 2017-07-07 DIAGNOSIS — J449 Chronic obstructive pulmonary disease, unspecified: Secondary | ICD-10-CM | POA: Diagnosis not present

## 2017-07-07 DIAGNOSIS — R300 Dysuria: Secondary | ICD-10-CM | POA: Diagnosis not present

## 2017-07-07 DIAGNOSIS — J9612 Chronic respiratory failure with hypercapnia: Secondary | ICD-10-CM

## 2017-07-07 DIAGNOSIS — R05 Cough: Secondary | ICD-10-CM | POA: Diagnosis not present

## 2017-07-07 DIAGNOSIS — J90 Pleural effusion, not elsewhere classified: Secondary | ICD-10-CM | POA: Diagnosis not present

## 2017-07-07 LAB — URINALYSIS
HGB URINE DIPSTICK: NEGATIVE
Ketones, ur: NEGATIVE
Leukocytes, UA: NEGATIVE
NITRITE: NEGATIVE
Specific Gravity, Urine: 1.025 (ref 1.000–1.030)
TOTAL PROTEIN, URINE-UPE24: NEGATIVE
URINE GLUCOSE: NEGATIVE
Urobilinogen, UA: 0.2 (ref 0.0–1.0)
pH: 6 (ref 5.0–8.0)

## 2017-07-07 MED ORDER — DOXYCYCLINE HYCLATE 100 MG PO TABS: 100.0000 mg | ORAL_TABLET | Freq: Two times a day (BID) | ORAL | 0 refills | Status: DC

## 2017-07-07 NOTE — Assessment & Plan Note (Signed)
Check u/a > doxy to cover both ? Bronchitis and uti

## 2017-07-07 NOTE — Patient Instructions (Addendum)
Please remember to go to the lab and x-ray department downstairs in the basement  for your tests - we will call you with the results when they are available.    Doxycycline 100 mg twice daily x 10 days   Keep your follow up appt as planned  - call in meantime if needed

## 2017-07-07 NOTE — Telephone Encounter (Signed)
error 

## 2017-07-07 NOTE — Progress Notes (Signed)
Subjective:   Patient ID: Linda Stevens, female    DOB: 04/11/1946  MRN: [id number]    Brief patient profile:  47 yowf quit smoking 02/22/14  with baseline no need for 02, able to chase grandchildren around some, gardening and all housework  using inhaler alb/proair  sev times a day  Plus maint singulair and acutely ill on 02/19/14:   Admit date: 02/22/2014  Discharge date: 02/27/2014 Discharge Diagnosis:   Principal Problem:  1. Acute hypoxic and hypercarbic respiratory failure secondary to asthma exacerbation and CAP (community acquired pneumonia) Active Problems:  TOBACCO ABUSE  HTN (hypertension)  Hypoxia  Hypokalemia  Acute asthma flare  Thrush  Acute respiratory failure with hypoxia and hypercapnea Discharge Condition: Stable.  Diet recommendation: Low sodium, heart healthy.  History of Present Illness:   Linda Stevens is an 71 y.o. female with a PMH of asthma treated with PRN bronchodilator therapy, ongoing tobacco abuse who presented to her PCP on 02/20/14 with a chief complaint of upper respiratory symptoms including cough productive of clear/white mucus, sinus pain and pressure, head congestion, chills but no documented fever and pain around the eyes. She was given a prednisone Dosepak which failed to alleviate her symptoms, and returned to her PCP 02/22/14 who instructed her to come to the ER for further evaluation secondary to hypoxia. Upon initial evaluation in the ED, the patient was noted to have a pulse oximetry of 86% on room air (increased to 92-94% on supplemental oxygen), mild tachycardia, and a chest x-ray concerning for right middle lobe community-acquired pneumonia.  Hospital Course by Problem:   Principal Problem:  Acute hypoxic and hypercapnic respiratory failure secondary to asthma exacerbation/community-acquired pneumonia  Treated with empiric azithromycin/Rocephin. Patient not producing sputum, so sputum culture not sent. S. Pneumo & Legionella neg.  D/C home on Ceftin through 03/02/14.  Treated with Solu-Medrol 60 mg IV every 12 hours, nebulized bronchodilator therapy, and Mucinex/antitussives. D/C home on Advair, prednisone dose pack, Tylenol #3 PRN cough.  F/U CXR clear. ABG showed some hypoxia/hypercarbia. D-dimer not elevated, ProBNP mildly elevated (given a dose of Lasix). 2-D echo relatively normal. CT chest negative except for an enlarged lymph node and RML bronchiectasis.  Send home on home oxygen with [facility] RN to assess her periodically.  Pulmonology follow up arranged. Active Problems:  Diarrhea  C.diff negative.  Thrush  Likely from steroids. Treated with Nystatin. Tobacco abuse  Counseled.  Allergic rhinitis  Continue Singulair and Flonase. HTN (hypertension)  Continue HCTZ. Hypokalemia  Likely secondary to diuretic therapy.  Resolved with oral supplementation. Procedures:   2 D Echo 02/26/14:  Impressions:  - Technically difficult; vigorous LV function; grade 1 diastolic dysfunction; significant RVH and mildly reduced RV function; mild TR but signal inadequate to estimate pulmonary pressures.      03/09/2014 1st  Pulmonary office visit/ Linda Stevens / post hosp consultation  Chief Complaint  Patient presents with  . Pulmonary Consult    Referred per Dr Rockne Menghini. Pt d/ced from [facility] on 02/27/14 after having PNA. She c/o cough- prod with minimal white to clear sputum.  She states that she does not know if she is SOB or not.   d/c on 3lpm 24/7 and advair and about 50% back to baseline just mostly staying at home and no sob on 02 room t room, off prednisone x 1 week s relapse of symptoms and still not smoking  Now on advair with raspy dry cough day > night and worsening hoarseness rec Try  symbicort 160 Take 2 puffs first thing in am and then another 2 puffs about 12 hours later - if you don't like it, resume advair Work on inhaler technique:   Only use your albuterol (proair) as a rescue medication    04/06/2014 f/u  [name] re: GOLD II COPD/ maintaining off cigs and doing fine on advair 250 bid  Chief Complaint  Patient presents with  . Followup with PFT    Pt states that her cough is much better. She states still has occ cough in the am with minimal clear sputum.   Walked half mile s 02 s stopping s 02 desat one day prior to [place]   Not using any albuterol at all  rec Try off advair  Only use your albuterol as a rescue medication   F/u prn    Started having  Breathing problems in spring 2016 got better with one visit/ short rx ? Prednisone  Happened again in fall 2016 breathing got some better and then  leg swelling developed:  Admit date: 10/01/2015 Discharge date: 10/09/2015     Recommendations for Outpatient Follow-up:  1. Rheumatology follow up recommended for elevated ANA, RF factor 2. Needs Bmet in 1 wk to check renal function and K - new start on Lasix and K+ replacement 3. Due to new diagnosis of Cirrhosis, will need to have bloodwork to evaluate for underlying cirrhosis 4. Note baseline BP appears to be systolic of 19-147 and diastolic of 82-95 5. Will need 2 L O2 at all times  Discharge Condition: stable   Discharge Diagnoses:  Principal Problem:  Acute on chronic respiratory failure with hypoxia (HCC) Active Problems:  Severe COPD (chronic obstructive pulmonary disease) (HCC)  Severe Pulmonary Hypertension  Right heart failure with reduced right ventricular function (HCC)  Hypokalemia  Polycythemia  Lower extremity edema  Pericardial effusion  Acute respiratory failure with hypoxia and hypercapnea   History of present illness:  71 year old female with history of hypertension, COPD and asthma, history of diastolic dysfunction presented to the ED due to progressively worsening dyspnea with abdominal and lower extremity swelling of the past few weeks. Her O2 sats were in the low 80s on room air. She also reported orthopnea and PND for the past 2 days. Workup in the ED  showed hypokalemia and elevated BNP of 1159. Chest x-ray showed pulmonary vascular prominence.   Hospital Course:  Acute on chronic hypoxic respiratory failure with fluid overload- secondary to pulmonary HTN, and severe COPD (A) severe pulm HTN - extensive work up reveals this is secondary to severe pulmonary artery hypertension which is possibly from underlying severe COPD  - 2-D echo done showing grade 1 diastolic dysfunction and severely elevated pulmonary artery pressure of 87 mmHg. - High resolution CT chest: moderate pericardial effusion vs pericardial thickening, possible cirrhosis and changes consistent with COPD - due to Pedal edema, Doppler lower extremity performed- negative for DVT  - Cardiology consulted for right heart catheterization which confirmed severely elevated PA pressures and therefore started on Revatio by cardiology  - pulmonary team following as well- auto-immune labs sent for PAH- ANA and Rh factor elevated- see labs below- needs Rheumatology f/u to determine the significant of this level of elevation -severe pedal edema has improved with diuretics- has diuresed about 8 L - to prevent recurrence of fluid overload, will now transition to daily oral low dose Lasix and K replacement - have asked her to do daily weights and f/u with PCP in 1 wk to  ensure her dose of Lasix is adequate for her  (B) COPD - based on PFTs, this is severe - will need O2 at all times - have started Spiriva & Dulera- cont Albuterol inhaler as needed - needs to continue to f/u with pulmonary as outpt  Polycythemia - suspected to be secondary to severe COPD- O2 initiated  Cirrhosis - noted on CT- possible cardiac cirrhosis - will need Hepatitis panel as outpt to evaluate for Hep C  Hypotension - SBP continuously in 90-100 range with a diastolic of 68-03O- this may be her norm - Continue Lasix with close monitoring of BP- ensure she is not becoming orthostatic by obtaining orthostatic  vitals in office   Procedures: 1/24ECHO : EF 12%, grade 1 diastolic dysfx, severe RV dilation, PAS 87 mmHg, mod pericardial effusion 1/26 Doppler legs >> negative 1/26 V/Q scan >> low probability for PE 1/30 Rt/Lt heart cath >> RA 9, RV 97/8/12, PA 104/49/72, PCW 18, CI 2.34, PVR 13.6 1/30 PFT >> FEV1 0.91 (40%), FEV1% 56, TLC 4.32 (88%), DLCO 38%, no BD   Post hosp ov 10/16/15 NP  rec Saline nasal rinses and gel work well for nasal congestion.  Order for humidifier for Oxygen Concentrator at home  Continue on Oxygen 2l/m , keep O2 sats >90%.  Call back if you need different prescriptions for dulera /Spiriva or need to change to Nebs to help with costs.  Follow up with Cardiology as planned     . 11/06/2015  Transition of care f/u [name] re: copd gold III/ chronic resp failure/ ? Cor pulmonale vs hepatopulmonary syndrome?  Chief Complaint  Patient presents with  . Follow-up    Pt states overall her breathing is doing well. She is currrently taking Dulera, but ins prefers advair.   doe = MMRC2 = can't walk a nl pace on a flat grade s sob even on 02 1.5 lpm sometimes increases to 2 Very confused with details of care / leg swelling resolved  rec Try symbicort 160 Take 2 puffs first thing in am and then another 2 puffs about 12 hours later.  Continue spiriva for now pending follow up pfts Wear the oxygen as much as you can at 1.5 lpm  Please schedule a follow up office visit in 4 weeks, ok to do wlh PFTs same day    12/06/2015  f/u [name] re: GOLD II / spiriva/symbiocrt maint  Chief Complaint  Patient presents with  . Follow-up    PFT done at [facility]. Pt c/o min cough with clear sputum- relates to allergies.   no change doe = MMRC2 = can't walk a nl pace on a flat grade s sob  Even on 02  Main new problem is nasal congestion/ neg resp to flonase/ irritated by 02 but can't use humidifier at low flow rate  rec Change symbicort to advair 115 Take 2 puffs first thing in am and then  another 2 puffs about 12 hours later. Work on inhaler technique:  Prednisone 10 mg take  4 each am x 2 days,   2 each am x 2 days,  1 each am x 2 days and stop  I emphasized that nasal steroids have no immediate benefit in terms   Ok to take clariton or allegra as needed for allergies Consult Dr [place] Bells next available re pulmonary hypertension      08/28/2016  f/u [name] re: GOLD III/ spiriva/advair maint  Chief Complaint  Patient presents with  . Follow-up  Pt here to recertify for o2. She c/o cough and sinus congestion for the past several days. She is coughing up white sputum. O2 sats on RA at rest are 78% and this increased to   when walking on 02 2lpm mostly in 90s  On flonase 2 each in am  Acutely worse x sev days prior to OV  With more cough/ sinus congestion  Getting confused with insurance formulary issues rec Change flonase one twice daily / when having breakthru then go 2 in am 2 in pm When nose stuffy > afrin 12 hour 2 min before your flonase x up to 5 days  Stop spiriva and start incruse one click each am zpak Prednisone 10 mg take  4 each am x 2 days,   2 each am x 2 days,  1 each am x 2 days and stop  GERD   Clotrimazole 10 mg trocheup to five times daily as needed for thrush    07/07/2017 acute extended [name] re: COPD  GOLD III/ Incruse/advair (unable to name them but sure that's what they are Chief Complaint  Patient presents with  . Follow-up    Pt not feeling well, not breathing well since this past weekend. Pt has lower back pain moving to the sides affecting her breathing, pt has O2 turned up to 3 liters instead of 2 normally.   some raspy coughing first thing in am prod just white mucus  x around a week prior to OV   Last week can't really lie down due to back pain but not pleuritic / assoc with urgency  Has not tried even tylenol for back pain but heat seems to help. Denies trauma Doe minimally worse, better on higher 02 as above     No obvious  day to day or daytime variability or assoc   purulent sputum or mucus plugs or hemoptysis or cp or chest tightness, subjective wheeze or overt sinus or hb symptoms. No unusual exp hx or h/o childhood pna/ asthma or knowledge of premature birth.    Also denies any obvious fluctuation of symptoms with weather or environmental changes or other aggravating or alleviating factors except as outlined above   Current Allergies, Complete Past Medical History, Past Surgical History, Family History, and Social History were reviewed in [employer] record.  ROS  The following are not active complaints unless bolded Hoarseness, sore throat, dysphagia, dental problems, itching, sneezing,  nasal congestion or discharge of excess mucus or purulent secretions, ear ache,   fever, chills, sweats, unintended wt loss or wt gain, classically pleuritic or exertional cp,  orthopnea pnd or leg swelling, presyncope, palpitations, abdominal pain, anorexia, nausea, vomiting, diarrhea  or change in bowel habits or change in bladder habits, change in stools or change in urine, dysuria, hematuria,  rash, arthralgias, visual complaints, headache, numbness, weakness or ataxia or problems with walking or coordination,  change in mood/affect or memory.        Current Meds  Medication Sig  . ADVAIR HFA 115-21 MCG/ACT inhaler INHALE 2 PUFFS INTO THE LUNGS TWICE DAILY  . ambrisentan (LETAIRIS) 10 MG tablet Take 1 tablet (10 mg total) by mouth daily.  [name] aspirin EC 81 MG EC tablet Take 1 tablet (81 mg total) by mouth daily.  . clotrimazole (MYCELEX) 10 MG troche Take 1 tablet (10 mg total) by mouth 5 (five) times daily. (Patient taking differently: Take 10 mg by mouth 5 (five) times daily as needed (thrush). )  .  fluticasone (FLONASE) 50 MCG/ACT nasal spray SHAKE WELL AND INSTILL 2 SPRAYS INTO EACH NOSTRIL ONCE DAILY  . furosemide (LASIX) 20 MG tablet TAKE 1 TABLET BY MOUTH EVERY DAY  . INCRUSE ELLIPTA 62.5 MCG/INH  AEPB INHALE 1 PUFF INTO THE LUNGS EVERY MORNING  . ipratropium (ATROVENT) 0.06 % nasal spray Place 2 sprays into both nostrils 2 (two) times daily.  . OXYGEN 2 to 2lpm 24/7  . potassium chloride (K-DUR) 10 MEQ tablet Take 1 tablet (10 mEq total) by mouth daily.  . tadalafil, PAH, (ADCIRCA) 20 MG tablet Take 2 tablets (40 mg total) by mouth daily.                    Objective:   Physical Exam  04/06/2014        154 > 12/06/2015  148  > 08/28/2016   145 > 07/07/2017  142   03/09/14 147 lb (66.679 kg)  02/22/14 150 lb 3.2 oz (68.13 kg)  02/22/14 147 lb 8 oz (66.906 kg)     Chronically but not acutely ill appearing elderly  amb wf nad  - Note on arrival 02 sats  90% on 3lpm  Pulsed   HEENT: nl dentition, turbinates bilaterally, and oropharynx. Nl external ear canals without cough reflex   NECK :  without JVD/Nodes/TM/ nl carotid upstrokes bilaterally   LUNGS: no acc muscle use,  slt barrel contour chest with mild diffuse tenderness bilaterally to fist percussion/ distant bs s localized wheezes/ rhonchi or bronchial changes with decreased bs in R base / dullness    CV:  RRR  no s3 or murmur or increase in P2, and no edema   ABD:  soft and nontender with nl inspiratory excursion in the supine position. No bruits or organomegaly appreciated, bowel sounds nl  MS:  Nl gait/ ext warm without deformities, calf tenderness, cyanosis or clubbing No obvious joint restrictions   SKIN: warm and dry without lesions    NEURO:  alert, approp, nl sensorium with  no motor or cerebellar deficits apparent.       CXR PA and Lateral:   07/07/2017 :    I personally reviewed images and   impression as follows:    moderate new R pleural effusion       Labs ordered 07/07/2017   U/a  Neg                     Assessment & Plan:

## 2017-07-08 DIAGNOSIS — R0602 Shortness of breath: Secondary | ICD-10-CM | POA: Diagnosis not present

## 2017-07-08 DIAGNOSIS — I10 Essential (primary) hypertension: Secondary | ICD-10-CM | POA: Diagnosis not present

## 2017-07-08 DIAGNOSIS — J45901 Unspecified asthma with (acute) exacerbation: Secondary | ICD-10-CM | POA: Diagnosis not present

## 2017-07-08 DIAGNOSIS — J9611 Chronic respiratory failure with hypoxia: Secondary | ICD-10-CM | POA: Insufficient documentation

## 2017-07-08 DIAGNOSIS — J45909 Unspecified asthma, uncomplicated: Secondary | ICD-10-CM | POA: Diagnosis not present

## 2017-07-08 DIAGNOSIS — R062 Wheezing: Secondary | ICD-10-CM | POA: Diagnosis not present

## 2017-07-08 DIAGNOSIS — J449 Chronic obstructive pulmonary disease, unspecified: Secondary | ICD-10-CM | POA: Diagnosis not present

## 2017-07-08 DIAGNOSIS — J90 Pleural effusion, not elsewhere classified: Secondary | ICD-10-CM | POA: Insufficient documentation

## 2017-07-08 DIAGNOSIS — J9601 Acute respiratory failure with hypoxia: Secondary | ICD-10-CM | POA: Diagnosis not present

## 2017-07-08 DIAGNOSIS — J188 Other pneumonia, unspecified organism: Secondary | ICD-10-CM | POA: Diagnosis not present

## 2017-07-08 DIAGNOSIS — J9612 Chronic respiratory failure with hypercapnia: Secondary | ICD-10-CM

## 2017-07-08 DIAGNOSIS — R05 Cough: Secondary | ICD-10-CM | POA: Diagnosis not present

## 2017-07-08 NOTE — Assessment & Plan Note (Signed)
Has h/o PH but low wedge 01/26/17 so most likely this is parapneumonic in nature but given hx of ca need to r/u malignancy as well   Discussed in detail all the  indications, usual  risks and alternatives  relative to the benefits with patient who agrees to proceed with  Thoracentesis asap  I had an extended discussion with the patient reviewing all relevant studies completed to date and  lasting 25 minutes of a 40  minute acute office  visit addressing multiplesevere non-specific but potentially very serious refractory respiratory symptoms of uncertain and potentially multiple  etiologies.   Each maintenance medication was reviewed in detail including most importantly the difference between maintenance and prns and under what circumstances the prns are to be triggered using an action plan format that is not reflected in the computer generated alphabetically organized AVS.    Please see AVS for specific instructions unique to this office visit that I personally wrote and verbalized to the the pt in detail and then reviewed with pt  by my nurse highlighting any changes in therapy/plan of care  recommended at today's visit.

## 2017-07-08 NOTE — Assessment & Plan Note (Signed)
Quit smoking 02/2014 - PFT's  04/06/14   FEV1 1.36 (57 % ) ratio 63  p 9 % improvement from saba with DLCO  51 % corrects to 71 % for alv volume   - PFT's  10/08/2015  FEV1 0.91 (40 % ) ratio 56  p no % improvement from saba with DLCO 38 % corrects to 63 % for alv volume   - PFT's  12/06/2015  FEV1 1.17  (52 % ) ratio 62  p 16 % improvement from saba p no rx prior to study with DLCO  28 % corrects to 44 % for alv volume   - 08/28/2016  extensive coaching HFA effectiveness =    75%  Acute flare of sob in setting of new R effusion. Note Note that pleural effusion and copd have the same effect on insp muscles/mechanics (both shorten their length prior to inspiration making them weaker with less force reserve) so they are synergistic in causing sob.   rec rx possible copd exac with doxy/ tap  R effusion asap (see separate a/p)   Discussed in detail all the  indications, usual  risks and alternatives  relative to the benefits with patient who agrees to proceed with w/u as outlined

## 2017-07-08 NOTE — Assessment & Plan Note (Signed)
Discharged   on 02  10/09/15 p admit  - HCO3  On 01/26/17  = 32   As of 07/07/2017 on 2-3 lpm 24/7

## 2017-07-08 NOTE — Progress Notes (Signed)
Spoke with pt and notified of results per Dr. Wert. Pt verbalized understanding and denied any questions. 

## 2017-07-15 ENCOUNTER — Ambulatory Visit (HOSPITAL_COMMUNITY)
Admission: RE | Admit: 2017-07-15 | Discharge: 2017-07-15 | Disposition: A | Discharge status: Home / Self Care | Payer: Medicare HMO | Source: Ambulatory Visit | Attending: Radiology | Admitting: Radiology

## 2017-07-15 ENCOUNTER — Ambulatory Visit (HOSPITAL_COMMUNITY)
Admission: RE | Admit: 2017-07-15 | Discharge: 2017-07-15 | Disposition: A | Discharge status: Home / Self Care | Payer: Medicare HMO | Source: Ambulatory Visit | Attending: Internal Medicine | Admitting: Internal Medicine

## 2017-07-15 DIAGNOSIS — I700 Atherosclerosis of aorta: Secondary | ICD-10-CM | POA: Insufficient documentation

## 2017-07-15 DIAGNOSIS — Z9889 Other specified postprocedural states: Secondary | ICD-10-CM

## 2017-07-15 DIAGNOSIS — R846 Abnormal cytological findings in specimens from respiratory organs and thorax: Secondary | ICD-10-CM | POA: Diagnosis not present

## 2017-07-15 DIAGNOSIS — J90 Pleural effusion, not elsewhere classified: Secondary | ICD-10-CM | POA: Insufficient documentation

## 2017-07-15 LAB — BODY FLUID CELL COUNT WITH DIFFERENTIAL
LYMPHS FL: 95 %
Monocyte-Macrophage-Serous Fluid: 5 % — ABNORMAL LOW (ref 50–90)
WBC FLUID: 1974 uL — AB (ref 0–1000)

## 2017-07-15 LAB — GLUCOSE, PLEURAL OR PERITONEAL FLUID: Glucose, Fluid: 85 mg/dL

## 2017-07-15 LAB — PROTEIN, PLEURAL OR PERITONEAL FLUID: Total protein, fluid: 3.5 g/dL

## 2017-07-15 LAB — ALBUMIN, PLEURAL OR PERITONEAL FLUID: Albumin, Fluid: 1.9 g/dL

## 2017-07-15 MED ORDER — LIDOCAINE HCL 2 % IJ SOLN
INTRAMUSCULAR | Status: AC
  Filled 2017-07-15: qty 10

## 2017-07-15 NOTE — Procedures (Addendum)
Ultrasound-guided diagnostic and therapeutic right thoracentesis performed yielding 580 cc of  yellow  fluid. No immediate complications. Follow-up chest x-ray pending. The fluid was sent to the lab for preordered studies. Due to pt chest discomfort only the above amount of fluid was removed today.

## 2017-07-17 ENCOUNTER — Ambulatory Visit: Payer: Medicare HMO | Admitting: Internal Medicine

## 2017-07-17 ENCOUNTER — Encounter: Payer: Self-pay | Admitting: Internal Medicine

## 2017-07-17 ENCOUNTER — Ambulatory Visit (INDEPENDENT_AMBULATORY_CARE_PROVIDER_SITE_OTHER)
Admission: RE | Admit: 2017-07-17 | Discharge: 2017-07-17 | Disposition: A | Discharge status: Home / Self Care | Payer: Medicare HMO | Source: Ambulatory Visit | Attending: Internal Medicine | Admitting: Internal Medicine

## 2017-07-17 VITALS — BP 112/60 | HR 109 | Ht 63.0 in | Wt 141.0 lb

## 2017-07-17 DIAGNOSIS — J90 Pleural effusion, not elsewhere classified: Secondary | ICD-10-CM | POA: Diagnosis not present

## 2017-07-17 DIAGNOSIS — J9611 Chronic respiratory failure with hypoxia: Secondary | ICD-10-CM | POA: Diagnosis not present

## 2017-07-17 DIAGNOSIS — J449 Chronic obstructive pulmonary disease, unspecified: Secondary | ICD-10-CM

## 2017-07-17 MED ORDER — FLUTICASONE-UMECLIDIN-VILANT 100-62.5-25 MCG/INH IN AEPB: 1.0000 | INHALATION_SPRAY | Freq: Every day | RESPIRATORY_TRACT | 0 refills | Status: DC

## 2017-07-17 MED ORDER — ALBUTEROL SULFATE HFA 108 (90 BASE) MCG/ACT IN AERS: 2.0000 | INHALATION_SPRAY | Freq: Four times a day (QID) | RESPIRATORY_TRACT | 11 refills | Status: DC | PRN

## 2017-07-17 MED ORDER — FLUTICASONE-UMECLIDIN-VILANT 100-62.5-25 MCG/INH IN AEPB
1.0000 | INHALATION_SPRAY | Freq: Every day | RESPIRATORY_TRACT | 11 refills | Status: DC
Start: 2017-07-17 — End: 2018-07-20

## 2017-07-17 NOTE — Patient Instructions (Addendum)
Plan A = Automatic = Trelegy one click x two good drags  Stop incruse and advair    Plan B = Backup Only use your albuterol (Proair=Red/ proventil=yellow) as a rescue medication to be used if you can't catch your breath by resting or doing a relaxed purse lip breathing pattern.  - The less you use it, the better it will work when you need it. - Ok to use the inhaler up to 2 puffs  every 4 hours if you must but call for appointment if use goes up over your usual need - Don't leave home without it !!  (think of it like the spare tire for your car)     Please remember to go to the  x-ray department downstairs in the basement  for your tests - we will call you with the results when they are available.   Please schedule a follow up office visit in 4 weeks, sooner if needed  - late add:  CT chest with contrast needed due to persistent atx on R base

## 2017-07-17 NOTE — Progress Notes (Signed)
Subjective:   Patient ID: Linda Stevens, female    DOB: [birth year]  MRN: [id number]    Brief patient profile:  51 yowf quit smoking 02/22/14  with  GOLD II/III  copd/ cor pulmonale vs HP syndrome related to cirrhosis    Procedures: 1/24/17ECHO : EF 35%, grade 1 diastolic dysfx, severe RV dilation, PAS 87 mmHg, mod pericardial effusion 10/04/15  Doppler legs >> negative 10/04/15  V/Q scan >> low probability for PE 10/08/15 Rt/Lt heart cath >> RA 9, RV 97/8/12, PA 104/49/72, PCW 18, CI 2.34, PVR 13.6 10/05/15  PFT >> FEV1 0.91 (40%), FEV1% 56, TLC 4.32 (88%), DLCO 38%, no BD    11/06/2015  Transition of care f/u ov/[name] re: copd gold III/ chronic resp failure/ ? Cor pulmonale vs hepatopulmonary syndrome?  Chief Complaint  Patient presents with  . Follow-up    Pt states overall her breathing is doing well. She is currrently taking Dulera, but ins prefers advair.   doe = MMRC2 = can't walk a nl pace on a flat grade s sob even on 02 1.5 lpm sometimes increases to 2 Very confused with details of care / leg swelling resolved  rec Try symbicort 160 Take 2 puffs first thing in am and then another 2 puffs about 12 hours later.  Continue spiriva for now pending follow up pfts Wear the oxygen as much as you can at 1.5 lpm  Please schedule a follow up office visit in 4 weeks, ok to do wlh PFTs same day    12/06/2015  f/u ov/Ettel Albergo re: GOLD II / spiriva/symbiocrt maint  Chief Complaint  Patient presents with  . Follow-up    PFT done at [facility]. Pt c/o min cough with clear sputum- relates to allergies.   no change doe = MMRC2 = can't walk a nl pace on a flat grade s sob  Even on 02  Main new problem is nasal congestion/ neg resp to flonase/ irritated by 02 but can't use humidifier at low flow rate  rec Change symbicort to advair 115 Take 2 puffs first thing in am and then another 2 puffs about 12 hours later. Work on inhaler technique:  Prednisone 10 mg take  4 each am x 2 days,   2 each am x 2  days,  1 each am x 2 days and stop  I emphasized that nasal steroids have no immediate benefit in terms   Ok to take clariton or allegra as needed for allergies Consult Dr [place] Bells next available re pulmonary hypertension      08/28/2016  f/u ov/[name] re: GOLD III/ spiriva/advair maint  Chief Complaint  Patient presents with  . Follow-up    Pt here to recertify for o2. She c/o cough and sinus congestion for the past several days. She is coughing up white sputum. O2 sats on RA at rest are 78% and this increased to   when walking on 02 2lpm mostly in 90s  On flonase 2 each in am  Acutely worse x sev days prior to OV  With more cough/ sinus congestion  Getting confused with insurance formulary issues rec Change flonase one twice daily / when having breakthru then go 2 in am 2 in pm When nose stuffy > afrin 12 hour 2 min before your flonase x up to 5 days  Stop spiriva and start incruse one click each am zpak Prednisone 10 mg take  4 each am x 2 days,   2 each am x  2 days,  1 each am x 2 days and stop  GERD   Clotrimazole 10 mg trocheup to five times daily as needed for thrush    07/07/2017 acute extended ov/[name] re: COPD  GOLD III/ Incruse/advair (unable to name them but sure that's what they are Chief Complaint  Patient presents with  . Follow-up    Pt not feeling well, not breathing well since this past weekend. Pt has lower back pain moving to the sides affecting her breathing, pt has O2 turned up to 3 liters instead of 2 normally.   some raspy coughing first thing in am prod just white mucus  x around a week prior to OV   Last week can't really lie down due to back pain but not pleuritic / assoc with urgency  Has not tried even tylenol for back pain but heat seems to help. Denies trauma Doe minimally worse, better on higher 02 as above  rec   Doxycycline 100 mg twice daily x 10 days  Please remember to go to the lab and x-ray department: dx new R effusion    thoracentesis   07/15/2017   X 580 cc with wbc's 1974 with L >> P .  Prot 3.5 , nl glucose   Alb 1.9  cyt neg with pred of T lymphocytes         07/17/2017  f/u ov/[name] re:  COPD III and 02 2.5 increases to 4-5 with ex / new R effusion/ exudate  Chief Complaint  Patient presents with  . Follow-up    Breathing is much improved. She is coughing less sputum is minimal and clear.   sleeps at 45 degrees on 2.5 lpm and able to lie flatter since tap  Overall back near baseline p tap  No obvious day to day or daytime variability or assoc excess/ purulent sputum or mucus plugs or hemoptysis or cp or chest tightness, subjective wheeze or overt sinus or hb symptoms. No unusual exp hx or h/o childhood pna/ asthma or knowledge of premature birth.  Sleeping ok on 2.5lpm at 45 degrees without nocturnal  or early am exacerbation  of respiratory  c/o's or need for noct saba. Also denies any obvious fluctuation of symptoms with weather or environmental changes or other aggravating or alleviating factors except as outlined above   Current Allergies, Complete Past Medical History, Past Surgical History, Family History, and Social History were reviewed in [employer] record.  ROS  The following are not active complaints unless bolded Hoarseness, sore throat, dysphagia, dental problems, itching, sneezing,  nasal congestion or discharge of excess mucus or purulent secretions, ear ache,   fever, chills, sweats, unintended wt loss or wt gain, classically pleuritic or exertional cp,  orthopnea pnd or leg swelling, presyncope, palpitations, abdominal pain, anorexia, nausea, vomiting, diarrhea  or change in bowel habits or change in bladder habits, change in stools or change in urine, dysuria, hematuria,  rash, arthralgias, visual complaints, headache, numbness, weakness or ataxia or problems with walking or coordination,  change in mood/affect or memory.        Current Meds  Medication Sig  . acetaminophen  (TYLENOL) 325 MG tablet Take 650 mg every 6 (six) hours as needed by mouth.  [name] ambrisentan (LETAIRIS) 10 MG tablet Take 1 tablet (10 mg total) by mouth daily.  [name] aspirin EC 81 MG EC tablet Take 1 tablet (81 mg total) by mouth daily.  . clotrimazole (MYCELEX) 10 MG troche Take 1 tablet (  10 mg total) by mouth 5 (five) times daily. (Patient taking differently: Take 10 mg by mouth 5 (five) times daily as needed (thrush). )  . fluticasone (FLONASE) 50 MCG/ACT nasal spray SHAKE WELL AND INSTILL 2 SPRAYS INTO EACH NOSTRIL ONCE DAILY  . furosemide (LASIX) 20 MG tablet TAKE 1 TABLET BY MOUTH EVERY DAY  . ipratropium (ATROVENT) 0.06 % nasal spray Place 2 sprays into both nostrils 2 (two) times daily.  . OXYGEN 2 to 2lpm 24/7  . potassium chloride (K-DUR) 10 MEQ tablet Take 1 tablet (10 mEq total) by mouth daily.  . tadalafil, PAH, (ADCIRCA) 20 MG tablet Take 2 tablets (40 mg total) by mouth daily.  . _0  ADVAIR HFA 115-21 MCG/ACT inhaler INHALE 2 PUFFS INTO THE LUNGS TWICE DAILY  .  doxycycline (VIBRA-TABS) 100 MG tablet Take 1 tablet (100 mg total) by mouth 2 (two) times daily.  . [  INCRUSE ELLIPTA 62.5 MCG/INH AEPB INHALE 1 PUFF INTO THE LUNGS EVERY MORNING                               Objective:   Physical Exam  04/06/2014        154 > 12/06/2015  148  > 08/28/2016   145 > 07/07/2017  142>07/17/2017  141   03/09/14 147 lb (66.679 kg)  02/22/14 150 lb 3.2 oz (68.13 kg)  02/22/14 147 lb 8 oz (66.906 kg)     Chronically but not acutely ill appearing elderly  amb wf nad  - Note on arrival 02 sats  100% on 3lpm  Pulsed   HEENT: nl dentition, turbinates bilaterally, and oropharynx. Nl external ear canals without cough reflex   NECK :  without JVD/Nodes/TM/ nl carotid upstrokes bilaterally   LUNGS: no acc muscle use,  Nl contour chest with distant bs but no wheeze localized or otherwise in R base / minimal dullness at the R base    CV:  RRR  no s3 or murmur  -  Def  increase  in P2, and no significant peripheral  edema   ABD:  soft and nontender with nl inspiratory excursion in the supine position. No bruits or organomegaly appreciated, bowel sounds nl  MS:  Nl gait/ ext warm without deformities, calf tenderness, cyanosis or clubbing No obvious joint restrictions   SKIN: warm and dry without lesions    NEURO:  alert, approp, nl sensorium with  no motor or cerebellar deficits apparent.    CXR PA and Lateral:   07/17/2017 :    I personally reviewed images and agree with radiology impression as follows:     Persistent and slightly enlarging right pleural effusion. Overlying atelectasis persists.  Very small left effusion and minimal left basilar atelectasis.  Underlying emphysema.                           Assessment & Plan:

## 2017-07-18 ENCOUNTER — Encounter: Payer: Self-pay | Admitting: Internal Medicine

## 2017-07-18 LAB — BODY FLUID CULTURE: CULTURE: NO GROWTH

## 2017-07-18 NOTE — Assessment & Plan Note (Signed)
Adequate control on present rx, reviewed in detail with pt > no change in rx needed  = 3lpm 24/7 since likely has component of cor pulmonale

## 2017-07-18 NOTE — Assessment & Plan Note (Signed)
New dx 07/07/2017 >   thoracentesis  07/15/2017   X 580 cc with wbc's 1974 with L >> P .  Prot 3.5 , nl glucose   Alb 1.9  cyt neg with pred of T lymphocytes   Unfortunately the RML/ RLL did not re-expand well s/p tap so much of what I was seeing on plain cxr was primarily atx >> CT chest next step in w/u    Discussed in detail all the  indications, usual  risks and alternatives  relative to the benefits with patient/husband/daughter  who agree  to proceed with w/u as outlined.

## 2017-07-18 NOTE — Assessment & Plan Note (Signed)
Quit smoking 02/2014 - PFT's  04/06/14   FEV1 1.36 (57 % ) ratio 63  p 9 % improvement from saba with DLCO  51 % corrects to 71 % for alv volume   - PFT's  10/08/2015  FEV1 0.91 (40 % ) ratio 56  p no % improvement from saba with DLCO 38 % corrects to 63 % for alv volume   - PFT's  12/06/2015  FEV1 1.17  (52 % ) ratio 62  p 16 % improvement from saba p no rx prior to study with DLCO  28 % corrects to 44 % for alv volume    - 07/17/2017  extensive coaching HFA effectiveness =    75% from a baseline of 25%  - 07/17/2017  After extensive coaching device  effectiveness =     90% with dpi > try change to trelegy    Group D in terms of symptom/risk and laba/lama/ICS  therefore appropriate rx at this point.    I had an extended discussion with the patient reviewing all relevant studies completed to date and  lasting 15 to 20 minutes of a 25 minute visit    Each maintenance medication was reviewed in detail including most importantly the difference between maintenance and prns and under what circumstances the prns are to be triggered using an action plan format that is not reflected in the computer generated alphabetically organized AVS.    Please see AVS for specific instructions unique to this visit that I personally wrote and verbalized to the the pt in detail and then reviewed with pt  by my nurse highlighting any  changes in therapy recommended at today's visit to their plan of care.

## 2017-07-20 ENCOUNTER — Other Ambulatory Visit (INDEPENDENT_AMBULATORY_CARE_PROVIDER_SITE_OTHER): Payer: Medicare HMO

## 2017-07-20 ENCOUNTER — Other Ambulatory Visit: Payer: Self-pay | Admitting: Internal Medicine

## 2017-07-20 DIAGNOSIS — J449 Chronic obstructive pulmonary disease, unspecified: Secondary | ICD-10-CM

## 2017-07-20 DIAGNOSIS — J90 Pleural effusion, not elsewhere classified: Secondary | ICD-10-CM

## 2017-07-20 LAB — BASIC METABOLIC PANEL
BUN: 14 mg/dL (ref 6–23)
CALCIUM: 9.3 mg/dL (ref 8.4–10.5)
CO2: 34 mEq/L — ABNORMAL HIGH (ref 19–32)
Chloride: 101 mEq/L (ref 96–112)
Creatinine, Ser: 0.71 mg/dL (ref 0.40–1.20)
GFR: 86.69 mL/min (ref >60.00)
Glucose, Bld: 110 mg/dL — ABNORMAL HIGH (ref 70–99)
POTASSIUM: 4.1 meq/L (ref 3.5–5.1)
SODIUM: 142 meq/L (ref 135–145)

## 2017-07-20 NOTE — Progress Notes (Signed)
Spoke with pt and notified of results per Dr. Melvyn Novas. Pt verbalized understanding and denied any questions.

## 2017-07-20 NOTE — Progress Notes (Signed)
Spoke with pt and notified of results per Dr. Wert. Pt verbalized understanding and denied any questions. 

## 2017-07-21 NOTE — Progress Notes (Signed)
Spoke with pt and notified of results per Dr. Wert. Pt verbalized understanding and denied any questions. 

## 2017-07-24 ENCOUNTER — Ambulatory Visit (INDEPENDENT_AMBULATORY_CARE_PROVIDER_SITE_OTHER)
Admission: RE | Admit: 2017-07-24 | Discharge: 2017-07-24 | Disposition: A | Discharge status: Home / Self Care | Payer: Medicare HMO | Source: Ambulatory Visit | Attending: Internal Medicine | Admitting: Internal Medicine

## 2017-07-24 DIAGNOSIS — J90 Pleural effusion, not elsewhere classified: Secondary | ICD-10-CM | POA: Diagnosis not present

## 2017-07-24 MED ORDER — IOPAMIDOL (ISOVUE-300) INJECTION 61%
80.0000 mL | Freq: Once | INTRAVENOUS | Status: AC | PRN
  Administered 2017-07-24: 80 mL via INTRAVENOUS

## 2017-07-27 ENCOUNTER — Telehealth: Payer: Self-pay | Admitting: Internal Medicine

## 2017-07-27 ENCOUNTER — Other Ambulatory Visit (HOSPITAL_COMMUNITY): Payer: Self-pay | Admitting: *Deleted

## 2017-07-27 DIAGNOSIS — J90 Pleural effusion, not elsewhere classified: Secondary | ICD-10-CM

## 2017-07-27 MED ORDER — TADALAFIL (PAH) 20 MG PO TABS: 40.0000 mg | ORAL_TABLET | Freq: Every day | ORAL | 3 refills | Status: DC

## 2017-07-27 NOTE — Telephone Encounter (Signed)
Tylenol #3 one up to every 4 hours as needed for pain and take one 1 hour before procedure   #16  Proceed with thoracentsis with usual studies plus flow cytometry

## 2017-07-27 NOTE — Progress Notes (Signed)
Spoke with the pt and notified of results/recs per MW  She had some questions and I have forwarded MW a msg to address

## 2017-07-27 NOTE — Telephone Encounter (Signed)
Called and spoke with the pt to give ct chest results/recs:   Notes recorded by Tanda Rockers, MD on 07/27/2017 at 7:42 AM EST Call patient : Study is c/w Recurrent large amt of fluid which we should try to drain a second time and send for same studies to try to understand better what's causing it (preferably this week but could put off until next if symptoms are tolerable)  She states she does not want the thoracentesis done again b/c it was painful  She wants to speak with MW more about this  Please advise, thanks

## 2017-07-28 MED ORDER — ACETAMINOPHEN-CODEINE #3 300-30 MG PO TABS: ORAL_TABLET | ORAL | 0 refills | Status: DC

## 2017-07-28 NOTE — Telephone Encounter (Signed)
Spoke with pt. She is aware of MW's recommendation. Rx has been called in. Order has been placed for thoracentesis. Message will be routed to [place] to sign the order as I do not know what labs MW likes performed.

## 2017-07-28 NOTE — Telephone Encounter (Signed)
Labs orders associated with thora done

## 2017-07-29 ENCOUNTER — Ambulatory Visit (HOSPITAL_COMMUNITY)
Admission: RE | Admit: 2017-07-29 | Discharge: 2017-07-29 | Disposition: A | Discharge status: Home / Self Care | Payer: Medicare HMO | Source: Ambulatory Visit | Attending: Internal Medicine | Admitting: Internal Medicine

## 2017-07-29 ENCOUNTER — Ambulatory Visit (HOSPITAL_COMMUNITY)
Admission: RE | Admit: 2017-07-29 | Discharge: 2017-07-29 | Disposition: A | Discharge status: Home / Self Care | Payer: Medicare HMO | Source: Ambulatory Visit | Attending: Radiology | Admitting: Radiology

## 2017-07-29 DIAGNOSIS — J181 Lobar pneumonia, unspecified organism: Secondary | ICD-10-CM | POA: Diagnosis not present

## 2017-07-29 DIAGNOSIS — Z9889 Other specified postprocedural states: Secondary | ICD-10-CM | POA: Diagnosis not present

## 2017-07-29 DIAGNOSIS — J9811 Atelectasis: Secondary | ICD-10-CM | POA: Diagnosis not present

## 2017-07-29 DIAGNOSIS — I700 Atherosclerosis of aorta: Secondary | ICD-10-CM | POA: Insufficient documentation

## 2017-07-29 DIAGNOSIS — D72820 Lymphocytosis (symptomatic): Secondary | ICD-10-CM | POA: Insufficient documentation

## 2017-07-29 DIAGNOSIS — I878 Other specified disorders of veins: Secondary | ICD-10-CM | POA: Diagnosis not present

## 2017-07-29 DIAGNOSIS — R846 Abnormal cytological findings in specimens from respiratory organs and thorax: Secondary | ICD-10-CM | POA: Diagnosis not present

## 2017-07-29 DIAGNOSIS — J90 Pleural effusion, not elsewhere classified: Secondary | ICD-10-CM | POA: Diagnosis not present

## 2017-07-29 LAB — BODY FLUID CELL COUNT WITH DIFFERENTIAL
Lymphs, Fluid: 85 %
Monocyte-Macrophage-Serous Fluid: 15 % — ABNORMAL LOW (ref 50–90)
Total Nucleated Cell Count, Fluid: 1219 cu mm — ABNORMAL HIGH (ref 0–1000)

## 2017-07-29 LAB — ALBUMIN, PLEURAL OR PERITONEAL FLUID: ALBUMIN FL: 1.7 g/dL

## 2017-07-29 LAB — PROTEIN, PLEURAL OR PERITONEAL FLUID: Total protein, fluid: 3.2 g/dL

## 2017-07-29 LAB — GLUCOSE, PLEURAL OR PERITONEAL FLUID: Glucose, Fluid: 83 mg/dL

## 2017-07-29 MED ORDER — LIDOCAINE HCL 2 % IJ SOLN
INTRAMUSCULAR | Status: AC
  Filled 2017-07-29: qty 10

## 2017-07-29 NOTE — Procedures (Signed)
Ultrasound-guided diagnostic and therapeutic right thoracentesis performed yielding 310 cc of yellow fluid. No immediate complications. Follow-up chest x-ray pending.The fluid was sent to the lab for preordered studies. The collection was multiloculated.

## 2017-08-01 LAB — BODY FLUID CULTURE
CULTURE: NO GROWTH
Gram Stain: NONE SEEN

## 2017-08-03 ENCOUNTER — Telehealth (HOSPITAL_COMMUNITY): Payer: Self-pay | Admitting: *Deleted

## 2017-08-03 ENCOUNTER — Other Ambulatory Visit: Payer: Self-pay | Admitting: Internal Medicine

## 2017-08-03 DIAGNOSIS — J90 Pleural effusion, not elsewhere classified: Secondary | ICD-10-CM

## 2017-08-03 LAB — PH, BODY FLUID: pH, Body Fluid: 7.7

## 2017-08-03 NOTE — Telephone Encounter (Signed)
Pt called stating she has had 2 thoracentesis this month and Dr Melvyn Novas is referring her to Dr Roxan Hockey for this and she is scared.  She states Dr Melvyn Novas  has done testing and ruled out cancer.  She states she would like to see Dr Haroldine Laws to discuss with him and get his opinion on this.  She denies any SOB or swelling at this time.  Appt sch for 08/11/17 at 3:20 w/Dr Bensimhon.  Advised pt if SOB returned before then to call our office back, she is agreeable.

## 2017-08-03 NOTE — Progress Notes (Signed)
Spoke with pt and notified of results per Dr. Wert. Pt verbalized understanding and denied any questions. 

## 2017-08-07 ENCOUNTER — Other Ambulatory Visit (HOSPITAL_COMMUNITY): Payer: Self-pay | Admitting: *Deleted

## 2017-08-07 DIAGNOSIS — J449 Chronic obstructive pulmonary disease, unspecified: Secondary | ICD-10-CM | POA: Diagnosis not present

## 2017-08-07 DIAGNOSIS — R0602 Shortness of breath: Secondary | ICD-10-CM | POA: Diagnosis not present

## 2017-08-07 DIAGNOSIS — R062 Wheezing: Secondary | ICD-10-CM | POA: Diagnosis not present

## 2017-08-07 DIAGNOSIS — J188 Other pneumonia, unspecified organism: Secondary | ICD-10-CM | POA: Diagnosis not present

## 2017-08-07 DIAGNOSIS — R05 Cough: Secondary | ICD-10-CM | POA: Diagnosis not present

## 2017-08-07 DIAGNOSIS — J45901 Unspecified asthma with (acute) exacerbation: Secondary | ICD-10-CM | POA: Diagnosis not present

## 2017-08-07 DIAGNOSIS — I10 Essential (primary) hypertension: Secondary | ICD-10-CM | POA: Diagnosis not present

## 2017-08-07 DIAGNOSIS — J45909 Unspecified asthma, uncomplicated: Secondary | ICD-10-CM | POA: Diagnosis not present

## 2017-08-07 DIAGNOSIS — J9601 Acute respiratory failure with hypoxia: Secondary | ICD-10-CM | POA: Diagnosis not present

## 2017-08-07 MED ORDER — TADALAFIL (PAH) 20 MG PO TABS: 40.0000 mg | ORAL_TABLET | Freq: Every day | ORAL | 3 refills | Status: DC

## 2017-08-10 ENCOUNTER — Telehealth (HOSPITAL_COMMUNITY): Payer: Self-pay | Admitting: Cardiology

## 2017-08-10 ENCOUNTER — Ambulatory Visit (HOSPITAL_COMMUNITY)
Admission: RE | Admit: 2017-08-10 | Discharge: 2017-08-10 | Disposition: A | Discharge status: Home / Self Care | Payer: Medicare HMO | Source: Ambulatory Visit | Attending: Internal Medicine | Admitting: Internal Medicine

## 2017-08-10 ENCOUNTER — Encounter (HOSPITAL_COMMUNITY): Payer: Self-pay | Admitting: *Deleted

## 2017-08-10 VITALS — BP 132/62 | HR 105 | Wt 140.2 lb

## 2017-08-10 DIAGNOSIS — Z9981 Dependence on supplemental oxygen: Secondary | ICD-10-CM | POA: Diagnosis not present

## 2017-08-10 DIAGNOSIS — I2729 Other secondary pulmonary hypertension: Secondary | ICD-10-CM | POA: Diagnosis not present

## 2017-08-10 DIAGNOSIS — I50810 Right heart failure, unspecified: Secondary | ICD-10-CM | POA: Diagnosis present

## 2017-08-10 DIAGNOSIS — I2510 Atherosclerotic heart disease of native coronary artery without angina pectoris: Secondary | ICD-10-CM | POA: Insufficient documentation

## 2017-08-10 DIAGNOSIS — I313 Pericardial effusion (noninflammatory): Secondary | ICD-10-CM | POA: Insufficient documentation

## 2017-08-10 DIAGNOSIS — I2721 Secondary pulmonary arterial hypertension: Secondary | ICD-10-CM

## 2017-08-10 DIAGNOSIS — I110 Hypertensive heart disease with heart failure: Secondary | ICD-10-CM | POA: Insufficient documentation

## 2017-08-10 DIAGNOSIS — K7460 Unspecified cirrhosis of liver: Secondary | ICD-10-CM | POA: Insufficient documentation

## 2017-08-10 DIAGNOSIS — I5033 Acute on chronic diastolic (congestive) heart failure: Secondary | ICD-10-CM | POA: Insufficient documentation

## 2017-08-10 DIAGNOSIS — Z818 Family history of other mental and behavioral disorders: Secondary | ICD-10-CM | POA: Insufficient documentation

## 2017-08-10 DIAGNOSIS — D751 Secondary polycythemia: Secondary | ICD-10-CM | POA: Diagnosis not present

## 2017-08-10 DIAGNOSIS — Z87891 Personal history of nicotine dependence: Secondary | ICD-10-CM | POA: Diagnosis not present

## 2017-08-10 DIAGNOSIS — Z825 Family history of asthma and other chronic lower respiratory diseases: Secondary | ICD-10-CM | POA: Insufficient documentation

## 2017-08-10 DIAGNOSIS — J449 Chronic obstructive pulmonary disease, unspecified: Secondary | ICD-10-CM | POA: Diagnosis not present

## 2017-08-10 DIAGNOSIS — Z8249 Family history of ischemic heart disease and other diseases of the circulatory system: Secondary | ICD-10-CM | POA: Diagnosis not present

## 2017-08-10 DIAGNOSIS — I509 Heart failure, unspecified: Secondary | ICD-10-CM | POA: Diagnosis not present

## 2017-08-10 DIAGNOSIS — I700 Atherosclerosis of aorta: Secondary | ICD-10-CM | POA: Diagnosis not present

## 2017-08-10 DIAGNOSIS — J9610 Chronic respiratory failure, unspecified whether with hypoxia or hypercapnia: Secondary | ICD-10-CM | POA: Insufficient documentation

## 2017-08-10 DIAGNOSIS — I517 Cardiomegaly: Secondary | ICD-10-CM | POA: Diagnosis not present

## 2017-08-10 DIAGNOSIS — J9611 Chronic respiratory failure with hypoxia: Secondary | ICD-10-CM

## 2017-08-10 DIAGNOSIS — Z7982 Long term (current) use of aspirin: Secondary | ICD-10-CM | POA: Insufficient documentation

## 2017-08-10 DIAGNOSIS — Z83511 Family history of glaucoma: Secondary | ICD-10-CM | POA: Diagnosis not present

## 2017-08-10 DIAGNOSIS — J90 Pleural effusion, not elsewhere classified: Secondary | ICD-10-CM | POA: Diagnosis not present

## 2017-08-10 DIAGNOSIS — Z79899 Other long term (current) drug therapy: Secondary | ICD-10-CM | POA: Diagnosis not present

## 2017-08-10 LAB — COMPREHENSIVE METABOLIC PANEL
ALBUMIN: 3.2 g/dL — AB (ref 3.5–5.0)
ALK PHOS: 166 U/L — AB (ref 38–126)
ALT: 16 U/L (ref 14–54)
ANION GAP: 7 (ref 5–15)
AST: 25 U/L (ref 15–41)
BUN: 12 mg/dL (ref 6–20)
CALCIUM: 8.8 mg/dL — AB (ref 8.9–10.3)
CHLORIDE: 103 mmol/L (ref 101–111)
CO2: 31 mmol/L (ref 22–32)
Creatinine, Ser: 0.72 mg/dL (ref 0.44–1.00)
GFR calc Af Amer: >60 mL/min (ref >60)
GFR calc non Af Amer: >60 mL/min (ref >60)
GLUCOSE: 90 mg/dL (ref 65–99)
Potassium: 3.9 mmol/L (ref 3.5–5.1)
SODIUM: 141 mmol/L (ref 135–145)
Total Bilirubin: 0.6 mg/dL (ref 0.3–1.2)
Total Protein: 6.5 g/dL (ref 6.5–8.1)

## 2017-08-10 NOTE — Patient Instructions (Addendum)
  Labs today (will call for abnormal results, otherwise no news is good news)  Chest xray today  Right Heart Catheterization has been scheduled for you, please see attached instruction sheet.

## 2017-08-10 NOTE — H&P (View-Only) (Signed)
Patient ID: Linda Stevens, female   DOB: December 07, 1945, 71 y.o.   MRN: [id number]  [facility] NOTE  Patient ID: Linda Stevens, female   DOB: 1945-12-16, 71 y.o.   MRN: [id number] Primary Cardiologist: Dr Haroldine Laws  Pulmonary: Dr Melvyn Novas   HPI:  Ms. Trembley is  71 y/o woman with a h/o HTN, COPD (former smoker quit 2015), seasonal allergies, asthma, pulmonary HTN, and diastolic dysfxn.  Admitted 1/23 secondary to progressive DOE, hypoxia, and lower ext edema and has been found to have severe PAH. R/LHC 10/08/15 with normal coronaries, normal CO, and severe PAH PAP 104/49 with 13.6 WU. PAH well out of proportion to left-sided filling pressures and COPD. Started revatio 20 TID 10/08/15 with consideration for macitentan as outpatient. PFTs 10/05/15  FEV1 0.91 (40%)FVC 1.64 (55%), DLCO 8.80 (38%). Auto-immune and infectious serologies negative. VQ negative. Discharge weight 148 pounds.   Returns for routine f/u: Remains on Adcirca 40 and Letairis 10. Since we last saw her has seen Dr. Melvyn Novas several times and found to have large R pleural effusion. Has been drained twice. Underwent first thoracentesis on 11/7.  Fluid seems transudative. CT scan 07/24/17 showed large R effusion and small pericardial effusion. +COPD.  Underwent repeat thoracentesis on 07/29/17 with 310cc out.  Cytology negative. F/u CXR 11/21 showed small bilateral effusions. Very anxious. Still exercising on TM for 20 mins every other day but walks slower. No edema, orthopnea or PND. No syncope. No fevers. Non-productive cough. No syncope. No hot joints or rashes.  Has been referred to Dr. Roxan Hockey in [place]   Echo 5/18 EF 60-65% RV completely normal. No TR to measure RVSP. No effusion   PAH regimen: 1. Adcirca 40 2. Letairis 10 (Switched to [name] as it was thought that sinusitis might be due to macitentan.)  6MW 8/17; 840 ft (256 M) on 2 L O2, sats ranged 83-95%, HR ranged 96-111.  Echo 3/17 EF 60-65% septum still  flattened. RV size improved moderate HK. RVSP ~80. IVC small. Pericardial effusion essentially resolved    VQ scan low prob. CT chest mild COPD. Moderate pericardial effusion. 3v CAD. + cirrhosis. Hepatitis serologies negative.   PFTs  (FEV1 1.23 (52%) with FVC 2.01 (65%) and DLCO 51% in 7/15) PFT's 10/08/2015 FEV1 0.91 (40 %) ratio 56%DLCO 38 % corrects to 63 % for alv volume  PFTs  12/06/15 FEV1 1.0 (44%) FVC 1.74 (58%0 DLCO 28%  R/LHC 10/08/15 with normal coronaries, normal CO, and sever PAH with 13.6 WU.  Findings: Ao = 108/68 (86) LV = 102/10/11 RA = 9 RV = 97/8/12 PA = 104/49 (72) PCW = 18 Fick cardiac output/index = 3.96/2.34 PVR = 13.6 WU FA sat = 93% PA sat = 66%, 69%  RHC 5/18 RA = 2 RV = 48/4 PA = 45/17 (30) PCW = 8 Fick cardiac output/index = 4.9/3.0 PVR = 4.4 WU Ao sat = 94% PA sat = 68%, 70%  ROS: All systems negative except as listed in HPI, PMH and Problem List.  SH:  Social History   Socioeconomic History  . Marital status: Married    Spouse name: Not on file  . Number of children: Not on file  . Years of education: Not on file  . Highest education level: Not on file  Social Needs  . Financial resource strain: Not on file  . Food insecurity - worry: Not on file  . Food insecurity - inability: Not on file  . Transportation needs - medical: Not  on file  . Transportation needs - non-medical: Not on file  Occupational History  . Not on file  Tobacco Use  . Smoking status: Former Smoker    Packs/day: 1.00    Years: 40.00    Pack years: 40.00    Types: Cigarettes    Last attempt to quit: 02/22/2014    Years since quitting: 3.4  . Smokeless tobacco: Never Used  Substance and Sexual Activity  . Alcohol use: No  . Drug use: No  . Sexual activity: No  Other Topics Concern  . Not on file  Social History Narrative   Married with 2 children.  Independent of ADLs.      Does not have a living will.   Would desire CPR but would not want prolonged  life support if futile- husband aware.    FH:  Family History  Problem Relation Age of Onset  . Glaucoma Brother   . Vascular Disease Father        Died of complications from surgery  . Asthma Mother        Died of asthma attack  . Dementia Mother     Past Medical History:  Diagnosis Date  . Acute respiratory failure with hypoxia and hypercapnea 02/26/2014  . Allergic rhinitis due to pollen   . COPD (chronic obstructive pulmonary disease) ([place])   . Diastolic dysfunction    a. 02/2014 Echo: EF 65-70%, Gr 1 DD, RVH;  b. 09/2015 Echo: EF 55%, Gr 1 DD.  . H/O seasonal allergies   . History of tobacco abuse    a. Quit 2015.  [name] Hypertensive heart disease   . Lower extremity edema   . Pericardial effusion    a. 09/2015 Echo: Mod eff w/o tamponade.  . Right heart failure with reduced right ventricular function ([place])    a. 09/2015 Echo: EF 55%, Gr 1 DD, D shaped IV septum, sev dil RV with mod reduced fxn, mod dil RA, RV-RA grad 80mHg, PASP 875mg, mod pericard eff w/o tamponade.  . Severe Pulmonary Hypertension    a. 09/2015 Echo: PASP 8769m.    Current Outpatient Medications  Medication Sig Dispense Refill  . acetaminophen-codeine (TYLENOL #3) 300-30 MG tablet Take 1 tablet 1 hour prior to procedure then 1 tablet every 4 hours as needed 16 tablet 0  . albuterol (PROVENTIL HFA;VENTOLIN HFA) 108 (90 Base) MCG/ACT inhaler Inhale 2 puffs every 6 (six) hours as needed into the lungs for wheezing or shortness of breath. 1 Inhaler 11  . ambrisentan (LETAIRIS) 10 MG tablet Take 1 tablet (10 mg total) by mouth daily. 30 tablet 11  . aspirin EC 81 MG EC tablet Take 1 tablet (81 mg total) by mouth daily. 30 tablet 0  . clotrimazole (MYCELEX) 10 MG troche Take 1 tablet (10 mg total) by mouth 5 (five) times daily. (Patient taking differently: Take 10 mg by mouth 5 (five) times daily as needed (thrush). ) 40 tablet 1  . fluticasone (FLONASE) 50 MCG/ACT nasal spray SHAKE WELL AND INSTILL 2 SPRAYS INTO  EACH NOSTRIL ONCE DAILY 16 g 3  . Fluticasone-Umeclidin-Vilant (TRELEGY ELLIPTA) 100-62.5-25 MCG/INH AEPB Inhale 1 puff daily into the lungs. 60 each 11  . furosemide (LASIX) 20 MG tablet TAKE 1 TABLET BY MOUTH EVERY DAY 30 tablet 3  . ipratropium (ATROVENT) 0.06 % nasal spray Place 2 sprays into both nostrils 2 (two) times daily. 15 mL 12  . OXYGEN 2 to 2lpm 24/7    . potassium chloride (K-DUR)  10 MEQ tablet Take 1 tablet (10 mEq total) by mouth daily. 90 tablet 3  . tadalafil, PAH, (ADCIRCA) 20 MG tablet Take 2 tablets (40 mg total) by mouth daily. 180 tablet 3  . acetaminophen (TYLENOL) 325 MG tablet Take 650 mg every 6 (six) hours as needed by mouth.     No current facility-administered medications for this encounter.     Vitals:   08/10/17 1126  BP: 132/62  Pulse: (!) 105  SpO2: 92%  Weight: 140 lb 3.2 oz (63.6 kg)    PHYSICAL EXAM:  General:  Well appearing. No resp difficulty HEENT: normal Neck: supple. no JVD. Carotids 2+ bilat; no bruits. No lymphadenopathy or thryomegaly appreciated. Cor: PMI nondisplaced. Regular rate & rhythm. No rubs, gallops or murmurs. Lungs: decreased throughout. Very dull on R 1/3 up  Abdomen: soft, nontender, nondistended. No hepatosplenomegaly. No bruits or masses. Good bowel sounds. Extremities: no cyanosis, clubbing, rash, edema Neuro: alert & orientedx3, cranial nerves grossly intact. moves all 4 extremities w/o difficulty. Affect pleasant   ASSESSMENT & PLAN:  1. PAH with right heart failure --suspect combination of WHO Group I and III PAH --symptomatically much improved with diuresis, O2 and Adcirca and letaris. Echo 5/18 with complete resolution of RV strain. No significant TR to estimate RVSP --Will get 6MWon 5/18 230mters - suspect mostly limited by COPD --Repeat RHC 5/18 with very mild PAH (mean PA 30) --With low 6MW need to consider addition of selexipag once we resolve plueral effusion issue  2. Pericardial effusion --resolved  with treatment of HF and PAH 3. Chronic Respiratory failure - On 2 liters oxygen - Follows with Dr. WMelvyn Novas  4. COPD- Gold III  - on inhalers. Follows with Pulmonary. As above 5. Polycythemia suspect due to chronic hypoxia  --Resolved 6. Cirrhosis on CT --due to RHF. Hepatitis serologies are negative 7. Recurrent R pleural effusion --Given improvement in PA pressures doubt effusion related to severe PAH. Macitentan often associated with fluid retention but I have not seen it cause pleural effusions. Will recheck auto-immune labs (but no other stigmata of CTD). Possibly hepatic hydrothorax.  --Will repeat CXR and RHC. May need rheum f/u --Has f/u with Dr. HRoxan Hockeyto discuss possible Pleurex tube. --If hepatic hydrothorax can consider adding spiro  Total time spent 45 minutes. Over half that time spent discussing above.    Daniel Bensimhon,MD 11:46 AM

## 2017-08-10 NOTE — Telephone Encounter (Signed)
Patient is scheduled for RHC with Dr Haroldine Laws on 08/12/17. Cpt code [phone] With patients current insurance- [employer] No pre cert is required.

## 2017-08-10 NOTE — Progress Notes (Signed)
Patient ID: Monda T Mabey, female   DOB: 02/09/1946, 71 y.o.   MRN: [id number]  ADVANCED [facility] CLINIC NOTE  Patient ID: Shykeria T Barrack, female   DOB: 10/27/1945, 71 y.o.   MRN: [id number] Primary Cardiologist: Dr Bensimhon  Pulmonary: Dr Wert   HPI:  Ms. Decesare is  71 y/o woman with a h/o HTN, COPD (former smoker quit 2015), seasonal allergies, asthma, pulmonary HTN, and diastolic dysfxn.  Admitted 1/23 secondary to progressive DOE, hypoxia, and lower ext edema and has been found to have severe PAH. R/LHC 10/08/15 with normal coronaries, normal CO, and severe PAH PAP 104/49 with 13.6 WU. PAH well out of proportion to left-sided filling pressures and COPD. Started revatio 20 TID 10/08/15 with consideration for macitentan as outpatient. PFTs 10/05/15  FEV1 0.91 (40%)FVC 1.64 (55%), DLCO 8.80 (38%). Auto-immune and infectious serologies negative. VQ negative. Discharge weight 148 pounds.   Returns for routine f/u: Remains on Adcirca 40 and Letairis 10. Since we last saw her has seen Dr. Wert several times and found to have large R pleural effusion. Has been drained twice. Underwent first thoracentesis on 11/7.  Fluid seems transudative. CT scan 07/24/17 showed large R effusion and small pericardial effusion. +COPD.  Underwent repeat thoracentesis on 07/29/17 with 310cc out.  Cytology negative. F/u CXR 11/21 showed small bilateral effusions. Very anxious. Still exercising on TM for 20 mins every other day but walks slower. No edema, orthopnea or PND. No syncope. No fevers. Non-productive cough. No syncope. No hot joints or rashes.  Has been referred to Dr. Hendrickson in [place]   Echo 5/18 EF 60-65% RV completely normal. No TR to measure RVSP. No effusion   PAH regimen: 1. Adcirca 40 2. Letairis 10 (Switched to letaitris as it was thought that sinusitis might be due to macitentan.)  6MW 8/17; 840 ft (256 M) on 2 L O2, sats ranged 83-95%, HR ranged 96-111.  Echo 3/17 EF 60-65% septum still  flattened. RV size improved moderate HK. RVSP ~80. IVC small. Pericardial effusion essentially resolved    VQ scan low prob. CT chest mild COPD. Moderate pericardial effusion. 3v CAD. + cirrhosis. Hepatitis serologies negative.   PFTs  (FEV1 1.23 (52%) with FVC 2.01 (65%) and DLCO 51% in 7/15) PFT's 10/08/2015 FEV1 0.91 (40 %) ratio 56%DLCO 38 % corrects to 63 % for alv volume  PFTs  12/06/15 FEV1 1.0 (44%) FVC 1.74 (58%0 DLCO 28%  R/LHC 10/08/15 with normal coronaries, normal CO, and sever PAH with 13.6 WU.  Findings: Ao = 108/68 (86) LV = 102/10/11 RA = 9 RV = 97/8/12 PA = 104/49 (72) PCW = 18 Fick cardiac output/index = 3.96/2.34 PVR = 13.6 WU FA sat = 93% PA sat = 66%, 69%  RHC 5/18 RA = 2 RV = 48/4 PA = 45/17 (30) PCW = 8 Fick cardiac output/index = 4.9/3.0 PVR = 4.4 WU Ao sat = 94% PA sat = 68%, 70%  ROS: All systems negative except as listed in HPI, PMH and Problem List.  SH:  Social History   Socioeconomic History  . Marital status: Married    Spouse name: Not on file  . Number of children: Not on file  . Years of education: Not on file  . Highest education level: Not on file  Social Needs  . Financial resource strain: Not on file  . Food insecurity - worry: Not on file  . Food insecurity - inability: Not on file  . Transportation needs - medical: Not   on file  . Transportation needs - non-medical: Not on file  Occupational History  . Not on file  Tobacco Use  . Smoking status: Former Smoker    Packs/day: 1.00    Years: 40.00    Pack years: 40.00    Types: Cigarettes    Last attempt to quit: 02/22/2014    Years since quitting: 3.4  . Smokeless tobacco: Never Used  Substance and Sexual Activity  . Alcohol use: No  . Drug use: No  . Sexual activity: No  Other Topics Concern  . Not on file  Social History Narrative   Married with 2 children.  Independent of ADLs.      Does not have a living will.   Would desire CPR but would not want prolonged  life support if futile- husband aware.    FH:  Family History  Problem Relation Age of Onset  . Glaucoma Brother   . Vascular Disease Father        Died of complications from surgery  . Asthma Mother        Died of asthma attack  . Dementia Mother     Past Medical History:  Diagnosis Date  . Acute respiratory failure with hypoxia and hypercapnea 02/26/2014  . Allergic rhinitis due to pollen   . COPD (chronic obstructive pulmonary disease) (HCC)   . Diastolic dysfunction    a. 02/2014 Echo: EF 65-70%, Gr 1 DD, RVH;  b. 09/2015 Echo: EF 55%, Gr 1 DD.  . H/O seasonal allergies   . History of tobacco abuse    a. Quit 2015.  . Hypertensive heart disease   . Lower extremity edema   . Pericardial effusion    a. 09/2015 Echo: Mod eff w/o tamponade.  . Right heart failure with reduced right ventricular function (HCC)    a. 09/2015 Echo: EF 55%, Gr 1 DD, D shaped IV septum, sev dil RV with mod reduced fxn, mod dil RA, RV-RA grad 72mmHg, PASP 87mmHg, mod pericard eff w/o tamponade.  . Severe Pulmonary Hypertension    a. 09/2015 Echo: PASP 87mmHg.    Current Outpatient Medications  Medication Sig Dispense Refill  . acetaminophen-codeine (TYLENOL #3) 300-30 MG tablet Take 1 tablet 1 hour prior to procedure then 1 tablet every 4 hours as needed 16 tablet 0  . albuterol (PROVENTIL HFA;VENTOLIN HFA) 108 (90 Base) MCG/ACT inhaler Inhale 2 puffs every 6 (six) hours as needed into the lungs for wheezing or shortness of breath. 1 Inhaler 11  . ambrisentan (LETAIRIS) 10 MG tablet Take 1 tablet (10 mg total) by mouth daily. 30 tablet 11  . aspirin EC 81 MG EC tablet Take 1 tablet (81 mg total) by mouth daily. 30 tablet 0  . clotrimazole (MYCELEX) 10 MG troche Take 1 tablet (10 mg total) by mouth 5 (five) times daily. (Patient taking differently: Take 10 mg by mouth 5 (five) times daily as needed (thrush). ) 40 tablet 1  . fluticasone (FLONASE) 50 MCG/ACT nasal spray SHAKE WELL AND INSTILL 2 SPRAYS INTO  EACH NOSTRIL ONCE DAILY 16 g 3  . Fluticasone-Umeclidin-Vilant (TRELEGY ELLIPTA) 100-62.5-25 MCG/INH AEPB Inhale 1 puff daily into the lungs. 60 each 11  . furosemide (LASIX) 20 MG tablet TAKE 1 TABLET BY MOUTH EVERY DAY 30 tablet 3  . ipratropium (ATROVENT) 0.06 % nasal spray Place 2 sprays into both nostrils 2 (two) times daily. 15 mL 12  . OXYGEN 2 to 2lpm 24/7    . potassium chloride (K-DUR)   10 MEQ tablet Take 1 tablet (10 mEq total) by mouth daily. 90 tablet 3  . tadalafil, PAH, (ADCIRCA) 20 MG tablet Take 2 tablets (40 mg total) by mouth daily. 180 tablet 3  . acetaminophen (TYLENOL) 325 MG tablet Take 650 mg every 6 (six) hours as needed by mouth.     No current facility-administered medications for this encounter.     Vitals:   08/10/17 1126  BP: 132/62  Pulse: (!) 105  SpO2: 92%  Weight: 140 lb 3.2 oz (63.6 kg)    PHYSICAL EXAM:  General:  Well appearing. No resp difficulty HEENT: normal Neck: supple. no JVD. Carotids 2+ bilat; no bruits. No lymphadenopathy or thryomegaly appreciated. Cor: PMI nondisplaced. Regular rate & rhythm. No rubs, gallops or murmurs. Lungs: decreased throughout. Very dull on R 1/3 up  Abdomen: soft, nontender, nondistended. No hepatosplenomegaly. No bruits or masses. Good bowel sounds. Extremities: no cyanosis, clubbing, rash, edema Neuro: alert & orientedx3, cranial nerves grossly intact. moves all 4 extremities w/o difficulty. Affect pleasant   ASSESSMENT & PLAN:  1. PAH with right heart failure --suspect combination of WHO Group I and III PAH --symptomatically much improved with diuresis, O2 and Adcirca and letaris. Echo 5/18 with complete resolution of RV strain. No significant TR to estimate RVSP --Will get 6MWon 5/18 260mters - suspect mostly limited by COPD --Repeat RHC 5/18 with very mild PAH (mean PA 30) --With low 6MW need to consider addition of selexipag once we resolve plueral effusion issue  2. Pericardial effusion --resolved  with treatment of HF and PAH 3. Chronic Respiratory failure - On 2 liters oxygen - Follows with Dr. WMelvyn Novas  4. COPD- Gold III  - on inhalers. Follows with Pulmonary. As above 5. Polycythemia suspect due to chronic hypoxia  --Resolved 6. Cirrhosis on CT --due to RHF. Hepatitis serologies are negative 7. Recurrent R pleural effusion --Given improvement in PA pressures doubt effusion related to severe PAH. Macitentan often associated with fluid retention but I have not seen it cause pleural effusions. Will recheck auto-immune labs (but no other stigmata of CTD). Possibly hepatic hydrothorax.  --Will repeat CXR and RHC. May need rheum f/u --Has f/u with Dr. HRoxan Hockeyto discuss possible Pleurex tube. --If hepatic hydrothorax can consider adding spiro  Total time spent 45 minutes. Over half that time spent discussing above.    Daniel Bensimhon,MD 11:46 AM

## 2017-08-11 ENCOUNTER — Encounter (HOSPITAL_COMMUNITY): Payer: Medicare HMO | Admitting: Internal Medicine

## 2017-08-11 LAB — RHEUMATOID FACTOR: RHEUMATOID FACTOR: 607.5 [IU]/mL — AB (ref 0.0–13.9)

## 2017-08-12 ENCOUNTER — Ambulatory Visit (HOSPITAL_COMMUNITY)
Admission: RE | Admit: 2017-08-12 | Discharge: 2017-08-12 | Disposition: A | Discharge status: Home / Self Care | Payer: Medicare HMO | Source: Ambulatory Visit | Attending: Internal Medicine | Admitting: Internal Medicine

## 2017-08-12 ENCOUNTER — Encounter (HOSPITAL_COMMUNITY): Payer: Self-pay | Admitting: Internal Medicine

## 2017-08-12 ENCOUNTER — Encounter (HOSPITAL_COMMUNITY)
Admission: RE | Disposition: A | Discharge status: Home / Self Care | Payer: Self-pay | Source: Ambulatory Visit | Attending: Internal Medicine

## 2017-08-12 DIAGNOSIS — I2721 Secondary pulmonary arterial hypertension: Secondary | ICD-10-CM | POA: Diagnosis not present

## 2017-08-12 DIAGNOSIS — Z7982 Long term (current) use of aspirin: Secondary | ICD-10-CM | POA: Diagnosis not present

## 2017-08-12 DIAGNOSIS — Z79899 Other long term (current) drug therapy: Secondary | ICD-10-CM | POA: Diagnosis not present

## 2017-08-12 DIAGNOSIS — K7460 Unspecified cirrhosis of liver: Secondary | ICD-10-CM | POA: Insufficient documentation

## 2017-08-12 DIAGNOSIS — I110 Hypertensive heart disease with heart failure: Secondary | ICD-10-CM | POA: Diagnosis not present

## 2017-08-12 DIAGNOSIS — Z9981 Dependence on supplemental oxygen: Secondary | ICD-10-CM | POA: Insufficient documentation

## 2017-08-12 DIAGNOSIS — I5032 Chronic diastolic (congestive) heart failure: Secondary | ICD-10-CM | POA: Diagnosis not present

## 2017-08-12 DIAGNOSIS — K766 Portal hypertension: Secondary | ICD-10-CM | POA: Diagnosis not present

## 2017-08-12 DIAGNOSIS — D751 Secondary polycythemia: Secondary | ICD-10-CM | POA: Diagnosis not present

## 2017-08-12 DIAGNOSIS — I5033 Acute on chronic diastolic (congestive) heart failure: Secondary | ICD-10-CM

## 2017-08-12 DIAGNOSIS — J449 Chronic obstructive pulmonary disease, unspecified: Secondary | ICD-10-CM | POA: Insufficient documentation

## 2017-08-12 DIAGNOSIS — J9610 Chronic respiratory failure, unspecified whether with hypoxia or hypercapnia: Secondary | ICD-10-CM | POA: Diagnosis not present

## 2017-08-12 DIAGNOSIS — Z87891 Personal history of nicotine dependence: Secondary | ICD-10-CM | POA: Insufficient documentation

## 2017-08-12 HISTORY — PX: RIGHT HEART CATH: CATH118263

## 2017-08-12 LAB — POCT I-STAT 3, VENOUS BLOOD GAS (G3P V)
Acid-Base Excess: 5 mmol/L — ABNORMAL HIGH (ref 0.0–2.0)
Acid-Base Excess: 5 mmol/L — ABNORMAL HIGH (ref 0.0–2.0)
Acid-Base Excess: 5 mmol/L — ABNORMAL HIGH (ref 0.0–2.0)
BICARBONATE: 30.7 mmol/L — AB (ref 20.0–28.0)
Bicarbonate: 30.2 mmol/L — ABNORMAL HIGH (ref 20.0–28.0)
Bicarbonate: 31.2 mmol/L — ABNORMAL HIGH (ref 20.0–28.0)
O2 Saturation: 72 %
O2 Saturation: 73 %
O2 Saturation: 76 %
PCO2 VEN: 50.1 mmHg (ref 44.0–60.0)
PCO2 VEN: 50.4 mmHg (ref 44.0–60.0)
PCO2 VEN: 51.6 mmHg (ref 44.0–60.0)
PH VEN: 7.389 (ref 7.250–7.430)
PH VEN: 7.389 (ref 7.250–7.430)
PH VEN: 7.393 (ref 7.250–7.430)
PO2 VEN: 39 mmHg (ref 32.0–45.0)
TCO2: 32 mmol/L (ref 22–32)
TCO2: 32 mmol/L (ref 22–32)
TCO2: 33 mmol/L — ABNORMAL HIGH (ref 22–32)
pO2, Ven: 40 mmHg (ref 32.0–45.0)
pO2, Ven: 42 mmHg (ref 32.0–45.0)

## 2017-08-12 LAB — CBC
HEMATOCRIT: 35.1 % — AB (ref 36.0–46.0)
HEMOGLOBIN: 10.8 g/dL — AB (ref 12.0–15.0)
MCH: 30.3 pg (ref 26.0–34.0)
MCHC: 30.8 g/dL (ref 30.0–36.0)
MCV: 98.6 fL (ref 78.0–100.0)
Platelets: 293 10*3/uL (ref 150–400)
RBC: 3.56 MIL/uL — ABNORMAL LOW (ref 3.87–5.11)
RDW: 15.2 % (ref 11.5–15.5)
WBC: 6.2 10*3/uL (ref 4.0–10.5)

## 2017-08-12 LAB — PROTIME-INR
INR: 1.1
Prothrombin Time: 14.1 seconds (ref 11.4–15.2)

## 2017-08-12 LAB — ANCA TITERS

## 2017-08-12 SURGERY — RIGHT HEART CATH
Anesthesia: LOCAL

## 2017-08-12 MED ORDER — SODIUM CHLORIDE 0.9 % IV SOLN: 250.0000 mL | INTRAVENOUS | Status: DC | PRN

## 2017-08-12 MED ORDER — MIDAZOLAM HCL 2 MG/2ML IJ SOLN
INTRAMUSCULAR | Status: AC
  Filled 2017-08-12: qty 2

## 2017-08-12 MED ORDER — SODIUM CHLORIDE 0.9 % IV SOLN
250.0000 mL | INTRAVENOUS | Status: DC | PRN
Start: 2017-08-12 — End: 2017-08-12

## 2017-08-12 MED ORDER — HEPARIN (PORCINE) IN NACL 2-0.9 UNIT/ML-% IJ SOLN
INTRAMUSCULAR | Status: AC | PRN
  Administered 2017-08-12: 1000 mL

## 2017-08-12 MED ORDER — FENTANYL CITRATE (PF) 100 MCG/2ML IJ SOLN
INTRAMUSCULAR | Status: DC | PRN
  Administered 2017-08-12: 25 ug via INTRAVENOUS

## 2017-08-12 MED ORDER — SODIUM CHLORIDE 0.9% FLUSH: 3.0000 mL | Freq: Two times a day (BID) | INTRAVENOUS | Status: DC

## 2017-08-12 MED ORDER — SODIUM CHLORIDE 0.9% FLUSH: 3.0000 mL | INTRAVENOUS | Status: DC | PRN

## 2017-08-12 MED ORDER — ASPIRIN 81 MG PO CHEW: 81.0000 mg | CHEWABLE_TABLET | ORAL | Status: DC

## 2017-08-12 MED ORDER — MIDAZOLAM HCL 2 MG/2ML IJ SOLN
INTRAMUSCULAR | Status: DC | PRN
  Administered 2017-08-12: 1 mg via INTRAVENOUS

## 2017-08-12 MED ORDER — LIDOCAINE HCL (PF) 1 % IJ SOLN
INTRAMUSCULAR | Status: AC
  Filled 2017-08-12: qty 30

## 2017-08-12 MED ORDER — ONDANSETRON HCL 4 MG/2ML IJ SOLN: 4.0000 mg | Freq: Four times a day (QID) | INTRAMUSCULAR | Status: DC | PRN

## 2017-08-12 MED ORDER — SODIUM CHLORIDE 0.9 % IV SOLN
INTRAVENOUS | Status: DC
  Administered 2017-08-12: 07:00:00 via INTRAVENOUS

## 2017-08-12 MED ORDER — HEPARIN (PORCINE) IN NACL 2-0.9 UNIT/ML-% IJ SOLN
INTRAMUSCULAR | Status: AC
  Filled 2017-08-12: qty 500

## 2017-08-12 MED ORDER — LIDOCAINE HCL (PF) 1 % IJ SOLN
INTRAMUSCULAR | Status: DC | PRN
  Administered 2017-08-12: 2 mL via SUBCUTANEOUS

## 2017-08-12 MED ORDER — ACETAMINOPHEN 325 MG PO TABS: 650.0000 mg | ORAL_TABLET | ORAL | Status: DC | PRN

## 2017-08-12 MED ORDER — FENTANYL CITRATE (PF) 100 MCG/2ML IJ SOLN
INTRAMUSCULAR | Status: AC
  Filled 2017-08-12: qty 2

## 2017-08-12 SURGICAL SUPPLY — 7 items
CATH BALLN WEDGE 5F 110CM (CATHETERS) ×1 IMPLANT
PACK CARDIAC CATHETERIZATION (CUSTOM PROCEDURE TRAY) ×2 IMPLANT
SHEATH GLIDE SLENDER 4/5FR (SHEATH) ×1 IMPLANT
TRANSDUCER W/STOPCOCK (MISCELLANEOUS) ×2 IMPLANT
TUBING ART PRESS 72  MALE/FEM (TUBING) ×1
TUBING ART PRESS 72 MALE/FEM (TUBING) IMPLANT
TUBING CIL FLEX 10 FLL-RA (TUBING) ×2 IMPLANT

## 2017-08-12 NOTE — Discharge Instructions (Signed)
Moderate Conscious Sedation, Adult, Care After These instructions provide you with information about caring for yourself after your procedure. Your health care provider may also give you more specific instructions. Your treatment has been planned according to current medical practices, but problems sometimes occur. Call your health care provider if you have any problems or questions after your procedure. What can I expect after the procedure? After your procedure, it is common:  To feel sleepy for several hours.  To feel clumsy and have poor balance for several hours.  To have poor judgment for several hours.  To vomit if you eat too soon.  Follow these instructions at home: For at least 24 hours after the procedure:   Do not: ? Participate in activities where you could fall or become injured. ? Drive. ? Use heavy machinery. ? Drink alcohol. ? Take sleeping pills or medicines that cause drowsiness. ? Make important decisions or sign legal documents. ? Take care of children on your own.  Rest. Eating and drinking  Follow the diet recommended by your health care provider.  If you vomit: ? Drink water, juice, or soup when you can drink without vomiting. ? Make sure you have little or no nausea before eating solid foods. General instructions  Have a responsible adult stay with you until you are awake and alert.  Take over-the-counter and prescription medicines only as told by your health care provider.  If you smoke, do not smoke without supervision.  Keep all follow-up visits as told by your health care provider. This is important. Contact a health care provider if:  You keep feeling nauseous or you keep vomiting.  You feel light-headed.  You develop a rash.  You have a fever. Get help right away if:  You have trouble breathing. This information is not intended to replace advice given to you by your health care provider. Make sure you discuss any questions you have  with your health care provider. Document Released: 06/15/2013 Document Revised: 01/28/2016 Document Reviewed: 12/15/2015 Elsevier Interactive Patient Education  2018 [place] After This sheet gives you information about how to care for yourself after your procedure. Your health care provider may also give you more specific instructions. If you have problems or questions, contact your health care provider. What can I expect after the procedure? After the procedure, it is common to have:  Bruising or mild discomfort in the area where the IV was inserted (insertion site).  Follow these instructions at home: Eating and drinking  Follow instructions from your health care provider about eating or drinking restrictions.  Drink a lot of fluids for the first several days after the procedure, as directed by your health care provider. This helps to wash (flush) the contrast out of your body. Examples of healthy fluids include water or low-calorie drinks. General instructions  Check your IV insertion area every day for signs of infection. Check for: ? Redness, swelling, or pain. ? Fluid or blood. ? Warmth. ? Pus or a bad smell.  Take over-the-counter and prescription medicines only as told by your health care provider.  Rest and return to your normal activities as told by your health care provider. Ask your health care provider what activities are safe for you.  Do not drive for 24 hours if you were given a medicine to help you relax (sedative), or until your health care provider approves.  Keep all follow-up visits as told by your health care provider. This is important. Contact a  health care provider if:  Your skin becomes itchy or you develop a rash or hives.  You have a fever that does not get better with medicine.  You feel nauseous.  You vomit.  You have redness, swelling, or pain around the insertion site.  You have fluid or blood coming from the insertion  site.  Your insertion area feels warm to the touch.  You have pus or a bad smell coming from the insertion site. Get help right away if:  You have difficulty breathing or shortness of breath.  You develop chest pain.  You faint.  You feel very dizzy. These symptoms may represent a serious problem that is an emergency. Do not wait to see if the symptoms will go away. Get medical help right away. Call your local emergency services (911 in the [place] Do not drive yourself to the hospital. Summary  After your procedure, it is common to have bruising or mild discomfort in the area where the IV was inserted.  You should check your IV insertion area every day for signs of infection.  Take over-the-counter and prescription medicines only as told by your health care provider.  You should drink a lot of fluids for the first several days after the procedure to help flush the contrast from your body. This information is not intended to replace advice given to you by your health care provider. Make sure you discuss any questions you have with your health care provider. Document Released: 06/15/2013 Document Revised: 07/19/2016 Document Reviewed: 07/19/2016 Elsevier Interactive Patient Education  2017 [employer].

## 2017-08-12 NOTE — Interval H&P Note (Signed)
History and Physical Interval Note:  08/12/2017 9:11 AM  Linda Stevens  has presented today for surgery, with the diagnosis of hf  The various methods of treatment have been discussed with the patient and family. After consideration of risks, benefits and other options for treatment, the patient has consented to  Procedure(s): RIGHT HEART CATH (N/A) as a surgical intervention .  The patient's history has been reviewed, patient examined, no change in status, stable for surgery.  I have reviewed the patient's chart and labs.  Questions were answered to the patient's satisfaction.     Daniel Bensimhon

## 2017-08-13 ENCOUNTER — Encounter: Payer: Self-pay | Admitting: Thoracic Surgery (Cardiothoracic Vascular Surgery)

## 2017-08-13 ENCOUNTER — Other Ambulatory Visit: Payer: Self-pay

## 2017-08-13 ENCOUNTER — Institutional Professional Consult (permissible substitution): Payer: Medicare HMO | Admitting: Thoracic Surgery (Cardiothoracic Vascular Surgery)

## 2017-08-13 VITALS — BP 137/64 | HR 99 | Resp 20 | Ht 63.0 in | Wt 140.0 lb

## 2017-08-13 DIAGNOSIS — J90 Pleural effusion, not elsewhere classified: Secondary | ICD-10-CM | POA: Diagnosis not present

## 2017-08-13 NOTE — Progress Notes (Signed)
PCP is Linda Passy, MD Referring Provider is Linda Rockers, MD  Chief Complaint  Patient presents with  . Pleural Effusion    right sided pleural effusion    HPI: [name] is a 71 year old woman with a past medical history significant for tobacco abuse, COPD, pulmonary hypertension, right heart failure, and hypertensive heart disease with diastolic dysfunction.  She is on home oxygen for her COPD.  In late October she developed progressive shortness of breath and back pain.  She was found to have a right pleural effusion.  She had a thoracentesis which drained about 500 mL of fluid but did not completely evacuate the effusion.  This was borderline for transudate versus exudate.  Cytology was negative.  She had a second thoracentesis which only drained about 300 mL of fluid.  A CT was done between the 2 thoracenteses and showed a loculated effusion with some compressive atelectasis.  She had a right heart catheterization yesterday by Dr. Haroldine Stevens.  Her PA pressures were 52/17, RV 56/9 and right atrial pressure was 5.  Her cardiac index was 4.5.  She says that her symptoms have improved from the end of October although she is not 100% back to her baseline prior to that   Past Medical History:  Diagnosis Date  . Acute respiratory failure with hypoxia and hypercapnea 02/26/2014  . Allergic rhinitis due to pollen   . COPD (chronic obstructive pulmonary disease) ([place])   . Diastolic dysfunction    a. 02/2014 Echo: EF 65-70%, Gr 1 DD, RVH;  b. 09/2015 Echo: EF 55%, Gr 1 DD.  . H/O seasonal allergies   . History of tobacco abuse    a. Quit 2015.  [name] Hypertensive heart disease   . Lower extremity edema   . Pericardial effusion    a. 09/2015 Echo: Mod eff w/o tamponade.  . Right heart failure with reduced right ventricular function ([place])    a. 09/2015 Echo: EF 55%, Gr 1 DD, D shaped IV septum, sev dil RV with mod reduced fxn, mod dil RA, RV-RA grad 69mHg, PASP 819mg, mod pericard eff w/o  tamponade.  . Severe Pulmonary Hypertension    a. 09/2015 Echo: PASP 878m.    Past Surgical History:  Procedure Laterality Date  . CARDIAC CATHETERIZATION N/A 10/08/2015   Procedure: Right/Left Heart Cath and Coronary Angiography;  Surgeon: DanJolaine ArtistD;  Location: [facility];  Service: Cardiovascular;  Laterality: N/A;  . CHOLECYSTECTOMY     Open  . RIGHT HEART CATH N/A 01/26/2017   Procedure: Right Heart Cath;  Surgeon: BenJolaine ArtistD;  Location: [facility];  Service: Cardiovascular;  Laterality: N/A;  . RIGHT HEART CATH N/A 08/12/2017   Procedure: RIGHT HEART CATH;  Surgeon: BenJolaine ArtistD;  Location: [facility];  Service: Cardiovascular;  Laterality: N/A;    Family History  Problem Relation Age of Onset  . Glaucoma Brother   . Vascular Disease Father        Died of complications from surgery  . Asthma Mother        Died of asthma attack  . Dementia Mother     Social History Social History   Tobacco Use  . Smoking status: Former Smoker    Packs/day: 1.00    Years: 40.00    Pack years: 40.00    Types: Cigarettes    Last attempt to quit: 02/22/2014    Years since quitting: 3.4  . Smokeless  tobacco: Never Used  Substance Use Topics  . Alcohol use: No  . Drug use: No    Current Outpatient Medications  Medication Sig Dispense Refill  . acetaminophen (TYLENOL) 325 MG tablet Take 325 mg by mouth at bedtime as needed for moderate pain or headache.     [name] acetaminophen-codeine (TYLENOL #3) 300-30 MG tablet Take 1 tablet 1 hour prior to procedure then 1 tablet every 4 hours as needed 16 tablet 0  . albuterol (PROVENTIL HFA;VENTOLIN HFA) 108 (90 Base) MCG/ACT inhaler Inhale 2 puffs every 6 (six) hours as needed into the lungs for wheezing or shortness of breath. 1 Inhaler 11  . ambrisentan (LETAIRIS) 10 MG tablet Take 1 tablet (10 mg total) by mouth daily. 30 tablet 11  . aspirin EC 81 MG EC tablet Take 1 tablet (81 mg total)  by mouth daily. 30 tablet 0  . clotrimazole (MYCELEX) 10 MG troche Take 1 tablet (10 mg total) by mouth 5 (five) times daily. (Patient taking differently: Take 10 mg by mouth 5 (five) times daily as needed (thrush). ) 40 tablet 1  . fluticasone (FLONASE) 50 MCG/ACT nasal spray SHAKE WELL AND INSTILL 2 SPRAYS INTO EACH NOSTRIL ONCE DAILY (Patient taking differently: SHAKE WELL AND INSTILL 2 SPRAYS INTO EACH NOSTRIL ONCE DAILY, MAY INSTILL 2 SPRAYS A SECOND TIME AS NEEDED FOR ALLERGIES) 16 g 3  . Fluticasone-Umeclidin-Vilant (TRELEGY ELLIPTA) 100-62.5-25 MCG/INH AEPB Inhale 1 puff daily into the lungs. 60 each 11  . furosemide (LASIX) 20 MG tablet TAKE 1 TABLET BY MOUTH EVERY DAY 30 tablet 3  . ipratropium (ATROVENT) 0.06 % nasal spray Place 2 sprays into both nostrils 2 (two) times daily. 15 mL 12  . OXYGEN 2 to 2lpm 24/7    . Polyethyl Glycol-Propyl Glycol (SYSTANE OP) Apply 1-2 drops to eye daily as needed (dry eyes).    . potassium chloride (K-DUR) 10 MEQ tablet Take 1 tablet (10 mEq total) by mouth daily. 90 tablet 3  . tadalafil, PAH, (ADCIRCA) 20 MG tablet Take 2 tablets (40 mg total) by mouth daily. 180 tablet 3   No current facility-administered medications for this visit.     Allergies  Allergen Reactions  . Amoxicillin-Pot Clavulanate     REACTION: gi upset Has patient had a PCN reaction causing immediate rash, facial/tongue/throat swelling, SOB or lightheadedness with hypotension: No Has patient had a PCN reaction causing severe rash involving mucus membranes or skin necrosis: No Has patient had a PCN reaction that required hospitalization : No Has patient had a PCN reaction occurring within the last 10 years: No If all of the above answers are "NO", then may proceed with Cephalosporin use.   . Cefdinir     REACTION: gi  upset  . Moxifloxacin     REACTION: rash  . Singulair [Montelukast Sodium]     Itching     Review of Systems  Constitutional: Negative for chills, fever  and unexpected weight change.  HENT: Negative for trouble swallowing and voice change.   Respiratory: Positive for shortness of breath.   Cardiovascular: Negative for chest pain and leg swelling.  Gastrointestinal: Negative for abdominal distention and abdominal pain.  Genitourinary: Negative for dysuria and flank pain.  Musculoskeletal: Positive for arthralgias, back pain and joint swelling.  Neurological: Negative for dizziness, syncope and weakness.  All other systems reviewed and are negative.   BP 137/64 (BP Location: Right Arm, Patient Position: Sitting, Cuff Size: Normal)   Pulse 99   Resp 20  Ht _0  (1.6 m)   Wt 140 lb (63.5 kg)   SpO2 92% Comment: on 3L [place]  BMI 24.80 kg/m  Physical Exam  Constitutional: She is oriented to person, place, and time. She appears well-developed and well-nourished. No distress.  HENT:  Head: Normocephalic and atraumatic.  Mouth/Throat: No oropharyngeal exudate.  Nasal cannula O2 in place  Eyes: Conjunctivae and EOM are normal. No scleral icterus.  Neck: Neck supple. No thyromegaly present.  Cardiovascular: Normal rate, regular rhythm and intact distal pulses. Exam reveals no gallop and no friction rub.  No murmur heard. Pulmonary/Chest: Effort normal. No respiratory distress. She has no wheezes. She has no rales.  Slightly diminished breath sounds right base  Abdominal: Soft. She exhibits no distension. There is no tenderness.  Musculoskeletal: She exhibits no edema.  Lymphadenopathy:    She has no cervical adenopathy.  Neurological: She is alert and oriented to person, place, and time. No cranial nerve deficit.  Motor grossly intact  Skin: Skin is warm and dry.  Vitals reviewed.    Diagnostic Tests: CT CHEST WITH CONTRAST  TECHNIQUE: Multidetector CT imaging of the chest was performed during intravenous contrast administration.  CONTRAST:  66m ISOVUE-300 IOPAMIDOL (ISOVUE-300) INJECTION 61%  COMPARISON:  07/17/2017,  10/05/2015  FINDINGS: Cardiovascular: Atherosclerotic calcifications are noted of the thoracic aorta and its branches. Coronary calcifications are seen. No aneurysmal dilatation or dissection is seen. Heart is mildly enlarged in size. The pulmonary artery shows no large central pulmonary embolus although timing was not performed for arterial evaluation. Mild pericardial effusion is noted.  Mediastinum/Nodes: Thoracic inlet is within normal limits. Scattered hilar and mediastinal lymph nodes are noted. subcarinal node is noted measuring 13 mm in short axis. Precarinal node is noted measuring 17 mm in short axis. The right hilar node is small measuring 8 mm. Few scattered lymph nodes are noted which show calcification consistent with prior granulomatous disease.  Lungs/Pleura: A large right-sided pleural effusion is noted. No findings to suggest significant loculation are seen. Diffuse emphysematous changes are identified throughout both lungs. Right lower lobe atelectatic changes are seen similar to that noted on prior plain film examination. The left lung is well aerated without focal infiltrate or sizable effusion. No specific nodular changes are seen.  Upper Abdomen: Visualized upper abdomen shows no focal abnormality.  Musculoskeletal: Degenerative changes of the thoracic spine are noted. No compression deformities are noted.  IMPRESSION: Large right-sided pleural effusion with associated lower lobe atelectatic changes similar to that seen on prior plain film examination.  COPD.  Changes of prior granulomatous disease.  Scattered mildly prominent mediastinal lymph nodes likely reactive in nature. Some of these are stable from 2017.  Pericardial effusion stable from 2017.  Aortic Atherosclerosis (ICD10-I70.0) and Emphysema (ICD10-J43.9).   Electronically Signed   By: MInez CatalinaM.D.   On: 07/24/2017 18:15 CHEST  2 VIEW  COMPARISON:  07/29/2017  CXR, chest CT 07/24/2016.  FINDINGS: Stable cardiomegaly with aortic atherosclerosis. Re- demonstration of bilateral pleural effusions, small to moderate on the right and trace on the left. Atelectasis and/or scarring is noted at each lung base. Some of the fluid appears loculated as before. Mild pulmonary vascular congestion is noted. No acute osseous abnormality.  IMPRESSION: 1. Stable cardiomegaly with aortic atherosclerosis. 2. Small to moderate right pleural effusion and trace left pleural effusion. No significant change from recent comparison radiograph.   Electronically Signed   By: DAshley RoyaltyM.D.   On: 08/10/2017 15:25 I personally reviewed the  CT and chest x-ray images and concur with the findings noted above  Thoracentesis 07/29/2017 Albumin 1.7 Glucose 83 Total protein 3.2 (serum 6.9) 1,219 white blood cells, 85% lymphocytes, 15% monocytes  Cytology negative  Impression: 71 year old woman with history of tobacco abuse, COPD, pulmonary arterial hypertension, hypertensive heart disease with diastolic dysfunction, chronic right heart failure improved with treatment, and cirrhosis.  She presented with shortness of breath and low back pain at the end of October and was found to have a loculated right pleural effusion.  She has had 2 thoracenteses on that and both times had symptomatic improvement.  She did not have complete resolution of the effusion with either thoracentesis.  Her CT scan shows a small to moderate loculated pleural effusion at the right base with some atelectasis.  Her most recent chest x-ray shows some persistent loculated fluid but is significantly improved from her initial x-ray in October.  The fluid is on the borderline between an exudate and transudate.  The protein ratio is less than 0.5, but the fluid does have some loculations.  I discussed several options with Linda Stevens and her family.  One is continued medical therapy.  Second is repeated  attempts at thoracentesis, but I think this would be unlikely to be successful given the loculated nature of the effusion.  Third would be to place a drain percutaneously and then treated with thrombolytics.  That would require hospitalization.  The final option would be surgical intervention with VATS to drain the fluid in place chest tubes. I do not think a pleural catheter will be effective because of the loculated nature of the effusion.  I spoke with Dr. Haroldine Stevens earlier today.  He feels that she could still benefit from additional diuretics, possibly Spironolactone.  She is not at all interested in having a drain placed or having a VATS at this point.    Plan: Follow up with Dr. Haroldine Stevens and Dr. Melvyn Novas.  I would be happy to see her back anytime if surgery is required.  Melrose Nakayama, MD [facility] [phone]

## 2017-08-14 ENCOUNTER — Ambulatory Visit: Payer: Medicare HMO | Admitting: Internal Medicine

## 2017-08-14 ENCOUNTER — Telehealth (HOSPITAL_COMMUNITY): Payer: Self-pay | Admitting: Cardiology

## 2017-08-14 NOTE — Telephone Encounter (Signed)
Patient called to report hand pain  AND swelling. Reports the area of concern is NOT at the cath site im the armpit, but more so in the hand where the tape was wrapped.   Reports bandage/wrapping was too tight. ?iv site Reports she did take some tylenol with relief Denies fever/chills, however she is unable to move her hand  Advised to follow up with PCP (patient is reluctant as she has not re-established with new provider), continue to take tylenol as prescribed on the bottle, and she can also try alternating heat/ice therapy. If she is not feeling better by Monday she can call back, if she feels worse she should report to urgent care/[facility] over the weekend  Will forward to RN for co-sign

## 2017-08-14 NOTE — Telephone Encounter (Signed)
Appropriate, no further recommendations.  If no better should report to ED to r/o thrombus.  Renee Pain, RN

## 2017-08-25 ENCOUNTER — Telehealth (HOSPITAL_COMMUNITY): Payer: Self-pay | Admitting: Pharmacist

## 2017-08-25 NOTE — Telephone Encounter (Signed)
Tadalafil PA approved by [employer] Part [employer] through 09/07/18.   Ruta Hinds. [name], PharmD, BCPS, CPP Clinical Pharmacist Pager: [phone] Phone: [phone] 08/25/2017 3:42 PM

## 2017-08-27 ENCOUNTER — Telehealth (HOSPITAL_COMMUNITY): Payer: Self-pay | Admitting: *Deleted

## 2017-08-27 NOTE — Telephone Encounter (Signed)
Notes recorded by Scarlette Calico, RN on 08/26/2017 at 5:09 PM EST Pt aware, referral placed to Dr Amil Amen ------  Notes recorded by Kerry Dory, CMA on 08/12/2017 at 3:46 PM EST Left message for patient to call back.  [phone] (H) ------  Notes recorded by Jolaine Artist, MD on 08/11/2017 at 12:08 PM EST Rheumatoid factor is up. Suspect may be nonspecific. But with recurrent pleural effusion can you please refer to Dr. Amil Amen in Rheumatology? Thanks

## 2017-08-27 NOTE — Telephone Encounter (Signed)
-----  Message from Jolaine Artist, MD sent at 08/11/2017 12:08 PM EST ----- Rheumatoid factor is up. Suspect may be nonspecific. But with recurrent pleural effusion can you please refer to Dr. Amil Amen in Rheumatology? Thanks

## 2017-08-29 ENCOUNTER — Other Ambulatory Visit (HOSPITAL_COMMUNITY): Payer: Self-pay | Admitting: Internal Medicine

## 2017-09-07 DIAGNOSIS — J449 Chronic obstructive pulmonary disease, unspecified: Secondary | ICD-10-CM | POA: Diagnosis not present

## 2017-09-07 DIAGNOSIS — J45909 Unspecified asthma, uncomplicated: Secondary | ICD-10-CM | POA: Diagnosis not present

## 2017-09-07 DIAGNOSIS — R0602 Shortness of breath: Secondary | ICD-10-CM | POA: Diagnosis not present

## 2017-09-07 DIAGNOSIS — J188 Other pneumonia, unspecified organism: Secondary | ICD-10-CM | POA: Diagnosis not present

## 2017-09-07 DIAGNOSIS — R05 Cough: Secondary | ICD-10-CM | POA: Diagnosis not present

## 2017-09-07 DIAGNOSIS — J9601 Acute respiratory failure with hypoxia: Secondary | ICD-10-CM | POA: Diagnosis not present

## 2017-09-07 DIAGNOSIS — R062 Wheezing: Secondary | ICD-10-CM | POA: Diagnosis not present

## 2017-09-07 DIAGNOSIS — I10 Essential (primary) hypertension: Secondary | ICD-10-CM | POA: Diagnosis not present

## 2017-09-07 DIAGNOSIS — J45901 Unspecified asthma with (acute) exacerbation: Secondary | ICD-10-CM | POA: Diagnosis not present

## 2017-09-15 ENCOUNTER — Other Ambulatory Visit: Payer: Self-pay | Admitting: Internal Medicine

## 2017-09-15 ENCOUNTER — Telehealth (HOSPITAL_COMMUNITY): Payer: Self-pay | Admitting: Cardiology

## 2017-09-15 DIAGNOSIS — I3139 Other pericardial effusion (noninflammatory): Secondary | ICD-10-CM

## 2017-09-15 DIAGNOSIS — I313 Pericardial effusion (noninflammatory): Secondary | ICD-10-CM

## 2017-09-15 MED ORDER — FLUTICASONE PROPIONATE 50 MCG/ACT NA SUSP: NASAL | 1 refills | Status: DC

## 2017-09-15 NOTE — Telephone Encounter (Signed)
Patient called to report rheumatology appointment was given today however her appt is not until 10/15/17  Patient reports she is not able to wait that long to be seen for further evaluation. Reports she has severe SOB and back pain. Reports is Dr Haroldine Laws would call Dr Valora Piccolo office directly her appt could be move up to a urgent work in.  Above discussed with Dr Haroldine Laws.  Would like to get CXR as patient has had plural effusions in the past with the same symptoms.   Patient aware

## 2017-09-16 ENCOUNTER — Ambulatory Visit (HOSPITAL_COMMUNITY)
Admission: RE | Admit: 2017-09-16 | Discharge: 2017-09-16 | Disposition: A | Discharge status: Home / Self Care | Payer: Medicare HMO | Source: Ambulatory Visit | Attending: Internal Medicine | Admitting: Internal Medicine

## 2017-09-16 DIAGNOSIS — J90 Pleural effusion, not elsewhere classified: Secondary | ICD-10-CM | POA: Insufficient documentation

## 2017-09-16 DIAGNOSIS — R918 Other nonspecific abnormal finding of lung field: Secondary | ICD-10-CM | POA: Diagnosis not present

## 2017-09-16 DIAGNOSIS — I313 Pericardial effusion (noninflammatory): Secondary | ICD-10-CM

## 2017-09-16 DIAGNOSIS — I3139 Other pericardial effusion (noninflammatory): Secondary | ICD-10-CM

## 2017-09-16 DIAGNOSIS — R0602 Shortness of breath: Secondary | ICD-10-CM | POA: Diagnosis not present

## 2017-09-17 ENCOUNTER — Telehealth (HOSPITAL_COMMUNITY): Payer: Self-pay | Admitting: *Deleted

## 2017-09-17 DIAGNOSIS — I5032 Chronic diastolic (congestive) heart failure: Secondary | ICD-10-CM

## 2017-09-17 MED ORDER — FUROSEMIDE 40 MG PO TABS: 40.0000 mg | ORAL_TABLET | Freq: Every day | ORAL | 3 refills | Status: DC

## 2017-09-17 NOTE — Telephone Encounter (Signed)
Notes recorded by Scarlette Calico, RN on 09/17/2017 at 11:26 AM EST Spoke w/pt, she is aware and agreeable, bmet sch for 09/24/17, rescheduled appt w/Dr Bensimhon from March to 10/22/17, new rx for lasix sent in

## 2017-09-17 NOTE — Telephone Encounter (Signed)
-----  Message from Jolaine Artist, MD sent at 09/16/2017  9:24 PM EST ----- Double lasix to 40 daily. Take 40 bid the first day. When do I see her next? Thanks

## 2017-09-24 ENCOUNTER — Ambulatory Visit (HOSPITAL_COMMUNITY)
Admission: RE | Admit: 2017-09-24 | Discharge: 2017-09-24 | Disposition: A | Discharge status: Home / Self Care | Payer: Medicare HMO | Source: Ambulatory Visit | Attending: Cardiology | Admitting: Cardiology

## 2017-09-24 DIAGNOSIS — I5032 Chronic diastolic (congestive) heart failure: Secondary | ICD-10-CM | POA: Diagnosis not present

## 2017-09-24 LAB — BASIC METABOLIC PANEL
Anion gap: 10 (ref 5–15)
BUN: 12 mg/dL (ref 6–20)
CO2: 32 mmol/L (ref 22–32)
CREATININE: 0.76 mg/dL (ref 0.44–1.00)
Calcium: 8.8 mg/dL — ABNORMAL LOW (ref 8.9–10.3)
Chloride: 97 mmol/L — ABNORMAL LOW (ref 101–111)
Glucose, Bld: 105 mg/dL — ABNORMAL HIGH (ref 65–99)
POTASSIUM: 4.1 mmol/L (ref 3.5–5.1)
SODIUM: 139 mmol/L (ref 135–145)

## 2017-09-24 NOTE — Addendum Note (Signed)
Encounter addended by: Scarlette Calico, RN on: 09/24/2017 12:10 PM  Actions taken: Vitals modified, Sign clinical note

## 2017-09-24 NOTE — Progress Notes (Signed)
Pt request samples of Letairis, samples provided  Medication Samples have been provided to the patient.  Drug name: Letairis       Strength: 30m        Qty: 4  LOT:: [id number] Exp.Date: 3/21  Dosing instructions: take 1 tab daily  The patient has been instructed regarding the correct time, dose, and frequency of taking this medication, including desired effects and most common side effects.   Skylee Baird 12:20 PM 09/24/2017

## 2017-09-24 NOTE — Progress Notes (Signed)
Pt in for labs today and c/o increased LE edema.  VS stable.  Pt reports she did increase Lasix last week as instructed, edema improved at first but has returned, she states SOB has not changed.  O2 sats were 84% on 3L pulse oxygen, after pt took several deep breathes in nose and out her mouth sats improved to 90-92%.  Pt was also examined by Darrick Grinder, NP she recommends pt increase Lasix to 40 mg BID and f/u with Dr Haroldine Laws soon.  Pt is aware and agreeable, appt sch to see Dr Haroldine Laws Monday 09/28/17

## 2017-09-24 NOTE — Patient Instructions (Signed)
Increase Furosemide to 40 mg Twice daily until you are seen on Monday  Your physician recommends that you schedule a follow-up appointment in: Monday 09/28/17 at 3:20 PM

## 2017-09-24 NOTE — Addendum Note (Signed)
Encounter addended by: Scarlette Calico, RN on: 09/24/2017 12:21 PM  Actions taken: Sign clinical note

## 2017-09-27 ENCOUNTER — Other Ambulatory Visit (HOSPITAL_COMMUNITY): Payer: Self-pay | Admitting: Internal Medicine

## 2017-09-28 ENCOUNTER — Encounter (HOSPITAL_COMMUNITY): Payer: Self-pay | Admitting: Internal Medicine

## 2017-09-28 ENCOUNTER — Encounter (HOSPITAL_COMMUNITY): Payer: Medicare HMO | Admitting: Internal Medicine

## 2017-09-28 ENCOUNTER — Ambulatory Visit (HOSPITAL_COMMUNITY)
Admission: RE | Admit: 2017-09-28 | Discharge: 2017-09-28 | Disposition: A | Discharge status: Home / Self Care | Payer: Medicare HMO | Source: Ambulatory Visit | Attending: Internal Medicine | Admitting: Internal Medicine

## 2017-09-28 ENCOUNTER — Other Ambulatory Visit: Payer: Self-pay

## 2017-09-28 VITALS — BP 137/50 | HR 99 | Wt 136.0 lb

## 2017-09-28 DIAGNOSIS — Z87891 Personal history of nicotine dependence: Secondary | ICD-10-CM | POA: Insufficient documentation

## 2017-09-28 DIAGNOSIS — Z7982 Long term (current) use of aspirin: Secondary | ICD-10-CM | POA: Diagnosis not present

## 2017-09-28 DIAGNOSIS — J90 Pleural effusion, not elsewhere classified: Secondary | ICD-10-CM

## 2017-09-28 DIAGNOSIS — I2510 Atherosclerotic heart disease of native coronary artery without angina pectoris: Secondary | ICD-10-CM | POA: Insufficient documentation

## 2017-09-28 DIAGNOSIS — K7460 Unspecified cirrhosis of liver: Secondary | ICD-10-CM | POA: Diagnosis not present

## 2017-09-28 DIAGNOSIS — R05 Cough: Secondary | ICD-10-CM | POA: Diagnosis not present

## 2017-09-28 DIAGNOSIS — I5032 Chronic diastolic (congestive) heart failure: Secondary | ICD-10-CM | POA: Diagnosis not present

## 2017-09-28 DIAGNOSIS — Z79899 Other long term (current) drug therapy: Secondary | ICD-10-CM | POA: Insufficient documentation

## 2017-09-28 DIAGNOSIS — I272 Pulmonary hypertension, unspecified: Secondary | ICD-10-CM | POA: Diagnosis not present

## 2017-09-28 DIAGNOSIS — J449 Chronic obstructive pulmonary disease, unspecified: Secondary | ICD-10-CM | POA: Insufficient documentation

## 2017-09-28 DIAGNOSIS — J9610 Chronic respiratory failure, unspecified whether with hypoxia or hypercapnia: Secondary | ICD-10-CM | POA: Diagnosis not present

## 2017-09-28 DIAGNOSIS — I509 Heart failure, unspecified: Secondary | ICD-10-CM

## 2017-09-28 DIAGNOSIS — E8770 Fluid overload, unspecified: Secondary | ICD-10-CM | POA: Insufficient documentation

## 2017-09-28 DIAGNOSIS — I2729 Other secondary pulmonary hypertension: Secondary | ICD-10-CM | POA: Diagnosis not present

## 2017-09-28 DIAGNOSIS — I50810 Right heart failure, unspecified: Secondary | ICD-10-CM | POA: Diagnosis present

## 2017-09-28 DIAGNOSIS — I110 Hypertensive heart disease with heart failure: Secondary | ICD-10-CM | POA: Insufficient documentation

## 2017-09-28 DIAGNOSIS — I2721 Secondary pulmonary arterial hypertension: Secondary | ICD-10-CM

## 2017-09-28 DIAGNOSIS — D751 Secondary polycythemia: Secondary | ICD-10-CM | POA: Diagnosis not present

## 2017-09-28 LAB — LACTATE DEHYDROGENASE: LDH: 148 U/L (ref 98–192)

## 2017-09-28 MED ORDER — FUROSEMIDE 40 MG PO TABS: 40.0000 mg | ORAL_TABLET | Freq: Two times a day (BID) | ORAL | 3 refills | Status: DC

## 2017-09-28 MED ORDER — SPIRONOLACTONE 25 MG PO TABS: 12.5000 mg | ORAL_TABLET | Freq: Every day | ORAL | 3 refills | Status: DC

## 2017-09-28 NOTE — Progress Notes (Signed)
Patient ID: LEONI GOODNESS, female   DOB: [birth year], 72 y.o.   MRN: [id number]  ADVANCED HF CLINIC NOTE  Patient ID: COLLYN SELK, female   DOB: [birth year], 72 y.o.   MRN: [id number] Primary Cardiologist: Dr Haroldine Laws  Pulmonary: Dr Melvyn Novas   HPI:  Ms. Sobecki is  72 y/o woman with a h/o HTN, COPD (former smoker quit 2015), seasonal allergies, asthma, pulmonary HTN, and diastolic dysfxn.  Admitted 1/23 secondary to progressive DOE, hypoxia, and lower ext edema and has been found to have severe PAH. R/LHC 10/08/15 with normal coronaries, normal CO, and severe PAH PAP 104/49 with 13.6 WU. PAH well out of proportion to left-sided filling pressures and COPD. Started revatio 20 TID 10/08/15 with consideration for macitentan as outpatient. PFTs 10/05/15  FEV1 0.91 (40%)FVC 1.64 (55%), DLCO 8.80 (38%). Auto-immune and infectious serologies negative. VQ negative. Discharge weight 148 pounds.   Returns for routine f/u: Remains on Adcirca 40 and Letairis 10. Recently found to have large R pleural effusion. Has been drained twice. Underwent first thoracentesis on 11/7.  Fluid seems transudative. CT scan 07/24/17 showed large R effusion and small pericardial effusion. +COPD.  Underwent repeat thoracentesis on 07/29/17 with 310cc out.  Cytology negative. F/u CXR 11/21 showed small bilateral effusions. Was on lasix 40 daily. On Friday doubled to 40bid and has lost 4 pounds over the weekend. Feels weak. 430-5p every afternoon getting chills.   + orthopnea or PND. No hot joints or rash.    Very anxious. Still exercising on TM for 20 mins every other day but walks slower. No edema, orthopnea or PND. No syncope. No fevers. Non-productive cough. No syncope. No hot joints or rashes.  Has been referred to Dr. Roxan Hockey in [place]   Echo 5/18 EF 60-65% RV completely normal. No TR to measure RVSP. No effusion   PAH regimen: 1. Adcirca 40 2. Letairis 10 (Switched to [name] as it was thought that sinusitis  might be due to macitentan.)  6MW 8/17; 840 ft (256 M) on 2 L O2, sats ranged 83-95%, HR ranged 96-111.  Echo 3/17 EF 60-65% septum still flattened. RV size improved moderate HK. RVSP ~80. IVC small. Pericardial effusion essentially resolved    RHC 12/18 RA = 5 RV = 56/9 PA = 52/17 (33) PCW = 10 Fick cardiac output/index = 7.5/4.5 PVR = 3.5 WU Ao sat = 96% PA sat = 73%, 72% High SVC sat = 76%  VQ scan low prob. CT chest mild COPD. Moderate pericardial effusion. 3v CAD. + cirrhosis. Hepatitis serologies negative.   PFTs  (FEV1 1.23 (52%) with FVC 2.01 (65%) and DLCO 51% in 7/15) PFT's 10/08/2015 FEV1 0.91 (40 %) ratio 56%DLCO 38 % corrects to 63 % for alv volume  PFTs  12/06/15 FEV1 1.0 (44%) FVC 1.74 (58%0 DLCO 28%  R/LHC 10/08/15 with normal coronaries, normal CO, and sever PAH with 13.6 WU.  Findings: Ao = 108/68 (86) LV = 102/10/11 RA = 9 RV = 97/8/12 PA = 104/49 (72) PCW = 18 Fick cardiac output/index = 3.96/2.34 PVR = 13.6 WU FA sat = 93% PA sat = 66%, 69%  RHC 5/18 RA = 2 RV = 48/4 PA = 45/17 (30) PCW = 8 Fick cardiac output/index = 4.9/3.0 PVR = 4.4 WU Ao sat = 94% PA sat = 68%, 70%  ROS: All systems negative except as listed in HPI, PMH and Problem List.  SH:  Social History   Socioeconomic History  . Marital status: Married  Spouse name: Not on file  . Number of children: Not on file  . Years of education: Not on file  . Highest education level: Not on file  Social Needs  . Financial resource strain: Not on file  . Food insecurity - worry: Not on file  . Food insecurity - inability: Not on file  . Transportation needs - medical: Not on file  . Transportation needs - non-medical: Not on file  Occupational History  . Not on file  Tobacco Use  . Smoking status: Former Smoker    Packs/day: 1.00    Years: 40.00    Pack years: 40.00    Types: Cigarettes    Last attempt to quit: 02/22/2014    Years since quitting: 3.6  . Smokeless tobacco:  Never Used  Substance and Sexual Activity  . Alcohol use: No  . Drug use: No  . Sexual activity: No  Other Topics Concern  . Not on file  Social History Narrative   Married with 2 children.  Independent of ADLs.      Does not have a living will.   Would desire CPR but would not want prolonged life support if futile- husband aware.    FH:  Family History  Problem Relation Age of Onset  . Glaucoma Brother   . Vascular Disease Father        Died of complications from surgery  . Asthma Mother        Died of asthma attack  . Dementia Mother     Past Medical History:  Diagnosis Date  . Acute respiratory failure with hypoxia and hypercapnea 02/26/2014  . Allergic rhinitis due to pollen   . COPD (chronic obstructive pulmonary disease) ([place])   . Diastolic dysfunction    a. 02/2014 Echo: EF 65-70%, Gr 1 DD, RVH;  b. 09/2015 Echo: EF 55%, Gr 1 DD.  . H/O seasonal allergies   . History of tobacco abuse    a. Quit 2015.  [name] Hypertensive heart disease   . Lower extremity edema   . Pericardial effusion    a. 09/2015 Echo: Mod eff w/o tamponade.  . Right heart failure with reduced right ventricular function ([place])    a. 09/2015 Echo: EF 55%, Gr 1 DD, D shaped IV septum, sev dil RV with mod reduced fxn, mod dil RA, RV-RA grad 65mHg, PASP 844mg, mod pericard eff w/o tamponade.  . Severe Pulmonary Hypertension    a. 09/2015 Echo: PASP 8737m.    Current Outpatient Medications  Medication Sig Dispense Refill  . acetaminophen (TYLENOL) 325 MG tablet Take 325 mg by mouth at bedtime as needed for moderate pain or headache.     . aMarland Kitchenetaminophen-codeine (TYLENOL #3) 300-30 MG tablet Take 1 tablet 1 hour prior to procedure then 1 tablet every 4 hours as needed 16 tablet 0  . albuterol (PROVENTIL HFA;VENTOLIN HFA) 108 (90 Base) MCG/ACT inhaler Inhale 2 puffs every 6 (six) hours as needed into the lungs for wheezing or shortness of breath. 1 Inhaler 11  . ambrisentan (LETAIRIS) 10 MG tablet Take 1  tablet (10 mg total) by mouth daily. 30 tablet 11  . aspirin EC 81 MG EC tablet Take 1 tablet (81 mg total) by mouth daily. 30 tablet 0  . clotrimazole (MYCELEX) 10 MG troche Take 1 tablet (10 mg total) by mouth 5 (five) times daily. 40 tablet 1  . fluticasone (FLONASE) 50 MCG/ACT nasal spray SHAKE WELL AND INSTILL 2 SPRAYS INTO EACH NOSTRIL ONCE DAILY  48 g 1  . Fluticasone-Umeclidin-Vilant (TRELEGY ELLIPTA) 100-62.5-25 MCG/INH AEPB Inhale 1 puff daily into the lungs. 60 each 11  . furosemide (LASIX) 40 MG tablet Take 1 tablet (40 mg total) by mouth daily. 30 tablet 3  . ipratropium (ATROVENT) 0.06 % nasal spray Place 2 sprays into both nostrils 2 (two) times daily. 15 mL 12  . OXYGEN 2 to 2lpm 24/7    . Polyethyl Glycol-Propyl Glycol (SYSTANE OP) Apply 1-2 drops to eye daily as needed (dry eyes).    . potassium chloride (K-DUR) 10 MEQ tablet Take 1 tablet (10 mEq total) by mouth daily. 90 tablet 3  . tadalafil, PAH, (ADCIRCA) 20 MG tablet Take 2 tablets (40 mg total) by mouth daily. 180 tablet 3   No current facility-administered medications for this encounter.     Vitals:   09/28/17 1207  BP: (!) 137/50  Pulse: 99  SpO2: 90%  Weight: 136 lb (61.7 kg)    PHYSICAL EXAM: General:  Sitting in WC  No resp difficulty HEENT: normal Neck: supple. JVP 8-9  Carotids 2+ bilat; no bruits. No lymphadenopathy or thryomegaly appreciated. Cor: PMI nondisplaced. Regular rate & rhythm. +RV lift 2/6 TR. P2 loud Lungs: clear decreased 1/3 up on right dull at left base Abdomen: soft, nontender, nondistended. No hepatosplenomegaly. No bruits or masses. Good bowel sounds. Extremities: no cyanosis, clubbing, rash, trace edema Neuro: alert & orientedx3, cranial nerves grossly intact. moves all 4 extremities w/o difficulty. Affect pleasant    ASSESSMENT & PLAN:  1. PAH with right heart failure --suspect combination of WHO Group I and III PAH --Echo 5/18 with complete resolution of RV strain. No  significant TR to estimate RVSP --Repeat RHC 12/18 with mild PAH (mean PA 33) --had been improving with diuresis, O2, Adcirca and letaris but now worse with a constellation of volume overload, daily chills and fatigue. I am unclear what is currently going on with her --Lasix increased recently to 40 bid with improvement in volume status. Will continue. Add spiro 12.5 daily --Pleural fluid analysis and recent CT reviewed. Numerous lymphs but no malignant cells or pathologic lymphadenopathy.  -- RF factor markedly elevated but can be nonspecific. ANCA negative. Will check BCx, ANA, anti-CCP and [place]-70. I have contacted Dr. Melissa Noon office to see if we can move her Rheumatology evaluation up  --I would like to add Selexipag at this point but feel we need to work out   2. Recurrent R pleural effusion --Given improvement in PA pressures doubt effusion related to severe PAH. Macitentan often associated with fluid retention but I have not seen it cause pleural effusions. Previous ab CT with evidence of cirrhosis. Suspect this is hepatic hyrdothorax -- Continue lasix at 40 bid. Add spiro -- Fluid analysis with many lymphs but no malignant cells. Rheum w/u underway -- CXR today with unchanged R>L small to moderate effusions -- Has seen Dr. Roxan Hockey and decided not pursue VATS or Pleurex at this point.   3. Chronic Respiratory failure - On 2 liters oxygen - Follows with Dr. Melvyn Novas.   4. COPD- Gold III  - on inhalers. Follows with Pulmonary. As above  5. Polycythemia suspect due to chronic hypoxia  --Resolved  6. Cirrhosis on CT --due to RHF. Hepatitis serologies are negative  Total time spent 45 minutes. Over half that time spent discussing above.    Mckinsey Keagle,MD 12:54 PM

## 2017-09-28 NOTE — Patient Instructions (Addendum)
Continue Lasix 42m twice daily.  Start Spironolactone 12.57mdaily.  Stop Potassium.  Routine lab work today. Will notify you of abnormal results  Your provider requests you have a chest x-ray today.  Repeat labs (bmet) in 1 week.   Follow up with Dr.Bensimhon on Thursday 10/22/2017 at 1:40pm

## 2017-09-29 LAB — RHEUMATOID FACTOR: Rhuematoid fact SerPl-aCnc: >650 IU/mL — ABNORMAL HIGH (ref 0.0–13.9)

## 2017-09-29 LAB — ANTI-SCLERODERMA ANTIBODY

## 2017-09-30 ENCOUNTER — Telehealth (HOSPITAL_COMMUNITY): Payer: Self-pay | Admitting: *Deleted

## 2017-09-30 NOTE — Telephone Encounter (Signed)
Received fax from Dr Melissa Noon office [facility] Rheumatology) pt is scheduled to see him on 11/03/27 at 10 am

## 2017-10-03 LAB — CULTURE, BLOOD (SINGLE)
CULTURE: NO GROWTH
CULTURE: NO GROWTH
SPECIAL REQUESTS: ADEQUATE
Special Requests: ADEQUATE

## 2017-10-06 ENCOUNTER — Ambulatory Visit (HOSPITAL_COMMUNITY)
Admission: RE | Admit: 2017-10-06 | Discharge: 2017-10-06 | Disposition: A | Discharge status: Home / Self Care | Payer: Medicare HMO | Source: Ambulatory Visit | Attending: Cardiology | Admitting: Cardiology

## 2017-10-06 DIAGNOSIS — I509 Heart failure, unspecified: Secondary | ICD-10-CM | POA: Diagnosis not present

## 2017-10-06 LAB — BASIC METABOLIC PANEL
Anion gap: 9 (ref 5–15)
BUN: 17 mg/dL (ref 6–20)
CALCIUM: 8.7 mg/dL — AB (ref 8.9–10.3)
CO2: 34 mmol/L — AB (ref 22–32)
Chloride: 95 mmol/L — ABNORMAL LOW (ref 101–111)
Creatinine, Ser: 0.75 mg/dL (ref 0.44–1.00)
GFR calc Af Amer: >60 mL/min (ref >60)
Glucose, Bld: 81 mg/dL (ref 65–99)
Potassium: 4.2 mmol/L (ref 3.5–5.1)
Sodium: 138 mmol/L (ref 135–145)

## 2017-10-08 DIAGNOSIS — J188 Other pneumonia, unspecified organism: Secondary | ICD-10-CM | POA: Diagnosis not present

## 2017-10-08 DIAGNOSIS — I10 Essential (primary) hypertension: Secondary | ICD-10-CM | POA: Diagnosis not present

## 2017-10-08 DIAGNOSIS — R062 Wheezing: Secondary | ICD-10-CM | POA: Diagnosis not present

## 2017-10-08 DIAGNOSIS — J45901 Unspecified asthma with (acute) exacerbation: Secondary | ICD-10-CM | POA: Diagnosis not present

## 2017-10-08 DIAGNOSIS — J449 Chronic obstructive pulmonary disease, unspecified: Secondary | ICD-10-CM | POA: Diagnosis not present

## 2017-10-08 DIAGNOSIS — R05 Cough: Secondary | ICD-10-CM | POA: Diagnosis not present

## 2017-10-08 DIAGNOSIS — J45909 Unspecified asthma, uncomplicated: Secondary | ICD-10-CM | POA: Diagnosis not present

## 2017-10-08 DIAGNOSIS — J9601 Acute respiratory failure with hypoxia: Secondary | ICD-10-CM | POA: Diagnosis not present

## 2017-10-08 DIAGNOSIS — R0602 Shortness of breath: Secondary | ICD-10-CM | POA: Diagnosis not present

## 2017-10-13 ENCOUNTER — Other Ambulatory Visit (HOSPITAL_COMMUNITY): Payer: Self-pay | Admitting: *Deleted

## 2017-10-13 MED ORDER — IPRATROPIUM BROMIDE 0.06 % NA SOLN: 2.0000 | Freq: Two times a day (BID) | NASAL | 3 refills | Status: DC

## 2017-10-14 ENCOUNTER — Telehealth (HOSPITAL_COMMUNITY): Payer: Self-pay | Admitting: Pharmacist

## 2017-10-14 NOTE — Telephone Encounter (Signed)
Letairis PA approved by [employer] Part D through 09/07/18.   Ruta Hinds. [name], PharmD, BCPS, CPP Clinical Pharmacist Phone: [phone] 10/14/2017 10:39 AM

## 2017-10-22 ENCOUNTER — Other Ambulatory Visit: Payer: Self-pay

## 2017-10-22 ENCOUNTER — Ambulatory Visit (HOSPITAL_COMMUNITY)
Admission: RE | Admit: 2017-10-22 | Discharge: 2017-10-22 | Disposition: A | Discharge status: Home / Self Care | Payer: Medicare HMO | Source: Ambulatory Visit | Attending: Internal Medicine | Admitting: Internal Medicine

## 2017-10-22 VITALS — BP 122/52 | HR 94 | Wt 134.2 lb

## 2017-10-22 DIAGNOSIS — D751 Secondary polycythemia: Secondary | ICD-10-CM | POA: Insufficient documentation

## 2017-10-22 DIAGNOSIS — J9610 Chronic respiratory failure, unspecified whether with hypoxia or hypercapnia: Secondary | ICD-10-CM | POA: Diagnosis not present

## 2017-10-22 DIAGNOSIS — J90 Pleural effusion, not elsewhere classified: Secondary | ICD-10-CM | POA: Insufficient documentation

## 2017-10-22 DIAGNOSIS — K7460 Unspecified cirrhosis of liver: Secondary | ICD-10-CM | POA: Diagnosis not present

## 2017-10-22 DIAGNOSIS — J449 Chronic obstructive pulmonary disease, unspecified: Secondary | ICD-10-CM | POA: Diagnosis not present

## 2017-10-22 DIAGNOSIS — I50810 Right heart failure, unspecified: Secondary | ICD-10-CM | POA: Insufficient documentation

## 2017-10-22 DIAGNOSIS — I2729 Other secondary pulmonary hypertension: Secondary | ICD-10-CM | POA: Diagnosis not present

## 2017-10-22 DIAGNOSIS — Z7982 Long term (current) use of aspirin: Secondary | ICD-10-CM | POA: Insufficient documentation

## 2017-10-22 DIAGNOSIS — Z87891 Personal history of nicotine dependence: Secondary | ICD-10-CM | POA: Insufficient documentation

## 2017-10-22 DIAGNOSIS — I110 Hypertensive heart disease with heart failure: Secondary | ICD-10-CM | POA: Diagnosis present

## 2017-10-22 DIAGNOSIS — I509 Heart failure, unspecified: Secondary | ICD-10-CM | POA: Diagnosis not present

## 2017-10-22 DIAGNOSIS — R918 Other nonspecific abnormal finding of lung field: Secondary | ICD-10-CM | POA: Insufficient documentation

## 2017-10-22 DIAGNOSIS — I2721 Secondary pulmonary arterial hypertension: Secondary | ICD-10-CM | POA: Diagnosis not present

## 2017-10-22 DIAGNOSIS — Z79899 Other long term (current) drug therapy: Secondary | ICD-10-CM | POA: Diagnosis not present

## 2017-10-22 NOTE — Progress Notes (Signed)
Medication Samples have been provided to the patient.  Drug name: Letairis       Strength: 90m        Qty: 7   LOT:: [id number] Exp.Date: 3/21  Dosing instructions: Take 2 tabs daily  The patient has been instructed regarding the correct time, dose, and frequency of taking this medication, including desired effects and most common side effects.   Leonardo Plaia 2:19 PM 10/22/2017

## 2017-10-22 NOTE — Progress Notes (Signed)
Patient ID: BLEU MOISAN, female   DOB: 05-Oct-1945, 72 y.o.   MRN: [id number]  ADVANCED HF CLINIC NOTE  Patient ID: AMMARA RAJ, female   DOB: May 20, 1946, 72 y.o.   MRN: [id number] Primary Cardiologist: Dr Haroldine Laws  Pulmonary: Dr Melvyn Novas   HPI:  Ms. Crossin is  72 y/o woman with a h/o HTN, COPD (former smoker quit 2015), seasonal allergies, asthma, pulmonary HTN, and diastolic dysfxn.  Admitted 1/23 secondary to progressive DOE, hypoxia, and lower ext edema and has been found to have severe PAH. R/LHC 10/08/15 with normal coronaries, normal CO, and severe PAH PAP 104/49 with 13.6 WU. PAH well out of proportion to left-sided filling pressures and COPD. Started revatio 20 TID 10/08/15 with consideration for macitentan as outpatient. PFTs 10/05/15  FEV1 0.91 (40%)FVC 1.64 (55%), DLCO 8.80 (38%). Auto-immune and infectious serologies negative. VQ negative. Discharge weight 148 pounds.   Returns for routine f/u: Remains on Adcirca 40 and Letairis 10. Recently found to have large R pleural effusion. Has been drained twice. Underwent first thoracentesis on 11/7.  Transudative. CT scan 07/24/17 showed large R effusion and small pericardial effusion. +COPD.  Underwent repeat thoracentesis on 07/29/17 with 310cc out.  Cytology negative. F/u CXR 11/21 showed small bilateral effusions. Lasix doubled to 40bid.  Referred to Dr. Roxan Hockey for possible Pleurex versus VATS in 12/18 but she did not want to proceed.   Here today for routine f/u. Says she is starting to feel better as the weather gets warmer. Remains on lasix 40 bid, Now able to do ADLs without too much problem. Still with arthalgias. RF markedly ++ but other serology negative. Hasn't seen Dr. Amil Amen yet but schedule for later this month. Mild edema. Orthopnea or PND. Weight down 6-7 pounds since 12/18   Last RF > 650 ANCA negative [place]-70 negative  ANA and anti-ccp never resulted  CXR 09/28/17 Small bilateral effusions  Echo 5/18  EF 60-65% RV completely normal. No TR to measure RVSP. No effusion   PAH regimen: 1. Adcirca 40 2. Letairis 10 (Switched to [name] as it was thought that sinusitis might be due to macitentan.)  6MW 8/17; 840 ft (256 M) on 2 L O2, sats ranged 83-95%, HR ranged 96-111.  Echo 3/17 EF 60-65% septum still flattened. RV size improved moderate HK. RVSP ~80. IVC small. Pericardial effusion essentially resolved    RHC 12/18 RA = 5 RV = 56/9 PA = 52/17 (33) PCW = 10 Fick cardiac output/index = 7.5/4.5 PVR = 3.5 WU Ao sat = 96% PA sat = 73%, 72% High SVC sat = 76%  VQ scan low prob. CT chest mild COPD. Moderate pericardial effusion. 3v CAD. + cirrhosis. Hepatitis serologies negative.   PFTs  (FEV1 1.23 (52%) with FVC 2.01 (65%) and DLCO 51% in 7/15) PFT's 10/08/2015 FEV1 0.91 (40 %) ratio 56%DLCO 38 % corrects to 63 % for alv volume  PFTs  12/06/15 FEV1 1.0 (44%) FVC 1.74 (58%0 DLCO 28%  R/LHC 10/08/15 with normal coronaries, normal CO, and sever PAH with 13.6 WU.  Findings: Ao = 108/68 (86) LV = 102/10/11 RA = 9 RV = 97/8/12 PA = 104/49 (72) PCW = 18 Fick cardiac output/index = 3.96/2.34 PVR = 13.6 WU FA sat = 93% PA sat = 66%, 69%  RHC 5/18 RA = 2 RV = 48/4 PA = 45/17 (30) PCW = 8 Fick cardiac output/index = 4.9/3.0 PVR = 4.4 WU Ao sat = 94% PA sat = 68%, 70%  ROS: All  systems negative except as listed in HPI, PMH and Problem List.  SH:  Social History   Socioeconomic History  . Marital status: Married    Spouse name: Not on file  . Number of children: Not on file  . Years of education: Not on file  . Highest education level: Not on file  Social Needs  . Financial resource strain: Not on file  . Food insecurity - worry: Not on file  . Food insecurity - inability: Not on file  . Transportation needs - medical: Not on file  . Transportation needs - non-medical: Not on file  Occupational History  . Not on file  Tobacco Use  . Smoking status: Former Smoker     Packs/day: 1.00    Years: 40.00    Pack years: 40.00    Types: Cigarettes    Last attempt to quit: 02/22/2014    Years since quitting: 3.6  . Smokeless tobacco: Never Used  Substance and Sexual Activity  . Alcohol use: No  . Drug use: No  . Sexual activity: No  Other Topics Concern  . Not on file  Social History Narrative   Married with 2 children.  Independent of ADLs.      Does not have a living will.   Would desire CPR but would not want prolonged life support if futile- husband aware.    FH:  Family History  Problem Relation Age of Onset  . Glaucoma Brother   . Vascular Disease Father        Died of complications from surgery  . Asthma Mother        Died of asthma attack  . Dementia Mother     Past Medical History:  Diagnosis Date  . Acute respiratory failure with hypoxia and hypercapnea 02/26/2014  . Allergic rhinitis due to pollen   . COPD (chronic obstructive pulmonary disease) ([place])   . Diastolic dysfunction    a. 02/2014 Echo: EF 65-70%, Gr 1 DD, RVH;  b. 09/2015 Echo: EF 55%, Gr 1 DD.  . H/O seasonal allergies   . History of tobacco abuse    a. Quit 2015.  [name] Hypertensive heart disease   . Lower extremity edema   . Pericardial effusion    a. 09/2015 Echo: Mod eff w/o tamponade.  . Right heart failure with reduced right ventricular function ([place])    a. 09/2015 Echo: EF 55%, Gr 1 DD, D shaped IV septum, sev dil RV with mod reduced fxn, mod dil RA, RV-RA grad 46mHg, PASP 812mg, mod pericard eff w/o tamponade.  . Severe Pulmonary Hypertension    a. 09/2015 Echo: PASP 8767m.    Current Outpatient Medications  Medication Sig Dispense Refill  . acetaminophen (TYLENOL) 325 MG tablet Take 325 mg by mouth at bedtime as needed for moderate pain or headache.     . aMarland Kitchenetaminophen-codeine (TYLENOL #3) 300-30 MG tablet Take 1 tablet 1 hour prior to procedure then 1 tablet every 4 hours as needed 16 tablet 0  . albuterol (PROVENTIL HFA;VENTOLIN HFA) 108 (90 Base)  MCG/ACT inhaler Inhale 2 puffs every 6 (six) hours as needed into the lungs for wheezing or shortness of breath. 1 Inhaler 11  . ambrisentan (LETAIRIS) 10 MG tablet Take 1 tablet (10 mg total) by mouth daily. 30 tablet 11  . aspirin EC 81 MG EC tablet Take 1 tablet (81 mg total) by mouth daily. 30 tablet 0  . clotrimazole (MYCELEX) 10 MG troche Take 1 tablet (10 mg total)  by mouth 5 (five) times daily. 40 tablet 1  . fluticasone (FLONASE) 50 MCG/ACT nasal spray SHAKE WELL AND INSTILL 2 SPRAYS INTO EACH NOSTRIL ONCE DAILY 48 g 1  . Fluticasone-Umeclidin-Vilant (TRELEGY ELLIPTA) 100-62.5-25 MCG/INH AEPB Inhale 1 puff daily into the lungs. 60 each 11  . furosemide (LASIX) 40 MG tablet Take 1 tablet (40 mg total) by mouth 2 (two) times daily. 60 tablet 3  . ipratropium (ATROVENT) 0.06 % nasal spray Place 2 sprays into both nostrils 2 (two) times daily. 45 mL 3  . OXYGEN 2 to 2lpm 24/7    . Polyethyl Glycol-Propyl Glycol (SYSTANE OP) Apply 1-2 drops to eye daily as needed (dry eyes).    [name] spironolactone (ALDACTONE) 25 MG tablet Take 0.5 tablets (12.5 mg total) by mouth daily. 45 tablet 3  . tadalafil, PAH, (ADCIRCA) 20 MG tablet Take 2 tablets (40 mg total) by mouth daily. 180 tablet 3   No current facility-administered medications for this encounter.     Vitals:   10/22/17 1342  BP: (!) 122/52  Pulse: 94  SpO2: 97%  Weight: 134 lb 4 oz (60.9 kg)   Filed Weights   10/22/17 1342  Weight: 134 lb 4 oz (60.9 kg)     PHYSICAL EXAM: General: Sitting in WC. No resp difficulty  On supplemental O2 HEENT: normal Neck: supple.JVP 6. Carotids 2+ bilat; no bruits. No lymphadenopathy or thryomegaly appreciated. Cor: PMI nondisplaced. Regular rate & rhythm. + RV lift 2/6 TR Lungs: clear dull at base on right  Abdomen: soft, nontender, nondistended. No hepatosplenomegaly. No bruits or masses. Good bowel sounds. Extremities: no cyanosis, clubbing, rash, edema Neuro: alert & orientedx3, cranial nerves  grossly intact. moves all 4 extremities w/o difficulty. Affect pleasant  ASSESSMENT & PLAN:  1. PAH with right heart failure --suspect combination of WHO Group I and III PAH --Echo 5/18 with complete resolution of RV strain. No significant TR to estimate RVSP --Repeat RHC 12/18 with mild PAH (mean PA 33) --much ioproved with diuresis, O2, Adcirca and letaris  --Lasix increased recently to 40 bid with improvement in volume status. Will continue. Add spiro 12.5 daily --Pleural fluid analysis and recent CT reviewed. Numerous lymphs but no malignant cells or pathologic lymphadenopathy. Transudative -- RF factor markedly elevated but can be nonspecific. I do suspect possible CTD given constellation of symptoms. Has f/u with Dr. Amil Amen this week.  -- ANA negative. [place]-70 negative. Anti-CCP weakly positive --Consider adding Selexipag in the near future  2. Recurrent R pleural effusion --Given improvement in PA pressures doubt effusion related to severe PAH. Macitentan often associated with fluid retention but I have not seen it cause pleural effusions. Previous ab CT with evidence of cirrhosis. Suspect this is hepatic hyrdothorax (versus CTD(. CXR today with improved effusions -- Continue lasix at 40 bid. Add spiro -- Fluid analysis with many lymphs but no malignant cells - transudative. Rheum w/u underway   3. Chronic Respiratory failure - Multifactorial. Stable. On 2 liters oxygen - Follows with Dr. Melvyn Novas.   4. COPD- Gold III  - on inhalers. Follows with Pulmonary. As above  5. Polycythemia suspect due to chronic hypoxia  --Resolved  6. Cirrhosis on CT --due to RHF. Hepatitis serologies are negative  Total time spent 45 minutes. Over half that time spent discussing above.   Maddalena Linarez,MD 1:41 PM

## 2017-10-22 NOTE — Patient Instructions (Signed)
Labs today  Chest X-ray  Your physician recommends that you schedule a follow-up appointment in: 2 months with echocardiogram

## 2017-10-23 LAB — ANA W/REFLEX: Anti Nuclear Antibody(ANA): NEGATIVE

## 2017-10-23 LAB — CYCLIC CITRUL PEPTIDE ANTIBODY, IGG/IGA: CCP Antibodies IgG/IgA: 21 units — ABNORMAL HIGH (ref 0–19)

## 2017-10-28 ENCOUNTER — Telehealth (HOSPITAL_COMMUNITY): Payer: Self-pay | Admitting: Pharmacist

## 2017-10-28 NOTE — Telephone Encounter (Signed)
[employer] patient assistance approved for Letairis through 09/07/18.   Ruta Hinds. [name], PharmD, BCPS, CPP Clinical Pharmacist Phone: [phone] 10/28/2017 3:19 PM

## 2017-11-02 DIAGNOSIS — M2550 Pain in unspecified joint: Secondary | ICD-10-CM | POA: Diagnosis not present

## 2017-11-02 DIAGNOSIS — J90 Pleural effusion, not elsewhere classified: Secondary | ICD-10-CM | POA: Diagnosis not present

## 2017-11-02 DIAGNOSIS — R768 Other specified abnormal immunological findings in serum: Secondary | ICD-10-CM | POA: Diagnosis not present

## 2017-11-05 DIAGNOSIS — J45901 Unspecified asthma with (acute) exacerbation: Secondary | ICD-10-CM | POA: Diagnosis not present

## 2017-11-05 DIAGNOSIS — I10 Essential (primary) hypertension: Secondary | ICD-10-CM | POA: Diagnosis not present

## 2017-11-05 DIAGNOSIS — J9601 Acute respiratory failure with hypoxia: Secondary | ICD-10-CM | POA: Diagnosis not present

## 2017-11-05 DIAGNOSIS — R0602 Shortness of breath: Secondary | ICD-10-CM | POA: Diagnosis not present

## 2017-11-05 DIAGNOSIS — R062 Wheezing: Secondary | ICD-10-CM | POA: Diagnosis not present

## 2017-11-05 DIAGNOSIS — J188 Other pneumonia, unspecified organism: Secondary | ICD-10-CM | POA: Diagnosis not present

## 2017-11-05 DIAGNOSIS — J45909 Unspecified asthma, uncomplicated: Secondary | ICD-10-CM | POA: Diagnosis not present

## 2017-11-05 DIAGNOSIS — J449 Chronic obstructive pulmonary disease, unspecified: Secondary | ICD-10-CM | POA: Diagnosis not present

## 2017-11-05 DIAGNOSIS — R05 Cough: Secondary | ICD-10-CM | POA: Diagnosis not present

## 2017-11-06 ENCOUNTER — Other Ambulatory Visit (HOSPITAL_COMMUNITY): Payer: Self-pay | Admitting: Internal Medicine

## 2017-11-11 ENCOUNTER — Encounter (HOSPITAL_COMMUNITY): Payer: Medicare HMO | Admitting: Internal Medicine

## 2017-12-06 DIAGNOSIS — R05 Cough: Secondary | ICD-10-CM | POA: Diagnosis not present

## 2017-12-06 DIAGNOSIS — R0602 Shortness of breath: Secondary | ICD-10-CM | POA: Diagnosis not present

## 2017-12-06 DIAGNOSIS — J188 Other pneumonia, unspecified organism: Secondary | ICD-10-CM | POA: Diagnosis not present

## 2017-12-06 DIAGNOSIS — J9601 Acute respiratory failure with hypoxia: Secondary | ICD-10-CM | POA: Diagnosis not present

## 2017-12-06 DIAGNOSIS — J45901 Unspecified asthma with (acute) exacerbation: Secondary | ICD-10-CM | POA: Diagnosis not present

## 2017-12-06 DIAGNOSIS — I10 Essential (primary) hypertension: Secondary | ICD-10-CM | POA: Diagnosis not present

## 2017-12-06 DIAGNOSIS — J45909 Unspecified asthma, uncomplicated: Secondary | ICD-10-CM | POA: Diagnosis not present

## 2017-12-06 DIAGNOSIS — J449 Chronic obstructive pulmonary disease, unspecified: Secondary | ICD-10-CM | POA: Diagnosis not present

## 2017-12-06 DIAGNOSIS — R062 Wheezing: Secondary | ICD-10-CM | POA: Diagnosis not present

## 2017-12-10 ENCOUNTER — Other Ambulatory Visit (HOSPITAL_COMMUNITY): Payer: Self-pay | Admitting: Cardiology

## 2017-12-10 ENCOUNTER — Other Ambulatory Visit (HOSPITAL_COMMUNITY): Payer: Self-pay | Admitting: *Deleted

## 2017-12-10 MED ORDER — TADALAFIL (PAH) 20 MG PO TABS: 40.0000 mg | ORAL_TABLET | Freq: Every day | ORAL | 3 refills | Status: DC

## 2017-12-21 ENCOUNTER — Encounter (HOSPITAL_COMMUNITY): Payer: Self-pay | Admitting: Internal Medicine

## 2017-12-21 ENCOUNTER — Ambulatory Visit (HOSPITAL_COMMUNITY)
Admission: RE | Admit: 2017-12-21 | Discharge: 2017-12-21 | Disposition: A | Discharge status: Home / Self Care | Payer: Medicare HMO | Source: Ambulatory Visit | Attending: Internal Medicine | Admitting: Internal Medicine

## 2017-12-21 ENCOUNTER — Other Ambulatory Visit: Payer: Self-pay

## 2017-12-21 ENCOUNTER — Ambulatory Visit (HOSPITAL_BASED_OUTPATIENT_CLINIC_OR_DEPARTMENT_OTHER)
Admission: RE | Admit: 2017-12-21 | Discharge: 2017-12-21 | Disposition: A | Discharge status: Home / Self Care | Payer: Medicare HMO | Source: Ambulatory Visit | Attending: Internal Medicine | Admitting: Internal Medicine

## 2017-12-21 ENCOUNTER — Telehealth (HOSPITAL_COMMUNITY): Payer: Self-pay | Admitting: Pharmacist

## 2017-12-21 VITALS — BP 130/52 | HR 91 | Wt 136.0 lb

## 2017-12-21 DIAGNOSIS — Z825 Family history of asthma and other chronic lower respiratory diseases: Secondary | ICD-10-CM | POA: Diagnosis not present

## 2017-12-21 DIAGNOSIS — Z79899 Other long term (current) drug therapy: Secondary | ICD-10-CM | POA: Diagnosis not present

## 2017-12-21 DIAGNOSIS — I272 Pulmonary hypertension, unspecified: Secondary | ICD-10-CM

## 2017-12-21 DIAGNOSIS — J90 Pleural effusion, not elsewhere classified: Secondary | ICD-10-CM

## 2017-12-21 DIAGNOSIS — Z9981 Dependence on supplemental oxygen: Secondary | ICD-10-CM | POA: Insufficient documentation

## 2017-12-21 DIAGNOSIS — I2729 Other secondary pulmonary hypertension: Secondary | ICD-10-CM | POA: Diagnosis not present

## 2017-12-21 DIAGNOSIS — Z8249 Family history of ischemic heart disease and other diseases of the circulatory system: Secondary | ICD-10-CM | POA: Diagnosis not present

## 2017-12-21 DIAGNOSIS — I5032 Chronic diastolic (congestive) heart failure: Secondary | ICD-10-CM

## 2017-12-21 DIAGNOSIS — R918 Other nonspecific abnormal finding of lung field: Secondary | ICD-10-CM

## 2017-12-21 DIAGNOSIS — J9611 Chronic respiratory failure with hypoxia: Secondary | ICD-10-CM | POA: Diagnosis not present

## 2017-12-21 DIAGNOSIS — Z87891 Personal history of nicotine dependence: Secondary | ICD-10-CM | POA: Insufficient documentation

## 2017-12-21 DIAGNOSIS — K7460 Unspecified cirrhosis of liver: Secondary | ICD-10-CM | POA: Insufficient documentation

## 2017-12-21 DIAGNOSIS — I517 Cardiomegaly: Secondary | ICD-10-CM | POA: Insufficient documentation

## 2017-12-21 DIAGNOSIS — I110 Hypertensive heart disease with heart failure: Secondary | ICD-10-CM | POA: Diagnosis not present

## 2017-12-21 DIAGNOSIS — I509 Heart failure, unspecified: Secondary | ICD-10-CM

## 2017-12-21 DIAGNOSIS — J449 Chronic obstructive pulmonary disease, unspecified: Secondary | ICD-10-CM | POA: Insufficient documentation

## 2017-12-21 DIAGNOSIS — I998 Other disorder of circulatory system: Secondary | ICD-10-CM

## 2017-12-21 DIAGNOSIS — I50810 Right heart failure, unspecified: Secondary | ICD-10-CM | POA: Diagnosis not present

## 2017-12-21 DIAGNOSIS — Z7982 Long term (current) use of aspirin: Secondary | ICD-10-CM | POA: Insufficient documentation

## 2017-12-21 DIAGNOSIS — D751 Secondary polycythemia: Secondary | ICD-10-CM | POA: Diagnosis not present

## 2017-12-21 DIAGNOSIS — J9610 Chronic respiratory failure, unspecified whether with hypoxia or hypercapnia: Secondary | ICD-10-CM | POA: Diagnosis not present

## 2017-12-21 NOTE — Patient Instructions (Signed)
Chest Xray today  Your physician recommends that you schedule a follow-up appointment in: 4 month with Dr. Haroldine Laws

## 2017-12-21 NOTE — Telephone Encounter (Signed)
Patient [place] Program approved for assistance with Joanie [place] and Letairis through 12/02/18.   Ruta Hinds. [name], PharmD, BCPS, CPP Clinical Pharmacist Phone: [phone] 12/21/2017 9:46 AM

## 2017-12-21 NOTE — Progress Notes (Signed)
  Echocardiogram 2D Echocardiogram has been performed.  Jannett Celestine 12/21/2017, 3:10 PM

## 2017-12-21 NOTE — Progress Notes (Signed)
Patient ID: Linda Stevens, female   DOB: 21-Sep-1945, 72 y.o.   MRN: [id number]  [facility]IN[facility] NOTE  Patient ID: Linda Stevens, female   DOB: [birth year], 72 y.o.   MRN: [id number] Primary Cardiologist: Dr Haroldine Laws  Pulmonary: Dr Melvyn Novas   HPI:  Linda Stevens is  72 y/o woman with a h/o HTN, COPD (former smoker quit 2015), seasonal allergies, asthma, pulmonary HTN, and diastolic dysfxn.  Admitted 1/23 secondary to progressive DOE, hypoxia, and lower ext edema and has been found to have severe PAH. R/LHC 10/08/15 with normal coronaries, normal CO, and severe PAH PAP 104/49 with 13.6 WU. PAH well out of proportion to left-sided filling pressures and COPD. Started revatio 20 TID 10/08/15 with consideration for macitentan as outpatient. PFTs 10/05/15  FEV1 0.91 (40%)FVC 1.64 (55%), DLCO 8.80 (38%). Auto-immune and infectious serologies negative. VQ negative. Discharge weight 148 pounds.   Returns for routine f/u: Remains on Adcirca 40 and Letairis 10. Recently found to have large R pleural effusion. Has been drained twice. Underwent first thoracentesis on 11/7.  Transudative. CT scan 07/24/17 showed large R effusion and small pericardial effusion. +COPD.  Underwent repeat thoracentesis on 07/29/17 with 310cc out.  Cytology negative. F/u CXR 11/21 showed small bilateral effusions. Lasix doubled to 40bid.  Referred to Dr. Roxan Hockey for possible Pleurex versus VATS in 12/18 but she did not want to proceed.   RF markedly ++ but other serology negative. Saw Dr. Amil Amen who said she did not have RA but was diagnosed with polyarthralgia/OA.    Here today for routine f/u. At last visit lasix increased to 40 bid. Says she is feeling much better. Breathing better. Starting to walk on her TM again for 15 mins at time. During exercise turns O2 up to 4L. Sats 90-92%. No CP. No edema. No orthopnea or PND. Taking lasix 40 bid and spiro 12.5 daily   Echo today EF 65% RV normal. No effusion Personally  reviewed Echo 5/18 EF 60-65% RV completely normal. No TR to measure RVSP. No effusion  Echo 3/17 EF 60-65% septum still flattened. RV size improved moderate HK. RVSP ~80. IVC small. Pericardial effusion essentially resolved   CXR 09/28/17 Small bilateral effusions    PAH regimen: 1. Adcirca 40 2. Letairis 10 (Switched to [name] as it was thought that sinusitis might be due to macitentan.)  6MW 8/17; 840 ft (256 M) on 2 L O2, sats ranged 83-95%, HR ranged 96-111.   [place] 12/18 RA = 5 RV = 56/9 PA = 52/17 (33) PCW = 10 Fick cardiac output/index = 7.5/4.5 PVR = 3.5 WU Ao sat = 96% PA sat = 73%, 72% High SVC sat = 76%  VQ scan low prob. CT chest mild COPD. Moderate pericardial effusion. 3v CAD. + cirrhosis. Hepatitis serologies negative.   PFTs  (FEV1 1.23 (52%) with FVC 2.01 (65%) and DLCO 51% in 7/15) PFT's 10/08/2015 FEV1 0.91 (40 %) ratio 56%DLCO 38 % corrects to 63 % for alv volume  PFTs  12/06/15 FEV1 1.0 (44%) FVC 1.74 (58%0 DLCO 28%  R/LHC 10/08/15 with normal coronaries, normal CO, and sever PAH with 13.6 WU.  Findings: Ao = 108/68 (86) LV = 102/10/11 RA = 9 RV = 97/8/12 PA = 104/49 (72) PCW = 18 Fick cardiac output/index = 3.96/2.34 PVR = 13.6 WU FA sat = 93% PA sat = 66%, 69%  RHC 5/18 RA = 2 RV = 48/4 PA = 45/17 (30) PCW = 8 Fick cardiac output/index = 4.9/3.0  PVR = 4.4 WU Ao sat = 94% PA sat = 68%, 70%  ROS: All systems negative except as listed in HPI, PMH and Problem List.  SH:  Social History   Socioeconomic History  . Marital status: Married    Spouse name: Not on file  . Number of children: Not on file  . Years of education: Not on file  . Highest education level: Not on file  Occupational History  . Not on file  Social Needs  . Financial resource strain: Not on file  . Food insecurity:    Worry: Not on file    Inability: Not on file  . Transportation needs:    Medical: Not on file    Non-medical: Not on file  Tobacco Use  .  Smoking status: Former Smoker    Packs/day: 1.00    Years: 40.00    Pack years: 40.00    Types: Cigarettes    Last attempt to quit: 02/22/2014    Years since quitting: 3.8  . Smokeless tobacco: Never Used  Substance and Sexual Activity  . Alcohol use: No  . Drug use: No  . Sexual activity: Never  Lifestyle  . Physical activity:    Days per week: Not on file    Minutes per session: Not on file  . Stress: Not on file  Relationships  . Social connections:    Talks on phone: Not on file    Gets together: Not on file    Attends religious service: Not on file    Active member of club or organization: Not on file    Attends meetings of clubs or organizations: Not on file    Relationship status: Not on file  . Intimate partner violence:    Fear of current or ex partner: Not on file    Emotionally abused: Not on file    Physically abused: Not on file    Forced sexual activity: Not on file  Other Topics Concern  . Not on file  Social History Narrative   Married with 2 children.  Independent of ADLs.      Does not have a living will.   Would desire CPR but would not want prolonged life support if futile- husband aware.    FH:  Family History  Problem Relation Age of Onset  . Glaucoma Brother   . Vascular Disease Father        Died of complications from surgery  . Asthma Mother        Died of asthma attack  . Dementia Mother     Past Medical History:  Diagnosis Date  . Acute respiratory failure with hypoxia and hypercapnea 02/26/2014  . Allergic rhinitis due to pollen   . COPD (chronic obstructive pulmonary disease) ([place])   . Diastolic dysfunction    a. 02/2014 Echo: EF 65-70%, Gr 1 DD, RVH;  b. 09/2015 Echo: EF 55%, Gr 1 DD.  . H/O seasonal allergies   . History of tobacco abuse    a. Quit 2015.  [name] Hypertensive heart disease   . Lower extremity edema   . Pericardial effusion    a. 09/2015 Echo: Mod eff w/o tamponade.  . Right heart failure with reduced right  ventricular function ([place])    a. 09/2015 Echo: EF 55%, Gr 1 DD, D shaped IV septum, sev dil RV with mod reduced fxn, mod dil RA, RV-RA grad 76mHg, PASP 854mg, mod pericard eff w/o tamponade.  . Severe Pulmonary Hypertension  a. 09/2015 Echo: PASP 28mHg.    Current Outpatient Medications  Medication Sig Dispense Refill  . acetaminophen (TYLENOL) 325 MG tablet Take 325 mg by mouth at bedtime as needed for moderate pain or headache.     .Marland Kitchenacetaminophen-codeine (TYLENOL #3) 300-30 MG tablet Take 1 tablet 1 hour prior to procedure then 1 tablet every 4 hours as needed 16 tablet 0  . albuterol (PROVENTIL HFA;VENTOLIN HFA) 108 (90 Base) MCG/ACT inhaler Inhale 2 puffs every 6 (six) hours as needed into the lungs for wheezing or shortness of breath. 1 Inhaler 11  . ambrisentan (LETAIRIS) 10 MG tablet Take 1 tablet (10 mg total) by mouth daily. 30 tablet 11  . aspirin EC 81 MG EC tablet Take 1 tablet (81 mg total) by mouth daily. 30 tablet 0  . clotrimazole (MYCELEX) 10 MG troche Take 1 tablet (10 mg total) by mouth 5 (five) times daily. 40 tablet 1  . fluticasone (FLONASE) 50 MCG/ACT nasal spray SHAKE WELL AND INSTILL 2 SPRAYS INTO EACH NOSTRIL ONCE DAILY 48 g 1  . Fluticasone-Umeclidin-Vilant (TRELEGY ELLIPTA) 100-62.5-25 MCG/INH AEPB Inhale 1 puff daily into the lungs. 60 each 11  . furosemide (LASIX) 40 MG tablet Take 1 tablet (40 mg total) by mouth 2 (two) times daily. 60 tablet 3  . ipratropium (ATROVENT) 0.06 % nasal spray Place 2 sprays into both nostrils 2 (two) times daily. 45 mL 3  . OXYGEN 2 to 2lpm 24/7    . Polyethyl Glycol-Propyl Glycol (SYSTANE OP) Apply 1-2 drops to eye daily as needed (dry eyes).    .Marland Kitchenspironolactone (ALDACTONE) 25 MG tablet Take 0.5 tablets (12.5 mg total) by mouth daily. 45 tablet 3  . tadalafil, PAH, (ADCIRCA) 20 MG tablet Take 2 tablets (40 mg total) by mouth daily. 180 tablet 3   No current facility-administered medications for this encounter.     Vitals:    12/21/17 1507  BP: (!) 130/52  Pulse: 91  SpO2: 93%  Weight: 136 lb (61.7 kg)   Filed Weights   12/21/17 1507  Weight: 136 lb (61.7 kg)     PHYSICAL EXAM: General:  Sitting in WC wearing O2. No resp difficulty HEENT: normal Neck: supple. no JVD. Carotids 2+ bilat; no bruits. No lymphadenopathy or thryomegaly appreciated. Cor: PMI nondisplaced. Regular rate & rhythm. No rubs, gallops or murmurs. Lungs: decreased throughout dull at R base  Abdomen: soft, nontender, nondistended. No hepatosplenomegaly. No bruits or masses. Good bowel sounds. Extremities: no cyanosis, clubbing, rash, edema Neuro: alert & orientedx3, cranial nerves grossly intact. moves all 4 extremities w/o difficulty. Affect pleasant   ASSESSMENT & PLAN:  1. PAH with right heart failure --suspect combination of WHO Group I and III PAH --Echo 5/18 with complete resolution of RV strain. No significant TR to estimate RVSP --Repeat echo today with EF 65% no evidence of RV strain Personally reviewed --Repeat RHC 12/18 with mild PAH (mean PA 33) --Much improved with increased diuresis, O2, Adcirca and letaris  --Continue lasix 40 bid and spiro 12.5. Check labs today  2. Recurrent R pleural effusion --Given improvement in PA pressures doubt effusion related to severe PAH. Macitentan often associated with fluid retention but I have not seen it cause pleural effusions. Previous ab CT with evidence of cirrhosis.  -- Fluid analysis with many lymphs but no malignant cells - transudative.  -- Effusion much improved on CXR today (small)  -- Rheum work-up negative so far.  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 Now  controlled with with diuresis  3. Chronic Respiratory failure - Multifactorial. Stable. On 2 liters oxygen - Follows with Dr. Melvyn Novas.   4. COPD- Gold III  - on inhalers. Follows with Pulmonary. As above  5. Polycythemia suspect due to chronic hypoxia  --Resolved  6. Cirrhosis on CT --due to  RHF. Hepatitis serologies are negative  Quillian Quince Gracia Saggese,MD 3:40 PM

## 2018-01-05 DIAGNOSIS — R062 Wheezing: Secondary | ICD-10-CM | POA: Diagnosis not present

## 2018-01-05 DIAGNOSIS — I10 Essential (primary) hypertension: Secondary | ICD-10-CM | POA: Diagnosis not present

## 2018-01-05 DIAGNOSIS — J45901 Unspecified asthma with (acute) exacerbation: Secondary | ICD-10-CM | POA: Diagnosis not present

## 2018-01-05 DIAGNOSIS — J9601 Acute respiratory failure with hypoxia: Secondary | ICD-10-CM | POA: Diagnosis not present

## 2018-01-05 DIAGNOSIS — J45909 Unspecified asthma, uncomplicated: Secondary | ICD-10-CM | POA: Diagnosis not present

## 2018-01-05 DIAGNOSIS — R0602 Shortness of breath: Secondary | ICD-10-CM | POA: Diagnosis not present

## 2018-01-05 DIAGNOSIS — J188 Other pneumonia, unspecified organism: Secondary | ICD-10-CM | POA: Diagnosis not present

## 2018-01-05 DIAGNOSIS — J449 Chronic obstructive pulmonary disease, unspecified: Secondary | ICD-10-CM | POA: Diagnosis not present

## 2018-01-05 DIAGNOSIS — R05 Cough: Secondary | ICD-10-CM | POA: Diagnosis not present

## 2018-01-20 ENCOUNTER — Other Ambulatory Visit: Payer: Self-pay | Admitting: Internal Medicine

## 2018-01-27 ENCOUNTER — Telehealth: Payer: Self-pay | Admitting: Family Medicine

## 2018-01-27 NOTE — Telephone Encounter (Signed)
See below crm Is it ok to schedule transfer appointment with you or would you prefer me offer NP  Copied from [place] [phone]. Topic: General - Other >> Jan 27, 2018 11:23 AM Valla Leaver wrote: Reason for CRM: Patient would like to stay at [facility] with Dr. Diona Browner instead of switch to [place] with Deborra Medina. Her husband, Delfino Lovett, [employer].

## 2018-01-28 DIAGNOSIS — R768 Other specified abnormal immunological findings in serum: Secondary | ICD-10-CM | POA: Diagnosis not present

## 2018-01-28 DIAGNOSIS — E663 Overweight: Secondary | ICD-10-CM | POA: Diagnosis not present

## 2018-01-28 DIAGNOSIS — M2550 Pain in unspecified joint: Secondary | ICD-10-CM | POA: Diagnosis not present

## 2018-01-28 DIAGNOSIS — Z6825 Body mass index (BMI) 25.0-25.9, adult: Secondary | ICD-10-CM | POA: Diagnosis not present

## 2018-01-28 DIAGNOSIS — J90 Pleural effusion, not elsewhere classified: Secondary | ICD-10-CM | POA: Diagnosis not present

## 2018-01-28 NOTE — Telephone Encounter (Signed)
She can transfer to me. She has a complicated history so she will need an appt for 30 min transfer of care ( has not seen Dr. Deborra Medina since 2017) and she is also due for medicare wellness which please have her make appt for that and CPX with med about 3 months after TOC appt.

## 2018-01-28 NOTE — Telephone Encounter (Signed)
Appointment 7/9 transfer care to dr Diona Browner  Medicare wellness 11/11 cpx 11/15 Mailed new pt paperwork Pt aware

## 2018-02-05 DIAGNOSIS — R05 Cough: Secondary | ICD-10-CM | POA: Diagnosis not present

## 2018-02-05 DIAGNOSIS — J45901 Unspecified asthma with (acute) exacerbation: Secondary | ICD-10-CM | POA: Diagnosis not present

## 2018-02-05 DIAGNOSIS — J9601 Acute respiratory failure with hypoxia: Secondary | ICD-10-CM | POA: Diagnosis not present

## 2018-02-05 DIAGNOSIS — R062 Wheezing: Secondary | ICD-10-CM | POA: Diagnosis not present

## 2018-02-05 DIAGNOSIS — J45909 Unspecified asthma, uncomplicated: Secondary | ICD-10-CM | POA: Diagnosis not present

## 2018-02-05 DIAGNOSIS — J449 Chronic obstructive pulmonary disease, unspecified: Secondary | ICD-10-CM | POA: Diagnosis not present

## 2018-02-05 DIAGNOSIS — I10 Essential (primary) hypertension: Secondary | ICD-10-CM | POA: Diagnosis not present

## 2018-02-05 DIAGNOSIS — J188 Other pneumonia, unspecified organism: Secondary | ICD-10-CM | POA: Diagnosis not present

## 2018-02-05 DIAGNOSIS — R0602 Shortness of breath: Secondary | ICD-10-CM | POA: Diagnosis not present

## 2018-02-17 ENCOUNTER — Other Ambulatory Visit (HOSPITAL_COMMUNITY): Payer: Self-pay | Admitting: Internal Medicine

## 2018-03-01 ENCOUNTER — Encounter (HOSPITAL_COMMUNITY): Payer: Self-pay

## 2018-03-02 ENCOUNTER — Telehealth (HOSPITAL_COMMUNITY): Payer: Self-pay | Admitting: *Deleted

## 2018-03-02 NOTE — Telephone Encounter (Signed)
[employer] re-opened.  Patient has $5,300 towards tadalafil from 12/10/17 through 12/10/18.

## 2018-03-07 DIAGNOSIS — R0602 Shortness of breath: Secondary | ICD-10-CM | POA: Diagnosis not present

## 2018-03-07 DIAGNOSIS — J188 Other pneumonia, unspecified organism: Secondary | ICD-10-CM | POA: Diagnosis not present

## 2018-03-07 DIAGNOSIS — J45909 Unspecified asthma, uncomplicated: Secondary | ICD-10-CM | POA: Diagnosis not present

## 2018-03-07 DIAGNOSIS — J9601 Acute respiratory failure with hypoxia: Secondary | ICD-10-CM | POA: Diagnosis not present

## 2018-03-07 DIAGNOSIS — R05 Cough: Secondary | ICD-10-CM | POA: Diagnosis not present

## 2018-03-07 DIAGNOSIS — J449 Chronic obstructive pulmonary disease, unspecified: Secondary | ICD-10-CM | POA: Diagnosis not present

## 2018-03-07 DIAGNOSIS — R062 Wheezing: Secondary | ICD-10-CM | POA: Diagnosis not present

## 2018-03-07 DIAGNOSIS — J45901 Unspecified asthma with (acute) exacerbation: Secondary | ICD-10-CM | POA: Diagnosis not present

## 2018-03-07 DIAGNOSIS — I10 Essential (primary) hypertension: Secondary | ICD-10-CM | POA: Diagnosis not present

## 2018-03-16 ENCOUNTER — Encounter: Payer: Self-pay | Admitting: Family Medicine

## 2018-03-16 ENCOUNTER — Ambulatory Visit (INDEPENDENT_AMBULATORY_CARE_PROVIDER_SITE_OTHER): Payer: Medicare HMO | Admitting: Family Medicine

## 2018-03-16 ENCOUNTER — Other Ambulatory Visit: Payer: Self-pay

## 2018-03-16 VITALS — BP 120/60 | HR 101 | Temp 98.3°F | Ht 63.0 in | Wt 138.5 lb

## 2018-03-16 DIAGNOSIS — J301 Allergic rhinitis due to pollen: Secondary | ICD-10-CM | POA: Diagnosis not present

## 2018-03-16 DIAGNOSIS — I10 Essential (primary) hypertension: Secondary | ICD-10-CM

## 2018-03-16 DIAGNOSIS — J449 Chronic obstructive pulmonary disease, unspecified: Secondary | ICD-10-CM

## 2018-03-16 DIAGNOSIS — K7460 Unspecified cirrhosis of liver: Secondary | ICD-10-CM

## 2018-03-16 DIAGNOSIS — I2721 Secondary pulmonary arterial hypertension: Secondary | ICD-10-CM | POA: Diagnosis not present

## 2018-03-16 DIAGNOSIS — J90 Pleural effusion, not elsewhere classified: Secondary | ICD-10-CM

## 2018-03-16 DIAGNOSIS — J9611 Chronic respiratory failure with hypoxia: Secondary | ICD-10-CM

## 2018-03-16 DIAGNOSIS — I509 Heart failure, unspecified: Secondary | ICD-10-CM | POA: Diagnosis not present

## 2018-03-16 NOTE — Assessment & Plan Note (Signed)
On continuous oxygen 

## 2018-03-16 NOTE — Assessment & Plan Note (Signed)
Well controlled. Continue current medication.  

## 2018-03-16 NOTE — Assessment & Plan Note (Signed)
Followed by Dr. Melvyn Novas on trelegy and albuterol prn.

## 2018-03-16 NOTE — Progress Notes (Signed)
Subjective:    Patient ID: Linda Stevens, female    DOB: [birth year], 72 y.o.   MRN: [id number]  HPI    72 year old female pt presents for transfer of Care.  Previous PCP Dr. Deborra Medina. Last OV  Few years ago.  Followed by Dr. Annamary Rummage of CHF systolic and PAH causing causing  Pericardial effusion, pleural effusion 2017 08/2017 cath heart cath improved pulmonary hypertension Did have reduced ejection function  Last ECHO 12/2017 EF 62-03%  grade 1 diastolic dysfunction  On letaris, tadalafil, spironolactone using lasix  BID. No autoimmune cause of pleural effusion ( Saw Dr. Amil Amen for increased RF. Told no RA.   COPD: Dr. Melvyn Novas  Continuous oxygen. Breathing is stable on Trelegy, using albuterol as needed. She has noted hoarseness in last week, intermittent.  Has had some new cough, white..hx of allergies. Former smoker , quit 2015, smoker for > 40 years.    HTN:  BP Readings from Last 3 Encounters:  03/16/18 120/60  12/21/17 (!) 130/52  10/22/17 (!) 122/52   Hepatic cirrhosis:  ?  due to RHF. Hepatitis serologies are negative  no ETOH.  No family history   Blood pressure 120/60, pulse (!) 101, temperature 98.3 F (36.8 C), temperature source Oral, height _0  (1.6 m), weight 138 lb 8 oz (62.8 kg), SpO2 92 %.  Review of Systems  Constitutional: Negative for fatigue and fever.  HENT: Negative for congestion.   Eyes: Negative for pain.  Respiratory: Positive for shortness of breath. Negative for cough.        Hoarse voice and post nasal drip  Cardiovascular: Negative for chest pain, palpitations and leg swelling.  Gastrointestinal: Negative for abdominal pain.  Genitourinary: Negative for dysuria and vaginal bleeding.  Musculoskeletal: Negative for back pain.  Neurological: Negative for syncope, light-headedness and headaches.  Psychiatric/Behavioral: Negative for dysphoric mood.       Objective:   Physical Exam  Constitutional: Vital signs are normal. She  appears well-developed and well-nourished. She is cooperative.  Non-toxic appearance. She does not appear ill. No distress.  HENT:  Head: Normocephalic.  Right Ear: Hearing, tympanic membrane, external ear and ear canal normal. Tympanic membrane is not erythematous, not retracted and not bulging.  Left Ear: Hearing, tympanic membrane, external ear and ear canal normal. Tympanic membrane is not erythematous, not retracted and not bulging.  Nose: Nose normal. No mucosal edema or rhinorrhea. Right sinus exhibits no maxillary sinus tenderness and no frontal sinus tenderness. Left sinus exhibits no maxillary sinus tenderness and no frontal sinus tenderness.  Mouth/Throat: Uvula is midline, oropharynx is clear and moist and mucous membranes are normal.  Eyes: Pupils are equal, round, and reactive to light. Conjunctivae, EOM and lids are normal. Lids are everted and swept, no foreign bodies found.  Neck: Trachea normal and normal range of motion. Neck supple. Carotid bruit is not present. No thyroid mass and no thyromegaly present.  Cardiovascular: Normal rate, regular rhythm, S1 normal, S2 normal, normal heart sounds, intact distal pulses and normal pulses. Exam reveals no gallop and no friction rub.  No murmur heard. Pulmonary/Chest: Effort normal and breath sounds normal. No tachypnea. No respiratory distress. She has no decreased breath sounds. She has no wheezes. She has no rhonchi. She has no rales.  Abdominal: Soft. Normal appearance and bowel sounds are normal. She exhibits no distension, no fluid wave, no abdominal bruit and no mass. There is no hepatosplenomegaly. There is no tenderness. There is no rebound,  no guarding and no CVA tenderness. No hernia.  Lymphadenopathy:    She has no cervical adenopathy.    She has no axillary adenopathy.  Neurological: She is alert. She has normal strength. No cranial nerve deficit or sensory deficit.  Skin: Skin is warm, dry and intact. No rash noted.    Psychiatric: Her speech is normal and behavior is normal. Judgment and thought content normal. Her mood appears not anxious. Cognition and memory are normal. She does not exhibit a depressed mood.          Assessment & Plan:

## 2018-03-16 NOTE — Assessment & Plan Note (Signed)
IMproved with last cath in 2018

## 2018-03-16 NOTE — Assessment & Plan Note (Signed)
Initially reduced EF.[name] Now nml EF on medication. Followed by Dr. Jerald Kief.

## 2018-03-16 NOTE — Assessment & Plan Note (Signed)
Followed by Cards... Rheum eval was negative.

## 2018-03-16 NOTE — Assessment & Plan Note (Signed)
?  If due to RHF. Hepatitis serologies are negative.

## 2018-03-16 NOTE — Patient Instructions (Addendum)
Return for wellness as scheduled.  Continue regular exercise and healthy eating.  Can try trial of plain Claritin for hoarse voice.

## 2018-04-07 DIAGNOSIS — J45901 Unspecified asthma with (acute) exacerbation: Secondary | ICD-10-CM | POA: Diagnosis not present

## 2018-04-07 DIAGNOSIS — J188 Other pneumonia, unspecified organism: Secondary | ICD-10-CM | POA: Diagnosis not present

## 2018-04-07 DIAGNOSIS — J449 Chronic obstructive pulmonary disease, unspecified: Secondary | ICD-10-CM | POA: Diagnosis not present

## 2018-04-07 DIAGNOSIS — I10 Essential (primary) hypertension: Secondary | ICD-10-CM | POA: Diagnosis not present

## 2018-04-07 DIAGNOSIS — R062 Wheezing: Secondary | ICD-10-CM | POA: Diagnosis not present

## 2018-04-07 DIAGNOSIS — J45909 Unspecified asthma, uncomplicated: Secondary | ICD-10-CM | POA: Diagnosis not present

## 2018-04-07 DIAGNOSIS — R0602 Shortness of breath: Secondary | ICD-10-CM | POA: Diagnosis not present

## 2018-04-07 DIAGNOSIS — J9601 Acute respiratory failure with hypoxia: Secondary | ICD-10-CM | POA: Diagnosis not present

## 2018-04-07 DIAGNOSIS — R05 Cough: Secondary | ICD-10-CM | POA: Diagnosis not present

## 2018-04-26 ENCOUNTER — Ambulatory Visit: Payer: Medicare HMO | Admitting: Internal Medicine

## 2018-04-26 ENCOUNTER — Encounter: Payer: Self-pay | Admitting: Internal Medicine

## 2018-04-26 VITALS — BP 110/70 | HR 115 | Ht 63.0 in | Wt 140.0 lb

## 2018-04-26 DIAGNOSIS — J449 Chronic obstructive pulmonary disease, unspecified: Secondary | ICD-10-CM

## 2018-04-26 DIAGNOSIS — J9611 Chronic respiratory failure with hypoxia: Secondary | ICD-10-CM | POA: Diagnosis not present

## 2018-04-26 DIAGNOSIS — J9612 Chronic respiratory failure with hypercapnia: Secondary | ICD-10-CM

## 2018-04-26 DIAGNOSIS — J90 Pleural effusion, not elsewhere classified: Secondary | ICD-10-CM

## 2018-04-26 MED ORDER — ALBUTEROL SULFATE HFA 108 (90 BASE) MCG/ACT IN AERS: INHALATION_SPRAY | RESPIRATORY_TRACT | 11 refills | Status: DC

## 2018-04-26 MED ORDER — CLOTRIMAZOLE 10 MG MT TROC: 10.0000 mg | Freq: Every day | OROMUCOSAL | 1 refills | Status: DC

## 2018-04-26 NOTE — Progress Notes (Signed)
Subjective:   Patient ID: Linda Stevens, female    DOB: 03/20/1946  MRN: [id number]    Brief patient profile:  18 yowf quit smoking 02/22/14  with  GOLD II/III  copd/ cor pulmonale vs HP syndrome related to cirrhosis     Procedures: 1/24/17ECHO : EF 29%, grade 1 diastolic dysfx, severe RV dilation, PAS 87 mmHg, mod pericardial effusion 10/04/15  Doppler legs >> negative 10/04/15  V/Q scan >> low probability for PE 10/08/15 Rt/Lt heart cath >> RA 9, RV 97/8/12, PA 104/49/72, PCW 18, CI 2.34, PVR 13.6 10/05/15  PFT >> FEV1 0.91 (40%), FEV1% 56, TLC 4.32 (88%), DLCO 38%, no BD    11/06/2015  Transition of care f/u ov/Tawonda Legaspi re: copd gold III/ chronic resp failure/ ? Cor pulmonale vs hepatopulmonary syndrome?  Chief Complaint  Patient presents with  . Follow-up    Pt states overall her breathing is doing well. She is currrently taking Dulera, but ins prefers advair.   doe = MMRC2 = can't walk a nl pace on a flat grade s sob even on 02 1.5 lpm sometimes increases to 2 Very confused with details of care / leg swelling resolved  rec Try symbicort 160 Take 2 puffs first thing in am and then another 2 puffs about 12 hours later.  Continue spiriva for now pending follow up pfts Wear the oxygen as much as you can at 1.5 lpm  Please schedule a follow up office visit in 4 weeks, ok to do wlh PFTs same day    12/06/2015  f/u ov/Karell Tukes re: GOLD II / spiriva/symbiocrt maint  Chief Complaint  Patient presents with  . Follow-up    PFT done at [facility]. Pt c/o min cough with clear sputum- relates to allergies.   no change doe = MMRC2 = can't walk a nl pace on a flat grade s sob  Even on 02  Main new problem is nasal congestion/ neg resp to flonase/ irritated by 02 but can't use humidifier at low flow rate  rec Change symbicort to advair 115 Take 2 puffs first thing in am and then another 2 puffs about 12 hours later. Work on inhaler technique:  Prednisone 10 mg take  4 each am x 2 days,   2 each am x 2  days,  1 each am x 2 days and stop  I emphasized that nasal steroids have no immediate benefit in terms   Ok to take clariton or allegra as needed for allergies Consult Dr [place] Bells next available re pulmonary hypertension          07/07/2017 acute extended ov/Drinda Belgard re: COPD  GOLD III/ Incruse/advair (unable to name them but sure that's what they are Chief Complaint  Patient presents with  . Follow-up    Pt not feeling well, not breathing well since this past weekend. Pt has lower back pain moving to the sides affecting her breathing, pt has O2 turned up to 3 liters instead of 2 normally.   some raspy coughing first thing in am prod just white mucus  x around a week prior to OV   Last week can't really lie down due to back pain but not pleuritic / assoc with urgency  Has not tried even tylenol for back pain but heat seems to help. Denies trauma Doe minimally worse, better on higher 02 as above  rec   Doxycycline 100 mg twice daily x 10 days  Please remember to go to the lab and x-ray  department: dx new R effusion   thoracentesis  07/15/2017   X 580 cc with wbc's 1974 with L >> P .  Prot 3.5 , nl glucose   Alb 1.9  cyt neg with pred of T lymphocytes         07/17/2017  f/u ov/Devan Babino re:  COPD III and 02 2.5 increases to 4-5 with ex / new R effusion/ exudate  Chief Complaint  Patient presents with  . Follow-up    Breathing is much improved. She is coughing less sputum is minimal and clear.   sleeps at 45 degrees on 2.5 lpm and able to lie flatter since tap  Overall back near baseline p tap rec Plan A = Automatic = Trelegy one click x two good drags  Stop incruse and advair  Plan B = Backup Only use your albuterol (Proair=Red/ proventil=yellow) as a rescue medication to be used if you can't catch your breath   Please schedule a follow up office visit in 4 weeks, sooner if needed  - late add:  CT chest with contrast needed due to persistent atx on R base> referred to T surgery >  declined intervention, improved with diuresis      04/26/2018  f/u ov/Zoey Gilkeson re:  COPD III/ maint trelegy  and 02 on 2.5-3 up to 5lpm whne  on treadmill  Chief Complaint  Patient presents with  . Follow-up    SOB with activity and productive cough mucus clear to white.   Dyspnea:  treadmill x 510 steps x 20 min x slt incline tendency is better Cough: minimal white mucus mostly in ams Sleeping: slt angle < 30 degrees  SABA use: not using much  02: as above    No obvious day to day or daytime variability or assoc excess/ purulent sputum or mucus plugs or hemoptysis or cp or chest tightness, subjective wheeze or overt sinus or hb symptoms.   Sleeping as above  without nocturnal  or early am exacerbation  of respiratory  c/o's or need for noct saba. Also denies any obvious fluctuation of symptoms with weather or environmental changes or other aggravating or alleviating factors except as outlined above   No unusual exposure hx or h/o childhood pna/ asthma or knowledge of premature birth.  Current Allergies, Complete Past Medical History, Past Surgical History, Family History, and Social History were reviewed in [employer] record.  ROS  The following are not active complaints unless bolded Hoarseness, sore throat, dysphagia, dental problems, itching, sneezing,  nasal congestion or discharge of excess mucus or purulent secretions, ear ache,   fever, chills, sweats, unintended wt loss or wt gain, classically pleuritic or exertional cp,  orthopnea pnd or arm/hand swelling  or leg swelling, presyncope, palpitations, abdominal pain, anorexia, nausea, vomiting, diarrhea  or change in bowel habits or change in bladder habits, change in stools or change in urine, dysuria, hematuria,  rash, arthralgias, visual complaints, headache, numbness, weakness or ataxia or problems with walking or coordination,  change in mood or  memory.        Current Meds  Medication Sig  . acetaminophen  (TYLENOL) 325 MG tablet Take 325 mg by mouth at bedtime as needed for moderate pain or headache.   [name] acetaminophen-codeine (TYLENOL #3) 300-30 MG tablet Take 1 tablet 1 hour prior to procedure then 1 tablet every 4 hours as needed  . albuterol (PROVENTIL HFA;VENTOLIN HFA) 108 (90 Base) MCG/ACT inhaler Inhale 2 puffs every 6 (six) hours as needed  into the lungs for wheezing or shortness of breath.  [name] ambrisentan (LETAIRIS) 10 MG tablet Take 1 tablet (10 mg total) by mouth daily.  [name] aspirin EC 81 MG EC tablet Take 1 tablet (81 mg total) by mouth daily.  . clotrimazole (MYCELEX) 10 MG troche Take 1 tablet (10 mg total) by mouth 5 (five) times daily.  . fluticasone (FLONASE) 50 MCG/ACT nasal spray SHAKE WELL AND INSTILL 2 SPRAYS INTO EACH NOSTRIL ONCE DAILY  . Fluticasone-Umeclidin-Vilant (TRELEGY ELLIPTA) 100-62.5-25 MCG/INH AEPB Inhale 1 puff daily into the lungs.  . furosemide (LASIX) 40 MG tablet TAKE 1 TABLET BY MOUTH TWICE A DAY  . ipratropium (ATROVENT) 0.06 % nasal spray Place 2 sprays into both nostrils 2 (two) times daily.  . OXYGEN 2 to 2lpm 24/7  . Polyethyl Glycol-Propyl Glycol (SYSTANE OP) Apply 1-2 drops to eye daily as needed (dry eyes).  [name] spironolactone (ALDACTONE) 25 MG tablet Take 0.5 tablets (12.5 mg total) by mouth daily.  . tadalafil, PAH, (ADCIRCA) 20 MG tablet Take 2 tablets (40 mg total) by mouth daily.  .                                Objective:   Physical Exam  04/06/2014        154 > 12/06/2015  148  > 08/28/2016   145 > 07/07/2017  142>07/17/2017  141> 04/26/2018   140    03/09/14 147 lb (66.679 kg)  02/22/14 150 lb 3.2 oz (68.13 kg)  02/22/14 147 lb 8 oz (66.906 kg)     Vital signs reviewed - Note on arrival 02 sats  91% on 4lpm    Hoarse chronically ill wf nad - affect brighter today    HEENT: nl dentition / oropharynx x for mild thrush. Nl external ear canals without cough reflex -  Mild bilateral non-specific turbinate edema     NECK :   without JVD/Nodes/TM/ nl carotid upstrokes bilaterally   LUNGS: no acc muscle use,  Mild barrel  contour chest wall with bilateral  Distant bs s audible wheeze and dullness R > L base.  CV:  RRR  no s3 or murmur or increase in P2, and no edema   ABD:  soft and nontender with pos late  insp Hoover's  in the supine position. No bruits or organomegaly appreciated, bowel sounds nl  MS:   Nl gait/  ext warm without deformities, calf tenderness, cyanosis or clubbing No obvious joint restrictions   SKIN: warm and dry without lesions    NEURO:  alert, approp, nl sensorium with  no motor or cerebellar deficits apparent.            I personally reviewed images and agree with radiology impression as follows:  CXR:   12/22/17 1. Stable cardiomegaly. Mild pulmonary venous congestion and bilateral pleural effusions. Similar findings noted on prior exam.  2. Oblique linear density over the right lung base most likely related to atelectasis/infiltrate and pleural effusion. No definite pneumothorax noted. Follow-up chest x-ray suggested to demonstrate stability or clearing and to exclude hydropneumothorax.                          Assessment & Plan:

## 2018-04-26 NOTE — Patient Instructions (Addendum)
Ok to try proaire 15 min before you exercise   Work on inhaler technique:  relax and gently blow all the way out then take a nice smooth deep breath back in, triggering the inhaler at same time you start breathing in.  Hold for up to 5 seconds if you can. Blow out trelegy  thru nose. Rinse and gargle with water when done.  Clotrimazole 10 mg troche up to 4 x daily as needed   Please schedule a follow up visit in 3 months but call sooner if needed - bring your drug formulary with you if there are any issues related to your medications so we can help you choose the least expensive

## 2018-04-27 ENCOUNTER — Encounter: Payer: Self-pay | Admitting: Internal Medicine

## 2018-04-27 NOTE — Assessment & Plan Note (Signed)
Discharged   on 02  10/09/15 p admit  - HCO3  On 01/26/17  = 32  - HC03   On 07/20/2017   = 34  - HC03   On 09/26/17         = 34   As of 04/26/2018 rx 3lpm rest and up to 5 lpm with activity    Though somewhat paradoxic, when the lung fails to clear C02 properly and pC02 rises the lung then becomes a more efficient scavenger of C02 allowing lower work of breathing and  better C02 clearance albeit at a higher serum pC02 level - this is why pts can look a lot better than their ABG's would suggest and why it's so difficult to prognosticate endstage dz.  It's also why I strongly rec DNI status (ventilating pts down to a nl pC02 adversely affects this compensatory mechanism)     I had an extended discussion with the patient/ fm  reviewing all relevant studies completed to date and  lasting 15 to 20 minutes of a 25 minute visit    See device teaching which extended face to face time for this visit.  Each maintenance medication was reviewed in detail including emphasizing most importantly the difference between maintenance and prns and under what circumstances the prns are to be triggered using an action plan format that is not reflected in the computer generated alphabetically organized AVS which I have not found useful in most complex patients, especially with respiratory illnesses  Please see AVS for specific instructions unique to this visit that I personally wrote and verbalized to the the pt in detail and then reviewed with pt  by my nurse highlighting any  changes in therapy recommended at today's visit to their plan of care.

## 2018-04-27 NOTE — Assessment & Plan Note (Signed)
New dx 07/07/2017 >   thoracentesis  07/15/2017   X 580 cc with wbc's 1974 with L >> P .  Prot 3.5 , nl glucose   Alb 1.9  cyt neg with pred of T lymphocytes  - CT 07/24/17 recurrent large R effusion >  07/29/2017   Re tap = 310 cc only (loculated by u/s)  > neg cytology > rec t surgery eval done 08/23/17 > Dr Roxan Hockey offered intervention but pt wants medical rx only for now > try increase diuretics> clinically stablized as of 04/26/2018   Adequate control on present rx, reviewed in detail with pt > no change in rx needed  > diuretics and rx of cor pulmonale per Dr Tempie Hoist

## 2018-04-27 NOTE — Assessment & Plan Note (Signed)
Quit smoking 02/2014 - PFT's  04/06/14   FEV1 1.36 (57 % ) ratio 63  p 9 % improvement from saba with DLCO  51 % corrects to 71 % for alv volume   - PFT's  10/08/2015  FEV1 0.91 (40 % ) ratio 56  p no % improvement from saba with DLCO 38 % corrects to 63 % for alv volume   - PFT's  12/06/2015  FEV1 1.17  (52 % ) ratio 62  p 16 % improvement from saba p no rx prior to study with DLCO  28 % corrects to 44 % for alv volume     - 07/17/2017   try change to trelegy > improved    Group D in terms of symptom/risk and laba/lama/ICS  therefore appropriate rx at this point> continue trelegy  - The proper method of use, as well as anticipated side effects, of a metered-dose inhaler and elipta  were discussed and demonstrated to the patient.   No change in rx needed, able to do hfa adequately to use ventolin or proair as backup for trelegy

## 2018-05-05 DIAGNOSIS — L720 Epidermal cyst: Secondary | ICD-10-CM | POA: Diagnosis not present

## 2018-05-08 DIAGNOSIS — J9601 Acute respiratory failure with hypoxia: Secondary | ICD-10-CM | POA: Diagnosis not present

## 2018-05-08 DIAGNOSIS — J45909 Unspecified asthma, uncomplicated: Secondary | ICD-10-CM | POA: Diagnosis not present

## 2018-05-08 DIAGNOSIS — J449 Chronic obstructive pulmonary disease, unspecified: Secondary | ICD-10-CM | POA: Diagnosis not present

## 2018-05-08 DIAGNOSIS — R05 Cough: Secondary | ICD-10-CM | POA: Diagnosis not present

## 2018-05-08 DIAGNOSIS — J45901 Unspecified asthma with (acute) exacerbation: Secondary | ICD-10-CM | POA: Diagnosis not present

## 2018-05-08 DIAGNOSIS — R0602 Shortness of breath: Secondary | ICD-10-CM | POA: Diagnosis not present

## 2018-05-08 DIAGNOSIS — J188 Other pneumonia, unspecified organism: Secondary | ICD-10-CM | POA: Diagnosis not present

## 2018-05-08 DIAGNOSIS — R062 Wheezing: Secondary | ICD-10-CM | POA: Diagnosis not present

## 2018-05-08 DIAGNOSIS — I10 Essential (primary) hypertension: Secondary | ICD-10-CM | POA: Diagnosis not present

## 2018-05-26 ENCOUNTER — Ambulatory Visit (HOSPITAL_COMMUNITY)
Admission: RE | Admit: 2018-05-26 | Discharge: 2018-05-26 | Disposition: A | Discharge status: Home / Self Care | Payer: Medicare HMO | Source: Ambulatory Visit | Attending: Internal Medicine | Admitting: Internal Medicine

## 2018-05-26 ENCOUNTER — Encounter (HOSPITAL_COMMUNITY): Payer: Self-pay | Admitting: Internal Medicine

## 2018-05-26 VITALS — BP 124/50 | HR 105 | Wt 140.0 lb

## 2018-05-26 DIAGNOSIS — I2510 Atherosclerotic heart disease of native coronary artery without angina pectoris: Secondary | ICD-10-CM | POA: Insufficient documentation

## 2018-05-26 DIAGNOSIS — I5032 Chronic diastolic (congestive) heart failure: Secondary | ICD-10-CM | POA: Diagnosis not present

## 2018-05-26 DIAGNOSIS — D751 Secondary polycythemia: Secondary | ICD-10-CM | POA: Insufficient documentation

## 2018-05-26 DIAGNOSIS — K7460 Unspecified cirrhosis of liver: Secondary | ICD-10-CM | POA: Insufficient documentation

## 2018-05-26 DIAGNOSIS — Z87891 Personal history of nicotine dependence: Secondary | ICD-10-CM | POA: Insufficient documentation

## 2018-05-26 DIAGNOSIS — I110 Hypertensive heart disease with heart failure: Secondary | ICD-10-CM | POA: Diagnosis present

## 2018-05-26 DIAGNOSIS — J449 Chronic obstructive pulmonary disease, unspecified: Secondary | ICD-10-CM | POA: Diagnosis not present

## 2018-05-26 DIAGNOSIS — I2729 Other secondary pulmonary hypertension: Secondary | ICD-10-CM | POA: Insufficient documentation

## 2018-05-26 DIAGNOSIS — I50810 Right heart failure, unspecified: Secondary | ICD-10-CM | POA: Diagnosis not present

## 2018-05-26 DIAGNOSIS — I2721 Secondary pulmonary arterial hypertension: Secondary | ICD-10-CM | POA: Diagnosis not present

## 2018-05-26 DIAGNOSIS — J90 Pleural effusion, not elsewhere classified: Secondary | ICD-10-CM | POA: Diagnosis not present

## 2018-05-26 DIAGNOSIS — J9611 Chronic respiratory failure with hypoxia: Secondary | ICD-10-CM | POA: Insufficient documentation

## 2018-05-26 DIAGNOSIS — Z7982 Long term (current) use of aspirin: Secondary | ICD-10-CM | POA: Diagnosis not present

## 2018-05-26 DIAGNOSIS — Z79899 Other long term (current) drug therapy: Secondary | ICD-10-CM | POA: Insufficient documentation

## 2018-05-26 LAB — COMPREHENSIVE METABOLIC PANEL
ALT: 16 U/L (ref 0–44)
ANION GAP: 10 (ref 5–15)
AST: 22 U/L (ref 15–41)
Albumin: 3.6 g/dL (ref 3.5–5.0)
Alkaline Phosphatase: 82 U/L (ref 38–126)
BILIRUBIN TOTAL: 0.7 mg/dL (ref 0.3–1.2)
BUN: 18 mg/dL (ref 8–23)
CHLORIDE: 97 mmol/L — AB (ref 98–111)
CO2: 33 mmol/L — ABNORMAL HIGH (ref 22–32)
Calcium: 9.1 mg/dL (ref 8.9–10.3)
Creatinine, Ser: 0.88 mg/dL (ref 0.44–1.00)
GFR calc Af Amer: >60 mL/min (ref >60)
Glucose, Bld: 112 mg/dL — ABNORMAL HIGH (ref 70–99)
POTASSIUM: 4 mmol/L (ref 3.5–5.1)
SODIUM: 140 mmol/L (ref 135–145)
TOTAL PROTEIN: 6.9 g/dL (ref 6.5–8.1)

## 2018-05-26 NOTE — Progress Notes (Signed)
mnnPatient ID: Linda Stevens, female   DOB: [birth year], 72 y.o.   MRN: [id number]  [facility] CLINIC NOTE  Patient ID: Linda Stevens, female   DOB: 1945-09-25, 72 y.o.   MRN: [id number] Primary Cardiologist: Dr Haroldine Laws  Pulmonary: Dr Melvyn Novas   HPI:  Linda Stevens is  72 y/o woman with a h/o HTN, COPD (former smoker quit 2015), seasonal allergies, asthma, pulmonary HTN, and diastolic dysfxn.  Admitted January 2017 secondary to progressive DOE, hypoxia, and lower ext edema and found to have severe PAH. R/LHC 10/08/15 with normal coronaries, normal CO, and severe PAH PAP 104/49 with 13.6 WU. PAH well out of proportion to left-sided filling pressures and COPD. Started revatio 20 TID with consideration for macitentan as outpatient. PFTs 10/05/15  FEV1 0.91 (40%)FVC 1.64 (55%), DLCO 8.80 (38%). Auto-immune and infectious serologies negative. VQ negative. Discharge weight 148 pounds.   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 Has been drained twice. Underwent first thoracentesis on 11/7.  Transudative. CT scan 07/24/17 showed large R effusion and small pericardial effusion. +COPD.  Underwent repeat thoracentesis on 07/29/17 with 310cc out.  Cytology negative. F/u CXR 11/21 showed small bilateral effusions.  Referred to Dr. Roxan Hockey for possible Pleurex versus VATS in 12/18 but she did not want to proceed. Ab u/s with cirrhotic features.  RF markedly ++ but other serology negative. Saw Dr. Amil Amen who said she did not have RA but was diagnosed with polyarthralgia/OA.   She presents today for regular follow up. Says she was doing very well until this past weekend. Says that over the weekend felt she had swelling in her lower back. Took 2 extra fluid pills. Weight dropped 3 pounds. Now down to 137. (Typically 135-136) No orthopnea or PND. Can do all ADLs and walks on TM with her O2. Walks on TM for 25 mins at slow pace. About 3-4x/week.    Echo 4/19 EF 65% RV normal. No effusion Personally reviewed Echo 5/18 EF 60-65% RV completely normal. No TR to measure RVSP. No effusion  Echo 3/17 EF 60-65% septum still flattened. RV size improved moderate HK. RVSP ~80. IVC small. Pericardial effusion essentially resolved   CXR 09/28/17 Small bilateral effusions  PAH regimen: 1. Adcirca 40 2. Letairis 10 (Switched to [name] as it was thought that sinusitis might be due to macitentan.)  6MW 8/17; 840 ft (256 M) on 2 L O2, sats ranged 83-95%, HR ranged 96-111.  [place] 12/18 RA = 5 RV = 56/9 PA = 52/17 (33) PCW = 10 Fick cardiac output/index = 7.5/4.5 PVR = 3.5 WU Ao sat = 96% PA sat = 73%, 72% High SVC sat = 76%  VQ scan low prob. CT chest mild COPD. Moderate pericardial effusion. 3v CAD. + cirrhosis. Hepatitis serologies negative.   PFTs  (FEV1 1.23 (52%) with FVC 2.01 (65%) and DLCO 51% in 7/15) PFT's 10/08/2015 FEV1 0.91 (40 %) ratio 56%DLCO 38 % corrects to 63 % for alv volume  PFTs  12/06/15 FEV1 1.0 (44%) FVC 1.74 (58%0 DLCO 28%  R/LHC 10/08/15 with normal coronaries, normal CO, and sever PAH with 13.6 WU.  Findings: Ao = 108/68 (86) LV = 102/10/11 RA = 9 RV = 97/8/12 PA = 104/49 (72) PCW = 18 Fick cardiac output/index = 3.96/2.34 PVR = 13.6 WU FA sat = 93% PA sat = 66%, 69%  RHC 5/18 RA = 2 RV = 48/4 PA = 45/17 (30) PCW = 8 Fick cardiac  output/index = 4.9/3.0 PVR = 4.4 WU Ao sat = 94% PA sat = 68%, 70%  Review of systems complete and found to be negative unless listed in HPI.   SH:  Social History   Socioeconomic History  . Marital status: Married    Spouse name: Not on file  . Number of children: Not on file  . Years of education: 2  . Highest education level: Not on file  Occupational History  . Occupation: retired Tour manager  . Financial resource strain: Not on file  . Food insecurity:    Worry: Not on file    Inability: Not on file  . Transportation needs:    Medical: Not  on file    Non-medical: Not on file  Tobacco Use  . Smoking status: Former Smoker    Packs/day: 1.00    Years: 40.00    Pack years: 40.00    Types: Cigarettes    Last attempt to quit: 02/22/2014    Years since quitting: 4.2  . Smokeless tobacco: Never Used  Substance and Sexual Activity  . Alcohol use: No  . Drug use: No  . Sexual activity: Never  Lifestyle  . Physical activity:    Days per week: Not on file    Minutes per session: Not on file  . Stress: Not on file  Relationships  . Social connections:    Talks on phone: Not on file    Gets together: Not on file    Attends religious service: Not on file    Active member of club or organization: Not on file    Attends meetings of clubs or organizations: Not on file    Relationship status: Not on file  . Intimate partner violence:    Fear of current or ex partner: Not on file    Emotionally abused: Not on file    Physically abused: Not on file    Forced sexual activity: Not on file  Other Topics Concern  . Not on file  Social History Narrative   Married with 2 children.  Independent of ADLs.      Does not have a living will.   Would desire CPR but would not want prolonged life support if futile- husband aware.    FH:  Family History  Problem Relation Age of Onset  . Glaucoma Brother   . Vascular Disease Father        Died of complications from surgery  . Heart attack Father   . Asthma Mother        Died of asthma attack  . Dementia Mother   . Hyperlipidemia Mother   . Hypertension Mother     Past Medical History:  Diagnosis Date  . Acute respiratory failure with hypoxia and hypercapnea 02/26/2014  . Allergic rhinitis due to pollen   . Allergy   . Arthritis   . Asthma   . CHF (congestive heart failure) ([place])   . COPD (chronic obstructive pulmonary disease) ([place])   . Diastolic dysfunction    a. 02/2014 Echo: EF 65-70%, Gr 1 DD, RVH;  b. 09/2015 Echo: EF 55%, Gr 1 DD.  [name] Emphysema of lung ([place])   . H/O  seasonal allergies   . History of chicken pox   . History of tobacco abuse    a. Quit 2015.  [name] History of UTI   . Hypertension   . Hypertensive heart disease   . Lower extremity edema   . Pericardial effusion  a. 09/2015 Echo: Mod eff w/o tamponade.  . Right heart failure with reduced right ventricular function ([place])    a. 09/2015 Echo: EF 55%, Gr 1 DD, D shaped IV septum, sev dil RV with mod reduced fxn, mod dil RA, RV-RA grad 89mHg, PASP 863mg, mod pericard eff w/o tamponade.  . Severe Pulmonary Hypertension    a. 09/2015 Echo: PASP 8721m.    Current Outpatient Medications  Medication Sig Dispense Refill  . acetaminophen (TYLENOL) 325 MG tablet Take 325 mg by mouth at bedtime as needed for moderate pain or headache.     . aMarland Kitchenetaminophen-codeine (TYLENOL #3) 300-30 MG tablet Take 1 tablet 1 hour prior to procedure then 1 tablet every 4 hours as needed 16 tablet 0  . albuterol (PROVENTIL HFA;VENTOLIN HFA) 108 (90 Base) MCG/ACT inhaler Inhale 2 puffs every 6 (six) hours as needed into the lungs for wheezing or shortness of breath. 1 Inhaler 11  . ambrisentan (LETAIRIS) 10 MG tablet Take 1 tablet (10 mg total) by mouth daily. 30 tablet 11  . aspirin EC 81 MG EC tablet Take 1 tablet (81 mg total) by mouth daily. 30 tablet 0  . clotrimazole (MYCELEX) 10 MG troche Take 1 tablet (10 mg total) by mouth 5 (five) times daily. 40 tablet 1  . fluticasone (FLONASE) 50 MCG/ACT nasal spray SHAKE WELL AND INSTILL 2 SPRAYS INTO EACH NOSTRIL ONCE DAILY 48 g 1  . Fluticasone-Umeclidin-Vilant (TRELEGY ELLIPTA) 100-62.5-25 MCG/INH AEPB Inhale 1 puff daily into the lungs. 60 each 11  . furosemide (LASIX) 40 MG tablet TAKE 1 TABLET BY MOUTH TWICE A DAY 60 tablet 3  . ipratropium (ATROVENT) 0.06 % nasal spray Place 2 sprays into both nostrils 2 (two) times daily. 45 mL 3  . OXYGEN 2 to 2lpm 24/7    . Polyethyl Glycol-Propyl Glycol (SYSTANE OP) Apply 1-2 drops to eye daily as needed (dry eyes).    .  Marland Kitchenpironolactone (ALDACTONE) 25 MG tablet Take 0.5 tablets (12.5 mg total) by mouth daily. 45 tablet 3  . tadalafil, PAH, (ADCIRCA) 20 MG tablet Take 2 tablets (40 mg total) by mouth daily. 180 tablet 3   No current facility-administered medications for this visit.     Vitals:   05/26/18 1433  BP: (!) 124/50  Pulse: (!) 105  SpO2: 90%  Weight: 63.5 kg (140 lb)   Wt Readings from Last 3 Encounters:  04/26/18 63.5 kg (140 lb)  03/16/18 62.8 kg (138 lb 8 oz)  12/21/17 61.7 kg (136 lb)     PHYSICAL EXAM: General:  Sitting in chair wearing O2. No resp difficulty HEENT: normal Neck: supple. no JVD. Carotids 2+ bilat; no bruits. No lymphadenopathy or thryomegaly appreciated. Cor: PMI nondisplaced. Regular rate & rhythm. 2/6 TR Lungs: clear with moderately decreased BS Abdomen: soft, nontender, nondistended. No hepatosplenomegaly. No bruits or masses. Good bowel sounds. Extremities: no cyanosis, clubbing, rash, edema + telangectasias  Neuro: alert & orientedx3, cranial nerves grossly intact. moves all 4 extremities w/o difficulty. Affect pleasant   ASSESSMENT & PLAN:  1. PAH with right heart failure - suspect combination of WHO Group I and III PAH - Echo 5/18 with complete resolution of RV strain. No significant TR to estimate RVSP - Echo 4/19 with EF 65% no evidence of RV strain  - Repeat RHC 12/18 with mild PAH (mean PA 33) - Doing well. NYHA II-III on. Echo with resolution of RV strain. Will continue O2, Adcirca and letaris  - Continue lasix 40 bid and  spiro 12.5. BMET today.  - Discussed eventual need to add selexipag. Will check 6MW today to further risk stratify.   2. Recurrent R pleural effusion --Given improvement in PA pressures doubt effusion related to severe PAH. Macitentan often associated with fluid retention but I have not seen it cause pleural effusions. Previous ab CT with evidence of cirrhosis.  -- Fluid analysis = transudative.  -- Effusion much improved on CXR  12/2017 (small)  -- Rheum work-up negative so far.  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 Now controlled with with diuresis  3. Chronic Respiratory failure - Multifactorial. Stable. On 2 liters oxygen - Follows with Dr. Melvyn Novas.   4. COPD- Gold III  - on inhalers. Follows with Pulmonary. As above. No change.  5. Polycythemia suspect due to chronic hypoxia  --Resolved. No change.  6. Cirrhosis on CT --due to RHF. Hepatitis serologies are negative. No change  [name], MD  10:26 PM  Addendum:   71mn walk completed.  780 ft =237.7473mers On 3L of O2  Starting O2 94% Ending O2-85%   Starting heart rate-80 Ending heart rate- 110  Likely limited due to COPD and PAH. Will consider selexipag in near future.   DaGlori BickersMD  10:27 PM

## 2018-05-26 NOTE — Patient Instructions (Signed)
Routine lab work today. Will notify you of abnormal results  Follow up in 4 months with Dr.Bensimhon.

## 2018-05-26 NOTE — Progress Notes (Signed)
78mn walk completed.  780 ft =237.7475mers On 3L of O2  Starting O2 94% Ending O2-85%   Starting heart rate-80 Ending heart rate- 110

## 2018-05-31 ENCOUNTER — Telehealth: Payer: Self-pay

## 2018-05-31 NOTE — Telephone Encounter (Signed)
Copied from [place] [id number]. Topic: Quick Communication - See Telephone Encounter >> May 31, 2018 10:24 AM Antonieta Iba C wrote: CRM for notification. See Telephone encounter for: 05/31/18.  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 Example her oxygen and oxygen level.   Juror # is [id number] and Date: Wed November 6th in [place]  [place]: [phone]

## 2018-06-04 ENCOUNTER — Encounter: Payer: Self-pay | Admitting: Family Medicine

## 2018-06-04 DIAGNOSIS — Z0279 Encounter for issue of other medical certificate: Secondary | ICD-10-CM

## 2018-06-04 NOTE — Progress Notes (Signed)
Patient Active Problem List   Diagnosis Date Noted  . Chronic respiratory failure with hypoxia and hypercapnia ([place]) 07/08/2017  . Pleural effusion on right 07/08/2017  . Chronic respiratory failure with hypoxia ([place]) 10/16/2015  . Hepatic cirrhosis ([place]) 10/15/2015  . Pulmonary artery hypertension ([place])   . Acute on chronic diastolic congestive heart failure ([place])   . Acute on chronic respiratory failure with hypoxia ([place])   . COPD GOLD II criteria but 02 dep 24/7    . Severe Pulmonary Hypertension   . Pericardial effusion   . Diastolic dysfunction   . Congestive heart failure (CHF) ([place]) 10/01/2015  . Encounter for routine gynecological examination 05/03/2014  . Mild diastolic dysfunction 80/32/1224  . HTN (hypertension) 03/19/2012  . Allergic rhinitis 01/15/2007

## 2018-06-04 NOTE — Telephone Encounter (Signed)
Letter printed and signed.

## 2018-06-04 NOTE — Telephone Encounter (Signed)
Pt calling to f/up on this request.

## 2018-06-07 DIAGNOSIS — J45901 Unspecified asthma with (acute) exacerbation: Secondary | ICD-10-CM | POA: Diagnosis not present

## 2018-06-07 DIAGNOSIS — J45909 Unspecified asthma, uncomplicated: Secondary | ICD-10-CM | POA: Diagnosis not present

## 2018-06-07 DIAGNOSIS — R05 Cough: Secondary | ICD-10-CM | POA: Diagnosis not present

## 2018-06-07 DIAGNOSIS — R062 Wheezing: Secondary | ICD-10-CM | POA: Diagnosis not present

## 2018-06-07 DIAGNOSIS — J9601 Acute respiratory failure with hypoxia: Secondary | ICD-10-CM | POA: Diagnosis not present

## 2018-06-07 DIAGNOSIS — J188 Other pneumonia, unspecified organism: Secondary | ICD-10-CM | POA: Diagnosis not present

## 2018-06-07 DIAGNOSIS — J449 Chronic obstructive pulmonary disease, unspecified: Secondary | ICD-10-CM | POA: Diagnosis not present

## 2018-06-07 DIAGNOSIS — R0602 Shortness of breath: Secondary | ICD-10-CM | POA: Diagnosis not present

## 2018-06-07 DIAGNOSIS — I10 Essential (primary) hypertension: Secondary | ICD-10-CM | POA: Diagnosis not present

## 2018-06-07 NOTE — Telephone Encounter (Signed)
Desaray notified that her jury letter is ready to be picked up at the front desk.

## 2018-06-11 ENCOUNTER — Other Ambulatory Visit (HOSPITAL_COMMUNITY): Payer: Self-pay | Admitting: Internal Medicine

## 2018-07-08 DIAGNOSIS — J188 Other pneumonia, unspecified organism: Secondary | ICD-10-CM | POA: Diagnosis not present

## 2018-07-08 DIAGNOSIS — R062 Wheezing: Secondary | ICD-10-CM | POA: Diagnosis not present

## 2018-07-08 DIAGNOSIS — J45901 Unspecified asthma with (acute) exacerbation: Secondary | ICD-10-CM | POA: Diagnosis not present

## 2018-07-08 DIAGNOSIS — R0602 Shortness of breath: Secondary | ICD-10-CM | POA: Diagnosis not present

## 2018-07-08 DIAGNOSIS — J449 Chronic obstructive pulmonary disease, unspecified: Secondary | ICD-10-CM | POA: Diagnosis not present

## 2018-07-08 DIAGNOSIS — J9601 Acute respiratory failure with hypoxia: Secondary | ICD-10-CM | POA: Diagnosis not present

## 2018-07-08 DIAGNOSIS — J45909 Unspecified asthma, uncomplicated: Secondary | ICD-10-CM | POA: Diagnosis not present

## 2018-07-08 DIAGNOSIS — I10 Essential (primary) hypertension: Secondary | ICD-10-CM | POA: Diagnosis not present

## 2018-07-08 DIAGNOSIS — R05 Cough: Secondary | ICD-10-CM | POA: Diagnosis not present

## 2018-07-19 ENCOUNTER — Telehealth: Payer: Self-pay | Admitting: Family Medicine

## 2018-07-19 ENCOUNTER — Ambulatory Visit (INDEPENDENT_AMBULATORY_CARE_PROVIDER_SITE_OTHER): Payer: Medicare HMO

## 2018-07-19 VITALS — BP 122/66 | HR 94 | Temp 97.5°F | Ht 63.5 in | Wt 139.5 lb

## 2018-07-19 DIAGNOSIS — Z0000 Encounter for general adult medical examination without abnormal findings: Secondary | ICD-10-CM

## 2018-07-19 DIAGNOSIS — I10 Essential (primary) hypertension: Secondary | ICD-10-CM

## 2018-07-19 DIAGNOSIS — Z23 Encounter for immunization: Secondary | ICD-10-CM | POA: Diagnosis not present

## 2018-07-19 LAB — COMPREHENSIVE METABOLIC PANEL
ALBUMIN: 4 g/dL (ref 3.5–5.2)
ALT: 9 U/L (ref 0–35)
AST: 17 U/L (ref 0–37)
Alkaline Phosphatase: 58 U/L (ref 39–117)
BUN: 14 mg/dL (ref 6–23)
CALCIUM: 9.6 mg/dL (ref 8.4–10.5)
CHLORIDE: 97 meq/L (ref 96–112)
CO2: 37 meq/L — AB (ref 19–32)
Creatinine, Ser: 0.78 mg/dL (ref 0.40–1.20)
GFR: 77.55 mL/min (ref >60.00)
Glucose, Bld: 98 mg/dL (ref 70–99)
POTASSIUM: 3.6 meq/L (ref 3.5–5.1)
SODIUM: 141 meq/L (ref 135–145)
Total Bilirubin: 0.6 mg/dL (ref 0.2–1.2)
Total Protein: 7.3 g/dL (ref 6.0–8.3)

## 2018-07-19 LAB — LIPID PANEL
CHOLESTEROL: 181 mg/dL (ref 0–200)
HDL: 60.1 mg/dL (ref >39.00)
LDL CALC: 96 mg/dL (ref 0–99)
NonHDL: 120.41
TRIGLYCERIDES: 122 mg/dL (ref 0.0–149.0)
Total CHOL/HDL Ratio: 3
VLDL: 24.4 mg/dL (ref 0.0–40.0)

## 2018-07-19 NOTE — Progress Notes (Signed)
Subjective:   Linda Stevens is a 72 y.o. female who presents for Medicare Annual (Subsequent) preventive examination.  Review of Systems:  N/A Cardiac Risk Factors include: advanced age (>88mn, >>62women);hypertension     Objective:     Vitals: BP 122/66 (BP Location: Left Arm, Patient Position: Sitting, Cuff Size: Normal)   Pulse 94   Temp (!) 97.5 F (36.4 C) (Oral)   Ht 5' 3.5" (1.613 m) Comment: shoes  Wt 139 lb 8 oz (63.3 kg)   SpO2 91%   BMI 24.32 kg/m   Body mass index is 24.32 kg/m.  Advanced Directives 07/19/2018 08/12/2017 01/26/2017 10/16/2015 10/02/2015 10/01/2015 02/22/2014  Does Patient Have a Medical Advance Directive? Yes Yes Yes Yes No No Patient does not have advance directive;Patient would not like information  Type of AScientist, forensicPower of AWest PointLiving will HDexterLiving will HBaldwin CityLiving will - - - -  Copy of [place] Chart? No - copy requested Yes - - - - -  Would patient like information on creating a medical advance directive? - - - - No - patient declined information No - patient declined information -  Pre-existing out of facility DNR order (yellow form or pink MOST form) - - - - - - No    Tobacco Social History   Tobacco Use  Smoking Status Former Smoker  . Packs/day: 1.00  . Years: 40.00  . Pack years: 40.00  . Types: Cigarettes  . Last attempt to quit: 02/22/2014  . Years since quitting: 4.4  Smokeless Tobacco Never Used     Counseling given: No   Clinical Intake:  Pre-visit preparation completed: Yes  Pain : No/denies pain Pain Score: 0-No pain     Nutritional Status: BMI 25 -29 Overweight Nutritional Risks: None Diabetes: No  How often do you need to have someone help you when you read instructions, pamphlets, or other written materials from your doctor or pharmacy?: 1 - Never What is the last grade level you completed in school?: 12th  grade  Interpreter Needed?: No  Comments: pt lives with spouse Information entered by :: LPinson, LPN  Past Medical History:  Diagnosis Date  . Acute respiratory failure with hypoxia and hypercapnea 02/26/2014  . Allergic rhinitis due to pollen   . Allergy   . Arthritis   . Asthma   . CHF (congestive heart failure) ([place]   . COPD (chronic obstructive pulmonary disease) ([place]   . Diastolic dysfunction    a. 02/2014 Echo: EF 65-70%, Gr 1 DD, RVH;  b. 09/2015 Echo: EF 55%, Gr 1 DD.  .Marland KitchenEmphysema of lung (HBaden   . H/O seasonal allergies   . History of chicken pox   . History of tobacco abuse    a. Quit 2015.  .Marland KitchenHistory of UTI   . Hypertension   . Hypertensive heart disease   . Lower extremity edema   . Pericardial effusion    a. 09/2015 Echo: Mod eff w/o tamponade.  . Right heart failure with reduced right ventricular function ([place]    a. 09/2015 Echo: EF 55%, Gr 1 DD, D shaped IV septum, sev dil RV with mod reduced fxn, mod dil RA, RV-RA grad 768mg, PASP 8711m, mod pericard eff w/o tamponade.  . Severe Pulmonary Hypertension    a. 09/2015 Echo: PASP 39m24m   Past Surgical History:  Procedure Laterality Date  . CARDIAC CATHETERIZATION N/A 10/08/2015   Procedure: Right/Left Heart  Cath and Coronary Angiography;  Surgeon: Jolaine Artist, MD;  Location: [facility];  Service: Cardiovascular;  Laterality: N/A;  . CHOLECYSTECTOMY     Open  . RIGHT HEART CATH N/A 01/26/2017   Procedure: Right Heart Cath;  Surgeon: Jolaine Artist, MD;  Location: [facility];  Service: Cardiovascular;  Laterality: N/A;  . RIGHT HEART CATH N/A 08/12/2017   Procedure: RIGHT HEART CATH;  Surgeon: Jolaine Artist, MD;  Location: [facility];  Service: Cardiovascular;  Laterality: N/A;   Family History  Problem Relation Age of Onset  . Glaucoma Brother   . Vascular Disease Father        Died of complications from surgery  . Heart attack Father   . Asthma Mother        Died  of asthma attack  . Dementia Mother   . Hyperlipidemia Mother   . Hypertension Mother    Social History   Socioeconomic History  . Marital status: Married    Spouse name: Not on file  . Number of children: Not on file  . Years of education: 2  . Highest education level: Not on file  Occupational History  . Occupation: retired Tour manager  . Financial resource strain: Not on file  . Food insecurity:    Worry: Not on file    Inability: Not on file  . Transportation needs:    Medical: Not on file    Non-medical: Not on file  Tobacco Use  . Smoking status: Former Smoker    Packs/day: 1.00    Years: 40.00    Pack years: 40.00    Types: Cigarettes    Last attempt to quit: 02/22/2014    Years since quitting: 4.4  . Smokeless tobacco: Never Used  Substance and Sexual Activity  . Alcohol use: No  . Drug use: No  . Sexual activity: Never  Lifestyle  . Physical activity:    Days per week: Not on file    Minutes per session: Not on file  . Stress: Not on file  Relationships  . Social connections:    Talks on phone: Not on file    Gets together: Not on file    Attends religious service: Not on file    Active member of club or organization: Not on file    Attends meetings of clubs or organizations: Not on file    Relationship status: Not on file  Other Topics Concern  . Not on file  Social History Narrative   Married with 2 children.  Independent of ADLs.      Does not have a living will.   Would desire CPR but would not want prolonged life support if futile- husband aware.    Outpatient Encounter Medications as of 07/19/2018  Medication Sig  . acetaminophen (TYLENOL) 325 MG tablet Take 325 mg by mouth at bedtime as needed for moderate pain or headache.   [name] acetaminophen-codeine (TYLENOL #3) 300-30 MG tablet Take 1 tablet 1 hour prior to procedure then 1 tablet every 4 hours as needed  . albuterol (PROVENTIL HFA;VENTOLIN HFA) 108 (90 Base) MCG/ACT inhaler  Inhale 2 puffs every 6 (six) hours as needed into the lungs for wheezing or shortness of breath.  [name] ambrisentan (LETAIRIS) 10 MG tablet Take 1 tablet (10 mg total) by mouth daily.  [name] aspirin EC 81 MG EC tablet Take 1 tablet (81 mg total) by mouth daily.  . clotrimazole (MYCELEX) 10  MG troche Take 1 tablet (10 mg total) by mouth 5 (five) times daily.  . fluticasone (FLONASE) 50 MCG/ACT nasal spray SHAKE WELL AND INSTILL 2 SPRAYS INTO EACH NOSTRIL ONCE DAILY  . Fluticasone-Umeclidin-Vilant (TRELEGY ELLIPTA) 100-62.5-25 MCG/INH AEPB Inhale 1 puff daily into the lungs.  . furosemide (LASIX) 40 MG tablet TAKE 1 TABLET BY MOUTH TWICE A DAY  . ipratropium (ATROVENT) 0.06 % nasal spray Place 2 sprays into both nostrils 2 (two) times daily.  . OXYGEN 2 to 2lpm 24/7  . Polyethyl Glycol-Propyl Glycol (SYSTANE OP) Apply 1-2 drops to eye daily as needed (dry eyes).  [name] spironolactone (ALDACTONE) 25 MG tablet Take 0.5 tablets (12.5 mg total) by mouth daily.  . tadalafil, PAH, (ADCIRCA) 20 MG tablet Take 2 tablets (40 mg total) by mouth daily.   No facility-administered encounter medications on file as of 07/19/2018.     Activities of Daily Living In your present state of health, do you have any difficulty performing the following activities: 07/19/2018 08/12/2017  Hearing? N N  Vision? N N  Difficulty concentrating or making decisions? N N  Walking or climbing stairs? Y Y  Dressing or bathing? N N  Doing errands, shopping? N -  Preparing Food and eating ? Y -  Using the Toilet? N -  In the past six months, have you accidently leaked urine? N -  Do you have problems with loss of bowel control? N -  Managing your Medications? N -  Managing your Finances? N -  Housekeeping or managing your Housekeeping? Y -  Some recent data might be hidden    Patient Care Team: [name], MD as PCP - General (Family Medicine) Tanda Rockers, MD as Consulting Physician (Pulmonary Disease) Rutherford Guys, MD as  Consulting Physician (Ophthalmology)    Assessment:   This is a routine wellness examination for Abigayl.   Hearing Screening   _0  _1  _2  _3  _4  _5  _6  _7  _8   Right ear:   40 40 40  0    Left ear:   40 40 40  0      Visual Acuity Screening   Right eye Left eye Both eyes  Without correction: 20/50-1 20/40-1 20/40  With correction:       Exercise Activities and Dietary recommendations Current Exercise Habits: Home exercise routine, Type of exercise: treadmill, Time (Minutes): 20, Frequency (Times/Week): 3, Weekly Exercise (Minutes/Week): 60, Intensity: Mild, Exercise limited by: respiratory conditions(s)  Goals    . Patient Stated     Starting 07/19/2018, I will continue to take medications as prescribed.        Fall Risk Fall Risk  07/19/2018  Falls in the past year? 0   Depression Screen PHQ 2/9 Scores 07/19/2018  PHQ - 2 Score 0  PHQ- 9 Score 0     Cognitive Function MMSE - Mini Mental State Exam 07/19/2018  Orientation to time 5  Orientation to Place 5  Registration 3  Attention/ Calculation 0  Recall 3  Language- name 2 objects 0  Language- repeat 1  Language- follow 3 step command 3  Language- read & follow direction 0  Write a sentence 0  Copy design 0  Total score 20     PLEASE NOTE: A Mini-Cog screen was completed. Maximum score is 20. A value of 0 denotes this part of [place] MMSE was not completed or the patient failed this part of the Mini-Cog screening.   Mini-Cog Screening Orientation to Time - Max  5 pts Orientation to Place - Max 5 pts Registration - Max 3 pts Recall - Max 3 pts Language Repeat - Max 1 pts Language Follow 3 Step Command - Max 3 pts     Immunization History  Administered Date(s) Administered  . Influenza,inj,Quad PF,6+ Mos 05/03/2014, 08/21/2015, 07/08/2016, 06/05/2017, 07/19/2018  . Pneumococcal Conjugate-13 01/17/2016  . Pneumococcal Polysaccharide-23 04/19/2014    Screening  Tests Health Maintenance  Topic Date Due  . COLON CANCER SCREENING ANNUAL FOBT  09/07/2018 (Originally 05/10/2015)  . MAMMOGRAM  09/08/2019 (Originally 05/09/2017)  . TETANUS/TDAP  09/08/2019 (Originally 04/30/1967)  . COLONOSCOPY  09/07/2049 (Originally 04/29/1998)  . INFLUENZA VACCINE  Completed  . DEXA SCAN  Completed  . Hepatitis C Screening  Completed  . PNA vac Low Risk Adult  Completed      Plan:     I have personally reviewed, addressed, and noted the following in the patient's chart:  A. Medical and social history B. Use of alcohol, tobacco or illicit drugs  C. Current medications and supplements D. Functional ability and status E.  Nutritional status F.  Physical activity G. Advance directives H. List of other physicians I.  Hospitalizations, surgeries, and ER visits in previous 12 months J.  [place] to include hearing, vision, cognitive, depression L. Referrals and appointments - none  In addition, I have reviewed and discussed with patient certain preventive protocols, quality metrics, and best practice recommendations. A written personalized care plan for preventive services as well as general preventive health recommendations were provided to patient.  See attached scanned questionnaire for additional information.   Signed,   Lindell Noe, MHA, BS, LPN Health Coach

## 2018-07-19 NOTE — Progress Notes (Signed)
PCP notes:   Health maintenance:  Colon cancer screening - PCP follow-up requested Mammogram - PCP follow-up requested Tetanus vaccine - postponed/insurance Flu vaccine - administered  Abnormal screenings:   Hearing - failed  Patient concerns:   None  Nurse concerns:  None  Next PCP appt:   07/23/18 @ 1430

## 2018-07-19 NOTE — Telephone Encounter (Signed)
-----  Message from Eustace Pen, LPN sent at 89/04/4209  2:04 PM EST ----- Regarding: Labs 11/11 Lab orders needed. Thank you.  Insurance:  [employer]

## 2018-07-19 NOTE — Patient Instructions (Signed)
Linda Stevens , Thank you for taking time to come for your Medicare Wellness Visit. I appreciate your ongoing commitment to your health goals. Please review the following plan we discussed and let me know if I can assist you in the future.   These are the goals we discussed: Goals    . Patient Stated     Starting 07/19/2018, I will continue to take medications as prescribed.        This is a list of the screening recommended for you and due dates:  Health Maintenance  Topic Date Due  . Stool Blood Test  09/07/2018*  . Mammogram  09/08/2019*  . Tetanus Vaccine  09/08/2019*  . Colon Cancer Screening  09/07/2049*  . Flu Shot  Completed  . DEXA scan (bone density measurement)  Completed  .  Hepatitis C: One time screening is recommended by Center for Disease Control  (CDC) for  adults born from [birth year] through 1963.   Completed  . Pneumonia vaccines  Completed  *Topic was postponed. The date shown is not the original due date.   Preventive Care for Adults  A healthy lifestyle and preventive care can promote health and wellness. Preventive health guidelines for adults include the following key practices.  . A routine yearly physical is a good way to check with your health care provider about your health and preventive screening. It is a chance to share any concerns and updates on your health and to receive a thorough exam.  . Visit your dentist for a routine exam and preventive care every 6 months. Brush your teeth twice a day and floss once a day. Good oral hygiene prevents tooth decay and gum disease.  . The frequency of eye exams is based on your age, health, family medical history, use  of contact lenses, and other factors. Follow your health care provider's recommendations for frequency of eye exams.  . Eat a healthy diet. Foods like vegetables, fruits, whole grains, low-fat dairy products, and lean protein foods contain the nutrients you need without too many calories. Decrease your  intake of foods high in solid fats, added sugars, and salt. Eat the right amount of calories for you. Get information about a proper diet from your health care provider, if necessary.  . Regular physical exercise is one of the most important things you can do for your health. Most adults should get at least 150 minutes of moderate-intensity exercise (any activity that increases your heart rate and causes you to sweat) each week. In addition, most adults need muscle-strengthening exercises on 2 or more days a week.  Silver Sneakers may be a benefit available to you. To determine eligibility, you may visit the website: www.silversneakers.com or contact program at 878-246-0616 Mon-Fri between 8AM-8PM.   . Maintain a healthy weight. The body mass index (BMI) is a screening tool to identify possible weight problems. It provides an estimate of body fat based on height and weight. Your health care provider can find your BMI and can help you achieve or maintain a healthy weight.   For adults 20 years and older: ? A BMI below 18.5 is considered underweight. ? A BMI of 18.5 to 24.9 is normal. ? A BMI of 25 to 29.9 is considered overweight. ? A BMI of 30 and above is considered obese.   . Maintain normal blood lipids and cholesterol levels by exercising and minimizing your intake of saturated fat. Eat a balanced diet with plenty of fruit and vegetables. Blood tests  for lipids and cholesterol should begin at age 85 and be repeated every 5 years. If your lipid or cholesterol levels are high, you are over 50, or you are at high risk for heart disease, you may need your cholesterol levels checked more frequently. Ongoing high lipid and cholesterol levels should be treated with medicines if diet and exercise are not working.  . If you smoke, find out from your health care provider how to quit. If you do not use tobacco, please do not start.  . If you choose to drink alcohol, please do not consume more than 2  drinks per day. One drink is considered to be 12 ounces (355 mL) of beer, 5 ounces (148 mL) of wine, or 1.5 ounces (44 mL) of liquor.  . If you are 33-59 years old, ask your health care provider if you should take aspirin to prevent strokes.  . Use sunscreen. Apply sunscreen liberally and repeatedly throughout the day. You should seek shade when your shadow is shorter than you. Protect yourself by wearing long sleeves, pants, a wide-brimmed hat, and sunglasses year round, whenever you are outdoors.  . Once a month, do a whole body skin exam, using a mirror to look at the skin on your back. Tell your health care provider of new moles, moles that have irregular borders, moles that are larger than a pencil eraser, or moles that have changed in shape or color.

## 2018-07-20 ENCOUNTER — Telehealth: Payer: Self-pay | Admitting: Internal Medicine

## 2018-07-20 MED ORDER — FLUTICASONE-UMECLIDIN-VILANT 100-62.5-25 MCG/INH IN AEPB: 1.0000 | INHALATION_SPRAY | Freq: Every day | RESPIRATORY_TRACT | 3 refills | Status: DC

## 2018-07-20 NOTE — Progress Notes (Signed)
   Subjective:    Patient ID: Linda Stevens, female    DOB: 14-Mar-1946, 72 y.o.   MRN: [id number]  HPI I reviewed health advisor's note, was available for consultation, and agree with documentation and plan.    Review of Systems     Objective:   Physical Exam         Assessment & Plan:

## 2018-07-20 NOTE — Progress Notes (Signed)
I reviewed health advisor's note, was available for consultation, and agree with documentation and plan.

## 2018-07-20 NOTE — Telephone Encounter (Signed)
Called and spoke to patient, informed patient that her refill has been sent to [facility] on [address]. Voiced understanding. Nothing further is needed at this time.

## 2018-07-21 ENCOUNTER — Telehealth: Payer: Self-pay | Admitting: Family Medicine

## 2018-07-21 NOTE — Telephone Encounter (Signed)
Noted.

## 2018-07-21 NOTE — Telephone Encounter (Signed)
Linda Stevens/Linda Stevens called office stating the pt wanted to get a tetanus shot. Linda Stevens provided 3 procedure codes for the shot that they cover for the pt. Procedure codes are as followed: 37955, [id number], [phone]

## 2018-07-21 NOTE — Telephone Encounter (Signed)
Spoke with Linda Stevens and advised we got a call from her insurance and they state they will cover the Td not Tdap.  She is scheduled to see Dr. Diona Browner on Friday for her CPE and will discuss this further with Dr. Diona Browner at her visit.

## 2018-07-22 ENCOUNTER — Other Ambulatory Visit (HOSPITAL_COMMUNITY): Payer: Self-pay

## 2018-07-22 MED ORDER — AMBRISENTAN 10 MG PO TABS: 10.0000 mg | ORAL_TABLET | Freq: Every day | ORAL | 6 refills | Status: DC

## 2018-07-23 ENCOUNTER — Ambulatory Visit (INDEPENDENT_AMBULATORY_CARE_PROVIDER_SITE_OTHER): Payer: Medicare HMO | Admitting: Family Medicine

## 2018-07-23 ENCOUNTER — Encounter: Payer: Self-pay | Admitting: Family Medicine

## 2018-07-23 VITALS — BP 138/70 | HR 106 | Temp 97.9°F | Resp 18 | Ht 63.5 in | Wt 140.0 lb

## 2018-07-23 DIAGNOSIS — K7460 Unspecified cirrhosis of liver: Secondary | ICD-10-CM

## 2018-07-23 DIAGNOSIS — Z23 Encounter for immunization: Secondary | ICD-10-CM | POA: Diagnosis not present

## 2018-07-23 DIAGNOSIS — J90 Pleural effusion, not elsewhere classified: Secondary | ICD-10-CM

## 2018-07-23 DIAGNOSIS — Z1211 Encounter for screening for malignant neoplasm of colon: Secondary | ICD-10-CM | POA: Diagnosis not present

## 2018-07-23 DIAGNOSIS — I10 Essential (primary) hypertension: Secondary | ICD-10-CM

## 2018-07-23 DIAGNOSIS — Z0000 Encounter for general adult medical examination without abnormal findings: Secondary | ICD-10-CM | POA: Diagnosis not present

## 2018-07-23 DIAGNOSIS — I509 Heart failure, unspecified: Secondary | ICD-10-CM | POA: Diagnosis not present

## 2018-07-23 DIAGNOSIS — I2721 Secondary pulmonary arterial hypertension: Secondary | ICD-10-CM | POA: Diagnosis not present

## 2018-07-23 DIAGNOSIS — J449 Chronic obstructive pulmonary disease, unspecified: Secondary | ICD-10-CM

## 2018-07-23 NOTE — Assessment & Plan Note (Signed)
Euvolemic today.

## 2018-07-23 NOTE — Patient Instructions (Addendum)
Consider mamogram.  Stop at lab to pick up stool testing.

## 2018-07-23 NOTE — Assessment & Plan Note (Signed)
Negative hepatitis serology.

## 2018-07-23 NOTE — Progress Notes (Signed)
Subjective:    Patient ID: Linda Stevens, female    DOB: 27-Oct-1945, 72 y.o.   MRN: [id number]  HPI  The patient presents forcomplete physical and review of chronic health problems. He/She also has the following acute concerns today:none  The patient saw Candis Musa, LPN for medicare wellness. Note reviewed in detail and important notes copied below.  Health maintenance:  Colon cancer screening - PCP follow-up requested Mammogram - PCP follow-up requested Tetanus vaccine - postponed/insurance Flu vaccine - administered  Abnormal screenings:  Hearing - failed Patient concerns:  None Nurse concerns: None  07/23/18  Today:  I have not seen pt in 2 years  Hypertension:   Has some white coat HTn.  BP Readings from Last 3 Encounters:  07/23/18 138/70  07/19/18 122/66  05/26/18 (!) 124/50  Using medication without problems or lightheadedness:  none Chest pain with exertion: none Edema: none Short of breath: stable Average home BPs: 120/80 Other issues:  CHF and pulmonary artery hypertension with right heart failure: on O2, adcirca, letaris, spironolactone and lasix followed by Cardiology  reviewed last OV 05/2018 Dr. [facility] Pedro  Recurrent R pleural effusion:  Neg rheum eval. Dr. Hay[facility] Pedro felt possible component of hepatic hydrothorax, now controlled with diuresis  COPD: followed by pulmonary, Stable Has follow up with Pulmonary in 12.2019  She is trying to get back to walking on treadmill. Now walking slowly 3-4 times a week.  RF markedly ++ but other serology negative. Saw Dr. Amil Amen who said she did not have RA but was diagnosed with polyarthralgia/OA.   Hepatic cirrhosis: hepatitis serologies nml.  Social History /Family History/Past Medical History reviewed in detail and updated in EMR if needed. Blood pressure 138/70, pulse (!) 106, temperature 97.9 F (36.6 C), temperature source Oral, resp. rate 18, height 5' 3.5" (1.613 m), weight 140 lb  (63.5 kg), SpO2 91 %.   She gets anxious at MD.. O2 sat back up to 94-95% HR now longer   Review of Systems  Constitutional: Negative for fatigue and fever.  HENT: Negative for congestion.   Eyes: Negative for pain.  Respiratory: Positive for shortness of breath. Negative for cough.   Cardiovascular: Negative for chest pain, palpitations and leg swelling.  Gastrointestinal: Negative for abdominal pain.  Genitourinary: Negative for dysuria and vaginal bleeding.  Musculoskeletal: Negative for back pain.  Neurological: Negative for syncope, light-headedness and headaches.  Psychiatric/Behavioral: Negative for dysphoric mood.       Objective:   Physical Exam  Constitutional: Vital signs are normal. She appears well-developed and well-nourished. She is cooperative.  Non-toxic appearance. She does not appear ill. No distress.  On oxygen  HENT:  Head: Normocephalic.  Right Ear: Hearing, tympanic membrane, external ear and ear canal normal.  Left Ear: Hearing, tympanic membrane, external ear and ear canal normal.  Nose: Nose normal.  Eyes: Pupils are equal, round, and reactive to light. Conjunctivae, EOM and lids are normal. Lids are everted and swept, no foreign bodies found.  Neck: Trachea normal and normal range of motion. Neck supple. Carotid bruit is not present. No thyroid mass and no thyromegaly present.  Cardiovascular: Normal rate, regular rhythm, S1 normal, S2 normal and intact distal pulses. Exam reveals no gallop.  Murmur heard.  No peripheral edema  Pulmonary/Chest: Effort normal and breath sounds normal. No respiratory distress. She has no wheezes. She has no rhonchi. She has no rales.  Abdominal: Soft. Normal appearance and bowel sounds are normal. She exhibits no distension, no fluid wave,  no abdominal bruit and no mass. There is no hepatosplenomegaly. There is no tenderness. There is no rebound, no guarding and no CVA tenderness. No hernia.  Lymphadenopathy:    She has no  cervical adenopathy.    She has no axillary adenopathy.  Neurological: She is alert. She has normal strength. No cranial nerve deficit or sensory deficit.  Skin: Skin is warm, dry and intact. No rash noted.  Psychiatric: Her speech is normal and behavior is normal. Judgment normal. Her mood appears not anxious. Cognition and memory are normal. She does not exhibit a depressed mood.          Assessment & Plan:  The patient's preventative maintenance and recommended screening tests for an annual wellness exam were reviewed in full today. Brought up to date unless services declined.  Counselled on the importance of diet, exercise, and its role in overall health and mortality. The patient's FH and SH was reviewed, including their home life, tobacco status, and drug and alcohol status.   Vaccines: Uptodate with flu, PNA 13, 23,  Given td Pap/DVE:  Pap not indicated, no family hx of uterine, ovarian CA.Marland KitchenAsymptomatic. Mammo:  Pt hesitant to do mammogram because of cyst in elft axillae. Bone Density: 2015 osteopenia Colon:  IFOB  yearly Smoking Status: none ETOH/ drug use: none/none  Hep C:  done  HIV screen:

## 2018-07-23 NOTE — Assessment & Plan Note (Addendum)
Followed by pulmonary 

## 2018-07-23 NOTE — Assessment & Plan Note (Signed)
Followed by cardiology.

## 2018-08-03 ENCOUNTER — Other Ambulatory Visit (HOSPITAL_COMMUNITY): Payer: Self-pay | Admitting: Pharmacist

## 2018-08-03 MED ORDER — TADALAFIL (PAH) 20 MG PO TABS: 40.0000 mg | ORAL_TABLET | Freq: Every day | ORAL | 3 refills | Status: DC

## 2018-08-07 DIAGNOSIS — J188 Other pneumonia, unspecified organism: Secondary | ICD-10-CM | POA: Diagnosis not present

## 2018-08-07 DIAGNOSIS — R05 Cough: Secondary | ICD-10-CM | POA: Diagnosis not present

## 2018-08-07 DIAGNOSIS — R0602 Shortness of breath: Secondary | ICD-10-CM | POA: Diagnosis not present

## 2018-08-07 DIAGNOSIS — J45909 Unspecified asthma, uncomplicated: Secondary | ICD-10-CM | POA: Diagnosis not present

## 2018-08-07 DIAGNOSIS — J9601 Acute respiratory failure with hypoxia: Secondary | ICD-10-CM | POA: Diagnosis not present

## 2018-08-07 DIAGNOSIS — I10 Essential (primary) hypertension: Secondary | ICD-10-CM | POA: Diagnosis not present

## 2018-08-07 DIAGNOSIS — J449 Chronic obstructive pulmonary disease, unspecified: Secondary | ICD-10-CM | POA: Diagnosis not present

## 2018-08-07 DIAGNOSIS — J45901 Unspecified asthma with (acute) exacerbation: Secondary | ICD-10-CM | POA: Diagnosis not present

## 2018-08-07 DIAGNOSIS — R062 Wheezing: Secondary | ICD-10-CM | POA: Diagnosis not present

## 2018-08-16 ENCOUNTER — Ambulatory Visit: Payer: Medicare HMO | Admitting: Internal Medicine

## 2018-08-16 ENCOUNTER — Encounter: Payer: Self-pay | Admitting: Internal Medicine

## 2018-08-16 VITALS — BP 130/58 | HR 106 | Ht 63.5 in | Wt 131.0 lb

## 2018-08-16 DIAGNOSIS — J449 Chronic obstructive pulmonary disease, unspecified: Secondary | ICD-10-CM | POA: Diagnosis not present

## 2018-08-16 DIAGNOSIS — J9612 Chronic respiratory failure with hypercapnia: Secondary | ICD-10-CM | POA: Diagnosis not present

## 2018-08-16 DIAGNOSIS — J9611 Chronic respiratory failure with hypoxia: Secondary | ICD-10-CM

## 2018-08-16 NOTE — Patient Instructions (Addendum)
Goal is to keep the 02 sats above 90% while exercising   Work on inhaler technique:  relax and gently blow all the way out then take a nice smooth deep breath back in, triggering the inhaler at same time you start breathing in.  Hold for up to 5 seconds if you can. Blow out thru nose. Rinse and gargle with water when done     Please schedule a follow up visit in 6  months but call sooner if needed

## 2018-08-16 NOTE — Progress Notes (Signed)
Subjective:   Patient ID: Linda Stevens, female    DOB: 1946/04/06  MRN: [id number]    Brief patient profile:  72yowf quit smoking 02/22/14  with  GOLD II/III  copd/ cor pulmonale vs HP syndrome related to cirrhosis     Procedures: 1/24/17ECHO : EF 29%, grade 1 diastolic dysfx, severe RV dilation, PAS 87 mmHg, mod pericardial effusion 10/04/15  Doppler legs >> negative 10/04/15  V/Q scan >> low probability for PE 10/08/15 Rt/Lt heart cath >> RA 9, RV 97/8/12, PA 104/49/72, PCW 18, CI 2.34, PVR 13.6 10/05/15  PFT >> FEV1 0.91 (40%), FEV1% 56, TLC 4.32 (88%), DLCO 38%, no BD    11/06/2015  Transition of care f/u [name] re: copd gold II/III/ chronic resp failure/ ? Cor pulmonale vs hepatopulmonary syndrome?  Chief Complaint  Patient presents with  . Follow-up    Pt states overall her breathing is doing well. She is currrently taking Dulera, but ins prefers advair.   doe = MMRC2 = can't walk a nl pace on a flat grade s sob even on 02 1.5 lpm sometimes increases to 2 Very confused with details of care / leg swelling resolved  rec Try symbicort 160 Take 2 puffs first thing in am and then another 2 puffs about 12 hours later.  Continue spiriva for now pending follow up pfts Wear the oxygen as much as you can at 1.5 lpm  Please schedule a follow up office visit in 4 weeks, ok to do wlh PFTs same day    12/06/2015  f/u [name] re: GOLD II/III / spiriva/symbiocrt maint  Chief Complaint  Patient presents with  . Follow-up    PFT done at [facility]. Pt c/o min cough with clear sputum- relates to allergies.   no change doe = MMRC2 = can't walk a nl pace on a flat grade s sob  Even on 02  Main new problem is nasal congestion/ neg resp to flonase/ irritated by 02 but can't use humidifier at low flow rate  rec Change symbicort to advair 115 Take 2 puffs first thing in am and then another 2 puffs about 12 hours later. Work on inhaler technique:  Prednisone 10 mg take  4 each am x 2 days,   2 each  am x 2 days,  1 each am x 2 days and stop  I emphasized that nasal steroids have no immediate benefit in terms   Ok to take clariton or allegra as needed for allergies Consult Dr [place] Bells next available re pulmonary hypertension          07/07/2017 acute extended [name] re: COPD  GOLD II/III/ Incruse/advair (unable to name them but sure that's what they are Chief Complaint  Patient presents with  . Follow-up    Pt not feeling well, not breathing well since this past weekend. Pt has lower back pain moving to the sides affecting her breathing, pt has O2 turned up to 3 liters instead of 2 normally.   some raspy coughing first thing in am prod just white mucus  x around a week prior to OV   Last week can't really lie down due to back pain but not pleuritic / assoc with urgency  Has not tried even tylenol for back pain but heat seems to help. Denies trauma Doe minimally worse, better on higher 02 as above  rec   Doxycycline 100 mg twice daily x 10 days  Please remember to go to the lab and x-ray department:  dx new R effusion   thoracentesis  07/15/2017   X 580 cc with wbc's 1974 with L >> P .  Prot 3.5 , nl glucose   Alb 1.9  cyt neg with predominance of T lymphocytes         07/17/2017  f/u [name] re:  COPD II/ III and 02 2.5 increases to 4-5 with ex / new R effusion/ exudate  Chief Complaint  Patient presents with  . Follow-up    Breathing is much improved. She is coughing less sputum is minimal and clear.   sleeps at 45 degrees on 2.5 lpm and able to lie flatter since tap  Overall back near baseline p tap rec Plan A = Automatic = Trelegy one click x two good drags  Stop incruse and advair  Plan B = Backup Only use your albuterol (Proair=Red/ proventil=yellow) as a rescue medication to be used if you can't catch your breath   Please schedule a follow up office visit in 4 weeks, sooner if needed  - late add:  CT chest with contrast needed due to persistent atx on R base> referred  to T surgery > declined intervention, improved with diuresis      04/26/2018  f/u [name] re:  COPD II/ III/ maint trelegy  and 02 on 2.5-3 up to 5lpm when  on treadmill  Chief Complaint  Patient presents with  . Follow-up    SOB with activity and productive cough mucus clear to white.  Dyspnea:  treadmill x 510 steps x 20 min x slt incline tendency is better Cough: minimal white mucus mostly in ams Sleeping: slt angle < 30 degrees  SABA use: not using much  02: as above   rec Ok to try proaire 15 min before you exercise  Work on inhaler technique: Clotrimazole 10 mg troche up to 4 x daily as needed  Please schedule a follow up visit in 3 months but call sooner if needed - bring your drug formulary with you if there are any issues related to your medications so we can help you choose the least expensive.      08/16/2018  f/u [name] re:  COPD II/III spirometry / 02 dep 24/7 with cor pulmonale  Chief Complaint  Patient presents with  . Follow-up    Breathing is about the same. She has used her albuterol inhaler once in the past wk.   Dyspnea:  teadmill 4 x weekly / x 25 min / slt incline/ #3  Cough: no Sleeping: slt elevation with pillows SABA use: rarely need albuterol  02: 2-3lpm / up to 5lpm     No obvious day to day or daytime variability or assoc excess/ purulent sputum or mucus plugs or hemoptysis or cp or chest tightness, subjective wheeze or overt sinus or hb symptoms.   Sleeping ok as above without nocturnal  or early am exacerbation  of respiratory  c/o's or need for noct saba. Also denies any obvious fluctuation of symptoms with weather or environmental changes or other aggravating or alleviating factors except as outlined above   No unusual exposure hx or h/o childhood pna/ asthma or knowledge of premature birth.  Current Allergies, Complete Past Medical History, Past Surgical History, Family History, and Social History were reviewed in [employer] record.  ROS  The following are not active complaints unless bolded Hoarseness, sore throat, dysphagia, dental problems, itching, sneezing,  nasal congestion or discharge of excess mucus or purulent secretions, ear ache,  fever, chills, sweats, unintended wt loss or wt gain, classically pleuritic or exertional cp,  orthopnea pnd or arm/hand swelling  or leg swelling resolved, presyncope, palpitations, abdominal pain, anorexia, nausea, vomiting, diarrhea  or change in bowel habits or change in bladder habits, change in stools or change in urine, dysuria, hematuria,  rash, arthralgias, visual complaints, headache, numbness, weakness or ataxia or problems with walking or coordination,  change in mood or  memory.        Current Meds  Medication Sig  . acetaminophen (TYLENOL) 325 MG tablet Take 325 mg by mouth at bedtime as needed for moderate pain or headache.   [name] acetaminophen-codeine (TYLENOL #3) 300-30 MG tablet Take 1 tablet 1 hour prior to procedure then 1 tablet every 4 hours as needed  . albuterol (PROVENTIL HFA;VENTOLIN HFA) 108 (90 Base) MCG/ACT inhaler Inhale 2 puffs every 6 (six) hours as needed into the lungs for wheezing or shortness of breath.  [name] ambrisentan (LETAIRIS) 10 MG tablet Take 1 tablet (10 mg total) by mouth daily.  [name] aspirin EC 81 MG EC tablet Take 1 tablet (81 mg total) by mouth daily.  . clotrimazole (MYCELEX) 10 MG troche Take 1 tablet (10 mg total) by mouth 5 (five) times daily.  . fluticasone (FLONASE) 50 MCG/ACT nasal spray SHAKE WELL AND INSTILL 2 SPRAYS INTO EACH NOSTRIL ONCE DAILY  . Fluticasone-Umeclidin-Vilant (TRELEGY ELLIPTA) 100-62.5-25 MCG/INH AEPB Inhale 1 puff into the lungs daily.  . furosemide (LASIX) 40 MG tablet TAKE 1 TABLET BY MOUTH TWICE A DAY  . ipratropium (ATROVENT) 0.06 % nasal spray Place 2 sprays into both nostrils 2 (two) times daily.  . OXYGEN 2 to 2lpm 24/7  . Polyethyl Glycol-Propyl Glycol (SYSTANE OP) Apply 1-2 drops to eye daily as  needed (dry eyes).  [name] spironolactone (ALDACTONE) 25 MG tablet Take 0.5 tablets (12.5 mg total) by mouth daily.  . tadalafil, PAH, (ADCIRCA) 20 MG tablet Take 2 tablets (40 mg total) by mouth daily.                             Objective:   Physical Exam  04/06/2014        154 > 12/06/2015  148  > 08/28/2016   145 > 07/07/2017  142>07/17/2017  141> 04/26/2018   140 > 08/16/2018  131    03/09/14 147 lb (66.679 kg)  02/22/14 150 lb 3.2 oz (68.13 kg)  02/22/14 147 lb 8 oz (66.906 kg)      Vital signs reviewed - Note on arrival 02 sats  90% on 3lpm pulsed       amb pleasant wf nad    HEENT: nl dentition / oropharynx. Nl external ear canals without cough reflex -  Mild bilateral non-specific turbinate edema     NECK :  without JVD/Nodes/TM/ nl carotid upstrokes bilaterally   LUNGS: no acc muscle use,  Mild barrel  contour chest wall with bilateral minimal insp/exp rhonchi   without cough on insp or exp maneuver and mild  Hyperresonant  to  percussion bilaterally     CV:  RRR  no s3 or murmur or increase in P2, and no edema   ABD:  soft and nontender with pos late  insp Hoover's  in the supine position. No bruits or organomegaly appreciated, bowel sounds nl  MS:   Nl gait/  ext warm without deformities, calf tenderness, cyanosis or clubbing No obvious joint restrictions   SKIN:  warm and dry without lesions    NEURO:  alert, approp, nl sensorium with  no motor or cerebellar deficits apparent.                                   Assessment & Plan:

## 2018-08-17 ENCOUNTER — Encounter: Payer: Self-pay | Admitting: Internal Medicine

## 2018-08-17 NOTE — Assessment & Plan Note (Signed)
Discharged   on 02  10/09/15 p admit  - HCO3  On 01/26/17  = 32  - HC03   On 07/20/2017   = 34  - HC03   On 09/26/17         = 34 - HC03   On 07/19/18       = 37   As of  08/16/2018 :  rx 3lpm rest and up to 5 lpm with activity   Adequate control on present rx, reviewed in detail with pt > no change in rx needed     I had an extended discussion with the patient reviewing all relevant studies completed to date and  lasting 15 to 20 minutes of a 25 minute visit    See device teaching which extended face to face time for this visit.  Each maintenance medication was reviewed in detail including emphasizing most importantly the difference between maintenance and prns and under what circumstances the prns are to be triggered using an action plan format that is not reflected in the computer generated alphabetically organized AVS which I have not found useful in most complex patients, especially with respiratory illnesses  Please see AVS for specific instructions unique to this visit that I personally wrote and verbalized to the the pt in detail and then reviewed with pt  by my nurse highlighting any  changes in therapy recommended at today's visit to their plan of care.

## 2018-08-17 NOTE — Assessment & Plan Note (Addendum)
Quit smoking 02/2014 - PFT's  04/06/14   FEV1 1.36 (57 % ) ratio 63  p 9 % improvement from saba with DLCO  51 % corrects to 71 % for alv volume   - PFT's  10/08/2015  FEV1 0.91 (40 % ) ratio 56  p no % improvement from saba with DLCO 38 % corrects to 63 % for alv volume   - PFT's  12/06/2015  FEV1 1.17  (52 % ) ratio 62  p 16 % improvement from saba p no rx prior to study with DLCO  28 % corrects to 44 % for alv volume   - 07/17/2017   try change to trelegy > improved   - 08/16/2018  After extensive coaching inhaler device,  effectiveness =    75% hfa  (trigger early) and 90% on trelegy    She is easily confused with destails of resp care but generally doing better with simpler to use trelegy device and if continues to struggle with hfa needs the saba dpi = Proair respiclick considered next ov

## 2018-08-31 NOTE — Assessment & Plan Note (Signed)
Neg rheum eval. Dr. Hayden Pedro felt possible component of hepatic hydrothorax, now controlled with diuresis

## 2018-08-31 NOTE — Assessment & Plan Note (Signed)
Well controlled at home and in office today.

## 2018-09-07 DIAGNOSIS — R05 Cough: Secondary | ICD-10-CM | POA: Diagnosis not present

## 2018-09-07 DIAGNOSIS — R062 Wheezing: Secondary | ICD-10-CM | POA: Diagnosis not present

## 2018-09-07 DIAGNOSIS — J449 Chronic obstructive pulmonary disease, unspecified: Secondary | ICD-10-CM | POA: Diagnosis not present

## 2018-09-07 DIAGNOSIS — R0602 Shortness of breath: Secondary | ICD-10-CM | POA: Diagnosis not present

## 2018-09-07 DIAGNOSIS — J45901 Unspecified asthma with (acute) exacerbation: Secondary | ICD-10-CM | POA: Diagnosis not present

## 2018-09-07 DIAGNOSIS — J45909 Unspecified asthma, uncomplicated: Secondary | ICD-10-CM | POA: Diagnosis not present

## 2018-09-07 DIAGNOSIS — J188 Other pneumonia, unspecified organism: Secondary | ICD-10-CM | POA: Diagnosis not present

## 2018-09-07 DIAGNOSIS — J9601 Acute respiratory failure with hypoxia: Secondary | ICD-10-CM | POA: Diagnosis not present

## 2018-09-07 DIAGNOSIS — I10 Essential (primary) hypertension: Secondary | ICD-10-CM | POA: Diagnosis not present

## 2018-09-27 ENCOUNTER — Other Ambulatory Visit (HOSPITAL_COMMUNITY): Payer: Self-pay

## 2018-09-27 MED ORDER — AMBRISENTAN 10 MG PO TABS: 10.0000 mg | ORAL_TABLET | Freq: Every day | ORAL | 6 refills | Status: DC

## 2018-10-05 ENCOUNTER — Telehealth (HOSPITAL_COMMUNITY): Payer: Self-pay

## 2018-10-05 NOTE — Telephone Encounter (Signed)
Prior authorization through [employer] was APPROVED for ambrisentan and will expire on 09/08/2019.

## 2018-10-08 DIAGNOSIS — J449 Chronic obstructive pulmonary disease, unspecified: Secondary | ICD-10-CM | POA: Diagnosis not present

## 2018-10-08 DIAGNOSIS — R05 Cough: Secondary | ICD-10-CM | POA: Diagnosis not present

## 2018-10-08 DIAGNOSIS — J9601 Acute respiratory failure with hypoxia: Secondary | ICD-10-CM | POA: Diagnosis not present

## 2018-10-08 DIAGNOSIS — J45901 Unspecified asthma with (acute) exacerbation: Secondary | ICD-10-CM | POA: Diagnosis not present

## 2018-10-08 DIAGNOSIS — R0602 Shortness of breath: Secondary | ICD-10-CM | POA: Diagnosis not present

## 2018-10-08 DIAGNOSIS — J45909 Unspecified asthma, uncomplicated: Secondary | ICD-10-CM | POA: Diagnosis not present

## 2018-10-08 DIAGNOSIS — J188 Other pneumonia, unspecified organism: Secondary | ICD-10-CM | POA: Diagnosis not present

## 2018-10-08 DIAGNOSIS — R062 Wheezing: Secondary | ICD-10-CM | POA: Diagnosis not present

## 2018-10-08 DIAGNOSIS — I10 Essential (primary) hypertension: Secondary | ICD-10-CM | POA: Diagnosis not present

## 2018-10-14 ENCOUNTER — Other Ambulatory Visit: Payer: Self-pay | Admitting: Internal Medicine

## 2018-10-14 MED ORDER — FLUTICASONE PROPIONATE 50 MCG/ACT NA SUSP: NASAL | 1 refills | Status: DC

## 2018-10-25 ENCOUNTER — Encounter (HOSPITAL_COMMUNITY): Payer: Self-pay | Admitting: Internal Medicine

## 2018-10-25 ENCOUNTER — Ambulatory Visit (HOSPITAL_COMMUNITY)
Admission: RE | Admit: 2018-10-25 | Discharge: 2018-10-25 | Disposition: A | Discharge status: Home / Self Care | Payer: Medicare HMO | Source: Ambulatory Visit | Attending: Internal Medicine | Admitting: Internal Medicine

## 2018-10-25 VITALS — BP 142/60 | HR 100 | Wt 138.8 lb

## 2018-10-25 DIAGNOSIS — Z8249 Family history of ischemic heart disease and other diseases of the circulatory system: Secondary | ICD-10-CM | POA: Diagnosis not present

## 2018-10-25 DIAGNOSIS — Z79899 Other long term (current) drug therapy: Secondary | ICD-10-CM | POA: Insufficient documentation

## 2018-10-25 DIAGNOSIS — J439 Emphysema, unspecified: Secondary | ICD-10-CM | POA: Insufficient documentation

## 2018-10-25 DIAGNOSIS — Z7982 Long term (current) use of aspirin: Secondary | ICD-10-CM | POA: Insufficient documentation

## 2018-10-25 DIAGNOSIS — I509 Heart failure, unspecified: Secondary | ICD-10-CM | POA: Diagnosis not present

## 2018-10-25 DIAGNOSIS — I272 Pulmonary hypertension, unspecified: Secondary | ICD-10-CM | POA: Insufficient documentation

## 2018-10-25 DIAGNOSIS — M1990 Unspecified osteoarthritis, unspecified site: Secondary | ICD-10-CM | POA: Insufficient documentation

## 2018-10-25 DIAGNOSIS — D751 Secondary polycythemia: Secondary | ICD-10-CM | POA: Insufficient documentation

## 2018-10-25 DIAGNOSIS — K7460 Unspecified cirrhosis of liver: Secondary | ICD-10-CM | POA: Diagnosis not present

## 2018-10-25 DIAGNOSIS — Z825 Family history of asthma and other chronic lower respiratory diseases: Secondary | ICD-10-CM | POA: Insufficient documentation

## 2018-10-25 DIAGNOSIS — Z9981 Dependence on supplemental oxygen: Secondary | ICD-10-CM | POA: Insufficient documentation

## 2018-10-25 DIAGNOSIS — Z87891 Personal history of nicotine dependence: Secondary | ICD-10-CM | POA: Diagnosis not present

## 2018-10-25 DIAGNOSIS — J90 Pleural effusion, not elsewhere classified: Secondary | ICD-10-CM | POA: Insufficient documentation

## 2018-10-25 DIAGNOSIS — J9610 Chronic respiratory failure, unspecified whether with hypoxia or hypercapnia: Secondary | ICD-10-CM | POA: Insufficient documentation

## 2018-10-25 DIAGNOSIS — I5030 Unspecified diastolic (congestive) heart failure: Secondary | ICD-10-CM | POA: Diagnosis not present

## 2018-10-25 DIAGNOSIS — I110 Hypertensive heart disease with heart failure: Secondary | ICD-10-CM | POA: Diagnosis present

## 2018-10-25 LAB — BASIC METABOLIC PANEL
Anion gap: 8 (ref 5–15)
BUN: 12 mg/dL (ref 8–23)
CO2: 35 mmol/L — ABNORMAL HIGH (ref 22–32)
Calcium: 8.8 mg/dL — ABNORMAL LOW (ref 8.9–10.3)
Chloride: 98 mmol/L (ref 98–111)
Creatinine, Ser: 0.83 mg/dL (ref 0.44–1.00)
GFR calc Af Amer: >60 mL/min (ref >60)
GFR calc non Af Amer: >60 mL/min (ref >60)
Glucose, Bld: 114 mg/dL — ABNORMAL HIGH (ref 70–99)
Potassium: 3.1 mmol/L — ABNORMAL LOW (ref 3.5–5.1)
Sodium: 141 mmol/L (ref 135–145)

## 2018-10-25 LAB — BRAIN NATRIURETIC PEPTIDE: B NATRIURETIC PEPTIDE 5: 195.6 pg/mL — AB (ref 0.0–100.0)

## 2018-10-25 MED ORDER — SPIRONOLACTONE 25 MG PO TABS: 12.5000 mg | ORAL_TABLET | Freq: Every day | ORAL | 3 refills | Status: DC

## 2018-10-25 NOTE — Patient Instructions (Signed)
Lab work done today. We will contact you for any abnormal lab work.  RESTART Spironolactone 12.109m (0.5 tab) daily  Follow up with Dr. BHaroldine Lawsin 6 months.

## 2018-10-25 NOTE — Progress Notes (Signed)
mnnPatient ID: [name], female   DOB: 03/28/1946, 73 y.o.   MRN: [id number]  ADVANCED HF CLINIC NOTE  Patient ID: Linda Stevens, female   DOB: November 18, 1945, 73 y.o.   MRN: [id number] Primary Cardiologist: Dr Haroldine Laws  Pulmonary: Dr Melvyn Novas   HPI:  Linda Stevens is  73 y/o woman with a h/o HTN, COPD (former smoker quit 2015), seasonal allergies, asthma, pulmonary HTN, and diastolic dysfxn.  Admitted January 2017 secondary to progressive DOE, hypoxia, and lower ext edema and found to have severe PAH. R/LHC 10/08/15 with normal coronaries, normal CO, and severe PAH PAP 104/49 with 13.6 WU. PAH well out of proportion to left-sided filling pressures and COPD. Started revatio 20 TID with consideration for macitentan as outpatient. PFTs 10/05/15  FEV1 0.91 (40%)FVC 1.64 (55%), DLCO 8.80 (38%). Auto-immune and infectious serologies negative. VQ negative. Discharge weight 148 pounds.   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 Has been drained twice. Underwent first thoracentesis on 11/7.  Transudative. CT scan 07/24/17 showed large R effusion and small pericardial effusion. +COPD.  Underwent repeat thoracentesis on 07/29/17 with 310cc out.  Cytology negative. F/u CXR 11/21 showed small bilateral effusions.  Referred to Dr. Roxan Hockey for possible Pleurex versus VATS in 12/18 but she did not want to proceed. Ab u/s with cirrhotic features.  RF markedly ++ but other serology negative. Saw Dr. Amil Amen who said she did not have RA but was diagnosed with polyarthralgia/OA.   She presents today for regular follow up. Feels ok. Walking on TM 3-4x/week. Walking 15 mins. No CP. Keeps HR < 115. Taking lasix 40 bid. Off spironolactone because she ran out of it. No edema.     Echo 4/19 EF 65% RV normal. No effusion Personally reviewed Echo 5/18 EF 60-65% RV completely normal. No TR to measure RVSP. No effusion  Echo 3/17 EF 60-65% septum still  flattened. RV size improved moderate HK. RVSP ~80. IVC small. Pericardial effusion essentially resolved   CXR 09/28/17 Small bilateral effusions  PAH regimen: 1. Adcirca 40 2. Letairis 10 (Switched to [name] as it was thought that sinusitis might be due to macitentan.)  6MW 8/17; 840 ft (256 M) on 2 L O2, sats ranged 83-95%, HR ranged 96-111. 6MW 9/19 780 ft =237.770mters On 3L of O2 Starting O2 94% Ending O2-85%  Starting heart rate-80 Ending heart rate- 110  RHC 12/18 RA = 5 RV = 56/9 PA = 52/17 (33) PCW = 10 Fick cardiac output/index = 7.5/4.5 PVR = 3.5 WU Ao sat = 96% PA sat = 73%, 72% High SVC sat = 76%  VQ scan low prob. CT chest mild COPD. Moderate pericardial effusion. 3v CAD. + cirrhosis. Hepatitis serologies negative.   PFTs  (FEV1 1.23 (52%) with FVC 2.01 (65%) and DLCO 51% in 7/15) PFT's 10/08/2015 FEV1 0.91 (40 %) ratio 56%DLCO 38 % corrects to 63 % for alv volume  PFTs  12/06/15 FEV1 1.0 (44%) FVC 1.74 (58%0 DLCO 28%  R/LHC 10/08/15 with normal coronaries, normal CO, and sever PAH with 13.6 WU.  Findings: Ao = 108/68 (86) LV = 102/10/11 RA = 9 RV = 97/8/12 PA = 104/49 (72) PCW = 18 Fick cardiac output/index = 3.96/2.34 PVR = 13.6 WU FA sat = 93% PA sat = 66%, 69%  RHC 5/18 RA = 2 RV = 48/4 PA = 45/17 (30) PCW = 8 Fick cardiac output/index = 4.9/3.0 PVR = 4.4 WU Ao sat = 94%  PA sat = 68%, 70%  Review of systems complete and found to be negative unless listed in HPI.   SH:  Social History   Socioeconomic History  . Marital status: Married    Spouse name: Not on file  . Number of children: Not on file  . Years of education: 2  . Highest education level: Not on file  Occupational History  . Occupation: retired Tour manager  . Financial resource strain: Not on file  . Food insecurity:    Worry: Not on file    Inability: Not on file  . Transportation needs:    Medical: Not on file    Non-medical: Not on file  Tobacco Use  .  Smoking status: Former Smoker    Packs/day: 1.00    Years: 40.00    Pack years: 40.00    Types: Cigarettes    Last attempt to quit: 02/22/2014    Years since quitting: 4.6  . Smokeless tobacco: Never Used  Substance and Sexual Activity  . Alcohol use: No  . Drug use: No  . Sexual activity: Never  Lifestyle  . Physical activity:    Days per week: Not on file    Minutes per session: Not on file  . Stress: Not on file  Relationships  . Social connections:    Talks on phone: Not on file    Gets together: Not on file    Attends religious service: Not on file    Active member of club or organization: Not on file    Attends meetings of clubs or organizations: Not on file    Relationship status: Not on file  . Intimate partner violence:    Fear of current or ex partner: Not on file    Emotionally abused: Not on file    Physically abused: Not on file    Forced sexual activity: Not on file  Other Topics Concern  . Not on file  Social History Narrative   Married with 2 children.  Independent of ADLs.      Does not have a living will.   Would desire CPR but would not want prolonged life support if futile- husband aware.    FH:  Family History  Problem Relation Age of Onset  . Glaucoma Brother   . Vascular Disease Father        Died of complications from surgery  . Heart attack Father   . Asthma Mother        Died of asthma attack  . Dementia Mother   . Hyperlipidemia Mother   . Hypertension Mother     Past Medical History:  Diagnosis Date  . Acute respiratory failure with hypoxia and hypercapnea 02/26/2014  . Allergic rhinitis due to pollen   . Allergy   . Arthritis   . Asthma   . CHF (congestive heart failure) ([place])   . COPD (chronic obstructive pulmonary disease) ([place])   . Diastolic dysfunction    a. 02/2014 Echo: EF 65-70%, Gr 1 DD, RVH;  b. 09/2015 Echo: EF 55%, Gr 1 DD.  [name] Emphysema of lung ([place])   . H/O seasonal allergies   . History of chicken pox   . History  of tobacco abuse    a. Quit 2015.  [name] History of UTI   . Hypertension   . Hypertensive heart disease   . Lower extremity edema   . Pericardial effusion    a. 09/2015 Echo: Mod eff w/o tamponade.  [name]  Right heart failure with reduced right ventricular function ([place])    a. 09/2015 Echo: EF 55%, Gr 1 DD, D shaped IV septum, sev dil RV with mod reduced fxn, mod dil RA, RV-RA grad 36mHg, PASP 894mg, mod pericard eff w/o tamponade.  . Severe Pulmonary Hypertension    a. 09/2015 Echo: PASP 8712m.    Current Outpatient Medications  Medication Sig Dispense Refill  . acetaminophen (TYLENOL) 325 MG tablet Take 325 mg by mouth at bedtime as needed for moderate pain or headache.     . aMarland Kitchenetaminophen-codeine (TYLENOL #3) 300-30 MG tablet Take 1 tablet 1 hour prior to procedure then 1 tablet every 4 hours as needed 16 tablet 0  . albuterol (PROVENTIL HFA;VENTOLIN HFA) 108 (90 Base) MCG/ACT inhaler Inhale 2 puffs every 6 (six) hours as needed into the lungs for wheezing or shortness of breath. 1 Inhaler 11  . ambrisentan (LETAIRIS) 10 MG tablet Take 1 tablet (10 mg total) by mouth daily. 30 tablet 6  . aspirin EC 81 MG EC tablet Take 1 tablet (81 mg total) by mouth daily. 30 tablet 0  . clotrimazole (MYCELEX) 10 MG troche Take 1 tablet (10 mg total) by mouth 5 (five) times daily. 40 tablet 1  . fluticasone (FLONASE) 50 MCG/ACT nasal spray SHAKE WELL AND INSTILL 2 SPRAYS INTO EACH NOSTRIL ONCE DAILY 48 g 1  . Fluticasone-Umeclidin-Vilant (TRELEGY ELLIPTA) 100-62.5-25 MCG/INH AEPB Inhale 1 puff into the lungs daily. 60 each 3  . furosemide (LASIX) 40 MG tablet TAKE 1 TABLET BY MOUTH TWICE A DAY 180 tablet 1  . ipratropium (ATROVENT) 0.06 % nasal spray Place 2 sprays into both nostrils 2 (two) times daily. 45 mL 3  . OXYGEN 2 to 2lpm 24/7    . Polyethyl Glycol-Propyl Glycol (SYSTANE OP) Apply 1-2 drops to eye daily as needed (dry eyes).    . tadalafil, PAH, (ADCIRCA) 20 MG tablet Take 2 tablets (40 mg total) by  mouth daily. 180 tablet 3  . spironolactone (ALDACTONE) 25 MG tablet Take 0.5 tablets (12.5 mg total) by mouth daily. (Patient not taking: Reported on 10/25/2018) 45 tablet 3   No current facility-administered medications for this encounter.     Vitals:   10/25/18 1431  BP: (!) 142/60  Pulse: 100  SpO2: 92%  Weight: 63 kg (138 lb 12.8 oz)   Wt Readings from Last 3 Encounters:  10/25/18 63 kg (138 lb 12.8 oz)  08/16/18 59.4 kg (131 lb)  07/23/18 63.5 kg (140 lb)     PHYSICAL EXAM: General:  Well appearing. Wearing O2. No resp difficulty HEENT: normal +telangectasias  Neck: supple. JVP 6 Carotids 2+ bilat; no bruits. No lymphadenopathy or thryomegaly appreciated. Cor: PMI nondisplaced. Regular rate & rhythm. 2/6 TR Lungs: clear decreased BS throughout Abdomen: soft, nontender, nondistended. No hepatosplenomegaly. No bruits or masses. Good bowel sounds. Extremities: no cyanosis, clubbing, rash, edema +telangectasias  Neuro: alert & orientedx3, cranial nerves grossly intact. moves all 4 extremities w/o difficulty. Affect pleasant    ASSESSMENT & PLAN:  1. PAH with right heart failure - suspect combination of WHO Group I and III PAH - Echo 5/18 with complete resolution of RV strain. No significant TR to estimate RVSP - Echo 4/19 with EF 65% no evidence of RV strain  - Repeat RHC 12/18 with mild PAH (mean PA 33) - Stable NYHA II-III on. Echo with resolution of RV strain. Will continue O2, Adcirca and letaris  - Continue lasix 40 bid. - Restart spiro 12.5.  BMET today.  - Discussed eventual need to add selexipag but PA pressures are low.   2. Recurrent R pleural effusion --Given improvement in PA pressures doubt effusion related to severe PAH. Macitentan often associated with fluid retention but I have not seen it cause pleural effusions. Previous ab CT with evidence of cirrhosis.  -- Fluid analysis = transudative.  -- Effusion much improved on CXR 12/2017 (small)  -- Rheum  work-up negative so far. With diffuse telangectasias will repeat [place]-70 and ANA serologies. 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 Now controlled with with diuresis  3. Chronic Respiratory failure - Multifactorial. Stable. On 2 liters oxygen - Follows with Dr. Melvyn Novas.   4. COPD- Gold III  - on inhalers. Follows with Pulmonary. As above. No change.  5. Polycythemia suspect due to chronic hypoxia  --Resolved. No change.  6. Cirrhosis on CT --due to RHF. Hepatitis serologies are negative. No change  Linda Bickers, MD  2:59 PM

## 2018-10-26 LAB — ANTI-SCLERODERMA ANTIBODY

## 2018-10-26 LAB — ANA: ANA: NEGATIVE

## 2018-11-06 DIAGNOSIS — J9601 Acute respiratory failure with hypoxia: Secondary | ICD-10-CM | POA: Diagnosis not present

## 2018-11-06 DIAGNOSIS — R062 Wheezing: Secondary | ICD-10-CM | POA: Diagnosis not present

## 2018-11-06 DIAGNOSIS — R05 Cough: Secondary | ICD-10-CM | POA: Diagnosis not present

## 2018-11-06 DIAGNOSIS — J449 Chronic obstructive pulmonary disease, unspecified: Secondary | ICD-10-CM | POA: Diagnosis not present

## 2018-11-06 DIAGNOSIS — J188 Other pneumonia, unspecified organism: Secondary | ICD-10-CM | POA: Diagnosis not present

## 2018-11-06 DIAGNOSIS — I10 Essential (primary) hypertension: Secondary | ICD-10-CM | POA: Diagnosis not present

## 2018-11-06 DIAGNOSIS — J45901 Unspecified asthma with (acute) exacerbation: Secondary | ICD-10-CM | POA: Diagnosis not present

## 2018-11-06 DIAGNOSIS — J45909 Unspecified asthma, uncomplicated: Secondary | ICD-10-CM | POA: Diagnosis not present

## 2018-11-06 DIAGNOSIS — R0602 Shortness of breath: Secondary | ICD-10-CM | POA: Diagnosis not present

## 2018-11-07 ENCOUNTER — Other Ambulatory Visit: Payer: Self-pay | Admitting: Internal Medicine

## 2018-12-05 DIAGNOSIS — R062 Wheezing: Secondary | ICD-10-CM | POA: Diagnosis not present

## 2018-12-05 DIAGNOSIS — J449 Chronic obstructive pulmonary disease, unspecified: Secondary | ICD-10-CM | POA: Diagnosis not present

## 2018-12-05 DIAGNOSIS — J9601 Acute respiratory failure with hypoxia: Secondary | ICD-10-CM | POA: Diagnosis not present

## 2018-12-05 DIAGNOSIS — J45909 Unspecified asthma, uncomplicated: Secondary | ICD-10-CM | POA: Diagnosis not present

## 2018-12-06 ENCOUNTER — Other Ambulatory Visit (HOSPITAL_COMMUNITY): Payer: Self-pay | Admitting: Internal Medicine

## 2018-12-09 ENCOUNTER — Other Ambulatory Visit (HOSPITAL_COMMUNITY): Payer: Self-pay | Admitting: *Deleted

## 2018-12-09 MED ORDER — IPRATROPIUM BROMIDE 0.06 % NA SOLN: 2.0000 | Freq: Two times a day (BID) | NASAL | 3 refills | Status: DC

## 2019-01-06 DIAGNOSIS — R062 Wheezing: Secondary | ICD-10-CM | POA: Diagnosis not present

## 2019-01-06 DIAGNOSIS — J449 Chronic obstructive pulmonary disease, unspecified: Secondary | ICD-10-CM | POA: Diagnosis not present

## 2019-01-06 DIAGNOSIS — J45909 Unspecified asthma, uncomplicated: Secondary | ICD-10-CM | POA: Diagnosis not present

## 2019-01-06 DIAGNOSIS — J9601 Acute respiratory failure with hypoxia: Secondary | ICD-10-CM | POA: Diagnosis not present

## 2019-01-11 ENCOUNTER — Other Ambulatory Visit (INDEPENDENT_AMBULATORY_CARE_PROVIDER_SITE_OTHER): Payer: Medicare HMO

## 2019-01-11 DIAGNOSIS — Z1211 Encounter for screening for malignant neoplasm of colon: Secondary | ICD-10-CM

## 2019-01-11 LAB — FECAL OCCULT BLOOD, IMMUNOCHEMICAL: Fecal Occult Bld: NEGATIVE

## 2019-02-06 DIAGNOSIS — J45909 Unspecified asthma, uncomplicated: Secondary | ICD-10-CM | POA: Diagnosis not present

## 2019-02-06 DIAGNOSIS — J9601 Acute respiratory failure with hypoxia: Secondary | ICD-10-CM | POA: Diagnosis not present

## 2019-02-06 DIAGNOSIS — J449 Chronic obstructive pulmonary disease, unspecified: Secondary | ICD-10-CM | POA: Diagnosis not present

## 2019-02-06 DIAGNOSIS — R062 Wheezing: Secondary | ICD-10-CM | POA: Diagnosis not present

## 2019-02-07 ENCOUNTER — Ambulatory Visit (INDEPENDENT_AMBULATORY_CARE_PROVIDER_SITE_OTHER): Payer: Medicare HMO | Admitting: Internal Medicine

## 2019-02-07 ENCOUNTER — Encounter: Payer: Self-pay | Admitting: Internal Medicine

## 2019-02-07 ENCOUNTER — Other Ambulatory Visit: Payer: Self-pay

## 2019-02-07 DIAGNOSIS — J9611 Chronic respiratory failure with hypoxia: Secondary | ICD-10-CM

## 2019-02-07 DIAGNOSIS — J449 Chronic obstructive pulmonary disease, unspecified: Secondary | ICD-10-CM

## 2019-02-07 DIAGNOSIS — J9612 Chronic respiratory failure with hypercapnia: Secondary | ICD-10-CM

## 2019-02-07 NOTE — Assessment & Plan Note (Signed)
Discharged on 02  10/09/15 p admit  - HCO3  On 01/26/17  = 32  - HC03   On 07/20/2017   = 34  - HC03   On 09/26/17         = 34 - HC03   On 07/19/18       = 37   As of 02/07/2019  2.5 lpm sleep and at rest and adjust with ex to keep > 90%    Each maintenance medication was reviewed in detail including most importantly the difference between maintenance and as needed and under what circumstances the prns are to be used.  Please see AVS for specific  Instructions which are unique to this visit and I personally typed out  which were reviewed in detai over the phonewith the patient and a copy provided via MyChart   F/u q 3 m -call sooner prn

## 2019-02-07 NOTE — Progress Notes (Signed)
Subjective:   Patient ID: Linda Stevens, female    DOB: 1946/04/06  MRN: [id number]    Brief patient profile:  [id number] quit smoking 02/22/14  with  GOLD II/III  copd/ cor pulmonale vs HP syndrome related to cirrhosis     Procedures: 1/24/17ECHO : EF 29%, grade 1 diastolic dysfx, severe RV dilation, PAS 87 mmHg, mod pericardial effusion 10/04/15  Doppler legs >> negative 10/04/15  V/Q scan >> low probability for PE 10/08/15 Rt/Lt heart cath >> RA 9, RV 97/8/12, PA 104/49/72, PCW 18, CI 2.34, PVR 13.6 10/05/15  PFT >> FEV1 0.91 (40%), FEV1% 56, TLC 4.32 (88%), DLCO 38%, no BD    11/06/2015  Transition of care f/u [name] re: copd gold II/III/ chronic resp failure/ ? Cor pulmonale vs hepatopulmonary syndrome?  Chief Complaint  Patient presents with  . Follow-up    Pt states overall her breathing is doing well. She is currrently taking Dulera, but ins prefers advair.   doe = MMRC2 = can't walk a nl pace on a flat grade s sob even on 02 1.5 lpm sometimes increases to 2 Very confused with details of care / leg swelling resolved  rec Try symbicort 160 Take 2 puffs first thing in am and then another 2 puffs about 12 hours later.  Continue spiriva for now pending follow up pfts Wear the oxygen as much as you can at 1.5 lpm  Please schedule a follow up office visit in 4 weeks, ok to do wlh PFTs same day    12/06/2015  f/u [name] re: GOLD II/III / spiriva/symbiocrt maint  Chief Complaint  Patient presents with  . Follow-up    PFT done at [facility]. Pt c/o min cough with clear sputum- relates to allergies.   no change doe = MMRC2 = can't walk a nl pace on a flat grade s sob  Even on 02  Main new problem is nasal congestion/ neg resp to flonase/ irritated by 02 but can't use humidifier at low flow rate  rec Change symbicort to advair 115 Take 2 puffs first thing in am and then another 2 puffs about 12 hours later. Work on inhaler technique:  Prednisone 10 mg take  4 each am x 2 days,   2 each  am x 2 days,  1 each am x 2 days and stop  I emphasized that nasal steroids have no immediate benefit in terms   Ok to take clariton or allegra as needed for allergies Consult Dr Lake Bells next available re pulmonary hypertension          07/07/2017 acute extended [name] re: COPD  GOLD II/III/ Incruse/advair (unable to name them but sure that's what they are Chief Complaint  Patient presents with  . Follow-up    Pt not feeling well, not breathing well since this past weekend. Pt has lower back pain moving to the sides affecting her breathing, pt has O2 turned up to 3 liters instead of 2 normally.   some raspy coughing first thing in am prod just white mucus  x around a week prior to OV   Last week can't really lie down due to back pain but not pleuritic / assoc with urgency  Has not tried even tylenol for back pain but heat seems to help. Denies trauma Doe minimally worse, better on higher 02 as above  rec   Doxycycline 100 mg twice daily x 10 days  Please remember to go to the lab and x-ray department:  dx new R effusion   thoracentesis  07/15/2017   X 580 cc with wbc's 1974 with L >> P .  Prot 3.5 , nl glucose   Alb 1.9  cyt neg with predominance of T lymphocytes         07/17/2017  f/u [name] re:  COPD II/ III and 02 2.5 increases to 4-5 with ex / new R effusion/ exudate  Chief Complaint  Patient presents with  . Follow-up    Breathing is much improved. She is coughing less sputum is minimal and clear.   sleeps at 45 degrees on 2.5 lpm and able to lie flatter since tap  Overall back near baseline p tap rec Plan A = Automatic = Trelegy one click x two good drags  Stop incruse and advair  Plan B = Backup Only use your albuterol (Proair=Red/ proventil=yellow) as a rescue medication to be used if you can't catch your breath   Please schedule a follow up office visit in 4 weeks, sooner if needed  - late add:  CT chest with contrast needed due to persistent atx on R base> referred  to T surgery > declined intervention, improved with diuresis      04/26/2018  f/u [name] re:  COPD II/ III/ maint trelegy  and 02 on 2.5-3 up to 5lpm when  on treadmill  Chief Complaint  Patient presents with  . Follow-up    SOB with activity and productive cough mucus clear to white.  Dyspnea:  treadmill x 510 steps x 20 min x slt incline tendency is better Cough: minimal white mucus mostly in ams Sleeping: slt angle < 30 degrees  SABA use: not using much  02: as above   rec Ok to try proaire 15 min before you exercise  Work on inhaler technique: Clotrimazole 10 mg troche up to 4 x daily as needed  Please schedule a follow up visit in 3 months but call sooner if needed - bring your drug formulary with you if there are any issues related to your medications so we can help you choose the least expensive.      08/16/2018  f/u [name] re:  COPD II/III spirometry / 02 dep 24/7 with cor pulmonale  Chief Complaint  Patient presents with  . Follow-up    Breathing is about the same. She has used her albuterol inhaler once in the past wk.   Dyspnea:  teadmill 4 x weekly / x 25 min / slt incline/ #3  Cough: no Sleeping: slt elevation with pillows SABA use: rarely need albuterol  rec Goal is to keep the 02 sats above 90% while exercising  Work on inhaler technique:   Virtual Visit via Telephone Note 02/07/2019   I connected with Linda Stevens on 02/07/19 at 8 AM EDT by telephone and verified that I am speaking with the correct person using two identifiers.   I discussed the limitations, risks, security and privacy concerns of performing an evaluation and management service by telephone and the availability of in person appointments. I also discussed with the patient that there may be a patient responsible charge related to this service. The patient expressed understanding and agreed to proceed.   History of Present Illness: Dyspnea:  15-20 min on treadmill on "2" slt elevation on  3.5 lpm continuous with sats low 90s Cough: slt hoarse but not cough Sleeping: flat with 2 pillows SABA use: maybe once a day 02: 2.5 lpm hs and feels rested in am/  and 2.5lpm at rest    No obvious day to day or daytime variability or assoc excess/ purulent sputum or mucus plugs or hemoptysis or cp or chest tightness, subjective wheeze or overt sinus or hb symptoms.    Also denies any obvious fluctuation of symptoms with weather or environmental changes or other aggravating or alleviating factors except as outlined above.   Meds reviewed/ med reconciliation completed        Observations/Objective: slt hoarse, squeaky voice talking in full sentences    Assessment and Plan: See problem list for active a/p's   Follow Up Instructions: See avs for instructions unique to this ov which includes revised/ updated med list     I discussed the assessment and treatment plan with the patient. The patient was provided an opportunity to ask questions and all were answered. The patient agreed with the plan and demonstrated an understanding of the instructions.   The patient was advised to call back or seek an in-person evaluation if the symptoms worsen or if the condition fails to improve as anticipated.  I provided 27 minutes of non-face-to-face time during this encounter.   Christinia Gully, MD

## 2019-02-07 NOTE — Assessment & Plan Note (Addendum)
Quit smoking 02/2014 - PFT's  04/06/14   FEV1 1.36 (57 % ) ratio 63  p 9 % improvement from saba with DLCO  51 % corrects to 71 % for alv volume   - PFT's  10/08/2015  FEV1 0.91 (40 % ) ratio 56  p no % improvement from saba with DLCO 38 % corrects to 63 % for alv volume   - PFT's  12/06/2015  FEV1 1.17  (52 % ) ratio 62  p 16 % improvement from saba p no rx prior to study with DLCO  28 % corrects to 44 % for alv volume   - 07/17/2017   try change to trelegy > improved  - 08/16/2018  After extensive coaching inhaler device,  effectiveness =    75% (trigger early)    Adequate control on present rx, reviewed in detail with pt > no change in rx needed  But needs to remember to rinse and gargle p use and remember how to use saba correctly.  I spent extra time with pt today reviewing appropriate use of albuterol for prn use on exertion with the following points: 1) saba is for relief of sob that does not improve by walking a slower pace or resting but rather if the pt does not improve after trying this first. 2) If the pt is convinced, as many are, that saba helps recover from activity faster then it's easy to tell if this is the case by re-challenging : ie stop, take the inhaler, then p 5 minutes try the exact same activity (intensity of workload) that just caused the symptoms and see if they are substantially diminished or not after saba 3) if there is an activity that reproducibly causes the symptoms, try the saba 15 min before the activity on alternate days   If in fact the saba really does help, then fine to continue to use it prn but advised may need to look closer at the maintenance regimen being used to achieve better control of airways disease with exertion.

## 2019-02-07 NOTE — Patient Instructions (Addendum)
Patient has MyChart so does not need a copy  Goal for 02 levels is to keep them above 90% at all times   If still short of breath despite adequate 02 levels  >>>> use your albuterol as a rescue medication to be used if you can't catch your breath by resting or doing a relaxed purse lip breathing pattern.  - The less you use it, the better it will work when you need it. - Ok to use up to 1-2 puffs  every 4 hours if you must but call for immediate appointment if use goes up over your usual need - Don't leave home without it !!  (think of it like the spare tire for your car)    Please schedule a follow up visit in 3 months but call sooner if needed  with all medications /inhalers/ solutions in hand so we can verify exactly what you are taking. This includes all medications from all doctors and over the counters

## 2019-02-16 ENCOUNTER — Telehealth: Payer: Self-pay | Admitting: Family Medicine

## 2019-02-16 NOTE — Telephone Encounter (Signed)
Pt dropped off disability parking placard form to be filled out by Dr. Diona Browner. Form placed in rx tower in front office for Dr. Diona Browner. Please mail the form back to patient in envelope included.   Thanks

## 2019-02-16 NOTE — Telephone Encounter (Signed)
Handicap Placard Application placed in Dr. Rometta Emery in box for signature.

## 2019-02-18 NOTE — Telephone Encounter (Signed)
Completed Handicap Placard Application mailed to patient as requested.

## 2019-03-02 ENCOUNTER — Telehealth (HOSPITAL_COMMUNITY): Payer: Self-pay | Admitting: *Deleted

## 2019-03-02 NOTE — Telephone Encounter (Signed)
Pt left VM stating she had bronchitis and would like a call back from our office. I called patient to get more information. No answer/left message for pt to call back.

## 2019-03-03 ENCOUNTER — Encounter: Payer: Self-pay | Admitting: Internal Medicine

## 2019-03-03 ENCOUNTER — Ambulatory Visit (INDEPENDENT_AMBULATORY_CARE_PROVIDER_SITE_OTHER): Payer: Medicare HMO | Admitting: Internal Medicine

## 2019-03-03 VITALS — HR 89

## 2019-03-03 DIAGNOSIS — I272 Pulmonary hypertension, unspecified: Secondary | ICD-10-CM

## 2019-03-03 DIAGNOSIS — J439 Emphysema, unspecified: Secondary | ICD-10-CM | POA: Diagnosis not present

## 2019-03-03 DIAGNOSIS — R05 Cough: Secondary | ICD-10-CM | POA: Diagnosis not present

## 2019-03-03 DIAGNOSIS — I509 Heart failure, unspecified: Secondary | ICD-10-CM

## 2019-03-03 DIAGNOSIS — R059 Cough, unspecified: Secondary | ICD-10-CM

## 2019-03-03 MED ORDER — DOXYCYCLINE HYCLATE 100 MG PO TABS: 100.0000 mg | ORAL_TABLET | Freq: Two times a day (BID) | ORAL | 0 refills | Status: DC

## 2019-03-03 NOTE — Progress Notes (Signed)
Virtual Visit via Video Note  I connected with Linda BRUNKOW on 03/03/19 at  3:00 PM EDT by a telemedicine application and verified that I am speaking with the correct person using two identifiers.  Location: Patient: Home Provider: Office   I discussed the limitations of evaluation and management by telemedicine and the availability of in person appointments. The patient expressed understanding and agreed to proceed.  History of Present Illness:  Pt reports cough. This stared 2-3 days ago. The cough is productive of white mucous. She denies runny nose, nasal congestion, ear pain, sore throat or worsening shortness of breath. She is on 3L oxygen via [place] continuously. She denies fever, chills or body aches. She has a history of pulmonary HTN, CHF and COPD. She is managed on Tadalafil, Trelelgy, Lasix and Ambrisentan. She has not had sick contacts that she is aware of.   Past Medical History:  Diagnosis Date  . Acute respiratory failure with hypoxia and hypercapnea 02/26/2014  . Allergic rhinitis due to pollen   . Allergy   . Arthritis   . Asthma   . CHF (congestive heart failure) ([place])   . COPD (chronic obstructive pulmonary disease) ([place])   . Diastolic dysfunction    a. 02/2014 Echo: EF 65-70%, Gr 1 DD, RVH;  b. 09/2015 Echo: EF 55%, Gr 1 DD.  [name] Emphysema of lung ([place])   . H/O seasonal allergies   . History of chicken pox   . History of tobacco abuse    a. Quit 2015.  [name] History of UTI   . Hypertension   . Hypertensive heart disease   . Lower extremity edema   . Pericardial effusion    a. 09/2015 Echo: Mod eff w/o tamponade.  . Right heart failure with reduced right ventricular function ([place])    a. 09/2015 Echo: EF 55%, Gr 1 DD, D shaped IV septum, sev dil RV with mod reduced fxn, mod dil RA, RV-RA grad 62mHg, PASP 852mg, mod pericard eff w/o tamponade.  . Severe Pulmonary Hypertension    a. 09/2015 Echo: PASP 8717m.    Current Outpatient Medications  Medication Sig  Dispense Refill  . acetaminophen (TYLENOL) 325 MG tablet Take 325 mg by mouth at bedtime as needed for moderate pain or headache.     . aMarland Kitchenetaminophen-codeine (TYLENOL #3) 300-30 MG tablet Take 1 tablet 1 hour prior to procedure then 1 tablet every 4 hours as needed 16 tablet 0  . albuterol (PROVENTIL HFA;VENTOLIN HFA) 108 (90 Base) MCG/ACT inhaler Inhale 2 puffs every 6 (six) hours as needed into the lungs for wheezing or shortness of breath. 1 Inhaler 11  . ambrisentan (LETAIRIS) 10 MG tablet Take 1 tablet (10 mg total) by mouth daily. 30 tablet 6  . aspirin EC 81 MG EC tablet Take 1 tablet (81 mg total) by mouth daily. 30 tablet 0  . clotrimazole (MYCELEX) 10 MG troche Take 1 tablet (10 mg total) by mouth 5 (five) times daily. 40 tablet 1  . fluticasone (FLONASE) 50 MCG/ACT nasal spray SHAKE WELL AND INSTILL 2 SPRAYS INTO EACH NOSTRIL ONCE DAILY 48 g 1  . furosemide (LASIX) 40 MG tablet TAKE 1 TABLET BY MOUTH TWICE A DAY 180 tablet 1  . ipratropium (ATROVENT) 0.06 % nasal spray Place 2 sprays into both nostrils 2 (two) times daily. 45 mL 3  . OXYGEN 2 to 2lpm 24/7    . Polyethyl Glycol-Propyl Glycol (SYSTANE OP) Apply 1-2 drops to eye daily as needed (dry eyes).    .[name]  spironolactone (ALDACTONE) 25 MG tablet Take 0.5 tablets (12.5 mg total) by mouth daily. 45 tablet 3  . tadalafil, PAH, (ADCIRCA) 20 MG tablet Take 2 tablets (40 mg total) by mouth daily. 180 tablet 3  . TRELEGY ELLIPTA 100-62.5-25 MCG/INH AEPB INHALE 2 PUFFS INTO THE LUNGS EVERY DAY 60 each 3   No current facility-administered medications for this visit.     Allergies  Allergen Reactions  . Amoxicillin-Pot Clavulanate     REACTION: gi upset Has patient had a PCN reaction causing immediate rash, facial/tongue/throat swelling, SOB or lightheadedness with hypotension: No Has patient had a PCN reaction causing severe rash involving mucus membranes or skin necrosis: No Has patient had a PCN reaction that required hospitalization :  No Has patient had a PCN reaction occurring within the last 10 years: No If all of the above answers are "NO", then may proceed with Cephalosporin use.   . Cefdinir     REACTION: gi  upset  . Moxifloxacin     REACTION: rash  . Singulair [Montelukast Sodium]     Itching     Family History  Problem Relation Age of Onset  . Glaucoma Brother   . Vascular Disease Father        Died of complications from surgery  . Heart attack Father   . Asthma Mother        Died of asthma attack  . Dementia Mother   . Hyperlipidemia Mother   . Hypertension Mother     Social History   Socioeconomic History  . Marital status: Married    Spouse name: Not on file  . Number of children: Not on file  . Years of education: 2  . Highest education level: Not on file  Occupational History  . Occupation: retired Tour manager  . Financial resource strain: Not on file  . Food insecurity    Worry: Not on file    Inability: Not on file  . Transportation needs    Medical: Not on file    Non-medical: Not on file  Tobacco Use  . Smoking status: Former Smoker    Packs/day: 1.00    Years: 40.00    Pack years: 40.00    Types: Cigarettes    Quit date: 02/22/2014    Years since quitting: 5.0  . Smokeless tobacco: Never Used  Substance and Sexual Activity  . Alcohol use: No  . Drug use: No  . Sexual activity: Never  Lifestyle  . Physical activity    Days per week: Not on file    Minutes per session: Not on file  . Stress: Not on file  Relationships  . Social Herbalist on phone: Not on file    Gets together: Not on file    Attends religious service: Not on file    Active member of club or organization: Not on file    Attends meetings of clubs or organizations: Not on file    Relationship status: Not on file  . Intimate partner violence    Fear of current or ex partner: Not on file    Emotionally abused: Not on file    Physically abused: Not on file    Forced sexual  activity: Not on file  Other Topics Concern  . Not on file  Social History Narrative   Married with 2 children.  Independent of ADLs.      Does not have a living will.   Would desire CPR  but would not want prolonged life support if futile- husband aware.     Constitutional: Denies fever, malaise, fatigue, headache or abrupt weight changes.  HEENT: Denies eye pain, eye redness, ear pain, ringing in the ears, wax buildup, runny nose, nasal congestion, bloody nose, or sore throat. Respiratory: Pt reports cough. Denies difficulty breathing, shortness of breath.   Cardiovascular: Denies chest pain, chest tightness, palpitations or swelling in the hands or feet.    No other specific complaints in a complete review of systems (except as listed in HPI above).    Wt Readings from Last 3 Encounters:  10/25/18 138 lb 12.8 oz (63 kg)  08/16/18 131 lb (59.4 kg)  07/23/18 140 lb (63.5 kg)    Pulmonary/Chest: Normal effort. No respiratory distress.  Neurological: Alert and oriented.   BMET    Component Value Date/Time   NA 141 10/25/2018 1518   K 3.1 (L) 10/25/2018 1518   CL 98 10/25/2018 1518   CO2 35 (H) 10/25/2018 1518   GLUCOSE 114 (H) 10/25/2018 1518   BUN 12 10/25/2018 1518   CREATININE 0.83 10/25/2018 1518   CALCIUM 8.8 (L) 10/25/2018 1518   GFRNONAA >60 10/25/2018 1518   GFRAA >60 10/25/2018 1518    Lipid Panel     Component Value Date/Time   CHOL 181 07/19/2018 1308   TRIG 122.0 07/19/2018 1308   HDL 60.10 07/19/2018 1308   CHOLHDL 3 07/19/2018 1308   VLDL 24.4 07/19/2018 1308   LDLCALC 96 07/19/2018 1308    CBC    Component Value Date/Time   WBC 6.2 08/12/2017 0633   RBC 3.56 (L) 08/12/2017 0633   HGB 10.8 (L) 08/12/2017 0633   HCT 35.1 (L) 08/12/2017 0633   PLT 293 08/12/2017 0633   MCV 98.6 08/12/2017 0633   MCH 30.3 08/12/2017 0633   MCHC 30.8 08/12/2017 0633   RDW 15.2 08/12/2017 0633   LYMPHSABS 1.1 09/11/2015 1616   MONOABS 0.7 09/11/2015 1616    EOSABS 0.1 09/11/2015 1616   BASOSABS 0.0 09/11/2015 1616    Hgb A1C No results found for: HGBA1C     Assessment and Plan:   Follow Up Instructions:    I discussed the assessment and treatment plan with the patient. The patient was provided an opportunity to ask questions and all were answered. The patient agreed with the plan and demonstrated an understanding of the instructions.   The patient was advised to call back or seek an in-person evaluation if the symptoms worsen or if the condition fails to improve as anticipated.  Total time for this encounter was 9 minutes.  Webb Silversmith, NP

## 2019-03-03 NOTE — Patient Instructions (Signed)
Cough, Adult  A cough helps to clear your throat and lungs. A cough may last only 2-3 weeks (acute), or it may last longer than 8 weeks (chronic). Many different things can cause a cough. A cough may be a sign of an illness or another medical condition. Follow these instructions at home:  Pay attention to any changes in your cough.  Take medicines only as told by your doctor. ? If you were prescribed an antibiotic medicine, take it as told by your doctor. Do not stop taking it even if you start to feel better. ? Talk with your doctor before you try using a cough medicine.  Drink enough fluid to keep your pee (urine) clear or pale yellow.  If the air is dry, use a cold steam vaporizer or humidifier in your home.  Stay away from things that make you cough at work or at home.  If your cough is worse at night, try using extra pillows to raise your head up higher while you sleep.  Do not smoke, and try not to be around smoke. If you need help quitting, ask your doctor.  Do not have caffeine.  Do not drink alcohol.  Rest as needed. Contact a doctor if:  You have new problems (symptoms).  You cough up yellow fluid (pus).  Your cough does not get better after 2-3 weeks, or your cough gets worse.  Medicine does not help your cough and you are not sleeping well.  You have pain that gets worse or pain that is not helped with medicine.  You have a fever.  You are losing weight and you do not know why.  You have night sweats. Get help right away if:  You cough up blood.  You have trouble breathing.  Your heartbeat is very fast. This information is not intended to replace advice given to you by your health care provider. Make sure you discuss any questions you have with your health care provider. Document Released: 05/08/2011 Document Revised: 01/31/2016 Document Reviewed: 11/01/2014 Elsevier Interactive Patient Education  2019 [employer].

## 2019-03-08 DIAGNOSIS — J45909 Unspecified asthma, uncomplicated: Secondary | ICD-10-CM | POA: Diagnosis not present

## 2019-03-08 DIAGNOSIS — J449 Chronic obstructive pulmonary disease, unspecified: Secondary | ICD-10-CM | POA: Diagnosis not present

## 2019-03-08 DIAGNOSIS — R062 Wheezing: Secondary | ICD-10-CM | POA: Diagnosis not present

## 2019-03-08 DIAGNOSIS — J9601 Acute respiratory failure with hypoxia: Secondary | ICD-10-CM | POA: Diagnosis not present

## 2019-03-10 ENCOUNTER — Other Ambulatory Visit: Payer: Self-pay | Admitting: Internal Medicine

## 2019-03-17 ENCOUNTER — Other Ambulatory Visit (HOSPITAL_COMMUNITY): Payer: Self-pay | Admitting: Internal Medicine

## 2019-03-19 ENCOUNTER — Other Ambulatory Visit (HOSPITAL_COMMUNITY): Payer: Self-pay | Admitting: Internal Medicine

## 2019-03-24 ENCOUNTER — Other Ambulatory Visit (HOSPITAL_COMMUNITY): Payer: Self-pay | Admitting: *Deleted

## 2019-03-24 MED ORDER — AMBRISENTAN 10 MG PO TABS: ORAL_TABLET | ORAL | 1 refills | Status: DC

## 2019-03-30 ENCOUNTER — Other Ambulatory Visit (HOSPITAL_COMMUNITY): Payer: Self-pay | Admitting: Internal Medicine

## 2019-04-08 DIAGNOSIS — J45909 Unspecified asthma, uncomplicated: Secondary | ICD-10-CM | POA: Diagnosis not present

## 2019-04-08 DIAGNOSIS — R062 Wheezing: Secondary | ICD-10-CM | POA: Diagnosis not present

## 2019-04-08 DIAGNOSIS — J449 Chronic obstructive pulmonary disease, unspecified: Secondary | ICD-10-CM | POA: Diagnosis not present

## 2019-04-08 DIAGNOSIS — J9601 Acute respiratory failure with hypoxia: Secondary | ICD-10-CM | POA: Diagnosis not present

## 2019-05-09 DIAGNOSIS — J9601 Acute respiratory failure with hypoxia: Secondary | ICD-10-CM | POA: Diagnosis not present

## 2019-05-09 DIAGNOSIS — J45909 Unspecified asthma, uncomplicated: Secondary | ICD-10-CM | POA: Diagnosis not present

## 2019-05-09 DIAGNOSIS — R062 Wheezing: Secondary | ICD-10-CM | POA: Diagnosis not present

## 2019-05-09 DIAGNOSIS — J449 Chronic obstructive pulmonary disease, unspecified: Secondary | ICD-10-CM | POA: Diagnosis not present

## 2019-05-10 ENCOUNTER — Encounter: Payer: Self-pay | Admitting: Internal Medicine

## 2019-05-10 ENCOUNTER — Other Ambulatory Visit: Payer: Self-pay

## 2019-05-10 ENCOUNTER — Ambulatory Visit (INDEPENDENT_AMBULATORY_CARE_PROVIDER_SITE_OTHER): Payer: Medicare HMO | Admitting: Internal Medicine

## 2019-05-10 DIAGNOSIS — J9611 Chronic respiratory failure with hypoxia: Secondary | ICD-10-CM | POA: Diagnosis not present

## 2019-05-10 DIAGNOSIS — J9612 Chronic respiratory failure with hypercapnia: Secondary | ICD-10-CM

## 2019-05-10 DIAGNOSIS — J449 Chronic obstructive pulmonary disease, unspecified: Secondary | ICD-10-CM | POA: Diagnosis not present

## 2019-05-10 NOTE — Progress Notes (Signed)
Subjective:   Patient ID: Linda Stevens, female    DOB: 1946/04/06  MRN: [id number]    Brief patient profile:  72yowf quit smoking 02/22/14  with  GOLD II/III  copd/ cor pulmonale vs HP syndrome related to cirrhosis     Procedures: 1/24/17ECHO : EF 29%, grade 1 diastolic dysfx, severe RV dilation, PAS 87 mmHg, mod pericardial effusion 10/04/15  Doppler legs >> negative 10/04/15  V/Q scan >> low probability for PE 10/08/15 Rt/Lt heart cath >> RA 9, RV 97/8/12, PA 104/49/72, PCW 18, CI 2.34, PVR 13.6 10/05/15  PFT >> FEV1 0.91 (40%), FEV1% 56, TLC 4.32 (88%), DLCO 38%, no BD    11/06/2015  Transition of care f/u [name] re: copd gold II/III/ chronic resp failure/ ? Cor pulmonale vs hepatopulmonary syndrome?  Chief Complaint  Patient presents with  . Follow-up    Pt states overall her breathing is doing well. She is currrently taking Dulera, but ins prefers advair.   doe = MMRC2 = can't walk a nl pace on a flat grade s sob even on 02 1.5 lpm sometimes increases to 2 Very confused with details of care / leg swelling resolved  rec Try symbicort 160 Take 2 puffs first thing in am and then another 2 puffs about 12 hours later.  Continue spiriva for now pending follow up pfts Wear the oxygen as much as you can at 1.5 lpm  Please schedule a follow up office visit in 4 weeks, ok to do wlh PFTs same day    12/06/2015  f/u [name] re: GOLD II/III / spiriva/symbiocrt maint  Chief Complaint  Patient presents with  . Follow-up    PFT done at [facility]. Pt c/o min cough with clear sputum- relates to allergies.   no change doe = MMRC2 = can't walk a nl pace on a flat grade s sob  Even on 02  Main new problem is nasal congestion/ neg resp to flonase/ irritated by 02 but can't use humidifier at low flow rate  rec Change symbicort to advair 115 Take 2 puffs first thing in am and then another 2 puffs about 12 hours later. Work on inhaler technique:  Prednisone 10 mg take  4 each am x 2 days,   2 each  am x 2 days,  1 each am x 2 days and stop  I emphasized that nasal steroids have no immediate benefit in terms   Ok to take clariton or allegra as needed for allergies Consult Dr Lake Bells next available re pulmonary hypertension          07/07/2017 acute extended [name] re: COPD  GOLD II/III/ Incruse/advair (unable to name them but sure that's what they are Chief Complaint  Patient presents with  . Follow-up    Pt not feeling well, not breathing well since this past weekend. Pt has lower back pain moving to the sides affecting her breathing, pt has O2 turned up to 3 liters instead of 2 normally.   some raspy coughing first thing in am prod just white mucus  x around a week prior to OV   Last week can't really lie down due to back pain but not pleuritic / assoc with urgency  Has not tried even tylenol for back pain but heat seems to help. Denies trauma Doe minimally worse, better on higher 02 as above  rec   Doxycycline 100 mg twice daily x 10 days  Please remember to go to the lab and x-ray department:  dx new R effusion   thoracentesis  07/15/2017   X 580 cc with wbc's 1974 with L >> P .  Prot 3.5 , nl glucose   Alb 1.9  cyt neg with predominance of T lymphocytes         07/17/2017  f/u [name] re:  COPD II/ III and 02 2.5 increases to 4-5 with ex / new R effusion/ exudate  Chief Complaint  Patient presents with  . Follow-up    Breathing is much improved. She is coughing less sputum is minimal and clear.   sleeps at 45 degrees on 2.5 lpm and able to lie flatter since tap  Overall back near baseline p tap rec Plan A = Automatic = Trelegy one click x two good drags  Stop incruse and advair  Plan B = Backup Only use your albuterol (Proair=Red/ proventil=yellow) as a rescue medication to be used if you can't catch your breath   Please schedule a follow up office visit in 4 weeks, sooner if needed  - late add:  CT chest with contrast needed due to persistent atx on R base> referred  to T surgery > declined intervention, improved with diuresis      04/26/2018  f/u [name] re:  COPD II/ III/ maint trelegy  and 02 on 2.5-3 up to 5lpm when  on treadmill  Chief Complaint  Patient presents with  . Follow-up    SOB with activity and productive cough mucus clear to white.  Dyspnea:  treadmill x 510 steps x 20 min x slt incline tendency is better Cough: minimal white mucus mostly in ams Sleeping: slt angle < 30 degrees  SABA use: not using much  02: as above   rec Ok to try proaire 15 min before you exercise  Work on inhaler technique: Clotrimazole 10 mg troche up to 4 x daily as needed  Please schedule a follow up visit in 3 months but call sooner if needed - bring your drug formulary with you if there are any issues related to your medications so we can help you choose the least expensive.      08/16/2018  f/u [name] re:  COPD II/III spirometry / 02 dep 24/7 with cor pulmonale  Chief Complaint  Patient presents with  . Follow-up    Breathing is about the same. She has used her albuterol inhaler once in the past wk.   Dyspnea:  teadmill 4 x weekly / x 25 min / slt incline/ #3  Cough: no Sleeping: slt elevation with pillows SABA use: rarely need albuterol  rec Goal is to keep the 02 sats above 90% while exercising  Work on inhaler technique:   History of Present Illness: 02/07/19 Dyspnea:  15-20 min on treadmill on "2" slt elevation on 3.5 lpm continuous with sats low 90s Cough: slt hoarse but not cough Sleeping: flat with 2 pillows SABA use: maybe once a day 02: 2.5 lpm hs and feels rested in am/ and 2.5lpm at rest  rec Goal for 02 levels is to keep them above 90% at all times  If still short of breath despite adequate 02 levels  >>>> use your albuterol as a rescue medication to be used if you can't catch your breath    Virtual Visit via Telephone Note 05/10/2019   I connected with Linda Stevens on 05/10/19 at 8:15 AM EDT by telephone and verified  that I am speaking with the correct person using two identifiers.   I discussed  the limitations, risks, security and privacy concerns of performing an evaluation and management service by telephone and the availability of in person appointments. I also discussed with the patient that there may be a patient responsible charge related to this service. The patient expressed understanding and agreed to proceed.   History of Present Illness: Dyspnea:  Treadmill 2-3 mph slt grade on 4lpm at around 90% for 15-20 m qod  Cough: urge to clear throat  Sleeping: flat bed/ 2 pillows  SABA use: rarely saba  02: 2.5 at hs and during the day 2.5-3 during the day    No obvious day to day or daytime variability or assoc excess/ purulent sputum or mucus plugs or hemoptysis or cp or chest tightness, subjective wheeze or overt sinus or hb symptoms.    Also denies any obvious fluctuation of symptoms with weather or environmental changes or other aggravating or alleviating factors except as outlined above.   Meds reviewed/ med reconciliation completed         Observations/Objective:   Assessment and Plan: See problem list for active a/p's   Follow Up Instructions: See avs for instructions unique to this ov which includes revised/ updated med list     I discussed the assessment and treatment plan with the patient. The patient was provided an opportunity to ask questions and all were answered. The patient agreed with the plan and demonstrated an understanding of the instructions.   The patient was advised to call back or seek an in-person evaluation if the symptoms worsen or if the condition fails to improve as anticipated.  I provided 25  minutes of non-face-to-face time during this encounter.   Christinia Gully, MD

## 2019-05-10 NOTE — Patient Instructions (Addendum)
Pt has MyChart   Goal for 02 levels is to keep them above 90% at all times   Work up to 30 min daily walking  - pace yourself slower or at a flatter level if can't do the full 30 minutes  Use arm and hammer toothpaste after trelegy to see if helps you tolerate it better   GERD (REFLUX)  is an extremely common cause of respiratory symptoms just like yours , many times with no obvious heartburn at all and my be contributing to your throat irritation    It can be treated with medication, but also with lifestyle changes including elevation of the head of your bed (ideally with 6 -8inch blocks under the headboard of your bed),  Smoking cessation, avoidance of late meals, excessive alcohol, and avoid fatty foods, chocolate, peppermint, colas, red wine, and acidic juices such as orange juice.  NO MINT OR MENTHOL PRODUCTS SO NO COUGH DROPS  USE SUGARLESS CANDY INSTEAD (Jolley ranchers or Stover's or Life Savers- NOT the white ones!) or even ice chips will also do - the key is to swallow to prevent all throat clearing. NO OIL BASED VITAMINS - use powdered substitutes.  Avoid fish oil when coughing.   Please schedule a follow up visit in 3 months but call sooner if needed  with all medications /inhalers/ solutions in hand so we can verify exactly what you are taking. This includes all medications from all doctors and over the counters

## 2019-05-10 NOTE — Assessment & Plan Note (Addendum)
Quit smoking 02/2014 - PFT's  04/06/14   FEV1 1.36 (57 % ) ratio 63  p 9 % improvement from saba with DLCO  51 % corrects to 71 % for alv volume   - PFT's  10/08/2015  FEV1 0.91 (40 % ) ratio 56  p no % improvement from saba with DLCO 38 % corrects to 63 % for alv volume   - PFT's  12/06/2015  FEV1 1.17  (52 % ) ratio 62  p 16 % improvement from saba p no rx prior to study with DLCO  28 % corrects to 44 % for alv volume   - 07/17/2017   try change to trelegy > improved  - 08/16/2018  After extensive coaching inhaler device,  effectiveness =    75% (trigger early)    Improving on trelegy x for upper airway   >>> rec build up to 30 min walking q d / no change rx other than use baking soda based toothpaste and avoid throat clearing - use hard rock candy when throat irritation develops from inhalers.

## 2019-05-12 ENCOUNTER — Other Ambulatory Visit: Payer: Self-pay

## 2019-05-12 ENCOUNTER — Ambulatory Visit (HOSPITAL_COMMUNITY)
Admission: RE | Admit: 2019-05-12 | Discharge: 2019-05-12 | Disposition: A | Discharge status: Home / Self Care | Payer: Medicare HMO | Source: Ambulatory Visit | Attending: Internal Medicine | Admitting: Internal Medicine

## 2019-05-12 ENCOUNTER — Encounter (HOSPITAL_COMMUNITY): Payer: Self-pay | Admitting: Internal Medicine

## 2019-05-12 VITALS — BP 115/52 | HR 98 | Wt 136.2 lb

## 2019-05-12 DIAGNOSIS — I2729 Other secondary pulmonary hypertension: Secondary | ICD-10-CM | POA: Insufficient documentation

## 2019-05-12 DIAGNOSIS — J9611 Chronic respiratory failure with hypoxia: Secondary | ICD-10-CM | POA: Diagnosis not present

## 2019-05-12 DIAGNOSIS — I110 Hypertensive heart disease with heart failure: Secondary | ICD-10-CM | POA: Insufficient documentation

## 2019-05-12 DIAGNOSIS — I272 Pulmonary hypertension, unspecified: Secondary | ICD-10-CM | POA: Diagnosis not present

## 2019-05-12 DIAGNOSIS — I50810 Right heart failure, unspecified: Secondary | ICD-10-CM | POA: Insufficient documentation

## 2019-05-12 DIAGNOSIS — Z7982 Long term (current) use of aspirin: Secondary | ICD-10-CM | POA: Insufficient documentation

## 2019-05-12 DIAGNOSIS — J90 Pleural effusion, not elsewhere classified: Secondary | ICD-10-CM

## 2019-05-12 DIAGNOSIS — Z8249 Family history of ischemic heart disease and other diseases of the circulatory system: Secondary | ICD-10-CM | POA: Diagnosis not present

## 2019-05-12 DIAGNOSIS — D751 Secondary polycythemia: Secondary | ICD-10-CM | POA: Insufficient documentation

## 2019-05-12 DIAGNOSIS — Z79899 Other long term (current) drug therapy: Secondary | ICD-10-CM | POA: Insufficient documentation

## 2019-05-12 DIAGNOSIS — J439 Emphysema, unspecified: Secondary | ICD-10-CM | POA: Insufficient documentation

## 2019-05-12 DIAGNOSIS — Z9981 Dependence on supplemental oxygen: Secondary | ICD-10-CM | POA: Diagnosis not present

## 2019-05-12 DIAGNOSIS — Z825 Family history of asthma and other chronic lower respiratory diseases: Secondary | ICD-10-CM | POA: Diagnosis not present

## 2019-05-12 DIAGNOSIS — K7460 Unspecified cirrhosis of liver: Secondary | ICD-10-CM | POA: Insufficient documentation

## 2019-05-12 DIAGNOSIS — Z87891 Personal history of nicotine dependence: Secondary | ICD-10-CM | POA: Diagnosis not present

## 2019-05-12 DIAGNOSIS — I2510 Atherosclerotic heart disease of native coronary artery without angina pectoris: Secondary | ICD-10-CM | POA: Diagnosis not present

## 2019-05-12 DIAGNOSIS — M1990 Unspecified osteoarthritis, unspecified site: Secondary | ICD-10-CM | POA: Diagnosis not present

## 2019-05-12 LAB — BASIC METABOLIC PANEL
Anion gap: 11 (ref 5–15)
BUN: 12 mg/dL (ref 8–23)
CO2: 30 mmol/L (ref 22–32)
Calcium: 8.9 mg/dL (ref 8.9–10.3)
Chloride: 98 mmol/L (ref 98–111)
Creatinine, Ser: 0.89 mg/dL (ref 0.44–1.00)
GFR calc Af Amer: >60 mL/min (ref >60)
GFR calc non Af Amer: >60 mL/min (ref >60)
Glucose, Bld: 105 mg/dL — ABNORMAL HIGH (ref 70–99)
Potassium: 3.7 mmol/L (ref 3.5–5.1)
Sodium: 139 mmol/L (ref 135–145)

## 2019-05-12 LAB — BRAIN NATRIURETIC PEPTIDE: B Natriuretic Peptide: 272.8 pg/mL — ABNORMAL HIGH (ref 0.0–100.0)

## 2019-05-12 MED ORDER — TADALAFIL (PAH) 20 MG PO TABS: 40.0000 mg | ORAL_TABLET | Freq: Every day | ORAL | 3 refills | Status: DC

## 2019-05-12 MED ORDER — AMBRISENTAN 10 MG PO TABS: ORAL_TABLET | ORAL | 3 refills | Status: DC

## 2019-05-12 NOTE — Patient Instructions (Signed)
Lab work done today. We will notify you of any abnormal lab work. No news is good news!  A chest x-ray takes a picture of the organs and structures inside the chest, including the heart, lungs, and blood vessels. This test can show several things, including, whether the heart is enlarges; whether fluid is building up in the lungs; and whether pacemaker / defibrillator leads are still in place.   Your physician has requested that you have an echocardiogram. Echocardiography is a painless test that uses sound waves to create images of your heart. It provides your doctor with information about the size and shape of your heart and how well your heart's chambers and valves are working. This procedure takes approximately one hour. There are no restrictions for this procedure. This will be done at your follow up appointment in 6 months.  Please follow up with the [facility] in 6 months. We currently DO NOT have that schedule. If you have not heard from [place] by January 2021, please CALL us at [phone] option #3.  At the [facility], you and your health needs are our priority. As part of our continuing mission to provide you with exceptional heart care, we have created designated Provider Care Teams. These Care Teams include your primary Cardiologist (physician) and Advanced Practice Providers (APPs- Physician Assistants and Nurse Practitioners) who all work together to provide you with the care you need, when you need it.   You may see any of the following providers on your designated Care Team at your next follow up: [name] Dr [name] . Dr Loralie Champagne . Darrick Grinder, NP   Please be sure to bring in all your medications bottles to every appointment.

## 2019-05-12 NOTE — Progress Notes (Signed)
mnnPatient ID: Linda Stevens, female   DOB: March 07, 1946, 73 y.o.   MRN: [id number]  [facility] CLIN[facility] NOTE  Patient ID: Linda Stevens, female   DOB: 1946/06/14, 73 y.o.   MRN: [id number] Primary Cardiologist: Dr Haroldine Laws  Pulmonary: Dr Melvyn Novas   HPI:  [name] is  73 y/o woman with a h/o HTN, COPD (former smoker quit 2015), seasonal allergies, asthma, pulmonary HTN, and diastolic dysfxn.  Admitted January 2017 secondary to progressive DOE, hypoxia, and lower ext edema and found to have severe PAH. R/LHC 10/08/15 with normal coronaries, normal CO, and severe PAH PAP 104/49 with 13.6 WU. PAH well out of proportion to left-sided filling pressures and COPD. Started revatio 20 TID with consideration for macitentan as outpatient. PFTs 10/05/15  FEV1 0.91 (40%)FVC 1.64 (55%), DLCO 8.80 (38%). Auto-immune and infectious serologies negative. VQ negative. Discharge weight 148 pounds.   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 Has been drained twice. Underwent first thoracentesis on 11/7.  Transudative. CT scan 07/24/17 showed large R effusion and small pericardial effusion. +COPD.  Underwent repeat thoracentesis on 07/29/17 with 310cc out.  Cytology negative. F/u CXR 11/21 showed small bilateral effusions.  Referred to Dr. Roxan Hockey for possible Pleurex versus VATS in 12/18 but she did not want to proceed. Ab u/s with cirrhotic features.  RF markedly ++ but other serology negative. Saw Dr. Amil Amen who said she did not have RA but was diagnosed with polyarthralgia/OA.   She presents today for regular follow up. Feels good. Walking on TM 4-5 times per week for 15-20 mins. On TM turns O2 up to 4-5L and keep sats > 90%. Denies CP. + DOE. Wants to walk further but gets tired. No edema, orthopnea. No dizziness.     Echo 4/19 EF 65% RV normal. No effusion Personally reviewed Echo 5/18 EF 60-65% RV completely normal. No TR to measure RVSP.  No effusion  Echo 3/17 EF 60-65% septum still flattened. RV size improved moderate HK. RVSP ~80. IVC small. Pericardial effusion essentially resolved   CXR 09/28/17 Small bilateral effusions  PAH regimen: 1. Adcirca 40 2. Letairis 10 (Switched to [name] as it was thought that sinusitis might be due to macitentan.)  6MW 8/17; 840 ft (256 M) on 2 L O2, sats ranged 83-95%, HR ranged 96-111. 6MW 9/19 780 ft =237.768mters On 3L of O2 Starting O2 94% Ending O2-85%  Starting heart rate-80 Ending heart rate- 110  RHC 12/18 RA = 5 RV = 56/9 PA = 52/17 (33) PCW = 10 Fick cardiac output/index = 7.5/4.5 PVR = 3.5 WU Ao sat = 96% PA sat = 73%, 72% High SVC sat = 76%  VQ scan low prob. CT chest mild COPD. Moderate pericardial effusion. 3v CAD. + cirrhosis. Hepatitis serologies negative.   PFTs  (FEV1 1.23 (52%) with FVC 2.01 (65%) and DLCO 51% in 7/15) PFT's 10/08/2015 FEV1 0.91 (40 %) ratio 56%DLCO 38 % corrects to 63 % for alv volume  PFTs  12/06/15 FEV1 1.0 (44%) FVC 1.74 (58%0 DLCO 28%  R/LHC 10/08/15 with normal coronaries, normal CO, and sever PAH with 13.6 WU.  Findings: Ao = 108/68 (86) LV = 102/10/11 RA = 9 RV = 97/8/12 PA = 104/49 (72) PCW = 18 Fick cardiac output/index = 3.96/2.34 PVR = 13.6 WU FA sat = 93% PA sat = 66%, 69%  RHC 5/18 RA = 2 RV = 48/4 PA = 45/17 (30) PCW = 8 Fick cardiac  output/index = 4.9/3.0 PVR = 4.4 WU Ao sat = 94% PA sat = 68%, 70%  Review of systems complete and found to be negative unless listed in HPI.   SH:  Social History   Socioeconomic History  . Marital status: Married    Spouse name: Not on file  . Number of children: Not on file  . Years of education: 2  . Highest education level: Not on file  Occupational History  . Occupation: retired Tour manager  . Financial resource strain: Not on file  . Food insecurity    Worry: Not on file    Inability: Not on file  . Transportation needs    Medical: Not on file     Non-medical: Not on file  Tobacco Use  . Smoking status: Former Smoker    Packs/day: 1.00    Years: 40.00    Pack years: 40.00    Types: Cigarettes    Quit date: 02/22/2014    Years since quitting: 5.2  . Smokeless tobacco: Never Used  Substance and Sexual Activity  . Alcohol use: No  . Drug use: No  . Sexual activity: Never  Lifestyle  . Physical activity    Days per week: Not on file    Minutes per session: Not on file  . Stress: Not on file  Relationships  . Social Herbalist on phone: Not on file    Gets together: Not on file    Attends religious service: Not on file    Active member of club or organization: Not on file    Attends meetings of clubs or organizations: Not on file    Relationship status: Not on file  . Intimate partner violence    Fear of current or ex partner: Not on file    Emotionally abused: Not on file    Physically abused: Not on file    Forced sexual activity: Not on file  Other Topics Concern  . Not on file  Social History Narrative   Married with 2 children.  Independent of ADLs.      Does not have a living will.   Would desire CPR but would not want prolonged life support if futile- husband aware.    FH:  Family History  Problem Relation Age of Onset  . Glaucoma Brother   . Vascular Disease Father        Died of complications from surgery  . Heart attack Father   . Asthma Mother        Died of asthma attack  . Dementia Mother   . Hyperlipidemia Mother   . Hypertension Mother     Past Medical History:  Diagnosis Date  . Acute respiratory failure with hypoxia and hypercapnea 02/26/2014  . Allergic rhinitis due to pollen   . Allergy   . Arthritis   . Asthma   . CHF (congestive heart failure) ([place])   . COPD (chronic obstructive pulmonary disease) ([place])   . Diastolic dysfunction    a. 02/2014 Echo: EF 65-70%, Gr 1 DD, RVH;  b. 09/2015 Echo: EF 55%, Gr 1 DD.  [name] Emphysema of lung ([place])   . H/O seasonal allergies   .  History of chicken pox   . History of tobacco abuse    a. Quit 2015.  [name] History of UTI   . Hypertension   . Hypertensive heart disease   . Lower extremity edema   . Pericardial effusion  a. 09/2015 Echo: Mod eff w/o tamponade.  . Right heart failure with reduced right ventricular function ([place])    a. 09/2015 Echo: EF 55%, Gr 1 DD, D shaped IV septum, sev dil RV with mod reduced fxn, mod dil RA, RV-RA grad 72mHg, PASP 863mg, mod pericard eff w/o tamponade.  . Severe Pulmonary Hypertension    a. 09/2015 Echo: PASP 8767m.    Current Outpatient Medications  Medication Sig Dispense Refill  . acetaminophen (TYLENOL) 325 MG tablet Take 325 mg by mouth at bedtime as needed for moderate pain or headache.     . albuterol (PROVENTIL HFA;VENTOLIN HFA) 108 (90 Base) MCG/ACT inhaler Inhale 2 puffs every 6 (six) hours as needed into the lungs for wheezing or shortness of breath. 1 Inhaler 11  . ambrisentan (LETAIRIS) 10 MG tablet TAKE 1 TABLET (10MG) BY MOUTH DAILY. DO NOT HANDLE IF PREGNANT. DO NOTSPLIT, CRUSH, OR CHEW. AVOID INHALATION AND CONTACT WITH SKIN OR EYES . CALL [phone] REFILL. 30 tablet 1  . aspirin EC 81 MG EC tablet Take 1 tablet (81 mg total) by mouth daily. 30 tablet 0  . clotrimazole (MYCELEX) 10 MG troche Take 1 tablet (10 mg total) by mouth 5 (five) times daily. 40 tablet 1  . fluticasone (FLONASE) 50 MCG/ACT nasal spray SHAKE WELL AND INSTILL 2 SPRAYS INTO EACH NOSTRIL ONCE DAILY 48 g 1  . furosemide (LASIX) 40 MG tablet TAKE 1 TABLET BY MOUTH TWICE A DAY 180 tablet 1  . ipratropium (ATROVENT) 0.06 % nasal spray Place 2 sprays into both nostrils 2 (two) times daily. 45 mL 3  . OXYGEN 2 to 2lpm 24/7    . Polyethyl Glycol-Propyl Glycol (SYSTANE OP) Apply 1-2 drops to eye daily as needed (dry eyes).    . sMarland Kitchenironolactone (ALDACTONE) 25 MG tablet Take 0.5 tablets (12.5 mg total) by mouth daily. 45 tablet 3  . tadalafil, PAH, (ADCIRCA) 20 MG tablet Take 2 tablets (40 mg total)  by mouth daily. 180 tablet 3  . TRELEGY ELLIPTA 100-62.5-25 MCG/INH AEPB INHALE 2 PUFFS INTO THE LUNGS EVERY DAY 60 each 3   No current facility-administered medications for this encounter.     Vitals:   05/12/19 1142  BP: (!) 115/52  Pulse: 98  SpO2: 99%  Weight: 61.8 kg (136 lb 3.2 oz)   Wt Readings from Last 3 Encounters:  05/12/19 61.8 kg (136 lb 3.2 oz)  10/25/18 63 kg (138 lb 12.8 oz)  08/16/18 59.4 kg (131 lb)     PHYSICAL EXAM: General:  Well appearing on O2 No resp difficulty HEENT: normal + telangectacias  Neck: supple. no JVD. Carotids 2+ bilat; no bruits. No lymphadenopathy or thryomegaly appreciated. Cor: PMI nondisplaced. Regular rate & rhythm. No rubs, gallops or murmurs. Lungs: clear with decreased BS throughout  Abdomen: soft, nontender, nondistended. No hepatosplenomegaly. No bruits or masses. Good bowel sounds. Extremities: no cyanosis, clubbing, rash, edema  Neuro: alert & orientedx3, cranial nerves grossly intact. moves all 4 extremities w/o difficulty. Affect pleasant    ASSESSMENT & PLAN:  1. PAH with right heart failure - suspect combination of WHO Group I and III PAH - Echo 5/18 with complete resolution of RV strain. No significant TR to estimate RVSP - Echo 4/19 with EF 65% no evidence of RV strain  - Repeat RHC 12/18 with mild PAH (mean PA 33) - Doing great. Stable NYHA II. Echo with resolution of RV strain. Will continue O2, Adcirca and letaris  - Continue lasix 40  bid. - Continue spiro 12.5. BMET today.  - Discussed eventual need to add selexipag but PA pressures are low.  - Will repeat echo at 6 months. Check BNP for further risk stratification   2. Recurrent R pleural effusion --Given improvement in PA pressures doubt effusion related to severe PAH. Macitentan often associated with fluid retention but I have not seen it cause pleural effusions. Previous ab CT with evidence of cirrhosis.  -- Fluid analysis = transudative.  -- Effusion much  improved on CXR 12/2017 (small)  -- Rheum work-up negative so far. With diffuse telangectasias will repeat [place]-70 and ANA serologies. 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 Now controlled with with diuresis - Will repeat CXR today  3. Chronic Respiratory failure - Multifactorial. Stable. On 2 liters oxygen - Follows with Dr. Melvyn Novas.   4. COPD- Gold III  - on inhalers. Follows with Pulmonary. As above. No change.  5. Polycythemia suspect due to chronic hypoxia  --Resolved. No change.  6. Cirrhosis on CT --due to RHF. Hepatitis serologies are negative. No change  [name], MD  12:01 PM

## 2019-05-12 NOTE — Addendum Note (Signed)
Encounter addended by: Marlise Eves, RN on: 05/12/2019 12:36 PM  Actions taken: Order list changed, Diagnosis association updated

## 2019-05-12 NOTE — Addendum Note (Signed)
Encounter addended by: Marlise Eves, RN on: 05/12/2019 12:35 PM  Actions taken: Order list changed, Diagnosis association updated, Clinical Note Signed, Charge Capture section accepted

## 2019-05-12 NOTE — Addendum Note (Signed)
Encounter addended by: Scarlette Calico, RN on: 05/12/2019 12:30 PM  Actions taken: Pharmacy for encounter modified, Order list changed

## 2019-06-04 ENCOUNTER — Other Ambulatory Visit (HOSPITAL_COMMUNITY): Payer: Self-pay | Admitting: Internal Medicine

## 2019-06-08 DIAGNOSIS — J45909 Unspecified asthma, uncomplicated: Secondary | ICD-10-CM | POA: Diagnosis not present

## 2019-06-08 DIAGNOSIS — J9601 Acute respiratory failure with hypoxia: Secondary | ICD-10-CM | POA: Diagnosis not present

## 2019-06-08 DIAGNOSIS — R062 Wheezing: Secondary | ICD-10-CM | POA: Diagnosis not present

## 2019-06-08 DIAGNOSIS — J449 Chronic obstructive pulmonary disease, unspecified: Secondary | ICD-10-CM | POA: Diagnosis not present

## 2019-07-07 ENCOUNTER — Other Ambulatory Visit: Payer: Self-pay | Admitting: Internal Medicine

## 2019-07-09 DIAGNOSIS — R062 Wheezing: Secondary | ICD-10-CM | POA: Diagnosis not present

## 2019-07-09 DIAGNOSIS — J45909 Unspecified asthma, uncomplicated: Secondary | ICD-10-CM | POA: Diagnosis not present

## 2019-07-09 DIAGNOSIS — J9601 Acute respiratory failure with hypoxia: Secondary | ICD-10-CM | POA: Diagnosis not present

## 2019-07-09 DIAGNOSIS — J449 Chronic obstructive pulmonary disease, unspecified: Secondary | ICD-10-CM | POA: Diagnosis not present

## 2019-07-14 ENCOUNTER — Ambulatory Visit (INDEPENDENT_AMBULATORY_CARE_PROVIDER_SITE_OTHER): Payer: Medicare HMO

## 2019-07-14 DIAGNOSIS — Z23 Encounter for immunization: Secondary | ICD-10-CM | POA: Diagnosis not present

## 2019-08-08 DIAGNOSIS — J45909 Unspecified asthma, uncomplicated: Secondary | ICD-10-CM | POA: Diagnosis not present

## 2019-08-08 DIAGNOSIS — J9601 Acute respiratory failure with hypoxia: Secondary | ICD-10-CM | POA: Diagnosis not present

## 2019-08-08 DIAGNOSIS — R062 Wheezing: Secondary | ICD-10-CM | POA: Diagnosis not present

## 2019-08-08 DIAGNOSIS — J449 Chronic obstructive pulmonary disease, unspecified: Secondary | ICD-10-CM | POA: Diagnosis not present

## 2019-08-26 ENCOUNTER — Other Ambulatory Visit: Payer: Self-pay

## 2019-08-26 ENCOUNTER — Encounter: Payer: Self-pay | Admitting: Internal Medicine

## 2019-08-26 ENCOUNTER — Ambulatory Visit (INDEPENDENT_AMBULATORY_CARE_PROVIDER_SITE_OTHER): Payer: Medicare HMO | Admitting: Internal Medicine

## 2019-08-26 DIAGNOSIS — J9611 Chronic respiratory failure with hypoxia: Secondary | ICD-10-CM

## 2019-08-26 DIAGNOSIS — J9612 Chronic respiratory failure with hypercapnia: Secondary | ICD-10-CM | POA: Diagnosis not present

## 2019-08-26 DIAGNOSIS — J449 Chronic obstructive pulmonary disease, unspecified: Secondary | ICD-10-CM

## 2019-08-26 MED ORDER — ANORO ELLIPTA 62.5-25 MCG/INH IN AEPB: 1.0000 | INHALATION_SPRAY | Freq: Once | RESPIRATORY_TRACT | 0 refills | Status: DC

## 2019-08-26 MED ORDER — ANORO ELLIPTA 62.5-25 MCG/INH IN AEPB: 1.0000 | INHALATION_SPRAY | Freq: Once | RESPIRATORY_TRACT | 11 refills | Status: AC

## 2019-08-26 NOTE — Assessment & Plan Note (Signed)
Discharged on 02  10/09/15 p admit  - HCO3  On 01/26/17  = 32  - HC03   On 07/20/2017   = 34  - HC03   On 09/26/17         = 34 - HC03   On 07/19/18       = 37   As of 05/10/2019  2.5 lpm sleep and at rest and adjust with ex to keep > 90%   Adequate control on present rx, reviewed in detail with pt > no change in rx needed

## 2019-08-26 NOTE — Progress Notes (Signed)
Subjective:   Patient ID: Linda Stevens, female    DOB: [birth year]  MRN: [id number]    Brief patient profile:  [id number] quit smoking 02/22/14  with  GOLD II/III  copd/ cor pulmonale vs HP syndrome related to cirrhosis     Procedures: 1/24/17ECHO : EF 60%, grade 1 diastolic dysfx, severe RV dilation, PAS 87 mmHg, mod pericardial effusion 10/04/15  Doppler legs >> negative 10/04/15  V/Q scan >> low probability for PE 10/08/15 Rt/Lt heart cath >> RA 9, RV 97/8/12, PA 104/49/72, PCW 18, CI 2.34, PVR 13.6 10/05/15  PFT >> FEV1 0.91 (40%), FEV1% 56, TLC 4.32 (88%), DLCO 38%, no BD    11/06/2015  Transition of care f/u [name] re: copd gold II/III/ chronic resp failure/ ? Cor pulmonale vs hepatopulmonary syndrome?  Chief Complaint  Patient presents with  . Follow-up    Pt states overall her breathing is doing well. She is currrently taking Dulera, but ins prefers advair.   doe = MMRC2 = can't walk a nl pace on a flat grade s sob even on 02 1.5 lpm sometimes increases to 2 Very confused with details of care / leg swelling resolved  rec Try symbicort 160 Take 2 puffs first thing in am and then another 2 puffs about 12 hours later.  Continue spiriva for now pending follow up pfts Wear the oxygen as much as you can at 1.5 lpm  Please schedule a follow up office visit in 4 weeks, ok to do wlh PFTs same day    12/06/2015  f/u [name] re: GOLD II/III / spiriva/symbiocrt maint  Chief Complaint  Patient presents with  . Follow-up    PFT done at [facility]. Pt c/o min cough with clear sputum- relates to allergies.   no change doe = MMRC2 = can't walk a nl pace on a flat grade s sob  Even on 02  Main new problem is nasal congestion/ neg resp to flonase/ irritated by 02 but can't use humidifier at low flow rate  rec Change symbicort to advair 115 Take 2 puffs first thing in am and then another 2 puffs about 12 hours later. Work on inhaler technique:  Prednisone 10 mg take  4 each am x 2 days,   2 each  am x 2 days,  1 each am x 2 days and stop  I emphasized that nasal steroids have no immediate benefit in terms   Ok to take clariton or allegra as needed for allergies Consult Dr [place] Bells next available re pulmonary hypertension          07/07/2017 acute extended [name] re: COPD  GOLD II/III/ Incruse/advair (unable to name them but sure that's what they are Chief Complaint  Patient presents with  . Follow-up    Pt not feeling well, not breathing well since this past weekend. Pt has lower back pain moving to the sides affecting her breathing, pt has O2 turned up to 3 liters instead of 2 normally.   some raspy coughing first thing in am prod just white mucus  x around a week prior to OV   Last week can't really lie down due to back pain but not pleuritic / assoc with urgency  Has not tried even tylenol for back pain but heat seems to help. Denies trauma Doe minimally worse, better on higher 02 as above  rec Doxycycline 100 mg twice daily x 10 days  Please remember to go to the lab and x-ray department: dx new  R effusion   thoracentesis  07/15/2017   X 580 cc with wbc's 1974 with L >> P .  Prot 3.5 , nl glucose   Alb 1.9  cyt neg with predominance of T lymphocytes         07/17/2017  f/u [name] re:  COPD II/ III and 02 2.5 increases to 4-5 with ex / new R effusion/ exudate  Chief Complaint  Patient presents with  . Follow-up    Breathing is much improved. She is coughing less sputum is minimal and clear.   sleeps at 45 degrees on 2.5 lpm and able to lie flatter since tap  Overall back near baseline p tap rec Plan A = Automatic = Trelegy one click x two good drags  Stop incruse and advair  Plan B = Backup Only use your albuterol (Proair=Red/ proventil=yellow) as a rescue medication to be used if you can't catch your breath   Please schedule a follow up office visit in 4 weeks, sooner if needed  - late add:  CT chest with contrast needed due to persistent atx on R base> referred  to T surgery > declined intervention, improved with diuresis      04/26/2018  f/u [name] re:  COPD II/ III/ maint trelegy  and 02 on 2.5-3 up to 5lpm when  on treadmill  Chief Complaint  Patient presents with  . Follow-up    SOB with activity and productive cough mucus clear to white.  Dyspnea:  treadmill x 510 steps x 20 min x slt incline tendency is better Cough: minimal white mucus mostly in ams Sleeping: slt angle < 30 degrees  SABA use: not using much  02: as above   rec Ok to try proaire 15 min before you exercise  Work on inhaler technique: Clotrimazole 10 mg troche up to 4 x daily as needed  Please schedule a follow up visit in 3 months but call sooner if needed - bring your drug formulary with you if there are any issues related to your medications so we can help you choose the least expensive.      08/16/2018  f/u [name] re:  COPD II/III spirometry / 02 dep 24/7 with cor pulmonale  Chief Complaint  Patient presents with  . Follow-up    Breathing is about the same. She has used her albuterol inhaler once in the past wk.   Dyspnea:  teadmill 4 x weekly / x 25 min / slt incline/ #3  Cough: no Sleeping: slt elevation with pillows SABA use: rarely need albuterol  rec Goal is to keep the 02 sats above 90% while exercising  Work on inhaler technique:   History of Present Illness: 02/07/19 Dyspnea:  15-20 min on treadmill on "2" slt elevation on 3.5 lpm continuous with sats low 90s Cough: slt hoarse but not cough Sleeping: flat with 2 pillows SABA use: maybe once a day 02: 2.5 lpm hs and feels rested in am/ and 2.5lpm at rest  rec Goal for 02 levels is to keep them above 90% at all times  If still short of breath despite adequate 02 levels  >>>> use your albuterol as a rescue medication to be used if you can't catch your breath    Virtual Visit via Telephone Note 05/10/2019  History of Present Illness: Dyspnea:  Treadmill 2-3 mph slt grade on 4lpm at around 90% for  15-20 m qod  Cough: urge to clear throat  Sleeping: flat bed/ 2 pillows  SABA  use: rarely saba  02: 2.5 at hs and during the day 2.5-3 during the day  rec Goal for 02 levels is to keep them above 90% at all times  Work up to 30 min daily walking  - pace yourself slower or at a flatter level if can't do the full 30 minutes Use arm and hammer toothpaste after trelegy to see if helps you tolerate it better  GERD  Diet  Please schedule a follow up visit in 3 months     Virtual Visit via Telephone Note 08/26/2019   I connected with Linda Stevens on 08/26/19 at 10:00 AM EST by telephone and verified that I am speaking with the correct person using two identifiers.   I discussed the limitations, risks, security and privacy concerns of performing an evaluation and management service by telephone and the availability of in person appointments. I also discussed with the patient that there may be a patient responsible charge related to this service. The patient expressed understanding and agreed to proceed.   History of Present Illness: Dyspnea:  gxt 2-90mh slt incline on 4lpm x 15 min with sats ok Cough: minimal / trelegy makes her cough a bit  Sleeping: pillows x 2 bed is flat SABA use: rarely  02: sleeping on 2.5 lpm and up to 4lpm ex  Concerned about toe fungus/hoarseness ? From trelegy    No obvious day to day or daytime variability or assoc excess/ purulent sputum or mucus plugs or hemoptysis or cp or chest tightness, subjective wheeze or overt sinus or hb symptoms.    Also denies any obvious fluctuation of symptoms with weather or environmental changes or other aggravating or alleviating factors except as outlined above.   Meds reviewed/ med reconciliation completed        Observations/Objective: Mild hoarseness/ squeaky voice    Assessment and Plan: See problem list for active a/p's   Follow Up Instructions: See avs for instructions unique to this ov which includes  revised/ updated med list     I discussed the assessment and treatment plan with the patient. The patient was provided an opportunity to ask questions and all were answered. The patient agreed with the plan and demonstrated an understanding of the instructions.   The patient was advised to call back or seek an in-person evaluation if the symptoms worsen or if the condition fails to improve as anticipated.  I provided 25 minutes of non-face-to-face time during this encounter.   MChristinia Gully MD

## 2019-08-26 NOTE — Assessment & Plan Note (Signed)
Discharged on 02  10/09/15 p admit  - HCO3  On 01/26/17  = 32  - HC03   On 07/20/2017   = 34  - HC03   On 09/26/17         = 34 - HC03   On 07/19/18       = 37   As of 05/10/2019  2.5 lpm sleep and at rest and adjust with ex to keep > 90%   Advised:  Make sure you check your oxygen saturations at highest level of activity to be sure it stays over 90% and adjust upward to maintain this level if needed but remember to turn it back to previous settings when you stop (to conserve your supply).   .Each maintenance medication was reviewed in detail including most importantly the difference between maintenance and as needed and under what circumstances the prns are to be used.  Please see AVS for specific  Instructions which are unique to this visit and I personally typed out  which were reviewed in detail over the phone with the patient and a copy provided via MyChart

## 2019-08-26 NOTE — Assessment & Plan Note (Addendum)
Quit smoking 02/2014 - PFT's  04/06/14   FEV1 1.36 (57 % ) ratio 63  p 9 % improvement from saba with DLCO  51 % corrects to 71 % for alv volume   - PFT's  10/08/2015  FEV1 0.91 (40 % ) ratio 56  p no % improvement from saba with DLCO 38 % corrects to 63 % for alv volume   - PFT's  12/06/2015  FEV1 1.17  (52 % ) ratio 62  p 16 % improvement from saba p no rx prior to study with DLCO  28 % corrects to 44 % for alv volume   - 07/17/2017   try change to trelegy > improved  - 08/16/2018  After extensive coaching inhaler device,  effectiveness =    75% (trigger early)  - 08/26/2019 rec change to Anoro p 09/08/2018 due to nail fungus    Pt is Group B in terms of symptom/risk and laba/lama therefore appropriate rx at this point >>>  Should do just as well on Anoro and avoid the ICS  Advised:  formulary restrictions will be an ongoing challenge for the forseable future and I would be happy to pick an alternative if the pt will first  provide me a list of them -  pt  will need to return here for training for any new device that is required eg dpi vs hfa vs respimat.    In the meantime we can always provide samples so that the patient never runs out of any needed respiratory medications.   Pt informed of the seriousness of COVID 19 infection as a direct risk to their health  and safey and to those of their loved ones and should continue to wear facemask in public and minimize exposure to public locations but especially avoid any area or activity where non-close contacts are not observing distancing or wearing an appropriate face mask >> strongly rec covid 19 vaccines when offered.    >>> f/u in 6 m to avoid interim exposure to the virus.   Each maintenance medication was reviewed in detail including most importantly the difference between maintenance and as needed and under what circumstances the prns are to be used.  Please see AVS for specific  Instructions which are unique to this visit and I personally typed  out  which were reviewed in detail over the phone with the patient and a copy provided via [place].

## 2019-08-26 NOTE — Patient Instructions (Signed)
After the fist of the year, try Anoro one click each instead of Trelegy to see if your exercise or your need albuterol    Albuterol is a "rescue medication"  for relief of breathlessness  that does not improve by walking a slower pace or resting  for a few minutes (it doesn't really start working for 5 minutes anyway so ok to wait to see if resting helps)  However, if you are convinced, as many are, that albuterol  helps recover from activity faster then it's easy to tell if this is the case by re-challenging : stop, take the inhaler, then 5 minutes later try the exact same activity (intensity of workload) that just caused the symptoms and see if they are better by using the albuterol prior to exertion.  If  there is an activity that reproducibly causes the breathing problem every time you attempt it, try the albuterol  (either inhaler or nebulizer)  15 min before the activity on alternate days to see if there really is a difference and let me know what you find.    Please schedule a follow up visit in 6 months but call sooner if needed.

## 2019-08-29 ENCOUNTER — Other Ambulatory Visit: Payer: Self-pay | Admitting: Internal Medicine

## 2019-09-08 DIAGNOSIS — J45909 Unspecified asthma, uncomplicated: Secondary | ICD-10-CM | POA: Diagnosis not present

## 2019-09-08 DIAGNOSIS — R062 Wheezing: Secondary | ICD-10-CM | POA: Diagnosis not present

## 2019-09-08 DIAGNOSIS — J449 Chronic obstructive pulmonary disease, unspecified: Secondary | ICD-10-CM | POA: Diagnosis not present

## 2019-09-08 DIAGNOSIS — J9601 Acute respiratory failure with hypoxia: Secondary | ICD-10-CM | POA: Diagnosis not present

## 2019-09-26 ENCOUNTER — Telehealth: Payer: Self-pay

## 2019-09-26 NOTE — Telephone Encounter (Signed)
Patient is returning phone call.  Patient phone number is [phone].

## 2019-09-26 NOTE — Telephone Encounter (Signed)
Called and spoke with pt to verify which inhaler she was taking and pt said she is only taking the Trelegy. Pt was originally taking the anoro but began to have side effects while on it as she was having a rapid heartrate and also had numbness of lips. Due to this, pt did stop taking the anoro. Pt picked up the Trelegy Rx and is doing fine with this inhaler so far. Stated to pt to not take the anoro and to only take the Trelegy as she is doing and she verbalized understanding. Nothing further needed.

## 2019-09-26 NOTE — Telephone Encounter (Signed)
Received a PA denial request for Trelegy, stating that PA was denied because drug is covered by insurance at lowest tier possible for brand name inhalers. Upon further viewing of pt's chart, it appears that pt was supposed to start using Anoro at the start of the year.   Called pharmacy who verified that pt last picked up Anoro on 08/26/2019 and then picked up Trelegy on 09/22/2019.    Called and lmtcb X1 for pt to ensure that she is not taking both of these inhalers at once, and to see why she switched back to Trelegy.   Will await call back.

## 2019-09-27 ENCOUNTER — Telehealth (HOSPITAL_COMMUNITY): Payer: Self-pay | Admitting: Pharmacist

## 2019-09-27 NOTE — Telephone Encounter (Signed)
Patient Advocate Encounter   Received notification from [facility] that prior authorization for Tadalafil is required.   PA submitted via fax Status is pending   Will continue to follow.  Audry Riles, PharmD, BCPS, BCCP, CPP Heart Failure Clinic Pharmacist [phone]

## 2019-10-09 DIAGNOSIS — J9601 Acute respiratory failure with hypoxia: Secondary | ICD-10-CM | POA: Diagnosis not present

## 2019-10-09 DIAGNOSIS — R062 Wheezing: Secondary | ICD-10-CM | POA: Diagnosis not present

## 2019-10-09 DIAGNOSIS — J449 Chronic obstructive pulmonary disease, unspecified: Secondary | ICD-10-CM | POA: Diagnosis not present

## 2019-10-09 DIAGNOSIS — J45909 Unspecified asthma, uncomplicated: Secondary | ICD-10-CM | POA: Diagnosis not present

## 2019-10-16 ENCOUNTER — Other Ambulatory Visit (HOSPITAL_COMMUNITY): Payer: Self-pay | Admitting: Internal Medicine

## 2019-10-18 NOTE — Telephone Encounter (Signed)
Advanced Heart Failure Patient Advocate Encounter  Prior Authorization for Tadalafil has been approved.     Audry Riles, PharmD, BCPS, BCCP, CPP Heart Failure Clinic Pharmacist [phone]

## 2019-11-06 DIAGNOSIS — J449 Chronic obstructive pulmonary disease, unspecified: Secondary | ICD-10-CM | POA: Diagnosis not present

## 2019-11-06 DIAGNOSIS — R062 Wheezing: Secondary | ICD-10-CM | POA: Diagnosis not present

## 2019-11-06 DIAGNOSIS — J45909 Unspecified asthma, uncomplicated: Secondary | ICD-10-CM | POA: Diagnosis not present

## 2019-11-06 DIAGNOSIS — J9601 Acute respiratory failure with hypoxia: Secondary | ICD-10-CM | POA: Diagnosis not present

## 2019-11-17 ENCOUNTER — Other Ambulatory Visit: Payer: Self-pay | Admitting: Internal Medicine

## 2019-12-07 DIAGNOSIS — J9601 Acute respiratory failure with hypoxia: Secondary | ICD-10-CM | POA: Diagnosis not present

## 2019-12-07 DIAGNOSIS — J45909 Unspecified asthma, uncomplicated: Secondary | ICD-10-CM | POA: Diagnosis not present

## 2019-12-07 DIAGNOSIS — J449 Chronic obstructive pulmonary disease, unspecified: Secondary | ICD-10-CM | POA: Diagnosis not present

## 2019-12-07 DIAGNOSIS — R062 Wheezing: Secondary | ICD-10-CM | POA: Diagnosis not present

## 2019-12-09 ENCOUNTER — Other Ambulatory Visit: Payer: Self-pay

## 2019-12-09 ENCOUNTER — Ambulatory Visit (HOSPITAL_COMMUNITY)
Admission: RE | Admit: 2019-12-09 | Discharge: 2019-12-09 | Disposition: A | Discharge status: Home / Self Care | Payer: Medicare HMO | Source: Ambulatory Visit | Attending: Internal Medicine | Admitting: Internal Medicine

## 2019-12-09 ENCOUNTER — Ambulatory Visit (HOSPITAL_BASED_OUTPATIENT_CLINIC_OR_DEPARTMENT_OTHER)
Admission: RE | Admit: 2019-12-09 | Discharge: 2019-12-09 | Disposition: A | Discharge status: Home / Self Care | Payer: Medicare HMO | Source: Ambulatory Visit | Attending: Internal Medicine | Admitting: Internal Medicine

## 2019-12-09 ENCOUNTER — Encounter (HOSPITAL_COMMUNITY): Payer: Self-pay | Admitting: Internal Medicine

## 2019-12-09 VITALS — BP 128/44 | HR 98 | Ht 63.5 in | Wt 138.0 lb

## 2019-12-09 DIAGNOSIS — K7460 Unspecified cirrhosis of liver: Secondary | ICD-10-CM | POA: Diagnosis not present

## 2019-12-09 DIAGNOSIS — R05 Cough: Secondary | ICD-10-CM | POA: Diagnosis not present

## 2019-12-09 DIAGNOSIS — J90 Pleural effusion, not elsewhere classified: Secondary | ICD-10-CM | POA: Insufficient documentation

## 2019-12-09 DIAGNOSIS — J9611 Chronic respiratory failure with hypoxia: Secondary | ICD-10-CM

## 2019-12-09 DIAGNOSIS — I110 Hypertensive heart disease with heart failure: Secondary | ICD-10-CM | POA: Diagnosis not present

## 2019-12-09 DIAGNOSIS — I3139 Other pericardial effusion (noninflammatory): Secondary | ICD-10-CM

## 2019-12-09 DIAGNOSIS — I50810 Right heart failure, unspecified: Secondary | ICD-10-CM | POA: Diagnosis not present

## 2019-12-09 DIAGNOSIS — Z7982 Long term (current) use of aspirin: Secondary | ICD-10-CM | POA: Insufficient documentation

## 2019-12-09 DIAGNOSIS — R0602 Shortness of breath: Secondary | ICD-10-CM | POA: Diagnosis not present

## 2019-12-09 DIAGNOSIS — I2721 Secondary pulmonary arterial hypertension: Secondary | ICD-10-CM | POA: Insufficient documentation

## 2019-12-09 DIAGNOSIS — Z79899 Other long term (current) drug therapy: Secondary | ICD-10-CM | POA: Diagnosis not present

## 2019-12-09 DIAGNOSIS — Z8249 Family history of ischemic heart disease and other diseases of the circulatory system: Secondary | ICD-10-CM | POA: Diagnosis not present

## 2019-12-09 DIAGNOSIS — I272 Pulmonary hypertension, unspecified: Secondary | ICD-10-CM

## 2019-12-09 DIAGNOSIS — J449 Chronic obstructive pulmonary disease, unspecified: Secondary | ICD-10-CM | POA: Insufficient documentation

## 2019-12-09 DIAGNOSIS — Z9981 Dependence on supplemental oxygen: Secondary | ICD-10-CM | POA: Insufficient documentation

## 2019-12-09 DIAGNOSIS — J9610 Chronic respiratory failure, unspecified whether with hypoxia or hypercapnia: Secondary | ICD-10-CM | POA: Insufficient documentation

## 2019-12-09 DIAGNOSIS — M1990 Unspecified osteoarthritis, unspecified site: Secondary | ICD-10-CM | POA: Insufficient documentation

## 2019-12-09 DIAGNOSIS — I700 Atherosclerosis of aorta: Secondary | ICD-10-CM | POA: Diagnosis not present

## 2019-12-09 DIAGNOSIS — Z87440 Personal history of urinary (tract) infections: Secondary | ICD-10-CM | POA: Insufficient documentation

## 2019-12-09 DIAGNOSIS — I5189 Other ill-defined heart diseases: Secondary | ICD-10-CM

## 2019-12-09 DIAGNOSIS — Z825 Family history of asthma and other chronic lower respiratory diseases: Secondary | ICD-10-CM | POA: Insufficient documentation

## 2019-12-09 DIAGNOSIS — I2510 Atherosclerotic heart disease of native coronary artery without angina pectoris: Secondary | ICD-10-CM | POA: Diagnosis not present

## 2019-12-09 DIAGNOSIS — I313 Pericardial effusion (noninflammatory): Secondary | ICD-10-CM | POA: Diagnosis not present

## 2019-12-09 DIAGNOSIS — I509 Heart failure, unspecified: Secondary | ICD-10-CM | POA: Insufficient documentation

## 2019-12-09 DIAGNOSIS — Z8349 Family history of other endocrine, nutritional and metabolic diseases: Secondary | ICD-10-CM | POA: Insufficient documentation

## 2019-12-09 DIAGNOSIS — Z87891 Personal history of nicotine dependence: Secondary | ICD-10-CM | POA: Insufficient documentation

## 2019-12-09 LAB — CBC
HCT: 36.5 % (ref 36.0–46.0)
Hemoglobin: 11.5 g/dL — ABNORMAL LOW (ref 12.0–15.0)
MCH: 31.4 pg (ref 26.0–34.0)
MCHC: 31.5 g/dL (ref 30.0–36.0)
MCV: 99.7 fL (ref 80.0–100.0)
Platelets: 145 10*3/uL — ABNORMAL LOW (ref 150–400)
RBC: 3.66 MIL/uL — ABNORMAL LOW (ref 3.87–5.11)
RDW: 16 % — ABNORMAL HIGH (ref 11.5–15.5)
WBC: 5.2 10*3/uL (ref 4.0–10.5)
nRBC: 0 % (ref 0.0–0.2)

## 2019-12-09 LAB — COMPREHENSIVE METABOLIC PANEL
ALT: 14 U/L (ref 0–44)
AST: 22 U/L (ref 15–41)
Albumin: 3.4 g/dL — ABNORMAL LOW (ref 3.5–5.0)
Alkaline Phosphatase: 94 U/L (ref 38–126)
Anion gap: 12 (ref 5–15)
BUN: 13 mg/dL (ref 8–23)
CO2: 31 mmol/L (ref 22–32)
Calcium: 8.9 mg/dL (ref 8.9–10.3)
Chloride: 96 mmol/L — ABNORMAL LOW (ref 98–111)
Creatinine, Ser: 0.88 mg/dL (ref 0.44–1.00)
GFR calc Af Amer: >60 mL/min (ref >60)
GFR calc non Af Amer: >60 mL/min (ref >60)
Glucose, Bld: 146 mg/dL — ABNORMAL HIGH (ref 70–99)
Potassium: 3.6 mmol/L (ref 3.5–5.1)
Sodium: 139 mmol/L (ref 135–145)
Total Bilirubin: 0.5 mg/dL (ref 0.3–1.2)
Total Protein: 6.8 g/dL (ref 6.5–8.1)

## 2019-12-09 LAB — BRAIN NATRIURETIC PEPTIDE: B Natriuretic Peptide: 324.2 pg/mL — ABNORMAL HIGH (ref 0.0–100.0)

## 2019-12-09 NOTE — Progress Notes (Signed)
mnnPatient ID: [name], female   DOB: May 16, 1946, 74 y.o.   MRN: [id number]  ADVANCED HF CLINIC NOTE  Patient ID: KONSTANCE HAPPEL, female   DOB: Nov 18, 1945, 74 y.o.   MRN: [id number] Primary Cardiologist: Dr Haroldine Laws  Pulmonary: Dr Melvyn Novas   HPI:  Ms. Limon is  74 y/o woman with a h/o HTN, COPD (former smoker quit 2015), seasonal allergies, asthma, pulmonary HTN, and diastolic dysfxn.  Admitted January 2017 secondary to progressive DOE, hypoxia, and lower ext edema and found to have severe PAH. R/LHC 10/08/15 with normal coronaries, normal CO, and severe PAH PAP 104/49 with 13.6 WU. PAH well out of proportion to left-sided filling pressures and COPD. Started revatio 20 TID with consideration for macitentan as outpatient. PFTs 10/05/15  FEV1 0.91 (40%)FVC 1.64 (55%), DLCO 8.80 (38%). Auto-immune and infectious serologies negative. VQ negative. Discharge weight 148 pounds.   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 Has been drained twice. Underwent first thoracentesis on 11/7.  Transudative. CT scan 07/24/17 showed large R effusion and small pericardial effusion. +COPD.  Underwent repeat thoracentesis on 07/29/17 with 310cc out.  Cytology negative. F/u CXR 11/21 showed small bilateral effusions.  Referred to Dr. Roxan Hockey for possible Pleurex versus VATS in 12/18 but she did not want to proceed. Ab u/s with cirrhotic features.  RF markedly ++ but other serology negative. Saw Dr. Amil Amen who said she did not have RA but was diagnosed with polyarthralgia/OA.   She presents today for regular follow up. Continues to feel well. Walking on TM regularly without problem. Breathing good. Wears 3L O2. No CP or edema. Complaint with meds   Echo today LVEF 70-75% RV normal . No effusion. Personally reviewed   Echo 4/19 EF 65% RV normal. No effusion Personally reviewed Echo 5/18 EF 60-65% RV completely normal. No TR to measure RVSP.  No effusion  Echo 3/17 EF 60-65% septum still flattened. RV size improved moderate HK. RVSP ~80. IVC small. Pericardial effusion essentially resolved   CXR 09/28/17 Small bilateral effusions  PAH regimen: 1. Adcirca 40 2. Letairis 10 (Switched to [name] as it was thought that sinusitis might be due to macitentan.)  6MW 8/17; 840 ft (256 M) on 2 L O2, sats ranged 83-95%, HR ranged 96-111. 6MW 9/19 780 ft =237.755mters On 3L of O2 Starting O2 94% Ending O2-85%  Starting heart rate-80 Ending heart rate- 110  RHC 12/18 RA = 5 RV = 56/9 PA = 52/17 (33) PCW = 10 Fick cardiac output/index = 7.5/4.5 PVR = 3.5 WU Ao sat = 96% PA sat = 73%, 72% High SVC sat = 76%  VQ scan low prob. CT chest mild COPD. Moderate pericardial effusion. 3v CAD. + cirrhosis. Hepatitis serologies negative.   PFTs  (FEV1 1.23 (52%) with FVC 2.01 (65%) and DLCO 51% in 7/15) PFT's 10/08/2015 FEV1 0.91 (40 %) ratio 56%DLCO 38 % corrects to 63 % for alv volume  PFTs  12/06/15 FEV1 1.0 (44%) FVC 1.74 (58%0 DLCO 28%  R/LHC 10/08/15 with normal coronaries, normal CO, and sever PAH with 13.6 WU.  Findings: Ao = 108/68 (86) LV = 102/10/11 RA = 9 RV = 97/8/12 PA = 104/49 (72) PCW = 18 Fick cardiac output/index = 3.96/2.34 PVR = 13.6 WU FA sat = 93% PA sat = 66%, 69%  RHC 5/18 RA = 2 RV = 48/4 PA = 45/17 (30) PCW = 8 Fick cardiac output/index = 4.9/3.0 PVR = 4.4 WU  Ao sat = 94% PA sat = 68%, 70%  Review of systems complete and found to be negative unless listed in HPI.   SH:  Social History   Socioeconomic History  . Marital status: Married    Spouse name: Not on file  . Number of children: Not on file  . Years of education: 2  . Highest education level: Not on file  Occupational History  . Occupation: retired Pharmacist, hospital  Tobacco Use  . Smoking status: Former Smoker    Packs/day: 1.00    Years: 40.00    Pack years: 40.00    Types: Cigarettes    Quit date: 02/22/2014    Years since quitting:  5.7  . Smokeless tobacco: Never Used  Substance and Sexual Activity  . Alcohol use: No  . Drug use: No  . Sexual activity: Never  Other Topics Concern  . Not on file  Social History Narrative   Married with 2 children.  Independent of ADLs.      Does not have a living will.   Would desire CPR but would not want prolonged life support if futile- husband aware.   Social Determinants of Health   Financial Resource Strain:   . Difficulty of Paying Living Expenses:   Food Insecurity:   . Worried About Charity fundraiser in the Last Year:   . Arboriculturist in the Last Year:   Transportation Needs:   . Film/video editor (Medical):   [name] Lack of Transportation (Non-Medical):   Physical Activity:   . Days of Exercise per Week:   . Minutes of Exercise per Session:   Stress:   . Feeling of Stress :   Social Connections:   . Frequency of Communication with Friends and Family:   . Frequency of Social Gatherings with Friends and Family:   . Attends Religious Services:   . Active Member of Clubs or Organizations:   . Attends Archivist Meetings:   [name] Marital Status:   Intimate Partner Violence:   . Fear of Current or Ex-Partner:   . Emotionally Abused:   [name] Physically Abused:   . Sexually Abused:     FH:  Family History  Problem Relation Age of Onset  . Glaucoma Brother   . Vascular Disease Father        Died of complications from surgery  . Heart attack Father   . Asthma Mother        Died of asthma attack  . Dementia Mother   . Hyperlipidemia Mother   . Hypertension Mother     Past Medical History:  Diagnosis Date  . Acute respiratory failure with hypoxia and hypercapnea 02/26/2014  . Allergic rhinitis due to pollen   . Allergy   . Arthritis   . Asthma   . CHF (congestive heart failure) ([place])   . COPD (chronic obstructive pulmonary disease) ([place])   . Diastolic dysfunction    a. 02/2014 Echo: EF 65-70%, Gr 1 DD, RVH;  b. 09/2015 Echo: EF 55%, Gr 1 DD.    [name] Emphysema of lung ([place])   . H/O seasonal allergies   . History of chicken pox   . History of tobacco abuse    a. Quit 2015.  [name] History of UTI   . Hypertension   . Hypertensive heart disease   . Lower extremity edema   . Pericardial effusion    a. 09/2015 Echo: Mod eff w/o tamponade.  . Right heart failure  with reduced right ventricular function ([place])    a. 09/2015 Echo: EF 55%, Gr 1 DD, D shaped IV septum, sev dil RV with mod reduced fxn, mod dil RA, RV-RA grad 27mHg, PASP 824mg, mod pericard eff w/o tamponade.  . Severe Pulmonary Hypertension    a. 09/2015 Echo: PASP 8730m.    Current Outpatient Medications  Medication Sig Dispense Refill  . acetaminophen (TYLENOL) 325 MG tablet Take 325 mg by mouth at bedtime as needed for moderate pain or headache.     . albuterol (PROVENTIL HFA;VENTOLIN HFA) 108 (90 Base) MCG/ACT inhaler Inhale 2 puffs every 6 (six) hours as needed into the lungs for wheezing or shortness of breath. 1 Inhaler 11  . ambrisentan (LETAIRIS) 10 MG tablet TAKE 1 TABLET (10MG) BY MOUTH DAILY. DO NOT HANDLE IF PREGNANT. DO NOTSPLIT, CRUSH, OR CHEW. AVOID INHALATION AND CONTACT WITH SKIN OR EYES . CALL [phone] REFILL. 90 tablet 3  . aspirin EC 81 MG EC tablet Take 1 tablet (81 mg total) by mouth daily. 30 tablet 0  . clotrimazole (MYCELEX) 10 MG troche Take 1 tablet (10 mg total) by mouth 5 (five) times daily. 40 tablet 1  . fluticasone (FLONASE) 50 MCG/ACT nasal spray USE 2 SPRAYS IN EAXH NOSTRIL ONCE A DAY 48 mL 5  . furosemide (LASIX) 40 MG tablet TAKE 1 TABLET BY MOUTH TWICE A DAY 180 tablet 1  . ipratropium (ATROVENT) 0.06 % nasal spray Place 2 sprays into both nostrils 2 (two) times daily. 45 mL 3  . OXYGEN 2 to 2lpm 24/7    . Polyethyl Glycol-Propyl Glycol (SYSTANE OP) Apply 1-2 drops to eye daily as needed (dry eyes).    . sMarland Kitchenironolactone (ALDACTONE) 25 MG tablet TAKE 1/2 TABLET BY MOUTH EVERY DAY 45 tablet 3  . tadalafil, PAH, (ADCIRCA) 20 MG tablet  Take 2 tablets (40 mg total) by mouth daily. 180 tablet 3  . TRELEGY ELLIPTA 100-62.5-25 MCG/INH AEPB INHALE 2 PUFFS INTO THE LUNGS EVERY DAY 60 each 5   No current facility-administered medications for this encounter.    Vitals:   12/09/19 1157  BP: (!) 128/44  Pulse: 98  SpO2: 98%  Weight: 62.6 kg (138 lb)  Height: 5' 3.5" (1.613 m)   Wt Readings from Last 3 Encounters:  12/09/19 62.6 kg (138 lb)  05/12/19 61.8 kg (136 lb 3.2 oz)  10/25/18 63 kg (138 lb 12.8 oz)     PHYSICAL EXAM: General:  Well appearing on O2 No resp difficulty HEENT: normal +  telangectacias  Neck: supple. no JVD. Carotids 2+ bilat; no bruits. No lymphadenopathy or thryomegaly appreciated. Cor: PMI nondisplaced. Regular rate & rhythm. No rubs, gallops or murmurs. Lungs: clear with decreased BS throughout Abdomen: soft, nontender, nondistended. No hepatosplenomegaly. No bruits or masses. Good bowel sounds. Extremities: no cyanosis, clubbing, rash, edema Neuro: alert & orientedx3, cranial nerves grossly intact. moves all 4 extremities w/o difficulty. Affect pleasant  ASSESSMENT & PLAN:  1. PAH with right heart failure - suspect combination of WHO Group I and III PAH - Echo 5/18 with complete resolution of RV strain. No significant TR to estimate RVSP - Echo 4/19 with EF 65% no evidence of RV strain  - echo today 12/09/19 EF 70-75% RV normal Personally reviewed - Repeat RHC 12/18 with mild PAH (mean PA 33) - Doing great. Stable NYHA II. Echo with resolution of RV strain. Continue O2, letairis and Adcirca -  Volume status looks good. Continue lasix 40 bid. - Continue  spiro 12.5. - Labs today - Discussed possible eventual need to add selexipag but PA pressures are low currently and RV has normalized  2. Recurrent R pleural effusion - Given improvement in PA pressures doubt effusion related to severe PAH/ Previous ab CT with evidence of cirrhosis.   -  Fluid analysis = transudative.  -  Effusion much  improved on CXR 12/2017 (small)  -  Rheum work-up negative so far. Has seen Dr. Amil Amen and no evidence CTD. 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 Now controlled with with diuresis -  Repeat CXR today  3. Chronic Respiratory failure - Multifactorial. Stable. On 2 liters oxygen - Follows with Dr. Melvyn Novas.   4. COPD- Gold III  - on inhalers. Follows with Pulmonary. As above. No change.  5. Cirrhosis on CT - due to RHF. Hepatitis serologies are negative. Possibly due to right heart failure  [name], MD  12:16 PM

## 2019-12-09 NOTE — Progress Notes (Signed)
  Echocardiogram 2D Echocardiogram has been performed.  Linda Stevens 12/09/2019, 12:05 PM

## 2019-12-09 NOTE — Patient Instructions (Signed)
Labs done today, your results will be available in MyChart, we will contact you for abnormal readings.  A chest x-ray takes a picture of the organs and structures inside the chest, including the heart, lungs, and blood vessels. This test can show several things, including, whether the heart is enlarges; whether fluid is building up in the lungs; and whether pacemaker / defibrillator leads are still in place.  Please call our office in 1 year to schedule a follow up appointment  If you have any questions or concerns before your next appointment please send [place] a message through [place] or call our office at [phone].

## 2019-12-11 ENCOUNTER — Other Ambulatory Visit (HOSPITAL_COMMUNITY): Payer: Self-pay | Admitting: Internal Medicine

## 2019-12-13 ENCOUNTER — Other Ambulatory Visit (HOSPITAL_COMMUNITY): Payer: Medicare HMO

## 2020-01-06 DIAGNOSIS — J449 Chronic obstructive pulmonary disease, unspecified: Secondary | ICD-10-CM | POA: Diagnosis not present

## 2020-01-06 DIAGNOSIS — R062 Wheezing: Secondary | ICD-10-CM | POA: Diagnosis not present

## 2020-01-06 DIAGNOSIS — J45909 Unspecified asthma, uncomplicated: Secondary | ICD-10-CM | POA: Diagnosis not present

## 2020-01-06 DIAGNOSIS — J9601 Acute respiratory failure with hypoxia: Secondary | ICD-10-CM | POA: Diagnosis not present

## 2020-01-24 ENCOUNTER — Other Ambulatory Visit (HOSPITAL_COMMUNITY): Payer: Self-pay | Admitting: Internal Medicine

## 2020-02-06 DIAGNOSIS — J449 Chronic obstructive pulmonary disease, unspecified: Secondary | ICD-10-CM | POA: Diagnosis not present

## 2020-02-06 DIAGNOSIS — J45909 Unspecified asthma, uncomplicated: Secondary | ICD-10-CM | POA: Diagnosis not present

## 2020-02-06 DIAGNOSIS — J9601 Acute respiratory failure with hypoxia: Secondary | ICD-10-CM | POA: Diagnosis not present

## 2020-02-06 DIAGNOSIS — R062 Wheezing: Secondary | ICD-10-CM | POA: Diagnosis not present

## 2020-02-13 ENCOUNTER — Ambulatory Visit: Payer: Medicare HMO | Admitting: Internal Medicine

## 2020-02-13 ENCOUNTER — Other Ambulatory Visit: Payer: Self-pay

## 2020-02-13 ENCOUNTER — Encounter: Payer: Self-pay | Admitting: Internal Medicine

## 2020-02-13 DIAGNOSIS — J9612 Chronic respiratory failure with hypercapnia: Secondary | ICD-10-CM | POA: Diagnosis not present

## 2020-02-13 DIAGNOSIS — J9611 Chronic respiratory failure with hypoxia: Secondary | ICD-10-CM

## 2020-02-13 DIAGNOSIS — J449 Chronic obstructive pulmonary disease, unspecified: Secondary | ICD-10-CM | POA: Diagnosis not present

## 2020-02-13 MED ORDER — CLOTRIMAZOLE 10 MG MT TROC: 10.0000 mg | Freq: Every day | OROMUCOSAL | 1 refills | Status: DC

## 2020-02-13 MED ORDER — AZITHROMYCIN 250 MG PO TABS: ORAL_TABLET | ORAL | 0 refills | Status: DC

## 2020-02-13 MED ORDER — PREDNISONE 10 MG PO TABS: ORAL_TABLET | ORAL | 0 refills | Status: DC

## 2020-02-13 NOTE — Patient Instructions (Addendum)
Prednisone 10 mg take  4 each am x 2 days,   2 each am x 2 days,  1 each am x 2 days and stop   Blow trelegy out thru nose   No change in maintenance medications  Make sure you check your oxygen saturations at highest level of activity to be sure it stays over 90% and adjust upward to maintain this level if needed but remember to turn it back to previous settings when you stop (to conserve your supply).   zpak   Please schedule a follow up visit in 6  months but call sooner if needed

## 2020-02-13 NOTE — Progress Notes (Signed)
Subjective:   Patient ID: Linda Stevens, female    DOB: May 08, 1946  MRN: [id number]    Brief patient profile:  35  yowf quit smoking 02/22/14  with  GOLD II/III  copd/ cor pulmonale vs HP syndrome related to cirrhosis     Procedures: 1/24/17ECHO : EF 70%, grade 1 diastolic dysfx, severe RV dilation, PAS 87 mmHg, mod pericardial effusion 10/04/15  Doppler legs >> negative 10/04/15  V/Q scan >> low probability for PE 10/08/15 Rt/Lt heart cath >> RA 9, RV 97/8/12, PA 104/49/72, PCW 18, CI 2.34, PVR 13.6 10/05/15  PFT >> FEV1 0.91 (40%), FEV1% 56, TLC 4.32 (88%), DLCO 38%, no BD    11/06/2015  Transition of care f/u ov/Courtnee Myer re: copd gold II/III/ chronic resp failure/ ? Cor pulmonale vs hepatopulmonary syndrome?  Chief Complaint  Patient presents with  . Follow-up    Pt states overall her breathing is doing well. She is currrently taking Dulera, but ins prefers advair.   doe = MMRC2 = can't walk a nl pace on a flat grade s sob even on 02 1.5 lpm sometimes increases to 2 Very confused with details of care / leg swelling resolved  rec Try symbicort 160 Take 2 puffs first thing in am and then another 2 puffs about 12 hours later.  Continue spiriva for now pending follow up pfts Wear the oxygen as much as you can at 1.5 lpm  Please schedule a follow up office visit in 4 weeks, ok to do wlh PFTs same day    12/06/2015  f/u ov/Lowell Mcgurk re: GOLD II/III / spiriva/symbiocrt maint  Chief Complaint  Patient presents with  . Follow-up    PFT done at [facility]. Pt c/o min cough with clear sputum- relates to allergies.   no change doe = MMRC2 = can't walk a nl pace on a flat grade s sob  Even on 02  Main new problem is nasal congestion/ neg resp to flonase/ irritated by 02 but can't use humidifier at low flow rate  rec Change symbicort to advair 115 Take 2 puffs first thing in am and then another 2 puffs about 12 hours later. Work on inhaler technique:  Prednisone 10 mg take  4 each am x 2 days,   2  each am x 2 days,  1 each am x 2 days and stop  I emphasized that nasal steroids have no immediate benefit in terms   Ok to take clariton or allegra as needed for allergies Consult Dr [place] Bells next available re pulmonary hypertension          07/07/2017 acute extended ov/Javaughn Opdahl re: COPD  GOLD II/III/ Incruse/advair (unable to name them but sure that's what they are Chief Complaint  Patient presents with  . Follow-up    Pt not feeling well, not breathing well since this past weekend. Pt has lower back pain moving to the sides affecting her breathing, pt has O2 turned up to 3 liters instead of 2 normally.   some raspy coughing first thing in am prod just white mucus  x around a week prior to OV   Last week can't really lie down due to back pain but not pleuritic / assoc with urgency  Has not tried even tylenol for back pain but heat seems to help. Denies trauma Doe minimally worse, better on higher 02 as above  rec   Doxycycline 100 mg twice daily x 10 days  Please remember to go to the lab and  x-ray department: dx new R effusion   thoracentesis  07/15/2017   X 580 cc with wbc's 1974 with L >> P .  Prot 3.5 , nl glucose   Alb 1.9  cyt neg with predominance of T lymphocytes         07/17/2017  f/u ov/Trinisha Paget re:  COPD II/ III and 02 2.5 increases to 4-5 with ex / new R effusion/ exudate  Chief Complaint  Patient presents with  . Follow-up    Breathing is much improved. She is coughing less sputum is minimal and clear.   sleeps at 45 degrees on 2.5 lpm and able to lie flatter since tap  Overall back near baseline p tap rec Plan A = Automatic = Trelegy one click x two good drags  Stop incruse and advair  Plan B = Backup Only use your albuterol (Proair=Red/ proventil=yellow) as a rescue medication to be used if you can't catch your breath   Please schedule a follow up office visit in 4 weeks, sooner if needed  - late add:  CT chest with contrast needed due to persistent atx on R base>  referred to T surgery > declined intervention, improved with diuresis      04/26/2018  f/u ov/Torrie Namba re:  COPD II/ III/ maint trelegy  and 02 on 2.5-3 up to 5lpm when  on treadmill  [employer] 2nd shot March 1st 2021 Chief Complaint  Patient presents with  . Follow-up    SOB with activity and productive cough mucus clear to white.  Dyspnea:  treadmill x 510 steps x 20 min x slt incline tendency is better Cough: minimal white mucus mostly in ams Sleeping: slt angle < 30 degrees  SABA use: not using much  02: as above   rec Ok to try proaire 15 min before you exercise  Work on inhaler technique: Clotrimazole 10 mg troche up to 4 x daily as needed  Please schedule a follow up visit in 3 months but call sooner if needed - bring your drug formulary with you if there are any issues related to your medications so we can help you choose the least expensive.          02/13/2020  f/u ov/Hollynn Garno re:  COPD  II spirometry / 02  Chief Complaint  Patient presents with  . Follow-up    Increased SOB since  02/09/20 Increased fatigue. Body aches.   Dyspnea:  Worse x 3-4 days worse in humid conditions Cough: none  Sleeping: flat  Bed / wedge pillows to 30 degrees  SABA use: up to twice daily x several days  02: walking up 4-5 lpm while on treadmill at slow speed x 20  min s stopping then back to around 3lpm the rest of the day     No obvious day to day or daytime variability or assoc excess/ purulent sputum or mucus plugs or hemoptysis or cp or chest tightness, subjective wheeze or overt sinus or hb symptoms.   Sleeping as above  without nocturnal  or early am exacerbation  of respiratory  c/o's or need for noct saba. Also denies any obvious fluctuation of symptoms with weather or environmental changes or other aggravating or alleviating factors except as outlined above   No unusual exposure hx or h/o childhood pna/ asthma or knowledge of premature birth.  Current Allergies, Complete Past Medical History,  Past Surgical History, Family History, and Social History were reviewed in [employer] record.  ROS  The  following are not active complaints unless bolded Hoarseness, sore throat, dysphagia, dental problems, itching, sneezing,  nasal congestion or discharge of excess mucus or purulent secretions, ear ache,   fever, chills, sweats, unintended wt loss or wt gain, classically pleuritic or exertional cp,  orthopnea pnd or arm/hand swelling  or leg swelling, presyncope, palpitations, abdominal pain, anorexia, nausea, vomiting, diarrhea  or change in bowel habits or change in bladder habits, change in stools or change in urine, dysuria, hematuria,  rash, arthralgias, visual complaints, headache, numbness, weakness or ataxia or problems with walking or coordination,  change in mood or  memory.        Current Meds  Medication Sig  . acetaminophen (TYLENOL) 325 MG tablet Take 325 mg by mouth at bedtime as needed for moderate pain or headache.   . albuterol (PROVENTIL HFA;VENTOLIN HFA) 108 (90 Base) MCG/ACT inhaler Inhale 2 puffs every 6 (six) hours as needed into the lungs for wheezing or shortness of breath.  [name] ambrisentan (LETAIRIS) 10 MG tablet TAKE 1 TABLET (10MG) BY MOUTH DAILY. DO NOT HANDLE IF PREGNANT. DO NOTSPLIT, CRUSH, OR CHEW. AVOID INHALATION AND CONTACT WITH SKIN OR EYES . CALL [phone] TO REFILL.  [name] aspirin EC 81 MG EC tablet Take 1 tablet (81 mg total) by mouth daily.  . clotrimazole (MYCELEX) 10 MG troche Take 1 tablet (10 mg total) by mouth 5 (five) times daily.  . fluticasone (FLONASE) 50 MCG/ACT nasal spray USE 2 SPRAYS IN EAXH NOSTRIL ONCE A DAY  . furosemide (LASIX) 40 MG tablet TAKE 1 TABLET BY MOUTH TWICE A DAY  . ipratropium (ATROVENT) 0.06 % nasal spray PLACE 2 SPRAYS INTO BOTH NOSTRILS 2 (TWO) TIMES DAILY  . OXYGEN 2 to 2lpm 24/7  . Polyethyl Glycol-Propyl Glycol (SYSTANE OP) Apply 1-2 drops to eye daily as needed (dry eyes).  [name] spironolactone  (ALDACTONE) 25 MG tablet TAKE 1/2 TABLET BY MOUTH EVERY DAY  . tadalafil, PAH, (ADCIRCA) 20 MG tablet Take 2 tablets (40 mg total) by mouth daily.  . TRELEGY ELLIPTA 100-62.5-25 MCG/INH AEPB INHALE 2 PUFFS INTO THE LUNGS EVERY DAY                Objective:   Physical Exam  wc bound variably hoarse wf nad   02/13/2020      135 04/06/2014        154 > 12/06/2015  148  > 08/28/2016   145 > 07/07/2017  142>07/17/2017  141> 04/26/2018   140 > 08/16/2018  131    03/09/14 147 lb (66.679 kg)  02/22/14 150 lb 3.2 oz (68.13 kg)  02/22/14 147 lb 8 oz (66.906 kg)    Vital signs reviewed  02/13/2020  - Note at rest 02 sats  95% on 3lpm       HEENT : pt wearing mask not removed for exam due to covid - 19 concerns.    NECK :  without JVD/Nodes/TM/ nl carotid upstrokes bilaterally   LUNGS: no acc muscle use,  Mild barrel  contour chest wall with bilateral  Distant bs  With minimal insp/exp rhonchi  and  without cough on insp or exp maneuvers  and mild  Hyperresonant  to  percussion bilaterally     CV:  RRR  no s3 or murmur or increase in P2, and no edema   ABD:  soft and nontender with pos end  insp Hoover's  in the supine position. No bruits or organomegaly appreciated, bowel sounds nl  MS:  Nl gait/  ext warm without deformities, calf tenderness, cyanosis or clubbing No obvious joint restrictions   SKIN: warm and dry without lesions    NEURO:  alert, approp, nl sensorium with  no motor or cerebellar deficits apparent.            Assessment & Plan:

## 2020-02-14 ENCOUNTER — Telehealth: Payer: Self-pay | Admitting: Family Medicine

## 2020-02-14 DIAGNOSIS — I10 Essential (primary) hypertension: Secondary | ICD-10-CM

## 2020-02-14 NOTE — Telephone Encounter (Signed)
-----  Message from Ellamae Sia sent at 02/01/2020 11:47 AM EDT ----- Regarding: Lab orders for Wednesday, 6.9.21 Patient is scheduled for CPX labs, please order future labs, Thanks , Karna Christmas

## 2020-02-15 ENCOUNTER — Other Ambulatory Visit: Payer: Medicare HMO

## 2020-02-15 ENCOUNTER — Ambulatory Visit: Payer: Medicare HMO

## 2020-02-15 ENCOUNTER — Encounter: Payer: Self-pay | Admitting: Internal Medicine

## 2020-02-15 NOTE — Assessment & Plan Note (Signed)
Quit smoking 02/2014 - PFT's  04/06/14   FEV1 1.36 (57 % ) ratio 63  p 9 % improvement from saba with DLCO  51 % corrects to 71 % for alv volume   - PFT's  10/08/2015  FEV1 0.91 (40 % ) ratio 56  p no % improvement from saba with DLCO 38 % corrects to 63 % for alv volume   - PFT's  12/06/2015  FEV1 1.17  (52 % ) ratio 62  p 16 % improvement from saba p no rx prior to study with DLCO  28 % corrects to 44 % for alv volume   - 07/17/2017   try change to trelegy > improved  - 08/16/2018  After extensive coaching inhaler device,  effectiveness =    75% (trigger early)    Group D in terms of symptom/risk and laba/lama/ICS  therefore appropriate rx at this point >>>  trelegy and ? If having a mild aecopd > Prednisone 10 mg take  4 each am x 2 days,   2 each am x 2 days,  1 each am x 2 days and stop / zpak and f/u if not back to baseline          Each maintenance medication was reviewed in detail including emphasizing most importantly the difference between maintenance and prns and under what circumstances the prns are to be triggered using an action plan format where appropriate.  Total time for H and P, chart review, counseling, teaching device and generating customized AVS unique to this office visit / charting = 20 min

## 2020-02-15 NOTE — Assessment & Plan Note (Signed)
Discharged on 02  10/09/15 p admit  - HCO3  On 01/26/17  = 32  - HC03   On 07/20/2017   = 34  - HC03   On 09/26/17         = 34 - HC03   On 07/19/18       = 37   As of 02/13/2020  2.5 lpm sleep and at rest and adjust with ex to keep > 90%      Adequate control on present rx, reviewed in detail with pt > no change in rx needed

## 2020-02-19 ENCOUNTER — Inpatient Hospital Stay (HOSPITAL_COMMUNITY): Payer: Medicare HMO

## 2020-02-19 ENCOUNTER — Inpatient Hospital Stay (HOSPITAL_COMMUNITY)
Admission: EM | Admit: 2020-02-19 | Discharge: 2020-03-10 | DRG: 193 | Disposition: A | Discharge status: Skilled Nursing Facility | Payer: Medicare HMO | Attending: Internal Medicine | Admitting: Internal Medicine

## 2020-02-19 ENCOUNTER — Encounter (HOSPITAL_COMMUNITY): Payer: Self-pay | Admitting: Radiology

## 2020-02-19 ENCOUNTER — Emergency Department (HOSPITAL_COMMUNITY): Payer: Medicare HMO

## 2020-02-19 ENCOUNTER — Other Ambulatory Visit: Payer: Self-pay

## 2020-02-19 DIAGNOSIS — D500 Iron deficiency anemia secondary to blood loss (chronic): Secondary | ICD-10-CM | POA: Diagnosis not present

## 2020-02-19 DIAGNOSIS — J301 Allergic rhinitis due to pollen: Secondary | ICD-10-CM | POA: Diagnosis present

## 2020-02-19 DIAGNOSIS — B957 Other staphylococcus as the cause of diseases classified elsewhere: Secondary | ICD-10-CM | POA: Diagnosis present

## 2020-02-19 DIAGNOSIS — J45909 Unspecified asthma, uncomplicated: Secondary | ICD-10-CM | POA: Diagnosis not present

## 2020-02-19 DIAGNOSIS — Z9981 Dependence on supplemental oxygen: Secondary | ICD-10-CM

## 2020-02-19 DIAGNOSIS — K7460 Unspecified cirrhosis of liver: Secondary | ICD-10-CM | POA: Diagnosis present

## 2020-02-19 DIAGNOSIS — I4891 Unspecified atrial fibrillation: Secondary | ICD-10-CM | POA: Diagnosis not present

## 2020-02-19 DIAGNOSIS — I5033 Acute on chronic diastolic (congestive) heart failure: Secondary | ICD-10-CM | POA: Diagnosis not present

## 2020-02-19 DIAGNOSIS — I517 Cardiomegaly: Secondary | ICD-10-CM | POA: Diagnosis not present

## 2020-02-19 DIAGNOSIS — R7881 Bacteremia: Secondary | ICD-10-CM | POA: Diagnosis not present

## 2020-02-19 DIAGNOSIS — Z881 Allergy status to other antibiotic agents status: Secondary | ICD-10-CM

## 2020-02-19 DIAGNOSIS — Z87891 Personal history of nicotine dependence: Secondary | ICD-10-CM

## 2020-02-19 DIAGNOSIS — Z20822 Contact with and (suspected) exposure to covid-19: Secondary | ICD-10-CM | POA: Diagnosis not present

## 2020-02-19 DIAGNOSIS — Z66 Do not resuscitate: Secondary | ICD-10-CM | POA: Diagnosis not present

## 2020-02-19 DIAGNOSIS — D696 Thrombocytopenia, unspecified: Secondary | ICD-10-CM | POA: Diagnosis present

## 2020-02-19 DIAGNOSIS — J9621 Acute and chronic respiratory failure with hypoxia: Secondary | ICD-10-CM | POA: Diagnosis present

## 2020-02-19 DIAGNOSIS — J302 Other seasonal allergic rhinitis: Secondary | ICD-10-CM | POA: Diagnosis present

## 2020-02-19 DIAGNOSIS — Z888 Allergy status to other drugs, medicaments and biological substances status: Secondary | ICD-10-CM

## 2020-02-19 DIAGNOSIS — E876 Hypokalemia: Secondary | ICD-10-CM | POA: Diagnosis present

## 2020-02-19 DIAGNOSIS — I2721 Secondary pulmonary arterial hypertension: Secondary | ICD-10-CM | POA: Diagnosis not present

## 2020-02-19 DIAGNOSIS — J90 Pleural effusion, not elsewhere classified: Secondary | ICD-10-CM | POA: Diagnosis not present

## 2020-02-19 DIAGNOSIS — Z9049 Acquired absence of other specified parts of digestive tract: Secondary | ICD-10-CM

## 2020-02-19 DIAGNOSIS — I130 Hypertensive heart and chronic kidney disease with heart failure and stage 1 through stage 4 chronic kidney disease, or unspecified chronic kidney disease: Secondary | ICD-10-CM | POA: Diagnosis not present

## 2020-02-19 DIAGNOSIS — Z7982 Long term (current) use of aspirin: Secondary | ICD-10-CM

## 2020-02-19 DIAGNOSIS — J440 Chronic obstructive pulmonary disease with acute lower respiratory infection: Secondary | ICD-10-CM | POA: Diagnosis present

## 2020-02-19 DIAGNOSIS — Z515 Encounter for palliative care: Secondary | ICD-10-CM

## 2020-02-19 DIAGNOSIS — I700 Atherosclerosis of aorta: Secondary | ICD-10-CM | POA: Diagnosis present

## 2020-02-19 DIAGNOSIS — R0902 Hypoxemia: Secondary | ICD-10-CM | POA: Diagnosis not present

## 2020-02-19 DIAGNOSIS — J9622 Acute and chronic respiratory failure with hypercapnia: Secondary | ICD-10-CM | POA: Diagnosis not present

## 2020-02-19 DIAGNOSIS — I272 Pulmonary hypertension, unspecified: Secondary | ICD-10-CM | POA: Diagnosis not present

## 2020-02-19 DIAGNOSIS — I2510 Atherosclerotic heart disease of native coronary artery without angina pectoris: Secondary | ICD-10-CM | POA: Diagnosis present

## 2020-02-19 DIAGNOSIS — R5381 Other malaise: Secondary | ICD-10-CM | POA: Diagnosis present

## 2020-02-19 DIAGNOSIS — R062 Wheezing: Secondary | ICD-10-CM | POA: Diagnosis not present

## 2020-02-19 DIAGNOSIS — J189 Pneumonia, unspecified organism: Secondary | ICD-10-CM | POA: Diagnosis not present

## 2020-02-19 DIAGNOSIS — Z8249 Family history of ischemic heart disease and other diseases of the circulatory system: Secondary | ICD-10-CM

## 2020-02-19 DIAGNOSIS — J9601 Acute respiratory failure with hypoxia: Secondary | ICD-10-CM | POA: Diagnosis not present

## 2020-02-19 DIAGNOSIS — R748 Abnormal levels of other serum enzymes: Secondary | ICD-10-CM | POA: Diagnosis not present

## 2020-02-19 DIAGNOSIS — I313 Pericardial effusion (noninflammatory): Secondary | ICD-10-CM | POA: Diagnosis not present

## 2020-02-19 DIAGNOSIS — Z7951 Long term (current) use of inhaled steroids: Secondary | ICD-10-CM

## 2020-02-19 DIAGNOSIS — D638 Anemia in other chronic diseases classified elsewhere: Secondary | ICD-10-CM | POA: Diagnosis present

## 2020-02-19 DIAGNOSIS — Z7189 Other specified counseling: Secondary | ICD-10-CM

## 2020-02-19 DIAGNOSIS — N1831 Chronic kidney disease, stage 3a: Secondary | ICD-10-CM | POA: Diagnosis present

## 2020-02-19 DIAGNOSIS — Z7401 Bed confinement status: Secondary | ICD-10-CM | POA: Diagnosis not present

## 2020-02-19 DIAGNOSIS — Z79899 Other long term (current) drug therapy: Secondary | ICD-10-CM

## 2020-02-19 DIAGNOSIS — E1165 Type 2 diabetes mellitus with hyperglycemia: Secondary | ICD-10-CM | POA: Diagnosis not present

## 2020-02-19 DIAGNOSIS — E785 Hyperlipidemia, unspecified: Secondary | ICD-10-CM | POA: Diagnosis present

## 2020-02-19 DIAGNOSIS — I499 Cardiac arrhythmia, unspecified: Secondary | ICD-10-CM | POA: Diagnosis not present

## 2020-02-19 DIAGNOSIS — I5032 Chronic diastolic (congestive) heart failure: Secondary | ICD-10-CM | POA: Diagnosis not present

## 2020-02-19 DIAGNOSIS — I5082 Biventricular heart failure: Secondary | ICD-10-CM | POA: Diagnosis present

## 2020-02-19 DIAGNOSIS — R109 Unspecified abdominal pain: Secondary | ICD-10-CM | POA: Diagnosis not present

## 2020-02-19 DIAGNOSIS — I959 Hypotension, unspecified: Secondary | ICD-10-CM | POA: Diagnosis present

## 2020-02-19 DIAGNOSIS — I3139 Other pericardial effusion (noninflammatory): Secondary | ICD-10-CM

## 2020-02-19 DIAGNOSIS — N179 Acute kidney failure, unspecified: Secondary | ICD-10-CM | POA: Diagnosis present

## 2020-02-19 DIAGNOSIS — I509 Heart failure, unspecified: Secondary | ICD-10-CM

## 2020-02-19 DIAGNOSIS — Z83438 Family history of other disorder of lipoprotein metabolism and other lipidemia: Secondary | ICD-10-CM

## 2020-02-19 DIAGNOSIS — Z90710 Acquired absence of both cervix and uterus: Secondary | ICD-10-CM

## 2020-02-19 DIAGNOSIS — G9341 Metabolic encephalopathy: Secondary | ICD-10-CM | POA: Diagnosis present

## 2020-02-19 DIAGNOSIS — M2550 Pain in unspecified joint: Secondary | ICD-10-CM | POA: Diagnosis not present

## 2020-02-19 DIAGNOSIS — J449 Chronic obstructive pulmonary disease, unspecified: Secondary | ICD-10-CM | POA: Diagnosis not present

## 2020-02-19 DIAGNOSIS — R0602 Shortness of breath: Secondary | ICD-10-CM | POA: Diagnosis not present

## 2020-02-19 DIAGNOSIS — I480 Paroxysmal atrial fibrillation: Secondary | ICD-10-CM | POA: Diagnosis present

## 2020-02-19 DIAGNOSIS — K7469 Other cirrhosis of liver: Secondary | ICD-10-CM | POA: Diagnosis not present

## 2020-02-19 DIAGNOSIS — J9811 Atelectasis: Secondary | ICD-10-CM | POA: Diagnosis not present

## 2020-02-19 LAB — CBC WITH DIFFERENTIAL/PLATELET
Abs Immature Granulocytes: 0.11 10*3/uL — ABNORMAL HIGH (ref 0.00–0.07)
Basophils Absolute: 0 10*3/uL (ref 0.0–0.1)
Basophils Relative: 0 %
Eosinophils Absolute: 0 10*3/uL (ref 0.0–0.5)
Eosinophils Relative: 0 %
HCT: 38.5 % (ref 36.0–46.0)
Hemoglobin: 12 g/dL (ref 12.0–15.0)
Immature Granulocytes: 1 %
Lymphocytes Relative: 5 %
Lymphs Abs: 0.6 10*3/uL — ABNORMAL LOW (ref 0.7–4.0)
MCH: 31.5 pg (ref 26.0–34.0)
MCHC: 31.2 g/dL (ref 30.0–36.0)
MCV: 101 fL — ABNORMAL HIGH (ref 80.0–100.0)
Monocytes Absolute: 0.8 10*3/uL (ref 0.1–1.0)
Monocytes Relative: 7 %
Neutro Abs: 9.8 10*3/uL — ABNORMAL HIGH (ref 1.7–7.7)
Neutrophils Relative %: 87 %
Platelets: 309 10*3/uL (ref 150–400)
RBC: 3.81 MIL/uL — ABNORMAL LOW (ref 3.87–5.11)
RDW: 16 % — ABNORMAL HIGH (ref 11.5–15.5)
WBC: 11.4 10*3/uL — ABNORMAL HIGH (ref 4.0–10.5)
nRBC: 0 % (ref 0.0–0.2)

## 2020-02-19 LAB — BRAIN NATRIURETIC PEPTIDE: B Natriuretic Peptide: 164 pg/mL — ABNORMAL HIGH (ref 0.0–100.0)

## 2020-02-19 LAB — COMPREHENSIVE METABOLIC PANEL
ALT: 34 U/L (ref 0–44)
AST: 90 U/L — ABNORMAL HIGH (ref 15–41)
Albumin: 3.1 g/dL — ABNORMAL LOW (ref 3.5–5.0)
Alkaline Phosphatase: 130 U/L — ABNORMAL HIGH (ref 38–126)
Anion gap: 16 — ABNORMAL HIGH (ref 5–15)
BUN: 13 mg/dL (ref 8–23)
CO2: 30 mmol/L (ref 22–32)
Calcium: 7.9 mg/dL — ABNORMAL LOW (ref 8.9–10.3)
Chloride: 92 mmol/L — ABNORMAL LOW (ref 98–111)
Creatinine, Ser: 1.01 mg/dL — ABNORMAL HIGH (ref 0.44–1.00)
GFR calc Af Amer: >60 mL/min (ref >60)
GFR calc non Af Amer: 56 mL/min — ABNORMAL LOW (ref >60)
Glucose, Bld: 205 mg/dL — ABNORMAL HIGH (ref 70–99)
Potassium: 4.3 mmol/L (ref 3.5–5.1)
Sodium: 138 mmol/L (ref 135–145)
Total Bilirubin: 1.9 mg/dL — ABNORMAL HIGH (ref 0.3–1.2)
Total Protein: 6.1 g/dL — ABNORMAL LOW (ref 6.5–8.1)

## 2020-02-19 LAB — MAGNESIUM: Magnesium: 1.9 mg/dL (ref 1.7–2.4)

## 2020-02-19 LAB — MRSA PCR SCREENING: MRSA by PCR: NEGATIVE

## 2020-02-19 LAB — LACTIC ACID, PLASMA
Lactic Acid, Venous: 2.8 mmol/L (ref 0.5–1.9)
Lactic Acid, Venous: 3.4 mmol/L (ref 0.5–1.9)

## 2020-02-19 LAB — TSH: TSH: 2.577 u[IU]/mL (ref 0.350–4.500)

## 2020-02-19 LAB — ECHOCARDIOGRAM COMPLETE
Height: 63 in
Weight: 2160 oz

## 2020-02-19 LAB — SARS CORONAVIRUS 2 BY RT PCR (HOSPITAL ORDER, PERFORMED IN ~~LOC~~ HOSPITAL LAB): SARS Coronavirus 2: NEGATIVE

## 2020-02-19 LAB — TROPONIN I (HIGH SENSITIVITY)
Troponin I (High Sensitivity): 4 ng/L (ref <18)
Troponin I (High Sensitivity): 10 ng/L (ref <18)

## 2020-02-19 MED ORDER — RISAQUAD PO CAPS
1.0000 | ORAL_CAPSULE | Freq: Every day | ORAL | Status: DC
  Administered 2020-02-20 – 2020-03-10 (×20): 1 via ORAL
  Filled 2020-02-19 (×21): qty 1

## 2020-02-19 MED ORDER — ENOXAPARIN SODIUM 40 MG/0.4ML ~~LOC~~ SOLN
40.0000 mg | SUBCUTANEOUS | Status: DC
  Administered 2020-02-19 – 2020-02-23 (×5): 40 mg via SUBCUTANEOUS
  Filled 2020-02-19 (×5): qty 0.4

## 2020-02-19 MED ORDER — ONDANSETRON HCL 4 MG/2ML IJ SOLN
4.0000 mg | Freq: Once | INTRAMUSCULAR | Status: AC
  Administered 2020-02-19: 4 mg via INTRAVENOUS
  Filled 2020-02-19: qty 2

## 2020-02-19 MED ORDER — SODIUM CHLORIDE 0.9 % IV SOLN
500.0000 mg | INTRAVENOUS | Status: DC
  Filled 2020-02-19: qty 500

## 2020-02-19 MED ORDER — ONDANSETRON HCL 4 MG PO TABS: 4.0000 mg | ORAL_TABLET | Freq: Four times a day (QID) | ORAL | Status: DC | PRN

## 2020-02-19 MED ORDER — SODIUM CHLORIDE 0.9 % IV SOLN
2.0000 g | INTRAVENOUS | Status: DC
  Filled 2020-02-19: qty 20

## 2020-02-19 MED ORDER — FLUTICASONE-UMECLIDIN-VILANT 100-62.5-25 MCG/INH IN AEPB: 2.0000 | INHALATION_SPRAY | Freq: Every day | RESPIRATORY_TRACT | Status: DC

## 2020-02-19 MED ORDER — SODIUM CHLORIDE 0.9 % IV SOLN
1.0000 g | Freq: Once | INTRAVENOUS | Status: AC
  Administered 2020-02-19: 1 g via INTRAVENOUS
  Filled 2020-02-19: qty 10

## 2020-02-19 MED ORDER — ONDANSETRON HCL 4 MG/2ML IJ SOLN
4.0000 mg | Freq: Four times a day (QID) | INTRAMUSCULAR | Status: DC | PRN
  Administered 2020-02-19 – 2020-02-23 (×8): 4 mg via INTRAVENOUS
  Filled 2020-02-19 (×9): qty 2

## 2020-02-19 MED ORDER — MORPHINE SULFATE (PF) 2 MG/ML IV SOLN
2.0000 mg | INTRAVENOUS | Status: DC | PRN
  Administered 2020-02-19 – 2020-02-23 (×5): 2 mg via INTRAVENOUS
  Filled 2020-02-19 (×5): qty 1

## 2020-02-19 MED ORDER — AMBRISENTAN 10 MG PO TABS
10.0000 mg | ORAL_TABLET | Freq: Every day | ORAL | Status: DC
  Administered 2020-02-19 – 2020-02-21 (×3): 10 mg via ORAL
  Administered 2020-02-22: 5 mg via ORAL
  Administered 2020-02-23 – 2020-03-08 (×15): 10 mg via ORAL
  Filled 2020-02-19 (×7): qty 2
  Filled 2020-02-19 (×2): qty 1
  Filled 2020-02-19 (×12): qty 2

## 2020-02-19 MED ORDER — UMECLIDINIUM BROMIDE 62.5 MCG/INH IN AEPB
2.0000 | INHALATION_SPRAY | Freq: Every day | RESPIRATORY_TRACT | Status: DC
  Administered 2020-02-20 – 2020-03-10 (×17): 2 via RESPIRATORY_TRACT
  Filled 2020-02-19 (×4): qty 7

## 2020-02-19 MED ORDER — METHYLPREDNISOLONE SODIUM SUCC 125 MG IJ SOLR
125.0000 mg | Freq: Once | INTRAMUSCULAR | Status: AC
  Administered 2020-02-19: 125 mg via INTRAVENOUS
  Filled 2020-02-19: qty 2

## 2020-02-19 MED ORDER — FLUTICASONE PROPIONATE 50 MCG/ACT NA SUSP
2.0000 | Freq: Every day | NASAL | Status: DC
  Administered 2020-02-20 – 2020-03-09 (×17): 2 via NASAL
  Filled 2020-02-19: qty 16

## 2020-02-19 MED ORDER — METHYLPREDNISOLONE SODIUM SUCC 125 MG IJ SOLR
60.0000 mg | Freq: Three times a day (TID) | INTRAMUSCULAR | Status: DC
  Administered 2020-02-19 – 2020-02-20 (×2): 60 mg via INTRAVENOUS
  Filled 2020-02-19 (×2): qty 2

## 2020-02-19 MED ORDER — ALBUTEROL SULFATE HFA 108 (90 BASE) MCG/ACT IN AERS
4.0000 | INHALATION_SPRAY | Freq: Once | RESPIRATORY_TRACT | Status: AC
  Administered 2020-02-19: 4 via RESPIRATORY_TRACT
  Filled 2020-02-19: qty 6.7

## 2020-02-19 MED ORDER — TADALAFIL (PAH) 20 MG PO TABS
40.0000 mg | ORAL_TABLET | Freq: Every day | ORAL | Status: DC
  Administered 2020-02-19 – 2020-03-10 (×21): 40 mg via ORAL
  Filled 2020-02-19 (×21): qty 2

## 2020-02-19 MED ORDER — FLUTICASONE FUROATE-VILANTEROL 100-25 MCG/INH IN AEPB
2.0000 | INHALATION_SPRAY | Freq: Every day | RESPIRATORY_TRACT | Status: DC
  Administered 2020-02-20 – 2020-03-10 (×17): 2 via RESPIRATORY_TRACT
  Filled 2020-02-19 (×3): qty 28

## 2020-02-19 MED ORDER — ACETAMINOPHEN 325 MG PO TABS: 650.0000 mg | ORAL_TABLET | Freq: Four times a day (QID) | ORAL | Status: DC | PRN

## 2020-02-19 MED ORDER — SODIUM CHLORIDE 0.9 % IV SOLN
500.0000 mg | Freq: Once | INTRAVENOUS | Status: AC
  Administered 2020-02-19: 500 mg via INTRAVENOUS
  Filled 2020-02-19: qty 500

## 2020-02-19 MED ORDER — IOHEXOL 350 MG/ML SOLN
80.0000 mL | Freq: Once | INTRAVENOUS | Status: AC | PRN
  Administered 2020-02-19: 80 mL via INTRAVENOUS

## 2020-02-19 MED ORDER — ASPIRIN EC 81 MG PO TBEC
81.0000 mg | DELAYED_RELEASE_TABLET | Freq: Every day | ORAL | Status: DC
  Administered 2020-02-19 – 2020-03-10 (×21): 81 mg via ORAL
  Filled 2020-02-19 (×21): qty 1

## 2020-02-19 MED ORDER — MORPHINE SULFATE (PF) 4 MG/ML IV SOLN
4.0000 mg | Freq: Once | INTRAVENOUS | Status: AC
  Administered 2020-02-19: 4 mg via INTRAVENOUS
  Filled 2020-02-19: qty 1

## 2020-02-19 MED ORDER — GUAIFENESIN ER 600 MG PO TB12
600.0000 mg | ORAL_TABLET | Freq: Two times a day (BID) | ORAL | Status: DC
  Administered 2020-02-19 – 2020-03-10 (×40): 600 mg via ORAL
  Filled 2020-02-19 (×41): qty 1

## 2020-02-19 MED ORDER — ACETAMINOPHEN 650 MG RE SUPP
650.0000 mg | Freq: Four times a day (QID) | RECTAL | Status: DC | PRN
  Filled 2020-02-19: qty 1

## 2020-02-19 NOTE — ED Notes (Signed)
Patient transported to CT

## 2020-02-19 NOTE — ED Triage Notes (Addendum)
Pt. Arrived via [facility] from home c/o SOB, chest pain, and intermittent nausea and vomiting. HR 222 on EMS arrival HX. COPD, HF

## 2020-02-19 NOTE — ED Provider Notes (Signed)
[facility] EMERGENCY DEPARTMENT Provider Note   CSN: [id number] Arrival date & time: 02/19/20  0344     History Chief Complaint  Patient presents with  . Shortness of Breath  . Tachycardia    Linda Stevens is a 74 y.o. female.  Patient is a 74 year old female with past medical history of congestive heart failure, COPD, hypertension, and liver cirrhosis.  She is on home oxygen at 3 L nasal cannula.  She presents this evening for evaluation of shortness of breath.  She reports not feeling well for the past week.  She was seen by her pulmonologist and given prednisone and Zithromax, however her breathing has worsened.  Patient was found by EMS to have low oxygen saturations and intermittent A. fib with RVR.  Patient denies to me she is having any chest pain.  She denies any fevers or chills.  She denies any swelling in her legs.  The history is provided by the patient.  Shortness of Breath Severity:  Moderate Onset quality:  Gradual Duration:  1 week Timing:  Constant Progression:  Worsening Chronicity:  Recurrent Context: activity   Relieved by:  Nothing Worsened by:  Nothing      Past Medical History:  Diagnosis Date  . Acute respiratory failure with hypoxia and hypercapnea 02/26/2014  . Allergic rhinitis due to pollen   . Allergy   . Arthritis   . Asthma   . CHF (congestive heart failure) ([place])   . COPD (chronic obstructive pulmonary disease) ([place])   . Diastolic dysfunction    a. 02/2014 Echo: EF 65-70%, Gr 1 DD, RVH;  b. 09/2015 Echo: EF 55%, Gr 1 DD.  [name] Emphysema of lung ([place])   . H/O seasonal allergies   . History of chicken pox   . History of tobacco abuse    a. Quit 2015.  [name] History of UTI   . Hypertension   . Hypertensive heart disease   . Lower extremity edema   . Pericardial effusion    a. 09/2015 Echo: Mod eff w/o tamponade.  . Right heart failure with reduced right ventricular function ([place])    a. 09/2015 Echo: EF 55%, Gr 1 DD, D  shaped IV septum, sev dil RV with mod reduced fxn, mod dil RA, RV-RA grad 59mHg, PASP 867mg, mod pericard eff w/o tamponade.  . Severe Pulmonary Hypertension    a. 09/2015 Echo: PASP 8720m.    Patient Active Problem List   Diagnosis Date Noted  . Chronic respiratory failure with hypoxia and hypercapnia ([place]0/31/2018  . Pleural effusion on right 07/08/2017  . Chronic respiratory failure with hypoxia (HC[place]2/03/2016  . Hepatic cirrhosis ([place]/02/2016  . Pulmonary artery hypertension ([place] . Acute on chronic diastolic congestive heart failure ([place] . Acute on chronic respiratory failure with hypoxia ([place] . COPD GOLD II criteria but 02 dep 24/7    . Severe Pulmonary Hypertension   . Pericardial effusion   . Diastolic dysfunction   . Congestive heart failure (CHF) ([place]CC[place]1/23/2017  . Encounter for routine gynecological examination 05/03/2014  . Mild diastolic dysfunction 06/53/66/4403 HTN (hypertension) 03/19/2012  . Allergic rhinitis 01/15/2007    Past Surgical History:  Procedure Laterality Date  . CARDIAC CATHETERIZATION N/A 10/08/2015   Procedure: Right/Left Heart Cath and Coronary Angiography;  Surgeon: DanJolaine ArtistD;  Location: [facility];  Service: Cardiovascular;  Laterality: N/A;  . CHOLECYSTECTOMY     Open  .  RIGHT HEART CATH N/A 01/26/2017   Procedure: Right Heart Cath;  Surgeon: Jolaine Artist, MD;  Location: [facility];  Service: Cardiovascular;  Laterality: N/A;  . RIGHT HEART CATH N/A 08/12/2017   Procedure: RIGHT HEART CATH;  Surgeon: Jolaine Artist, MD;  Location: [facility];  Service: Cardiovascular;  Laterality: N/A;     OB History   No obstetric history on file.     Family History  Problem Relation Age of Onset  . Glaucoma Brother   . Vascular Disease Father        Died of complications from surgery  . Heart attack Father   . Asthma Mother        Died of asthma attack  . Dementia Mother   .  Hyperlipidemia Mother   . Hypertension Mother     Social History   Tobacco Use  . Smoking status: Former Smoker    Packs/day: 1.00    Years: 40.00    Pack years: 40.00    Types: Cigarettes    Quit date: 02/22/2014    Years since quitting: 5.9  . Smokeless tobacco: Never Used  Vaping Use  . Vaping Use: Never used  Substance Use Topics  . Alcohol use: No  . Drug use: No    Home Medications Prior to Admission medications   Medication Sig Start Date End Date Taking? Authorizing Provider  acetaminophen (TYLENOL) 325 MG tablet Take 325 mg by mouth at bedtime as needed for moderate pain or headache.     [provider]  albuterol (PROVENTIL HFA;VENTOLIN HFA) 108 (90 Base) MCG/ACT inhaler Inhale 2 puffs every 6 (six) hours as needed into the lungs for wheezing or shortness of breath. 07/17/17   Tanda Rockers, MD  ambrisentan (LETAIRIS) 10 MG tablet TAKE 1 TABLET (10MG) BY MOUTH DAILY. DO NOT HANDLE IF PREGNANT. DO NOTSPLIT, CRUSH, OR CHEW. AVOID INHALATION AND CONTACT WITH SKIN OR EYES . CALL [phone] TO REFILL. 05/12/19   Bensimhon, Shaune Pascal, MD  aspirin EC 81 MG EC tablet Take 1 tablet (81 mg total) by mouth daily. 10/09/15   Debbe Odea, MD  azithromycin (ZITHROMAX) 250 MG tablet Take 2 on day one then 1 daily x 4 days 02/13/20   Tanda Rockers, MD  clotrimazole (MYCELEX) 10 MG troche Take 1 tablet (10 mg total) by mouth 5 (five) times daily. 02/13/20   Tanda Rockers, MD  fluticasone (FLONASE) 50 MCG/ACT nasal spray USE 2 SPRAYS IN [facility] NOSTRIL ONCE A DAY 08/29/19   Tanda Rockers, MD  furosemide (LASIX) 40 MG tablet TAKE 1 TABLET BY MOUTH TWICE A DAY 12/12/19   Bensimhon, Shaune Pascal, MD  ipratropium (ATROVENT) 0.06 % nasal spray PLACE 2 SPRAYS INTO BOTH NOSTRILS 2 (TWO) TIMES DAILY 01/24/20   Bensimhon, Shaune Pascal, MD  OXYGEN 2 to 2lpm 24/7    [provider]  Polyethyl Glycol-Propyl Glycol (SYSTANE OP) Apply 1-2 drops to eye daily as needed (dry eyes).    [provider]  predniSONE (DELTASONE) 10 MG tablet Take  4 each am x 2 days,   2 each am x 2 days,  1 each am x 2 days and stop 02/13/20   Tanda Rockers, MD  spironolactone (ALDACTONE) 25 MG tablet TAKE 1/2 TABLET BY MOUTH EVERY DAY 10/17/19   Bensimhon, Shaune Pascal, MD  tadalafil, PAH, (ADCIRCA) 20 MG tablet Take 2 tablets (40 mg total) by mouth daily. 05/12/19   Bensimhon, Quillian Quince  R, MD  TRELEGY ELLIPTA 100-62.5-25 MCG/INH AEPB INHALE 2 PUFFS INTO THE LUNGS EVERY DAY 11/17/19   Tanda Rockers, MD    Allergies    Amoxicillin-pot clavulanate, Cefdinir, Moxifloxacin, and Singulair [montelukast sodium]  Review of Systems   Review of Systems  Respiratory: Positive for shortness of breath.   All other systems reviewed and are negative.   Physical Exam Updated Vital Signs BP 97/61 (BP Location: Right Arm)   Pulse (!) 105   Temp 97.6 F (36.4 C) (Oral)   Resp (!) 26   Ht _0  (1.6 m)   Wt 61.2 kg   SpO2 96%   BMI 23.91 kg/m   Physical Exam Vitals and nursing note reviewed.  Constitutional:      General: She is not in acute distress.    Appearance: She is well-developed. She is not diaphoretic.  HENT:     Head: Normocephalic and atraumatic.  Cardiovascular:     Rate and Rhythm: Normal rate and regular rhythm.     Heart sounds: No murmur heard.  No friction rub. No gallop.   Pulmonary:     Effort: Tachypnea and accessory muscle usage present. No respiratory distress.     Breath sounds: Examination of the right-middle field reveals rhonchi. Examination of the left-middle field reveals rhonchi. Rhonchi present. No wheezing.     Comments: Patient in mild respiratory distress.  She is able to speak in full sentences. Abdominal:     General: Bowel sounds are normal. There is no distension.     Palpations: Abdomen is soft.     Tenderness: There is no abdominal tenderness.  Musculoskeletal:        General: Normal range of motion.     Cervical back: Normal range of motion and neck supple.      Right lower leg: No tenderness. No edema.     Left lower leg: No tenderness. No edema.  Skin:    General: Skin is warm and dry.  Neurological:     Mental Status: She is alert and oriented to person, place, and time.     ED Results / Procedures / Treatments   Labs (all labs ordered are listed, but only abnormal results are displayed) Labs Reviewed  SARS CORONAVIRUS 2 BY RT PCR ([facility])  COMPREHENSIVE METABOLIC PANEL  BRAIN NATRIURETIC PEPTIDE  CBC WITH DIFFERENTIAL/PLATELET  PROTIME-INR  TROPONIN I (HIGH SENSITIVITY)    EKG None  Radiology No results found.  Procedures Procedures (including critical care time)  Medications Ordered in ED Medications - No data to display  ED Course  I have reviewed the triage vital signs and the nursing notes.  Pertinent labs & imaging results that were available during my care of the patient were reviewed by me and considered in my medical decision making (see chart for details).    MDM Rules/Calculators/A&P  Patient with history of COPD, CHF, cirrhosis is on home oxygen presenting with complaints of shortness of breath.  I suspect COPD/possible pneumonia based on presentation.  Her x-ray does not show overt heart failure and her BNP is not elevated above baseline.  Cardiac work-up thus far unremarkable, although she did have 2 runs of A. fib with RVR with EMS.  This has resolved and she has been in sinus rhythm since.  Patient to undergo CT scan of the chest to rule out pulmonary embolism and explain her increased oxygen requirement.  Care will be signed out to Dr.  Haviland at shift change.  She will obtain the results of this study and determine the final disposition.  I anticipate admission.  Final Clinical Impression(s) / ED Diagnoses Final diagnoses:  None    Rx / DC Orders ED Discharge Orders    None       Veryl Speak, MD 02/21/20 1505

## 2020-02-19 NOTE — Progress Notes (Signed)
*  PRELIMINARY RESULTS* Echocardiogram 2D Echocardiogram has been performed.  Samuel Germany 02/19/2020, 12:58 PM

## 2020-02-19 NOTE — Consult Note (Addendum)
Cardiology Consultation:   Patient ID: Linda Stevens MRN: [id number]; DOB: 07-24-1946  Admit date: 02/19/2020 Date of Consult: 02/19/2020  Primary Care Provider: [name], MD [facility] Cardiologist: Bensimhon   Patient Profile:   Linda Stevens is a 74 y.o. female with a hx of COPD and pulmonary HTN  who is being seen today for the evaluation of pericardial effusion  at the request of Dr Tamala Julian   History of Present Illness:   Ms. Credeur is a petiite 74 yo who looks older than stated age   She has a history of HTN, COPD (quit tob in 2015), allergies, asthma, pulmonary HTN and diastolic dysfunction   She is followed by D Bensimhon and Selinda Orion in clinic Serologies negative .  She has had pleural effusions in past (transudative) and also pericardial effusion (moderate Jan 2017).  Cath in Jan 2017 due to progressive SOB showed normal coronary arteries, normal CO  Severe PAH with PAP 104/49 and 13.6 WU  Started on She had been on Revatio, Adcirca and Letairis.    Pt also with COPD with FEV1 0.91 (40%), FVC 1.64 (55%)  and DLCO 8.8  (38%).  RV size has normalized   R heart cath in May 2018 RV 48/4  PA 45/17  PCWP 8   PVR 4.4 WU   The pt was seen by D Bensimhon in April 2021  At that time she had an cho done that showed normal LVEf and RVEF   There was a trivial pericardial effusion  IVC was normal  The daughter is with the patient today   Per her the pt has had increased SOB over several weeks   No dizziness  No syncope   No palpitations   She was seen by Selinda Orion this past week Monday and placed on a ZPac and steroids   A little more jittery with this   Still SOB    On Thursday started having L sided pleuritic pain  Last night SOB got worse  Called EMS   Brought to [facility] ED EMS tele strips showed afib with RVR   She was given a medicine (? Dilt) and next strips show SR.  In ED  CT dones that showed no PE, bilateral effusion with consolication, likely pneumonia.   Emphsema  noted and also sizeable pericardial effusion Since the pt has had an echo done that showed large pericardial effusion.  Did not meet echo criteria for tamponade but IVC dilated/fixed.   She was also started on IV abx and steroids   She still is SOB   No CP   No dizziness      Past Medical History:  Diagnosis Date  . Acute respiratory failure with hypoxia and hypercapnea 02/26/2014  . Allergic rhinitis due to pollen   . Allergy   . Arthritis   . Asthma   . CHF (congestive heart failure) ([place])   . COPD (chronic obstructive pulmonary disease) ([place])   . Diastolic dysfunction    a. 02/2014 Echo: EF 65-70%, Gr 1 DD, RVH;  b. 09/2015 Echo: EF 55%, Gr 1 DD.  [name] Emphysema of lung ([place])   . H/O seasonal allergies   . History of chicken pox   . History of tobacco abuse    a. Quit 2015.  [name] History of UTI   . Hypertension   . Hypertensive heart disease   . Lower extremity edema   . Pericardial effusion    a. 09/2015 Echo:  Mod eff w/o tamponade.  . Right heart failure with reduced right ventricular function ([place])    a. 09/2015 Echo: EF 55%, Gr 1 DD, D shaped IV septum, sev dil RV with mod reduced fxn, mod dil RA, RV-RA grad 60mHg, PASP 827mg, mod pericard eff w/o tamponade.  . Severe Pulmonary Hypertension    a. 09/2015 Echo: PASP 8716m.    Past Surgical History:  Procedure Laterality Date  . CARDIAC CATHETERIZATION N/A 10/08/2015   Procedure: Right/Left Heart Cath and Coronary Angiography;  Surgeon: DanJolaine ArtistD;  Location: [facility];  Service: Cardiovascular;  Laterality: N/A;  . CHOLECYSTECTOMY     Open  . RIGHT HEART CATH N/A 01/26/2017   Procedure: Right Heart Cath;  Surgeon: BenJolaine ArtistD;  Location: [facility];  Service: Cardiovascular;  Laterality: N/A;  . RIGHT HEART CATH N/A 08/12/2017   Procedure: RIGHT HEART CATH;  Surgeon: BenJolaine ArtistD;  Location: [facility];  Service: Cardiovascular;  Laterality: N/A;       Inpatient  Medications: Scheduled Meds: . acidophilus  1 capsule Oral Daily  . enoxaparin (LOVENOX) injection  40 mg Subcutaneous Q24H  . guaiFENesin  600 mg Oral BID  . ondansetron (ZOFRAN) IV  4 mg Intravenous Once   Continuous Infusions:   Allergies:    Allergies  Allergen Reactions  . Amoxicillin-Pot Clavulanate     REACTION: gi upset Has patient had a PCN reaction causing immediate rash, facial/tongue/throat swelling, SOB or lightheadedness with hypotension: No Has patient had a PCN reaction causing severe rash involving mucus membranes or skin necrosis: No Has patient had a PCN reaction that required hospitalization : No Has patient had a PCN reaction occurring within the last 10 years: No If all of the above answers are "NO", then may proceed with Cephalosporin use.   . Cefdinir     REACTION: gi  upset  . Moxifloxacin     REACTION: rash  . Singulair [Montelukast Sodium]     Itching     Social History:   Social History   Socioeconomic History  . Marital status: Married    Spouse name: Not on file  . Number of children: Not on file  . Years of education: 2  . Highest education level: Not on file  Occupational History  . Occupation: retired teaPharmacist, hospitalobacco Use  . Smoking status: Former Smoker    Packs/day: 1.00    Years: 40.00    Pack years: 40.00    Types: Cigarettes    Quit date: 02/22/2014    Years since quitting: 5.9  . Smokeless tobacco: Never Used  Vaping Use  . Vaping Use: Never used  Substance and Sexual Activity  . Alcohol use: No  . Drug use: No  . Sexual activity: Never  Other Topics Concern  . Not on file  Social History Narrative   Married with 2 children.  Independent of ADLs.      Does not have a living will.   Would desire CPR but would not want prolonged life support if futile- husband aware.   Social Determinants of Health   Financial Resource Strain:   . Difficulty of Paying Living Expenses:   Food Insecurity:   . Worried About RunPaediatric nurse the Last Year:   . RanArboriculturist the Last Year:   Transportation Needs:   . LacFilm/video editoredical):   . LMarland Kitchenck of Transportation (Non-Medical):  Physical Activity:   . Days of Exercise per Week:   . Minutes of Exercise per Session:   Stress:   . Feeling of Stress :   Social Connections:   . Frequency of Communication with Friends and Family:   . Frequency of Social Gatherings with Friends and Family:   . Attends Religious Services:   . Active Member of Clubs or Organizations:   . Attends Archivist Meetings:   [name] Marital Status:   Intimate Partner Violence:   . Fear of Current or Ex-Partner:   . Emotionally Abused:   [name] Physically Abused:   . Sexually Abused:     Family History:    Family History  Problem Relation Age of Onset  . Glaucoma Brother   . Vascular Disease Father        Died of complications from surgery  . Heart attack Father   . Asthma Mother        Died of asthma attack  . Dementia Mother   . Hyperlipidemia Mother   . Hypertension Mother      ROS:  Please see the history of present illness.   All other ROS reviewed and negative.     Physical Exam/Data:   Vitals:   02/19/20 1320 02/19/20 1321 02/19/20 1322 02/19/20 1323  BP:      Pulse: 84 86 85 84  Resp: _0 (!) 27  Temp:      TempSrc:      SpO2: 98% 99% 100% 100%  Weight:      Height:        Intake/Output Summary (Last 24 hours) at 02/19/2020 1338 Last data filed at 02/19/2020 1151 Gross per 24 hour  Intake 100 ml  Output --  Net 100 ml   Last 3 Weights 02/19/2020 12/09/2019 05/12/2019  Weight (lbs) 135 lb 138 lb 136 lb 3.2 oz  Weight (kg) 61.236 kg 62.596 kg 61.78 kg     Body mass index is 23.91 kg/m.  General:  Thin 74 yo in NAD   No pulsus paradoxus  HEENT: normal  Lymph: no adenopathy Neck: no JVD Endocrine:  No thryomegaly Vascular: No carotid bruits; FA pulses 2+ bilaterally without bruits  Cardiac:  normal S1, S2; RRR; no murmur No rub    Lungs: Rales at bases.   Decreased breath sounds R base   No signif wheezing  Abd: soft, nontender, no hepatomegaly  Ext: no edema  Feet warm   Musculoskeletal:  No deformities, BUE and BLE strength normal and equal Skin: warm and dry   Neuro:  CNs 2-12 intact, no focal abnormalities noted Psych:  Normal affect   EKG:  The EKG was personally reviewed and demonstrates:  In EMS:  Afib with RVR  In ED   Ectopic atrial tachycardia.   [place] wave changes cannot exclude ischemia    Telemetry:  Telemetry was personally reviewed and demonstrates:    Relevant CV Studies:  Echo:  02/19/20  Left ventricular ejection fraction, by estimation, is 55 to 60%. The left ventricle has normal function. The left ventricle has no regional wall motion abnormalities. There is moderate left ventricular hypertrophy. Left ventricular diastolic parameters are consistent with Grade I diastolic dysfunction (impaired relaxation). 2. Right ventricular systolic function is mildly reduced. The right ventricular size is normal. Mildly increased right ventricular wall thickness. 3. A large pericardial effusions surrounds heart, mor prominent in the inferior and lateral surfaces There is no significant respiratory variation in  mitral inflow, ther RV/RA do not collapse; the IVC is dilated however with decreased respiratory variation. Ther is some soft tissue attenuation within this, may reflect some pericardial fat vs consolidation. Compared to echo from April 2021 this is new. Effusioon was trivial at that time Clinical correlation indicated. . Large pericardial effusion. The pericardial effusion is circumferential. 4. The mitral valve is grossly normal. Mild mitral valve regurgitation. 5. The aortic valve is abnormal. Aortic valve regurgitation is not visualized. Mild aortic valve sclerosis is present, with no evidence of aortic valve stenosis. 6. The inferior vena cava is dilated in size with <50% respiratory  variability, suggesting right atrial pressure of 15 mmHg.   [place] 12/18 RA = 5 RV = 56/9 PA = 52/17 (33) PCW = 10 Fick cardiac output/index = 7.5/4.5 PVR = 3.5 WU Ao sat = 96% PA sat = 73%, 72% High SVC sat = 76%  VQ scan low prob. CT chest mild COPD. Moderate pericardial effusion. 3v CAD. + cirrhosis. Hepatitis serologies negative.   PFTs  (FEV1 1.23 (52%) with FVC 2.01 (65%) and DLCO 51% in 7/15) PFT's 10/08/2015 FEV1 0.91 (40 %) ratio 56%DLCO 38 % corrects to 63 % for alv volume  PFTs  12/06/15 FEV1 1.0 (44%) FVC 1.74 (58%0 DLCO 28%  R/LHC 10/08/15 with normal coronaries, normal CO, and sever PAH with 13.6 WU.  Findings: Ao = 108/68 (86) LV = 102/10/11 RA = 9 RV = 97/8/12 PA = 104/49 (72) PCW = 18 Fick cardiac output/index = 3.96/2.34 PVR = 13.6 WU FA sat = 93% PA sat = 66%, 69%  RHC 5/18 RA = 2 RV = 48/4 PA = 45/17 (30) PCW = 8 Fick cardiac output/index = 4.9/3.0 PVR = 4.4 WU Ao sat = 94% PA sat = 68%, 70%  Laboratory Data:  High Sensitivity Troponin:   Recent Labs  Lab 02/19/20 0405 02/19/20 0609  TROPONINIHS 4 10     Chemistry Recent Labs  Lab 02/19/20 0405  NA 138  K 4.3  CL 92*  CO2 30  GLUCOSE 205*  BUN 13  CREATININE 1.01*  CALCIUM 7.9*  GFRNONAA 56*  GFRAA >60  ANIONGAP 16*    Recent Labs  Lab 02/19/20 0405  PROT 6.1*  ALBUMIN 3.1*  AST 90*  ALT 34  ALKPHOS 130*  BILITOT 1.9*   Hematology Recent Labs  Lab 02/19/20 0405  WBC 11.4*  RBC 3.81*  HGB 12.0  HCT 38.5  MCV 101.0*  MCH 31.5  MCHC 31.2  RDW 16.0*  PLT 309   BNP Recent Labs  Lab 02/19/20 0405  BNP 164.0*    DDimer No results for input(s): DDIMER in the last 168 hours.   Radiology/Studies:  CT Angio Chest PE W and/or Wo Contrast  Result Date: 02/19/2020 CLINICAL DATA:  Shortness of breath EXAM: CT ANGIOGRAPHY CHEST WITH CONTRAST TECHNIQUE: Multidetector CT imaging of the chest was performed using the standard protocol during bolus  administration of intravenous contrast. Multiplanar CT image reconstructions and MIPs were obtained to evaluate the vascular anatomy. CONTRAST:  80 mL Omnipaque 350 nonionic COMPARISON:  Chest CT July 24, 2017; chest radiograph February 19, 2020 FINDINGS: Cardiovascular: There is no demonstrable pulmonary embolus. There is no thoracic aortic aneurysm. No dissection seen. Note that the contrast bolus in the aorta is not sufficient for confident dissection assessment. There is calcification at multiple sites in visualized great vessels. There are multiple foci of aortic atherosclerosis. There is a sizable pericardial effusion. The pericardium does not  appear appreciably thickened. There are foci of coronary artery calcification. The main pulmonary outflow tract measures 3.2 cm, prominent. Mediastinum/Nodes: Thyroid appears unremarkable. There is no appreciable thoracic adenopathy. No esophageal lesions are evident. Lungs/Pleura: There is underlying centrilobular emphysematous change. There are pleural effusions bilaterally with airspace consolidation in each lower lobe with associated areas of atelectatic change. Upper Abdomen: There is upper abdominal aortic atherosclerosis. Visualized upper abdominal structures otherwise appear unremarkable. Musculoskeletal: There are foci degenerative change in the thoracic spine. No blastic or lytic bone lesions are evident. There are no chest wall lesions. Review of the MIP images confirms the above findings. IMPRESSION: 1. No demonstrable pulmonary embolus. No thoracic aortic aneurysm. No dissection seen with note that contrast bolus in the aorta is not sufficient for confident dissection assessment. There are foci of aortic atherosclerosis as well as foci of great vessel and coronary artery calcification. 2.  Sizable pericardial effusion. 3. Bilateral pleural effusions with consolidation, likely pneumonia, in the bases as well as bibasilar atelectasis. There is underlying  centrilobular emphysematous change. 4. Prominence of the main pulmonary outflow tract is indicative of pulmonary arterial hypertension. 5.  No evident adenopathy. Aortic Atherosclerosis (ICD10-I70.0) and Emphysema (ICD10-J43.9). Electronically Signed   By: Lowella Grip III M.D.   On: 02/19/2020 09:46   DG Chest Port 1 View  Result Date: 02/19/2020 CLINICAL DATA:  Shortness of breath EXAM: PORTABLE CHEST 1 VIEW COMPARISON:  12/09/2019 FINDINGS: Moderate cardiomegaly with small pleural effusions and bibasilar atelectasis. No overt pulmonary edema. Calcific aortic atherosclerosis. IMPRESSION: Cardiomegaly with small pleural effusions and bibasilar atelectasis. Electronically Signed   By: Ulyses Jarred M.D.   On: 02/19/2020 04:35   ECHOCARDIOGRAM COMPLETE  Result Date: 02/19/2020    ECHOCARDIOGRAM REPORT   Patient Name:   KIEANA LIVESAY Date of Exam: 02/19/2020 Medical Rec #:  [id number]           Height:       63.0 in Accession #:    [id number]          Weight:       135.0 lb Date of Birth:  [birth year]           BSA:          1.636 m Patient Age:    54 years            BP:           104/65 mmHg Patient Gender: F                   HR:           88 bpm. Exam Location:  Inpatient Procedure: 2D Echo, Cardiac Doppler and Color Doppler STAT ECHO                            MODIFIED REPORT: This report was modified by Dorris Carnes MD on 02/19/2020 due to Complete.  Indications:     Pericardial effusion 423.9 / I31.3  History:         Patient has prior history of Echocardiogram examinations, most                  recent 12/09/2019. CHF, COPD; Risk Factors:Hypertension and                  Current Smoker. Chronic respiratory failure with hypoxia.  Sonographer:     Alvino Chapel RCS Referring Phys:  [id number] [place]  Diagnosing Phys: Dorris Carnes MD IMPRESSIONS  1. Left ventricular ejection fraction, by estimation, is 55 to 60%. The left ventricle has normal function. The left ventricle has no regional wall motion  abnormalities. There is moderate left ventricular hypertrophy. Left ventricular diastolic parameters are consistent with Grade I diastolic dysfunction (impaired relaxation).  2. Right ventricular systolic function is mildly reduced. The right ventricular size is normal. Mildly increased right ventricular wall thickness.  3. A large pericardial effusions surrounds heart, mor prominent in the inferior and lateral surfaces There is no significant respiratory variation in mitral inflow, ther RV/RA do not collapse; the IVC is dilated however with decreased respiratory variation.     Ther is some soft tissue attenuation within this, may reflect some pericardial fat vs consolidation. Compared to echo from April 2021 this is new. Effusioon was trivial at that time Clinical correlation indicated. . Large pericardial effusion. The pericardial effusion is circumferential.  4. The mitral valve is grossly normal. Mild mitral valve regurgitation.  5. The aortic valve is abnormal. Aortic valve regurgitation is not visualized. Mild aortic valve sclerosis is present, with no evidence of aortic valve stenosis.  6. The inferior vena cava is dilated in size with <50% respiratory variability, suggesting right atrial pressure of 15 mmHg. FINDINGS  Left Ventricle: Left ventricular ejection fraction, by estimation, is 55 to 60%. The left ventricle has normal function. The left ventricle has no regional wall motion abnormalities. The left ventricular internal cavity size was small. There is moderate  left ventricular hypertrophy. Left ventricular diastolic parameters are consistent with Grade I diastolic dysfunction (impaired relaxation). Right Ventricle: The right ventricular size is normal. Mildly increased right ventricular wall thickness. Right ventricular systolic function is mildly reduced. Left Atrium: Left atrial size was normal in size. Right Atrium: Right atrial size was normal in size. Pericardium: A large pericardial effusions  surrounds heart, mor prominent in the inferior and lateral surfaces There is no significant respiratory variation in mitral inflow, ther RV/RA do not collapse; the IVC is dilated however with decreased respiratory variation. Ther is some soft tissue attenuation within this, may reflect some pericardial fat vs consolidation. Compared to echo from April 2021 this is new. Effusioon was trivial at that time Clinical correlation indicated. A large pericardial effusion is present.  The pericardial effusion is circumferential. Mitral Valve: The mitral valve is grossly normal. Mild mitral valve regurgitation. Tricuspid Valve: The tricuspid valve is normal in structure. Tricuspid valve regurgitation is trivial. Aortic Valve: The aortic valve is abnormal. Aortic valve regurgitation is not visualized. Mild aortic valve sclerosis is present, with no evidence of aortic valve stenosis. Pulmonic Valve: The pulmonic valve was not well visualized. Pulmonic valve regurgitation is not visualized. Aorta: The aortic root and ascending aorta are structurally normal, with no evidence of dilitation. Venous: The inferior vena cava is dilated in size with less than 50% respiratory variability, suggesting right atrial pressure of 15 mmHg. IAS/Shunts: No atrial level shunt detected by color flow Doppler.  LEFT VENTRICLE PLAX 2D LVIDd:         3.60 cm  Diastology LVIDs:         2.50 cm  LV e' lateral:   5.22 cm/s LV PW:         1.00 cm  LV E/e' lateral: 16.3 LV IVS:        0.90 cm  LV e' medial:    8.05 cm/s LVOT diam:     1.80 cm  LV E/e' medial:  10.6  LV SV:         56 LV SV Index:   35 LVOT Area:     2.54 cm  RIGHT VENTRICLE RV S prime:     11.00 cm/s TAPSE (M-mode): 1.6 cm LEFT ATRIUM             Index       RIGHT ATRIUM           Index LA Vol (A2C):   54.2 ml 33.13 ml/m RA Area:     15.00 cm LA Vol (A4C):   45.3 ml 27.69 ml/m RA Volume:   35.30 ml  21.57 ml/m LA Biplane Vol: 51.6 ml 31.54 ml/m  AORTIC VALVE LVOT Vmax:   107.00 cm/s  LVOT Vmean:  63.200 cm/s LVOT VTI:    0.222 m MITRAL VALVE MV Area (PHT): 3.77 cm    SHUNTS MV Decel Time: 201 msec    Systemic VTI:  0.22 m MV E velocity: 85.20 cm/s  Systemic Diam: 1.80 cm MV A velocity: 93.60 cm/s MV E/A ratio:  0.91 Dorris Carnes MD Electronically signed by Dorris Carnes MD Signature Date/Time: 02/19/2020/1:33:51 PM    Final (Updated)      Assessment and Plan:   1  Pericardial effusion   New from last echo in April 2021  (trivial attime)   LArge now    Does not meet echo criteria for tamponade and clinically  BP is soft but HR is normal     I would recomm following closely   WIll need serial echoes   May end up with tap/window    I would not recomm diuretics as this may decrease filling and pt may decompensate further   2   Atrial fibrillation   I will review but I do not see that she has been in afib in the past    She won't tolerate this  Currently not in it.  May be related to stress from pulmonary issues and effusion. Check TSH if not done   Follow on tele BP wont tolerate additional meds and pulm would not tolerate amio.  3  Pulmonary COPD GOld III Seen by Selinda Orion   Was started on Z pac and steroids earlier last week   No improvement   Chest CT concerning for pneumonia   Would switch to I  4  Pulmonary  HTN Last cath in dec 2018 with milid PAH  PAP 33.  NYHA  II.  Keep on Ralene Cork and TrelegyEllipta.  I would hold lasix right now with BP    4  Cirrhosis.  Felt possibly to be from Rheart failure   For questions or updates, please contact [place] Please consult [email or web address] for contact info under    Signed, Dorris Carnes, MD  02/19/2020 1:38 PM

## 2020-02-19 NOTE — ED Provider Notes (Signed)
Pt signed out from Dr. Stark Jock.  Pt's CT scan showed:    IMPRESSION:  1. No demonstrable pulmonary embolus. No thoracic aortic aneurysm.  No dissection seen with note that contrast bolus in the aorta is not  sufficient for confident dissection assessment. There are foci of  aortic atherosclerosis as well as foci of great vessel and coronary  artery calcification.    2. Sizable pericardial effusion.    3. Bilateral pleural effusions with consolidation, likely pneumonia,  in the bases as well as bibasilar atelectasis. There is underlying  centrilobular emphysematous change.    4. Prominence of the main pulmonary outflow tract is indicative of  pulmonary arterial hypertension.    5. No evident adenopathy.    Aortic Atherosclerosis (ICD10-I70.0) and Emphysema (ICD10-J43.9).     Pt also had some rhythm strips in her room from EMS.  These strips showed afib with RVR. Pt has never had afib in the past.     Pt placed given Rocephin and Zithromax.  Cephalosporins cause diarrhea, so pt requested yogurt.  We don't have that here, but she is given a probiotic bill.  Pt d/w Dr. Tamala Julian (triad) for admission.  CRITICAL CARE Performed by: Isla Pence   Total critical care time: 30 minutes  Critical care time was exclusive of separately billable procedures and treating other patients.  Critical care was necessary to treat or prevent imminent or life-threatening deterioration.  Critical care was time spent personally by me on the following activities: development of treatment plan with patient and/or surrogate as well as nursing, discussions with consultants, evaluation of patient's response to treatment, examination of patient, obtaining history from patient or surrogate, ordering and performing treatments and interventions, ordering and review of laboratory studies, ordering and review of radiographic studies, pulse oximetry and re-evaluation of patient's condition.       Isla Pence, MD 02/19/20 1035

## 2020-02-19 NOTE — H&P (Addendum)
History and Physical    Linda Stevens ZOX:[id number] DOB: December 27, 1945 DOA: 02/19/2020  Referring MD/NP/PA: Isla Pence, MD PCP: [name], MD  Patient coming from: Home via EMS  Chief Complaint: Could not breathe  I have personally briefly reviewed patient's old medical records in [place]   HPI: Linda Stevens is a 74 y.o. female with medical history significant of hypertension, hyperlipidemia, chronic respiratory failure hypoxia and hypercapnia on 3 L of nasal cannula oxygen at baseline, COPD, diastolic congestive heart failure last EF 70-75% in 12/2019, and pulmonary artery hypertension.  She presents with complaints of progressively worsening shortness of breath over the last week.  Thought symptoms were secondary to humidity.  She followed up with her pulmonologist Dr. Leonides Schanz on 6/9 and was started on a Z-Pak with steroid taper.  However, since that time has had worsening in her symptoms unable to catch her breath last night between 1030 and 11 PM.  Associated symptoms include anxiety, and some left side pain.  Denies having any significant fever, chills, chest pain, leg swelling, or recent change in weight.   ED Course: Admission to the emergency department patient was seen to be afebrile, respirations 18-35, pulse 88-105, blood pressures 84/66-104/65, and O2 saturations currently maintained on 6 L of nasal cannula oxygen.  For WBC 11.4, glucose 205, alkaline phosphatase 130, AST 90, total bilirubin 1.9, lactic acid 2.8, BNP 164, and troponin negative x2.  Chest x-ray noted cardiomegaly with small pleural effusions and bibasilar atelectasis.  CTA of the chest noted a sizable pericardial effusion with bilateral pleural effusions with consolidation concerning for pneumonia and pulmonary artery hypertension.  Patient received 125 mg of Solu-Medrol IV, morphine, Zofran, Rocephin, azithromycin, and probiotics.  TRH called to admit.  Review of Systems  Constitutional:  Positive for malaise/fatigue. Negative for fever and weight loss.  HENT: Negative for nosebleeds.   Eyes: Negative for double vision and photophobia.  Respiratory: Positive for cough, sputum production and shortness of breath. Negative for wheezing.   Cardiovascular: Negative for chest pain and leg swelling.  Gastrointestinal: Negative for constipation, nausea and vomiting.  Genitourinary: Positive for dysuria. Negative for hematuria.  Musculoskeletal: Positive for back pain and myalgias. Negative for joint pain.  Skin: Positive for rash.  Neurological: Negative for focal weakness and loss of consciousness.  Psychiatric/Behavioral: Negative for substance abuse. The patient has insomnia.     Past Medical History:  Diagnosis Date  . Acute respiratory failure with hypoxia and hypercapnea 02/26/2014  . Allergic rhinitis due to pollen   . Allergy   . Arthritis   . Asthma   . CHF (congestive heart failure) ([place])   . COPD (chronic obstructive pulmonary disease) ([place])   . Diastolic dysfunction    a. 02/2014 Echo: EF 65-70%, Gr 1 DD, RVH;  b. 09/2015 Echo: EF 55%, Gr 1 DD.  [name] Emphysema of lung ([place])   . H/O seasonal allergies   . History of chicken pox   . History of tobacco abuse    a. Quit 2015.  [name] History of UTI   . Hypertension   . Hypertensive heart disease   . Lower extremity edema   . Pericardial effusion    a. 09/2015 Echo: Mod eff w/o tamponade.  . Right heart failure with reduced right ventricular function ([place])    a. 09/2015 Echo: EF 55%, Gr 1 DD, D shaped IV septum, sev dil RV with mod reduced fxn, mod dil RA, RV-RA grad 72mHg, PASP 817mg, mod  pericard eff w/o tamponade.  . Severe Pulmonary Hypertension    a. 09/2015 Echo: PASP 70mHg.    Past Surgical History:  Procedure Laterality Date  . CARDIAC CATHETERIZATION N/A 10/08/2015   Procedure: Right/Left Heart Cath and Coronary Angiography;  Surgeon: DJolaine Artist MD;  [facility];  Service:  Cardiovascular;  Laterality: N/A;  . CHOLECYSTECTOMY     Open  . RIGHT HEART CATH N/A 01/26/2017   Procedure: Right Heart Cath;  Surgeon: BJolaine Artist MD;  [facility];  Service: Cardiovascular;  Laterality: N/A;  . RIGHT HEART CATH N/A 08/12/2017   Procedure: RIGHT HEART CATH;  Surgeon: BJolaine Artist MD;  [facility];  Service: Cardiovascular;  Laterality: N/A;     reports that she quit smoking about 5 years ago. Her smoking use included cigarettes. She has a 40.00 pack-year smoking history. She has never used smokeless tobacco. She reports that she does not drink alcohol and does not use drugs.  Allergies  Allergen Reactions  . Amoxicillin-Pot Clavulanate     REACTION: gi upset Has patient had a PCN reaction causing immediate rash, facial/tongue/throat swelling, SOB or lightheadedness with hypotension: No Has patient had a PCN reaction causing severe rash involving mucus membranes or skin necrosis: No Has patient had a PCN reaction that required hospitalization : No Has patient had a PCN reaction occurring within the last 10 years: No If all of the above answers are "NO", then may proceed with Cephalosporin use.   . Cefdinir     REACTION: gi  upset  . Moxifloxacin     REACTION: rash  . Singulair [Montelukast Sodium]     Itching     Family History  Problem Relation Age of Onset  . Glaucoma Brother   . Vascular Disease Father        Died of complications from surgery  . Heart attack Father   . Asthma Mother        Died of asthma attack  . Dementia Mother   . Hyperlipidemia Mother   . Hypertension Mother     Prior to Admission medications   Medication Sig Start Date End Date Taking? Authorizing Provider  acetaminophen (TYLENOL) 325 MG tablet Take 325 mg by mouth at bedtime as needed for moderate pain or headache.    Yes [provider]  albuterol (PROVENTIL HFA;VENTOLIN HFA) 108 (90 Base) MCG/ACT inhaler Inhale 2  puffs every 6 (six) hours as needed into the lungs for wheezing or shortness of breath. 07/17/17  Yes WTanda Rockers MD  ambrisentan (LETAIRIS) 10 MG tablet TAKE 1 TABLET (10MG) BY MOUTH DAILY. DO NOT HANDLE IF PREGNANT. DO NOTSPLIT, CRUSH, OR CHEW. AVOID INHALATION AND CONTACT WITH SKIN OR EYES . CALL [phone] REFILL. Patient taking differently: Take 10 mg by mouth daily. .Marland KitchenCALL [phone] REFILL. 05/12/19  Yes Bensimhon, DShaune Pascal MD  aspirin EC 81 MG EC tablet Take 1 tablet (81 mg total) by mouth daily. 10/09/15  Yes RDebbe Odea MD  azithromycin (ZITHROMAX) 250 MG tablet Take 2 on day one then 1 daily x 4 days Patient taking differently: Take 250-500 mg by mouth See admin instructions. Take 2 tablets on day 1 then take 1 tablet daily for 4 days 02/13/20  Yes WTanda Rockers MD  clotrimazole (MYCELEX) 10 MG troche Take 1 tablet (10 mg total) by mouth 5 (five) times daily. 02/13/20  Yes WTanda Rockers MD  fluticasone (Asencion Islam  50 MCG/ACT nasal spray USE 2 SPRAYS IN EAXH NOSTRIL ONCE A DAY Patient taking differently: Place 2 sprays into both nostrils daily.  08/29/19  Yes Tanda Rockers, MD  furosemide (LASIX) 40 MG tablet TAKE 1 TABLET BY MOUTH TWICE A DAY Patient taking differently: Take 40 mg by mouth 2 (two) times daily.  12/12/19  Yes Bensimhon, Shaune Pascal, MD  ipratropium (ATROVENT) 0.06 % nasal spray PLACE 2 SPRAYS INTO BOTH NOSTRILS 2 (TWO) TIMES DAILY 01/24/20  Yes Bensimhon, Shaune Pascal, MD  Polyethyl Glycol-Propyl Glycol (SYSTANE OP) Apply 1-2 drops to eye daily as needed (dry eyes).   Yes [provider]  predniSONE (DELTASONE) 10 MG tablet Take  4 each am x 2 days,   2 each am x 2 days,  1 each am x 2 days and stop Patient taking differently: Take 10-40 mg by mouth See admin instructions. Take 4 tablets every morning for 2 days then take 2 tablets every morning for 2 days then take 1 tablet daily for 2 days then stop 02/13/20  Yes Tanda Rockers, MD  spironolactone  (ALDACTONE) 25 MG tablet TAKE 1/2 TABLET BY MOUTH EVERY DAY Patient taking differently: Take 12.5 mg by mouth daily.  10/17/19  Yes Bensimhon, Shaune Pascal, MD  tadalafil, PAH, (ADCIRCA) 20 MG tablet Take 2 tablets (40 mg total) by mouth daily. 05/12/19  Yes Bensimhon, Shaune Pascal, MD  TRELEGY ELLIPTA 100-62.5-25 MCG/INH AEPB INHALE 2 PUFFS INTO THE LUNGS EVERY DAY Patient taking differently: Inhale 2 puffs into the lungs daily.  11/17/19  Yes Tanda Rockers, MD  OXYGEN 2 to 2lpm 24/7    [provider]    Physical Exam:  Constitutional: Chronically ill-appearing elderly female who appears to be no acute distress at this time Vitals:   02/19/20 0747 02/19/20 0757 02/19/20 0845 02/19/20 0901  BP: 95/62  98/65 104/65  Pulse: 93 95 91 88  Resp: (!) 35 _0 Temp:      TempSrc:      SpO2: 97% 97% 97% 97%  Weight:      Height:       Eyes: PERRL, lids and conjunctivae normal ENMT: Mucous membranes are moist. Posterior pharynx clear of any exudate or lesions.   Neck: normal, supple, no masses, no thyromegaly.  Some JVD appreciated Respiratory: Decreased aeration with coarse crackles appreciated in lower lung fields.   Cardiovascular: Regular rate and rhythm, no murmurs / rubs / gallops. No extremity edema. 2+ pedal pulses. No carotid bruits.  Abdomen: no tenderness, no masses palpated. No hepatosplenomegaly. Bowel sounds positive.  Musculoskeletal: no clubbing / cyanosis. No joint deformity upper and lower extremities. Good ROM, no contractures. Normal muscle tone.  Skin: Psoriasis plaques noted on the bilateral elbows and lower extremities.  Telangiectasias noted at the face. Neurologic: CN 2-12 grossly intact. Sensation intact, DTR normal. Strength 5/5 in all 4.  Psychiatric: Normal judgment and insight. Alert and oriented x 3. Normal mood.     Labs on Admission: I have personally reviewed following labs and imaging studies  CBC: Recent Labs  Lab 02/19/20 0405  WBC 11.4*    NEUTROABS 9.8*  HGB 12.0  HCT 38.5  MCV 101.0*  PLT 335   Basic Metabolic Panel: Recent Labs  Lab 02/19/20 0405  NA 138  K 4.3  CL 92*  CO2 30  GLUCOSE 205*  BUN 13  CREATININE 1.01*  CALCIUM 7.9*   GFR: Estimated Creatinine Clearance: 42.3 mL/min (A) (by C-G  formula based on SCr of 1.01 mg/dL (H)). Liver Function Tests: Recent Labs  Lab 02/19/20 0405  AST 90*  ALT 34  ALKPHOS 130*  BILITOT 1.9*  PROT 6.1*  ALBUMIN 3.1*   No results for input(s): LIPASE, AMYLASE in the last 168 hours. No results for input(s): AMMONIA in the last 168 hours. Coagulation Profile: No results for input(s): INR, PROTIME in the last 168 hours. Cardiac Enzymes: No results for input(s): CKTOTAL, CKMB, CKMBINDEX, TROPONINI in the last 168 hours. BNP (last 3 results) No results for input(s): PROBNP in the last 8760 hours. HbA1C: No results for input(s): HGBA1C in the last 72 hours. CBG: No results for input(s): GLUCAP in the last 168 hours. Lipid Profile: No results for input(s): CHOL, HDL, LDLCALC, TRIG, CHOLHDL, LDLDIRECT in the last 72 hours. Thyroid Function Tests: No results for input(s): TSH, T4TOTAL, FREET4, T3FREE, THYROIDAB in the last 72 hours. Anemia Panel: No results for input(s): VITAMINB12, FOLATE, FERRITIN, TIBC, IRON, RETICCTPCT in the last 72 hours. Urine analysis:    Component Value Date/Time   COLORURINE YELLOW 07/07/2017 1625   APPEARANCEUR CLEAR 07/07/2017 1625   LABSPEC 1.025 07/07/2017 1625   PHURINE 6.0 07/07/2017 1625   GLUCOSEU NEGATIVE 07/07/2017 1625   HGBUR NEGATIVE 07/07/2017 1625   HGBUR negative 12/22/2007 1139   BILIRUBINUR SMALL (A) 07/07/2017 1625   KETONESUR NEGATIVE 07/07/2017 1625   UROBILINOGEN 0.2 07/07/2017 1625   NITRITE NEGATIVE 07/07/2017 1625   LEUKOCYTESUR NEGATIVE 07/07/2017 1625   Sepsis Labs: Recent Results (from the past 240 hour(s))  SARS Coronavirus 2 by RT PCR (hospital order, performed in [place] hospital lab)  Nasopharyngeal Nasopharyngeal Swab     Status: None   Collection Time: 02/19/20  5:27 AM   Specimen: Nasopharyngeal Swab  Result Value Ref Range Status   SARS Coronavirus 2 NEGATIVE NEGATIVE Final    Comment: (NOTE) SARS-CoV-2 target nucleic acids are NOT DETECTED.  The SARS-CoV-2 RNA is generally detectable in upper and lower respiratory specimens during the acute phase of infection. The lowest concentration of SARS-CoV-2 viral copies this assay can detect is 250 copies / mL. A negative result does not preclude SARS-CoV-2 infection and should not be used as the sole basis for treatment or other patient management decisions.  A negative result may occur with improper specimen collection / handling, submission of specimen other than nasopharyngeal swab, presence of viral mutation(s) within the areas targeted by this assay, and inadequate number of viral copies (<250 copies / mL). A negative result must be combined with clinical observations, patient history, and epidemiological information.  Fact Sheet for Patients:   StrictlyIdeas.no  Fact Sheet for Healthcare Providers: BankingDealers.co.za  This test is not yet approved or  cleared by the [place] FDA and has been authorized for detection and/or diagnosis of SARS-CoV-2 by FDA under an Emergency Use Authorization (EUA).  This EUA will remain in effect (meaning this test can be used) for the duration of the COVID-19 declaration under Section 564(b)(1) of the Act, 21 U.S.C. section 360bbb-3(b)(1), unless the authorization is terminated or revoked sooner.  Performed at [facility], [address], [place], [place] [address]      Radiological Exams on Admission: CT Angio Chest Stevens W and/or Wo Contrast  Result Date: 02/19/2020 CLINICAL DATA:  Shortness of breath EXAM: CT ANGIOGRAPHY CHEST WITH CONTRAST TECHNIQUE: Multidetector CT imaging of the chest was performed using  the standard protocol during bolus administration of intravenous contrast. Multiplanar CT image reconstructions and  MIPs were obtained to evaluate the vascular anatomy. CONTRAST:  80 mL Omnipaque 350 nonionic COMPARISON:  Chest CT July 24, 2017; chest radiograph February 19, 2020 FINDINGS: Cardiovascular: There is no demonstrable pulmonary embolus. There is no thoracic aortic aneurysm. No dissection seen. Note that the contrast bolus in the aorta is not sufficient for confident dissection assessment. There is calcification at multiple sites in visualized great vessels. There are multiple foci of aortic atherosclerosis. There is a sizable pericardial effusion. The pericardium does not appear appreciably thickened. There are foci of coronary artery calcification. The main pulmonary outflow tract measures 3.2 cm, prominent. Mediastinum/Nodes: Thyroid appears unremarkable. There is no appreciable thoracic adenopathy. No esophageal lesions are evident. Lungs/Pleura: There is underlying centrilobular emphysematous change. There are pleural effusions bilaterally with airspace consolidation in each lower lobe with associated areas of atelectatic change. Upper Abdomen: There is upper abdominal aortic atherosclerosis. Visualized upper abdominal structures otherwise appear unremarkable. Musculoskeletal: There are foci degenerative change in the thoracic spine. No blastic or lytic bone lesions are evident. There are no chest wall lesions. Review of the MIP images confirms the above findings. IMPRESSION: 1. No demonstrable pulmonary embolus. No thoracic aortic aneurysm. No dissection seen with note that contrast bolus in the aorta is not sufficient for confident dissection assessment. There are foci of aortic atherosclerosis as well as foci of great vessel and coronary artery calcification. 2.  Sizable pericardial effusion. 3. Bilateral pleural effusions with consolidation, likely pneumonia, in the bases as well as bibasilar  atelectasis. There is underlying centrilobular emphysematous change. 4. Prominence of the main pulmonary outflow tract is indicative of pulmonary arterial hypertension. 5.  No evident adenopathy. Aortic Atherosclerosis (ICD10-I70.0) and Emphysema (ICD10-J43.9). Electronically Signed   By: Lowella Grip III M.D.   On: 02/19/2020 09:46   DG Chest Port 1 View  Result Date: 02/19/2020 CLINICAL DATA:  Shortness of breath EXAM: PORTABLE CHEST 1 VIEW COMPARISON:  12/09/2019 FINDINGS: Moderate cardiomegaly with small pleural effusions and bibasilar atelectasis. No overt pulmonary edema. Calcific aortic atherosclerosis. IMPRESSION: Cardiomegaly with small pleural effusions and bibasilar atelectasis. Electronically Signed   By: Ulyses Jarred M.D.   On: 02/19/2020 04:35    EKG: Independently reviewed. Ectopic Atrial tachycardia 103   Assessment/Plan Acute on chronic respiratory failure with hypoxia secondary to community acquired pneumonia: Patient presented with complaints of progressively worsening shortness of breath over the last week.  On 6 L nasal cannula oxygen to maintain O2 saturations.  Normally on 3 L at home.  CT of the chest concerning for pneumonia, pleural effusion, and pericardial effusion.  Patient had been treated previously with a Z-Pak and short taper of steroids by her pulmonologist.  Lactic acid initially noted to be elevated up to 2.8.  Blood cultures were obtained. -Admit to a progressive bed -Continuous pulse oximetry with nasal cannula oxygen to maintain O2 saturations -Follow-up blood and sputum cultures -Continue empiric antibiotics of Rocephin and azithromycin  -Mucinex  -Trend lactic acid levels  Pericardial effusion: Patient seen to have a sizable pericardial effusion on CTA of the chest. Initial blood pressures were noted to be soft. Clinically does not appear to be in cardiac tamponade. -Follow-up echocardiogram -Appreciate cardiology consultative services, will  follow-up for further recommendations  New onset atrial fibrillation: Strips from EMS appear to show patient going into atrial fibrillation.  Patient currently appears to be back in sinus rhythm.  CHA2DS2-VASc score =  4(age, sex, chf, HTN).   -Continue to monitor telemetry   Pulmonary artery hypertension  with right-sided heart failure, bilateral pleural effusions: Last echocardiogram revealed EF of 70-75% with grade 1 diastolic dysfunction.  Avoiding diuresis at this time due to soft initial blood pressure -Continue tadalafil and ambrisentan if agreed by cardiology -Held diuretics at this time due to soft pressures  History of cirrhosis, elevated liver enzymes, hyperbilirubinemia: Suspected secondary to right-sided heart failure.  Hypocalcemia: Acute.  Corrected calcium for albumin noted to be 8.2. -Replace when able   DVT prophylaxis: Lovenox, but may need to be changed if need of  Code Status: Full Family Communication: Discussed plan of care with the patient family present at bedside Disposition Plan: To be determined Consults called: Cardiology Admission status: Inpatient   Norval Morton MD [facility] Pager [phone]   If 7PM-7AM, please contact night-coverage [email or web address] Password [facility]  02/19/2020, 10:31 AM

## 2020-02-20 DIAGNOSIS — K7469 Other cirrhosis of liver: Secondary | ICD-10-CM

## 2020-02-20 DIAGNOSIS — R7881 Bacteremia: Secondary | ICD-10-CM | POA: Diagnosis present

## 2020-02-20 DIAGNOSIS — I5033 Acute on chronic diastolic (congestive) heart failure: Secondary | ICD-10-CM

## 2020-02-20 LAB — CBC
HCT: 35.6 % — ABNORMAL LOW (ref 36.0–46.0)
Hemoglobin: 11 g/dL — ABNORMAL LOW (ref 12.0–15.0)
MCH: 31 pg (ref 26.0–34.0)
MCHC: 30.9 g/dL (ref 30.0–36.0)
MCV: 100.3 fL — ABNORMAL HIGH (ref 80.0–100.0)
Platelets: 251 10*3/uL (ref 150–400)
RBC: 3.55 MIL/uL — ABNORMAL LOW (ref 3.87–5.11)
RDW: 15.9 % — ABNORMAL HIGH (ref 11.5–15.5)
WBC: 17.9 10*3/uL — ABNORMAL HIGH (ref 4.0–10.5)
nRBC: 0 % (ref 0.0–0.2)

## 2020-02-20 LAB — LACTIC ACID, PLASMA
Lactic Acid, Venous: 2.2 mmol/L (ref 0.5–1.9)
Lactic Acid, Venous: 2.1 mmol/L (ref 0.5–1.9)

## 2020-02-20 LAB — BLOOD CULTURE ID PANEL (REFLEXED)

## 2020-02-20 LAB — BASIC METABOLIC PANEL
Anion gap: 12 (ref 5–15)
BUN: 25 mg/dL — ABNORMAL HIGH (ref 8–23)
CO2: 37 mmol/L — ABNORMAL HIGH (ref 22–32)
Calcium: 8.3 mg/dL — ABNORMAL LOW (ref 8.9–10.3)
Chloride: 87 mmol/L — ABNORMAL LOW (ref 98–111)
Creatinine, Ser: 1.45 mg/dL — ABNORMAL HIGH (ref 0.44–1.00)
GFR calc Af Amer: 42 mL/min — ABNORMAL LOW (ref >60)
GFR calc non Af Amer: 36 mL/min — ABNORMAL LOW (ref >60)
Glucose, Bld: 136 mg/dL — ABNORMAL HIGH (ref 70–99)
Potassium: 3.7 mmol/L (ref 3.5–5.1)
Sodium: 136 mmol/L (ref 135–145)

## 2020-02-20 LAB — MAGNESIUM: Magnesium: 2.1 mg/dL (ref 1.7–2.4)

## 2020-02-20 LAB — SEDIMENTATION RATE: Sed Rate: 63 mm/hr — ABNORMAL HIGH (ref 0–22)

## 2020-02-20 MED ORDER — METHYLPREDNISOLONE SODIUM SUCC 125 MG IJ SOLR
60.0000 mg | INTRAMUSCULAR | Status: DC
  Administered 2020-02-21 – 2020-02-25 (×5): 60 mg via INTRAVENOUS
  Filled 2020-02-20 (×5): qty 2

## 2020-02-20 MED ORDER — VANCOMYCIN HCL 1250 MG/250ML IV SOLN
1250.0000 mg | Freq: Once | INTRAVENOUS | Status: AC
  Administered 2020-02-20: 1250 mg via INTRAVENOUS
  Filled 2020-02-20: qty 250

## 2020-02-20 MED ORDER — TRAMADOL HCL 50 MG PO TABS
50.0000 mg | ORAL_TABLET | Freq: Four times a day (QID) | ORAL | Status: DC | PRN
  Administered 2020-02-20 – 2020-02-22 (×5): 50 mg via ORAL
  Filled 2020-02-20 (×5): qty 1

## 2020-02-20 MED ORDER — AMIODARONE LOAD VIA INFUSION
150.0000 mg | Freq: Once | INTRAVENOUS | Status: AC
  Administered 2020-02-20: 150 mg via INTRAVENOUS
  Filled 2020-02-20: qty 83.34

## 2020-02-20 MED ORDER — AMIODARONE HCL IN DEXTROSE 360-4.14 MG/200ML-% IV SOLN
INTRAVENOUS | Status: AC
  Filled 2020-02-20: qty 200

## 2020-02-20 MED ORDER — SODIUM CHLORIDE 0.9 % IV SOLN
2.0000 g | INTRAVENOUS | Status: DC
  Administered 2020-02-20: 2 g via INTRAVENOUS
  Filled 2020-02-20: qty 2
  Filled 2020-02-20: qty 20

## 2020-02-20 MED ORDER — AMIODARONE HCL IN DEXTROSE 360-4.14 MG/200ML-% IV SOLN
60.0000 mg/h | INTRAVENOUS | Status: DC
  Administered 2020-02-20 (×3): 60 mg/h via INTRAVENOUS
  Filled 2020-02-20: qty 200

## 2020-02-20 MED ORDER — AMIODARONE HCL IN DEXTROSE 360-4.14 MG/200ML-% IV SOLN
30.0000 mg/h | INTRAVENOUS | Status: DC
  Administered 2020-02-21 – 2020-02-23 (×7): 30 mg/h via INTRAVENOUS
  Filled 2020-02-20 (×10): qty 200

## 2020-02-20 MED ORDER — VANCOMYCIN HCL IN DEXTROSE 1-5 GM/200ML-% IV SOLN
1000.0000 mg | INTRAVENOUS | Status: DC
  Administered 2020-02-21 – 2020-02-23 (×3): 1000 mg via INTRAVENOUS
  Filled 2020-02-20 (×4): qty 200

## 2020-02-20 MED ORDER — AMIODARONE LOAD VIA INFUSION: 150.0000 mg | Freq: Once | INTRAVENOUS | Status: DC

## 2020-02-20 MED ORDER — PROMETHAZINE HCL 25 MG/ML IJ SOLN
12.5000 mg | Freq: Three times a day (TID) | INTRAMUSCULAR | Status: DC | PRN
  Administered 2020-02-20 – 2020-02-21 (×2): 12.5 mg via INTRAVENOUS
  Filled 2020-02-20 (×3): qty 1

## 2020-02-20 NOTE — Progress Notes (Signed)
   02/20/20 1109  Assess: MEWS Score  Temp 97.7 F (36.5 C)  BP (!) 112/93  Pulse Rate (!) 144  ECG Heart Rate (!) 150  Resp 16  SpO2 93 %  O2 Device Nasal Cannula  Patient Activity (if Appropriate) In bed  O2 Flow Rate (L/min) 3 L/min  Assess: MEWS Score  MEWS Temp 0  MEWS Systolic 0  MEWS Pulse 3  MEWS RR 0  MEWS LOC 0  MEWS Score 3  MEWS Score Color Yellow  Notify: Provider  Provider Name/Title Wendee Beavers MD  Date Provider Notified 02/20/20  Time Provider Notified 1109  Notification Type Page  Notification Reason Change in status  Response Other (Comment) (to Page Dr. Marlou Porch)  Date of Provider Response 02/20/20  Time of Provider Response [id number]

## 2020-02-20 NOTE — Plan of Care (Signed)

## 2020-02-20 NOTE — Progress Notes (Signed)
Blood cultures have just been updated to 4/4 MRSE bacteremia. D/w Dr. Cyndia Skeeters, cont vanc and keep ceftriaxone for now.   Dc azith Continue vancomycin  Onnie Boer, PharmD, [place], AAHIVP, CPP Infectious Disease Pharmacist 02/20/2020 9:22 AM

## 2020-02-20 NOTE — Plan of Care (Signed)

## 2020-02-20 NOTE — Progress Notes (Signed)
Progress Note  Patient Name: Linda Stevens Date of Encounter: 02/20/2020  [facility] Cardiologist: [name], MD   Subjective   Currently in bed.  Breathing slightly improved.  Still with pleuritic type chest pain.  Inpatient Medications    Scheduled Meds: . acidophilus  1 capsule Oral Daily  . ambrisentan  10 mg Oral Daily  . aspirin EC  81 mg Oral Daily  . enoxaparin (LOVENOX) injection  40 mg Subcutaneous Q24H  . fluticasone  2 spray Each Nare Daily  . umeclidinium bromide  2 puff Inhalation Daily   And  . fluticasone furoate-vilanterol  2 puff Inhalation Daily  . guaiFENesin  600 mg Oral BID  . methylPREDNISolone (SOLU-MEDROL) injection  60 mg Intravenous Q8H  . tadalafil (PAH)  40 mg Oral Daily   Continuous Infusions: . azithromycin    . [START ON 02/21/2020] vancomycin     PRN Meds: acetaminophen **OR** acetaminophen, morphine injection, ondansetron **OR** ondansetron (ZOFRAN) IV   Vital Signs    Vitals:   02/20/20 0500 02/20/20 0600 02/20/20 0813 02/20/20 0841  BP: 104/61 107/60 (!) 106/56   Pulse:  83 83 86  Resp: _0 Temp:    97.8 F (36.6 C)  TempSrc:    Oral  SpO2: 97% 98% 96% 92%  Weight:      Height:        Intake/Output Summary (Last 24 hours) at 02/20/2020 0903 Last data filed at 02/19/2020 1151 Gross per 24 hour  Intake 100 ml  Output --  Net 100 ml   Last 3 Weights 02/19/2020 02/19/2020 12/09/2019  Weight (lbs) 138 lb 14.2 oz 135 lb 138 lb  Weight (kg) 63 kg 61.236 kg 62.596 kg      Telemetry    Sinus rhythm 80s- Personally Reviewed  ECG    Sinus tachycardia nonspecific ST-T wave changes- Personally Reviewed  Physical Exam   GEN: No acute distress.  Older than stated age Neck: Positive JVD, telangiectasias on face Cardiac: RRR, no murmurs, rubs, or gallops.  Respiratory:  Decreased breath sounds at bases bilaterally. GI: Soft, nontender, non-distended  MS: No edema; No deformity. Neuro:  Nonfocal   Psych: Normal affect   Labs    High Sensitivity Troponin:   Recent Labs  Lab 02/19/20 0405 02/19/20 0609  TROPONINIHS 4 10      Chemistry Recent Labs  Lab 02/19/20 0405 02/20/20 0240  NA 138 136  K 4.3 3.7  CL 92* 87*  CO2 30 37*  GLUCOSE 205* 136*  BUN 13 25*  CREATININE 1.01* 1.45*  CALCIUM 7.9* 8.3*  PROT 6.1*  --   ALBUMIN 3.1*  --   AST 90*  --   ALT 34  --   ALKPHOS 130*  --   BILITOT 1.9*  --   GFRNONAA 56* 36*  GFRAA >60 42*  ANIONGAP 16* 12     Hematology Recent Labs  Lab 02/19/20 0405 02/20/20 0240  WBC 11.4* 17.9*  RBC 3.81* 3.55*  HGB 12.0 11.0*  HCT 38.5 35.6*  MCV 101.0* 100.3*  MCH 31.5 31.0  MCHC 31.2 30.9  RDW 16.0* 15.9*  PLT 309 251    BNP Recent Labs  Lab 02/19/20 0405  BNP 164.0*     DDimer No results for input(s): DDIMER in the last 168 hours.   Radiology    CT Angio Chest PE W and/or Wo Contrast  Result Date: 02/19/2020 CLINICAL DATA:  Shortness of breath EXAM: CT ANGIOGRAPHY  CHEST WITH CONTRAST TECHNIQUE: Multidetector CT imaging of the chest was performed using the standard protocol during bolus administration of intravenous contrast. Multiplanar CT image reconstructions and MIPs were obtained to evaluate the vascular anatomy. CONTRAST:  80 mL Omnipaque 350 nonionic COMPARISON:  Chest CT July 24, 2017; chest radiograph February 19, 2020 FINDINGS: Cardiovascular: There is no demonstrable pulmonary embolus. There is no thoracic aortic aneurysm. No dissection seen. Note that the contrast bolus in the aorta is not sufficient for confident dissection assessment. There is calcification at multiple sites in visualized great vessels. There are multiple foci of aortic atherosclerosis. There is a sizable pericardial effusion. The pericardium does not appear appreciably thickened. There are foci of coronary artery calcification. The main pulmonary outflow tract measures 3.2 cm, prominent. Mediastinum/Nodes: Thyroid appears unremarkable.  There is no appreciable thoracic adenopathy. No esophageal lesions are evident. Lungs/Pleura: There is underlying centrilobular emphysematous change. There are pleural effusions bilaterally with airspace consolidation in each lower lobe with associated areas of atelectatic change. Upper Abdomen: There is upper abdominal aortic atherosclerosis. Visualized upper abdominal structures otherwise appear unremarkable. Musculoskeletal: There are foci degenerative change in the thoracic spine. No blastic or lytic bone lesions are evident. There are no chest wall lesions. Review of the MIP images confirms the above findings. IMPRESSION: 1. No demonstrable pulmonary embolus. No thoracic aortic aneurysm. No dissection seen with note that contrast bolus in the aorta is not sufficient for confident dissection assessment. There are foci of aortic atherosclerosis as well as foci of great vessel and coronary artery calcification. 2.  Sizable pericardial effusion. 3. Bilateral pleural effusions with consolidation, likely pneumonia, in the bases as well as bibasilar atelectasis. There is underlying centrilobular emphysematous change. 4. Prominence of the main pulmonary outflow tract is indicative of pulmonary arterial hypertension. 5.  No evident adenopathy. Aortic Atherosclerosis (ICD10-I70.0) and Emphysema (ICD10-J43.9). Electronically Signed   By: Lowella Grip III M.D.   On: 02/19/2020 09:46   DG Chest Port 1 View  Result Date: 02/19/2020 CLINICAL DATA:  Shortness of breath EXAM: PORTABLE CHEST 1 VIEW COMPARISON:  12/09/2019 FINDINGS: Moderate cardiomegaly with small pleural effusions and bibasilar atelectasis. No overt pulmonary edema. Calcific aortic atherosclerosis. IMPRESSION: Cardiomegaly with small pleural effusions and bibasilar atelectasis. Electronically Signed   By: Ulyses Jarred M.D.   On: 02/19/2020 04:35   ECHOCARDIOGRAM COMPLETE  Result Date: 02/19/2020    ECHOCARDIOGRAM REPORT   Patient Name:   Linda Stevens Date of Exam: 02/19/2020 Medical Rec #:  [id number]           Height:       63.0 in Accession #:    [id number]          Weight:       135.0 lb Date of Birth:  1946-09-05           BSA:          1.636 m Patient Age:    26 years            BP:           104/65 mmHg Patient Gender: F                   HR:           88 bpm. Exam Location:  Inpatient Procedure: 2D Echo, Cardiac Doppler and Color Doppler STAT ECHO  MODIFIED REPORT: This report was modified by Dorris Carnes MD on 02/19/2020 due to Complete.  Indications:     Pericardial effusion 423.9 / I31.3  History:         Patient has prior history of Echocardiogram examinations, most                  recent 12/09/2019. CHF, COPD; Risk Factors:Hypertension and                  Current Smoker. Chronic respiratory failure with hypoxia.  Sonographer:     Alvino Chapel RCS Referring Phys:  [id number] [place] Diagnosing Phys: Dorris Carnes MD IMPRESSIONS  1. Left ventricular ejection fraction, by estimation, is 55 to 60%. The left ventricle has normal function. The left ventricle has no regional wall motion abnormalities. There is moderate left ventricular hypertrophy. Left ventricular diastolic parameters are consistent with Grade I diastolic dysfunction (impaired relaxation).  2. Right ventricular systolic function is mildly reduced. The right ventricular size is normal. Mildly increased right ventricular wall thickness.  3. A large pericardial effusions surrounds heart, mor prominent in the inferior and lateral surfaces There is no significant respiratory variation in mitral inflow, ther RV/RA do not collapse; the IVC is dilated however with decreased respiratory variation.     Ther is some soft tissue attenuation within this, may reflect some pericardial fat vs consolidation. Compared to echo from April 2021 this is new. Effusioon was trivial at that time Clinical correlation indicated. . Large pericardial effusion. The pericardial effusion  is circumferential.  4. The mitral valve is grossly normal. Mild mitral valve regurgitation.  5. The aortic valve is abnormal. Aortic valve regurgitation is not visualized. Mild aortic valve sclerosis is present, with no evidence of aortic valve stenosis.  6. The inferior vena cava is dilated in size with <50% respiratory variability, suggesting right atrial pressure of 15 mmHg. FINDINGS  Left Ventricle: Left ventricular ejection fraction, by estimation, is 55 to 60%. The left ventricle has normal function. The left ventricle has no regional wall motion abnormalities. The left ventricular internal cavity size was small. There is moderate  left ventricular hypertrophy. Left ventricular diastolic parameters are consistent with Grade I diastolic dysfunction (impaired relaxation). Right Ventricle: The right ventricular size is normal. Mildly increased right ventricular wall thickness. Right ventricular systolic function is mildly reduced. Left Atrium: Left atrial size was normal in size. Right Atrium: Right atrial size was normal in size. Pericardium: A large pericardial effusions surrounds heart, mor prominent in the inferior and lateral surfaces There is no significant respiratory variation in mitral inflow, ther RV/RA do not collapse; the IVC is dilated however with decreased respiratory variation. Ther is some soft tissue attenuation within this, may reflect some pericardial fat vs consolidation. Compared to echo from April 2021 this is new. Effusioon was trivial at that time Clinical correlation indicated. A large pericardial effusion is present.  The pericardial effusion is circumferential. Mitral Valve: The mitral valve is grossly normal. Mild mitral valve regurgitation. Tricuspid Valve: The tricuspid valve is normal in structure. Tricuspid valve regurgitation is trivial. Aortic Valve: The aortic valve is abnormal. Aortic valve regurgitation is not visualized. Mild aortic valve sclerosis is present, with no  evidence of aortic valve stenosis. Pulmonic Valve: The pulmonic valve was not well visualized. Pulmonic valve regurgitation is not visualized. Aorta: The aortic root and ascending aorta are structurally normal, with no evidence of dilitation. Venous: The inferior vena cava is dilated in size with less than  50% respiratory variability, suggesting right atrial pressure of 15 mmHg. IAS/Shunts: No atrial level shunt detected by color flow Doppler.  LEFT VENTRICLE PLAX 2D LVIDd:         3.60 cm  Diastology LVIDs:         2.50 cm  LV e' lateral:   5.22 cm/s LV PW:         1.00 cm  LV E/e' lateral: 16.3 LV IVS:        0.90 cm  LV e' medial:    8.05 cm/s LVOT diam:     1.80 cm  LV E/e' medial:  10.6 LV SV:         56 LV SV Index:   35 LVOT Area:     2.54 cm  RIGHT VENTRICLE RV S prime:     11.00 cm/s TAPSE (M-mode): 1.6 cm LEFT ATRIUM             Index       RIGHT ATRIUM           Index LA Vol (A2C):   54.2 ml 33.13 ml/m RA Area:     15.00 cm LA Vol (A4C):   45.3 ml 27.69 ml/m RA Volume:   35.30 ml  21.57 ml/m LA Biplane Vol: 51.6 ml 31.54 ml/m  AORTIC VALVE LVOT Vmax:   107.00 cm/s LVOT Vmean:  63.200 cm/s LVOT VTI:    0.222 m MITRAL VALVE MV Area (PHT): 3.77 cm    SHUNTS MV Decel Time: 201 msec    Systemic VTI:  0.22 m MV E velocity: 85.20 cm/s  Systemic Diam: 1.80 cm MV A velocity: 93.60 cm/s MV E/A ratio:  0.91 Dorris Carnes MD Electronically signed by Dorris Carnes MD Signature Date/Time: 02/19/2020/1:33:51 PM    Final (Updated)     Cardiac Studies   RHC 12/18 RA = 5 RV = 56/9 PA = 52/17 (33) PCW = 10 Fick cardiac output/index = 7.5/4.5 PVR = 3.5 WU Ao sat = 96% PA sat = 73%, 72% High SVC sat = 76%  VQ scan low prob. CT chest mild COPD. Moderate pericardial effusion. 3v CAD. + cirrhosis. Hepatitis serologies negative.   PFTs (FEV1 1.23 (52%) with FVC 2.01 (65%) and DLCO 51% in 7/15) PFT's 10/08/2015 FEV1 0.91 (40 %) ratio 56%DLCO 38 % corrects to 63 % for alv volume  PFTs 12/06/15 FEV1 1.0  (44%) FVC 1.74 (58%0 DLCO 28%  R/LHC 10/08/15 with normal coronaries, normal CO, and sever PAH with 13.6 WU.   Findings: Ao = 108/68 (86) LV = 102/10/11 RA = 9 RV = 97/8/12 PA = 104/49 (72) PCW = 18 Fick cardiac output/index = 3.96/2.34 PVR = 13.6 WU FA sat = 93% PA sat = 66%, 69%  RHC 5/18 RA = 2 RV = 48/4 PA = 45/17 (30) PCW = 8 Fick cardiac output/index = 4.9/3.0 PVR = 4.4 WU Ao sat = 94% PA sat = 68%, 70%    Assessment & Plan    74 year old female with COPD pulmonary hypertension with large pericardial effusion on recent echocardiogram in the setting of pneumonia.  Pericardial effusion -Personally reviewed images.  Previous echo trivial.  2017 echo moderate effusion.  Currently large. IVC is dilated however there is no signs of RV collapse or other intrinsic signs of tamponade.  Effusion approximately 2 cm.  Agree with Dr. Harrington Challenger, would continue with conservative management.  Hopefully after pneumonia is treated and inflammation is decreased, pericardial effusion will follow.  Obviously, if  hypotension were to occur or other worrisome signals, pericardial tap or window may be necessary. -In 2017 and her past medical history she also had a moderate sized effusion without tamponade. -Recheck echocardiogram tomorrow. -Creatinine slightly increased.  Monitor. -Could this possibly be an autoimmune phenomenon.  Dr. Haroldine Laws has ordered rheumatologic work-up.  Previously this was unremarkable.  Getting high-dose steroids should help with discomfort.  Pulmonary hypertension secondary -Pulmonary artery systolic pressures estimated 87 mmHg, contributions from COPD.  Small atrial fibrillation -EMS telemetry strip showed atrial fibrillation with rapid ventricular response.  Diltiazem was administered and sinus rhythm secondary.  Avoid anticoagulation at this time given pericardial effusion.  Severe COPD -Dr. Melvyn Novas has been following.  Azithromycin and steroids were utilized  earlier this week.  Cirrhosis signal noted on CT -Likely from elevated right-sided cardiac pressures  Dr. Haroldine Laws with heart failure team also seeing.      For questions or updates, please contact [place] Please consult [email or web address] for contact info under        Signed, Candee Furbish, MD  02/20/2020, 9:03 AM

## 2020-02-20 NOTE — Progress Notes (Signed)
02/20/20 1443  Assess: MEWS Score  BP (!) 103/57  Pulse Rate 80  ECG Heart Rate 83  Resp 18  SpO2 92 %  O2 Device Nasal Cannula  O2 Flow Rate (L/min) 4 L/min  Assess: MEWS Score  MEWS Temp 0  MEWS Systolic 0  MEWS Pulse 0  MEWS RR 0  MEWS LOC 0  MEWS Score 0  MEWS Score Color Linda Stevens

## 2020-02-20 NOTE — Progress Notes (Signed)
   02/20/20 1130  Assess: MEWS Score  Pulse Rate (!) 153  ECG Heart Rate (!) 140  Resp 15  SpO2 92 %  Assess: MEWS Score  MEWS Temp 0  MEWS Systolic 0  MEWS Pulse 3  MEWS RR 0  MEWS LOC 0  MEWS Score 3  MEWS Score Color Yellow  Notify: Provider  Provider Name/Title Cloggs, Amy, PA  Date Provider Notified 02/20/20  Time Provider Notified 1120  Notification Type Page  Notification Reason Change in status  Response See new orders  Date of Provider Response 02/20/20  Time of Provider Response 1130  Notify: Rapid Response  Name of Rapid Response RN Notified Morghan RN  Date Rapid Response Notified 02/20/20  Time Rapid Response Notified [id number]

## 2020-02-20 NOTE — Progress Notes (Signed)
PROGRESS NOTE  Linda Stevens UKG:[id number] DOB: 09-14-1945   PCP: [name], MD  Patient is from: Home.  DOA: 02/19/2020 LOS: 1  Brief Narrative / Interim history: 74 year old female with history of COPD/chronic RF on 3 L, diastolic CHF, pulmonary HTN, HTN and HLD presenting with progressive shortness of breath for 1 week.  Tried with a course of Z-Pak and a steroid taper by her pulmonologist on 6/9 but continues to have progressive shortness of breath.  In ED, slightly hypotensive.  Required 6 L by [place].  WBC 11.4.  AST 90.  Total bili 1.9.  Lactic acid 2.8.  BNP 164.  Trop negative x2.  CXR concerning for cardiomegaly with small pleural effusion and by basilar atelectasis.  CTA chest showed sizable pericardial effusion, bilateral pleural effusions and consolidation concerning for pneumonia and pulmonary HTN.  Cultures drawn.  Received IV Solu-Medrol, morphine, Rocephin and azithromycin,and admitted for acute on chronic respiratory failure due to pneumonia, pleural effusions and pericardial effusion.  Cardiology consulted for pericardial effusion.  Echo obtained and revealed dilated and fixed IVC although no other features of tamponade.  The next day, blood cultures with GPC's in clusters in 4/4.  BICD with coag negative MR staph.  Vancomycin added.  Azithromycin discontinued.  She also went into A. fib with RVR with hypotension.  Started on amiodarone drip.  Advanced heart failure team involved as well.  Subjective: Seen and examined earlier this morning.  She heart left-sided chest pain that has resolved with pain medication.  She also reports nausea but no emesis other than some spits up.  Denies abdominal pain.  Breathing improved.  Objective: Vitals:   02/20/20 1150 02/20/20 1200 02/20/20 1215 02/20/20 1246  BP: 104/85 (!) 82/66 (!) 95/59   Pulse: (!) 143 (!) 161 (!) 126 83  Resp: _0 Temp:      TempSrc:      SpO2:  91% 93% 95%  Weight:      Height:       No  intake or output data in the 24 hours ending 02/20/20 1322 Filed Weights   02/19/20 0404 02/19/20 2055  Weight: 61.2 kg 63 kg    Examination:  GENERAL: No apparent distress.  Nontoxic. HEENT: MMM.  Vision and hearing grossly intact.  NECK: Supple.  Prominent JVD even while sitting up. RESP: On 3.5 L.  No IWOB.  Fair aeration bilaterally.  Rhonchi bilaterally. CVS:  RRR. Heart sounds normal.  ABD/GI/GU: BS+. Abd soft, NTND.  MSK/EXT:  Moves extremities. No apparent deformity. No edema.  SKIN: no apparent skin lesion or wound NEURO: Awake, alert and oriented appropriately.  No apparent focal neuro deficit. PSYCH: Calm. Normal affect.  Procedures:  None  Microbiology summarized: COVID-19 PCR negative. MRSA PCR negative. Blood cultures with GPC's in clusters in 4/4. BICD with coag negative methicillin-resistant staph  Assessment & Plan: Coag negative MR staph bacteremia-blood cultures with GPC in clusters in 1/4. BICD with coagulase-negative methicillin-resistant staph -Vancomycin added -Continue ceftriaxone given possible pneumonia -Discontinue azithromycin-recently completed course -Will involve ID.  May need repeat blood culture on 6/15, and possibly TEE  Community-acquired pneumonia: CTA concerning for bibasilar consolidation with pleural effusion. -Antibiotics as above -Incentive spirometry and flutter valve  Acute on chronic respiratory failure with hypoxia: Multifactorial including infectious process, pericardiac and pleural effusion, pulmonary hypertension, underlying COPD and A. fib with RVR.  On 3 L at baseline. -Wean oxygen as able-minimum oxygen to keep saturation above 90% -Treat potential  etiologies  Pericardial effusion: Reported as sizable.  She has significant JVD with hypotension although hypotension could be due to A. Fib.  Echo with dilated and fixated IVC but no other features of tamponade. -Cardiology managing. -Plan for repeat echocardiogram on  6/15  Pleural effusion: Looks a small on review of CTA chest -Continue antibiotics as above  Pulmonary hypertension: Felt to be secondary in the setting of underlying lung disease -Advanced heart failure team following. -On tadalafil, ambrisentan  A. fib with RVR: Noted to have A. fib on EKG obtained by EMS.  Went into RVR on 6/14. -Started on amiodarone drip in the setting of soft blood pressures -Defer anticoagulation to cardiology. -Follow autoimmune labs  AKI: Baseline Cr 0.8-0.9> 1.01 (admit)> 1.45.  Likely hemodynamically mediated. -Continue monitoring Avoid nephrotoxic meds  Acute on chronic diastolic CHF  Essential hypertension  Elevated liver enzymes/hyperbilirubinemia  Acute on chronic COPD -IV Solu-Medrol 60 mg daily, Trelegy Ellipta  Liver cirrhosis-due to pulmonary hypertension/heart failure?  Elevated AST/hyperbilirubinemia-likely due to bacteremia and possible congestive etiology -Continue trending  Debility/physical deconditioning -PT/OT when medically stable  Goal of care-significant cardiopulmonary comorbidities as above.  Prognosis is not great.  She is a still full code.  This would probably pose more harm than benefit. -Will involve palliative care              DVT prophylaxis:  enoxaparin (LOVENOX) injection 40 mg Start: 02/19/20 1800  Code Status: Full code Family Communication: Updated patient's husband at bedside Status is: Inpatient  Remains inpatient appropriate because:Hemodynamically unstable, Ongoing diagnostic testing needed not appropriate for outpatient work up, IV treatments appropriate due to intensity of illness or inability to take PO and Inpatient level of care appropriate due to severity of illness   Dispo: The patient is from: Home              Anticipated d/c is to: To be determined              Anticipated d/c date is: > 3 days              Patient currently is not medically stable to d/c.       Consultants:   Cardiology Advanced heart failure team   Sch Meds:  Scheduled Meds: . acidophilus  1 capsule Oral Daily  . ambrisentan  10 mg Oral Daily  . aspirin EC  81 mg Oral Daily  . enoxaparin (LOVENOX) injection  40 mg Subcutaneous Q24H  . fluticasone  2 spray Each Nare Daily  . umeclidinium bromide  2 puff Inhalation Daily   And  . fluticasone furoate-vilanterol  2 puff Inhalation Daily  . guaiFENesin  600 mg Oral BID  . [START ON 02/21/2020] methylPREDNISolone (SOLU-MEDROL) injection  60 mg Intravenous Q24H  . tadalafil (PAH)  40 mg Oral Daily   Continuous Infusions: . amiodarone 60 mg/hr (02/20/20 1134)   Followed by  . amiodarone    . cefTRIAXone (ROCEPHIN)  IV 2 g (02/20/20 1012)  . [START ON 02/21/2020] vancomycin     PRN Meds:.acetaminophen **OR** acetaminophen, morphine injection, ondansetron **OR** ondansetron (ZOFRAN) IV, promethazine, traMADol  Antimicrobials: Anti-infectives (From admission, onward)   Start     Dose/Rate Route Frequency Ordered Stop   02/21/20 0600  vancomycin (VANCOCIN) IVPB 1000 mg/200 mL premix     Discontinue     1,000 mg 200 mL/hr over 60 Minutes Intravenous Every 24 hours 02/20/20 0621     02/20/20 1000  cefTRIAXone (ROCEPHIN) 2 g in  sodium chloride 0.9 % 100 mL IVPB  Status:  Discontinued        2 g 200 mL/hr over 30 Minutes Intravenous Every 24 hours 02/19/20 1342 02/20/20 0616   02/20/20 1000  azithromycin (ZITHROMAX) 500 mg in sodium chloride 0.9 % 250 mL IVPB  Status:  Discontinued        500 mg 250 mL/hr over 60 Minutes Intravenous Every 24 hours 02/19/20 1342 02/20/20 1019   02/20/20 1000  cefTRIAXone (ROCEPHIN) 2 g in sodium chloride 0.9 % 100 mL IVPB     Discontinue     2 g 200 mL/hr over 30 Minutes Intravenous Every 24 hours 02/20/20 0913     02/20/20 0645  vancomycin (VANCOREADY) IVPB 1250 mg/250 mL        1,250 mg 166.7 mL/hr over 90 Minutes Intravenous  Once 02/20/20 0621 02/20/20 0815   02/19/20 1015  cefTRIAXone (ROCEPHIN) 1 g in  sodium chloride 0.9 % 100 mL IVPB        1 g 200 mL/hr over 30 Minutes Intravenous  Once 02/19/20 1008 02/19/20 1151   02/19/20 1015  azithromycin (ZITHROMAX) 500 mg in sodium chloride 0.9 % 250 mL IVPB        500 mg 250 mL/hr over 60 Minutes Intravenous  Once 02/19/20 1008 02/19/20 1324       I have personally reviewed the following labs and images: CBC: Recent Labs  Lab 02/19/20 0405 02/20/20 0240  WBC 11.4* 17.9*  NEUTROABS 9.8*  --   HGB 12.0 11.0*  HCT 38.5 35.6*  MCV 101.0* 100.3*  PLT 309 251   BMP &GFR Recent Labs  Lab 02/19/20 0405 02/19/20 1039 02/20/20 0240  NA 138  --  136  K 4.3  --  3.7  CL 92*  --  87*  CO2 30  --  37*  GLUCOSE 205*  --  136*  BUN 13  --  25*  CREATININE 1.01*  --  1.45*  CALCIUM 7.9*  --  8.3*  MG  --  1.9  --    Estimated Creatinine Clearance: 31.8 mL/min (A) (by C-G formula based on SCr of 1.45 mg/dL (H)). Liver & Pancreas: Recent Labs  Lab 02/19/20 0405  AST 90*  ALT 34  ALKPHOS 130*  BILITOT 1.9*  PROT 6.1*  ALBUMIN 3.1*   No results for input(s): LIPASE, AMYLASE in the last 168 hours. No results for input(s): AMMONIA in the last 168 hours. Diabetic: No results for input(s): HGBA1C in the last 72 hours. No results for input(s): GLUCAP in the last 168 hours. Cardiac Enzymes: No results for input(s): CKTOTAL, CKMB, CKMBINDEX, TROPONINI in the last 168 hours. No results for input(s): PROBNP in the last 8760 hours. Coagulation Profile: No results for input(s): INR, PROTIME in the last 168 hours. Thyroid Function Tests: Recent Labs    02/19/20 1039  TSH 2.577   Lipid Profile: No results for input(s): CHOL, HDL, LDLCALC, TRIG, CHOLHDL, LDLDIRECT in the last 72 hours. Anemia Panel: No results for input(s): VITAMINB12, FOLATE, FERRITIN, TIBC, IRON, RETICCTPCT in the last 72 hours. Urine analysis:    Component Value Date/Time   COLORURINE YELLOW 07/07/2017 1625   APPEARANCEUR CLEAR 07/07/2017 1625   LABSPEC 1.025  07/07/2017 1625   PHURINE 6.0 07/07/2017 1625   GLUCOSEU NEGATIVE 07/07/2017 1625   HGBUR NEGATIVE 07/07/2017 1625   HGBUR negative 12/22/2007 1139   BILIRUBINUR SMALL (A) 07/07/2017 1625   KETONESUR NEGATIVE 07/07/2017 1625   UROBILINOGEN 0.2 07/07/2017  [place] 07/07/2017 [place] 07/07/2017 1625   Sepsis Labs: Invalid input(s): PROCALCITONIN, [place]  Microbiology: Recent Results (from the past 240 hour(s))  SARS Coronavirus 2 by RT PCR (hospital order, performed in [facility] hospital lab) Nasopharyngeal Nasopharyngeal Swab     Status: None   Collection Time: 02/19/20  5:27 AM   Specimen: Nasopharyngeal Swab  Result Value Ref Range Status   SARS Coronavirus 2 NEGATIVE NEGATIVE Final    Comment: (NOTE) SARS-CoV-2 target nucleic acids are NOT DETECTED.  The SARS-CoV-2 RNA is generally detectable in upper and lower respiratory specimens during the acute phase of infection. The lowest concentration of SARS-CoV-2 viral copies this assay can detect is 250 copies / mL. A negative result does not preclude SARS-CoV-2 infection and should not be used as the sole basis for treatment or other patient management decisions.  A negative result may occur with improper specimen collection / handling, submission of specimen other than nasopharyngeal swab, presence of viral mutation(s) within the areas targeted by this assay, and inadequate number of viral copies (<250 copies / mL). A negative result must be combined with clinical observations, patient history, and epidemiological information.  Fact Sheet for Patients:   StrictlyIdeas.no  Fact Sheet for Healthcare Providers: BankingDealers.co.za  This test is not yet approved or  cleared by the [place] FDA and has been authorized for detection and/or diagnosis of SARS-CoV-2 by FDA under an Emergency Use Authorization (EUA).  This EUA will  remain in effect (meaning this test can be used) for the duration of the COVID-19 declaration under Section 564(b)(1) of the Act, 21 U.S.C. section 360bbb-3(b)(1), unless the authorization is terminated or revoked sooner.  Performed at [facility], [place] [address] [place], [place] [address]   Culture, blood (routine x 2)     Status: None (Preliminary result)   Collection Time: 02/19/20 10:36 AM   Specimen: BLOOD RIGHT HAND  Result Value Ref Range Status   Specimen Description BLOOD RIGHT HAND  Final   Special Requests   Final    BOTTLES DRAWN AEROBIC AND ANAEROBIC Blood Culture adequate volume   Culture  Setup Time   Final    GRAM POSITIVE COCCI IN CLUSTERS IN BOTH AEROBIC AND ANAEROBIC BOTTLES CRITICAL VALUE NOTED.  VALUE IS CONSISTENT WITH PREVIOUSLY REPORTED AND CALLED VALUE. Performed at [facility], [place] [address] [place], [place] [address]    Culture GRAM POSITIVE COCCI  Final   Report Status PENDING  Incomplete  Culture, blood (routine x 2)     Status: None (Preliminary result)   Collection Time: 02/19/20 10:48 AM   Specimen: BLOOD  Result Value Ref Range Status   Specimen Description BLOOD RIGHT ANTECUBITAL  Final   Special Requests   Final    BOTTLES DRAWN AEROBIC AND ANAEROBIC Blood Culture adequate volume   Culture  Setup Time   Final    GRAM POSITIVE COCCI IN CLUSTERS IN BOTH AEROBIC AND ANAEROBIC BOTTLES CRITICAL RESULT CALLED TO, READ BACK BY AND VERIFIED WITH: PHRMD V BRYK _0  02/20/20 BY S GEZAHEGN Performed at [facility], [place] [address] [place], [place] [address]    Culture [facility] POSITIVE COCCI  Final   Report Status PENDING  Incomplete  Blood Culture ID Panel (Reflexed)     Status: Abnormal   Collection Time: 02/19/20 10:48 AM  Result Value Ref Range Status   Enterococcus species NOT DETECTED NOT DETECTED Final   Listeria  monocytogenes NOT DETECTED NOT DETECTED Final   Staphylococcus species DETECTED (A) NOT DETECTED Final     Comment: Methicillin (oxacillin) resistant coagulase negative staphylococcus. Possible blood culture contaminant (unless isolated from more than one blood culture draw or clinical case suggests pathogenicity). No antibiotic treatment is indicated for blood  culture contaminants. CRITICAL RESULT CALLED TO, READ BACK BY AND VERIFIED WITH: PHRMD V BRYK _0  02/20/20 BY S GEZAHEGN    Staphylococcus aureus (BCID) NOT DETECTED NOT DETECTED Final   Methicillin resistance DETECTED (A) NOT DETECTED Final    Comment: CRITICAL RESULT CALLED TO, READ BACK BY AND VERIFIED WITH: PHRMD V BRYK _1  02/20/20 BY S GEZAHEGN    Streptococcus species NOT DETECTED NOT DETECTED Final   Streptococcus agalactiae NOT DETECTED NOT DETECTED Final   Streptococcus pneumoniae NOT DETECTED NOT DETECTED Final   Streptococcus pyogenes NOT DETECTED NOT DETECTED Final   Acinetobacter baumannii NOT DETECTED NOT DETECTED Final   Enterobacteriaceae species NOT DETECTED NOT DETECTED Final   Enterobacter cloacae complex NOT DETECTED NOT DETECTED Final   Escherichia coli NOT DETECTED NOT DETECTED Final   Klebsiella oxytoca NOT DETECTED NOT DETECTED Final   Klebsiella pneumoniae NOT DETECTED NOT DETECTED Final   Proteus species NOT DETECTED NOT DETECTED Final   Serratia marcescens NOT DETECTED NOT DETECTED Final   Haemophilus influenzae NOT DETECTED NOT DETECTED Final   Neisseria meningitidis NOT DETECTED NOT DETECTED Final   Pseudomonas aeruginosa NOT DETECTED NOT DETECTED Final   Candida albicans NOT DETECTED NOT DETECTED Final   Candida glabrata NOT DETECTED NOT DETECTED Final   Candida krusei NOT DETECTED NOT DETECTED Final   Candida parapsilosis NOT DETECTED NOT DETECTED Final   Candida tropicalis NOT DETECTED NOT DETECTED Final    Comment: Performed at [facility], [place]. [address], [place], [place] [address]  MRSA PCR Screening     Status: None   Collection Time: 02/19/20  8:59 PM   Specimen: Nasopharyngeal   Result Value Ref Range Status   MRSA by PCR NEGATIVE NEGATIVE Final    Comment:        The GeneXpert MRSA Assay (FDA approved for NASAL specimens only), is one component of a comprehensive MRSA colonization surveillance program. It is not intended to diagnose MRSA infection nor to guide or monitor treatment for MRSA infections. Performed at [facility], [place] [address], [place], [place] [address]     Radiology Studies: No results found.  45 minutes with more than 50% spent in reviewing records, counseling patient/family and coordinating care.   Mayah Urquidi T. [place]  If 7PM-7AM, please contact night-coverage [email or web address] Password [email or web address] 02/20/2020, 1:22 PM

## 2020-02-20 NOTE — Progress Notes (Signed)
PHARMACY - PHYSICIAN COMMUNICATION CRITICAL VALUE ALERT - BLOOD CULTURE IDENTIFICATION (BCID)  Linda Stevens is an 74 y.o. female who presented to [facility] on 02/19/2020 with a chief complaint of SOB, CP, and N/V.  Assessment:  Started on ABX for CAP, now w/ coag-neg staph with methicillin resistance growing in 2/4 blood cx bottles; of note WBC has increased from 11 to 17 overnight, remains afebrile.  Name of physician (or Provider) Contacted: TOpyd  Current antibiotics: azithromycin and ceftriaxone  Changes to prescribed antibiotics recommended:  Will change ceftriaxone to vancomycin given resistance and worsening leukocystosis. Vancomycin 1243m x1 then 10035mIV Q24H; goal trough 15-20.  Results for orders placed or performed during the hospital encounter of 02/19/20  Blood Culture ID Panel (Reflexed) (Collected: 02/19/2020 10:48 AM)  Result Value Ref Range   Enterococcus species NOT DETECTED NOT DETECTED   Listeria monocytogenes NOT DETECTED NOT DETECTED   Staphylococcus species DETECTED (A) NOT DETECTED   Staphylococcus aureus (BCID) NOT DETECTED NOT DETECTED   Methicillin resistance DETECTED (A) NOT DETECTED   Streptococcus species NOT DETECTED NOT DETECTED   Streptococcus agalactiae NOT DETECTED NOT DETECTED   Streptococcus pneumoniae NOT DETECTED NOT DETECTED   Streptococcus pyogenes NOT DETECTED NOT DETECTED   Acinetobacter baumannii NOT DETECTED NOT DETECTED   Enterobacteriaceae species NOT DETECTED NOT DETECTED   Enterobacter cloacae complex NOT DETECTED NOT DETECTED   Escherichia coli NOT DETECTED NOT DETECTED   Klebsiella oxytoca NOT DETECTED NOT DETECTED   Klebsiella pneumoniae NOT DETECTED NOT DETECTED   Proteus species NOT DETECTED NOT DETECTED   Serratia marcescens NOT DETECTED NOT DETECTED   Haemophilus influenzae NOT DETECTED NOT DETECTED   Neisseria meningitidis NOT DETECTED NOT DETECTED   Pseudomonas aeruginosa NOT DETECTED NOT DETECTED   Candida  albicans NOT DETECTED NOT DETECTED   Candida glabrata NOT DETECTED NOT DETECTED   Candida krusei NOT DETECTED NOT DETECTED   Candida parapsilosis NOT DETECTED NOT DETECTED   Candida tropicalis NOT DETECTED NOT DETECTED    VeWynona NeatPharmD, BCPS  02/20/2020  6:16 AM

## 2020-02-20 NOTE — Progress Notes (Signed)
Called by nursing staff.   In A fib RVR . EKG confirmed.  Will use amio drip while in the hospital.   Start now.   Discussed with Dr Haroldine Laws.   Darrick Grinder NP-C  11:27 AM'

## 2020-02-20 NOTE — Progress Notes (Signed)
02/20/20 1438  Assess: MEWS Score  Pulse Rate 80  ECG Heart Rate 81  Resp 18  SpO2 91 %  O2 Device Nasal Cannula  O2 Flow Rate (L/min) 4 L/min  Assess: MEWS Score  MEWS Temp 0  MEWS Systolic 1  MEWS Pulse 0  MEWS RR 0  MEWS LOC 0  MEWS Score 1  MEWS Score Color Green  Document  Patient Outcome Other (Comment) (Converted back to NSR)  Progress note created (see row info) Yes

## 2020-02-20 NOTE — Consult Note (Addendum)
Advanced Heart Failure Team Consult Note   Primary Physician: [name], MD PCP-HF Cardiologist:  [name], MD  Reason for Consultation: Heart Failure/PAH  HPI:    MAHOGONY GILCHREST is seen today for evaluation of heart failure at the request of Dr Harrington Challenger.   Ms Mariann Laster is a 74 year old with history of COPD, pulmonary hth, asthma, former smoker, chronic hypoxic respiratory failure on 3 liters, and chronic diastolic heart failure.   Admitted January 2017 secondary to progressive DOE, hypoxia, and lower ext edema and found to have severe PAH. R/LHC 10/08/15 with normal coronaries, normal CO, and severe PAH PAP 104/49 with 13.6 WU. PAH well out of proportion to left-sided filling pressures and COPD. Started revatio 20 TID with consideration for macitentan as outpatient. PFTs 10/05/15  FEV1 0.91 (40%)FVC 1.64 (55%), DLCO 8.80 (38%). Auto-immune and infectious serologies negative. VQ negative. Discharge weight 148 pounds.   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 Has been drained twice. Underwent first thoracentesis on 11/7.  Transudative. CT scan 07/24/17 showed large R effusion and small pericardial effusion. +COPD.  Underwent repeat thoracentesis on 07/29/17 with 310cc out.  Cytology negative. F/u CXR 11/21 showed small bilateral effusions.  Referred to Dr. Roxan Hockey for possible Pleurex versus VATS in 12/18 but she did not want to proceed. Ab u/s with cirrhotic features.  RF markedly ++ but other serology negative. Saw Dr. Amil Amen who said she did not have RA but was diagnosed with polyarthralgia/OA.   Saw Dr Melvyn Novas last week and was started on z pac + steroids. She has felt worse since that time.   Presented to ED via EMS with shortness of breath and left Admitted with increased dyspnea and left pleurtic chest pain. In the ED she was given solumedrol, rochephin, and  Azithromycin. CTA showed pericardial effusion, no  PE, no dissection, bilateral effusion with consolidation sleekly pneumonia. ECHO completed and showed preserved LVEF and large pericardial effusion.    Pertinent admission labs included: Lactic Acid 2.8 >3.4, HS Trop 4>10 , Sars2 negative, WBC 11, Hgb 12, BNP 164, AST 90, Alk Phos 130, and  Total bili 1.9      Blood CX: NGTD  Echo 02/2020 EF 55-60% Large pericardial effusion. Grade 1 DD Echo 4/19 EF 65% RV normal. No effusion Personally reviewed Echo 5/18 EF 60-65% RV completely normal. No TR to measure RVSP. No effusion  Echo 3/17 EF 60-65% septum still flattened. RV size improved moderate HK. RVSP ~80. IVC small. Pericardial effusion essentially resolved   PAH regimen: 1. Adcirca 40 2. Letairis 10 (Switched to [name] as it was thought that sinusitis might be due to macitentan.) Review of Systems: [y] = yes, _0  = no   . General: Weight gain _1 ; Weight loss _2 ; Anorexia _3 ; Fatigue [Y ]; Fever _4 ; Chills [Y ]; Weakness [Y ]  . Cardiac: Chest pain/pressure _5 ; Resting SOB _6 ; Exertional SOB [Y ]; Orthopnea [ Y]; Pedal Edema _7 ; Palpitations _8 ; Syncope _9 ; Presyncope _10 ; Paroxysmal nocturnal dyspnea_11   . Pulmonary: Cough _12 ; Wheezing_13 ; Hemoptysis_14 ; Sputum _15 ; Snoring _16   . GI: Vomiting_17 ; Dysphagia_18 ; Melena_19 ; Hematochezia _20 ; Heartburn_21 ; Abdominal pain _22 ; Constipation _23 ; Diarrhea _24 ; BRBPR _25   . GU: Hematuria_26 ; Dysuria _27 ; Nocturia_28   . Vascular: Pain in legs with walking _29 ;  Pain in feet with lying flat _0 ; Non-healing sores _1 ; Stroke _2 ; TIA _3 ; Slurred speech _4 ;  . Neuro: Headaches_5 ; Vertigo_6 ; Seizures_7 ; Paresthesias_8 ;Blurred vision _9 ; Diplopia _10 ; Vision changes _11   . Ortho/Skin: Arthritis _12 ; Joint pain [ Y]; Muscle pain _13 ; Joint swelling _14 ; Back Pain _15 ; Rash _16   . Psych: Depression_17 ; Anxiety_18   . Heme: Bleeding problems _19 ; Clotting disorders _20 ; Anemia _21   . Endocrine: Diabetes _22 ; Thyroid dysfunction_23   Home  Medications Prior to Admission medications   Medication Sig Start Date End Date Taking? Authorizing Provider  acetaminophen (TYLENOL) 325 MG tablet Take 325 mg by mouth at bedtime as needed for moderate pain or headache.    Yes [provider]  albuterol (PROVENTIL HFA;VENTOLIN HFA) 108 (90 Base) MCG/ACT inhaler Inhale 2 puffs every 6 (six) hours as needed into the lungs for wheezing or shortness of breath. 07/17/17  Yes Tanda Rockers, MD  ambrisentan (LETAIRIS) 10 MG tablet TAKE 1 TABLET (10MG) BY MOUTH DAILY. DO NOT HANDLE IF PREGNANT. DO NOTSPLIT, CRUSH, OR CHEW. AVOID INHALATION AND CONTACT WITH SKIN OR EYES . CALL [phone] TO REFILL. Patient taking differently: Take 10 mg by mouth daily. [name] CALL [phone] TO REFILL. 05/12/19  Yes Cyerra Yim, Shaune Pascal, MD  aspirin EC 81 MG EC tablet Take 1 tablet (81 mg total) by mouth daily. 10/09/15  Yes Debbe Odea, MD  azithromycin (ZITHROMAX) 250 MG tablet Take 2 on day one then 1 daily x 4 days Patient taking differently: Take 250-500 mg by mouth See admin instructions. Take 2 tablets on day 1 then take 1 tablet daily for 4 days 02/13/20  Yes Tanda Rockers, MD  clotrimazole (MYCELEX) 10 MG troche Take 1 tablet (10 mg total) by mouth 5 (five) times daily. 02/13/20  Yes Tanda Rockers, MD  fluticasone (FLONASE) 50 MCG/ACT nasal spray USE 2 SPRAYS IN EAXH NOSTRIL ONCE A DAY Patient taking differently: Place 2 sprays into both nostrils daily.  08/29/19  Yes Tanda Rockers, MD  furosemide (LASIX) 40 MG tablet TAKE 1 TABLET BY MOUTH TWICE A DAY Patient taking differently: Take 40 mg by mouth 2 (two) times daily.  12/12/19  Yes Joban Colledge, Shaune Pascal, MD  ipratropium (ATROVENT) 0.06 % nasal spray PLACE 2 SPRAYS INTO BOTH NOSTRILS 2 (TWO) TIMES DAILY 01/24/20  Yes Ahyan Kreeger, Shaune Pascal, MD  Polyethyl Glycol-Propyl Glycol (SYSTANE OP) Apply 1-2 drops to eye daily as needed (dry eyes).   Yes [provider]  predniSONE (DELTASONE) 10 MG tablet Take   4 each am x 2 days,   2 each am x 2 days,  1 each am x 2 days and stop Patient taking differently: Take 10-40 mg by mouth See admin instructions. Take 4 tablets every morning for 2 days then take 2 tablets every morning for 2 days then take 1 tablet daily for 2 days then stop 02/13/20  Yes Tanda Rockers, MD  spironolactone (ALDACTONE) 25 MG tablet TAKE 1/2 TABLET BY MOUTH EVERY DAY Patient taking differently: Take 12.5 mg by mouth daily.  10/17/19  Yes Lauranne Beyersdorf, Shaune Pascal, MD  tadalafil, PAH, (ADCIRCA) 20 MG tablet Take 2 tablets (40 mg total) by mouth daily. 05/12/19  Yes Chayla Shands, Shaune Pascal, MD  TRELEGY ELLIPTA 100-62.5-25 MCG/INH AEPB INHALE 2 PUFFS INTO THE LUNGS EVERY DAY Patient taking differently: Inhale 2 puffs into the lungs daily.  11/17/19  Yes Tanda Rockers, MD  OXYGEN 2 to 2lpm 24/7    [provider]    Past Medical History: Past Medical History:  Diagnosis Date  . Acute respiratory failure with hypoxia and hypercapnea 02/26/2014  . Allergic rhinitis due to pollen   . Allergy   . Arthritis   . Asthma   . CHF (congestive heart failure) ([place])   . COPD (chronic obstructive pulmonary disease) ([place])   . Diastolic dysfunction    a. 02/2014 Echo: EF 65-70%, Gr 1 DD, RVH;  b. 09/2015 Echo: EF 55%, Gr 1 DD.  [name] Emphysema of lung ([place])   . H/O seasonal allergies   . History of chicken pox   . History of tobacco abuse    a. Quit 2015.  [name] History of UTI   . Hypertension   . Hypertensive heart disease   . Lower extremity edema   . Pericardial effusion    a. 09/2015 Echo: Mod eff w/o tamponade.  . Right heart failure with reduced right ventricular function ([place])    a. 09/2015 Echo: EF 55%, Gr 1 DD, D shaped IV septum, sev dil RV with mod reduced fxn, mod dil RA, RV-RA grad 70mHg, PASP 883mg, mod pericard eff w/o tamponade.  . Severe Pulmonary Hypertension    a. 09/2015 Echo: PASP 8733m.    Past Surgical History: Past Surgical History:  Procedure Laterality Date  . CARDIAC  CATHETERIZATION N/A 10/08/2015   Procedure: Right/Left Heart Cath and Coronary Angiography;  Surgeon: DanJolaine ArtistD;  Location: [facility];  Service: Cardiovascular;  Laterality: N/A;  . CHOLECYSTECTOMY     Open  . RIGHT HEART CATH N/A 01/26/2017   Procedure: Right Heart Cath;  Surgeon: BenJolaine ArtistD;  Location: [facility];  Service: Cardiovascular;  Laterality: N/A;  . RIGHT HEART CATH N/A 08/12/2017   Procedure: RIGHT HEART CATH;  Surgeon: BenJolaine ArtistD;  Location: [facility];  Service: Cardiovascular;  Laterality: N/A;    Family History: Family History  Problem Relation Age of Onset  . Glaucoma Brother   . Vascular Disease Father        Died of complications from surgery  . Heart attack Father   . Asthma Mother        Died of asthma attack  . Dementia Mother   . Hyperlipidemia Mother   . Hypertension Mother     Social History: Social History   Socioeconomic History  . Marital status: Married    Spouse name: Not on file  . Number of children: Not on file  . Years of education: 2  . Highest education level: Not on file  Occupational History  . Occupation: retired teaPharmacist, hospitalobacco Use  . Smoking status: Former Smoker    Packs/day: 1.00    Years: 40.00    Pack years: 40.00    Types: Cigarettes    Quit date: 02/22/2014    Years since quitting: 5.9  . Smokeless tobacco: Never Used  Vaping Use  . Vaping Use: Never used  Substance and Sexual Activity  . Alcohol use: No  . Drug use: No  . Sexual activity: Never  Other Topics Concern  . Not on file  Social History Narrative   Married with 2 children.  Independent of ADLs.      Does not have a living will.   Would desire CPR but would not want prolonged life support if futile- husband aware.  Social Determinants of Health   Financial Resource Strain:   . Difficulty of Paying Living Expenses:   Food Insecurity:   . Worried About Charity fundraiser in the Last  Year:   . Arboriculturist in the Last Year:   Transportation Needs:   . Film/video editor (Medical):   [name] Lack of Transportation (Non-Medical):   Physical Activity:   . Days of Exercise per Week:   . Minutes of Exercise per Session:   Stress:   . Feeling of Stress :   Social Connections:   . Frequency of Communication with Friends and Family:   . Frequency of Social Gatherings with Friends and Family:   . Attends Religious Services:   . Active Member of Clubs or Organizations:   . Attends Archivist Meetings:   [name] Marital Status:     Allergies:  Allergies  Allergen Reactions  . Amoxicillin-Pot Clavulanate     REACTION: gi upset Has patient had a PCN reaction causing immediate rash, facial/tongue/throat swelling, SOB or lightheadedness with hypotension: No Has patient had a PCN reaction causing severe rash involving mucus membranes or skin necrosis: No Has patient had a PCN reaction that required hospitalization : No Has patient had a PCN reaction occurring within the last 10 years: No If all of the above answers are "NO", then may proceed with Cephalosporin use.   . Cefdinir     REACTION: gi  upset  . Moxifloxacin     REACTION: rash  . Singulair [Montelukast Sodium]     Itching     Objective:    Vital Signs:   Temp:  [97.5 F (36.4 C)-98.5 F (36.9 C)] 97.8 F (36.6 C) (06/14 0841) Pulse Rate:  [80-107] 86 (06/14 0841) Resp:  [12-34] 15 (06/14 0841) BP: (96-131)/(53-74) 106/56 (06/14 0813) SpO2:  [92 %-100 %] 92 % (06/14 0841) Weight:  [63 kg] 63 kg (06/13 2055) Last BM Date: 02/18/20  Weight change: Filed Weights   02/19/20 0404 02/19/20 2055  Weight: 61.2 kg 63 kg    Intake/Output:   Intake/Output Summary (Last 24 hours) at 02/20/2020 0957 Last data filed at 02/19/2020 1151 Gross per 24 hour  Intake 100 ml  Output --  Net 100 ml      Physical Exam    General:  Appears chronically ill  No resp difficulty HEENT: normal.  Telangiectasia on cheeks  Neck: supple. JVP 6-7 . Carotids 2+ bilat; no bruits. No lymphadenopathy or thyromegaly appreciated. Cor: PMI nondisplaced. Irregular rate & rhythm. No rubs, gallops or murmurs. Lungs: clear on 3 liters  Abdomen: soft, nontender, nondistended. No hepatosplenomegaly. No bruits or masses. Good bowel sounds. Extremities: no cyanosis, clubbing, rash, edema Neuro: alert & orientedx3, cranial nerves grossly intact. moves all 4 extremities w/o difficulty. Affect pleasant   Telemetry   SR ---> in A fib  1115  EKG     A fib 103 bpm  Labs   Basic Metabolic Panel: Recent Labs  Lab 02/19/20 0405 02/19/20 1039 02/20/20 0240  NA 138  --  136  K 4.3  --  3.7  CL 92*  --  87*  CO2 30  --  37*  GLUCOSE 205*  --  136*  BUN 13  --  25*  CREATININE 1.01*  --  1.45*  CALCIUM 7.9*  --  8.3*  MG  --  1.9  --     Liver Function Tests: Recent Labs  Lab 02/19/20 0405  AST  90*  ALT 34  ALKPHOS 130*  BILITOT 1.9*  PROT 6.1*  ALBUMIN 3.1*   No results for input(s): LIPASE, AMYLASE in the last 168 hours. No results for input(s): AMMONIA in the last 168 hours.  CBC: Recent Labs  Lab 02/19/20 0405 02/20/20 0240  WBC 11.4* 17.9*  NEUTROABS 9.8*  --   HGB 12.0 11.0*  HCT 38.5 35.6*  MCV 101.0* 100.3*  PLT 309 251    Cardiac Enzymes: No results for input(s): CKTOTAL, CKMB, CKMBINDEX, TROPONINI in the last 168 hours.  BNP: BNP (last 3 results) Recent Labs    05/12/19 1224 12/09/19 1239 02/19/20 0405  BNP 272.8* 324.2* 164.0*    ProBNP (last 3 results) No results for input(s): PROBNP in the last 8760 hours.   CBG: No results for input(s): GLUCAP in the last 168 hours.  Coagulation Studies: No results for input(s): LABPROT, INR in the last 72 hours.   Imaging   ECHOCARDIOGRAM COMPLETE  Result Date: 02/19/2020    ECHOCARDIOGRAM REPORT   Patient Name:   CASSIDEE DEATS Date of Exam: 02/19/2020 Medical Rec #:  [id number]           Height:        63.0 in Accession #:    [id number]          Weight:       135.0 lb Date of Birth:  1945/11/12           BSA:          1.636 m Patient Age:    56 years            BP:           104/65 mmHg Patient Gender: F                   HR:           88 bpm. Exam Location:  Inpatient Procedure: 2D Echo, Cardiac Doppler and Color Doppler STAT ECHO                            MODIFIED REPORT: This report was modified by Dorris Carnes MD on 02/19/2020 due to Complete.  Indications:     Pericardial effusion 423.9 / I31.3  History:         Patient has prior history of Echocardiogram examinations, most                  recent 12/09/2019. CHF, COPD; Risk Factors:Hypertension and                  Current Smoker. Chronic respiratory failure with hypoxia.  Sonographer:     Alvino Chapel RCS Referring Phys:  [id number] [place] Diagnosing Phys: Dorris Carnes MD IMPRESSIONS  1. Left ventricular ejection fraction, by estimation, is 55 to 60%. The left ventricle has normal function. The left ventricle has no regional wall motion abnormalities. There is moderate left ventricular hypertrophy. Left ventricular diastolic parameters are consistent with Grade I diastolic dysfunction (impaired relaxation).  2. Right ventricular systolic function is mildly reduced. The right ventricular size is normal. Mildly increased right ventricular wall thickness.  3. A large pericardial effusions surrounds heart, mor prominent in the inferior and lateral surfaces There is no significant respiratory variation in mitral inflow, ther RV/RA do not collapse; the IVC is dilated however with decreased respiratory variation.     Ther is some soft tissue attenuation  within this, may reflect some pericardial fat vs consolidation. Compared to echo from April 2021 this is new. Effusioon was trivial at that time Clinical correlation indicated. . Large pericardial effusion. The pericardial effusion is circumferential.  4. The mitral valve is grossly normal. Mild mitral valve  regurgitation.  5. The aortic valve is abnormal. Aortic valve regurgitation is not visualized. Mild aortic valve sclerosis is present, with no evidence of aortic valve stenosis.  6. The inferior vena cava is dilated in size with <50% respiratory variability, suggesting right atrial pressure of 15 mmHg. FINDINGS  Left Ventricle: Left ventricular ejection fraction, by estimation, is 55 to 60%. The left ventricle has normal function. The left ventricle has no regional wall motion abnormalities. The left ventricular internal cavity size was small. There is moderate  left ventricular hypertrophy. Left ventricular diastolic parameters are consistent with Grade I diastolic dysfunction (impaired relaxation). Right Ventricle: The right ventricular size is normal. Mildly increased right ventricular wall thickness. Right ventricular systolic function is mildly reduced. Left Atrium: Left atrial size was normal in size. Right Atrium: Right atrial size was normal in size. Pericardium: A large pericardial effusions surrounds heart, mor prominent in the inferior and lateral surfaces There is no significant respiratory variation in mitral inflow, ther RV/RA do not collapse; the IVC is dilated however with decreased respiratory variation. Ther is some soft tissue attenuation within this, may reflect some pericardial fat vs consolidation. Compared to echo from April 2021 this is new. Effusioon was trivial at that time Clinical correlation indicated. A large pericardial effusion is present.  The pericardial effusion is circumferential. Mitral Valve: The mitral valve is grossly normal. Mild mitral valve regurgitation. Tricuspid Valve: The tricuspid valve is normal in structure. Tricuspid valve regurgitation is trivial. Aortic Valve: The aortic valve is abnormal. Aortic valve regurgitation is not visualized. Mild aortic valve sclerosis is present, with no evidence of aortic valve stenosis. Pulmonic Valve: The pulmonic valve was not well  visualized. Pulmonic valve regurgitation is not visualized. Aorta: The aortic root and ascending aorta are structurally normal, with no evidence of dilitation. Venous: The inferior vena cava is dilated in size with less than 50% respiratory variability, suggesting right atrial pressure of 15 mmHg. IAS/Shunts: No atrial level shunt detected by color flow Doppler.  LEFT VENTRICLE PLAX 2D LVIDd:         3.60 cm  Diastology LVIDs:         2.50 cm  LV e' lateral:   5.22 cm/s LV PW:         1.00 cm  LV E/e' lateral: 16.3 LV IVS:        0.90 cm  LV e' medial:    8.05 cm/s LVOT diam:     1.80 cm  LV E/e' medial:  10.6 LV SV:         56 LV SV Index:   35 LVOT Area:     2.54 cm  RIGHT VENTRICLE RV S prime:     11.00 cm/s TAPSE (M-mode): 1.6 cm LEFT ATRIUM             Index       RIGHT ATRIUM           Index LA Vol (A2C):   54.2 ml 33.13 ml/m RA Area:     15.00 cm LA Vol (A4C):   45.3 ml 27.69 ml/m RA Volume:   35.30 ml  21.57 ml/m LA Biplane Vol: 51.6 ml 31.54 ml/m  AORTIC VALVE LVOT Vmax:  107.00 cm/s LVOT Vmean:  63.200 cm/s LVOT VTI:    0.222 m MITRAL VALVE MV Area (PHT): 3.77 cm    SHUNTS MV Decel Time: 201 msec    Systemic VTI:  0.22 m MV E velocity: 85.20 cm/s  Systemic Diam: 1.80 cm MV A velocity: 93.60 cm/s MV E/A ratio:  0.91 Dorris Carnes MD Electronically signed by Dorris Carnes MD Signature Date/Time: 02/19/2020/1:33:51 PM    Final (Updated)       Medications:     Current Medications: . acidophilus  1 capsule Oral Daily  . ambrisentan  10 mg Oral Daily  . aspirin EC  81 mg Oral Daily  . enoxaparin (LOVENOX) injection  40 mg Subcutaneous Q24H  . fluticasone  2 spray Each Nare Daily  . umeclidinium bromide  2 puff Inhalation Daily   And  . fluticasone furoate-vilanterol  2 puff Inhalation Daily  . guaiFENesin  600 mg Oral BID  . [START ON 02/21/2020] methylPREDNISolone (SOLU-MEDROL) injection  60 mg Intravenous Q24H  . tadalafil (PAH)  40 mg Oral Daily     Infusions: . azithromycin    .  cefTRIAXone (ROCEPHIN)  IV    . [START ON 02/21/2020] vancomycin          Assessment/Plan   1.  A/C Hypoxic Respiratory Failure/ possible pneumonia / AECOPD?  Oxygen requirement on admit ncreased on admit to 6 liters. Now back down on 3 liters.  Sats stable.  - Lactic Acid 3.4>2.2>2.1  - WBC 17.9  - Sed Rate 63.  - Blood CX pending - On Rocephin  + azithromycin + solumedrol   2. Pericardial Effusion  - Large pericardial effusion on ECHO and CTA.  - Repeat ECHO in am  - Possible inflammatory process. Inflammatory markers ordered   3. Pulmonary HTN On sildenafil + letaris , continue On ECHO PA pressure 87 mm hg    4.. Chronic Diastolic HF  ECHO this admit preserved LVEF , Grade IDD - Volume status stable. Watch closely with addition of steroids.   5. PAF  -In A fib briefly on admit but went back in NSR.  -Later this after went in  A fib RVR. Start amio drip will not discharge on amio at discharge with COPD. -May need anticoagulants but concerned about pericardial effusion.   6. AKI  Creatinine trending up 1>1.4  - Follow BMET     Length of Stay: 1  Amy Clegg, NP  02/20/2020, 9:57 AM  Advanced Heart Failure Team Pager [phone] (M-F; 7a - 4p)  Please contact [facility] for night-coverage after hours (4p -7a ) and weekends on [email or web address]  Patient seen and examined with the above-signed Advanced Practice Provider and/or Housestaff. I personally reviewed laboratory data, imaging studies and relevant notes. I independently examined the patient and formulated the important aspects of the plan. I have edited the note to reflect any of my changes or salient points. I have personally discussed the plan with the patient and/or family.  Complicated case. 74 y/o woman with COPD and PAH. She was diagnosed with PAH and RV dysfunction several years ago. Also had moderate pericardial effusion. She has been treated with combination therapy for her PAH and subsequently RV has  completely normalized.   Now readmitted with pleuritic CP and larger pericardial effusion without over tamponade. CT chest no clear PNA. Denies fevers or green sputum,   General:  Weak appearing. No resp difficulty On O2 HEENT: normal Neck: supple. JVP to jaw. Carotids 2+ bilat; no  bruits. No lymphadenopathy or thryomegaly appreciated. Cor: PMI nondisplaced. Regular rate & rhythm. No rubs, gallops or murmurs. Lungs: decreased BS throughout Abdomen: soft, nontender, nondistended. No hepatosplenomegaly. No bruits or masses. Good bowel sounds. Extremities: no cyanosis, clubbing, rash, edema + telegenectasias Neuro: alert & orientedx3, cranial nerves grossly intact. moves all 4 extremities w/o difficulty. Affect pleasant  I have reviewed echo and CT personally. My suspicion is for flare of underlying connective tissue disease with pericardial and pleural involvement. Previous rheum w/u has been unremarkable but will resend serologies. Agree with high-dose steroids. Repeat echo tomorrow if effusion worsening or develops hemodynamic compromise will need pericardial window vs tap. No diuretics for now.   [name], MD  4:52 PM

## 2020-02-20 NOTE — Consult Note (Signed)
[facility]    Date of Admission:  02/19/2020           Day 2 vancomycin        Day 2 ceftriaxone       Reason for Consult: Blood cultures positive for methicillin-resistant coagulase-negative staph    Referring Provider: Dr. Wendee Beavers  Assessment: It is unclear to me if she has true bacteremia or contamination of both sets of blood cultures.  I will continue vancomycin alone for now.  I will asked the lab to do antibiotic susceptibilities on both isolates to see if they are the same.  Plan: 1. Continue vancomycin 2. Discontinue ceftriaxone 3. Await final blood culture results  Principal Problem:   Positive blood cultures Active Problems:   Community acquired pneumonia   Congestive heart failure (CHF) (HCC)   Pulmonary artery hypertension (HCC)   Acute on chronic diastolic congestive heart failure (HCC)   Acute on chronic respiratory failure with hypoxia (HCC)   Pericardial effusion   Hepatic cirrhosis (HCC)   Pleural effusion, bilateral   New onset atrial fibrillation (HCC)   Scheduled Meds: . acidophilus  1 capsule Oral Daily  . ambrisentan  10 mg Oral Daily  . aspirin EC  81 mg Oral Daily  . enoxaparin (LOVENOX) injection  40 mg Subcutaneous Q24H  . fluticasone  2 spray Each Nare Daily  . umeclidinium bromide  2 puff Inhalation Daily   And  . fluticasone furoate-vilanterol  2 puff Inhalation Daily  . guaiFENesin  600 mg Oral BID  . [START ON 02/21/2020] methylPREDNISolone (SOLU-MEDROL) injection  60 mg Intravenous Q24H  . tadalafil (PAH)  40 mg Oral Daily   Continuous Infusions: . amiodarone 60 mg/hr (02/20/20 1500)   Followed by  . amiodarone    . cefTRIAXone (ROCEPHIN)  IV 200 mL/hr at 02/20/20 1500  . [START ON 02/21/2020] vancomycin     PRN Meds:.acetaminophen **OR** acetaminophen, morphine injection, ondansetron **OR** ondansetron (ZOFRAN) IV, promethazine, traMADol  HPI: Linda Stevens is a 74 y.o. female with a  history of COPD, cirrhosis, heart failure and chronic respiratory failure.  She has been developing worsening shortness of breath over the past 3 weeks.  She saw her pulmonologist, Dr. Clois Comber, on 02/13/2020 and he prescribed a Z-Pak and prednisone taper.  She continued to be bothered by shortness of breath leading to admission yesterday.  She has not had any fever, chills, sweats, diarrhea or dysuria.  Her lactic acid was noted to be elevated upon admission and blood cultures were obtained.  She was started on empiric antibiotic therapy.  Both sets of blood cultures are now growing gram-positive cocci and methicillin-resistant coagulase-negative staph was positive on BCID.  She is feeling better today.   Review of Systems: Review of Systems  Constitutional: Negative for chills, diaphoresis and fever.  Respiratory: Positive for cough and shortness of breath. Negative for hemoptysis, sputum production and wheezing.        No change in her mild, chronic hacking cough.  Cardiovascular: Negative for chest pain.  Gastrointestinal: Negative for abdominal pain, diarrhea, nausea and vomiting.  Genitourinary: Negative for dysuria.  Musculoskeletal: Positive for back pain.    Past Medical History:  Diagnosis Date  . Acute respiratory failure with hypoxia and hypercapnea 02/26/2014  . Allergic rhinitis due to pollen   . Allergy   . Arthritis   . Asthma   . CHF (congestive heart failure) ([place])   .  COPD (chronic obstructive pulmonary disease) ([place])   . Diastolic dysfunction    a. 02/2014 Echo: EF 65-70%, Gr 1 DD, RVH;  b. 09/2015 Echo: EF 55%, Gr 1 DD.  [name] Emphysema of lung ([place])   . H/O seasonal allergies   . History of chicken pox   . History of tobacco abuse    a. Quit 2015.  [name] History of UTI   . Hypertension   . Hypertensive heart disease   . Lower extremity edema   . Pericardial effusion    a. 09/2015 Echo: Mod eff w/o tamponade.  . Right heart failure with reduced right ventricular function  ([place])    a. 09/2015 Echo: EF 55%, Gr 1 DD, D shaped IV septum, sev dil RV with mod reduced fxn, mod dil RA, RV-RA grad 2mHg, PASP 834mg, mod pericard eff w/o tamponade.  . Severe Pulmonary Hypertension    a. 09/2015 Echo: PASP 8710m.    Social History   Tobacco Use  . Smoking status: Former Smoker    Packs/day: 1.00    Years: 40.00    Pack years: 40.00    Types: Cigarettes    Quit date: 02/22/2014    Years since quitting: 5.9  . Smokeless tobacco: Never Used  Vaping Use  . Vaping Use: Never used  Substance Use Topics  . Alcohol use: No  . Drug use: No    Family History  Problem Relation Age of Onset  . Glaucoma Brother   . Vascular Disease Father        Died of complications from surgery  . Heart attack Father   . Asthma Mother        Died of asthma attack  . Dementia Mother   . Hyperlipidemia Mother   . Hypertension Mother    Allergies  Allergen Reactions  . Amoxicillin-Pot Clavulanate     REACTION: gi upset Has patient had a PCN reaction causing immediate rash, facial/tongue/throat swelling, SOB or lightheadedness with hypotension: No Has patient had a PCN reaction causing severe rash involving mucus membranes or skin necrosis: No Has patient had a PCN reaction that required hospitalization : No Has patient had a PCN reaction occurring within the last 10 years: No If all of the above answers are "NO", then may proceed with Cephalosporin use.   . Cefdinir     REACTION: gi  upset  . Moxifloxacin     REACTION: rash  . Singulair [Montelukast Sodium]     Itching     OBJECTIVE: Blood pressure (!) 108/53, pulse 79, temperature 97.7 F (36.5 C), temperature source Oral, resp. rate 15, height _0  (1.6 m), weight 63 kg, SpO2 95 %.  Physical Exam Constitutional:      Comments: She is in good spirits.  Her family is visiting.  Cardiovascular:     Rate and Rhythm: Normal rate and regular rhythm.     Heart sounds: No murmur heard.   Pulmonary:     Effort:  Pulmonary effort is normal.     Breath sounds: No wheezing or rales.     Comments: She is using supplemental nasal prong oxygen.  She has very distant breath sounds. Abdominal:     Palpations: Abdomen is soft.     Tenderness: There is no abdominal tenderness.  Musculoskeletal:        General: No swelling or tenderness.  Skin:    Comments: Scattered patches of psoriasis.  Psychiatric:        Mood and Affect: Mood  normal.     Lab Results Lab Results  Component Value Date   WBC 17.9 (H) 02/20/2020   HGB 11.0 (L) 02/20/2020   HCT 35.6 (L) 02/20/2020   MCV 100.3 (H) 02/20/2020   PLT 251 02/20/2020    Lab Results  Component Value Date   CREATININE 1.45 (H) 02/20/2020   BUN 25 (H) 02/20/2020   NA 136 02/20/2020   K 3.7 02/20/2020   CL 87 (L) 02/20/2020   CO2 37 (H) 02/20/2020    Lab Results  Component Value Date   ALT 34 02/19/2020   AST 90 (H) 02/19/2020   ALKPHOS 130 (H) 02/19/2020   BILITOT 1.9 (H) 02/19/2020     Microbiology: Recent Results (from the past 240 hour(s))  SARS Coronavirus 2 by RT PCR (hospital order, performed in [place] hospital lab) Nasopharyngeal Nasopharyngeal Swab     Status: None   Collection Time: 02/19/20  5:27 AM   Specimen: Nasopharyngeal Swab  Result Value Ref Range Status   SARS Coronavirus 2 NEGATIVE NEGATIVE Final    Comment: (NOTE) SARS-CoV-2 target nucleic acids are NOT DETECTED.  The SARS-CoV-2 RNA is generally detectable in upper and lower respiratory specimens during the acute phase of infection. The lowest concentration of SARS-CoV-2 viral copies this assay can detect is 250 copies / mL. A negative result does not preclude SARS-CoV-2 infection and should not be used as the sole basis for treatment or other patient management decisions.  A negative result may occur with improper specimen collection / handling, submission of specimen other than nasopharyngeal swab, presence of viral mutation(s) within the areas targeted by  this assay, and inadequate number of viral copies (<250 copies / mL). A negative result must be combined with clinical observations, patient history, and epidemiological information.  Fact Sheet for Patients:   StrictlyIdeas.no  Fact Sheet for Healthcare Providers: BankingDealers.co.za  This test is not yet approved or  cleared by the [place] FDA and has been authorized for detection and/or diagnosis of SARS-CoV-2 by FDA under an Emergency Use Authorization (EUA).  This EUA will remain in effect (meaning this test can be used) for the duration of the COVID-19 declaration under Section 564(b)(1) of the Act, 21 U.S.C. section 360bbb-3(b)(1), unless the authorization is terminated or revoked sooner.  Performed at [facility], [place] [address] [place], [place] [address]   Culture, blood (routine x 2)     Status: None (Preliminary result)   Collection Time: 02/19/20 10:36 AM   Specimen: BLOOD RIGHT HAND  Result Value Ref Range Status   Specimen Description BLOOD RIGHT HAND  Final   Special Requests   Final    BOTTLES DRAWN AEROBIC AND ANAEROBIC Blood Culture adequate volume   Culture  Setup Time   Final    GRAM POSITIVE COCCI IN CLUSTERS IN BOTH AEROBIC AND ANAEROBIC BOTTLES CRITICAL VALUE NOTED.  VALUE IS CONSISTENT WITH PREVIOUSLY REPORTED AND CALLED VALUE. Performed at [facility], [place] [address], [place], [place] [address]    Culture GRAM POSITIVE COCCI  Final   Report Status PENDING  Incomplete  Culture, blood (routine x 2)     Status: None (Preliminary result)   Collection Time: 02/19/20 10:48 AM   Specimen: BLOOD  Result Value Ref Range Status   Specimen Description BLOOD RIGHT ANTECUBITAL  Final   Special Requests   Final    BOTTLES DRAWN AEROBIC AND ANAEROBIC Blood Culture adequate volume   Culture  Setup Time  Final    GRAM POSITIVE COCCI IN CLUSTERS IN BOTH AEROBIC AND ANAEROBIC BOTTLES CRITICAL  RESULT CALLED TO, READ BACK BY AND VERIFIED WITH: PHRMD V [place] _0  02/20/20 BY S GEZAHEGN Performed at [facility], [place] [address] [place], [place] [address]    Culture GRAM POSITIVE COCCI  Final   Report Status PENDING  Incomplete  Blood Culture ID Panel (Reflexed)     Status: Abnormal   Collection Time: 02/19/20 10:48 AM  Result Value Ref Range Status   Enterococcus species NOT DETECTED NOT DETECTED Final   Listeria monocytogenes NOT DETECTED NOT DETECTED Final   Staphylococcus species DETECTED (A) NOT DETECTED Final    Comment: Methicillin (oxacillin) resistant coagulase negative staphylococcus. Possible blood culture contaminant (unless isolated from more than one blood culture draw or clinical case suggests pathogenicity). No antibiotic treatment is indicated for blood  culture contaminants. CRITICAL RESULT CALLED TO, READ BACK BY AND VERIFIED WITH: PHRMD V BRYK _1  02/20/20 BY S GEZAHEGN    Staphylococcus aureus (BCID) NOT DETECTED NOT DETECTED Final   Methicillin resistance DETECTED (A) NOT DETECTED Final    Comment: CRITICAL RESULT CALLED TO, READ BACK BY AND VERIFIED WITH: PHRMD V BRYK _2  02/20/20 BY S GEZAHEGN    Streptococcus species NOT DETECTED NOT DETECTED Final   Streptococcus agalactiae NOT DETECTED NOT DETECTED Final   Streptococcus pneumoniae NOT DETECTED NOT DETECTED Final   Streptococcus pyogenes NOT DETECTED NOT DETECTED Final   Acinetobacter baumannii NOT DETECTED NOT DETECTED Final   Enterobacteriaceae species NOT DETECTED NOT DETECTED Final   Enterobacter cloacae complex NOT DETECTED NOT DETECTED Final   Escherichia coli NOT DETECTED NOT DETECTED Final   Klebsiella oxytoca NOT DETECTED NOT DETECTED Final   Klebsiella pneumoniae NOT DETECTED NOT DETECTED Final   Proteus species NOT DETECTED NOT DETECTED Final   Serratia marcescens NOT DETECTED NOT DETECTED Final   Haemophilus influenzae NOT DETECTED NOT DETECTED Final   Neisseria meningitidis NOT  DETECTED NOT DETECTED Final   Pseudomonas aeruginosa NOT DETECTED NOT DETECTED Final   Candida albicans NOT DETECTED NOT DETECTED Final   Candida glabrata NOT DETECTED NOT DETECTED Final   Candida krusei NOT DETECTED NOT DETECTED Final   Candida parapsilosis NOT DETECTED NOT DETECTED Final   Candida tropicalis NOT DETECTED NOT DETECTED Final    Comment: Performed at [facility], [place]. [address], [place], [place] [address]  MRSA PCR Screening     Status: None   Collection Time: 02/19/20  8:59 PM   Specimen: Nasopharyngeal  Result Value Ref Range Status   MRSA by PCR NEGATIVE NEGATIVE Final    Comment:        The GeneXpert MRSA Assay (FDA approved for NASAL specimens only), is one component of a comprehensive MRSA colonization surveillance program. It is not intended to diagnose MRSA infection nor to guide or monitor treatment for MRSA infections. Performed at [facility], [place] [address] [place], [place] [address]     Michel Bickers, [place] [facility] [employer] [phone] pager   [phone] cell 02/20/2020, 4:54 PM

## 2020-02-21 ENCOUNTER — Inpatient Hospital Stay (HOSPITAL_COMMUNITY): Payer: Medicare HMO

## 2020-02-21 DIAGNOSIS — I313 Pericardial effusion (noninflammatory): Secondary | ICD-10-CM

## 2020-02-21 LAB — CBC
HCT: 32.2 % — ABNORMAL LOW (ref 36.0–46.0)
Hemoglobin: 10.1 g/dL — ABNORMAL LOW (ref 12.0–15.0)
MCH: 31.4 pg (ref 26.0–34.0)
MCHC: 31.4 g/dL (ref 30.0–36.0)
MCV: 100 fL (ref 80.0–100.0)
Platelets: 250 10*3/uL (ref 150–400)
RBC: 3.22 MIL/uL — ABNORMAL LOW (ref 3.87–5.11)
RDW: 16.1 % — ABNORMAL HIGH (ref 11.5–15.5)
WBC: 15.8 10*3/uL — ABNORMAL HIGH (ref 4.0–10.5)
nRBC: 0.1 % (ref 0.0–0.2)

## 2020-02-21 LAB — RENAL FUNCTION PANEL
Albumin: 2.9 g/dL — ABNORMAL LOW (ref 3.5–5.0)
Anion gap: 13 (ref 5–15)
BUN: 43 mg/dL — ABNORMAL HIGH (ref 8–23)
CO2: 38 mmol/L — ABNORMAL HIGH (ref 22–32)
Calcium: 8.6 mg/dL — ABNORMAL LOW (ref 8.9–10.3)
Chloride: 89 mmol/L — ABNORMAL LOW (ref 98–111)
Creatinine, Ser: 1.49 mg/dL — ABNORMAL HIGH (ref 0.44–1.00)
GFR calc Af Amer: 41 mL/min — ABNORMAL LOW (ref >60)
GFR calc non Af Amer: 35 mL/min — ABNORMAL LOW (ref >60)
Glucose, Bld: 129 mg/dL — ABNORMAL HIGH (ref 70–99)
Phosphorus: 5.4 mg/dL — ABNORMAL HIGH (ref 2.5–4.6)
Potassium: 3.8 mmol/L (ref 3.5–5.1)
Sodium: 140 mmol/L (ref 135–145)

## 2020-02-21 LAB — ECHOCARDIOGRAM LIMITED
Height: 63 in
Weight: 2222.24 oz

## 2020-02-21 LAB — ANTI-SCLERODERMA ANTIBODY: Scleroderma (Scl-70) (ENA) Antibody, IgG: <0.2 AI (ref 0.0–0.9)

## 2020-02-21 LAB — MAGNESIUM: Magnesium: 2.5 mg/dL — ABNORMAL HIGH (ref 1.7–2.4)

## 2020-02-21 LAB — ANTI-DNA ANTIBODY, DOUBLE-STRANDED: ds DNA Ab: 1 IU/mL (ref 0–9)

## 2020-02-21 LAB — CYCLIC CITRUL PEPTIDE ANTIBODY, IGG/IGA: CCP Antibodies IgG/IgA: 15 units (ref 0–19)

## 2020-02-21 LAB — ANA W/REFLEX IF POSITIVE: Anti Nuclear Antibody (ANA): NEGATIVE

## 2020-02-21 MED ORDER — AMIODARONE LOAD VIA INFUSION
150.0000 mg | Freq: Once | INTRAVENOUS | Status: AC
  Administered 2020-02-21: 150 mg via INTRAVENOUS
  Filled 2020-02-21: qty 83.34

## 2020-02-21 MED ORDER — AMIODARONE IV BOLUS ONLY 150 MG/100ML
150.0000 mg | Freq: Once | INTRAVENOUS | Status: DC
  Filled 2020-02-21: qty 100

## 2020-02-21 NOTE — Progress Notes (Signed)
  Echocardiogram 2D Echocardiogram has been performed.  Jennette Dubin 02/21/2020, 2:43 PM

## 2020-02-21 NOTE — Progress Notes (Signed)
This note also relates to the following rows which could not be included: SpO2 - Cannot attach notes to unvalidated device data    02/21/20 1500  Assess: MEWS Score  BP 91/61  Pulse Rate (!) 136  ECG Heart Rate (!) 124  Resp 16  Assess: MEWS Score  MEWS Temp 0  MEWS Systolic 1  MEWS Pulse 2  MEWS RR 0  MEWS LOC 0  MEWS Score 3  MEWS Score Color Yellow  Notify: Provider  Provider Name/Title Justice Britain, PA  Date Provider Notified 02/21/20  Time Provider Notified 1445  Notification Type Face-to-face  Response See new orders  Date of Provider Response 02/21/20  Time of Provider Response 1500   Brittnay Simmons at bedside assessing Pt, and ordered Amiodarone bolus for the Pt, it was administered to Pt.  Continue to monitor the Pt.

## 2020-02-21 NOTE — Plan of Care (Signed)

## 2020-02-21 NOTE — Progress Notes (Signed)
This note also relates to the following rows which could not be included: SpO2 - Cannot attach notes to unvalidated device data    02/21/20 1543  Vitals  Pulse Rate 76  ECG Heart Rate 76  Resp 16   Converted out of A-Fib

## 2020-02-21 NOTE — Progress Notes (Addendum)
Advanced Heart Failure Rounding Note  PCP-Cardiologist: [name], MD   Subjective:    Linda Stevens started on amiodarone for A fib RVR. Converted to NSR.   Complaining left sided chest pain. Had tramadol yesterday with relief of pain.    Objective:   Weight Range: 63 kg Body mass index is 24.6 kg/m.   Vital Signs:   Temp:  [97.6 F (36.4 C)-98.2 F (36.8 C)] 97.6 F (36.4 C) (06/15 0319) Pulse Rate:  [65-161] 65 (06/15 0319) Resp:  [13-24] 16 (06/14 2300) BP: (82-123)/(51-93) 110/51 (06/15 0319) SpO2:  [91 %-100 %] 92 % (06/15 0735) Last BM Date: 02/18/20  Weight change: Filed Weights   02/19/20 0404 02/19/20 2055  Weight: 61.2 kg 63 kg    Intake/Output:   Intake/Output Summary (Last 24 hours) at 02/21/2020 0742 Last data filed at 02/21/2020 0507 Gross per 24 hour  Intake 370.56 ml  Output 200 ml  Net 170.56 ml      Physical Exam    General:  Appear chronically ill  No resp difficulty HEENT: Normal Neck: Supple. JVP 7-8 . Carotids 2+ bilat; no bruits. No lymphadenopathy or thyromegaly appreciated. Cor: PMI nondisplaced. Regular rate & rhythm. No rubs, gallops or murmurs. Lungs: Clear Abdomen: Soft, nontender, nondistended. No hepatosplenomegaly. No bruits or masses. Good bowel sounds. Extremities: No cyanosis, clubbing, rash, edema Neuro: Alert & orientedx3, cranial nerves grossly intact. moves all 4 extremities w/o difficulty. Affect pleasant   Telemetry  NSR 60s   EKG   n/a   Labs    CBC Recent Labs    02/19/20 0405 02/19/20 0405 02/20/20 0240 02/21/20 0227  WBC 11.4*   < > 17.9* 15.8*  NEUTROABS 9.8*  --   --   --   HGB 12.0   < > 11.0* 10.1*  HCT 38.5   < > 35.6* 32.2*  MCV 101.0*   < > 100.3* 100.0  PLT 309   < > 251 250   < > = values in this interval not displayed.   Basic Metabolic Panel Recent Labs    02/20/20 0240 02/21/20 0227  NA 136 140  K 3.7 3.8  CL 87* 89*  CO2 37* 38*  GLUCOSE 136* 129*  BUN 25* 43*    CREATININE 1.45* 1.49*  CALCIUM 8.3* 8.6*  MG 2.1 2.5*  PHOS  --  5.4*   Liver Function Tests Recent Labs    02/19/20 0405 02/21/20 0227  AST 90*  --   ALT 34  --   ALKPHOS 130*  --   BILITOT 1.9*  --   PROT 6.1*  --   ALBUMIN 3.1* 2.9*   No results for input(s): LIPASE, AMYLASE in the last 72 hours. Cardiac Enzymes No results for input(s): CKTOTAL, CKMB, CKMBINDEX, TROPONINI in the last 72 hours.  BNP: BNP (last 3 results) Recent Labs    05/12/19 1224 12/09/19 1239 02/19/20 0405  BNP 272.8* 324.2* 164.0*    ProBNP (last 3 results) No results for input(s): PROBNP in the last 8760 hours.   D-Dimer No results for input(s): DDIMER in the last 72 hours. Hemoglobin A1C No results for input(s): HGBA1C in the last 72 hours. Fasting Lipid Panel No results for input(s): CHOL, HDL, LDLCALC, TRIG, CHOLHDL, LDLDIRECT in the last 72 hours. Thyroid Function Tests Recent Labs    02/19/20 1039  TSH 2.577    Other results:   Imaging     No results found.   Medications:  Scheduled Medications: . acidophilus  1 capsule Oral Daily  . ambrisentan  10 mg Oral Daily  . aspirin EC  81 mg Oral Daily  . enoxaparin (LOVENOX) injection  40 mg Subcutaneous Q24H  . fluticasone  2 spray Each Nare Daily  . umeclidinium bromide  2 puff Inhalation Daily   And  . fluticasone furoate-vilanterol  2 puff Inhalation Daily  . guaiFENesin  600 mg Oral BID  . methylPREDNISolone (SOLU-MEDROL) injection  60 mg Intravenous Q24H  . tadalafil (PAH)  40 mg Oral Daily     Infusions: . amiodarone 30 mg/hr (02/21/20 0504)  . cefTRIAXone (ROCEPHIN)  IV 200 mL/hr at 02/20/20 1500  . vancomycin 1,000 mg (02/21/20 0507)     PRN Medications:  acetaminophen **OR** acetaminophen, morphine injection, ondansetron **OR** ondansetron (ZOFRAN) IV, promethazine, traMADol   Assessment/Plan  1.  A/C Hypoxic Respiratory Failure/ possible pneumonia / AECOPD?  Oxygen requirement on admit  ncreased on admit to 6 liters. Now back down on 3 liters.  Sats stable.  - Lactic Acid 3.4>2.2>2.1  - WBC 17.9>15.8   - Sed Rate 63.  - Blood CX- ? Contaminates.  - Blood CX repeated 6/14  -Rocephin stopped 6/14.  On Vancomycin  + solumedrol   2. Pericardial Effusion  - Large pericardial effusion on ECHO and CTA.  - Repeat ECHO  This moring. in am  - Possible inflammatory process. Inflammatory markers ordered   3. Pulmonary HTN On sildenafil + letaris , continue On ECHO PA pressure 87 mm hg    4.. Chronic Diastolic HF  - ECHO this admit preserved LVEF , Grade IDD - Hold off on diuretics. Will need to restart soon.  - Watch closely with addition of steroids.   5. A Fib RVR  -Started on amio drip- converted to NSR.  -Later this after went in  A fib RVR. Start amio drip will not discharge on amio at discharge with COPD. -May need anticoagulants but concerned about pericardial effusion.   6. AKI  Creatinine trending up 1>1.4 >1.5 - BMET in am.     Length of Stay: 2  Darrick Grinder, NP  02/21/2020, 7:42 AM  Advanced Heart Failure Team Pager [phone] (M-F; 7a - 4p)  Please contact [facility] for night-coverage after hours (4p -7a ) and weekends on [email or web address]  Patient seen and examined with the above-signed Advanced Practice Provider and/or Housestaff. I personally reviewed laboratory data, imaging studies and relevant notes. I independently examined the patient and formulated the important aspects of the plan. I have edited the note to reflect any of my changes or salient points. I have personally discussed the plan with the patient and/or family.  Clinically improved today but still with episodes of pleuritic pain. Also has been in/out of AF. Bcx results noted. Remains on vancomycin,   Echo this evening shows preserved LVEF there is still a large pericardial effusion but does seem a bit smaller than Sunday. No evidence of tamponade  General:  Sitting in bed  No resp  difficulty HEENT: normal Neck: supple.JVP to jaw . Carotids 2+ bilat; no bruits. No lymphadenopathy or thryomegaly appreciated. Cor: PMI nondisplaced. Regular rate & rhythm. No rubs, gallops or murmurs. Lungs: decreased BS throughout.  Abdomen: soft, nontender, nondistended. No hepatosplenomegaly. No bruits or masses. Good bowel sounds. Extremities: no cyanosis, clubbing, rash, edema Neuro: alert & orientedx3, cranial nerves grossly intact. moves all 4 extremities w/o difficulty. Affect pleasant  Overall improving but still tenuous. Pericardial effusion seems a bit  smaller but still large. Rheum serologies remain surprisingly negative and thus I think we need to consider other etiologies for effusion and pleuritic CP (? Infectious/viral). Appreciate ID input. Will continue steroids. Repeat echo on Friday. No need for tap or window currently. Continue IV amio for PAF. No AC at this time with risk of hemorrhagic transformation of effusion.   [name], MD  9:39 PM

## 2020-02-21 NOTE — Progress Notes (Signed)
This note also relates to the following rows which could not be included: Pulse Rate - Cannot attach notes to unvalidated device data ECG Heart Rate - Cannot attach notes to unvalidated device data Resp - Cannot attach notes to unvalidated device data SpO2 - Cannot attach notes to unvalidated device data    02/21/20 1415  Notify: Provider  Provider Name/Title Ellen Henri, [place]  Date Provider Notified 02/21/20  Time Provider Notified 1415  Notification Type Page  Notification Reason Change in status    Pt off the floor for Echo.. notified by Telemetry that Pt was in A-Fib. Notified [place] that Pt was in A-fib and she was off the floor.  I went down to Echo to assess the Pt. She was as asymptomatic, b/p was 106/76.  I was able to bring her back to the unit, since her Echo was completed.  [place] wanted to be updated with v/s

## 2020-02-21 NOTE — Progress Notes (Signed)
Called to bedside after pt reverted back into afib w/ RVR in the 140s. SBP in the 120s. Pt alert and stable. Not highly symptomatic. Currently on amio gtt at 30 mg/hr.    Order given to rebolus w/ amio, 150 mg x 1. RN updated on plan.   Lyda Jester, PA-C

## 2020-02-21 NOTE — Progress Notes (Addendum)
PROGRESS NOTE  Linda Stevens YQM:[id number] DOB: 07/24/1946   PCP: [name], MD  Patient is from: Home.  DOA: 02/19/2020 LOS: 2  Brief Narrative / Interim history: 74 year old female with history of COPD/chronic RF on 3 L, diastolic CHF, pulmonary HTN, HTN and HLD presenting with progressive shortness of breath for 1 week.  Tried with a course of Z-Pak and a steroid taper by her pulmonologist on 6/9 but continues to have progressive shortness of breath.  In ED, slightly hypotensive.  Required 6 L by [place].  WBC 11.4.  AST 90.  Total bili 1.9.  Lactic acid 2.8.  BNP 164.  Trop negative x2.  CXR concerning for cardiomegaly with small pleural effusion and by basilar atelectasis.  CTA chest showed sizable pericardial effusion, bilateral pleural effusions and consolidation concerning for pneumonia and pulmonary HTN.  Cultures drawn.  Received IV Solu-Medrol, morphine, Rocephin and azithromycin,and admitted for acute on chronic respiratory failure due to pneumonia, pleural effusions and pericardial effusion.  Cardiology consulted for pericardial effusion.  Echo obtained and revealed dilated and fixed IVC although no other features of tamponade.  The next day, blood cultures with GPC's in clusters in 4/4.  BICD with coag negative MR staph.  Vancomycin added.  ID consulted.  Azithromycin and ceftriaxone discontinued.  Repeat blood culture obtained and NGTD.    Patient went into A. fib with RVR with hypotension on 6/14.  Started on amiodarone drip and converted to sinus rhythm.  Advanced heart failure team involved as well.  Subjective: Seen and examined earlier this morning.  Reports some improvement in her breathing.  Still with intermittent left-sided chest pain that is resolved with pain medication.  Reports good urine output.  Still nauseous.  Patient and husband upset about visitation policy that they can not have more than 2 people visiting during her entire stay.  Objective: Vitals:    02/21/20 0319 02/21/20 0735 02/21/20 0800 02/21/20 1100  BP: (!) 110/51  116/60 118/72  Pulse: 65  68 68  Resp:   14 13  Temp: 97.6 F (36.4 C)  97.7 F (36.5 C) 97.7 F (36.5 C)  TempSrc: Oral  Oral Oral  SpO2: 100% 92% 95% 97%  Weight:      Height:        Intake/Output Summary (Last 24 hours) at 02/21/2020 1257 Last data filed at 02/21/2020 0507 Gross per 24 hour  Intake 370.56 ml  Output 200 ml  Net 170.56 ml   Filed Weights   02/19/20 0404 02/19/20 2055  Weight: 61.2 kg 63 kg    Examination:  GENERAL: No apparent distress.  Nontoxic. HEENT: MMM.  Vision and hearing grossly intact.  NECK: Supple.  Prominent JVD. RESP: 92% on 3.5 L.  No IWOB.  Rhonchi bilaterally. CVS:  RRR. Heart sounds normal.  ABD/GI/GU: BS+. Abd soft, NTND.  MSK/EXT:  Moves extremities. No apparent deformity.  Trace edema bilaterally. SKIN: no apparent skin lesion or wound NEURO: Awake, alert and oriented appropriately.  No apparent focal neuro deficit. PSYCH: Calm. Normal affect.  Procedures:  None  Microbiology summarized: COVID-19 PCR negative. MRSA PCR negative. Blood cultures with GPC's in clusters in 4/4. BICD with coag negative methicillin-resistant staph  Assessment & Plan: Coag negative MR staph bacteremia-blood cultures with GPC in clusters in 4/4.  BICD with coagulase-negative methicillin-resistant staph.  Repeat blood culture on 6/14 NGTD.  TTE without vegetation. -Appreciate ID guidance -Continue vancomycin 6/14>> -Ceftriaxone and azithromycin discontinued.   Community-acquired pneumonia: CTA concerning for  bibasilar consolidation with pleural effusion. -Antibiotics as above -Incentive spirometry and flutter valve  Acute on chronic respiratory failure with hypoxia: Multifactorial including infectious process, pericardiac and pleural effusion, pulmonary hypertension, underlying COPD and A. fib with RVR.  On 3 L at baseline. -Wean oxygen as able-minimum oxygen to keep  saturation above 90% -Treat potential etiologies  Pericardial effusion: Reported as sizable.  She has significant JVD with hypotension although hypotension could be due to A. Fib.  Echo with dilated and fixated IVC but no other features of tamponade. -Cardiology managing. -Plan for repeat echocardiogram today.  Pleural effusion: Looks a small on review of CTA chest -Continue antibiotics as above  Pulmonary hypertension: Felt to be secondary in the setting of underlying lung disease.  PAPP 87 mmHg. -Advanced heart failure team following. -On tadalafil, ambrisentan -Follow autoimmune labs  A. fib with RVR: Noted to have A. fib on EKG obtained by EMS.  Went into RVR on 6/14.  Converted to sinus rhythm with amiodarone drip. -Amiodarone per cardiology -Defer anticoagulation to cardiology. -Optimize Mg and K.  Chronic diastolic CHF: Echo with preserved EF and G1 DD.  BNP lower than baseline.  Reports good urine output although not captured.  Creatinine up trended.  Diuretics on hold per cardiology -Monitor fluid status, renal function and electrolytes -Defer diuretics to cardiology.  AKI/azotemia: Baseline Cr 0.8-0.9> 1.01 (admit)> 1.45> 1.49.  BUN 43.  Likely hemodynamically mediated. -Continue monitoring -Avoid nephrotoxic meds  Metabolic alkalosis: Could be due to contraction alkalosis. -Diuretics on hold -Continue monitoring.  Hyperphosphatemia: Likely due to renal failure. -Continue monitoring  Essential hypertension: Soft blood pressures. -Continue monitoring  Elevated liver enzymes/hyperbilirubinemia: Likely congestive hepatopathy. -Continue monitoring.  Acute on chronic COPD -IV Solu-Medrol 60 mg daily, Trelegy Ellipta  Liver cirrhosis-due to pulmonary hypertension/heart failure?  Elevated AST/hyperbilirubinemia-likely due to bacteremia and possible congestive etiology -Continue trending  Debility/physical deconditioning -PT/OT  Goal of care-significant  cardiopulmonary comorbidities as above.  Prognosis is not great.  She is a still full code.  This would probably pose more harm than benefit. -Will involve palliative care              DVT prophylaxis:  enoxaparin (LOVENOX) injection 40 mg Start: 02/19/20 1800  Code Status: Full code Family Communication: Updated patient's husband at bedside Status is: Inpatient  Remains inpatient appropriate because:Hemodynamically unstable, Ongoing diagnostic testing needed not appropriate for outpatient work up, IV treatments appropriate due to intensity of illness or inability to take PO and Inpatient level of care appropriate due to severity of illness   Dispo: The patient is from: Home              Anticipated d/c is to:  To be determined              Anticipated d/c date is: > 3 days              Patient currently is not medically stable to d/c.       Consultants:  Cardiology Advanced heart failure team Infectious disease Palliative care medicine   Sch Meds:  Scheduled Meds:  acidophilus  1 capsule Oral Daily   ambrisentan  10 mg Oral Daily   aspirin EC  81 mg Oral Daily   enoxaparin (LOVENOX) injection  40 mg Subcutaneous Q24H   fluticasone  2 spray Each Nare Daily   umeclidinium bromide  2 puff Inhalation Daily   And   fluticasone furoate-vilanterol  2 puff Inhalation Daily   guaiFENesin  600 mg Oral BID   methylPREDNISolone (SOLU-MEDROL) injection  60 mg Intravenous Q24H   tadalafil (PAH)  40 mg Oral Daily   Continuous Infusions:  amiodarone 30 mg/hr (02/21/20 0504)   vancomycin 1,000 mg (02/21/20 0507)   PRN Meds:.acetaminophen **OR** acetaminophen, morphine injection, ondansetron **OR** ondansetron (ZOFRAN) IV, promethazine, traMADol  Antimicrobials: Anti-infectives (From admission, onward)    Start     Dose/Rate Route Frequency Ordered Stop   02/21/20 0600  vancomycin (VANCOCIN) IVPB 1000 mg/200 mL premix     Discontinue     1,000 mg 200 mL/hr over 60  Minutes Intravenous Every 24 hours 02/20/20 0621     02/20/20 1000  cefTRIAXone (ROCEPHIN) 2 g in sodium chloride 0.9 % 100 mL IVPB  Status:  Discontinued        2 g 200 mL/hr over 30 Minutes Intravenous Every 24 hours 02/19/20 1342 02/20/20 0616   02/20/20 1000  azithromycin (ZITHROMAX) 500 mg in sodium chloride 0.9 % 250 mL IVPB  Status:  Discontinued        500 mg 250 mL/hr over 60 Minutes Intravenous Every 24 hours 02/19/20 1342 02/20/20 1019   02/20/20 1000  cefTRIAXone (ROCEPHIN) 2 g in sodium chloride 0.9 % 100 mL IVPB  Status:  Discontinued        2 g 200 mL/hr over 30 Minutes Intravenous Every 24 hours 02/20/20 0913 02/21/20 0749   02/20/20 0645  vancomycin (VANCOREADY) IVPB 1250 mg/250 mL        1,250 mg 166.7 mL/hr over 90 Minutes Intravenous  Once 02/20/20 0621 02/20/20 0815   02/19/20 1015  cefTRIAXone (ROCEPHIN) 1 g in sodium chloride 0.9 % 100 mL IVPB        1 g 200 mL/hr over 30 Minutes Intravenous  Once 02/19/20 1008 02/19/20 1151   02/19/20 1015  azithromycin (ZITHROMAX) 500 mg in sodium chloride 0.9 % 250 mL IVPB        500 mg 250 mL/hr over 60 Minutes Intravenous  Once 02/19/20 1008 02/19/20 1324        I have personally reviewed the following labs and images: CBC: Recent Labs  Lab 02/19/20 0405 02/20/20 0240 02/21/20 0227  WBC 11.4* 17.9* 15.8*  NEUTROABS 9.8*  --   --   HGB 12.0 11.0* 10.1*  HCT 38.5 35.6* 32.2*  MCV 101.0* 100.3* 100.0  PLT 309 251 250   BMP &GFR Recent Labs  Lab 02/19/20 0405 02/19/20 1039 02/20/20 0240 02/21/20 0227  NA 138  --  136 140  K 4.3  --  3.7 3.8  CL 92*  --  87* 89*  CO2 30  --  37* 38*  GLUCOSE 205*  --  136* 129*  BUN 13  --  25* 43*  CREATININE 1.01*  --  1.45* 1.49*  CALCIUM 7.9*  --  8.3* 8.6*  MG  --  1.9 2.1 2.5*  PHOS  --   --   --  5.4*   Estimated Creatinine Clearance: 30.9 mL/min (A) (by C-G formula based on SCr of 1.49 mg/dL (H)). Liver & Pancreas: Recent Labs  Lab 02/19/20 0405 02/21/20 0227   AST 90*  --   ALT 34  --   ALKPHOS 130*  --   BILITOT 1.9*  --   PROT 6.1*  --   ALBUMIN 3.1* 2.9*   No results for input(s): LIPASE, AMYLASE in the last 168 hours. No results for input(s): AMMONIA in the last 168 hours. Diabetic: No results for  input(s): HGBA1C in the last 72 hours. No results for input(s): GLUCAP in the last 168 hours. Cardiac Enzymes: No results for input(s): CKTOTAL, CKMB, CKMBINDEX, TROPONINI in the last 168 hours. No results for input(s): PROBNP in the last 8760 hours. Coagulation Profile: No results for input(s): INR, PROTIME in the last 168 hours. Thyroid Function Tests: Recent Labs    02/19/20 1039  TSH 2.577   Lipid Profile: No results for input(s): CHOL, HDL, LDLCALC, TRIG, CHOLHDL, LDLDIRECT in the last 72 hours. Anemia Panel: No results for input(s): VITAMINB12, FOLATE, FERRITIN, TIBC, IRON, RETICCTPCT in the last 72 hours. Urine analysis:    Component Value Date/Time   COLORURINE YELLOW 07/07/2017 1625   APPEARANCEUR CLEAR 07/07/2017 1625   LABSPEC 1.025 07/07/2017 1625   PHURINE 6.0 07/07/2017 1625   GLUCOSEU NEGATIVE 07/07/2017 1625   HGBUR NEGATIVE 07/07/2017 1625   HGBUR negative 12/22/2007 1139   BILIRUBINUR SMALL (A) 07/07/2017 1625   KETONESUR NEGATIVE 07/07/2017 1625   UROBILINOGEN 0.2 07/07/2017 1625   NITRITE NEGATIVE 07/07/2017 1625   LEUKOCYTESUR NEGATIVE 07/07/2017 1625   Sepsis Labs: Invalid input(s): PROCALCITONIN, [place]  Microbiology: Recent Results (from the past 240 hour(s))  SARS Coronavirus 2 by RT PCR (hospital order, performed in [facility] hospital lab) Nasopharyngeal Nasopharyngeal Swab     Status: None   Collection Time: 02/19/20  5:27 AM   Specimen: Nasopharyngeal Swab  Result Value Ref Range Status   SARS Coronavirus 2 NEGATIVE NEGATIVE Final    Comment: (NOTE) SARS-CoV-2 target nucleic acids are NOT DETECTED.  The SARS-CoV-2 RNA is generally detectable in upper and lower respiratory specimens  during the acute phase of infection. The lowest concentration of SARS-CoV-2 viral copies this assay can detect is 250 copies / mL. A negative result does not preclude SARS-CoV-2 infection and should not be used as the sole basis for treatment or other patient management decisions.  A negative result may occur with improper specimen collection / handling, submission of specimen other than nasopharyngeal swab, presence of viral mutation(s) within the areas targeted by this assay, and inadequate number of viral copies (<250 copies / mL). A negative result must be combined with clinical observations, patient history, and epidemiological information.  Fact Sheet for Patients:   StrictlyIdeas.no  Fact Sheet for Healthcare Providers: BankingDealers.co.za  This test is not yet approved or  cleared by the [place] FDA and has been authorized for detection and/or diagnosis of SARS-CoV-2 by FDA under an Emergency Use Authorization (EUA).  This EUA will remain in effect (meaning this test can be used) for the duration of the COVID-19 declaration under Section 564(b)(1) of the Act, 21 U.S.C. section 360bbb-3(b)(1), unless the authorization is terminated or revoked sooner.  Performed at [facility], [place] [address], [place], [place] [address]   Culture, blood (routine x 2)     Status: Abnormal (Preliminary result)   Collection Time: 02/19/20 10:36 AM   Specimen: BLOOD RIGHT HAND  Result Value Ref Range Status   Specimen Description BLOOD RIGHT HAND  Final   Special Requests   Final    BOTTLES DRAWN AEROBIC AND ANAEROBIC Blood Culture adequate volume   Culture  Setup Time   Final    GRAM POSITIVE COCCI IN CLUSTERS IN BOTH AEROBIC AND ANAEROBIC BOTTLES CRITICAL VALUE NOTED.  VALUE IS CONSISTENT WITH PREVIOUSLY REPORTED AND CALLED VALUE.    Culture (A)  Final    STAPHYLOCOCCUS EPIDERMIDIS STAPHYLOCOCCUS HOMINIS CULTURE REINCUBATED FOR  BETTER GROWTH ISOLATING FOR SUSCEPTIBILITY  Performed at [facility], [place] [address], [place], [place] [address]    Report Status PENDING  Incomplete  Culture, blood (routine x 2)     Status: Abnormal (Preliminary result)   Collection Time: 02/19/20 10:48 AM   Specimen: BLOOD  Result Value Ref Range Status   Specimen Description BLOOD RIGHT ANTECUBITAL  Final   Special Requests   Final    BOTTLES DRAWN AEROBIC AND ANAEROBIC Blood Culture adequate volume   Culture  Setup Time   Final    GRAM POSITIVE COCCI IN CLUSTERS IN BOTH AEROBIC AND ANAEROBIC BOTTLES CRITICAL RESULT CALLED TO, READ BACK BY AND VERIFIED WITH: PHRMD V BRYK _0  02/20/20 BY S GEZAHEGN Performed at [facility], [place] [address], [place], [place] [address]    Culture (A)  Final    STAPHYLOCOCCUS EPIDERMIDIS STAPHYLOCOCCUS HOMINIS    Report Status PENDING  Incomplete  Blood Culture ID Panel (Reflexed)     Status: Abnormal   Collection Time: 02/19/20 10:48 AM  Result Value Ref Range Status   Enterococcus species NOT DETECTED NOT DETECTED Final   Listeria monocytogenes NOT DETECTED NOT DETECTED Final   Staphylococcus species DETECTED (A) NOT DETECTED Final    Comment: Methicillin (oxacillin) resistant coagulase negative staphylococcus. Possible blood culture contaminant (unless isolated from more than one blood culture draw or clinical case suggests pathogenicity). No antibiotic treatment is indicated for blood  culture contaminants. CRITICAL RESULT CALLED TO, READ BACK BY AND VERIFIED WITH: PHRMD V BRYK _1  02/20/20 BY S GEZAHEGN    Staphylococcus aureus (BCID) NOT DETECTED NOT DETECTED Final   Methicillin resistance DETECTED (A) NOT DETECTED Final    Comment: CRITICAL RESULT CALLED TO, READ BACK BY AND VERIFIED WITH: PHRMD V BRYK _2  02/20/20 BY S GEZAHEGN    Streptococcus species NOT DETECTED NOT DETECTED Final   Streptococcus agalactiae NOT DETECTED NOT DETECTED Final   Streptococcus pneumoniae  NOT DETECTED NOT DETECTED Final   Streptococcus pyogenes NOT DETECTED NOT DETECTED Final   Acinetobacter baumannii NOT DETECTED NOT DETECTED Final   Enterobacteriaceae species NOT DETECTED NOT DETECTED Final   Enterobacter cloacae complex NOT DETECTED NOT DETECTED Final   Escherichia coli NOT DETECTED NOT DETECTED Final   Klebsiella oxytoca NOT DETECTED NOT DETECTED Final   Klebsiella pneumoniae NOT DETECTED NOT DETECTED Final   Proteus species NOT DETECTED NOT DETECTED Final   Serratia marcescens NOT DETECTED NOT DETECTED Final   Haemophilus influenzae NOT DETECTED NOT DETECTED Final   Neisseria meningitidis NOT DETECTED NOT DETECTED Final   Pseudomonas aeruginosa NOT DETECTED NOT DETECTED Final   Candida albicans NOT DETECTED NOT DETECTED Final   Candida glabrata NOT DETECTED NOT DETECTED Final   Candida krusei NOT DETECTED NOT DETECTED Final   Candida parapsilosis NOT DETECTED NOT DETECTED Final   Candida tropicalis NOT DETECTED NOT DETECTED Final    Comment: Performed at [facility], [place] [address], [place], [place] [address]  MRSA PCR Screening     Status: None   Collection Time: 02/19/20  8:59 PM   Specimen: Nasopharyngeal  Result Value Ref Range Status   MRSA by PCR NEGATIVE NEGATIVE Final    Comment:        The GeneXpert MRSA Assay (FDA approved for NASAL specimens only), is one component of a comprehensive MRSA colonization surveillance program. It is not intended to diagnose MRSA infection nor to guide or monitor treatment for MRSA infections. Performed at [facility], [place] [address]  [place], [place] [address]   Culture, blood (Routine X 2) w Reflex to ID Panel     Status: None (Preliminary result)   Collection Time: 02/20/20 10:43 AM   Specimen: BLOOD LEFT ARM  Result Value Ref Range Status   Specimen Description BLOOD LEFT ARM  Final   Special Requests   Final    BOTTLES DRAWN AEROBIC ONLY Blood Culture adequate volume   Culture   Final    NO  GROWTH < 24 HOURS Performed at [facility], [place] [address] [place], [place] [address]    Report Status PENDING  Incomplete  Culture, blood (Routine X 2) w Reflex to ID Panel     Status: None (Preliminary result)   Collection Time: 02/20/20 10:48 AM   Specimen: BLOOD LEFT WRIST  Result Value Ref Range Status   Specimen Description BLOOD LEFT WRIST  Final   Special Requests   Final    BOTTLES DRAWN AEROBIC ONLY Blood Culture results may not be optimal due to an inadequate volume of blood received in culture bottles   Culture   Final    NO GROWTH < 24 HOURS Performed at [facility], [place] [address] [place], [place] [address]    Report Status PENDING  Incomplete    Radiology Studies: No results found.  45 minutes with more than 50% spent in reviewing records, counseling patient/family and coordinating care.   Dorien Mayotte T. [place]  If 7PM-7AM, please contact night-coverage [email or web address] Password [facility] 02/21/2020, 12:57 PM

## 2020-02-21 NOTE — Progress Notes (Signed)
02/21/20 1441  Assess: MEWS Score  BP 124/79  Pulse Rate (!) 136  ECG Heart Rate (!) 128  Resp 17  Assess: MEWS Score  MEWS Temp 0  MEWS Systolic 0  MEWS Pulse 2  MEWS RR 0  MEWS LOC 0  MEWS Score 2  MEWS Score Color Yellow  Assess: if the MEWS score is Yellow or Red  Were vital signs taken at a resting state? Yes  Focused Assessment Documented focused assessment  Early Detection of Sepsis Score *See Row Information* Medium  MEWS guidelines implemented *See Row Information* Yes  Notify: Provider  Provider Name/Title [place], [place]  Date Provider Notified 02/21/20  Time Provider Notified 1430  Notification Type Page  Notification Reason Other (Comment) (converted back into Afib)  Response Other (Comment) ([place] to assess Pt.)  Date of Provider Response 02/21/20  Time of Provider Response 1441  Notify: Rapid Response  Name of Rapid Response RN Notified Pecolia Ades, RN  Date Rapid Response Notified 02/21/20  Time Rapid Response Notified 2257    Pt converted into A-fib PA contacted

## 2020-02-21 NOTE — Progress Notes (Signed)
Patient ID: Linda Stevens, female   DOB: 09-Apr-1946, 74 y.o.   MRN: [id number]         [facility] for Infectious Disease  Date of Admission:  02/19/2020           Day 3 vancomycin ASSESSMENT: Staph epidermidis and staph hominis have been isolated from both sets of admission blood cultures.  I have asked the lab to do susceptibilities on all isolates to help determine if this is likely true bacteremia versus contamination of both sets.  I will continue vancomycin for now.  Repeat blood cultures are negative so far and there was no evidence of endocarditis on TTE.  PLAN: 1. Continue vancomycin  Principal Problem:   Positive blood cultures Active Problems:   Community acquired pneumonia   Congestive heart failure (CHF) (HCC)   Pulmonary artery hypertension (HCC)   Acute on chronic diastolic congestive heart failure (HCC)   Acute on chronic respiratory failure with hypoxia (HCC)   Pericardial effusion   Hepatic cirrhosis (HCC)   Pleural effusion, bilateral   New onset atrial fibrillation (HCC)   Scheduled Meds:  acidophilus  1 capsule Oral Daily   ambrisentan  10 mg Oral Daily   aspirin EC  81 mg Oral Daily   enoxaparin (LOVENOX) injection  40 mg Subcutaneous Q24H   fluticasone  2 spray Each Nare Daily   umeclidinium bromide  2 puff Inhalation Daily   And   fluticasone furoate-vilanterol  2 puff Inhalation Daily   guaiFENesin  600 mg Oral BID   methylPREDNISolone (SOLU-MEDROL) injection  60 mg Intravenous Q24H   tadalafil (PAH)  40 mg Oral Daily   Continuous Infusions:  amiodarone 30 mg/hr (02/21/20 0504)   vancomycin 1,000 mg (02/21/20 0507)   PRN Meds:.acetaminophen **OR** acetaminophen, morphine injection, ondansetron **OR** ondansetron (ZOFRAN) IV, promethazine, traMADol   SUBJECTIVE: She states that her breathing is still "labored".  She is still having intermittent left chest pain.  She says that morphine helps.  Review of Systems: Review  of Systems  Constitutional: Negative for chills, diaphoresis and fever.  Respiratory: Positive for shortness of breath. Negative for cough and sputum production.   Cardiovascular: Positive for chest pain.    Allergies  Allergen Reactions   Amoxicillin-Pot Clavulanate     REACTION: gi upset Has patient had a PCN reaction causing immediate rash, facial/tongue/throat swelling, SOB or lightheadedness with hypotension: No Has patient had a PCN reaction causing severe rash involving mucus membranes or skin necrosis: No Has patient had a PCN reaction that required hospitalization : No Has patient had a PCN reaction occurring within the last 10 years: No If all of the above answers are "NO", then may proceed with Cephalosporin use.    Cefdinir     REACTION: gi  upset   Moxifloxacin     REACTION: rash   Singulair [Montelukast Sodium]     Itching     OBJECTIVE: Vitals:   02/21/20 0319 02/21/20 0735 02/21/20 0800 02/21/20 1100  BP: (!) 110/51  116/60 118/72  Pulse: 65  68 68  Resp:   14 13  Temp: 97.6 F (36.4 C)  97.7 F (36.5 C) 97.7 F (36.5 C)  TempSrc: Oral  Oral Oral  SpO2: 100% 92% 95% 97%  Weight:      Height:       Body mass index is 24.6 kg/m.  Physical Exam Constitutional:      Comments: She is resting quietly in bed.  Cardiovascular:  Rate and Rhythm: Normal rate and regular rhythm.     Heart sounds: No murmur heard.   Pulmonary:     Effort: Pulmonary effort is normal.     Breath sounds: Normal breath sounds.  Psychiatric:        Mood and Affect: Mood normal.     Lab Results Lab Results  Component Value Date   WBC 15.8 (H) 02/21/2020   HGB 10.1 (L) 02/21/2020   HCT 32.2 (L) 02/21/2020   MCV 100.0 02/21/2020   PLT 250 02/21/2020    Lab Results  Component Value Date   CREATININE 1.49 (H) 02/21/2020   BUN 43 (H) 02/21/2020   NA 140 02/21/2020   K 3.8 02/21/2020   CL 89 (L) 02/21/2020   CO2 38 (H) 02/21/2020    Lab Results  Component  Value Date   ALT 34 02/19/2020   AST 90 (H) 02/19/2020   ALKPHOS 130 (H) 02/19/2020   BILITOT 1.9 (H) 02/19/2020     Microbiology: Recent Results (from the past 240 hour(s))  SARS Coronavirus 2 by RT PCR (hospital order, performed in [place] hospital lab) Nasopharyngeal Nasopharyngeal Swab     Status: None   Collection Time: 02/19/20  5:27 AM   Specimen: Nasopharyngeal Swab  Result Value Ref Range Status   SARS Coronavirus 2 NEGATIVE NEGATIVE Final    Comment: (NOTE) SARS-CoV-2 target nucleic acids are NOT DETECTED.  The SARS-CoV-2 RNA is generally detectable in upper and lower respiratory specimens during the acute phase of infection. The lowest concentration of SARS-CoV-2 viral copies this assay can detect is 250 copies / mL. A negative result does not preclude SARS-CoV-2 infection and should not be used as the sole basis for treatment or other patient management decisions.  A negative result may occur with improper specimen collection / handling, submission of specimen other than nasopharyngeal swab, presence of viral mutation(s) within the areas targeted by this assay, and inadequate number of viral copies (<250 copies / mL). A negative result must be combined with clinical observations, patient history, and epidemiological information.  Fact Sheet for Patients:   StrictlyIdeas.no  Fact Sheet for Healthcare Providers: BankingDealers.co.za  This test is not yet approved or  cleared by the [place] FDA and has been authorized for detection and/or diagnosis of SARS-CoV-2 by FDA under an Emergency Use Authorization (EUA).  This EUA will remain in effect (meaning this test can be used) for the duration of the COVID-19 declaration under Section 564(b)(1) of the Act, 21 U.S.C. section 360bbb-3(b)(1), unless the authorization is terminated or revoked sooner.  Performed at [facility], [place] [employer] [address]  [place], [place] [address]   Culture, blood (routine x 2)     Status: Abnormal (Preliminary result)   Collection Time: 02/19/20 10:36 AM   Specimen: BLOOD RIGHT HAND  Result Value Ref Range Status   Specimen Description BLOOD RIGHT HAND  Final   Special Requests   Final    BOTTLES DRAWN AEROBIC AND ANAEROBIC Blood Culture adequate volume   Culture  Setup Time   Final    GRAM POSITIVE COCCI IN CLUSTERS IN BOTH AEROBIC AND ANAEROBIC BOTTLES CRITICAL VALUE NOTED.  VALUE IS CONSISTENT WITH PREVIOUSLY REPORTED AND CALLED VALUE.    Culture (A)  Final    STAPHYLOCOCCUS EPIDERMIDIS STAPHYLOCOCCUS HOMINIS CULTURE REINCUBATED FOR BETTER GROWTH ISOLATING FOR SUSCEPTIBILITY Performed at [facility], [place] [address], [place], [place] [address]    Report Status PENDING  Incomplete  Culture,  blood (routine x 2)     Status: Abnormal (Preliminary result)   Collection Time: 02/19/20 10:48 AM   Specimen: BLOOD  Result Value Ref Range Status   Specimen Description BLOOD RIGHT ANTECUBITAL  Final   Special Requests   Final    BOTTLES DRAWN AEROBIC AND ANAEROBIC Blood Culture adequate volume   Culture  Setup Time   Final    GRAM POSITIVE COCCI IN CLUSTERS IN BOTH AEROBIC AND ANAEROBIC BOTTLES CRITICAL RESULT CALLED TO, READ BACK BY AND VERIFIED WITH: PHRMD V BRYK _0  02/20/20 BY S GEZAHEGN Performed at [facility], [place] [address] [place], [place] [address]    Culture (A)  Final    STAPHYLOCOCCUS EPIDERMIDIS STAPHYLOCOCCUS HOMINIS    Report Status PENDING  Incomplete  Blood Culture ID Panel (Reflexed)     Status: Abnormal   Collection Time: 02/19/20 10:48 AM  Result Value Ref Range Status   Enterococcus species NOT DETECTED NOT DETECTED Final   Listeria monocytogenes NOT DETECTED NOT DETECTED Final   Staphylococcus species DETECTED (A) NOT DETECTED Final    Comment: Methicillin (oxacillin) resistant coagulase negative staphylococcus. Possible blood culture contaminant (unless isolated  from more than one blood culture draw or clinical case suggests pathogenicity). No antibiotic treatment is indicated for blood  culture contaminants. CRITICAL RESULT CALLED TO, READ BACK BY AND VERIFIED WITH: PHRMD V BRYK _1  02/20/20 BY S GEZAHEGN    Staphylococcus aureus (BCID) NOT DETECTED NOT DETECTED Final   Methicillin resistance DETECTED (A) NOT DETECTED Final    Comment: CRITICAL RESULT CALLED TO, READ BACK BY AND VERIFIED WITH: PHRMD V BRYK _2  02/20/20 BY S GEZAHEGN    Streptococcus species NOT DETECTED NOT DETECTED Final   Streptococcus agalactiae NOT DETECTED NOT DETECTED Final   Streptococcus pneumoniae NOT DETECTED NOT DETECTED Final   Streptococcus pyogenes NOT DETECTED NOT DETECTED Final   Acinetobacter baumannii NOT DETECTED NOT DETECTED Final   Enterobacteriaceae species NOT DETECTED NOT DETECTED Final   Enterobacter cloacae complex NOT DETECTED NOT DETECTED Final   Escherichia coli NOT DETECTED NOT DETECTED Final   Klebsiella oxytoca NOT DETECTED NOT DETECTED Final   Klebsiella pneumoniae NOT DETECTED NOT DETECTED Final   Proteus species NOT DETECTED NOT DETECTED Final   Serratia marcescens NOT DETECTED NOT DETECTED Final   Haemophilus influenzae NOT DETECTED NOT DETECTED Final   Neisseria meningitidis NOT DETECTED NOT DETECTED Final   Pseudomonas aeruginosa NOT DETECTED NOT DETECTED Final   Candida albicans NOT DETECTED NOT DETECTED Final   Candida glabrata NOT DETECTED NOT DETECTED Final   Candida krusei NOT DETECTED NOT DETECTED Final   Candida parapsilosis NOT DETECTED NOT DETECTED Final   Candida tropicalis NOT DETECTED NOT DETECTED Final    Comment: Performed at [facility], [place]. [address], [place], [place] [address]  MRSA PCR Screening     Status: None   Collection Time: 02/19/20  8:59 PM   Specimen: Nasopharyngeal  Result Value Ref Range Status   MRSA by PCR NEGATIVE NEGATIVE Final    Comment:        The GeneXpert MRSA Assay (FDA approved for  NASAL specimens only), is one component of a comprehensive MRSA colonization surveillance program. It is not intended to diagnose MRSA infection nor to guide or monitor treatment for MRSA infections. Performed at [facility], [place] [address], [place], [place] [address]   Culture, blood (Routine X 2) w Reflex to ID Panel     Status: None (Preliminary  result)   Collection Time: 02/20/20 10:43 AM   Specimen: BLOOD LEFT ARM  Result Value Ref Range Status   Specimen Description BLOOD LEFT ARM  Final   Special Requests   Final    BOTTLES DRAWN AEROBIC ONLY Blood Culture adequate volume   Culture   Final    NO GROWTH < 24 HOURS Performed at [facility], [address], [place], [place] [address]    Report Status PENDING  Incomplete  Culture, blood (Routine X 2) w Reflex to ID Panel     Status: None (Preliminary result)   Collection Time: 02/20/20 10:48 AM   Specimen: BLOOD LEFT WRIST  Result Value Ref Range Status   Specimen Description BLOOD LEFT WRIST  Final   Special Requests   Final    BOTTLES DRAWN AEROBIC ONLY Blood Culture results may not be optimal due to an inadequate volume of blood received in culture bottles   Culture   Final    NO GROWTH < 24 HOURS Performed at [facility], [place] [address] [place], [place] [address]    Report Status PENDING  Incomplete    Michel Bickers, MD [facility] for Infect[facility] [place] [facility] [phone] pager   [phone] cell 02/21/2020, 11:08 AM

## 2020-02-22 DIAGNOSIS — B957 Other staphylococcus as the cause of diseases classified elsewhere: Secondary | ICD-10-CM

## 2020-02-22 DIAGNOSIS — Z7189 Other specified counseling: Secondary | ICD-10-CM

## 2020-02-22 DIAGNOSIS — R5381 Other malaise: Secondary | ICD-10-CM

## 2020-02-22 DIAGNOSIS — J90 Pleural effusion, not elsewhere classified: Secondary | ICD-10-CM

## 2020-02-22 DIAGNOSIS — D649 Anemia, unspecified: Secondary | ICD-10-CM

## 2020-02-22 DIAGNOSIS — E872 Acidosis: Secondary | ICD-10-CM

## 2020-02-22 DIAGNOSIS — J9621 Acute and chronic respiratory failure with hypoxia: Secondary | ICD-10-CM

## 2020-02-22 DIAGNOSIS — I2721 Secondary pulmonary arterial hypertension: Secondary | ICD-10-CM

## 2020-02-22 DIAGNOSIS — J189 Pneumonia, unspecified organism: Principal | ICD-10-CM

## 2020-02-22 DIAGNOSIS — R748 Abnormal levels of other serum enzymes: Secondary | ICD-10-CM

## 2020-02-22 LAB — RENAL FUNCTION PANEL
Albumin: 2.7 g/dL — ABNORMAL LOW (ref 3.5–5.0)
Anion gap: 11 (ref 5–15)
BUN: 54 mg/dL — ABNORMAL HIGH (ref 8–23)
CO2: 38 mmol/L — ABNORMAL HIGH (ref 22–32)
Calcium: 8.5 mg/dL — ABNORMAL LOW (ref 8.9–10.3)
Chloride: 90 mmol/L — ABNORMAL LOW (ref 98–111)
Creatinine, Ser: 1.39 mg/dL — ABNORMAL HIGH (ref 0.44–1.00)
GFR calc Af Amer: 44 mL/min — ABNORMAL LOW (ref >60)
GFR calc non Af Amer: 38 mL/min — ABNORMAL LOW (ref >60)
Glucose, Bld: 129 mg/dL — ABNORMAL HIGH (ref 70–99)
Phosphorus: 4.8 mg/dL — ABNORMAL HIGH (ref 2.5–4.6)
Potassium: 3.6 mmol/L (ref 3.5–5.1)
Sodium: 139 mmol/L (ref 135–145)

## 2020-02-22 LAB — CBC
HCT: 30.9 % — ABNORMAL LOW (ref 36.0–46.0)
Hemoglobin: 9.7 g/dL — ABNORMAL LOW (ref 12.0–15.0)
MCH: 31.2 pg (ref 26.0–34.0)
MCHC: 31.4 g/dL (ref 30.0–36.0)
MCV: 99.4 fL (ref 80.0–100.0)
Platelets: 234 10*3/uL (ref 150–400)
RBC: 3.11 MIL/uL — ABNORMAL LOW (ref 3.87–5.11)
RDW: 15.9 % — ABNORMAL HIGH (ref 11.5–15.5)
WBC: 11.2 10*3/uL — ABNORMAL HIGH (ref 4.0–10.5)
nRBC: 0.3 % — ABNORMAL HIGH (ref 0.0–0.2)

## 2020-02-22 LAB — ANCA TITERS
Atypical P-ANCA titer: <1:20 {titer}
C-ANCA: <1:20 {titer}
P-ANCA: <1:20 {titer}

## 2020-02-22 LAB — MPO/PR-3 (ANCA) ANTIBODIES
ANCA Proteinase 3: <3.5 U/mL (ref 0.0–3.5)
Myeloperoxidase Abs: <9 U/mL (ref 0.0–9.0)

## 2020-02-22 LAB — MAGNESIUM: Magnesium: 2.7 mg/dL — ABNORMAL HIGH (ref 1.7–2.4)

## 2020-02-22 MED ORDER — FUROSEMIDE 10 MG/ML IJ SOLN: 40.0000 mg | Freq: Once | INTRAMUSCULAR | Status: DC

## 2020-02-22 MED ORDER — COLCHICINE 0.3 MG HALF TABLET
0.3000 mg | ORAL_TABLET | Freq: Every day | ORAL | Status: DC
  Administered 2020-02-22 – 2020-02-27 (×6): 0.3 mg via ORAL
  Filled 2020-02-22 (×6): qty 1

## 2020-02-22 NOTE — Progress Notes (Addendum)
Advanced Heart Failure Rounding Note  PCP-Cardiologist: [name], MD   Subjective:    Repeat Echo yesterday showed pericardial effusion to be a bit smaller but still large. Hemodynamically stable.   Back in NSR today after rebolus of IV amio. Remains on gtt at 30 mg/hr. HR 60s-70s.     Remains on [place] 3L/min.   Appetite is improving. Feeling a bit better.    Objective:   Weight Range: 63 kg Body mass index is 24.6 kg/m.   Vital Signs:   Temp:  [97.5 F (36.4 C)-98.6 F (37 C)] 97.5 F (36.4 C) (06/16 0733) Pulse Rate:  [63-136] 63 (06/16 0733) Resp:  [13-18] 17 (06/16 0733) BP: (91-135)/(47-79) 104/55 (06/16 0733) SpO2:  [87 %-98 %] 95 % (06/16 0733) Last BM Date: 02/18/20  Weight change: Filed Weights   02/19/20 0404 02/19/20 2055  Weight: 61.2 kg 63 kg    Intake/Output:   Intake/Output Summary (Last 24 hours) at 02/22/2020 0856 Last data filed at 02/22/2020 0535 Gross per 24 hour  Intake 1394.9 ml  Output 150 ml  Net 1244.9 ml      Physical Exam    General:  Thin chronically ill appearing WF.  No resp difficulty HEENT: Normal Neck: Supple. JVP ~10 cm . Carotids 2+ bilat; no bruits. No lymphadenopathy or thyromegaly appreciated. Cor: PMI nondisplaced. Regular rate & rhythm. No rubs, gallops or murmurs. Lungs: bibasilar crackles, L>R  Abdomen: Soft, nontender, nondistended. No hepatosplenomegaly. No bruits or masses. Good bowel sounds. Extremities: No cyanosis, clubbing, rash, edema Neuro: Alert & orientedx3, cranial nerves grossly intact. moves all 4 extremities w/o difficulty. Affect pleasant   Telemetry   NSR, 60s-70s currently. PAF this admit.   EKG    No new EKG to review today   Labs    CBC Recent Labs    02/21/20 0227 02/22/20 0259  WBC 15.8* 11.2*  HGB 10.1* 9.7*  HCT 32.2* 30.9*  MCV 100.0 99.4  PLT 250 914   Basic Metabolic Panel Recent Labs    02/21/20 0227 02/22/20 0259  NA 140 139  K 3.8 3.6  CL 89* 90*    CO2 38* 38*  GLUCOSE 129* 129*  BUN 43* 54*  CREATININE 1.49* 1.39*  CALCIUM 8.6* 8.5*  MG 2.5* 2.7*  PHOS 5.4* 4.8*   Liver Function Tests Recent Labs    02/21/20 0227 02/22/20 0259  ALBUMIN 2.9* 2.7*   No results for input(s): LIPASE, AMYLASE in the last 72 hours. Cardiac Enzymes No results for input(s): CKTOTAL, CKMB, CKMBINDEX, TROPONINI in the last 72 hours.  BNP: BNP (last 3 results) Recent Labs    05/12/19 1224 12/09/19 1239 02/19/20 0405  BNP 272.8* 324.2* 164.0*    ProBNP (last 3 results) No results for input(s): PROBNP in the last 8760 hours.   D-Dimer No results for input(s): DDIMER in the last 72 hours. Hemoglobin A1C No results for input(s): HGBA1C in the last 72 hours. Fasting Lipid Panel No results for input(s): CHOL, HDL, LDLCALC, TRIG, CHOLHDL, LDLDIRECT in the last 72 hours. Thyroid Function Tests Recent Labs    02/19/20 1039  TSH 2.577    Other results:   Imaging    ECHOCARDIOGRAM LIMITED  Result Date: 02/21/2020    ECHOCARDIOGRAM LIMITED REPORT   Patient Name:   FLOIS MCTAGUE Date of Exam: 02/21/2020 Medical Rec #:  [id number]           Height:       63.0 in Accession #:  [id number]          Weight:       138.9 lb Date of Birth:  1945/11/08           BSA:          1.656 m Patient Age:    74 years            BP:           124/79 mmHg Patient Gender: F                   HR:           146 bpm. Exam Location:  Inpatient Procedure: Limited Echo, Limited Color Doppler and Cardiac Doppler Indications:    Pericardial Effusion I31.3  History:        Patient has prior history of Echocardiogram examinations, most                 recent 02/19/2020. CHF; COPD and Pulmonary HTN.  Sonographer:    Mikki Santee RDCS (AE) Referring Phys: [phone] AMY D CLEGG IMPRESSIONS  1. Left ventricular ejection fraction, by estimation, is >75%. The left ventricle has hyperdynamic function. The left ventricle has no regional wall motion abnormalities. Left  ventricular diastolic function could not be evaluated.  2. Right ventricular systolic function is normal. The right ventricular size is normal. Mildly increased right ventricular wall thickness. Tricuspid regurgitation signal is inadequate for assessing PA pressure.  3. Large pericardial effusion. The pericardial effusion is circumferential.  4. The mitral valve is normal in structure. No evidence of mitral valve regurgitation.  5. The inferior vena cava is dilated in size with <50% respiratory variability, suggesting right atrial pressure of 15 mmHg. Comparison(s): Prior images reviewed side by side. Atrial fibrillation is new (and limits the evaluation of ventricular interdependence based on mitral inflow respiratory variation). Otherwise there are no changes: the pericardial effusion is large and the inferior vena cava is plethoric, but there is no diastolic chamber collapse to support a diagnosis of tamponade. FINDINGS  Left Ventricle: Left ventricular ejection fraction, by estimation, is >75%. The left ventricle has hyperdynamic function. The left ventricle has no regional wall motion abnormalities. The left ventricular internal cavity size was normal in size. There is no left ventricular hypertrophy. Left ventricular diastolic function could not be evaluated due to atrial fibrillation. Right Ventricle: The right ventricular size is normal. Mildly increased right ventricular wall thickness. Right ventricular systolic function is normal. Tricuspid regurgitation signal is inadequate for assessing PA pressure. Left Atrium: Left atrial size was normal in size. Right Atrium: Right atrial size was normal in size. Pericardium: A large pericardial effusion is present. The pericardial effusion is circumferential. Mitral Valve: The mitral valve is normal in structure. Tricuspid Valve: The tricuspid valve is normal in structure. Tricuspid valve regurgitation is not demonstrated. Pulmonic Valve: The pulmonic valve was not  well visualized. Venous: The inferior vena cava is dilated in size with less than 50% respiratory variability, suggesting right atrial pressure of 15 mmHg. Sanda Klein MD Electronically signed by Sanda Klein MD Signature Date/Time: 02/21/2020/3:47:15 PM    Final      Medications:     Scheduled Medications: . acidophilus  1 capsule Oral Daily  . ambrisentan  10 mg Oral Daily  . aspirin EC  81 mg Oral Daily  . enoxaparin (LOVENOX) injection  40 mg Subcutaneous Q24H  . fluticasone  2 spray Each Nare Daily  . umeclidinium bromide  2 puff Inhalation  Daily   And  . fluticasone furoate-vilanterol  2 puff Inhalation Daily  . guaiFENesin  600 mg Oral BID  . methylPREDNISolone (SOLU-MEDROL) injection  60 mg Intravenous Q24H  . tadalafil (PAH)  40 mg Oral Daily    Infusions: . amiodarone 30 mg/hr (02/22/20 0535)  . vancomycin 1,000 mg (02/22/20 0625)    PRN Medications: acetaminophen **OR** acetaminophen, morphine injection, ondansetron **OR** ondansetron (ZOFRAN) IV, promethazine, traMADol   Assessment/Plan   1.  A/C Hypoxic Respiratory Failure/ possible pneumonia / AECOPD?  Oxygen requirement on admit. O2 increased to 6 liters. Now back down on 3 liters.  Sats stable.  - Lactic Acid 3.4>2.2>2.1  - WBC 17.9>15.8 >11.2 - Sed Rate 63.  - Initial Blood CX + for Staph epidermidis and staph hominis ? Contaminates.  - Blood CX repeated 6/14. NGTD - Rocephin stopped 6/14.  - On Vancomycin  + solumedrol  - ID following  2. Pericardial Effusion  - Large pericardial effusion on ECHO and CTA.  - Repeat ECHO  6/15: Pericardial effusion seems a bit smaller but still large.  - hemodynamically stable currently. No indication for pericardiocentesis or window currently. Plan repeat echo on 6/18 - Rheum serologies remain surprisingly negative. Need to consider other etiologies for effusion and pleuritic CP (? Infectious/viral). Possible inflammatory process. ESR elevated at 63 - ID  following    3. Pulmonary HTN - On sildenafil + letaris , continue - On ECHO PA pressure 87 mm hg    4. Chronic Diastolic HF  - ECHO this admit preserved LVEF , Grade IDD - Volume status ok. Continue to hold diuretics for now - Watch closely with addition of steroids.  - Will order daily wts   5. PAF -Started on amio drip 6/14- converted to NSR.  -Reverted back to Afib w/ RVR 6/15. Converted back to NSR w/ amio bolus - NSR currently, HR 60s - Continue amiodarone gtt at 30 mg/hr   -No AC at this time with risk of hemorrhagic transformation of effusion.    6. AKI  Creatinine trend  1>1.4 >1.5>1.4  - continue to follow BMP     Length of Stay: [address], PA-C  02/22/2020, 8:56 AM  Advanced Heart Failure Team Pager [phone] (M-F; 7a - 4p)  Please contact [facility] for night-coverage after hours (4p -7a ) and weekends on [email or web address]   Patient seen and examined with the above-signed Advanced Practice Provider and/or Housestaff. I personally reviewed laboratory data, imaging studies and relevant notes. I independently examined the patient and formulated the important aspects of the plan. I have edited the note to reflect any of my changes or salient points. I have personally discussed the plan with the patient and/or family.  Continues to improve on steroids and abx. Remains in NSR on IV amio. Breathing better. HR in 60s. Effusion smaller on echo.   General:  Well appearing. No resp difficulty HEENT: normal Neck: supple. JVP 10 Carotids 2+ bilat; no bruits. No lymphadenopathy or thryomegaly appreciated. Cor: PMI nondisplaced. Regular rate & rhythm. No rubs, gallops or murmurs. Lungs: decreased throughout Abdomen: soft, nontender, nondistended. No hepatosplenomegaly. No bruits or masses. Good bowel sounds. Extremities: no cyanosis, clubbing, rash, edema Neuro: alert & orientedx3, cranial nerves grossly intact. moves all 4 extremities w/o difficulty. Affect  pleasant  Making slow progress. Will continue high-dose steroids and abx. Auto-immune w/u negative. Will check viral panel. Continue amio. No AC with large pericardial effusion. Ambulate. Repeat echo on Friday. Can add colchicine.  [name], MD  11:19 AM

## 2020-02-22 NOTE — Progress Notes (Signed)
   02/22/20 1845  Assess: MEWS Score  BP 120/71  ECG Heart Rate (!) 128  Level of Consciousness Alert  SpO2 94 %  O2 Device Nasal Cannula  Patient Activity (if Appropriate) In bed  O2 Flow Rate (L/min) 4 L/min  Assess: MEWS Score  MEWS Temp 0  MEWS Systolic 0  MEWS Pulse 2  MEWS RR 1  MEWS LOC 0  MEWS Score 3  MEWS Score Color Yellow  Assess: if the MEWS score is Yellow or Red  Were vital signs taken at a resting state? Yes  Focused Assessment Documented focused assessment  Early Detection of Sepsis Score *See Row Information* Low  MEWS guidelines implemented *See Row Information* Yes  Treat  MEWS Interventions Administered scheduled meds/treatments  Take Vital Signs  Increase Vital Sign Frequency  Yellow: Q 2hr X 2 then Q 4hr X 2, if remains yellow, continue Q 4hrs  Escalate  MEWS: Escalate Yellow: discuss with charge nurse/RN and consider discussing with provider and RRT  Notify: Charge Nurse/RN  Name of Charge Nurse/RN Notified Theme park manager  Date Charge Nurse/RN Notified 02/22/20  Time Charge Nurse/RN Notified [place]   Notified charge RN, will check VS Q2 x2, BP stable 120/71, will notify MD of increasing HR w/ afib. Pt stable at this time, will continue to monitor.

## 2020-02-22 NOTE — Progress Notes (Signed)
PROGRESS NOTE  Linda Stevens PJR:[id number] DOB: June 18, 1946   PCP: [name], MD  Patient is from: Home.  DOA: 02/19/2020 LOS: 3  Brief Narrative / Interim history: 74 year old female with history of COPD/chronic RF on 3 L, diastolic CHF, pulmonary HTN, HTN and HLD presenting with progressive shortness of breath for 1 week.  Tried with a course of Z-Pak and a steroid taper by her pulmonologist on 6/9 but continues to have progressive shortness of breath.  In ED, slightly hypotensive.  Required 6 L by [place].  WBC 11.4.  AST 90.  Total bili 1.9.  Lactic acid 2.8.  BNP 164.  Trop negative x2.  CXR concerning for cardiomegaly with small pleural effusion and by basilar atelectasis.  CTA chest showed sizable pericardial effusion, bilateral pleural effusions and consolidation concerning for pneumonia and pulmonary HTN.  Cultures drawn.  Received IV Solu-Medrol, morphine, Rocephin and azithromycin,and admitted for acute on chronic respiratory failure due to pneumonia, pleural effusions and pericardial effusion.  Cardiology consulted for pericardial effusion.  Echo obtained and revealed dilated and fixed IVC although no other features of tamponade.  The next day, blood cultures with GPC's in clusters in 4/4.  BICD with coag negative MR staph.  Vancomycin added.  ID consulted.  Azithromycin and ceftriaxone discontinued.  Repeat blood culture obtained and NGTD.    Patient went into A. fib with RVR with hypotension on 6/14.  Started on amiodarone drip and converted to sinus rhythm.  Advanced heart failure team involved as well.  Subjective: Seen and examined earlier this morning.  She went into RVR yesterday but back to NSR after amnio bolus.  Still on IV amiodarone.  Feels a little better from breathing standpoint.  No further chest pain.  Objective: Vitals:   02/21/20 1935 02/21/20 2330 02/22/20 0300 02/22/20 0733  BP: (!) 116/51 (!) 112/50 (!) 124/51 (!) 104/55  Pulse: 69 70 69 63  Resp:  _0 Temp: 98.3 F (36.8 C) 97.8 F (36.6 C) 98 F (36.7 C) (!) 97.5 F (36.4 C)  TempSrc: Oral Oral Oral Oral  SpO2: 91% 94% 94% 95%  Weight:      Height:        Intake/Output Summary (Last 24 hours) at 02/22/2020 1116 Last data filed at 02/22/2020 0535 Gross per 24 hour  Intake 1394.9 ml  Output 150 ml  Net 1244.9 ml   Filed Weights   02/19/20 0404 02/19/20 2055  Weight: 61.2 kg 63 kg    Examination:.  GENERAL: Chronically ill-appearing.  No apparent distress.  Nontoxic. HEENT: MMM.  Vision and hearing grossly intact.  NECK: Supple.  Significant JVD sitting upright. RESP: 90% on 4 L.  No IWOB.  Fair aeration.  Bibasilar crackles CVS:  RRR. Heart sounds normal.  ABD/GI/GU: BS+. Abd soft, NTND.  MSK/EXT:  Moves extremities. No apparent deformity.  Trace edema. SKIN: no apparent skin lesion or wound NEURO: Awake, alert and oriented appropriately.  No apparent focal neuro deficit. PSYCH: Calm. Normal affect.   Procedures:  None  Microbiology summarized: COVID-19 PCR negative. MRSA PCR negative. Blood cultures with GPC's in clusters in 4/4. BICD with coag negative methicillin-resistant staph  Assessment & Plan: Coag negative MR staph bacteremia-initial blood cultures with methicillin-resistant Streptococcus epidermidis and homnis.   Repeat blood culture on 6/14 NGTD.  TTE without vegetation.  Unclear.  Initial blood culture could be contamination.  Leukocytosis improving. -Appreciate ID guidance -Continue vancomycin 6/14>> -Ceftriaxone and azithromycin discontinued.   Community-acquired  pneumonia: CTA concerning for bibasilar consolidation with pleural effusion. -Antibiotics as above -Incentive spirometry and flutter valve  Acute on chronic respiratory failure with hypoxia: Multifactorial including infectious process, pericardiac and pleural effusion, pulmonary hypertension, underlying COPD and A. fib with RVR.  On 3 L at baseline. -Wean oxygen as able-minimum  oxygen to keep saturation above 90% -Treat potential etiologies  Pericardial effusion: Reported as sizable on initial Echo that also revealed dilated and fixated IVC but no other features of tamponade.  Repeat echo repeatedly with slightly small pericardial effusion.  She has prominent JVD on exam.  ESR 63.  Rheumatologic exam negative so far. -Cardiology managing. -IV antibiotics as above.  Pleural effusion: Looks a small on review of CTA chest -Continue antibiotics as above  Pulmonary hypertension: Felt to be secondary in the setting of underlying lung disease.  PAPP 87 mmHg. -Advanced heart failure team following. -On sildenafil and ambrisentan   A. fib with RVR: Noted to have A. fib on EKG obtained by EMS.  Went into RVR on 6/14 and 6/15.  Converted to sinus rhythm with amiodarone drip. -Amiodarone per cardiology -Defer anticoagulation to cardiology. -Optimize Mg and K.  Chronic diastolic CHF: Echo with preserved EF and G1 DD.  BNP lower than baseline.  Reports good urine output although not captured.  Creatinine up trended.  Diuretics on hold per cardiology -Monitor fluid status, renal function and electrolytes -Defer diuretics to cardiology.  AKI/azotemia: Baseline Cr 0.8-0.9> 1.01 (admit)> > 1.49> 1.39.  BUN 43>54.  Likely hemodynamically mediated. -Continue monitoring -Avoid nephrotoxic meds  Metabolic alkalosis: Could be due to contraction alkalosis. -Diuretics on hold -Continue monitoring.  Hyperphosphatemia: Likely due to renal failure. -Continue monitoring  Essential hypertension: Soft blood pressures. -Continue monitoring  Elevated liver enzymes/hyperbilirubinemia: Likely congestive hepatopathy. -Continue monitoring.  Normocytic anemia: Baseline Hgb 11-12> 12.0 (admit)>>> 9.7.  Suspect hemodilution -Continue monitoring  Acute on chronic COPD -IV Solu-Medrol 60 mg daily, Trelegy Ellipta  Liver cirrhosis-due to pulmonary hypertension/heart  failure?  Elevated AST/hyperbilirubinemia-likely due to bacteremia and possible congestive etiology -Continue trending  Debility/physical deconditioning -PT/OT  Goal of care-significant cardiopulmonary comorbidities as above.  Prognosis is not great.  She is a still full code.  This would probably pose more harm than benefit. -Palliative care consulted.              DVT prophylaxis:  enoxaparin (LOVENOX) injection 40 mg Start: 02/19/20 1800  Code Status: Full code Family Communication: Updated patient's husband at bedside Status is: Inpatient  Remains inpatient appropriate because:Hemodynamically unstable, Ongoing diagnostic testing needed not appropriate for outpatient work up, IV treatments appropriate due to intensity of illness or inability to take PO and Inpatient level of care appropriate due to severity of illness   Dispo: The patient is from: Home              Anticipated d/c is to:  To be determined              Anticipated d/c date is: > 3 days              Patient currently is not medically stable to d/c.       Consultants:  Cardiology Advanced heart failure team Infectious disease Palliative care medicine   Sch Meds:  Scheduled Meds: . acidophilus  1 capsule Oral Daily  . ambrisentan  10 mg Oral Daily  . aspirin EC  81 mg Oral Daily  . enoxaparin (LOVENOX) injection  40 mg Subcutaneous Q24H  . fluticasone  2 spray Each Nare Daily  . umeclidinium bromide  2 puff Inhalation Daily   And  . fluticasone furoate-vilanterol  2 puff Inhalation Daily  . guaiFENesin  600 mg Oral BID  . methylPREDNISolone (SOLU-MEDROL) injection  60 mg Intravenous Q24H  . tadalafil (PAH)  40 mg Oral Daily   Continuous Infusions: . amiodarone 30 mg/hr (02/22/20 1043)  . vancomycin 1,000 mg (02/22/20 0625)   PRN Meds:.acetaminophen **OR** acetaminophen, morphine injection, ondansetron **OR** ondansetron (ZOFRAN) IV, promethazine,  traMADol  Antimicrobials: Anti-infectives (From admission, onward)   Start     Dose/Rate Route Frequency Ordered Stop   02/21/20 0600  vancomycin (VANCOCIN) IVPB 1000 mg/200 mL premix     Discontinue     1,000 mg 200 mL/hr over 60 Minutes Intravenous Every 24 hours 02/20/20 0621     02/20/20 1000  cefTRIAXone (ROCEPHIN) 2 g in sodium chloride 0.9 % 100 mL IVPB  Status:  Discontinued        2 g 200 mL/hr over 30 Minutes Intravenous Every 24 hours 02/19/20 1342 02/20/20 0616   02/20/20 1000  azithromycin (ZITHROMAX) 500 mg in sodium chloride 0.9 % 250 mL IVPB  Status:  Discontinued        500 mg 250 mL/hr over 60 Minutes Intravenous Every 24 hours 02/19/20 1342 02/20/20 1019   02/20/20 1000  cefTRIAXone (ROCEPHIN) 2 g in sodium chloride 0.9 % 100 mL IVPB  Status:  Discontinued        2 g 200 mL/hr over 30 Minutes Intravenous Every 24 hours 02/20/20 0913 02/21/20 0749   02/20/20 0645  vancomycin (VANCOREADY) IVPB 1250 mg/250 mL        1,250 mg 166.7 mL/hr over 90 Minutes Intravenous  Once 02/20/20 0621 02/21/20 1649   02/19/20 1015  cefTRIAXone (ROCEPHIN) 1 g in sodium chloride 0.9 % 100 mL IVPB        1 g 200 mL/hr over 30 Minutes Intravenous  Once 02/19/20 1008 02/19/20 1151   02/19/20 1015  azithromycin (ZITHROMAX) 500 mg in sodium chloride 0.9 % 250 mL IVPB        500 mg 250 mL/hr over 60 Minutes Intravenous  Once 02/19/20 1008 02/19/20 1324       I have personally reviewed the following labs and images: CBC: Recent Labs  Lab 02/19/20 0405 02/20/20 0240 02/21/20 0227 02/22/20 0259  WBC 11.4* 17.9* 15.8* 11.2*  NEUTROABS 9.8*  --   --   --   HGB 12.0 11.0* 10.1* 9.7*  HCT 38.5 35.6* 32.2* 30.9*  MCV 101.0* 100.3* 100.0 99.4  PLT 309 251 250 234   BMP &GFR Recent Labs  Lab 02/19/20 0405 02/19/20 1039 02/20/20 0240 02/21/20 0227 02/22/20 0259  NA 138  --  136 140 139  K 4.3  --  3.7 3.8 3.6  CL 92*  --  87* 89* 90*  CO2 30  --  37* 38* 38*  GLUCOSE 205*  --   136* 129* 129*  BUN 13  --  25* 43* 54*  CREATININE 1.01*  --  1.45* 1.49* 1.39*  CALCIUM 7.9*  --  8.3* 8.6* 8.5*  MG  --  1.9 2.1 2.5* 2.7*  PHOS  --   --   --  5.4* 4.8*   Estimated Creatinine Clearance: 33.2 mL/min (A) (by C-G formula based on SCr of 1.39 mg/dL (H)). Liver & Pancreas: Recent Labs  Lab 02/19/20 0405 02/21/20 0227 02/22/20 0259  AST 90*  --   --  ALT 34  --   --   ALKPHOS 130*  --   --   BILITOT 1.9*  --   --   PROT 6.1*  --   --   ALBUMIN 3.1* 2.9* 2.7*   No results for input(s): LIPASE, AMYLASE in the last 168 hours. No results for input(s): AMMONIA in the last 168 hours. Diabetic: No results for input(s): HGBA1C in the last 72 hours. No results for input(s): GLUCAP in the last 168 hours. Cardiac Enzymes: No results for input(s): CKTOTAL, CKMB, CKMBINDEX, TROPONINI in the last 168 hours. No results for input(s): PROBNP in the last 8760 hours. Coagulation Profile: No results for input(s): INR, PROTIME in the last 168 hours. Thyroid Function Tests: No results for input(s): TSH, T4TOTAL, FREET4, T3FREE, THYROIDAB in the last 72 hours. Lipid Profile: No results for input(s): CHOL, HDL, LDLCALC, TRIG, CHOLHDL, LDLDIRECT in the last 72 hours. Anemia Panel: No results for input(s): VITAMINB12, FOLATE, FERRITIN, TIBC, IRON, RETICCTPCT in the last 72 hours. Urine analysis:    Component Value Date/Time   COLORURINE YELLOW 07/07/2017 1625   APPEARANCEUR CLEAR 07/07/2017 1625   LABSPEC 1.025 07/07/2017 1625   PHURINE 6.0 07/07/2017 1625   GLUCOSEU NEGATIVE 07/07/2017 1625   HGBUR NEGATIVE 07/07/2017 1625   HGBUR negative 12/22/2007 1139   BILIRUBINUR SMALL (A) 07/07/2017 1625   KETONESUR NEGATIVE 07/07/2017 1625   UROBILINOGEN 0.2 07/07/2017 1625   NITRITE NEGATIVE 07/07/2017 1625   LEUKOCYTESUR NEGATIVE 07/07/2017 1625   Sepsis Labs: Invalid input(s): PROCALCITONIN, [place]  Microbiology: Recent Results (from the past 240 hour(s))  SARS  Coronavirus 2 by RT PCR (hospital order, performed in [facility] hospital lab) Nasopharyngeal Nasopharyngeal Swab     Status: None   Collection Time: 02/19/20  5:27 AM   Specimen: Nasopharyngeal Swab  Result Value Ref Range Status   SARS Coronavirus 2 NEGATIVE NEGATIVE Final    Comment: (NOTE) SARS-CoV-2 target nucleic acids are NOT DETECTED.  The SARS-CoV-2 RNA is generally detectable in upper and lower respiratory specimens during the acute phase of infection. The lowest concentration of SARS-CoV-2 viral copies this assay can detect is 250 copies / mL. A negative result does not preclude SARS-CoV-2 infection and should not be used as the sole basis for treatment or other patient management decisions.  A negative result may occur with improper specimen collection / handling, submission of specimen other than nasopharyngeal swab, presence of viral mutation(s) within the areas targeted by this assay, and inadequate number of viral copies (<250 copies / mL). A negative result must be combined with clinical observations, patient history, and epidemiological information.  Fact Sheet for Patients:   StrictlyIdeas.no  Fact Sheet for Healthcare Providers: BankingDealers.co.za  This test is not yet approved or  cleared by the [place] FDA and has been authorized for detection and/or diagnosis of SARS-CoV-2 by FDA under an Emergency Use Authorization (EUA).  This EUA will remain in effect (meaning this test can be used) for the duration of the COVID-19 declaration under Section 564(b)(1) of the Act, 21 U.S.C. section 360bbb-3(b)(1), unless the authorization is terminated or revoked sooner.  Performed at [facility], [place] [address] [place], [place] [address]   Culture, blood (routine x 2)     Status: Abnormal (Preliminary result)   Collection Time: 02/19/20 10:36 AM   Specimen: BLOOD RIGHT HAND  Result Value Ref Range Status    Specimen Description BLOOD RIGHT HAND  Final   Special Requests   Final  BOTTLES DRAWN AEROBIC AND ANAEROBIC Blood Culture adequate volume   Culture  Setup Time   Final    GRAM POSITIVE COCCI IN CLUSTERS IN BOTH AEROBIC AND ANAEROBIC BOTTLES CRITICAL VALUE NOTED.  VALUE IS CONSISTENT WITH PREVIOUSLY REPORTED AND CALLED VALUE.    Culture (A)  Final    STAPHYLOCOCCUS EPIDERMIDIS STAPHYLOCOCCUS HOMINIS SUSCEPTIBILITIES TO FOLLOW Performed at [facility], [place] [address], [place], [place] [address]    Report Status PENDING  Incomplete  Culture, blood (routine x 2)     Status: Abnormal (Preliminary result)   Collection Time: 02/19/20 10:48 AM   Specimen: BLOOD  Result Value Ref Range Status   Specimen Description BLOOD RIGHT ANTECUBITAL  Final   Special Requests   Final    BOTTLES DRAWN AEROBIC AND ANAEROBIC Blood Culture adequate volume   Culture  Setup Time   Final    GRAM POSITIVE COCCI IN CLUSTERS IN BOTH AEROBIC AND ANAEROBIC BOTTLES CRITICAL RESULT CALLED TO, READ BACK BY AND VERIFIED WITH: PHRMD V BRYK _0  02/20/20 BY S GEZAHEGN    Culture (A)  Final    STAPHYLOCOCCUS EPIDERMIDIS STAPHYLOCOCCUS HOMINIS SUSCEPTIBILITIES TO FOLLOW Performed at [facility], [place] [address] [place], [place] [address]    Report Status PENDING  Incomplete  Blood Culture ID Panel (Reflexed)     Status: Abnormal   Collection Time: 02/19/20 10:48 AM  Result Value Ref Range Status   Enterococcus species NOT DETECTED NOT DETECTED Final   Listeria monocytogenes NOT DETECTED NOT DETECTED Final   Staphylococcus species DETECTED (A) NOT DETECTED Final    Comment: Methicillin (oxacillin) resistant coagulase negative staphylococcus. Possible blood culture contaminant (unless isolated from more than one blood culture draw or clinical case suggests pathogenicity). No antibiotic treatment is indicated for blood  culture contaminants. CRITICAL RESULT CALLED TO, READ BACK BY AND VERIFIED  WITH: PHRMD V BRYK _1  02/20/20 BY S GEZAHEGN    Staphylococcus aureus (BCID) NOT DETECTED NOT DETECTED Final   Methicillin resistance DETECTED (A) NOT DETECTED Final    Comment: CRITICAL RESULT CALLED TO, READ BACK BY AND VERIFIED WITH: PHRMD V BRYK _2  02/20/20 BY S GEZAHEGN    Streptococcus species NOT DETECTED NOT DETECTED Final   Streptococcus agalactiae NOT DETECTED NOT DETECTED Final   Streptococcus pneumoniae NOT DETECTED NOT DETECTED Final   Streptococcus pyogenes NOT DETECTED NOT DETECTED Final   Acinetobacter baumannii NOT DETECTED NOT DETECTED Final   Enterobacteriaceae species NOT DETECTED NOT DETECTED Final   Enterobacter cloacae complex NOT DETECTED NOT DETECTED Final   Escherichia coli NOT DETECTED NOT DETECTED Final   Klebsiella oxytoca NOT DETECTED NOT DETECTED Final   Klebsiella pneumoniae NOT DETECTED NOT DETECTED Final   Proteus species NOT DETECTED NOT DETECTED Final   Serratia marcescens NOT DETECTED NOT DETECTED Final   Haemophilus influenzae NOT DETECTED NOT DETECTED Final   Neisseria meningitidis NOT DETECTED NOT DETECTED Final   Pseudomonas aeruginosa NOT DETECTED NOT DETECTED Final   Candida albicans NOT DETECTED NOT DETECTED Final   Candida glabrata NOT DETECTED NOT DETECTED Final   Candida krusei NOT DETECTED NOT DETECTED Final   Candida parapsilosis NOT DETECTED NOT DETECTED Final   Candida tropicalis NOT DETECTED NOT DETECTED Final    Comment: Performed at [facility], [place]. [address] [place], [place] [address]  MRSA PCR Screening     Status: None   Collection Time: 02/19/20  8:59 PM   Specimen: Nasopharyngeal  Result Value Ref Range Status  MRSA by PCR NEGATIVE NEGATIVE Final    Comment:        The GeneXpert MRSA Assay (FDA approved for NASAL specimens only), is one component of a comprehensive MRSA colonization surveillance program. It is not intended to diagnose MRSA infection nor to guide or monitor treatment for MRSA  infections. Performed at [facility], [place] [address] [place], [place] [address]   Culture, blood (Routine X 2) w Reflex to ID Panel     Status: None (Preliminary result)   Collection Time: 02/20/20 10:43 AM   Specimen: BLOOD LEFT ARM  Result Value Ref Range Status   Specimen Description BLOOD LEFT ARM  Final   Special Requests   Final    BOTTLES DRAWN AEROBIC ONLY Blood Culture adequate volume   Culture   Final    NO GROWTH < 24 HOURS Performed at [facility], [place] [address] [place], [place] [address]    Report Status PENDING  Incomplete  Culture, blood (Routine X 2) w Reflex to ID Panel     Status: None (Preliminary result)   Collection Time: 02/20/20 10:48 AM   Specimen: BLOOD LEFT WRIST  Result Value Ref Range Status   Specimen Description BLOOD LEFT WRIST  Final   Special Requests   Final    BOTTLES DRAWN AEROBIC ONLY Blood Culture results may not be optimal due to an inadequate volume of blood received in culture bottles   Culture   Final    NO GROWTH < 24 HOURS Performed at [facility], [place] [address], [place], [place] [address]    Report Status PENDING  Incomplete    Radiology Studies: ECHOCARDIOGRAM LIMITED  Result Date: 02/21/2020    ECHOCARDIOGRAM LIMITED REPORT   Patient Name:   CORAL TIMME Date of Exam: 02/21/2020 Medical Rec #:  [id number]           Height:       63.0 in Accession #:    [id number]          Weight:       138.9 lb Date of Birth:  May 30, 1946           BSA:          1.656 m Patient Age:    59 years            BP:           124/79 mmHg Patient Gender: F                   HR:           146 bpm. Exam Location:  Inpatient Procedure: Limited Echo, Limited Color Doppler and Cardiac Doppler Indications:    Pericardial Effusion I31.3  History:        Patient has prior history of Echocardiogram examinations, most                 recent 02/19/2020. CHF; COPD and Pulmonary HTN.  Sonographer:    Mikki Santee RDCS (AE) Referring Phys:  [phone] AMY D CLEGG IMPRESSIONS  1. Left ventricular ejection fraction, by estimation, is >75%. The left ventricle has hyperdynamic function. The left ventricle has no regional wall motion abnormalities. Left ventricular diastolic function could not be evaluated.  2. Right ventricular systolic function is normal. The right ventricular size is normal. Mildly increased right ventricular wall thickness. Tricuspid regurgitation signal is inadequate for assessing PA pressure.  3. Large pericardial effusion. The pericardial effusion  is circumferential.  4. The mitral valve is normal in structure. No evidence of mitral valve regurgitation.  5. The inferior vena cava is dilated in size with <50% respiratory variability, suggesting right atrial pressure of 15 mmHg. Comparison(s): Prior images reviewed side by side. Atrial fibrillation is new (and limits the evaluation of ventricular interdependence based on mitral inflow respiratory variation). Otherwise there are no changes: the pericardial effusion is large and the inferior vena cava is plethoric, but there is no diastolic chamber collapse to support a diagnosis of tamponade. FINDINGS  Left Ventricle: Left ventricular ejection fraction, by estimation, is >75%. The left ventricle has hyperdynamic function. The left ventricle has no regional wall motion abnormalities. The left ventricular internal cavity size was normal in size. There is no left ventricular hypertrophy. Left ventricular diastolic function could not be evaluated due to atrial fibrillation. Right Ventricle: The right ventricular size is normal. Mildly increased right ventricular wall thickness. Right ventricular systolic function is normal. Tricuspid regurgitation signal is inadequate for assessing PA pressure. Left Atrium: Left atrial size was normal in size. Right Atrium: Right atrial size was normal in size. Pericardium: A large pericardial effusion is present. The pericardial effusion is circumferential.  Mitral Valve: The mitral valve is normal in structure. Tricuspid Valve: The tricuspid valve is normal in structure. Tricuspid valve regurgitation is not demonstrated. Pulmonic Valve: The pulmonic valve was not well visualized. Venous: The inferior vena cava is dilated in size with less than 50% respiratory variability, suggesting right atrial pressure of 15 mmHg. Sanda Klein MD Electronically signed by Sanda Klein MD Signature Date/Time: 02/21/2020/3:47:15 PM    Final      Charlesetta Ivory. [place]  If 7PM-7AM, please contact night-coverage [email or web address] Password [facility] 02/22/2020, 11:16 AM

## 2020-02-22 NOTE — TOC Benefit Eligibility Note (Signed)
Transition of Care (TOC) Benefit Eligibility Note    Patient Details  Name: Linda Stevens MRN: [id number] Date of Birth: 03/27/1946   Medication/Dose: COLCHICINE TABLET 0.3 MG DAILY  Covered?: Yes  Tier:  (NO TIER)  Prescription Coverage Preferred Pharmacy: [facility]  Spoke with Person/Company/Phone Number:: KEISHA  @  [employer] # [phone]  Co-Pay: $ 8.10  Prior Approval: No  Deductible: Met       [place], Dora Phone Number: 02/22/2020, 4:25 PM     

## 2020-02-22 NOTE — Evaluation (Signed)
Physical Therapy Evaluation Patient Details Name: Linda Stevens MRN: [id number] DOB: [birth year] Today's Date: 02/22/2020   History of Present Illness  Pt adm with acute hypoxic respiratory failure and large pericardial effusion and PNA. PMH - diastolic heart failure, pulmonary htn, copd, htn  Clinical Impression  Pt admitted with above diagnosis and presents to PT with functional limitations due to deficits listed below (See PT problem list). Pt needs skilled PT to maximize independence and safety to allow discharge to home with husband. Mobility limited primarily by poor functional activity tolerance.      Follow Up Recommendations Home health PT (may progress and not need)    Equipment Recommendations  Other (comment) (Possibly rollator)    Recommendations for Other Services       Precautions / Restrictions Precautions Precautions: Other (comment) Precaution Comments: watch SpO2      Mobility  Bed Mobility Overal bed mobility: Modified Independent             General bed mobility comments: Incr time and line management  Transfers Overall transfer level: Needs assistance Equipment used: 4-wheeled walker Transfers: Sit to/from Stand Sit to Stand: Min guard         General transfer comment: Assist for lines/safety  Ambulation/Gait Ambulation/Gait assistance: Min guard Gait Distance (Feet): 25 Feet Assistive device: 4-wheeled walker Gait Pattern/deviations: Step-through pattern;Decreased stride length Gait velocity: decr Gait velocity interpretation: <1.31 ft/sec, indicative of household ambulator General Gait Details: Assist for safety and lines. Distance limited by pulmonary status. Amb on 4L with SpO2 81%.  Stairs            Wheelchair Mobility    Modified Rankin (Stroke Patients Only)       Balance Overall balance assessment: Needs assistance Sitting-balance support: No upper extremity supported;Feet supported Sitting balance-Leahy  Scale: Good     Standing balance support: No upper extremity supported Standing balance-Leahy Scale: Fair                               Pertinent Vitals/Pain Pain Assessment: No/denies pain    Home Living Family/patient expects to be discharged to:: Private residence Living Arrangements: Spouse/significant other Available Help at Discharge: Family;Available 24 hours/day Type of Home: House Home Access: Stairs to enter Entrance Stairs-Rails: Right Entrance Stairs-Number of Steps: 2 Home Layout: One level Home Equipment: None      Prior Function Level of Independence: Needs assistance   Gait / Transfers Assistance Needed: Independent with amb without assistive device  ADL's / Homemaking Assistance Needed: Husband assist in/out of tub  Comments: Pt does some basic housework in short segments     Hand Dominance        Extremity/Trunk Assessment   Upper Extremity Assessment Upper Extremity Assessment: Defer to OT evaluation    Lower Extremity Assessment Lower Extremity Assessment: Generalized weakness       Communication   Communication: No difficulties  Cognition Arousal/Alertness: Awake/alert Behavior During Therapy: WFL for tasks assessed/performed Overall Cognitive Status: Within Functional Limits for tasks assessed                                        General Comments General comments (skin integrity, edema, etc.): At rest on 4L SpO2 94%. With amb on 4L SpO2 81%. Returned to 90% after 4 minutes sitting and O2 incr to 5L.  Exercises     Assessment/Plan    PT Assessment Patient needs continued PT services  PT Problem List Decreased strength;Decreased activity tolerance;Decreased mobility;Decreased balance;Cardiopulmonary status limiting activity       PT Treatment Interventions DME instruction;Gait training;Functional mobility training;Therapeutic activities;Therapeutic exercise;Balance training;Patient/family  education    PT Goals (Current goals can be found in the Care Plan section)  Acute Rehab PT Goals Patient Stated Goal: return home PT Goal Formulation: With patient Time For Goal Achievement: 03/07/20 Potential to Achieve Goals: Good    Frequency Min 3X/week   Barriers to discharge        Co-evaluation               AM-PAC PT "6 Clicks" Mobility  Outcome Measure Help needed turning from your back to your side while in a flat bed without using bedrails?: None Help needed moving from lying on your back to sitting on the side of a flat bed without using bedrails?: None Help needed moving to and from a bed to a chair (including a wheelchair)?: A Little Help needed standing up from a chair using your arms (e.g., wheelchair or bedside chair)?: A Little Help needed to walk in hospital room?: A Little Help needed climbing 3-5 steps with a railing? : A Little 6 Click Score: 20    End of Session Equipment Utilized During Treatment: Oxygen Activity Tolerance: Patient limited by fatigue;Other (comment) (Dyspnea/SpO2) Patient left: in chair;with call bell/phone within reach;with chair alarm set Nurse Communication: Mobility status PT Visit Diagnosis: Other abnormalities of gait and mobility (R26.89);Muscle weakness (generalized) (M62.81)    Time: 4008-6761 PT Time Calculation (min) (ACUTE ONLY): 18 min   Charges:   PT Evaluation $PT Eval Moderate Complexity: [place] Pager [phone] Office [place] 02/22/2020, 12:15 PM

## 2020-02-22 NOTE — Progress Notes (Signed)
Pt's earrings taken off of pt and given to daughter at bedside.

## 2020-02-22 NOTE — Care Management (Signed)
Benefit check requested for colchicine tablet 0.3 mg Dose: 0.3 mg Freq: Daily Route: PO

## 2020-02-22 NOTE — Plan of Care (Signed)

## 2020-02-22 NOTE — Evaluation (Addendum)
Occupational Therapy Evaluation Patient Details Name: Linda Stevens MRN: [id number] DOB: [birth year] Today's Date: 02/22/2020    History of Present Illness Pt adm with acute hypoxic respiratory failure and large pericardial effusion and PNA. PMH - diastolic heart failure, pulmonary htn, copd, htn   Clinical Impression   This 74 y/o female presents with the above. PTA pt reports being independent with ADL and functional mobility, spouse assisting with tub transfers. Pt currently presenting with weakness, decreased activity tolerance and compromised respiratory status impacting her functional performance. Pt currently requiring minA for room level functional mobility (via HHA), overall minA for ADL tasks including toileting and LB ADL. Pt initially on 4L with use of 6L for mobility/activity this session; on 6L O2 SpO2 maintaining >/=87%, returned to 4L seated EOB with SpO2 dropping with transitioning to supine, lowest noted 79%, returning to >90% within approx 2 min and cues for deep breathing and sustaining >90% end of session. Spouse present and supportive throughout session. Pt will benefit from continued acute OT services and currently recommend follow up [place] services to maximize her safety and independence with ADL and mobility. Will follow.     Follow Up Recommendations  Home health OT;Supervision/Assistance - 24 hour    Equipment Recommendations  Tub/shower seat           Precautions / Restrictions Precautions Precautions: Other (comment) Precaution Comments: watch SpO2 Restrictions Weight Bearing Restrictions: No      Mobility Bed Mobility Overal bed mobility: Modified Independent             General bed mobility comments: Incr time and line management  Transfers Overall transfer level: Needs assistance Equipment used: 1 person hand held assist Transfers: Sit to/from Stand Sit to Stand: Min assist         General transfer comment: steadying assist to rise  to standing     Balance Overall balance assessment: Needs assistance Sitting-balance support: No upper extremity supported;Feet supported Sitting balance-Leahy Scale: Good     Standing balance support: No upper extremity supported Standing balance-Leahy Scale: Fair                             ADL either performed or assessed with clinical judgement   ADL Overall ADL's : Needs assistance/impaired     Grooming: Set up;Supervision/safety;Sitting   Upper Body Bathing: Supervision/ safety;Sitting   Lower Body Bathing: Minimal assistance;Sitting/lateral leans;Sit to/from stand   Upper Body Dressing : Set up;Sitting   Lower Body Dressing: Minimal assistance;Sit to/from stand Lower Body Dressing Details (indicate cue type and reason): minA for standing balance, pt able to doff/don clean socks seated EOB via figure 4 Toilet Transfer: Minimal Teacher, English as a foreign language;Ambulation Toilet Transfer Details (indicate cue type and reason): via HHA, spouse assist with IV pole on exiting bathroom, pt quick to move to bathroom due to urgency Toileting- Clothing Manipulation and Hygiene: Minimal assistance;Sit to/from stand;Sitting/lateral lean Toileting - Clothing Manipulation Details (indicate cue type and reason): for gown management, pt able to perform pericare via lateral leans     Functional mobility during ADLs: Minimal assistance (HHA) General ADL Comments: pt presenting with overall weakness, decreased activity tolerance and cardiorespiratory status      Vision         Perception     Praxis      Pertinent Vitals/Pain Pain Assessment: No/denies pain     Hand Dominance     Extremity/Trunk Assessment Upper Extremity Assessment Upper Extremity Assessment:  Generalized weakness   Lower Extremity Assessment Lower Extremity Assessment: Defer to PT evaluation       Communication Communication Communication: No difficulties   Cognition Arousal/Alertness:  Awake/alert Behavior During Therapy: WFL for tasks assessed/performed                                   General Comments: occasionally with delayed processing/response time   General Comments  use of 6L O2 for mobility, SpO2 maintaining >/=87% with mobility to/from bathroom, transitioned back to 4L O2 once seated EOB (on 4L start of session), with SpO2 decreasing as low as 79% with bed mobility/return to sitting up in bed, within approx 2 min and deep breathing SpO2 returning to >90%    Exercises     Shoulder Instructions      Home Living Family/patient expects to be discharged to:: Private residence Living Arrangements: Spouse/significant other Available Help at Discharge: Family;Available 24 hours/day Type of Home: House Home Access: Stairs to enter [employer] of Steps: 2 Entrance Stairs-Rails: Right Home Layout: One level     Bathroom Shower/Tub: Teacher, early years/pre: Standard     Home Equipment: None          Prior Functioning/Environment Level of Independence: Needs assistance  Gait / Transfers Assistance Needed: Independent with amb without assistive device ADL's / Homemaking Assistance Needed: Husband assist in/out of tub   Comments: Pt does some basic housework in short segments        OT Problem List: Decreased strength;Decreased range of motion;Decreased activity tolerance;Impaired balance (sitting and/or standing);Decreased knowledge of use of DME or AE;Decreased knowledge of precautions;Cardiopulmonary status limiting activity      OT Treatment/Interventions: Self-care/ADL training;Therapeutic exercise;Energy conservation;DME and/or AE instruction;Therapeutic activities;Patient/family education;Balance training    OT Goals(Current goals can be found in the care plan section) Acute Rehab OT Goals Patient Stated Goal: return home OT Goal Formulation: With patient Time For Goal Achievement: 03/07/20 Potential to  Achieve Goals: Good  OT Frequency: Min 2X/week   Barriers to D/C:            Co-evaluation              AM-PAC OT "6 Clicks" Daily Activity     Outcome Measure Help from another person eating meals?: A Little Help from another person taking care of personal grooming?: A Little Help from another person toileting, which includes using toliet, bedpan, or urinal?: A Little Help from another person bathing (including washing, rinsing, drying)?: A Little Help from another person to put on and taking off regular upper body clothing?: A Little Help from another person to put on and taking off regular lower body clothing?: A Little 6 Click Score: 18   End of Session Equipment Utilized During Treatment: Gait belt;Oxygen Nurse Communication: Mobility status  Activity Tolerance: Patient tolerated treatment well;Patient limited by fatigue Patient left: in bed;with call bell/phone within reach;with bed alarm set;with family/visitor present  OT Visit Diagnosis: Muscle weakness (generalized) (M62.81);Unsteadiness on feet (R26.81);Other (comment) (decreased activity tolerance )                Time: 6701-4103 OT Time Calculation (min): 32 min Charges:  OT General Charges $OT Visit: 1 Visit OT Evaluation $OT Eval Moderate Complexity: 1 Mod OT Treatments $Self Care/Home Management : 8-22 mins  Lou Cal, OT Acute Rehabilitation Services Pager [phone] Office [place] 02/22/2020, 5:21 PM

## 2020-02-22 NOTE — Progress Notes (Signed)
Patient ID: Linda Stevens, female   DOB: 15-Feb-1946, 74 y.o.   MRN: [id number]         [facility] for Infectious Disease  Date of Admission:  02/19/2020           Day 4 vancomycin ASSESSMENT: Staph epidermidis and staph hominis have been isolated from both sets of admission blood cultures.  I am still waiting on full susceptibilities of all isolates to help determine if she probably had true bacteremia or simple contaminants.  I will continue vancomycin for now.  Repeat blood cultures are negative so far and there was no evidence of endocarditis on TTE.  PLAN: 1. Continue vancomycin  Principal Problem:   Positive blood cultures Active Problems:   Community acquired pneumonia   Congestive heart failure (CHF) (HCC)   Pulmonary artery hypertension (HCC)   Acute on chronic diastolic congestive heart failure (HCC)   Acute on chronic respiratory failure with hypoxia (HCC)   Pericardial effusion   Hepatic cirrhosis (HCC)   Pleural effusion, bilateral   New onset atrial fibrillation (HCC)   Scheduled Meds: . acidophilus  1 capsule Oral Daily  . ambrisentan  10 mg Oral Daily  . aspirin EC  81 mg Oral Daily  . colchicine  0.3 mg Oral Daily  . enoxaparin (LOVENOX) injection  40 mg Subcutaneous Q24H  . fluticasone  2 spray Each Nare Daily  . umeclidinium bromide  2 puff Inhalation Daily   And  . fluticasone furoate-vilanterol  2 puff Inhalation Daily  . guaiFENesin  600 mg Oral BID  . methylPREDNISolone (SOLU-MEDROL) injection  60 mg Intravenous Q24H  . tadalafil (PAH)  40 mg Oral Daily   Continuous Infusions: . amiodarone 30 mg/hr (02/22/20 1043)  . vancomycin 1,000 mg (02/22/20 0625)   PRN Meds:.acetaminophen **OR** acetaminophen, morphine injection, ondansetron **OR** ondansetron (ZOFRAN) IV, promethazine, traMADol   SUBJECTIVE: She is feeling better.  Her breathing is better and she has an improving appetite.  Review of Systems: Review of Systems    Constitutional: Negative for chills, diaphoresis and fever.  Respiratory: Positive for shortness of breath. Negative for cough and sputum production.   Cardiovascular: Negative for chest pain.    Allergies  Allergen Reactions  . Amoxicillin-Pot Clavulanate     REACTION: gi upset Has patient had a PCN reaction causing immediate rash, facial/tongue/throat swelling, SOB or lightheadedness with hypotension: No Has patient had a PCN reaction causing severe rash involving mucus membranes or skin necrosis: No Has patient had a PCN reaction that required hospitalization : No Has patient had a PCN reaction occurring within the last 10 years: No If all of the above answers are "NO", then may proceed with Cephalosporin use.   . Cefdinir     REACTION: gi  upset  . Moxifloxacin     REACTION: rash  . Singulair [Montelukast Sodium]     Itching     OBJECTIVE: Vitals:   02/21/20 2330 02/22/20 0300 02/22/20 0733 02/22/20 1220  BP: (!) 112/50 (!) 124/51 (!) 104/55 (!) 111/49  Pulse: 70 69 63 (!) 56  Resp: _0 Temp: 97.8 F (36.6 C) 98 F (36.7 C) (!) 97.5 F (36.4 C) 98.3 F (36.8 C)  TempSrc: Oral Oral Oral Oral  SpO2: 94% 94% 95% 95%  Weight:    62.1 kg  Height:       Body mass index is 24.25 kg/m.  Physical Exam Constitutional:      Comments: She is more alert  and in good spirits today.  She is sitting up in bed.  Cardiovascular:     Rate and Rhythm: Normal rate and regular rhythm.     Heart sounds: No murmur heard.   Pulmonary:     Effort: Pulmonary effort is normal.     Breath sounds: Normal breath sounds.  Psychiatric:        Mood and Affect: Mood normal.     Lab Results Lab Results  Component Value Date   WBC 11.2 (H) 02/22/2020   HGB 9.7 (L) 02/22/2020   HCT 30.9 (L) 02/22/2020   MCV 99.4 02/22/2020   PLT 234 02/22/2020    Lab Results  Component Value Date   CREATININE 1.39 (H) 02/22/2020   BUN 54 (H) 02/22/2020   NA 139 02/22/2020   K 3.6  02/22/2020   CL 90 (L) 02/22/2020   CO2 38 (H) 02/22/2020    Lab Results  Component Value Date   ALT 34 02/19/2020   AST 90 (H) 02/19/2020   ALKPHOS 130 (H) 02/19/2020   BILITOT 1.9 (H) 02/19/2020     Microbiology: Recent Results (from the past 240 hour(s))  SARS Coronavirus 2 by RT PCR (hospital order, performed in [place] hospital lab) Nasopharyngeal Nasopharyngeal Swab     Status: None   Collection Time: 02/19/20  5:27 AM   Specimen: Nasopharyngeal Swab  Result Value Ref Range Status   SARS Coronavirus 2 NEGATIVE NEGATIVE Final    Comment: (NOTE) SARS-CoV-2 target nucleic acids are NOT DETECTED.  The SARS-CoV-2 RNA is generally detectable in upper and lower respiratory specimens during the acute phase of infection. The lowest concentration of SARS-CoV-2 viral copies this assay can detect is 250 copies / mL. A negative result does not preclude SARS-CoV-2 infection and should not be used as the sole basis for treatment or other patient management decisions.  A negative result may occur with improper specimen collection / handling, submission of specimen other than nasopharyngeal swab, presence of viral mutation(s) within the areas targeted by this assay, and inadequate number of viral copies (<250 copies / mL). A negative result must be combined with clinical observations, patient history, and epidemiological information.  Fact Sheet for Patients:   StrictlyIdeas.no  Fact Sheet for Healthcare Providers: BankingDealers.co.za  This test is not yet approved or  cleared by the [place] FDA and has been authorized for detection and/or diagnosis of SARS-CoV-2 by FDA under an Emergency Use Authorization (EUA).  This EUA will remain in effect (meaning this test can be used) for the duration of the COVID-19 declaration under Section 564(b)(1) of the Act, 21 U.S.C. section 360bbb-3(b)(1), unless the authorization is terminated  or revoked sooner.  Performed at [facility], [place] [address], [place], [place] [address]   Culture, blood (routine x 2)     Status: Abnormal (Preliminary result)   Collection Time: 02/19/20 10:36 AM   Specimen: BLOOD RIGHT HAND  Result Value Ref Range Status   Specimen Description BLOOD RIGHT HAND  Final   Special Requests   Final    BOTTLES DRAWN AEROBIC AND ANAEROBIC Blood Culture adequate volume   Culture  Setup Time   Final    GRAM POSITIVE COCCI IN CLUSTERS IN BOTH AEROBIC AND ANAEROBIC BOTTLES CRITICAL VALUE NOTED.  VALUE IS CONSISTENT WITH PREVIOUSLY REPORTED AND CALLED VALUE.    Culture (A)  Final    STAPHYLOCOCCUS EPIDERMIDIS STAPHYLOCOCCUS HOMINIS SUSCEPTIBILITIES TO FOLLOW Performed at [facility], [place] [address],  [place], [place] [address]    Report Status PENDING  Incomplete  Culture, blood (routine x 2)     Status: Abnormal (Preliminary result)   Collection Time: 02/19/20 10:48 AM   Specimen: BLOOD  Result Value Ref Range Status   Specimen Description BLOOD RIGHT ANTECUBITAL  Final   Special Requests   Final    BOTTLES DRAWN AEROBIC AND ANAEROBIC Blood Culture adequate volume   Culture  Setup Time   Final    GRAM POSITIVE COCCI IN CLUSTERS IN BOTH AEROBIC AND ANAEROBIC BOTTLES CRITICAL RESULT CALLED TO, READ BACK BY AND VERIFIED WITH: PHRMD V BRYK _0  02/20/20 BY S GEZAHEGN    Culture (A)  Final    STAPHYLOCOCCUS EPIDERMIDIS STAPHYLOCOCCUS HOMINIS SUSCEPTIBILITIES TO FOLLOW Performed at [facility], [place] [address] [place], [place] [address]    Report Status PENDING  Incomplete  Blood Culture ID Panel (Reflexed)     Status: Abnormal   Collection Time: 02/19/20 10:48 AM  Result Value Ref Range Status   Enterococcus species NOT DETECTED NOT DETECTED Final   Listeria monocytogenes NOT DETECTED NOT DETECTED Final   Staphylococcus species DETECTED (A) NOT DETECTED Final    Comment: Methicillin (oxacillin) resistant coagulase negative  staphylococcus. Possible blood culture contaminant (unless isolated from more than one blood culture draw or clinical case suggests pathogenicity). No antibiotic treatment is indicated for blood  culture contaminants. CRITICAL RESULT CALLED TO, READ BACK BY AND VERIFIED WITH: PHRMD V BRYK _1  02/20/20 BY S GEZAHEGN    Staphylococcus aureus (BCID) NOT DETECTED NOT DETECTED Final   Methicillin resistance DETECTED (A) NOT DETECTED Final    Comment: CRITICAL RESULT CALLED TO, READ BACK BY AND VERIFIED WITH: PHRMD V BRYK _2  02/20/20 BY S GEZAHEGN    Streptococcus species NOT DETECTED NOT DETECTED Final   Streptococcus agalactiae NOT DETECTED NOT DETECTED Final   Streptococcus pneumoniae NOT DETECTED NOT DETECTED Final   Streptococcus pyogenes NOT DETECTED NOT DETECTED Final   Acinetobacter baumannii NOT DETECTED NOT DETECTED Final   Enterobacteriaceae species NOT DETECTED NOT DETECTED Final   Enterobacter cloacae complex NOT DETECTED NOT DETECTED Final   Escherichia coli NOT DETECTED NOT DETECTED Final   Klebsiella oxytoca NOT DETECTED NOT DETECTED Final   Klebsiella pneumoniae NOT DETECTED NOT DETECTED Final   Proteus species NOT DETECTED NOT DETECTED Final   Serratia marcescens NOT DETECTED NOT DETECTED Final   Haemophilus influenzae NOT DETECTED NOT DETECTED Final   Neisseria meningitidis NOT DETECTED NOT DETECTED Final   Pseudomonas aeruginosa NOT DETECTED NOT DETECTED Final   Candida albicans NOT DETECTED NOT DETECTED Final   Candida glabrata NOT DETECTED NOT DETECTED Final   Candida krusei NOT DETECTED NOT DETECTED Final   Candida parapsilosis NOT DETECTED NOT DETECTED Final   Candida tropicalis NOT DETECTED NOT DETECTED Final    Comment: Performed at [facility], [place]. [address] [place], [place] [address]  MRSA PCR Screening     Status: None   Collection Time: 02/19/20  8:59 PM   Specimen: Nasopharyngeal  Result Value Ref Range Status   MRSA by PCR NEGATIVE NEGATIVE  Final    Comment:        The GeneXpert MRSA Assay (FDA approved for NASAL specimens only), is one component of a comprehensive MRSA colonization surveillance program. It is not intended to diagnose MRSA infection nor to guide or monitor treatment for MRSA infections. Performed at [facility], [place] [address] [place], [place] [address]   Culture,  blood (Routine X 2) w Reflex to ID Panel     Status: None (Preliminary result)   Collection Time: 02/20/20 10:43 AM   Specimen: BLOOD LEFT ARM  Result Value Ref Range Status   Specimen Description BLOOD LEFT ARM  Final   Special Requests   Final    BOTTLES DRAWN AEROBIC ONLY Blood Culture adequate volume   Culture   Final    NO GROWTH 2 DAYS Performed at [facility], [address] [place], [place] [address]    Report Status PENDING  Incomplete  Culture, blood (Routine X 2) w Reflex to ID Panel     Status: None (Preliminary result)   Collection Time: 02/20/20 10:48 AM   Specimen: BLOOD LEFT WRIST  Result Value Ref Range Status   Specimen Description BLOOD LEFT WRIST  Final   Special Requests   Final    BOTTLES DRAWN AEROBIC ONLY Blood Culture results may not be optimal due to an inadequate volume of blood received in culture bottles   Culture   Final    NO GROWTH 2 DAYS Performed at [facility], [place] [address], [place], [place] [address]    Report Status PENDING  Incomplete    Michel Bickers, MD [facility] [place] [facility] [phone] pager   [phone] cell 02/22/2020, 2:23 PM

## 2020-02-23 DIAGNOSIS — R7989 Other specified abnormal findings of blood chemistry: Secondary | ICD-10-CM

## 2020-02-23 DIAGNOSIS — Z515 Encounter for palliative care: Secondary | ICD-10-CM

## 2020-02-23 DIAGNOSIS — J9601 Acute respiratory failure with hypoxia: Secondary | ICD-10-CM

## 2020-02-23 DIAGNOSIS — G9341 Metabolic encephalopathy: Secondary | ICD-10-CM

## 2020-02-23 LAB — VITAMIN B12: Vitamin B-12: 477 pg/mL (ref 180–914)

## 2020-02-23 LAB — RENAL FUNCTION PANEL
Albumin: 2.7 g/dL — ABNORMAL LOW (ref 3.5–5.0)
Anion gap: 10 (ref 5–15)
BUN: 58 mg/dL — ABNORMAL HIGH (ref 8–23)
CO2: 40 mmol/L — ABNORMAL HIGH (ref 22–32)
Calcium: 8.6 mg/dL — ABNORMAL LOW (ref 8.9–10.3)
Chloride: 90 mmol/L — ABNORMAL LOW (ref 98–111)
Creatinine, Ser: 1.35 mg/dL — ABNORMAL HIGH (ref 0.44–1.00)
GFR calc Af Amer: 46 mL/min — ABNORMAL LOW (ref >60)
GFR calc non Af Amer: 39 mL/min — ABNORMAL LOW (ref >60)
Glucose, Bld: 119 mg/dL — ABNORMAL HIGH (ref 70–99)
Phosphorus: 4.4 mg/dL (ref 2.5–4.6)
Potassium: 4 mmol/L (ref 3.5–5.1)
Sodium: 140 mmol/L (ref 135–145)

## 2020-02-23 LAB — URINALYSIS, ROUTINE W REFLEX MICROSCOPIC
Bilirubin Urine: NEGATIVE
Glucose, UA: NEGATIVE mg/dL
Hgb urine dipstick: NEGATIVE
Ketones, ur: NEGATIVE mg/dL
Leukocytes,Ua: NEGATIVE
Nitrite: NEGATIVE
Protein, ur: NEGATIVE mg/dL
Specific Gravity, Urine: 1.019 (ref 1.005–1.030)
pH: 5 (ref 5.0–8.0)

## 2020-02-23 LAB — BLOOD GAS, ARTERIAL
Acid-Base Excess: 16.1 mmol/L — ABNORMAL HIGH (ref 0.0–2.0)
Bicarbonate: 42.4 mmol/L — ABNORMAL HIGH (ref 20.0–28.0)
FIO2: 36
O2 Saturation: 93.3 %
Patient temperature: 37
pCO2 arterial: 78.9 mmHg (ref 32.0–48.0)
pH, Arterial: 7.35 (ref 7.350–7.450)
pO2, Arterial: 65.7 mmHg — ABNORMAL LOW (ref 83.0–108.0)

## 2020-02-23 LAB — CULTURE, BLOOD (ROUTINE X 2)
Special Requests: ADEQUATE
Special Requests: ADEQUATE

## 2020-02-23 LAB — STREP PNEUMONIAE URINARY ANTIGEN: Strep Pneumo Urinary Antigen: NEGATIVE

## 2020-02-23 LAB — FOLATE: Folate: 9.9 ng/mL (ref >5.9)

## 2020-02-23 LAB — CBC
HCT: 31.5 % — ABNORMAL LOW (ref 36.0–46.0)
Hemoglobin: 9.8 g/dL — ABNORMAL LOW (ref 12.0–15.0)
MCH: 31.4 pg (ref 26.0–34.0)
MCHC: 31.1 g/dL (ref 30.0–36.0)
MCV: 101 fL — ABNORMAL HIGH (ref 80.0–100.0)
Platelets: 232 10*3/uL (ref 150–400)
RBC: 3.12 MIL/uL — ABNORMAL LOW (ref 3.87–5.11)
RDW: 16 % — ABNORMAL HIGH (ref 11.5–15.5)
WBC: 9.8 10*3/uL (ref 4.0–10.5)
nRBC: 0.6 % — ABNORMAL HIGH (ref 0.0–0.2)

## 2020-02-23 LAB — RETICULOCYTES
Immature Retic Fract: 24.8 % — ABNORMAL HIGH (ref 2.3–15.9)
RBC.: 3.17 MIL/uL — ABNORMAL LOW (ref 3.87–5.11)
Retic Count, Absolute: 84.6 10*3/uL (ref 19.0–186.0)
Retic Ct Pct: 2.7 % (ref 0.4–3.1)

## 2020-02-23 LAB — IRON AND TIBC
Iron: 42 ug/dL (ref 28–170)
Saturation Ratios: 16 % (ref 10.4–31.8)
TIBC: 259 ug/dL (ref 250–450)
UIBC: 217 ug/dL

## 2020-02-23 LAB — HEPATIC FUNCTION PANEL
ALT: 47 U/L — ABNORMAL HIGH (ref 0–44)
AST: 26 U/L (ref 15–41)
Albumin: 2.7 g/dL — ABNORMAL LOW (ref 3.5–5.0)
Alkaline Phosphatase: 92 U/L (ref 38–126)
Bilirubin, Direct: 0.2 mg/dL (ref 0.0–0.2)
Indirect Bilirubin: 0.6 mg/dL (ref 0.3–0.9)
Total Bilirubin: 0.8 mg/dL (ref 0.3–1.2)
Total Protein: 5.8 g/dL — ABNORMAL LOW (ref 6.5–8.1)

## 2020-02-23 LAB — AMMONIA: Ammonia: 52 umol/L — ABNORMAL HIGH (ref 9–35)

## 2020-02-23 LAB — FERRITIN: Ferritin: 426 ng/mL — ABNORMAL HIGH (ref 11–307)

## 2020-02-23 LAB — MAGNESIUM: Magnesium: 3.2 mg/dL — ABNORMAL HIGH (ref 1.7–2.4)

## 2020-02-23 MED ORDER — TRAMADOL HCL 50 MG PO TABS
50.0000 mg | ORAL_TABLET | Freq: Two times a day (BID) | ORAL | Status: DC | PRN
  Administered 2020-02-25 – 2020-02-26 (×3): 50 mg via ORAL
  Filled 2020-02-23 (×3): qty 1

## 2020-02-23 MED ORDER — SENNA 8.6 MG PO TABS
1.0000 | ORAL_TABLET | Freq: Every day | ORAL | Status: DC
  Administered 2020-02-23 – 2020-02-26 (×2): 8.6 mg via ORAL
  Filled 2020-02-23 (×6): qty 1

## 2020-02-23 MED ORDER — ACETAMINOPHEN 325 MG PO TABS
650.0000 mg | ORAL_TABLET | Freq: Three times a day (TID) | ORAL | Status: DC
  Administered 2020-02-23 – 2020-03-10 (×49): 650 mg via ORAL
  Filled 2020-02-23 (×49): qty 2

## 2020-02-23 MED ORDER — LACTULOSE 10 GM/15ML PO SOLN
20.0000 g | Freq: Two times a day (BID) | ORAL | Status: DC
  Administered 2020-02-23: 20 g via ORAL
  Filled 2020-02-23: qty 30

## 2020-02-23 MED ORDER — ACETAMINOPHEN 500 MG PO TABS: 1000.0000 mg | ORAL_TABLET | Freq: Three times a day (TID) | ORAL | Status: DC

## 2020-02-23 NOTE — Progress Notes (Signed)
PROGRESS NOTE  Linda Stevens JKD:[id number] DOB: [birth year]   PCP: [name], MD  Patient is from: Home.  DOA: 02/19/2020 LOS: 4  Brief Narrative / Interim history: 74 year old female with history of COPD/chronic RF on 3 L, diastolic CHF, pulmonary HTN, HTN and HLD presenting with progressive shortness of breath for 1 week.  Tried with a course of Z-Pak and a steroid taper by her pulmonologist on 6/9 but continues to have progressive shortness of breath.  In ED, slightly hypotensive.  Required 6 L by [place].  WBC 11.4.  AST 90.  Total bili 1.9.  Lactic acid 2.8.  BNP 164.  Trop negative x2.  CXR concerning for cardiomegaly with small pleural effusion and by basilar atelectasis.  CTA chest showed sizable pericardial effusion, bilateral pleural effusions and consolidation concerning for pneumonia and pulmonary HTN.  Cultures drawn.  Received IV Solu-Medrol, morphine, Rocephin and azithromycin,and admitted for acute on chronic respiratory failure due to pneumonia, pleural effusions and pericardial effusion.  Cardiology consulted for pericardial effusion.  Echo obtained and revealed dilated and fixed IVC although no other features of tamponade.  The next day, blood cultures with GPC's in clusters in 4/4.  BICD with coag negative MR staph.  Vancomycin added.  ID consulted.  Azithromycin and ceftriaxone discontinued.  Repeat blood culture obtained and NGTD. Eventually, vancomycin discontinued as susceptibilities on initial blood culture were different suggesting contamination versus true bacteremia.  Patient went into A. fib with RVR with hypotension on 6/14.  Started on amiodarone drip and converted to sinus rhythm.  Advanced heart failure team involved as well.  Subjective: Seen and examined earlier this morning.  No major events overnight or this morning.  She feels "not good" today.  Could not be more specific but feels tired and weak.  Still with some left-sided chest pain.  Breathing about  the same.  Denies nausea or vomiting.  Objective: Vitals:   02/23/20 0757 02/23/20 1041 02/23/20 1244 02/23/20 1245  BP:  (!) 117/49 (!) 106/47 (!) 106/47  Pulse:   70   Resp:   14   Temp:   98.7 F (37.1 C)   TempSrc:   Oral   SpO2: 91%  95%   Weight:      Height:        Intake/Output Summary (Last 24 hours) at 02/23/2020 1548 Last data filed at 02/23/2020 1000 Gross per 24 hour  Intake 640.69 ml  Output 520 ml  Net 120.69 ml   Filed Weights   02/19/20 2055 02/22/20 1220 02/23/20 0300  Weight: 63 kg 62.1 kg 65.6 kg    Examination:.  GENERAL: Chronically ill-appearing.  Looks tired HEENT: MMM.  Vision and hearing grossly intact.  NECK: Supple.  Prominent JVD RESP: 91% on 3 L.  No IWOB.  Fair aeration.  Bibasilar crackles. CVS:  RRR. Heart sounds normal.  ABD/GI/GU: BS+. Abd soft, NTND.  MSK/EXT:  Moves extremities. No apparent deformity. No edema.  SKIN: no apparent skin lesion or wound NEURO: Awake but not alert.  Oriented to self, place, person and month but not date or year.  Slow to respond.  No apparent focal neuro deficit. PSYCH: Calm but looks tired.  Procedures:  None  Microbiology summarized: COVID-19 PCR negative. MRSA PCR negative. Blood cultures with GPC's in clusters in 4/4. BICD with coag negative methicillin-resistant staph with different susceptibilities deemed to be contaminant.  Assessment & Plan: Acute metabolic encephalopathy: Oriented to self, place, person and month.  Slow to respond  to questions.  She has hypercarbia to 79 but well compensated.  Ammonia slightly elevated to 52.  TSH and B12 within normal.  Morphine, tramadol and Phenergan could contribute. -Order scheduled Tylenol.  May have to wean off opiates and avoid Phenergan. -Start low-dose lactulose.  Mildly elevated ammonia could be due to intravascular depletion. -CT head and intermittent BiPAP if no improvement -Reorientation and delirium precautions.  Abnormal blood culture  versus bacteremia-initial blood cultures with methicillin-resistant Streptococcus epidermidis and homnis with different susceptibilities deemed to be contaminant..   Repeat blood culture on 6/14 NGTD.  TTE without vegetation.  Unclear.  Initial blood culture could be contamination.  Leukocytosis improving. -Appreciate ID guidance-recommended discontinue vancomycin and signed off. -Continue vancomycin 6/14> 6/17 -Ceftriaxone and azithromycin discontinued.   Community-acquired pneumonia? CTA concerning for bibasilar consolidation with pleural effusion but significant diagnosis given other confounding factors. -Received vancomycin, ceftriaxone and azithromycin as above. -Off antibiotics now per ID recommendation -Incentive spirometry and flutter valve  Acute on chronic respiratory failure with hypoxia: Multifactorial including pericardiac and pleural effusion, pulmonary hypertension, underlying COPD, possible pneumonia and A. fib with RVR.  On 3 L at baseline. ABG suggestive for well compensated chronic respiratory acidosis. -Wean oxygen as able-minimum oxygen to keep saturation above 90% -Treat potential etiologies  Pericardial effusion: Reported as sizable on initial Echo that also revealed dilated and fixated IVC but no other features of tamponade.  Repeat echo repeatedly with slightly small pericardial effusion.  She has prominent JVD on exam.  ESR 63.  Rheumatologic exam negative so far. -Cardiology managing. -IV antibiotics as above.  Pleural effusion: Looks a small on review of CTA chest -Continue antibiotics as above  Pulmonary hypertension: Felt to be secondary in the setting of underlying lung disease.  PAPP 87 mmHg. -Advanced heart failure team following. -On sildenafil and ambrisentan  A. fib with RVR: Noted to have A. fib on EKG obtained by EMS.  Went into RVR on 6/14 and 6/15.  Converted to sinus rhythm with amiodarone drip. -Amiodarone per cardiology -Defer anticoagulation to  cardiology. -Optimize Mg and K.  Chronic diastolic CHF: Echo with preserved EF and G1 DD.  BNP lower than baseline.  Reports good urine output although not captured.  Creatinine improving.  Diuretics on hold per cardiology -Monitor fluid status, renal function and electrolytes -Defer diuretics to cardiology.  AKI/azotemia: Baseline Cr 0.8-0.9> 1.01 (admit)> > 1.49> 1.35.  BUN 43>54>58.  Likely hemodynamically mediated. -Continue monitoring -Avoid nephrotoxic meds  Metabolic alkalosis: Likely compensation for respiratory acidosis -Diuretics on hold -Continue monitoring.  Hyperphosphatemia: Likely due to renal failure.  Resolved. -Continue monitoring  Essential hypertension: Soft blood pressures. -Continue monitoring  Elevated liver enzymes/hyperbilirubinemia: Likely congestive hepatopathy. -Continue monitoring.  Normocytic anemia: Baseline Hgb 11-12> 12.0 (admit)>>> 9.7.  Suspect hemodilution -Continue monitoring  Acute on chronic COPD -IV Solu-Medrol 60 mg daily, Trelegy Ellipta  Liver cirrhosis-due to pulmonary hypertension/heart failure?  Elevated AST/hyperbilirubinemia-likely due to bacteremia and possible congestive etiology -Continue trending  Debility/physical deconditioning -PT/OT  Goal of care-significant cardiopulmonary comorbidities as above.  Prognosis is not great.  She is a still full code.  This would probably pose more harm than benefit. -Appreciate input by palliative care              DVT prophylaxis:  enoxaparin (LOVENOX) injection 40 mg Start: 02/19/20 1800  Code Status: Full code Family Communication: Updated patient's husband at bedside Status is: Inpatient  Remains inpatient appropriate because:Hemodynamically unstable, Ongoing diagnostic testing needed not appropriate for outpatient  work up, IV treatments appropriate due to intensity of illness or inability to take PO and Inpatient level of care appropriate due to severity of  illness   Dispo: The patient is from: Home              Anticipated d/c is to:  To be determined              Anticipated d/c date is: > 3 days              Patient currently is not medically stable to d/c.       Consultants:  Cardiology Advanced heart failure team Infectious disease Palliative care medicine   Sch Meds:  Scheduled Meds: . acidophilus  1 capsule Oral Daily  . ambrisentan  10 mg Oral Daily  . aspirin EC  81 mg Oral Daily  . colchicine  0.3 mg Oral Daily  . enoxaparin (LOVENOX) injection  40 mg Subcutaneous Q24H  . fluticasone  2 spray Each Nare Daily  . umeclidinium bromide  2 puff Inhalation Daily   And  . fluticasone furoate-vilanterol  2 puff Inhalation Daily  . guaiFENesin  600 mg Oral BID  . methylPREDNISolone (SOLU-MEDROL) injection  60 mg Intravenous Q24H  . senna  1 tablet Oral Daily  . tadalafil (PAH)  40 mg Oral Daily   Continuous Infusions: . amiodarone 30 mg/hr (02/23/20 1059)   PRN Meds:.acetaminophen **OR** acetaminophen, morphine injection, ondansetron **OR** ondansetron (ZOFRAN) IV, promethazine, traMADol  Antimicrobials: Anti-infectives (From admission, onward)   Start     Dose/Rate Route Frequency Ordered Stop   02/21/20 0600  vancomycin (VANCOCIN) IVPB 1000 mg/200 mL premix  Status:  Discontinued        1,000 mg 200 mL/hr over 60 Minutes Intravenous Every 24 hours 02/20/20 0621 02/23/20 1223   02/20/20 1000  cefTRIAXone (ROCEPHIN) 2 g in sodium chloride 0.9 % 100 mL IVPB  Status:  Discontinued        2 g 200 mL/hr over 30 Minutes Intravenous Every 24 hours 02/19/20 1342 02/20/20 0616   02/20/20 1000  azithromycin (ZITHROMAX) 500 mg in sodium chloride 0.9 % 250 mL IVPB  Status:  Discontinued        500 mg 250 mL/hr over 60 Minutes Intravenous Every 24 hours 02/19/20 1342 02/20/20 1019   02/20/20 1000  cefTRIAXone (ROCEPHIN) 2 g in sodium chloride 0.9 % 100 mL IVPB  Status:  Discontinued        2 g 200 mL/hr over 30 Minutes  Intravenous Every 24 hours 02/20/20 0913 02/21/20 0749   02/20/20 0645  vancomycin (VANCOREADY) IVPB 1250 mg/250 mL        1,250 mg 166.7 mL/hr over 90 Minutes Intravenous  Once 02/20/20 0621 02/21/20 1649   02/19/20 1015  cefTRIAXone (ROCEPHIN) 1 g in sodium chloride 0.9 % 100 mL IVPB        1 g 200 mL/hr over 30 Minutes Intravenous  Once 02/19/20 1008 02/19/20 1151   02/19/20 1015  azithromycin (ZITHROMAX) 500 mg in sodium chloride 0.9 % 250 mL IVPB        500 mg 250 mL/hr over 60 Minutes Intravenous  Once 02/19/20 1008 02/19/20 1324       I have personally reviewed the following labs and images: CBC: Recent Labs  Lab 02/19/20 0405 02/20/20 0240 02/21/20 0227 02/22/20 0259 02/23/20 0339  WBC 11.4* 17.9* 15.8* 11.2* 9.8  NEUTROABS 9.8*  --   --   --   --  HGB 12.0 11.0* 10.1* 9.7* 9.8*  HCT 38.5 35.6* 32.2* 30.9* 31.5*  MCV 101.0* 100.3* 100.0 99.4 101.0*  PLT 309 251 250 234 232   BMP &GFR Recent Labs  Lab 02/19/20 0405 02/19/20 1039 02/20/20 0240 02/21/20 0227 02/22/20 0259 02/23/20 0339  NA 138  --  136 140 139 140  K 4.3  --  3.7 3.8 3.6 4.0  CL 92*  --  87* 89* 90* 90*  CO2 30  --  37* 38* 38* 40*  GLUCOSE 205*  --  136* 129* 129* 119*  BUN 13  --  25* 43* 54* 58*  CREATININE 1.01*  --  1.45* 1.49* 1.39* 1.35*  CALCIUM 7.9*  --  8.3* 8.6* 8.5* 8.6*  MG  --  1.9 2.1 2.5* 2.7* 3.2*  PHOS  --   --   --  5.4* 4.8* 4.4   Estimated Creatinine Clearance: 34.8 mL/min (A) (by C-G formula based on SCr of 1.35 mg/dL (H)). Liver & Pancreas: Recent Labs  Lab 02/19/20 0405 02/21/20 0227 02/22/20 0259 02/23/20 0339  AST 90*  --   --   --   ALT 34  --   --   --   ALKPHOS 130*  --   --   --   BILITOT 1.9*  --   --   --   PROT 6.1*  --   --   --   ALBUMIN 3.1* 2.9* 2.7* 2.7*   No results for input(s): LIPASE, AMYLASE in the last 168 hours. Recent Labs  Lab 02/23/20 1040  AMMONIA 52*   Diabetic: No results for input(s): HGBA1C in the last 72 hours. No  results for input(s): GLUCAP in the last 168 hours. Cardiac Enzymes: No results for input(s): CKTOTAL, CKMB, CKMBINDEX, TROPONINI in the last 168 hours. No results for input(s): PROBNP in the last 8760 hours. Coagulation Profile: No results for input(s): INR, PROTIME in the last 168 hours. Thyroid Function Tests: No results for input(s): TSH, T4TOTAL, FREET4, T3FREE, THYROIDAB in the last 72 hours. Lipid Profile: No results for input(s): CHOL, HDL, LDLCALC, TRIG, CHOLHDL, LDLDIRECT in the last 72 hours. Anemia Panel: Recent Labs    02/23/20 1040  VITAMINB12 477  FOLATE 9.9  FERRITIN 426*  TIBC 259  IRON 42  RETICCTPCT 2.7   Urine analysis:    Component Value Date/Time   COLORURINE YELLOW 02/23/2020 1319   APPEARANCEUR HAZY (A) 02/23/2020 1319   LABSPEC 1.019 02/23/2020 1319   PHURINE 5.0 02/23/2020 1319   GLUCOSEU NEGATIVE 02/23/2020 1319   GLUCOSEU NEGATIVE 07/07/2017 1625   HGBUR NEGATIVE 02/23/2020 1319   HGBUR negative 12/22/2007 1139   BILIRUBINUR NEGATIVE 02/23/2020 1319   KETONESUR NEGATIVE 02/23/2020 1319   PROTEINUR NEGATIVE 02/23/2020 1319   UROBILINOGEN 0.2 07/07/2017 1625   NITRITE NEGATIVE 02/23/2020 1319   LEUKOCYTESUR NEGATIVE 02/23/2020 1319   Sepsis Labs: Invalid input(s): PROCALCITONIN, [place]  Microbiology: Recent Results (from the past 240 hour(s))  SARS Coronavirus 2 by RT PCR (hospital order, performed in [facility] hospital lab) Nasopharyngeal Nasopharyngeal Swab     Status: None   Collection Time: 02/19/20  5:27 AM   Specimen: Nasopharyngeal Swab  Result Value Ref Range Status   SARS Coronavirus 2 NEGATIVE NEGATIVE Final    Comment: (NOTE) SARS-CoV-2 target nucleic acids are NOT DETECTED.  The SARS-CoV-2 RNA is generally detectable in upper and lower respiratory specimens during the acute phase of infection. The lowest concentration of SARS-CoV-2 viral copies this assay can detect  is 250 copies / mL. A negative result does not  preclude SARS-CoV-2 infection and should not be used as the sole basis for treatment or other patient management decisions.  A negative result may occur with improper specimen collection / handling, submission of specimen other than nasopharyngeal swab, presence of viral mutation(s) within the areas targeted by this assay, and inadequate number of viral copies (<250 copies / mL). A negative result must be combined with clinical observations, patient history, and epidemiological information.  Fact Sheet for Patients:   StrictlyIdeas.no  Fact Sheet for Healthcare Providers: BankingDealers.co.za  This test is not yet approved or  cleared by the [place] FDA and has been authorized for detection and/or diagnosis of SARS-CoV-2 by FDA under an Emergency Use Authorization (EUA).  This EUA will remain in effect (meaning this test can be used) for the duration of the COVID-19 declaration under Section 564(b)(1) of the Act, 21 U.S.C. section 360bbb-3(b)(1), unless the authorization is terminated or revoked sooner.  Performed at [facility], [place] [address] [place], [place] [address]   Culture, blood (routine x 2)     Status: Abnormal   Collection Time: 02/19/20 10:36 AM   Specimen: BLOOD RIGHT HAND  Result Value Ref Range Status   Specimen Description BLOOD RIGHT HAND  Final   Special Requests   Final    BOTTLES DRAWN AEROBIC AND ANAEROBIC Blood Culture adequate volume   Culture  Setup Time   Final    GRAM POSITIVE COCCI IN CLUSTERS IN BOTH AEROBIC AND ANAEROBIC BOTTLES CRITICAL VALUE NOTED.  VALUE IS CONSISTENT WITH PREVIOUSLY REPORTED AND CALLED VALUE. Performed at [facility], [place] [address], [place], [place] [address]    Culture (A)  Final    STAPHYLOCOCCUS EPIDERMIDIS STAPHYLOCOCCUS HOMINIS    Report Status 02/23/2020 FINAL  Final   Organism ID, Bacteria STAPHYLOCOCCUS EPIDERMIDIS  Final   Organism ID, Bacteria  STAPHYLOCOCCUS HOMINIS  Final      Susceptibility   Staphylococcus epidermidis - MIC*    CIPROFLOXACIN 4 RESISTANT Resistant     ERYTHROMYCIN >=8 RESISTANT Resistant     GENTAMICIN <=0.5 SENSITIVE Sensitive     OXACILLIN >=4 RESISTANT Resistant     TETRACYCLINE >=16 RESISTANT Resistant     VANCOMYCIN 1 SENSITIVE Sensitive     TRIMETH/SULFA 160 RESISTANT Resistant     CLINDAMYCIN RESISTANT Resistant     RIFAMPIN <=0.5 SENSITIVE Sensitive     Inducible Clindamycin POSITIVE Resistant     * STAPHYLOCOCCUS EPIDERMIDIS   Staphylococcus hominis - MIC*    CIPROFLOXACIN <=0.5 SENSITIVE Sensitive     ERYTHROMYCIN <=0.25 SENSITIVE Sensitive     GENTAMICIN <=0.5 SENSITIVE Sensitive     OXACILLIN <=0.25 SENSITIVE Sensitive     TETRACYCLINE <=1 SENSITIVE Sensitive     VANCOMYCIN <=0.5 SENSITIVE Sensitive     TRIMETH/SULFA <=10 SENSITIVE Sensitive     CLINDAMYCIN <=0.25 SENSITIVE Sensitive     RIFAMPIN <=0.5 SENSITIVE Sensitive     Inducible Clindamycin NEGATIVE Sensitive     * STAPHYLOCOCCUS HOMINIS  Culture, blood (routine x 2)     Status: Abnormal   Collection Time: 02/19/20 10:48 AM   Specimen: BLOOD  Result Value Ref Range Status   Specimen Description BLOOD RIGHT ANTECUBITAL  Final   Special Requests   Final    BOTTLES DRAWN AEROBIC AND ANAEROBIC Blood Culture adequate volume   Culture  Setup Time   Final    GRAM POSITIVE COCCI IN CLUSTERS IN BOTH  AEROBIC AND ANAEROBIC BOTTLES CRITICAL RESULT CALLED TO, READ BACK BY AND VERIFIED WITH: PHRMD V BRYK _0  02/20/20 BY S GEZAHEGN Performed at [facility], [place] [address], [place], [place] [address]    Culture (A)  Final    STAPHYLOCOCCUS EPIDERMIDIS STAPHYLOCOCCUS HOMINIS    Report Status 02/23/2020 FINAL  Final   Organism ID, Bacteria STAPHYLOCOCCUS EPIDERMIDIS  Final   Organism ID, Bacteria STAPHYLOCOCCUS HOMINIS  Final      Susceptibility   Staphylococcus epidermidis - MIC*    CIPROFLOXACIN >=8 RESISTANT Resistant      ERYTHROMYCIN >=8 RESISTANT Resistant     GENTAMICIN <=0.5 SENSITIVE Sensitive     OXACILLIN >=4 RESISTANT Resistant     TETRACYCLINE <=1 SENSITIVE Sensitive     VANCOMYCIN 1 SENSITIVE Sensitive     TRIMETH/SULFA 160 RESISTANT Resistant     CLINDAMYCIN RESISTANT Resistant     RIFAMPIN <=0.5 SENSITIVE Sensitive     Inducible Clindamycin POSITIVE Resistant     * STAPHYLOCOCCUS EPIDERMIDIS   Staphylococcus hominis - MIC*    CIPROFLOXACIN 4 RESISTANT Resistant     ERYTHROMYCIN >=8 RESISTANT Resistant     GENTAMICIN <=0.5 SENSITIVE Sensitive     OXACILLIN >=4 RESISTANT Resistant     TETRACYCLINE <=1 SENSITIVE Sensitive     VANCOMYCIN 1 SENSITIVE Sensitive     TRIMETH/SULFA 160 RESISTANT Resistant     CLINDAMYCIN RESISTANT Resistant     RIFAMPIN <=0.5 SENSITIVE Sensitive     Inducible Clindamycin POSITIVE Resistant     * STAPHYLOCOCCUS HOMINIS  Blood Culture ID Panel (Reflexed)     Status: Abnormal   Collection Time: 02/19/20 10:48 AM  Result Value Ref Range Status   Enterococcus species NOT DETECTED NOT DETECTED Final   Listeria monocytogenes NOT DETECTED NOT DETECTED Final   Staphylococcus species DETECTED (A) NOT DETECTED Final    Comment: Methicillin (oxacillin) resistant coagulase negative staphylococcus. Possible blood culture contaminant (unless isolated from more than one blood culture draw or clinical case suggests pathogenicity). No antibiotic treatment is indicated for blood  culture contaminants. CRITICAL RESULT CALLED TO, READ BACK BY AND VERIFIED WITH: PHRMD V BRYK _1  02/20/20 BY S GEZAHEGN    Staphylococcus aureus (BCID) NOT DETECTED NOT DETECTED Final   Methicillin resistance DETECTED (A) NOT DETECTED Final    Comment: CRITICAL RESULT CALLED TO, READ BACK BY AND VERIFIED WITH: PHRMD V BRYK _2  02/20/20 BY S GEZAHEGN    Streptococcus species NOT DETECTED NOT DETECTED Final   Streptococcus agalactiae NOT DETECTED NOT DETECTED Final   Streptococcus pneumoniae NOT  DETECTED NOT DETECTED Final   Streptococcus pyogenes NOT DETECTED NOT DETECTED Final   Acinetobacter baumannii NOT DETECTED NOT DETECTED Final   Enterobacteriaceae species NOT DETECTED NOT DETECTED Final   Enterobacter cloacae complex NOT DETECTED NOT DETECTED Final   Escherichia coli NOT DETECTED NOT DETECTED Final   Klebsiella oxytoca NOT DETECTED NOT DETECTED Final   Klebsiella pneumoniae NOT DETECTED NOT DETECTED Final   Proteus species NOT DETECTED NOT DETECTED Final   Serratia marcescens NOT DETECTED NOT DETECTED Final   Haemophilus influenzae NOT DETECTED NOT DETECTED Final   Neisseria meningitidis NOT DETECTED NOT DETECTED Final   Pseudomonas aeruginosa NOT DETECTED NOT DETECTED Final   Candida albicans NOT DETECTED NOT DETECTED Final   Candida glabrata NOT DETECTED NOT DETECTED Final   Candida krusei NOT DETECTED NOT DETECTED Final   Candida parapsilosis NOT DETECTED NOT DETECTED Final   Candida tropicalis NOT DETECTED NOT DETECTED Final  Comment: Performed at [facility], [place] [address], [place], [place] [address]  MRSA PCR Screening     Status: None   Collection Time: 02/19/20  8:59 PM   Specimen: Nasopharyngeal  Result Value Ref Range Status   MRSA by PCR NEGATIVE NEGATIVE Final    Comment:        The GeneXpert MRSA Assay (FDA approved for NASAL specimens only), is one component of a comprehensive MRSA colonization surveillance program. It is not intended to diagnose MRSA infection nor to guide or monitor treatment for MRSA infections. Performed at [facility], [place] [address], [place], [place] [address]   Culture, blood (Routine X 2) w Reflex to ID Panel     Status: None (Preliminary result)   Collection Time: 02/20/20 10:43 AM   Specimen: BLOOD LEFT ARM  Result Value Ref Range Status   Specimen Description BLOOD LEFT ARM  Final   Special Requests   Final    BOTTLES DRAWN AEROBIC ONLY Blood Culture adequate volume   Culture   Final    NO GROWTH  3 DAYS Performed at [facility], [address] [place], [place] [address]    Report Status PENDING  Incomplete  Culture, blood (Routine X 2) w Reflex to ID Panel     Status: None (Preliminary result)   Collection Time: 02/20/20 10:48 AM   Specimen: BLOOD LEFT WRIST  Result Value Ref Range Status   Specimen Description BLOOD LEFT WRIST  Final   Special Requests   Final    BOTTLES DRAWN AEROBIC ONLY Blood Culture results may not be optimal due to an inadequate volume of blood received in culture bottles   Culture   Final    NO GROWTH 3 DAYS Performed at [facility], [place] [address] [place], [place] [address]    Report Status PENDING  Incomplete    Radiology Studies: No results found.   Jose Corvin T. [place]  If 7PM-7AM, please contact night-coverage [email or web address] Password [facility] 02/23/2020, 3:48 PM

## 2020-02-23 NOTE — Progress Notes (Signed)
Patient ID: Linda Stevens, female   DOB: [birth year], 74 y.o.   MRN: [id number]         [facility]  Date of Admission:  02/19/2020           Day 5 vancomycin ASSESSMENT: The coagulase-negative staph isolate in her blood cultures have different antibiotic susceptibilities indicating that they represent contaminants rather than true bacteremia.  PLAN: 1. Discontinue vancomycin 2. I will sign off now  Principal Problem:   Positive blood cultures Active Problems:   Community acquired pneumonia   Congestive heart failure (CHF) ([place])   Pulmonary artery hypertension (HCC)   Acute on chronic diastolic congestive heart failure (HCC)   Acute on chronic respiratory failure with hypoxia (HCC)   Pericardial effusion   Hepatic cirrhosis (HCC)   Pleural effusion, bilateral   New onset atrial fibrillation (HCC)   Scheduled Meds: . acidophilus  1 capsule Oral Daily  . ambrisentan  10 mg Oral Daily  . aspirin EC  81 mg Oral Daily  . colchicine  0.3 mg Oral Daily  . enoxaparin (LOVENOX) injection  40 mg Subcutaneous Q24H  . fluticasone  2 spray Each Nare Daily  . umeclidinium bromide  2 puff Inhalation Daily   And  . fluticasone furoate-vilanterol  2 puff Inhalation Daily  . guaiFENesin  600 mg Oral BID  . methylPREDNISolone (SOLU-MEDROL) injection  60 mg Intravenous Q24H  . senna  1 tablet Oral Daily  . tadalafil (PAH)  40 mg Oral Daily   Continuous Infusions: . amiodarone 30 mg/hr (02/23/20 1059)   PRN Meds:.acetaminophen **OR** acetaminophen, morphine injection, ondansetron **OR** ondansetron (ZOFRAN) IV, promethazine, traMADol   SUBJECTIVE: She is feeling better.    Review of Systems: Review of Systems  Constitutional: Negative for chills, diaphoresis and fever.  Respiratory: Positive for shortness of breath. Negative for cough and sputum production.   Cardiovascular: Negative for chest pain.    Allergies  Allergen Reactions  .  Amoxicillin-Pot Clavulanate     REACTION: gi upset Has patient had a PCN reaction causing immediate rash, facial/tongue/throat swelling, SOB or lightheadedness with hypotension: No Has patient had a PCN reaction causing severe rash involving mucus membranes or skin necrosis: No Has patient had a PCN reaction that required hospitalization : No Has patient had a PCN reaction occurring within the last 10 years: No If all of the above answers are "NO", then may proceed with Cephalosporin use.   . Cefdinir     REACTION: gi  upset  . Moxifloxacin     REACTION: rash  . Singulair [Montelukast Sodium]     Itching     OBJECTIVE: Vitals:   02/23/20 0757 02/23/20 1041 02/23/20 1244 02/23/20 1245  BP:  (!) 117/49 (!) 106/47 (!) 106/47  Pulse:   70   Resp:   14   Temp:   98.7 F (37.1 C)   TempSrc:   Oral   SpO2: 91%  95%   Weight:      Height:       Body mass index is 25.62 kg/m.  Physical Exam Constitutional:      Comments: She is sitting up in bed.  Cardiovascular:     Rate and Rhythm: Normal rate and regular rhythm.     Heart sounds: No murmur heard.   Pulmonary:     Effort: Pulmonary effort is normal.     Breath sounds: Normal breath sounds.  Psychiatric:        Mood and  Affect: Mood normal.     Lab Results Lab Results  Component Value Date   WBC 9.8 02/23/2020   HGB 9.8 (L) 02/23/2020   HCT 31.5 (L) 02/23/2020   MCV 101.0 (H) 02/23/2020   PLT 232 02/23/2020    Lab Results  Component Value Date   CREATININE 1.35 (H) 02/23/2020   BUN 58 (H) 02/23/2020   NA 140 02/23/2020   K 4.0 02/23/2020   CL 90 (L) 02/23/2020   CO2 40 (H) 02/23/2020    Lab Results  Component Value Date   ALT 34 02/19/2020   AST 90 (H) 02/19/2020   ALKPHOS 130 (H) 02/19/2020   BILITOT 1.9 (H) 02/19/2020     Microbiology: Recent Results (from the past 240 hour(s))  SARS Coronavirus 2 by RT PCR (hospital order, performed in [facility] hospital lab) Nasopharyngeal Nasopharyngeal Swab      Status: None   Collection Time: 02/19/20  5:27 AM   Specimen: Nasopharyngeal Swab  Result Value Ref Range Status   SARS Coronavirus 2 NEGATIVE NEGATIVE Final    Comment: (NOTE) SARS-CoV-2 target nucleic acids are NOT DETECTED.  The SARS-CoV-2 RNA is generally detectable in upper and lower respiratory specimens during the acute phase of infection. The lowest concentration of SARS-CoV-2 viral copies this assay can detect is 250 copies / mL. A negative result does not preclude SARS-CoV-2 infection and should not be used as the sole basis for treatment or other patient management decisions.  A negative result may occur with improper specimen collection / handling, submission of specimen other than nasopharyngeal swab, presence of viral mutation(s) within the areas targeted by this assay, and inadequate number of viral copies (<250 copies / mL). A negative result must be combined with clinical observations, patient history, and epidemiological information.  Fact Sheet for Patients:   StrictlyIdeas.no  Fact Sheet for Healthcare Providers: BankingDealers.co.za  This test is not yet approved or  cleared by the [place] FDA and has been authorized for detection and/or diagnosis of SARS-CoV-2 by FDA under an Emergency Use Authorization (EUA).  This EUA will remain in effect (meaning this test can be used) for the duration of the COVID-19 declaration under Section 564(b)(1) of the Act, 21 U.S.C. section 360bbb-3(b)(1), unless the authorization is terminated or revoked sooner.  Performed at [facility], [place] [address] [place], [place] [address]   Culture, blood (routine x 2)     Status: Abnormal   Collection Time: 02/19/20 10:36 AM   Specimen: BLOOD RIGHT HAND  Result Value Ref Range Status   Specimen Description BLOOD RIGHT HAND  Final   Special Requests   Final    BOTTLES DRAWN AEROBIC AND ANAEROBIC Blood Culture adequate  volume   Culture  Setup Time   Final    GRAM POSITIVE COCCI IN CLUSTERS IN BOTH AEROBIC AND ANAEROBIC BOTTLES CRITICAL VALUE NOTED.  VALUE IS CONSISTENT WITH PREVIOUSLY REPORTED AND CALLED VALUE. Performed at [facility], [place] [address], [place], [place] [address]    Culture (A)  Final    STAPHYLOCOCCUS EPIDERMIDIS STAPHYLOCOCCUS HOMINIS    Report Status 02/23/2020 FINAL  Final   Organism ID, Bacteria STAPHYLOCOCCUS EPIDERMIDIS  Final   Organism ID, Bacteria STAPHYLOCOCCUS HOMINIS  Final      Susceptibility   Staphylococcus epidermidis - MIC*    CIPROFLOXACIN 4 RESISTANT Resistant     ERYTHROMYCIN >=8 RESISTANT Resistant     GENTAMICIN <=0.5 SENSITIVE Sensitive     OXACILLIN >=4 RESISTANT  Resistant     TETRACYCLINE >=16 RESISTANT Resistant     VANCOMYCIN 1 SENSITIVE Sensitive     TRIMETH/SULFA 160 RESISTANT Resistant     CLINDAMYCIN RESISTANT Resistant     RIFAMPIN <=0.5 SENSITIVE Sensitive     Inducible Clindamycin POSITIVE Resistant     * STAPHYLOCOCCUS EPIDERMIDIS   Staphylococcus hominis - MIC*    CIPROFLOXACIN <=0.5 SENSITIVE Sensitive     ERYTHROMYCIN <=0.25 SENSITIVE Sensitive     GENTAMICIN <=0.5 SENSITIVE Sensitive     OXACILLIN <=0.25 SENSITIVE Sensitive     TETRACYCLINE <=1 SENSITIVE Sensitive     VANCOMYCIN <=0.5 SENSITIVE Sensitive     TRIMETH/SULFA <=10 SENSITIVE Sensitive     CLINDAMYCIN <=0.25 SENSITIVE Sensitive     RIFAMPIN <=0.5 SENSITIVE Sensitive     Inducible Clindamycin NEGATIVE Sensitive     * STAPHYLOCOCCUS HOMINIS  Culture, blood (routine x 2)     Status: Abnormal   Collection Time: 02/19/20 10:48 AM   Specimen: BLOOD  Result Value Ref Range Status   Specimen Description BLOOD RIGHT ANTECUBITAL  Final   Special Requests   Final    BOTTLES DRAWN AEROBIC AND ANAEROBIC Blood Culture adequate volume   Culture  Setup Time   Final    GRAM POSITIVE COCCI IN CLUSTERS IN BOTH AEROBIC AND ANAEROBIC BOTTLES CRITICAL RESULT CALLED TO, READ BACK BY  AND VERIFIED WITH: PHRMD V BRYK _0  02/20/20 BY S GEZAHEGN Performed at [facility], [address], [place], [place] [address]    Culture (A)  Final    STAPHYLOCOCCUS EPIDERMIDIS STAPHYLOCOCCUS HOMINIS    Report Status 02/23/2020 FINAL  Final   Organism ID, Bacteria STAPHYLOCOCCUS EPIDERMIDIS  Final   Organism ID, Bacteria STAPHYLOCOCCUS HOMINIS  Final      Susceptibility   Staphylococcus epidermidis - MIC*    CIPROFLOXACIN >=8 RESISTANT Resistant     ERYTHROMYCIN >=8 RESISTANT Resistant     GENTAMICIN <=0.5 SENSITIVE Sensitive     OXACILLIN >=4 RESISTANT Resistant     TETRACYCLINE <=1 SENSITIVE Sensitive     VANCOMYCIN 1 SENSITIVE Sensitive     TRIMETH/SULFA 160 RESISTANT Resistant     CLINDAMYCIN RESISTANT Resistant     RIFAMPIN <=0.5 SENSITIVE Sensitive     Inducible Clindamycin POSITIVE Resistant     * STAPHYLOCOCCUS EPIDERMIDIS   Staphylococcus hominis - MIC*    CIPROFLOXACIN 4 RESISTANT Resistant     ERYTHROMYCIN >=8 RESISTANT Resistant     GENTAMICIN <=0.5 SENSITIVE Sensitive     OXACILLIN >=4 RESISTANT Resistant     TETRACYCLINE <=1 SENSITIVE Sensitive     VANCOMYCIN 1 SENSITIVE Sensitive     TRIMETH/SULFA 160 RESISTANT Resistant     CLINDAMYCIN RESISTANT Resistant     RIFAMPIN <=0.5 SENSITIVE Sensitive     Inducible Clindamycin POSITIVE Resistant     * STAPHYLOCOCCUS HOMINIS  Blood Culture ID Panel (Reflexed)     Status: Abnormal   Collection Time: 02/19/20 10:48 AM  Result Value Ref Range Status   Enterococcus species NOT DETECTED NOT DETECTED Final   Listeria monocytogenes NOT DETECTED NOT DETECTED Final   Staphylococcus species DETECTED (A) NOT DETECTED Final    Comment: Methicillin (oxacillin) resistant coagulase negative staphylococcus. Possible blood culture contaminant (unless isolated from more than one blood culture draw or clinical case suggests pathogenicity). No antibiotic treatment is indicated for blood  culture contaminants. CRITICAL RESULT  CALLED TO, READ BACK BY AND VERIFIED WITH: PHRMD V BRYK _1  02/20/20 BY S GEZAHEGN  Staphylococcus aureus (BCID) NOT DETECTED NOT DETECTED Final   Methicillin resistance DETECTED (A) NOT DETECTED Final    Comment: CRITICAL RESULT CALLED TO, READ BACK BY AND VERIFIED WITH: PHRMD V BRYK _0  02/20/20 BY S GEZAHEGN    Streptococcus species NOT DETECTED NOT DETECTED Final   Streptococcus agalactiae NOT DETECTED NOT DETECTED Final   Streptococcus pneumoniae NOT DETECTED NOT DETECTED Final   Streptococcus pyogenes NOT DETECTED NOT DETECTED Final   Acinetobacter baumannii NOT DETECTED NOT DETECTED Final   Enterobacteriaceae species NOT DETECTED NOT DETECTED Final   Enterobacter cloacae complex NOT DETECTED NOT DETECTED Final   Escherichia coli NOT DETECTED NOT DETECTED Final   Klebsiella oxytoca NOT DETECTED NOT DETECTED Final   Klebsiella pneumoniae NOT DETECTED NOT DETECTED Final   Proteus species NOT DETECTED NOT DETECTED Final   Serratia marcescens NOT DETECTED NOT DETECTED Final   Haemophilus influenzae NOT DETECTED NOT DETECTED Final   Neisseria meningitidis NOT DETECTED NOT DETECTED Final   Pseudomonas aeruginosa NOT DETECTED NOT DETECTED Final   Candida albicans NOT DETECTED NOT DETECTED Final   Candida glabrata NOT DETECTED NOT DETECTED Final   Candida krusei NOT DETECTED NOT DETECTED Final   Candida parapsilosis NOT DETECTED NOT DETECTED Final   Candida tropicalis NOT DETECTED NOT DETECTED Final    Comment: Performed at [facility], [place] [address] [place], [place] [address]  MRSA PCR Screening     Status: None   Collection Time: 02/19/20  8:59 PM   Specimen: Nasopharyngeal  Result Value Ref Range Status   MRSA by PCR NEGATIVE NEGATIVE Final    Comment:        The GeneXpert MRSA Assay (FDA approved for NASAL specimens only), is one component of a comprehensive MRSA colonization surveillance program. It is not intended to diagnose MRSA infection nor to guide  or monitor treatment for MRSA infections. Performed at [facility], [place] [address], [place], [place] [address]   Culture, blood (Routine X 2) w Reflex to ID Panel     Status: None (Preliminary result)   Collection Time: 02/20/20 10:43 AM   Specimen: BLOOD LEFT ARM  Result Value Ref Range Status   Specimen Description BLOOD LEFT ARM  Final   Special Requests   Final    BOTTLES DRAWN AEROBIC ONLY Blood Culture adequate volume   Culture   Final    NO GROWTH 3 DAYS Performed at [facility], [address], [place], [place] [address]    Report Status PENDING  Incomplete  Culture, blood (Routine X 2) w Reflex to ID Panel     Status: None (Preliminary result)   Collection Time: 02/20/20 10:48 AM   Specimen: BLOOD LEFT WRIST  Result Value Ref Range Status   Specimen Description BLOOD LEFT WRIST  Final   Special Requests   Final    BOTTLES DRAWN AEROBIC ONLY Blood Culture results may not be optimal due to an inadequate volume of blood received in culture bottles   Culture   Final    NO GROWTH 3 DAYS Performed at [facility], [place] [address], [place], [place] [address]    Report Status PENDING  Incomplete    Michel Bickers, MD [facility] [place] [phone] pager   [phone] cell 02/23/2020, 1:50 PM

## 2020-02-23 NOTE — Progress Notes (Signed)
Physical Therapy Treatment Patient Details Name: Linda Stevens MRN: [id number] DOB: [birth year] Today's Date: 02/23/2020    History of Present Illness Pt adm with acute hypoxic respiratory failure and large pericardial effusion and PNA. PMH - diastolic heart failure, pulmonary htn, copd, htn    PT Comments    Pt s/p meeting with Palliative care and very anxious about everything especially moving. Pt is limited in safe mobility by increased O2 demand, 10/10 pain in L flank in presence of decreased strength and endurance. Pt is min guard for bed mobility, transfers and 3 feet lateral side stepping along bed. Pt reports she is too week and can't walk. Pt husband in room very quiet throughout session, but reports that he will figure out a way to get her into the house. He reports they have a transport chair they can use. Hopeful for d/c home tomorrow. PT will continue to follow acutely.     Follow Up Recommendations  Home health PT           Precautions / Restrictions Precautions Precautions: Other (comment) Precaution Comments: watch SpO2    Mobility  Bed Mobility Overal bed mobility: Needs Assistance Bed Mobility: Supine to Sit     Supine to sit: Min guard     General bed mobility comments: min guard for safety, requires increased time, effort and cuing to come to EoB, complaints of dizziness   Transfers Overall transfer level: Needs assistance Equipment used: Rolling walker (2 wheeled) Transfers: Sit to/from Stand Sit to Stand: Min guard         General transfer comment: min guard for power up, Assist for lines/safety, vc for hand placement   Ambulation/Gait Ambulation/Gait assistance: Min guard Gait Distance (Feet): 3 Feet Assistive device: Rolling walker (2 wheeled) Gait Pattern/deviations: Step-through pattern;Decreased stride length Gait velocity: decr Gait velocity interpretation: <1.8 ft/sec, indicate of risk for recurrent falls General Gait Details: min  guard for lateral stepping up side of bed, pt stating "I can't do this, I can't do this" and sits back on the bet         Balance Overall balance assessment: Needs assistance Sitting-balance support: No upper extremity supported;Feet supported Sitting balance-Leahy Scale: Good     Standing balance support: No upper extremity supported Standing balance-Leahy Scale: Fair                              Cognition Arousal/Alertness: Awake/alert Behavior During Therapy: Anxious Overall Cognitive Status: Within Functional Limits for tasks assessed                                 General Comments: pt very anxious during session, with increased aprhension about moving         General Comments General comments (skin integrity, edema, etc.): Pt requires 6L O2 via  to maintain SaO2 >88%O2 with mobility today, max HR 71 bpm BP in sitting 120/71      Pertinent Vitals/Pain Pain Assessment: 0-10 Pain Score: 10-Worst pain ever Pain Location: left flank Pain Descriptors / Indicators: Sharp;Throbbing Pain Intervention(s): Limited activity within patient's tolerance;Monitored during session;Repositioned           PT Goals (current goals can now be found in the care plan section) Acute Rehab PT Goals Patient Stated Goal: return home PT Goal Formulation: With patient Time For Goal Achievement: 03/07/20 Potential to Achieve Goals: Good  Frequency    Min 3X/week      PT Plan Current plan remains appropriate       AM-PAC PT "6 Clicks" Mobility   Outcome Measure  Help needed turning from your back to your side while in a flat bed without using bedrails?: None Help needed moving from lying on your back to sitting on the side of a flat bed without using bedrails?: None Help needed moving to and from a bed to a chair (including a wheelchair)?: A Little Help needed standing up from a chair using your arms (e.g., wheelchair or bedside chair)?: A  Little Help needed to walk in hospital room?: A Little Help needed climbing 3-5 steps with a railing? : A Little 6 Click Score: 20    End of Session Equipment Utilized During Treatment: Oxygen Activity Tolerance: Patient limited by fatigue;Other (comment) (Dyspnea/SpO2,  and anxiety) Patient left: in chair;with call bell/phone within reach;with chair alarm set Nurse Communication: Mobility status PT Visit Diagnosis: Other abnormalities of gait and mobility (R26.89);Muscle weakness (generalized) (M62.81)     Time: 1425-1450 PT Time Calculation (min) (ACUTE ONLY): 25 min  Charges:  $Therapeutic Activity: 23-37 mins                     Margaree Sandhu B. Migdalia Dk PT, DPT Acute Rehabilitation Services Pager [phone] Office [phone]    [place] 02/23/2020, 4:47 PM

## 2020-02-23 NOTE — Consult Note (Addendum)
Consultation Note Date: 02/23/2020   Patient Name: Linda Stevens  DOB: 13-Jul-1946  MRN: [id number]  Age / Sex: 74 y.o., female  PCP: [name], MD Referring Physician: Mercy Riding, MD  Reason for Consultation: Establishing goals of care  HPI/Patient Profile: 74 y.o. female  with past medical history of pulmonary hypertension, hyperlipidemia, chronic respiratory failure requiring chronic 3L O2, COPD, CHF, and emphysema admitted on 02/19/2020 with acute on chronic respiratory failure with hypoxia secondary to community acquired pneumonia, pericardial effusion,  bilateral pleural effusions, and new onset a.fib.  PMT was consulted for [place] discussions.   Patient and family face treatment options decisions, advanced directive decisions, and anticipatory care needs.     Clinical Assessment and Goals of Care:  I have reviewed medical records including EPIC notes, labs and imaging, received report from primary RN, assessed the patient and then met with husband and two daughters to discuss diagnosis, prognosis, GOC, EOL wishes, disposition, and options.  Went to visit patient at bedside - [name]/husband and Wendy/daughter were at bedside. Angie/daughter was called and put over speaker phone for duration of visit.   I introduced Palliative Medicine as specialized medical care for people living with serious illness. It focuses on providing relief from the symptoms and stress of a serious illness. The goal is to improve quality of life for both the patient and the family.  Patient immediately became tearful and anxious after PMT introduction, stating "I'm scared" and "I know what this means, I'm going to die." Provided emotional support, more education, and reassurance on what Palliative Care is and why we were consulted in conjunction with her chronic illnesses. She asked if this meant she was going to die  today - explained that, even thought we are concerned about her current acute and comorbid conditions, we do not anticipate she will die today and that was not the purpose of our visit. This seemed to relax Ms. Serpas moderately.   We discussed a brief life review of the patient. Ms. Faubert is married, has two daughters, and three grandchildren.   As far as functional and nutritional status, she lives with her husband at home. She has been functionally independent, able to perform ADLs; however, [name] and [name] both express she has been weaker over the last several months - especially since the hot weather has started. She has been eating well but about 1 week prior to Ms. Sardina's admission, she has had a decreased appetite and poor PO intake.   Ms. Heuerman has been diagnosed with pulmonary hypertension for about 4 years and COPD for "a very long time."  We discussed patient's current illness and what it means in the larger context of patient's on-going co-morbidities.  Natural disease trajectory were discussed. Conversation was had with the patient and family around Ms. Boyington's respiratory failure, pericardial effusion, new onset afib, pleural effusions, PT/OT recommendations, current poor PO intake, and labs. Also discussed ID, cardiology, and attending's medical recommendations and concerns at this time in regards to her current  medical condition. All questions were answered and patient and family expressed understanding on topics listed.   I attempted to elicit values and goals of care important to the patient and family. Advance directives, concepts specific to code status, artificial feeding and hydration, and rehospitalization were considered and discussed. Ms. Bartel stated she did have a living will - encouraged to bring a copy to the hospital for our records. [name]/husband stated he would bring it back this afternoon when he returns. When on the topic if code status, the  patient expressed "I don't want to go." Discussed with patient the importance of early discussions with her family about her wishes and advanced care planning. Explained medical recommendation against an attempt at resuscitation due to the consideration of her age and underlying co-morbid conditions. A MOST form was presented and explained - copy was given to husband for further review and discussion with patient. At the end of conversation, patient does make a comment about wanting to be around for her grandchildren.    Discussed with patient/family the importance of continued conversation with patient, family, and the medical providers regarding overall plan of care and treatment options, ensuring decisions are within the context of the patient's values and GOCs.    Questions and concerns were addressed. The family was encouraged to call with questions or concerns. PMT team card was given.  Family expressed appreciation for visit today.  ADDENDUM 1500: F/u with patient and husband this afternoon. Copy of AD packet placed in chart. Patient's husband is primary 11 and two daughters are secondary HCPOA's. Patient has clearly written in her AD that she would not wish for prolonged interventions if terminal or incurable. Patient is overwhelmed and anxious with [place] discussion today and not ready to make any changes in care plan. Ongoing PMT f/u for support and discussions pending clinical course.    NEXT OF KIN  [name] [employer] - husband    SUMMARY OF RECOMMENDATIONS   -Continue to optimize current medical treatment -Continue full code status -Senna 1 tablet PO daily started for constipation -Copy of living will placed in chart.  -Complete MOST form if patient/family is interested - blank form provided for their review -Recommend outpatient Palliative to follow when discharged if patient/family is accepting -PMT will continue to follow inpatient. Ongoing discussions pending clinical course.    Code Status/Advance Care Planning:  Full code   Symptom Management:   Senna 1 tablet PO daily for constipation - patient has not had a bowel movement in at least a week  Other symptoms per attending  Palliative Prophylaxis:   Aspiration, Bowel Regimen, Delirium Protocol, Frequent Pain Assessment, Oral Care and Turn Reposition  Additional Recommendations (Limitations, Scope, Preferences):  Full Scope Treatment  Psycho-social/Spiritual:   Created space and opportunity for patient and family to express thoughts and feelings regarding patient's current medical situation.  Emotional support provided.  Prognosis:   Unable to determine: guarded  Discharge Planning: Home with Home Health with outpatient palliative to follow     Primary Diagnoses: Present on Admission: . Community acquired pneumonia . Pulmonary artery hypertension ([place]) . Acute on chronic respiratory failure with hypoxia ([place]) . Acute on chronic diastolic congestive heart failure ([place]) . Hepatic cirrhosis ([place]) . New onset atrial fibrillation ([place]) . Pericardial effusion . Positive blood cultures   I have reviewed the medical record, interviewed the patient and family, and examined the patient. The following aspects are pertinent.  Past Medical History:  Diagnosis Date  . Acute respiratory failure  with hypoxia and hypercapnea 02/26/2014  . Allergic rhinitis due to pollen   . Allergy   . Arthritis   . Asthma   . CHF (congestive heart failure) ([place])   . COPD (chronic obstructive pulmonary disease) ([place])   . Diastolic dysfunction    a. 02/2014 Echo: EF 65-70%, Gr 1 DD, RVH;  b. 09/2015 Echo: EF 55%, Gr 1 DD.  [name] Emphysema of lung ([place])   . H/O seasonal allergies   . History of chicken pox   . History of tobacco abuse    a. Quit 2015.  [name] History of UTI   . Hypertension   . Hypertensive heart disease   . Lower extremity edema   . Pericardial effusion    a. 09/2015 Echo: Mod eff w/o tamponade.  .  Right heart failure with reduced right ventricular function ([place])    a. 09/2015 Echo: EF 55%, Gr 1 DD, D shaped IV septum, sev dil RV with mod reduced fxn, mod dil RA, RV-RA grad 15mHg, PASP 84mg, mod pericard eff w/o tamponade.  . Severe Pulmonary Hypertension    a. 09/2015 Echo: PASP 8750m.   Social History   Socioeconomic History  . Marital status: Married    Spouse name: Not on file  . Number of children: Not on file  . Years of education: 2  . Highest education level: Not on file  Occupational History  . Occupation: retired teaPharmacist, hospitalobacco Use  . Smoking status: Former Smoker    Packs/day: 1.00    Years: 40.00    Pack years: 40.00    Types: Cigarettes    Quit date: 02/22/2014    Years since quitting: 6.0  . Smokeless tobacco: Never Used  Vaping Use  . Vaping Use: Never used  Substance and Sexual Activity  . Alcohol use: No  . Drug use: No  . Sexual activity: Never  Other Topics Concern  . Not on file  Social History Narrative   Married with 2 children.  Independent of ADLs.      Does not have a living will.   Would desire CPR but would not want prolonged life support if futile- husband aware.   Social Determinants of Health   Financial Resource Strain:   . Difficulty of Paying Living Expenses:   Food Insecurity:   . Worried About RunCharity fundraiser the Last Year:   . RanArboriculturist the Last Year:   Transportation Needs:   . LacFilm/video editoredical):   . LMarland Kitchenck of Transportation (Non-Medical):   Physical Activity:   . Days of Exercise per Week:   . Minutes of Exercise per Session:   Stress:   . Feeling of Stress :   Social Connections:   . Frequency of Communication with Friends and Family:   . Frequency of Social Gatherings with Friends and Family:   . Attends Religious Services:   . Active Member of Clubs or Organizations:   . Attends CluArchivistetings:   . MMarland Kitchenrital Status:    Family History  Problem Relation Age of Onset    . Glaucoma Brother   . Vascular Disease Father        Died of complications from surgery  . Heart attack Father   . Asthma Mother        Died of asthma attack  . Dementia Mother   . Hyperlipidemia Mother   . Hypertension Mother    Scheduled Meds: . acidophilus  1 capsule Oral Daily  . ambrisentan  10 mg Oral Daily  . aspirin EC  81 mg Oral Daily  . colchicine  0.3 mg Oral Daily  . enoxaparin (LOVENOX) injection  40 mg Subcutaneous Q24H  . fluticasone  2 spray Each Nare Daily  . umeclidinium bromide  2 puff Inhalation Daily   And  . fluticasone furoate-vilanterol  2 puff Inhalation Daily  . guaiFENesin  600 mg Oral BID  . methylPREDNISolone (SOLU-MEDROL) injection  60 mg Intravenous Q24H  . senna  1 tablet Oral Daily  . tadalafil (PAH)  40 mg Oral Daily   Continuous Infusions: . amiodarone 30 mg/hr (02/23/20 1059)   PRN Meds:.acetaminophen **OR** acetaminophen, morphine injection, ondansetron **OR** ondansetron (ZOFRAN) IV, promethazine, traMADol Medications Prior to Admission:  Prior to Admission medications   Medication Sig Start Date End Date Taking? Authorizing Provider  acetaminophen (TYLENOL) 325 MG tablet Take 325 mg by mouth at bedtime as needed for moderate pain or headache.    Yes [provider]  albuterol (PROVENTIL HFA;VENTOLIN HFA) 108 (90 Base) MCG/ACT inhaler Inhale 2 puffs every 6 (six) hours as needed into the lungs for wheezing or shortness of breath. 07/17/17  Yes Tanda Rockers, MD  ambrisentan (LETAIRIS) 10 MG tablet TAKE 1 TABLET (10MG) BY MOUTH DAILY. DO NOT HANDLE IF PREGNANT. DO NOTSPLIT, CRUSH, OR CHEW. AVOID INHALATION AND CONTACT WITH SKIN OR EYES . CALL [phone] TO REFILL. Patient taking differently: Take 10 mg by mouth daily. [name] CALL [phone] TO REFILL. 05/12/19  Yes Bensimhon, Shaune Pascal, MD  aspirin EC 81 MG EC tablet Take 1 tablet (81 mg total) by mouth daily. 10/09/15  Yes Debbe Odea, MD  azithromycin (ZITHROMAX) 250 MG tablet  Take 2 on day one then 1 daily x 4 days Patient taking differently: Take 250-500 mg by mouth See admin instructions. Take 2 tablets on day 1 then take 1 tablet daily for 4 days 02/13/20  Yes Tanda Rockers, MD  clotrimazole (MYCELEX) 10 MG troche Take 1 tablet (10 mg total) by mouth 5 (five) times daily. 02/13/20  Yes Tanda Rockers, MD  fluticasone (FLONASE) 50 MCG/ACT nasal spray USE 2 SPRAYS IN EAXH NOSTRIL ONCE A DAY Patient taking differently: Place 2 sprays into both nostrils daily.  08/29/19  Yes Tanda Rockers, MD  furosemide (LASIX) 40 MG tablet TAKE 1 TABLET BY MOUTH TWICE A DAY Patient taking differently: Take 40 mg by mouth 2 (two) times daily.  12/12/19  Yes Bensimhon, Shaune Pascal, MD  ipratropium (ATROVENT) 0.06 % nasal spray PLACE 2 SPRAYS INTO BOTH NOSTRILS 2 (TWO) TIMES DAILY 01/24/20  Yes Bensimhon, Shaune Pascal, MD  Polyethyl Glycol-Propyl Glycol (SYSTANE OP) Apply 1-2 drops to eye daily as needed (dry eyes).   Yes [provider]  predniSONE (DELTASONE) 10 MG tablet Take  4 each am x 2 days,   2 each am x 2 days,  1 each am x 2 days and stop Patient taking differently: Take 10-40 mg by mouth See admin instructions. Take 4 tablets every morning for 2 days then take 2 tablets every morning for 2 days then take 1 tablet daily for 2 days then stop 02/13/20  Yes Tanda Rockers, MD  spironolactone (ALDACTONE) 25 MG tablet TAKE 1/2 TABLET BY MOUTH EVERY DAY Patient taking differently: Take 12.5 mg by mouth daily.  10/17/19  Yes Bensimhon, Shaune Pascal, MD  tadalafil, PAH, (ADCIRCA) 20 MG tablet Take 2 tablets (40 mg total) by mouth  daily. 05/12/19  Yes Bensimhon, Shaune Pascal, MD  TRELEGY ELLIPTA 100-62.5-25 MCG/INH AEPB INHALE 2 PUFFS INTO THE LUNGS EVERY DAY Patient taking differently: Inhale 2 puffs into the lungs daily.  11/17/19  Yes Tanda Rockers, MD  OXYGEN 2 to 2lpm 24/7    [provider]   Allergies  Allergen Reactions  . Amoxicillin-Pot Clavulanate     REACTION: gi upset Has  patient had a PCN reaction causing immediate rash, facial/tongue/throat swelling, SOB or lightheadedness with hypotension: No Has patient had a PCN reaction causing severe rash involving mucus membranes or skin necrosis: No Has patient had a PCN reaction that required hospitalization : No Has patient had a PCN reaction occurring within the last 10 years: No If all of the above answers are "NO", then may proceed with Cephalosporin use.   . Cefdinir     REACTION: gi  upset  . Moxifloxacin     REACTION: rash  . Singulair [Montelukast Sodium]     Itching    Review of Systems  Respiratory: Negative for shortness of breath.   Gastrointestinal: Positive for constipation and nausea.    Physical Exam Vitals and nursing note reviewed.  Pulmonary:     Effort: Pulmonary effort is normal. No respiratory distress.  Skin:    General: Skin is warm and dry.  Neurological:     Mental Status: She is alert.     Motor: Weakness present.     Comments: At times falling asleep during conversation  Psychiatric:        Mood and Affect: Mood is anxious. Affect is tearful.        Behavior: Behavior is cooperative.     Vital Signs: BP (!) 106/47 (BP Location: Left Arm)   Pulse 70   Temp 98.7 F (37.1 C) (Oral)   Resp 14   Ht _0  (1.6 m)   Wt 65.6 kg   SpO2 95%   BMI 25.62 kg/m  Pain Scale: 0-10 POSS *See Group Information*: 1-Acceptable,Awake and alert Pain Score: 0-No pain   SpO2: SpO2: 95 % O2 Device:SpO2: 95 % O2 Flow Rate: .O2 Flow Rate (L/min): 4 L/min  IO: Intake/output summary:   Intake/Output Summary (Last 24 hours) at 02/23/2020 1408 Last data filed at 02/23/2020 0700 Gross per 24 hour  Intake 640.69 ml  Output 300 ml  Net 340.69 ml    LBM: Last BM Date:  (PTA) Baseline Weight: Weight: 61.2 kg Most recent weight: Weight: 65.6 kg     Palliative Assessment/Data: PPS 50%   Flowsheet Rows     Most Recent Value  Intake Tab  Referral Department Hospitalist  Unit at  Time of Referral Intermediate Care Unit  Palliative Care Primary Diagnosis Other (Comment)  [cardiopulmonary]  Palliative Care Type New Palliative care  Reason for referral Clarify Goals of Care  Date first seen by Palliative Care 02/23/20  Clinical Assessment  Palliative Performance Scale Score 50%  Psychosocial & Spiritual Assessment  Palliative Care Outcomes  Patient/Family meeting held? Yes  Who was at the meeting? patient, husband, daughters [name] and [name] on speakerphone)  Palliative Care Outcomes Clarified goals of care, Provided psychosocial or spiritual support, ACP counseling assistance     Discussed case with primary RN, Dr. Cyndia Skeeters, husband, two daughters  Time In/Out: 0930-1040, 1500-1510 Time Total: 51 Greater than 50%  of this time was spent counseling and coordinating care related to the above assessment and plan.  Signed by:  Amber M. Tamala Julian, FNP-BC  Ihor Dow,  DNP, FNP-C Palliative Medicine Team  Phone: [phone] Fax: [phone]   Please contact Palliative Medicine Team phone at [phone] for questions and concerns.  For individual provider: See Shea Evans

## 2020-02-23 NOTE — Progress Notes (Signed)
Patient assessed for conversion to NSR, EKG obtained and placed in chart. Will continue to monitor.   Elaina Hoops, RN

## 2020-02-23 NOTE — Care Management Important Message (Signed)
Important Message  Patient Details  Name: Linda Stevens MRN: [id number] Date of Birth: 1946/02/21   Medicare Important Message Given:  Yes     Shelda Altes 02/23/2020, 9:50 AM

## 2020-02-23 NOTE — Plan of Care (Signed)
  Problem: Education: Goal: Knowledge of General Education information will improve Description: Including pain rating scale, medication(s)/side effects and non-pharmacologic comfort measures Outcome: Progressing

## 2020-02-23 NOTE — Progress Notes (Addendum)
Advanced Heart Failure Rounding Note  PCP-Cardiologist: [name], MD   Subjective:    Repeat Echo 6/15 showed pericardial effusion to be a bit smaller but still large. Hemodynamically stable.   PAF this admit but currently NSR on tele. Remains on amio gtt at 30 mg/hr.   Appears more fatigue today. Reports she does not feel as well as yesterday. Breathing ok. O2 sats stable. A&Ox3 and follows commands but response is slower. AF. WBC normal.  Does not have an appetite. Husband present at bedside.      Objective:   Weight Range: 65.6 kg Body mass index is 25.62 kg/m.   Vital Signs:   Temp:  [97.5 F (36.4 C)-98.7 F (37.1 C)] 98.7 F (37.1 C) (06/17 0726) Pulse Rate:  [56-124] 72 (06/17 0726) Resp:  [11-16] 16 (06/17 0726) BP: (96-130)/(48-79) 106/48 (06/17 0726) SpO2:  [91 %-95 %] 91 % (06/17 0757) Weight:  [62.1 kg-65.6 kg] 65.6 kg (06/17 0300) Last BM Date:  (PTA)  Weight change: Filed Weights   02/19/20 2055 02/22/20 1220 02/23/20 0300  Weight: 63 kg 62.1 kg 65.6 kg    Intake/Output:   Intake/Output Summary (Last 24 hours) at 02/23/2020 0923 Last data filed at 02/23/2020 0700 Gross per 24 hour  Intake 640.69 ml  Output 300 ml  Net 340.69 ml      Physical Exam    General:  Thin chronically ill appearing. Looks fatigued.  No resp difficulty HEENT: Normal Neck: Supple. JVP elevated to ear . Carotids 2+ bilat; no bruits. No lymphadenopathy or thyromegaly appreciated. Cor: PMI nondisplaced. Regular rate & rhythm. No rubs, gallops or murmurs. Lungs: faint crackles at the bases bilaterally  Abdomen: Soft, nontender, nondistended. No hepatosplenomegaly. No bruits or masses. Good bowel sounds. Extremities: No cyanosis, clubbing, rash, edema Neuro: Alert & orientedx3, cranial nerves grossly intact. moves all 4 extremities w/o difficulty. Flat affect. Slow response to commands    Telemetry   NSR, 60s-70s currently. PAF this admit.   EKG    No new  EKG to review today   Labs    CBC Recent Labs    02/22/20 0259 02/23/20 0339  WBC 11.2* 9.8  HGB 9.7* 9.8*  HCT 30.9* 31.5*  MCV 99.4 101.0*  PLT 234 921   Basic Metabolic Panel Recent Labs    02/22/20 0259 02/23/20 0339  NA 139 140  K 3.6 4.0  CL 90* 90*  CO2 38* 40*  GLUCOSE 129* 119*  BUN 54* 58*  CREATININE 1.39* 1.35*  CALCIUM 8.5* 8.6*  MG 2.7* 3.2*  PHOS 4.8* 4.4   Liver Function Tests Recent Labs    02/22/20 0259 02/23/20 0339  ALBUMIN 2.7* 2.7*   No results for input(s): LIPASE, AMYLASE in the last 72 hours. Cardiac Enzymes No results for input(s): CKTOTAL, CKMB, CKMBINDEX, TROPONINI in the last 72 hours.  BNP: BNP (last 3 results) Recent Labs    05/12/19 1224 12/09/19 1239 02/19/20 0405  BNP 272.8* 324.2* 164.0*    ProBNP (last 3 results) No results for input(s): PROBNP in the last 8760 hours.   D-Dimer No results for input(s): DDIMER in the last 72 hours. Hemoglobin A1C No results for input(s): HGBA1C in the last 72 hours. Fasting Lipid Panel No results for input(s): CHOL, HDL, LDLCALC, TRIG, CHOLHDL, LDLDIRECT in the last 72 hours. Thyroid Function Tests No results for input(s): TSH, T4TOTAL, T3FREE, THYROIDAB in the last 72 hours.  Invalid input(s): FREET3  Other results:   Imaging  No results found.   Medications:     Scheduled Medications: . acidophilus  1 capsule Oral Daily  . ambrisentan  10 mg Oral Daily  . aspirin EC  81 mg Oral Daily  . colchicine  0.3 mg Oral Daily  . enoxaparin (LOVENOX) injection  40 mg Subcutaneous Q24H  . fluticasone  2 spray Each Nare Daily  . umeclidinium bromide  2 puff Inhalation Daily   And  . fluticasone furoate-vilanterol  2 puff Inhalation Daily  . guaiFENesin  600 mg Oral BID  . methylPREDNISolone (SOLU-MEDROL) injection  60 mg Intravenous Q24H  . tadalafil (PAH)  40 mg Oral Daily    Infusions: . amiodarone 30 mg/hr (02/22/20 2239)  . vancomycin 1,000 mg (02/23/20 0611)     PRN Medications: acetaminophen **OR** acetaminophen, morphine injection, ondansetron **OR** ondansetron (ZOFRAN) IV, promethazine, traMADol   Assessment/Plan   1.  A/C Hypoxic Respiratory Failure/ possible pneumonia / AECOPD?  Oxygen requirement on admit. O2 increased to 6 liters. Now back down on 3 liters.  Sats stable.  - Lactic Acid 3.4>2.2>2.1  - WBC 17.9>15.8 >11.2>9.8 - Sed Rate 63.  - Initial Blood CX + for Staph epidermidis and staph hominis ? Contaminates.  - Blood CX repeated 6/14. NGTD - Rocephin stopped 6/14.  - On Vancomycin  + solumedrol  - ID following  2. Pericardial Effusion  - Large pericardial effusion on ECHO and CTA.  - Repeat ECHO  6/15: Pericardial effusion seems a bit smaller but still large.  - hemodynamically stable currently. No indication for pericardiocentesis or window currently. Plan repeat echo on 6/18 - Rheum serologies remain surprisingly negative. Need to consider other etiologies for effusion and pleuritic CP (? Infectious/viral). Possible inflammatory process. ESR elevated at 63. Continue colchicine  - ID following    3. Pulmonary HTN - On sildenafil + letaris , continue - On ECHO PA pressure 87 mm hg    4. Chronic Diastolic HF  - ECHO this admit preserved LVEF , Grade IDD - diuretics have been on hold but wt starting to trend up on steroids.  -  CO2 40. ABG pending. May benefit from a dose of diamox   5. PAF -Started on amio drip 6/14- converted to NSR.  -Reverted back to Afib w/ RVR 6/15. Converted back to NSR w/ amio bolus - NSR currently, HR 60s - Continue amiodarone gtt at 30 mg/hr   -No AC at this time with risk of hemorrhagic transformation of effusion.   6. AKI  Creatinine trend  1>1.4 >1.5>1.4>1.35  - continue to follow BMP   7. AMS - A&Ox3 but slower response to commands. AF. WBC normal - check UA - CO2 40. ABG pending. May benefit from diamox   - w/ PAF and no anticoagulation, may need brain imaging  - will  follow closely   Length of Stay: [address] Rosita Fire, PA-C  02/23/2020, 9:23 AM  Advanced Heart Failure Team Pager [phone] (M-F; 7a - 4p)  Please contact [facility] for night-coverage after hours (4p -7a ) and weekends on [email or web address]   Patient seen and examined with the above-signed Advanced Practice Provider and/or Housestaff. I personally reviewed laboratory data, imaging studies and relevant notes. I independently examined the patient and formulated the important aspects of the plan. I have edited the note to reflect any of my changes or salient points. I have personally discussed the plan with the patient and/or family.  Was not feeling well this am. More lethargic. ABG drawn and  shows chronic CO2 retention which is new from previous.  More alert this afternoon. Remains in NSR on IV amio. BP stable. Still c/o pleuritic CP. ID feels Bcx are contaminants  General:  Anxious No resp difficulty HEENT: normal Neck: supple. JVP 9-10 Carotids 2+ bilat; no bruits. No lymphadenopathy or thryomegaly appreciated. Cor: PMI nondisplaced. Regular rate & rhythm. No rubs, gallops or murmurs. Lungs: decreased throughout Abdomen: soft, nontender, nondistended. No hepatosplenomegaly. No bruits or masses. Good bowel sounds. Extremities: no cyanosis, clubbing, rash, edema Neuro: alert & orientedx3, cranial nerves grossly intact. moves all 4 extremities w/o difficulty. Affect pleasant  Stable from hemodynamic standpoint today. ABG shows chronic hypercapneic respiratory failure - though I wonder if not slightly worse due to splinting from pleuritic pain. Remains in NSR on IV amio. Continue steroids. Plan repeat echo in am. If pericardial effusion stable/improved, likely can go home soon from our standpoint with close f/u.   [name], MD  6:40 PM

## 2020-02-24 ENCOUNTER — Encounter: Payer: Medicare HMO | Admitting: Family Medicine

## 2020-02-24 ENCOUNTER — Inpatient Hospital Stay (HOSPITAL_COMMUNITY): Payer: Medicare HMO

## 2020-02-24 DIAGNOSIS — I313 Pericardial effusion (noninflammatory): Secondary | ICD-10-CM

## 2020-02-24 DIAGNOSIS — R0789 Other chest pain: Secondary | ICD-10-CM

## 2020-02-24 LAB — RENAL FUNCTION PANEL
Albumin: 2.6 g/dL — ABNORMAL LOW (ref 3.5–5.0)
Anion gap: 10 (ref 5–15)
BUN: 55 mg/dL — ABNORMAL HIGH (ref 8–23)
CO2: 41 mmol/L — ABNORMAL HIGH (ref 22–32)
Calcium: 8.5 mg/dL — ABNORMAL LOW (ref 8.9–10.3)
Chloride: 91 mmol/L — ABNORMAL LOW (ref 98–111)
Creatinine, Ser: 1.32 mg/dL — ABNORMAL HIGH (ref 0.44–1.00)
GFR calc Af Amer: 47 mL/min — ABNORMAL LOW (ref >60)
GFR calc non Af Amer: 40 mL/min — ABNORMAL LOW (ref >60)
Glucose, Bld: 108 mg/dL — ABNORMAL HIGH (ref 70–99)
Phosphorus: 3.7 mg/dL (ref 2.5–4.6)
Potassium: 3.6 mmol/L (ref 3.5–5.1)
Sodium: 142 mmol/L (ref 135–145)

## 2020-02-24 LAB — MAGNESIUM: Magnesium: 3 mg/dL — ABNORMAL HIGH (ref 1.7–2.4)

## 2020-02-24 LAB — LEGIONELLA PNEUMOPHILA SEROGP 1 UR AG: L. pneumophila Serogp 1 Ur Ag: NEGATIVE

## 2020-02-24 LAB — ECHOCARDIOGRAM LIMITED
Height: 63 in
Weight: 2296.31 oz

## 2020-02-24 LAB — AMMONIA: Ammonia: 34 umol/L (ref 9–35)

## 2020-02-24 MED ORDER — NYSTATIN 100000 UNIT/ML MT SUSP
5.0000 mL | Freq: Four times a day (QID) | OROMUCOSAL | Status: DC
  Administered 2020-02-24 – 2020-03-10 (×61): 500000 [IU] via ORAL
  Filled 2020-02-24 (×61): qty 5

## 2020-02-24 MED ORDER — LACTULOSE 10 GM/15ML PO SOLN
20.0000 g | Freq: Two times a day (BID) | ORAL | Status: AC
  Administered 2020-02-24 (×2): 20 g via ORAL
  Filled 2020-02-24 (×2): qty 30

## 2020-02-24 MED ORDER — LIDOCAINE 5 % EX PTCH
1.0000 | MEDICATED_PATCH | CUTANEOUS | Status: DC
  Administered 2020-02-24 – 2020-03-10 (×16): 1 via TRANSDERMAL
  Filled 2020-02-24 (×16): qty 1

## 2020-02-24 MED ORDER — AMIODARONE HCL 200 MG PO TABS
200.0000 mg | ORAL_TABLET | Freq: Every day | ORAL | Status: DC
  Administered 2020-02-24 – 2020-03-10 (×16): 200 mg via ORAL
  Filled 2020-02-24 (×16): qty 1

## 2020-02-24 MED ORDER — ASPIRIN 81 MG PO CHEW: 81.0000 mg | CHEWABLE_TABLET | Freq: Once | ORAL | Status: DC

## 2020-02-24 MED ORDER — APIXABAN 5 MG PO TABS
5.0000 mg | ORAL_TABLET | Freq: Two times a day (BID) | ORAL | Status: DC
  Administered 2020-02-24 – 2020-03-10 (×30): 5 mg via ORAL
  Filled 2020-02-24 (×30): qty 1

## 2020-02-24 MED ORDER — FUROSEMIDE 40 MG PO TABS
40.0000 mg | ORAL_TABLET | Freq: Every day | ORAL | Status: DC
  Administered 2020-02-24 – 2020-03-10 (×15): 40 mg via ORAL
  Filled 2020-02-24 (×16): qty 1

## 2020-02-24 MED ORDER — POTASSIUM CHLORIDE CRYS ER 20 MEQ PO TBCR
40.0000 meq | EXTENDED_RELEASE_TABLET | Freq: Once | ORAL | Status: AC
  Administered 2020-02-24: 40 meq via ORAL
  Filled 2020-02-24: qty 2

## 2020-02-24 MED ORDER — ACETAZOLAMIDE 250 MG PO TABS: 250.0000 mg | ORAL_TABLET | Freq: Two times a day (BID) | ORAL | Status: DC

## 2020-02-24 MED ORDER — ACETAZOLAMIDE 250 MG PO TABS
250.0000 mg | ORAL_TABLET | Freq: Two times a day (BID) | ORAL | Status: DC
  Filled 2020-02-24: qty 1

## 2020-02-24 NOTE — Progress Notes (Signed)
  Echocardiogram 2D Echocardiogram has been performed.  Jennette Dubin 02/24/2020, 10:25 AM

## 2020-02-24 NOTE — Progress Notes (Signed)
Occupational Therapy Treatment Patient Details Name: Linda Stevens MRN: [id number] DOB: 06/06/1946 Today's Date: 02/24/2020    History of present illness Pt adm with acute hypoxic respiratory failure and large pericardial effusion and PNA. PMH - diastolic heart failure, pulmonary htn, copd, htn   OT comments  Patient continues to make steady progress towards goals in skilled OT session. Patient's session encompassed co-treat with PT due to increased anxiety, and progressive weakness over the last few days (pt unable to ambulate yesterday with PT). Pt remains extremely anxious in session and therefore is self-limiting requiring increased time, max encouragement, and step by step instruction for calm affect. Pt able to complete 4 sit<>stands in stedy with min-mod A, however had two bouts of bowel and bladder incontinence hindering progress. Due to anxiety, pt unable to complete peri-care (total A) or basic grooming tasks and required redirection throughout the entire session to maintain positive mindset. Discharge remains appropriate at this time, will continue to follow acutely.    Follow Up Recommendations  Home health OT;Supervision/Assistance - 24 hour    Equipment Recommendations  Tub/shower seat    Recommendations for Other Services      Precautions / Restrictions Precautions Precautions: Other (comment) Precaution Comments: watch SpO2       Mobility Bed Mobility Overal bed mobility: Needs Assistance Bed Mobility: Supine to Sit     Supine to sit: Min assist     General bed mobility comments: Min A to EOB required increased cues and extra time, as well as increased reassurance  Transfers Overall transfer level: Needs assistance   Transfers: Sit to/from Stand Sit to Stand: Mod assist         General transfer comment: Completed sit<>stands in session x4, noted to require increased assist the longer pt sat on BSC (to be expected) requiring max verbal cues and  reassurance with each attempt    Balance Overall balance assessment: Needs assistance Sitting-balance support: No upper extremity supported;Feet supported Sitting balance-Leahy Scale: Good Sitting balance - Comments: Once positioned, pt is able to maintain balance, but anxiety is limiting factor   Standing balance support: Bilateral upper extremity supported Standing balance-Leahy Scale: Poor Standing balance comment: Able to statically stand in stedy for peri-care, too anxious to complete dynamic balance                           ADL either performed or assessed with clinical judgement   ADL Overall ADL's : Needs assistance/impaired     Grooming: Set up;Supervision/safety;Sitting                   Toilet Transfer: BSC;Moderate assistance;+2 for safety/equipment;Cueing for safety Toilet Transfer Details (indicate cue type and reason): Mod A with use of stedy due to weakness over the past few days, required significant extra time to have bowel movement, incontinent with urine while standing x2 Toileting- Clothing Manipulation and Hygiene: Total assistance Toileting - Clothing Manipulation Details (indicate cue type and reason): Due to increased anxiety, pt unable to attempt to complete peri-care once finished on BSC, required max reassurance to date     Functional mobility during ADLs: Moderate assistance;Maximal assistance General ADL Comments: Pt continues to demonstrate weakness, requring stedy to transfer to [facility] and chair, pt is extremely anxious and requires max encouragement     Vision       Perception     Praxis      Cognition Arousal/Alertness: Awake/alert Behavior During Therapy: Anxious  Overall Cognitive Status: Within Functional Limits for tasks assessed                                 General Comments: pt very anxious during session, with increased aprhension about moving requiring max encouragement and reassurance throughout  entirety of session often asking "did I do good?"        Exercises     Shoulder Instructions       General Comments      Pertinent Vitals/ Pain       Pain Assessment: Faces Faces Pain Scale: Hurts a little bit Pain Location: Generalized Pain Descriptors / Indicators: Grimacing Pain Intervention(s): Limited activity within patient's tolerance;Monitored during session;Repositioned  Home Living                                          Prior Functioning/Environment              Frequency  Min 2X/week        Progress Toward Goals  OT Goals(current goals can now be found in the care plan section)  Progress towards OT goals: Progressing toward goals  Acute Rehab OT Goals Patient Stated Goal: return home OT Goal Formulation: With patient Time For Goal Achievement: 03/07/20 Potential to Achieve Goals: [place] Discharge plan remains appropriate    Co-evaluation    PT/OT/SLP Co-Evaluation/Treatment: Yes Reason for Co-Treatment: Complexity of the patient's impairments (multi-system involvement);Necessary to address cognition/behavior during functional activity;For patient/therapist safety;To address functional/ADL transfers PT goals addressed during session: Mobility/safety with mobility;Balance;Strengthening/ROM OT goals addressed during session: ADL's and self-care;Strengthening/ROM      AM-PAC OT "6 Clicks" Daily Activity     Outcome Measure   Help from another person eating meals?: A Little Help from another person taking care of personal grooming?: A Little Help from another person toileting, which includes using toliet, bedpan, or urinal?: A Lot Help from another person bathing (including washing, rinsing, drying)?: A Lot Help from another person to put on and taking off regular upper body clothing?: A Lot Help from another person to put on and taking off regular lower body clothing?: Total 6 Click Score: 13    End of Session  Equipment Utilized During Treatment: Gait belt;Oxygen;Other (comment) Charlaine Dalton)  OT Visit Diagnosis: Muscle weakness (generalized) (M62.81);Unsteadiness on feet (R26.81);Other (comment) (Decreased activity tolerance)   Activity Tolerance Patient tolerated treatment well;Patient limited by fatigue   Patient Left in chair;with call bell/phone within reach;with family/visitor present   Nurse Communication Mobility status        Time: 8934-0684 OT Time Calculation (min): 51 min  Charges: OT Treatments $Self Care/Home Management : 23-37 mins  Corinne Ports E. Jezelle Gullick, COTA/L Acute [facility] [place] 02/24/2020, 3:11 PM

## 2020-02-24 NOTE — TOC Benefit Eligibility Note (Signed)
Transition of Care [facility]) Benefit Eligibility Note    Patient Details  Name: Linda Stevens MRN: [id number] Date of Birth: April 27, 1946   Medication/Dose: Eliquis  Covered?: Yes  Tier: 3 Drug  Prescription Coverage Preferred Pharmacy: [facility], [facility], [facility], and [facility]  Spoke with Person/Company/Phone Number:: [employer]  Co-Pay: $79.05 for 90 day mail order/ $26.36 for 30 day retail  Prior Approval: No  Deductible:  (unable to provide deducr\tible information)       Delorse Lek Phone Number: 02/24/2020, 3:10 PM

## 2020-02-24 NOTE — Progress Notes (Signed)
PROGRESS NOTE  Linda Stevens ZCH:[id number] DOB: May 22, 1946   PCP: [name], MD  Patient is from: Home.  DOA: 02/19/2020 LOS: 5  Brief Narrative / Interim history: 73 year old female with history of COPD/chronic RF on 3 L, diastolic CHF, pulmonary HTN, HTN and HLD presenting with progressive shortness of breath for 1 week.  Tried with a course of Z-Pak and a steroid taper by her pulmonologist on 6/9 but continues to have progressive shortness of breath.  In ED, slightly hypotensive.  Required 6 L by [place].  WBC 11.4.  AST 90.  Total bili 1.9.  Lactic acid 2.8.  BNP 164.  Trop negative x2.  CXR concerning for cardiomegaly with small pleural effusion and by basilar atelectasis.  CTA chest showed sizable pericardial effusion, bilateral pleural effusions and consolidation concerning for pneumonia and pulmonary HTN.  Cultures drawn.  Received IV Solu-Medrol, morphine, Rocephin and azithromycin,and admitted for acute on chronic respiratory failure due to pneumonia, pleural effusions and pericardial effusion.  Cardiology consulted for pericardial effusion.  Echo obtained and revealed dilated and fixed IVC although no other features of tamponade.  The next day, blood cultures with GPC's in clusters in 4/4.  BICD with coag negative MR staph.  Vancomycin added.  ID consulted.  Azithromycin and ceftriaxone discontinued.  Repeat blood culture obtained and NGTD. Eventually, vancomycin discontinued as susceptibilities on initial blood culture were different suggesting contamination versus true bacteremia.  Patient went into A. fib with RVR with hypotension on 6/14.  Started on amiodarone drip and converted to sinus rhythm.  Advanced heart failure team and palliative medicine involved as well.  Subjective: Seen and examined earlier this morning.  No major events overnight or this morning.  She is states not feeling good.  Also complains of left-sided rib pain which is a main concern now.  Pain is  reproducible with palpation.  Unclear about provoking or alleviating factors other than pain medication.  Breathing is about the same, "sometimes better sometimes worse".   Objective: Vitals:   02/23/20 2340 02/24/20 0312 02/24/20 0851 02/24/20 1304  BP: (!) 96/44 (!) 105/51 (!) 106/48 (!) 108/46  Pulse: 64 62 63 65  Resp: _0 Temp: 97.9 F (36.6 C) 97.7 F (36.5 C) 97.8 F (36.6 C) 98.1 F (36.7 C)  TempSrc: Oral Oral Oral Oral  SpO2: 96% 95% (!) 87% 90%  Weight:  65.1 kg    Height:        Intake/Output Summary (Last 24 hours) at 02/24/2020 1429 Last data filed at 02/23/2020 1700 Gross per 24 hour  Intake --  Output 100 ml  Net -100 ml   Filed Weights   02/22/20 1220 02/23/20 0300 02/24/20 0312  Weight: 62.1 kg 65.6 kg 65.1 kg    Examination:.  GENERAL: No apparent distress.  Nontoxic. HEENT: MMM.  Vision and hearing grossly intact.  NECK: Supple.  Prominent JVD. RESP: 95% on no IWOB.  Rhonchi and bibasilar crackles. CVS:  RRR. Heart sounds normal.  ABD/GI/GU: BS+. Abd soft, NTND.  MSK/EXT:  Moves extremities.  No edema.  TTP over left chest. SKIN: no apparent skin lesion or wound NEURO: Awake, alert and oriented fairly.  No apparent focal neuro deficit. PSYCH: Calm. Normal affect.  Procedures:  None  Microbiology summarized: COVID-19 PCR negative. MRSA PCR negative. Blood cultures with GPC's in clusters in 4/4. BICD with coag negative methicillin-resistant staph with different susceptibilities deemed to be contaminant.  Assessment & Plan: Acute metabolic encephalopathy: Oriented to  self, place, person and month.  Slow to respond to questions.  She has hypercarbia to 79 but well compensated.  Ammonia slightly elevated to 52>34.  TSH and B12 within normal.  Morphine, tramadol and Phenergan could contribute. -Order scheduled Tylenol.  May have to wean off opiates and avoid Phenergan. -Give 2 doses doses of lactulose and stop. -CT head and intermittent  BiPAP if no improvement -Reorientation and delirium precautions.  Abnormal blood culture versus bacteremia-initial blood cultures with methicillin-resistant Streptococcus epidermidis and homnis with different susceptibilities deemed to be contaminant..   Repeat blood culture on 6/14 NGTD.  TTE without vegetation.  Unclear.  Initial blood culture could be contamination.  Leukocytosis improving. -Appreciate ID guidance-recommended discontinue vancomycin and signed off. -Continue vancomycin 6/14> 6/17 -Ceftriaxone and azithromycin discontinued.   Community-acquired pneumonia? CTA concerning for bibasilar consolidation with pleural effusion but significant diagnosis given other confounding factors. -Received vancomycin, ceftriaxone and azithromycin as above. -Off antibiotics now per ID recommendation -Incentive spirometry and flutter valve  Acute on chronic respiratory failure with hypoxia: Multifactorial including pericardiac and pleural effusion, pulmonary hypertension, underlying COPD, possible pneumonia and A. fib with RVR.  On 3 L at baseline. ABG suggestive for well compensated chronic respiratory acidosis. -Wean oxygen as able-minimum oxygen to keep saturation above 90% -Treat potential etiologies  Pericardial effusion: Reported as sizable on initial Echo that also revealed dilated and fixated IVC but no other features of tamponade.  Repeat limited echo on 6/18 with "small circumferential pericardial effusion with no evidence of tamponade, and thickened epicardial layer consistent with inflammatory pericardial disease".  She has prominent JVD on exam.  ESR 63.  Rheumatologic exam negative so far. -Cardiology managing. -Continue colchicine.  Pleural effusion: Looks a small on review of CTA chest -Continue antibiotics as above  Pulmonary hypertension: Felt to be secondary in the setting of underlying lung disease.  PAPP 87 mmHg.  RVSP 50.8 on limited echo on 6/18. -Advanced heart failure  team following. -On sildenafil and ambrisentan  A. fib with RVR: Noted to have A. fib on EKG obtained by EMS.  Went into RVR on 6/14 and 6/15.  Converted to sinus rhythm with amiodarone drip. -Amiodarone per cardiology -Defer anticoagulation to cardiology. -Optimize Mg and K.  Chronic diastolic CHF: Echo with preserved EF and G1 DD.  BNP lower than baseline.  Reports good urine output although not captured.  Creatinine improving.  I&O incomplete.  Diuretics on hold per cardiology.  -Monitor fluid status, renal function and electrolytes -Defer diuretics to cardiology.  AKI/azotemia: Baseline Cr 0.8-0.9> 1.01 (admit)> > 1.49> 1.33.  BUN 43>54>58.  Likely hemodynamically mediated. -Continue monitoring -Avoid nephrotoxic meds  Metabolic alkalosis: Likely compensation for respiratory acidosis -Diuretics on hold -Continue monitoring.  Hyperphosphatemia: Likely due to renal failure.  Resolved. -Continue monitoring  Essential hypertension: Soft blood pressures. -Continue monitoring  Elevated liver enzymes/hyperbilirubinemia: Likely congestive hepatopathy.  Improved. Liver cirrhosis-due to pulmonary hypertension/heart failure? -Continue monitoring.  Normocytic anemia: Baseline Hgb 11-12> 12.0 (admit)>>> 9.7.  Suspect hemodilution -Continue monitoring  Acute on chronic COPD -Continue Trelegy Ellipta and nebulizers -On steroid for pericardial effusion  Left chest wall pain-TTP -Add Lidoderm patch.  Debility/physical deconditioning -PT/OT  Goal of care-significant cardiopulmonary comorbidities as above.  Prognosis is not great.  She is a still full code.  This would probably pose more harm than benefit. -Appreciate input by palliative care              DVT prophylaxis:    Code Status: Full code Family  Communication: Updated patient's husband at bedside Status is: Inpatient  Remains inpatient appropriate because:Hemodynamically unstable, Ongoing diagnostic testing  needed not appropriate for outpatient work up, IV treatments appropriate due to intensity of illness or inability to take PO and Inpatient level of care appropriate due to severity of illness   Dispo: The patient is from: Home              Anticipated d/c is to:  To be determined              Anticipated d/c date is: > 3 days              Patient currently is not medically stable to d/c.       Consultants:  Cardiology Advanced heart failure team Infectious disease Palliative care medicine   Sch Meds:  Scheduled Meds:  acetaminophen  650 mg Oral Q8H   acidophilus  1 capsule Oral Daily   ambrisentan  10 mg Oral Daily   amiodarone  200 mg Oral Daily   apixaban  5 mg Oral BID   aspirin EC  81 mg Oral Daily   colchicine  0.3 mg Oral Daily   fluticasone  2 spray Each Nare Daily   umeclidinium bromide  2 puff Inhalation Daily   And   fluticasone furoate-vilanterol  2 puff Inhalation Daily   furosemide  40 mg Oral Daily   guaiFENesin  600 mg Oral BID   lactulose  20 g Oral BID   methylPREDNISolone (SOLU-MEDROL) injection  60 mg Intravenous Q24H   nystatin  5 mL Oral QID   senna  1 tablet Oral Daily   tadalafil (PAH)  40 mg Oral Daily   Continuous Infusions:  PRN Meds:.ondansetron **OR** ondansetron (ZOFRAN) IV, traMADol  Antimicrobials: Anti-infectives (From admission, onward)   Start     Dose/Rate Route Frequency Ordered Stop   02/21/20 0600  vancomycin (VANCOCIN) IVPB 1000 mg/200 mL premix  Status:  Discontinued        1,000 mg 200 mL/hr over 60 Minutes Intravenous Every 24 hours 02/20/20 0621 02/23/20 1223   02/20/20 1000  cefTRIAXone (ROCEPHIN) 2 g in sodium chloride 0.9 % 100 mL IVPB  Status:  Discontinued        2 g 200 mL/hr over 30 Minutes Intravenous Every 24 hours 02/19/20 1342 02/20/20 0616   02/20/20 1000  azithromycin (ZITHROMAX) 500 mg in sodium chloride 0.9 % 250 mL IVPB  Status:  Discontinued        500 mg 250 mL/hr over 60 Minutes  Intravenous Every 24 hours 02/19/20 1342 02/20/20 1019   02/20/20 1000  cefTRIAXone (ROCEPHIN) 2 g in sodium chloride 0.9 % 100 mL IVPB  Status:  Discontinued        2 g 200 mL/hr over 30 Minutes Intravenous Every 24 hours 02/20/20 0913 02/21/20 0749   02/20/20 0645  vancomycin (VANCOREADY) IVPB 1250 mg/250 mL        1,250 mg 166.7 mL/hr over 90 Minutes Intravenous  Once 02/20/20 0621 02/21/20 1649   02/19/20 1015  cefTRIAXone (ROCEPHIN) 1 g in sodium chloride 0.9 % 100 mL IVPB        1 g 200 mL/hr over 30 Minutes Intravenous  Once 02/19/20 1008 02/19/20 1151   02/19/20 1015  azithromycin (ZITHROMAX) 500 mg in sodium chloride 0.9 % 250 mL IVPB        500 mg 250 mL/hr over 60 Minutes Intravenous  Once 02/19/20 1008 02/19/20 1324  I have personally reviewed the following labs and images: CBC: Recent Labs  Lab 02/19/20 0405 02/20/20 0240 02/21/20 0227 02/22/20 0259 02/23/20 0339  WBC 11.4* 17.9* 15.8* 11.2* 9.8  NEUTROABS 9.8*  --   --   --   --   HGB 12.0 11.0* 10.1* 9.7* 9.8*  HCT 38.5 35.6* 32.2* 30.9* 31.5*  MCV 101.0* 100.3* 100.0 99.4 101.0*  PLT 309 251 250 234 232   BMP &GFR Recent Labs  Lab 02/20/20 0240 02/21/20 0227 02/22/20 0259 02/23/20 0339 02/24/20 0433  NA 136 140 139 140 142  K 3.7 3.8 3.6 4.0 3.6  CL 87* 89* 90* 90* 91*  CO2 37* 38* 38* 40* 41*  GLUCOSE 136* 129* 129* 119* 108*  BUN 25* 43* 54* 58* 55*  CREATININE 1.45* 1.49* 1.39* 1.35* 1.32*  CALCIUM 8.3* 8.6* 8.5* 8.6* 8.5*  MG 2.1 2.5* 2.7* 3.2* 3.0*  PHOS  --  5.4* 4.8* 4.4 3.7   Estimated Creatinine Clearance: 35.5 mL/min (A) (by C-G formula based on SCr of 1.32 mg/dL (H)). Liver & Pancreas: Recent Labs  Lab 02/19/20 0405 02/21/20 0227 02/22/20 0259 02/23/20 0339 02/24/20 0433  AST 90*  --   --  26  --   ALT 34  --   --  47*  --   ALKPHOS 130*  --   --  92  --   BILITOT 1.9*  --   --  0.8  --   PROT 6.1*  --   --  5.8*  --   ALBUMIN 3.1* 2.9* 2.7* 2.7*   2.7* 2.6*   No  results for input(s): LIPASE, AMYLASE in the last 168 hours. Recent Labs  Lab 02/23/20 1040 02/24/20 0433  AMMONIA 52* 34   Diabetic: No results for input(s): HGBA1C in the last 72 hours. No results for input(s): GLUCAP in the last 168 hours. Cardiac Enzymes: No results for input(s): CKTOTAL, CKMB, CKMBINDEX, TROPONINI in the last 168 hours. No results for input(s): PROBNP in the last 8760 hours. Coagulation Profile: No results for input(s): INR, PROTIME in the last 168 hours. Thyroid Function Tests: No results for input(s): TSH, T4TOTAL, FREET4, T3FREE, THYROIDAB in the last 72 hours. Lipid Profile: No results for input(s): CHOL, HDL, LDLCALC, TRIG, CHOLHDL, LDLDIRECT in the last 72 hours. Anemia Panel: Recent Labs    02/23/20 1040  VITAMINB12 477  FOLATE 9.9  FERRITIN 426*  TIBC 259  IRON 42  RETICCTPCT 2.7   Urine analysis:    Component Value Date/Time   COLORURINE YELLOW 02/23/2020 1319   APPEARANCEUR HAZY (A) 02/23/2020 1319   LABSPEC 1.019 02/23/2020 1319   PHURINE 5.0 02/23/2020 1319   GLUCOSEU NEGATIVE 02/23/2020 1319   GLUCOSEU NEGATIVE 07/07/2017 1625   HGBUR NEGATIVE 02/23/2020 1319   HGBUR negative 12/22/2007 1139   BILIRUBINUR NEGATIVE 02/23/2020 1319   KETONESUR NEGATIVE 02/23/2020 1319   PROTEINUR NEGATIVE 02/23/2020 1319   UROBILINOGEN 0.2 07/07/2017 1625   NITRITE NEGATIVE 02/23/2020 1319   LEUKOCYTESUR NEGATIVE 02/23/2020 1319   Sepsis Labs: Invalid input(s): PROCALCITONIN, [place]  Microbiology: Recent Results (from the past 240 hour(s))  SARS Coronavirus 2 by RT PCR (hospital order, performed in [facility] hospital lab) Nasopharyngeal Nasopharyngeal Swab     Status: None   Collection Time: 02/19/20  5:27 AM   Specimen: Nasopharyngeal Swab  Result Value Ref Range Status   SARS Coronavirus 2 NEGATIVE NEGATIVE Final    Comment: (NOTE) SARS-CoV-2 target nucleic acids are NOT DETECTED.  The SARS-CoV-2  RNA is generally detectable in  upper and lower respiratory specimens during the acute phase of infection. The lowest concentration of SARS-CoV-2 viral copies this assay can detect is 250 copies / mL. A negative result does not preclude SARS-CoV-2 infection and should not be used as the sole basis for treatment or other patient management decisions.  A negative result may occur with improper specimen collection / handling, submission of specimen other than nasopharyngeal swab, presence of viral mutation(s) within the areas targeted by this assay, and inadequate number of viral copies (<250 copies / mL). A negative result must be combined with clinical observations, patient history, and epidemiological information.  Fact Sheet for Patients:   StrictlyIdeas.no  Fact Sheet for Healthcare Providers: BankingDealers.co.za  This test is not yet approved or  cleared by the [place] FDA and has been authorized for detection and/or diagnosis of SARS-CoV-2 by FDA under an Emergency Use Authorization (EUA).  This EUA will remain in effect (meaning this test can be used) for the duration of the COVID-19 declaration under Section 564(b)(1) of the Act, 21 U.S.C. section 360bbb-3(b)(1), unless the authorization is terminated or revoked sooner.  Performed at [facility], [place] [address], [place], [place] [address]   Culture, blood (routine x 2)     Status: Abnormal   Collection Time: 02/19/20 10:36 AM   Specimen: BLOOD RIGHT HAND  Result Value Ref Range Status   Specimen Description BLOOD RIGHT HAND  Final   Special Requests   Final    BOTTLES DRAWN AEROBIC AND ANAEROBIC Blood Culture adequate volume   Culture  Setup Time   Final    GRAM POSITIVE COCCI IN CLUSTERS IN BOTH AEROBIC AND ANAEROBIC BOTTLES CRITICAL VALUE NOTED.  VALUE IS CONSISTENT WITH PREVIOUSLY REPORTED AND CALLED VALUE. Performed at [facility], [place] [address] [place], [place] [address]     Culture (A)  Final    STAPHYLOCOCCUS EPIDERMIDIS STAPHYLOCOCCUS HOMINIS    Report Status 02/23/2020 FINAL  Final   Organism ID, Bacteria STAPHYLOCOCCUS EPIDERMIDIS  Final   Organism ID, Bacteria STAPHYLOCOCCUS HOMINIS  Final      Susceptibility   Staphylococcus epidermidis - MIC*    CIPROFLOXACIN 4 RESISTANT Resistant     ERYTHROMYCIN >=8 RESISTANT Resistant     GENTAMICIN <=0.5 SENSITIVE Sensitive     OXACILLIN >=4 RESISTANT Resistant     TETRACYCLINE >=16 RESISTANT Resistant     VANCOMYCIN 1 SENSITIVE Sensitive     TRIMETH/SULFA 160 RESISTANT Resistant     CLINDAMYCIN RESISTANT Resistant     RIFAMPIN <=0.5 SENSITIVE Sensitive     Inducible Clindamycin POSITIVE Resistant     * STAPHYLOCOCCUS EPIDERMIDIS   Staphylococcus hominis - MIC*    CIPROFLOXACIN <=0.5 SENSITIVE Sensitive     ERYTHROMYCIN <=0.25 SENSITIVE Sensitive     GENTAMICIN <=0.5 SENSITIVE Sensitive     OXACILLIN <=0.25 SENSITIVE Sensitive     TETRACYCLINE <=1 SENSITIVE Sensitive     VANCOMYCIN <=0.5 SENSITIVE Sensitive     TRIMETH/SULFA <=10 SENSITIVE Sensitive     CLINDAMYCIN <=0.25 SENSITIVE Sensitive     RIFAMPIN <=0.5 SENSITIVE Sensitive     Inducible Clindamycin NEGATIVE Sensitive     * STAPHYLOCOCCUS HOMINIS  Culture, blood (routine x 2)     Status: Abnormal   Collection Time: 02/19/20 10:48 AM   Specimen: BLOOD  Result Value Ref Range Status   Specimen Description BLOOD RIGHT ANTECUBITAL  Final   Special Requests   Final    BOTTLES  DRAWN AEROBIC AND ANAEROBIC Blood Culture adequate volume   Culture  Setup Time   Final    GRAM POSITIVE COCCI IN CLUSTERS IN BOTH AEROBIC AND ANAEROBIC BOTTLES CRITICAL RESULT CALLED TO, READ BACK BY AND VERIFIED WITH: PHRMD V BRYK _0  02/20/20 BY S GEZAHEGN Performed at [facility], [place] [address], [place], [place] [address]    Culture (A)  Final    STAPHYLOCOCCUS EPIDERMIDIS STAPHYLOCOCCUS HOMINIS    Report Status 02/23/2020 FINAL  Final   Organism ID,  Bacteria STAPHYLOCOCCUS EPIDERMIDIS  Final   Organism ID, Bacteria STAPHYLOCOCCUS HOMINIS  Final      Susceptibility   Staphylococcus epidermidis - MIC*    CIPROFLOXACIN >=8 RESISTANT Resistant     ERYTHROMYCIN >=8 RESISTANT Resistant     GENTAMICIN <=0.5 SENSITIVE Sensitive     OXACILLIN >=4 RESISTANT Resistant     TETRACYCLINE <=1 SENSITIVE Sensitive     VANCOMYCIN 1 SENSITIVE Sensitive     TRIMETH/SULFA 160 RESISTANT Resistant     CLINDAMYCIN RESISTANT Resistant     RIFAMPIN <=0.5 SENSITIVE Sensitive     Inducible Clindamycin POSITIVE Resistant     * STAPHYLOCOCCUS EPIDERMIDIS   Staphylococcus hominis - MIC*    CIPROFLOXACIN 4 RESISTANT Resistant     ERYTHROMYCIN >=8 RESISTANT Resistant     GENTAMICIN <=0.5 SENSITIVE Sensitive     OXACILLIN >=4 RESISTANT Resistant     TETRACYCLINE <=1 SENSITIVE Sensitive     VANCOMYCIN 1 SENSITIVE Sensitive     TRIMETH/SULFA 160 RESISTANT Resistant     CLINDAMYCIN RESISTANT Resistant     RIFAMPIN <=0.5 SENSITIVE Sensitive     Inducible Clindamycin POSITIVE Resistant     * STAPHYLOCOCCUS HOMINIS  Blood Culture ID Panel (Reflexed)     Status: Abnormal   Collection Time: 02/19/20 10:48 AM  Result Value Ref Range Status   Enterococcus species NOT DETECTED NOT DETECTED Final   Listeria monocytogenes NOT DETECTED NOT DETECTED Final   Staphylococcus species DETECTED (A) NOT DETECTED Final    Comment: Methicillin (oxacillin) resistant coagulase negative staphylococcus. Possible blood culture contaminant (unless isolated from more than one blood culture draw or clinical case suggests pathogenicity). No antibiotic treatment is indicated for blood  culture contaminants. CRITICAL RESULT CALLED TO, READ BACK BY AND VERIFIED WITH: PHRMD V BRYK _1  02/20/20 BY S GEZAHEGN    Staphylococcus aureus (BCID) NOT DETECTED NOT DETECTED Final   Methicillin resistance DETECTED (A) NOT DETECTED Final    Comment: CRITICAL RESULT CALLED TO, READ BACK BY AND VERIFIED  WITH: PHRMD V BRYK _2  02/20/20 BY S GEZAHEGN    Streptococcus species NOT DETECTED NOT DETECTED Final   Streptococcus agalactiae NOT DETECTED NOT DETECTED Final   Streptococcus pneumoniae NOT DETECTED NOT DETECTED Final   Streptococcus pyogenes NOT DETECTED NOT DETECTED Final   Acinetobacter baumannii NOT DETECTED NOT DETECTED Final   Enterobacteriaceae species NOT DETECTED NOT DETECTED Final   Enterobacter cloacae complex NOT DETECTED NOT DETECTED Final   Escherichia coli NOT DETECTED NOT DETECTED Final   Klebsiella oxytoca NOT DETECTED NOT DETECTED Final   Klebsiella pneumoniae NOT DETECTED NOT DETECTED Final   Proteus species NOT DETECTED NOT DETECTED Final   Serratia marcescens NOT DETECTED NOT DETECTED Final   Haemophilus influenzae NOT DETECTED NOT DETECTED Final   Neisseria meningitidis NOT DETECTED NOT DETECTED Final   Pseudomonas aeruginosa NOT DETECTED NOT DETECTED Final   Candida albicans NOT DETECTED NOT DETECTED Final   Candida glabrata NOT DETECTED NOT DETECTED Final  Candida krusei NOT DETECTED NOT DETECTED Final   Candida parapsilosis NOT DETECTED NOT DETECTED Final   Candida tropicalis NOT DETECTED NOT DETECTED Final    Comment: Performed at [facility], [place] [address] [place], [place] [address]  MRSA PCR Screening     Status: None   Collection Time: 02/19/20  8:59 PM   Specimen: Nasopharyngeal  Result Value Ref Range Status   MRSA by PCR NEGATIVE NEGATIVE Final    Comment:        The GeneXpert MRSA Assay (FDA approved for NASAL specimens only), is one component of a comprehensive MRSA colonization surveillance program. It is not intended to diagnose MRSA infection nor to guide or monitor treatment for MRSA infections. Performed at [facility], [place] [address], [place], [place] [address]   Culture, blood (Routine X 2) w Reflex to ID Panel     Status: None (Preliminary result)   Collection Time: 02/20/20 10:43 AM   Specimen: BLOOD LEFT ARM    Result Value Ref Range Status   Specimen Description BLOOD LEFT ARM  Final   Special Requests   Final    BOTTLES DRAWN AEROBIC ONLY Blood Culture adequate volume   Culture   Final    NO GROWTH 4 DAYS Performed at [facility], [address], [place], [place] [address]    Report Status PENDING  Incomplete  Culture, blood (Routine X 2) w Reflex to ID Panel     Status: None (Preliminary result)   Collection Time: 02/20/20 10:48 AM   Specimen: BLOOD LEFT WRIST  Result Value Ref Range Status   Specimen Description BLOOD LEFT WRIST  Final   Special Requests   Final    BOTTLES DRAWN AEROBIC ONLY Blood Culture results may not be optimal due to an inadequate volume of blood received in culture bottles   Culture   Final    NO GROWTH 4 DAYS Performed at [facility], [place] [address], [place], [place] [address]    Report Status PENDING  Incomplete    Radiology Studies: ECHOCARDIOGRAM LIMITED  Result Date: 02/24/2020    ECHOCARDIOGRAM LIMITED REPORT   Patient Name:   DAYAN KREIS Date of Exam: 02/24/2020 Medical Rec #:  [id number]           Height:       63.0 in Accession #:    [id number]          Weight:       143.5 lb Date of Birth:  07-13-1946           BSA:          1.679 m Patient Age:    68 years            BP:           106/48 mmHg Patient Gender: F                   HR:           63 bpm. Exam Location:  Inpatient Procedure: Limited Echo, Limited Color Doppler and Cardiac Doppler Indications:    Pericardial Effusion I31.3  History:        Patient has prior history of Echocardiogram examinations, most                 recent 02/21/2020. CHF, COPD and Pulmonary HTN; Risk  Factors:Hypertension.  Sonographer:    Mikki Santee RDCS (AE) Referring Phys: [place]  1. Left ventricular ejection fraction, by estimation, is 65 to 70%. The left ventricle has normal function. The left ventricle has no regional wall motion abnormalities. Left  ventricular diastolic function could not be evaluated.  2. Right ventricular systolic function is normal. The right ventricular size is normal. There is moderately elevated pulmonary artery systolic pressure. The estimated right ventricular systolic pressure is 59.1 mmHg.  3. Small circumferential pericardial effusion. There is no evidence of cardiac tamponade.     The epicardial layer appears diffusely thickened, consistent with inflammatory pericardial disease.  4. The mitral valve is normal in structure.  5. The aortic valve is normal in structure.  6. The inferior vena cava is dilated in size with <50% respiratory variability, suggesting right atrial pressure of 15 mmHg. Comparison(s): Prior images reviewed side by side. There is substantial reduction in the size of the pericardial effusion. FINDINGS  Left Ventricle: Left ventricular ejection fraction, by estimation, is 65 to 70%. The left ventricle has normal function. The left ventricle has no regional wall motion abnormalities. There is no left ventricular hypertrophy. Right Ventricle: The right ventricular size is normal. No increase in right ventricular wall thickness. Right ventricular systolic function is normal. There is moderately elevated pulmonary artery systolic pressure. The tricuspid regurgitant velocity is 2.99 m/s, and with an assumed right atrial pressure of 15 mmHg, the estimated right ventricular systolic pressure is 63.8 mmHg. Left Atrium: Left atrial size was normal in size. Right Atrium: Right atrial size was normal in size. Pericardium: The epicardial layer appears diffusely thickened, consistent with inflammatory pericardial disease. A small pericardial effusion is present. The pericardial effusion is circumferential. There is no evidence of cardiac tamponade. Mitral Valve: The mitral valve is normal in structure. Tricuspid Valve: The tricuspid valve is normal in structure. Aortic Valve: The aortic valve is normal in structure. Pulmonic  Valve: The pulmonic valve was not well visualized. Venous: The inferior vena cava is dilated in size with less than 50% respiratory variability, suggesting right atrial pressure of 15 mmHg.  TRICUSPID VALVE TR Peak grad:   35.8 mmHg TR Vmax:        299.00 cm/s Sanda Klein MD Electronically signed by Sanda Klein MD Signature Date/Time: 02/24/2020/11:59:12 AM    Final      [place] Warzecha T. Vanlue  If 7PM-7AM, please contact night-coverage [email or web address] Password [facility] 02/24/2020, 2:29 PM

## 2020-02-24 NOTE — Progress Notes (Addendum)
Advanced Heart Failure Rounding Note  PCP-Cardiologist: [name], MD   Subjective:    Repeat Echo 6/15 showed pericardial effusion to be a bit smaller but still large. Hemodynamically stable.   Remains on amio drip 30 mg. Maintaining NSR.   Complaining of weakness.     Objective:   Weight Range: 65.1 kg Body mass index is 25.42 kg/m.   Vital Signs:   Temp:  [97.7 F (36.5 C)-98.7 F (37.1 C)] 97.7 F (36.5 C) (06/18 0312) Pulse Rate:  [62-71] 62 (06/18 0312) Resp:  [14-17] 16 (06/18 0312) BP: (96-117)/(40-51) 105/51 (06/18 0312) SpO2:  [87 %-96 %] 87 % (06/18 0851) Weight:  [65.1 kg] 65.1 kg (06/18 0312) Last BM Date: 02/23/20  Weight change: Filed Weights   02/22/20 1220 02/23/20 0300 02/24/20 0312  Weight: 62.1 kg 65.6 kg 65.1 kg    Intake/Output:   Intake/Output Summary (Last 24 hours) at 02/24/2020 1014 Last data filed at 02/23/2020 1700 Gross per 24 hour  Intake --  Output 100 ml  Net -100 ml      Physical Exam    General:  Appears chronically ill. No resp difficulty HEENT: normal Neck: supple. JPV 9-10- . Carotids 2+ bilat; no bruits. No lymphadenopathy or thryomegaly appreciated. Cor: PMI nondisplaced. Regular rate & rhythm. No rubs, gallops or murmurs. Lungs: clear Abdomen: soft, nontender, nondistended. No hepatosplenomegaly. No bruits or masses. Good bowel sounds. Extremities: no cyanosis, clubbing, rash, edema Neuro: alert & orientedx3, cranial nerves grossly intact. moves all 4 extremities w/o difficulty. Affect flat    Telemetry  NSR 60s personally reviewed.   EKG    No new EKG to review today   Labs    CBC Recent Labs    02/22/20 0259 02/23/20 0339  WBC 11.2* 9.8  HGB 9.7* 9.8*  HCT 30.9* 31.5*  MCV 99.4 101.0*  PLT 234 272   Basic Metabolic Panel Recent Labs    02/23/20 0339 02/24/20 0433  NA 140 142  K 4.0 3.6  CL 90* 91*  CO2 40* 41*  GLUCOSE 119* 108*  BUN 58* 55*  CREATININE 1.35* 1.32*  CALCIUM  8.6* 8.5*  MG 3.2* 3.0*  PHOS 4.4 3.7   Liver Function Tests Recent Labs    02/23/20 0339 02/24/20 0433  AST 26  --   ALT 47*  --   ALKPHOS 92  --   BILITOT 0.8  --   PROT 5.8*  --   ALBUMIN 2.7*  2.7* 2.6*   No results for input(s): LIPASE, AMYLASE in the last 72 hours. Cardiac Enzymes No results for input(s): CKTOTAL, CKMB, CKMBINDEX, TROPONINI in the last 72 hours.  BNP: BNP (last 3 results) Recent Labs    05/12/19 1224 12/09/19 1239 02/19/20 0405  BNP 272.8* 324.2* 164.0*    ProBNP (last 3 results) No results for input(s): PROBNP in the last 8760 hours.   D-Dimer No results for input(s): DDIMER in the last 72 hours. Hemoglobin A1C No results for input(s): HGBA1C in the last 72 hours. Fasting Lipid Panel No results for input(s): CHOL, HDL, LDLCALC, TRIG, CHOLHDL, LDLDIRECT in the last 72 hours. Thyroid Function Tests No results for input(s): TSH, T4TOTAL, T3FREE, THYROIDAB in the last 72 hours.  Invalid input(s): FREET3  Other results:   Imaging    No results found.   Medications:     Scheduled Medications: . acetaminophen  650 mg Oral Q8H  . acidophilus  1 capsule Oral Daily  . ambrisentan  10 mg  Oral Daily  . aspirin EC  81 mg Oral Daily  . colchicine  0.3 mg Oral Daily  . enoxaparin (LOVENOX) injection  40 mg Subcutaneous Q24H  . fluticasone  2 spray Each Nare Daily  . umeclidinium bromide  2 puff Inhalation Daily   And  . fluticasone furoate-vilanterol  2 puff Inhalation Daily  . guaiFENesin  600 mg Oral BID  . lactulose  20 g Oral BID  . methylPREDNISolone (SOLU-MEDROL) injection  60 mg Intravenous Q24H  . senna  1 tablet Oral Daily  . tadalafil (PAH)  40 mg Oral Daily    Infusions: . amiodarone 30 mg/hr (02/23/20 2309)    PRN Medications: ondansetron **OR** ondansetron (ZOFRAN) IV, traMADol   Assessment/Plan   1.  A/C Hypoxic Respiratory Failure/ possible pneumonia / AECOPD?  Oxygen requirement on admit. O2 increased to 6  liters. Now back down on 3 liters.  Sats stable.  - Lactic Acid 3.4>2.2>2.1  - WBC 17.9>15.8 >11.2>9.8> no cbc - Sed Rate 63.  - Initial Blood CX + with different susceptibilities per ID represent contaminants.  - Blood CX repeated 6/14. NGTD - Rocephin stopped 6/14.  - Vacn stopped 02/23/20 - ID signed off.   2. Pericardial Effusion  - Large pericardial effusion on ECHO and CTA.  - Repeat ECHO  6/15: Pericardial effusion seems a bit smaller but still large.  - hemodynamically stable currently. No indication for pericardiocentesis or window currently.  - ECHO today with smaller pericardial effusion.  - Rheum serologies remain surprisingly negative. Need to consider other etiologies for effusion and pleuritic CP (? Infectious/viral). Possible inflammatory process. ESR elevated at 63. Continue colchicine    3. Pulmonary HTN - On sildenafil + letaris , continue - On ECHO PA pressure 87 mm hg    4. Chronic Diastolic HF  - ECHO this admit preserved LVEF , Grade IDD -  Volume overloaded. CO2 41. Add lasix 40 mg po daily. Prior to admit she was taking lasix 40 mg po twice a day.  - Considered diamox but not felt to be beneficial with chronic lung issues and elevated PCO2 chronically.   5. PAF -Started on amio drip 6/14- converted to NSR.  -Reverted back to Afib w/ RVR 6/15. Converted back to NSR w/ amio bolus - Stop IV amio. Switch to po amiodarone.  - Can start elquis. Discussed with pharmacy. Pericardial effusion getting smaller.    6. AKI  Creatinine baseline ~1.  Todays creatinine 1.3 - continue to follow BMP   7. [place] today.   Consider pulmonary input.   Length of Stay: 5  Amy Clegg, NP  02/24/2020, 10:14 AM  Advanced Heart Failure Team Pager [phone] (M-F; [place])  Please contact [facility] for night-coverage after hours (4p -7a ) and weekends on [email or web address]  Patient seen and examined with the above-signed Advanced Practice Provider and/or Housestaff. I  personally reviewed laboratory data, imaging studies and relevant notes. I independently examined the patient and formulated the important aspects of the plan. I have edited the note to reflect any of my changes or salient points. I have personally discussed the plan with the patient and/or family.  Says she feels weak. Chest hurts. ABG yesterday with CO2 retention   General:  Weak appearing. No resp difficulty HEENT: normal +[place] Neck: supple. no JVD. Carotids 2+ bilat; no bruits. No lymphadenopathy or thryomegaly appreciated. Cor: PMI nondisplaced. Regular rate & rhythm. No rubs, gallops or murmurs. Lungs: decreased throughout  Abdomen:  soft, nontender, nondistended. No hepatosplenomegaly. No bruits or masses. Good bowel sounds. Extremities: no cyanosis, clubbing, rash, edema Neuro: alert & orientedx3, cranial nerves grossly intact. moves all 4 extremities w/o difficulty. Affect pleasant  No further AF. Echo reviewed personally and pericardial effusion much smaller. My main suspicion was connective tissue disease but workup again is normal. ? Viral pericardial effusion.   Recs: 1. Change steroids to prednisone 40 daily taper over 3 weeks 2. Colchicine 0.6 bid for 3 months 3. Switch amio to 200 daily 4. Start Eliquis  Can likely go home soon from our standpoint. Given CO2 retention and underlying lung disease can consider Pulmonary input.   We will follow at a distance.   [name], MD  5:24 PM

## 2020-02-24 NOTE — Care Management (Signed)
02-24-20 1447 Case Manager received consult regarding Apixaban 63m BID. Benefits check submitted and Case Manager will follow for cost. Graves-Bigelow, BOcie Cornfield RN,BSN Case Manager

## 2020-02-24 NOTE — Progress Notes (Signed)
Physical Therapy Treatment Patient Details Name: Linda Stevens MRN: [id number] DOB: 04/11/1946 Today's Date: 02/24/2020    History of Present Illness Pt adm with acute hypoxic respiratory failure and large pericardial effusion and PNA. PMH - diastolic heart failure, pulmonary htn, copd, htn    PT Comments    Pt supine in bed on entry with husband in room. PT/OT co-treatment due pt increasing anxiety and self limiting behavior in presence of increasing generalized weakness. Pt requires increased motivation and encouragement with all mobility. RN also in room providing encouragement. Pt also limited by increased O2 demand see (General Comments) Pt is min Ax2 for coming to EoB and modAx2 for sit<>stand in [place]. With standing in [place] pt becomes incontinent of stool and is very distraught that she requires so much assist. Using St[place] pt transferred to [facility] where PT/OT cleaned her up changing soiled socks. Pt then transferred to standing with modAx2 and moved to recliner to sit up. Once pt in recliner, she has decreased anxiety and is happy with her progress. D/c plans remain appropriate at this time. PT will continue to follow acutely.    Follow Up Recommendations  Home health PT     Equipment Recommendations  Other (comment)       Precautions / Restrictions Precautions Precautions: Other (comment) Precaution Comments: watch SpO2 Restrictions Weight Bearing Restrictions: No    Mobility  Bed Mobility Overal bed mobility: Needs Assistance Bed Mobility: Supine to Sit     Supine to sit: Min assist;+2 for safety/equipment     General bed mobility comments: Min A to EOB required increased cues and extra time, as well as increased reassurance  Transfers Overall transfer level: Needs assistance   Transfers: Sit to/from Stand Sit to Stand: Mod assist;+2 safety/equipment         General transfer comment: Completed sit<>stands in session x4, noted to require increased assist the  longer pt sat on BSC (to be expected) requiring max verbal cues and reassurance with each attempt        Balance Overall balance assessment: Needs assistance Sitting-balance support: No upper extremity supported;Feet supported Sitting balance-Leahy Scale: Good Sitting balance - Comments: Once positioned, pt is able to maintain balance, but anxiety is limiting factor   Standing balance support: Bilateral upper extremity supported Standing balance-Leahy Scale: Poor Standing balance comment: Able to statically stand in stedy for peri-care, too anxious to complete dynamic balance                            Cognition Arousal/Alertness: Awake/alert Behavior During Therapy: Anxious Overall Cognitive Status: Within Functional Limits for tasks assessed                                 General Comments: pt very anxious during session, with increased aprhension about moving requiring max encouragement and reassurance throughout entirety of session often asking "did I do good?"         General Comments General comments (skin integrity, edema, etc.): Pt on 3L O2 via [place] on entry with SaO2 90%O2 with moving to EoB SaO2 dropped to 80%O2 RN in room requesting wait to see if O2 rebounds, after 3 minutes climbs back to 83%O2, increased to 5L O2 and SaO2 increases to 85%O2 after an additonal 3 min, RN request increase to 9L O2 and SaO2 increased to 92%O2, while pt on bedside commode able to  titrate back to 5L O2 while maintaining 89%O2      Pertinent Vitals/Pain Pain Assessment: Faces Faces Pain Scale: Hurts a little bit Pain Location: Generalized Pain Descriptors / Indicators: Grimacing Pain Intervention(s): Limited activity within patient's tolerance;Monitored during session;Repositioned           PT Goals (current goals can now be found in the care plan section) Acute Rehab PT Goals Patient Stated Goal: return home PT Goal Formulation: With patient Time For Goal  Achievement: 03/07/20 Potential to Achieve Goals: Good Progress towards PT goals: Progressing toward goals    Frequency    Min 3X/week      PT Plan Current plan remains appropriate    Co-evaluation PT/OT/SLP Co-Evaluation/Treatment: Yes Reason for Co-Treatment: Necessary to address cognition/behavior during functional activity;Complexity of the patient's impairments (multi-system involvement) PT goals addressed during session: Mobility/safety with mobility OT goals addressed during session: ADL's and self-care;Strengthening/ROM      AM-PAC PT "6 Clicks" Mobility   Outcome Measure  Help needed turning from your back to your side while in a flat bed without using bedrails?: None Help needed moving from lying on your back to sitting on the side of a flat bed without using bedrails?: None Help needed moving to and from a bed to a chair (including a wheelchair)?: A Little Help needed standing up from a chair using your arms (e.g., wheelchair or bedside chair)?: A Little Help needed to walk in hospital room?: A Little Help needed climbing 3-5 steps with a railing? : A Lot 6 Click Score: 19    End of Session Equipment Utilized During Treatment: Gait belt;Oxygen Activity Tolerance: Patient limited by fatigue;Other (comment) (dyspnea, and decreased O2 saturation ) Patient left: in chair;with call bell/phone within reach;with chair alarm set Nurse Communication: Mobility status;Other (comment) (request for flutter valve) PT Visit Diagnosis: Other abnormalities of gait and mobility (R26.89);Muscle weakness (generalized) (M62.81)     Time: 2712-9290 PT Time Calculation (min) (ACUTE ONLY): 51 min  Charges:  $Therapeutic Activity: 8-22 mins                     Odeal Welden B. Migdalia Dk PT, DPT Acute Rehabilitation Services Pager [phone] Office [phone]    [place] 02/24/2020, 4:46 PM

## 2020-02-25 LAB — CBC WITH DIFFERENTIAL/PLATELET
Abs Immature Granulocytes: 0.15 10*3/uL — ABNORMAL HIGH (ref 0.00–0.07)
Basophils Absolute: 0 10*3/uL (ref 0.0–0.1)
Basophils Relative: 0 %
Eosinophils Absolute: 0 10*3/uL (ref 0.0–0.5)
Eosinophils Relative: 0 %
HCT: 30.2 % — ABNORMAL LOW (ref 36.0–46.0)
Hemoglobin: 9.2 g/dL — ABNORMAL LOW (ref 12.0–15.0)
Immature Granulocytes: 2 %
Lymphocytes Relative: 5 %
Lymphs Abs: 0.5 10*3/uL — ABNORMAL LOW (ref 0.7–4.0)
MCH: 30.7 pg (ref 26.0–34.0)
MCHC: 30.5 g/dL (ref 30.0–36.0)
MCV: 100.7 fL — ABNORMAL HIGH (ref 80.0–100.0)
Monocytes Absolute: 0.8 10*3/uL (ref 0.1–1.0)
Monocytes Relative: 8 %
Neutro Abs: 8.6 10*3/uL — ABNORMAL HIGH (ref 1.7–7.7)
Neutrophils Relative %: 85 %
Platelets: 193 10*3/uL (ref 150–400)
RBC: 3 MIL/uL — ABNORMAL LOW (ref 3.87–5.11)
RDW: 15.9 % — ABNORMAL HIGH (ref 11.5–15.5)
WBC: 10 10*3/uL (ref 4.0–10.5)
nRBC: 0.2 % (ref 0.0–0.2)

## 2020-02-25 LAB — RENAL FUNCTION PANEL
Albumin: 2.6 g/dL — ABNORMAL LOW (ref 3.5–5.0)
Anion gap: 9 (ref 5–15)
BUN: 42 mg/dL — ABNORMAL HIGH (ref 8–23)
CO2: 43 mmol/L — ABNORMAL HIGH (ref 22–32)
Calcium: 8.3 mg/dL — ABNORMAL LOW (ref 8.9–10.3)
Chloride: 93 mmol/L — ABNORMAL LOW (ref 98–111)
Creatinine, Ser: 1.19 mg/dL — ABNORMAL HIGH (ref 0.44–1.00)
GFR calc Af Amer: 53 mL/min — ABNORMAL LOW (ref >60)
GFR calc non Af Amer: 46 mL/min — ABNORMAL LOW (ref >60)
Glucose, Bld: 102 mg/dL — ABNORMAL HIGH (ref 70–99)
Phosphorus: 2.5 mg/dL (ref 2.5–4.6)
Potassium: 3.5 mmol/L (ref 3.5–5.1)
Sodium: 145 mmol/L (ref 135–145)

## 2020-02-25 LAB — CULTURE, BLOOD (ROUTINE X 2)
Culture: NO GROWTH
Culture: NO GROWTH
Special Requests: ADEQUATE

## 2020-02-25 LAB — MAGNESIUM: Magnesium: 2.9 mg/dL — ABNORMAL HIGH (ref 1.7–2.4)

## 2020-02-25 MED ORDER — PREDNISONE 20 MG PO TABS
40.0000 mg | ORAL_TABLET | Freq: Every day | ORAL | Status: AC
  Administered 2020-02-25 – 2020-02-26 (×2): 40 mg via ORAL
  Filled 2020-02-25 (×2): qty 2

## 2020-02-25 MED ORDER — POTASSIUM CHLORIDE CRYS ER 20 MEQ PO TBCR
40.0000 meq | EXTENDED_RELEASE_TABLET | Freq: Once | ORAL | Status: AC
  Administered 2020-02-25: 40 meq via ORAL
  Filled 2020-02-25: qty 2

## 2020-02-25 MED ORDER — PANTOPRAZOLE SODIUM 40 MG PO TBEC
40.0000 mg | DELAYED_RELEASE_TABLET | Freq: Every day | ORAL | Status: DC
  Administered 2020-02-25 – 2020-03-10 (×15): 40 mg via ORAL
  Filled 2020-02-25 (×15): qty 1

## 2020-02-25 NOTE — Progress Notes (Signed)
PROGRESS NOTE  Linda Stevens MGN:[id number] DOB: [birth year]   PCP: [name], MD  Patient is from: Home.  DOA: 02/19/2020 LOS: 6  Brief Narrative / Interim history: 74 year old female with history of COPD/chronic RF on 3 L, diastolic CHF, pulmonary HTN, HTN and HLD presenting with progressive shortness of breath for 1 week.  Tried with a course of Z-Pak and a steroid taper by her pulmonologist on 6/9 but continues to have progressive shortness of breath.  In ED, slightly hypotensive.  Required 6 L by [place].  WBC 11.4.  AST 90.  Total bili 1.9.  Lactic acid 2.8.  BNP 164.  Trop negative x2.  CXR concerning for cardiomegaly with small pleural effusion and by basilar atelectasis.  CTA chest showed sizable pericardial effusion, bilateral pleural effusions and consolidation concerning for pneumonia and pulmonary HTN.  Cultures drawn.  Received IV Solu-Medrol, morphine, Rocephin and azithromycin,and admitted for acute on chronic respiratory failure due to pneumonia, pleural effusions and pericardial effusion.  Cardiology consulted for pericardial effusion.  Echo obtained and revealed dilated and fixed IVC although no other features of tamponade.  The next day, blood cultures with GPC's in clusters in 4/4.  BICD with coag negative MR staph.  Vancomycin added.  ID consulted.  Azithromycin and ceftriaxone discontinued.  Repeat blood culture obtained and NGTD. Eventually, vancomycin discontinued as susceptibilities on initial blood culture were different suggesting contamination versus true bacteremia.  Patient went into A. fib with RVR with hypotension on 6/14.  Started on amiodarone drip and converted to sinus rhythm.  Advanced heart failure team and palliative medicine involved as well.  Subjective: Seen and examined earlier this morning.  Husband at bedside.  No major events overnight.  She says her breathing is about the same.  Still with left-sided chest wall pain.  Still feels tired.  Soft  blood pressures.  Denies nausea or vomiting.  Objective: Vitals:   02/24/20 2213 02/25/20 0357 02/25/20 0814 02/25/20 1330  BP: (!) 96/48 (!) 93/46  (!) 113/40  Pulse: 64   65  Resp: _0 Temp:  97.8 F (36.6 C)  (!) 97.5 F (36.4 C)  TempSrc:  Oral  Oral  SpO2: 97% 99% 98% 94%  Weight:  65.1 kg    Height:       No intake or output data in the 24 hours ending 02/25/20 1425 Filed Weights   02/23/20 0300 02/24/20 0312 02/25/20 0357  Weight: 65.6 kg 65.1 kg 65.1 kg    Examination:.  GENERAL: No apparent distress.  Nontoxic. HEENT: MMM.  Vision and hearing grossly intact.  NECK: Supple.  Prominent JVD. RESP: 95% on no IWOB.  Rhonchi and bibasilar crackles. CVS:  RRR. Heart sounds normal.  ABD/GI/GU: BS+. Abd soft, NTND.  MSK/EXT:  Moves extremities.  No edema.  TTP over left chest. SKIN: no apparent skin lesion or wound NEURO: Awake, alert and oriented fairly.  No apparent focal neuro deficit. PSYCH: Calm. Normal affect.   GENERAL: Frail and ill-appearing.  Nontoxic. HEENT: MMM.  Vision and hearing grossly intact.  NECK: Supple.  Notable JVD but improved. RESP: 99% on 5 L.  No IWOB.  Rhonchi and bibasilar crackles. CVS:  RRR. Heart sounds normal.  ABD/GI/GU: BS+. Abd soft, NTND.  MSK/EXT:  Moves extremities. No apparent deformity. No edema.  SKIN: no apparent skin lesion or wound NEURO: Awake, alert and oriented fairly.  No apparent focal neuro deficit. PSYCH: Calm. Normal affect.  Procedures:  None  Microbiology summarized: COVID-19 PCR negative. MRSA PCR negative. Blood cultures with GPC's in clusters in 4/4. BICD with coag negative methicillin-resistant staph with different susceptibilities deemed to be contaminant.  Assessment & Plan: Acute metabolic encephalopathy: Oriented to self, place, person and month.  Slow to respond to questions.  She has hypercarbia to 79 but well compensated.  Ammonia slightly elevated to 52>34.  TSH and B12 within normal.   Morphine, tramadol and Phenergan could contribute. -Lactulose discontinued after improvement in her ammonia -Order scheduled Tylenol.  Weaned off opiate and promethazine -CT head and intermittent BiPAP if no improvement -Reorientation and delirium precautions.  Abnormal blood culture-deemed to be contaminant.  Repeat blood culture on 6/14 NGTD.  TTE without vegetation. Leukocytosis resolved. -Appreciate ID guidance-recommended discontinue vancomycin and signed off. -Continue vancomycin 6/14> 6/17 -Ceftriaxone and azithromycin discontinued.   Community-acquired pneumonia? CTA concerning for bibasilar consolidation with pleural effusion but difficult diagnosis given other confounding factors. -Received vancomycin, ceftriaxone and azithromycin as above. -Off antibiotics now per ID recommendation -Incentive spirometry and flutter valve  Acute on chronic respiratory failure with hypoxia: Multifactorial including pericardiac and pleural effusion, pulmonary hypertension, underlying COPD, possible pneumonia and A. fib with RVR.  On 3 L at baseline. ABG suggestive for well compensated chronic respiratory acidosis. -Wean oxygen as able-minimum oxygen to keep saturation above 90%! -Treat potential etiologies  Pericardial effusion: Improved.  No tamponade. Repeat echo with minimal effusion but possible inflammatory pericarditis.  ESR 63.  Rheumatologic exam negative so far. -Cardiology managing-on colchicine and steroid.  Pleural effusion: Looks a small on review of CTA chest -Continue antibiotics as above  Pulmonary hypertension:  PAPP 87 mmHg.  RVSP 50.8 on limited echo on 6/18. -Advanced heart failure team following. -On sildenafil and ambrisentan  A. fib with RVR: Noted to have A. fib on EKG obtained by EMS.  Went into RVR on 6/14 and 6/15.  Converted to sinus rhythm with amiodarone drip. -On p.o. amiodarone and Eliquis per cardiology. -Optimize Mg and K.  Chronic diastolic CHF: Echo with  preserved EF and G1 DD.  BNP lower than baseline.  Reports good urine output although not captured.  Creatinine improving.  I&O incomplete.  Soft blood pressures. -Per cardiology-on p.o. Lasix now -Monitor fluid status, renal function and electrolytes -Defer diuretics to cardiology.  AKI/azotemia: Baseline Cr 0.8-0.9> 1.01 (admit)> > 1.49> 1.33> 1.19.  BUN 43>54>58> 42.  AKI likely hemodynamically mediated. -Continue monitoring -Avoid nephrotoxic meds  Metabolic alkalosis: Concerning.  Likely a combination of contraction alkalosis and compensation from hypercapnia -Recommended minimum oxygen to keep saturation above 90% -Could benefit from Diamox but soft blood pressures.  Hyperphosphatemia: Likely due to renal failure.  Resolved. -Continue monitoring  Hypotension: Soft blood pressures.  Just on p.o. Lasix -Continue monitoring  Elevated liver enzymes/hyperbilirubinemia: Likely congestive hepatopathy.  Improved. Liver cirrhosis-due to pulmonary hypertension/heart failure? -Continue monitoring.  Normocytic anemia: Baseline Hgb 11-12> 12.0 (admit)>>> 9.7.  Suspect hemodilution -Continue monitoring  Acute on chronic COPD -Continue Trelegy Ellipta and nebulizers -On steroid for pericardial effusion  Left chest wall pain-TTP -Add Lidoderm patch.  Debility/physical deconditioning -PT/OT  Goal of care-significant cardiopulmonary comorbidities as above.  Prognosis is not great.  She is a still full code.  This would probably pose more harm than benefit. -Appreciate input by palliative care              DVT prophylaxis:    Code Status: Full code Family Communication: Updated patient's husband at bedside Status is: Inpatient  Remains inpatient appropriate  because:Hemodynamically unstable, Ongoing diagnostic testing needed not appropriate for outpatient work up, IV treatments appropriate due to intensity of illness or inability to take PO and Inpatient level of care  appropriate due to severity of illness   Dispo: The patient is from: Home              Anticipated d/c is to:  To be determined              Anticipated d/c date is: > 3 days              Patient currently is not medically stable to d/c.       Consultants:  Cardiology Advanced heart failure team Infectious disease Palliative care medicine   Sch Meds:  Scheduled Meds: . acetaminophen  650 mg Oral Q8H  . acidophilus  1 capsule Oral Daily  . ambrisentan  10 mg Oral Daily  . amiodarone  200 mg Oral Daily  . apixaban  5 mg Oral BID  . aspirin EC  81 mg Oral Daily  . colchicine  0.3 mg Oral Daily  . fluticasone  2 spray Each Nare Daily  . umeclidinium bromide  2 puff Inhalation Daily   And  . fluticasone furoate-vilanterol  2 puff Inhalation Daily  . furosemide  40 mg Oral Daily  . guaiFENesin  600 mg Oral BID  . lidocaine  1 patch Transdermal Q24H  . nystatin  5 mL Oral QID  . pantoprazole  40 mg Oral Q1200  . predniSONE  40 mg Oral Q breakfast  . senna  1 tablet Oral Daily  . tadalafil (PAH)  40 mg Oral Daily   Continuous Infusions:  PRN Meds:.ondansetron **OR** ondansetron (ZOFRAN) IV, traMADol  Antimicrobials: Anti-infectives (From admission, onward)   Start     Dose/Rate Route Frequency Ordered Stop   02/21/20 0600  vancomycin (VANCOCIN) IVPB 1000 mg/200 mL premix  Status:  Discontinued        1,000 mg 200 mL/hr over 60 Minutes Intravenous Every 24 hours 02/20/20 0621 02/23/20 1223   02/20/20 1000  cefTRIAXone (ROCEPHIN) 2 g in sodium chloride 0.9 % 100 mL IVPB  Status:  Discontinued        2 g 200 mL/hr over 30 Minutes Intravenous Every 24 hours 02/19/20 1342 02/20/20 0616   02/20/20 1000  azithromycin (ZITHROMAX) 500 mg in sodium chloride 0.9 % 250 mL IVPB  Status:  Discontinued        500 mg 250 mL/hr over 60 Minutes Intravenous Every 24 hours 02/19/20 1342 02/20/20 1019   02/20/20 1000  cefTRIAXone (ROCEPHIN) 2 g in sodium chloride 0.9 % 100 mL IVPB   Status:  Discontinued        2 g 200 mL/hr over 30 Minutes Intravenous Every 24 hours 02/20/20 0913 02/21/20 0749   02/20/20 0645  vancomycin (VANCOREADY) IVPB 1250 mg/250 mL        1,250 mg 166.7 mL/hr over 90 Minutes Intravenous  Once 02/20/20 0621 02/21/20 1649   02/19/20 1015  cefTRIAXone (ROCEPHIN) 1 g in sodium chloride 0.9 % 100 mL IVPB        1 g 200 mL/hr over 30 Minutes Intravenous  Once 02/19/20 1008 02/19/20 1151   02/19/20 1015  azithromycin (ZITHROMAX) 500 mg in sodium chloride 0.9 % 250 mL IVPB        500 mg 250 mL/hr over 60 Minutes Intravenous  Once 02/19/20 1008 02/19/20 1324       I have personally reviewed  the following labs and images: CBC: Recent Labs  Lab 02/19/20 0405 02/19/20 0405 02/20/20 0240 02/21/20 0227 02/22/20 0259 02/23/20 0339 02/25/20 0405  WBC 11.4*   < > 17.9* 15.8* 11.2* 9.8 10.0  NEUTROABS 9.8*  --   --   --   --   --  8.6*  HGB 12.0   < > 11.0* 10.1* 9.7* 9.8* 9.2*  HCT 38.5   < > 35.6* 32.2* 30.9* 31.5* 30.2*  MCV 101.0*   < > 100.3* 100.0 99.4 101.0* 100.7*  PLT 309   < > 251 250 234 232 193   < > = values in this interval not displayed.   BMP &GFR Recent Labs  Lab 02/21/20 0227 02/22/20 0259 02/23/20 0339 02/24/20 0433 02/25/20 0405  NA 140 139 140 142 145  K 3.8 3.6 4.0 3.6 3.5  CL 89* 90* 90* 91* 93*  CO2 38* 38* 40* 41* 43*  GLUCOSE 129* 129* 119* 108* 102*  BUN 43* 54* 58* 55* 42*  CREATININE 1.49* 1.39* 1.35* 1.32* 1.19*  CALCIUM 8.6* 8.5* 8.6* 8.5* 8.3*  MG 2.5* 2.7* 3.2* 3.0* 2.9*  PHOS 5.4* 4.8* 4.4 3.7 2.5   Estimated Creatinine Clearance: 39.4 mL/min (A) (by C-G formula based on SCr of 1.19 mg/dL (H)). Liver & Pancreas: Recent Labs  Lab 02/19/20 0405 02/19/20 0405 02/21/20 0227 02/22/20 0259 02/23/20 0339 02/24/20 0433 02/25/20 0405  AST 90*  --   --   --  26  --   --   ALT 34  --   --   --  47*  --   --   ALKPHOS 130*  --   --   --  92  --   --   BILITOT 1.9*  --   --   --  0.8  --   --   PROT  6.1*  --   --   --  5.8*  --   --   ALBUMIN 3.1*   < > 2.9* 2.7* 2.7*  2.7* 2.6* 2.6*   < > = values in this interval not displayed.   No results for input(s): LIPASE, AMYLASE in the last 168 hours. Recent Labs  Lab 02/23/20 1040 02/24/20 0433  AMMONIA 52* 34   Diabetic: No results for input(s): HGBA1C in the last 72 hours. No results for input(s): GLUCAP in the last 168 hours. Cardiac Enzymes: No results for input(s): CKTOTAL, CKMB, CKMBINDEX, TROPONINI in the last 168 hours. No results for input(s): PROBNP in the last 8760 hours. Coagulation Profile: No results for input(s): INR, PROTIME in the last 168 hours. Thyroid Function Tests: No results for input(s): TSH, T4TOTAL, FREET4, T3FREE, THYROIDAB in the last 72 hours. Lipid Profile: No results for input(s): CHOL, HDL, LDLCALC, TRIG, CHOLHDL, LDLDIRECT in the last 72 hours. Anemia Panel: Recent Labs    02/23/20 1040  VITAMINB12 477  FOLATE 9.9  FERRITIN 426*  TIBC 259  IRON 42  RETICCTPCT 2.7   Urine analysis:    Component Value Date/Time   COLORURINE YELLOW 02/23/2020 1319   APPEARANCEUR HAZY (A) 02/23/2020 1319   LABSPEC 1.019 02/23/2020 1319   PHURINE 5.0 02/23/2020 1319   GLUCOSEU NEGATIVE 02/23/2020 1319   GLUCOSEU NEGATIVE 07/07/2017 1625   HGBUR NEGATIVE 02/23/2020 1319   HGBUR negative 12/22/2007 1139   BILIRUBINUR NEGATIVE 02/23/2020 1319   KETONESUR NEGATIVE 02/23/2020 1319   PROTEINUR NEGATIVE 02/23/2020 1319   UROBILINOGEN 0.2 07/07/2017 1625   NITRITE NEGATIVE 02/23/2020 1319  LEUKOCYTESUR NEGATIVE 02/23/2020 1319   Sepsis Labs: Invalid input(s): PROCALCITONIN, [place]  Microbiology: Recent Results (from the past 240 hour(s))  SARS Coronavirus 2 by RT PCR (hospital order, performed in [place] hospital lab) Nasopharyngeal Nasopharyngeal Swab     Status: None   Collection Time: 02/19/20  5:27 AM   Specimen: Nasopharyngeal Swab  Result Value Ref Range Status   SARS Coronavirus 2  NEGATIVE NEGATIVE Final    Comment: (NOTE) SARS-CoV-2 target nucleic acids are NOT DETECTED.  The SARS-CoV-2 RNA is generally detectable in upper and lower respiratory specimens during the acute phase of infection. The lowest concentration of SARS-CoV-2 viral copies this assay can detect is 250 copies / mL. A negative result does not preclude SARS-CoV-2 infection and should not be used as the sole basis for treatment or other patient management decisions.  A negative result may occur with improper specimen collection / handling, submission of specimen other than nasopharyngeal swab, presence of viral mutation(s) within the areas targeted by this assay, and inadequate number of viral copies (<250 copies / mL). A negative result must be combined with clinical observations, patient history, and epidemiological information.  Fact Sheet for Patients:   StrictlyIdeas.no  Fact Sheet for Healthcare Providers: BankingDealers.co.za  This test is not yet approved or  cleared by the [place] FDA and has been authorized for detection and/or diagnosis of SARS-CoV-2 by FDA under an Emergency Use Authorization (EUA).  This EUA will remain in effect (meaning this test can be used) for the duration of the COVID-19 declaration under Section 564(b)(1) of the Act, 21 U.S.C. section 360bbb-3(b)(1), unless the authorization is terminated or revoked sooner.  Performed at [facility], [place] [address] [place], [place] [address]   Culture, blood (routine x 2)     Status: Abnormal   Collection Time: 02/19/20 10:36 AM   Specimen: BLOOD RIGHT HAND  Result Value Ref Range Status   Specimen Description BLOOD RIGHT HAND  Final   Special Requests   Final    BOTTLES DRAWN AEROBIC AND ANAEROBIC Blood Culture adequate volume   Culture  Setup Time   Final    GRAM POSITIVE COCCI IN CLUSTERS IN BOTH AEROBIC AND ANAEROBIC BOTTLES CRITICAL VALUE NOTED.   VALUE IS CONSISTENT WITH PREVIOUSLY REPORTED AND CALLED VALUE. Performed at [facility], [place] [address] [place], [place] [address]    Culture (A)  Final    STAPHYLOCOCCUS EPIDERMIDIS STAPHYLOCOCCUS HOMINIS    Report Status 02/23/2020 FINAL  Final   Organism ID, Bacteria STAPHYLOCOCCUS EPIDERMIDIS  Final   Organism ID, Bacteria STAPHYLOCOCCUS HOMINIS  Final      Susceptibility   Staphylococcus epidermidis - MIC*    CIPROFLOXACIN 4 RESISTANT Resistant     ERYTHROMYCIN >=8 RESISTANT Resistant     GENTAMICIN <=0.5 SENSITIVE Sensitive     OXACILLIN >=4 RESISTANT Resistant     TETRACYCLINE >=16 RESISTANT Resistant     VANCOMYCIN 1 SENSITIVE Sensitive     TRIMETH/SULFA 160 RESISTANT Resistant     CLINDAMYCIN RESISTANT Resistant     RIFAMPIN <=0.5 SENSITIVE Sensitive     Inducible Clindamycin POSITIVE Resistant     * STAPHYLOCOCCUS EPIDERMIDIS   Staphylococcus hominis - MIC*    CIPROFLOXACIN <=0.5 SENSITIVE Sensitive     ERYTHROMYCIN <=0.25 SENSITIVE Sensitive     GENTAMICIN <=0.5 SENSITIVE Sensitive     OXACILLIN <=0.25 SENSITIVE Sensitive     TETRACYCLINE <=1 SENSITIVE Sensitive     VANCOMYCIN <=0.5 SENSITIVE Sensitive  TRIMETH/SULFA <=10 SENSITIVE Sensitive     CLINDAMYCIN <=0.25 SENSITIVE Sensitive     RIFAMPIN <=0.5 SENSITIVE Sensitive     Inducible Clindamycin NEGATIVE Sensitive     * STAPHYLOCOCCUS HOMINIS  Culture, blood (routine x 2)     Status: Abnormal   Collection Time: 02/19/20 10:48 AM   Specimen: BLOOD  Result Value Ref Range Status   Specimen Description BLOOD RIGHT ANTECUBITAL  Final   Special Requests   Final    BOTTLES DRAWN AEROBIC AND ANAEROBIC Blood Culture adequate volume   Culture  Setup Time   Final    GRAM POSITIVE COCCI IN CLUSTERS IN BOTH AEROBIC AND ANAEROBIC BOTTLES CRITICAL RESULT CALLED TO, READ BACK BY AND VERIFIED WITH: PHRMD V BRYK _0  02/20/20 BY S GEZAHEGN Performed at [facility], [place] [address] [place], [place]  [address]    Culture (A)  Final    STAPHYLOCOCCUS EPIDERMIDIS STAPHYLOCOCCUS HOMINIS    Report Status 02/23/2020 FINAL  Final   Organism ID, Bacteria STAPHYLOCOCCUS EPIDERMIDIS  Final   Organism ID, Bacteria STAPHYLOCOCCUS HOMINIS  Final      Susceptibility   Staphylococcus epidermidis - MIC*    CIPROFLOXACIN >=8 RESISTANT Resistant     ERYTHROMYCIN >=8 RESISTANT Resistant     GENTAMICIN <=0.5 SENSITIVE Sensitive     OXACILLIN >=4 RESISTANT Resistant     TETRACYCLINE <=1 SENSITIVE Sensitive     VANCOMYCIN 1 SENSITIVE Sensitive     TRIMETH/SULFA 160 RESISTANT Resistant     CLINDAMYCIN RESISTANT Resistant     RIFAMPIN <=0.5 SENSITIVE Sensitive     Inducible Clindamycin POSITIVE Resistant     * STAPHYLOCOCCUS EPIDERMIDIS   Staphylococcus hominis - MIC*    CIPROFLOXACIN 4 RESISTANT Resistant     ERYTHROMYCIN >=8 RESISTANT Resistant     GENTAMICIN <=0.5 SENSITIVE Sensitive     OXACILLIN >=4 RESISTANT Resistant     TETRACYCLINE <=1 SENSITIVE Sensitive     VANCOMYCIN 1 SENSITIVE Sensitive     TRIMETH/SULFA 160 RESISTANT Resistant     CLINDAMYCIN RESISTANT Resistant     RIFAMPIN <=0.5 SENSITIVE Sensitive     Inducible Clindamycin POSITIVE Resistant     * STAPHYLOCOCCUS HOMINIS  Blood Culture ID Panel (Reflexed)     Status: Abnormal   Collection Time: 02/19/20 10:48 AM  Result Value Ref Range Status   Enterococcus species NOT DETECTED NOT DETECTED Final   Listeria monocytogenes NOT DETECTED NOT DETECTED Final   Staphylococcus species DETECTED (A) NOT DETECTED Final    Comment: Methicillin (oxacillin) resistant coagulase negative staphylococcus. Possible blood culture contaminant (unless isolated from more than one blood culture draw or clinical case suggests pathogenicity). No antibiotic treatment is indicated for blood  culture contaminants. CRITICAL RESULT CALLED TO, READ BACK BY AND VERIFIED WITH: PHRMD V BRYK _1  02/20/20 BY S GEZAHEGN    Staphylococcus aureus (BCID) NOT DETECTED  NOT DETECTED Final   Methicillin resistance DETECTED (A) NOT DETECTED Final    Comment: CRITICAL RESULT CALLED TO, READ BACK BY AND VERIFIED WITH: PHRMD V BRYK _2  02/20/20 BY S GEZAHEGN    Streptococcus species NOT DETECTED NOT DETECTED Final   Streptococcus agalactiae NOT DETECTED NOT DETECTED Final   Streptococcus pneumoniae NOT DETECTED NOT DETECTED Final   Streptococcus pyogenes NOT DETECTED NOT DETECTED Final   Acinetobacter baumannii NOT DETECTED NOT DETECTED Final   Enterobacteriaceae species NOT DETECTED NOT DETECTED Final   Enterobacter cloacae complex NOT DETECTED NOT DETECTED Final   Escherichia coli NOT DETECTED NOT DETECTED  Final   Klebsiella oxytoca NOT DETECTED NOT DETECTED Final   Klebsiella pneumoniae NOT DETECTED NOT DETECTED Final   Proteus species NOT DETECTED NOT DETECTED Final   Serratia marcescens NOT DETECTED NOT DETECTED Final   Haemophilus influenzae NOT DETECTED NOT DETECTED Final   Neisseria meningitidis NOT DETECTED NOT DETECTED Final   Pseudomonas aeruginosa NOT DETECTED NOT DETECTED Final   Candida albicans NOT DETECTED NOT DETECTED Final   Candida glabrata NOT DETECTED NOT DETECTED Final   Candida krusei NOT DETECTED NOT DETECTED Final   Candida parapsilosis NOT DETECTED NOT DETECTED Final   Candida tropicalis NOT DETECTED NOT DETECTED Final    Comment: Performed at [facility], [place] [address] [place], [place] [address]  MRSA PCR Screening     Status: None   Collection Time: 02/19/20  8:59 PM   Specimen: Nasopharyngeal  Result Value Ref Range Status   MRSA by PCR NEGATIVE NEGATIVE Final    Comment:        The GeneXpert MRSA Assay (FDA approved for NASAL specimens only), is one component of a comprehensive MRSA colonization surveillance program. It is not intended to diagnose MRSA infection nor to guide or monitor treatment for MRSA infections. Performed at [facility], [place] [address], [place], [place] [address]   Culture,  blood (Routine X 2) w Reflex to ID Panel     Status: None   Collection Time: 02/20/20 10:43 AM   Specimen: BLOOD LEFT ARM  Result Value Ref Range Status   Specimen Description BLOOD LEFT ARM  Final   Special Requests   Final    BOTTLES DRAWN AEROBIC ONLY Blood Culture adequate volume   Culture   Final    NO GROWTH 5 DAYS Performed at [facility], [place] [address], [place], [place] [address]    Report Status 02/25/2020 FINAL  Final  Culture, blood (Routine X 2) w Reflex to ID Panel     Status: None   Collection Time: 02/20/20 10:48 AM   Specimen: BLOOD LEFT WRIST  Result Value Ref Range Status   Specimen Description BLOOD LEFT WRIST  Final   Special Requests   Final    BOTTLES DRAWN AEROBIC ONLY Blood Culture results may not be optimal due to an inadequate volume of blood received in culture bottles   Culture   Final    NO GROWTH 5 DAYS Performed at [facility], [place] [address], [place], [place] [address]    Report Status 02/25/2020 FINAL  Final    Radiology Studies: No results found.   Terrez Ander T. [place]  If 7PM-7AM, please contact night-coverage [email or web address] Password [email or web address] 02/25/2020, 2:25 PM

## 2020-02-26 DIAGNOSIS — R7881 Bacteremia: Secondary | ICD-10-CM

## 2020-02-26 LAB — CBC
HCT: 32.3 % — ABNORMAL LOW (ref 36.0–46.0)
Hemoglobin: 10.1 g/dL — ABNORMAL LOW (ref 12.0–15.0)
MCH: 31.3 pg (ref 26.0–34.0)
MCHC: 31.3 g/dL (ref 30.0–36.0)
MCV: 100 fL (ref 80.0–100.0)
Platelets: 202 10*3/uL (ref 150–400)
RBC: 3.23 MIL/uL — ABNORMAL LOW (ref 3.87–5.11)
RDW: 16.2 % — ABNORMAL HIGH (ref 11.5–15.5)
WBC: 11.6 10*3/uL — ABNORMAL HIGH (ref 4.0–10.5)
nRBC: 0.3 % — ABNORMAL HIGH (ref 0.0–0.2)

## 2020-02-26 LAB — COMPREHENSIVE METABOLIC PANEL
ALT: 27 U/L (ref 0–44)
AST: 13 U/L — ABNORMAL LOW (ref 15–41)
Albumin: 2.8 g/dL — ABNORMAL LOW (ref 3.5–5.0)
Alkaline Phosphatase: 78 U/L (ref 38–126)
Anion gap: 9 (ref 5–15)
BUN: 33 mg/dL — ABNORMAL HIGH (ref 8–23)
CO2: 41 mmol/L — ABNORMAL HIGH (ref 22–32)
Calcium: 8.2 mg/dL — ABNORMAL LOW (ref 8.9–10.3)
Chloride: 92 mmol/L — ABNORMAL LOW (ref 98–111)
Creatinine, Ser: 1.07 mg/dL — ABNORMAL HIGH (ref 0.44–1.00)
GFR calc Af Amer: >60 mL/min (ref >60)
GFR calc non Af Amer: 52 mL/min — ABNORMAL LOW (ref >60)
Glucose, Bld: 111 mg/dL — ABNORMAL HIGH (ref 70–99)
Potassium: 4.4 mmol/L (ref 3.5–5.1)
Sodium: 142 mmol/L (ref 135–145)
Total Bilirubin: 0.6 mg/dL (ref 0.3–1.2)
Total Protein: 5.5 g/dL — ABNORMAL LOW (ref 6.5–8.1)

## 2020-02-26 LAB — CORTISOL-AM, BLOOD: Cortisol - AM: 3 ug/dL — ABNORMAL LOW (ref 6.7–22.6)

## 2020-02-26 LAB — MAGNESIUM: Magnesium: 2.6 mg/dL — ABNORMAL HIGH (ref 1.7–2.4)

## 2020-02-26 MED ORDER — TRAMADOL HCL 50 MG PO TABS
50.0000 mg | ORAL_TABLET | Freq: Four times a day (QID) | ORAL | Status: DC | PRN
  Administered 2020-02-26 – 2020-02-28 (×6): 50 mg via ORAL
  Filled 2020-02-26 (×6): qty 1

## 2020-02-26 MED ORDER — COSYNTROPIN 0.25 MG IJ SOLR
0.2500 mg | Freq: Once | INTRAMUSCULAR | Status: AC
  Administered 2020-02-27: 0.25 mg via INTRAVENOUS
  Filled 2020-02-26: qty 0.25

## 2020-02-26 MED ORDER — HYDROXYZINE HCL 10 MG PO TABS
10.0000 mg | ORAL_TABLET | Freq: Two times a day (BID) | ORAL | Status: DC | PRN
  Administered 2020-02-26 – 2020-03-08 (×13): 10 mg via ORAL
  Filled 2020-02-26 (×13): qty 1

## 2020-02-26 NOTE — Progress Notes (Signed)
Patient ID: Linda Stevens, female   DOB: [birth year], 74 y.o.   MRN: [id number]  PROGRESS NOTE    Linda Stevens  RCV:[id number] DOB: 11/06/1945 DOA: 02/19/2020 PCP: [name], MD    Brief Narrative:  74 year old female with history of COPD/chronic RF on 3 L, diastolic CHF, pulmonary HTN, HTN and HLD presenting with progressive shortness of breath for 1 week.  Tried with a course of Z-Pak and a steroid taper by her pulmonologist on 6/9 but continues to have progressive shortness of breath.  In ED, slightly hypotensive.  Required 6 L by [place].  WBC 11.4.  AST 90.  Total bili 1.9.  Lactic acid 2.8.  BNP 164.  Trop negative x2.  CXR concerning for cardiomegaly with small pleural effusion and by basilar atelectasis.  CTA chest showed sizable pericardial effusion, bilateral pleural effusions and consolidation concerning for pneumonia and pulmonary HTN.  Cultures drawn.  Received IV Solu-Medrol, morphine, Rocephin and azithromycin,and admitted for acute on chronic respiratory failure due to pneumonia, pleural effusions and pericardial effusion.  Cardiology consulted for pericardial effusion.  Echo obtained and revealed dilated and fixed IVC although no other features of tamponade.  The next day, blood cultures with GPC's in clusters in 4/4.  BICD with coag negative MR staph.  Vancomycin added.  ID consulted.  Azithromycin and ceftriaxone discontinued.  Repeat blood culture obtained and NGTD. Eventually, vancomycin discontinued as susceptibilities on initial blood culture were different suggesting contamination versus true bacteremia.  Patient went into A. fib with RVR with hypotension on 6/14. Started on amiodarone drip and converted to sinus rhythm. Advanced heart failure team and palliative medicine involved as well.   Assessment & Plan:   Principal Problem:   Positive blood cultures Active Problems:   Goals of care, counseling/discussion   Congestive heart failure (CHF) (HCC)    Pulmonary artery hypertension (HCC)   Acute on chronic diastolic congestive heart failure (HCC)   Acute on chronic respiratory failure with hypoxia (HCC)   Pericardial effusion   Acute respiratory failure with hypoxia and hypercapnea   Hepatic cirrhosis (HCC)   Pleural effusion, bilateral   Community acquired pneumonia   New onset atrial fibrillation ([place])   Palliative care by specialist  Acute metabolic encephalopathy: Oriented to self, place, person and month.  Slow to respond to questions.  She has hypercarbia to 79 but well compensated.  Ammonia slightly elevated to 52>34.  TSH and B12 within normal.  Morphine, tramadol and Phenergan could contribute. -Lactulose discontinued after improvement in her ammonia -Order scheduled Tylenol.  Weaned off opiate and promethazine -CT head and intermittent BiPAP if no improvement -Reorientation and delirium precautions.  Abnormal blood culture-deemed to be contaminant.  Repeat blood culture on 6/14 NGTD.  TTE without vegetation. Leukocytosis resolved. -Appreciate ID guidance-recommended discontinue vancomycin and signed off. -Continue vancomycin 6/14> 6/17 -Ceftriaxone and azithromycin discontinued.   Community-acquired pneumonia? CTA concerning for bibasilar consolidation with pleural effusion but difficult diagnosis given other confounding factors. -Received vancomycin, ceftriaxone and azithromycin as above. -Off antibiotics now per ID recommendation -Incentive spirometry and flutter valve  Acute on chronic respiratory failure with hypoxia: Multifactorial including pericardiac and pleural effusion, pulmonary hypertension, underlying COPD, possible pneumonia and A. fib with RVR.  On 3 L at baseline. ABG suggestive for well compensated chronic respiratory acidosis. -Wean oxygen as able-minimum oxygen to keep saturation above 90%! On 4L now -Treat potential etiologies  Pericardial effusion: Improved.  No tamponade. Repeat echo with minimal  effusion but possible inflammatory  pericarditis.  ESR 63.  Rheumatologic exam negative so far. -Cardiology managing-on colchicine and steroid.  Pleural effusion: Looks a small on review of CTA chest -Continue antibiotics as above  Pulmonary hypertension:  PAPP 87 mmHg.  RVSP 50.8 on limited echo on 6/18. -Advanced heart failure team following. -On sildenafil and ambrisentan  A. fib with RVR: Noted to have A. fib on EKG obtained by EMS.  Went into RVR on 6/14 and 6/15.  Converted to sinus rhythm with amiodarone drip. -On p.o. amiodarone and Eliquis per cardiology. -Optimize Mg and K.  Chronic diastolic CHF: Echo with preserved EF and G1 DD.  BNP lower than baseline.  Reports good urine output although not captured.  Creatinine improving.  I&O incomplete.  Soft blood pressures. -Per cardiology-on p.o. Lasix now -Monitor fluid status, renal function and electrolytes -Defer diuretics to cardiology.  AKI/azotemia: Baseline Cr 0.8-0.9> 1.01 (admit)> > 1.49> 1.33> 1.19>1.09.  BUN 43>54>58> 42>33.  AKI likely hemodynamically mediated. -Continue monitoring -Avoid nephrotoxic meds  Metabolic alkalosis: Concerning.  Likely a combination of contraction alkalosis and compensation from hypercapnia -Recommended minimum oxygen to keep saturation above 90% -Could benefit from Diamox but soft blood pressures.  Hyperphosphatemia: Likely due to renal failure.  Resolved. -Continue monitoring  Hypermagnesemia: Improving, continue to trend  Hypotension: Soft blood pressures.  Just on p.o. Lasix -Continue monitoring  Elevated liver enzymes/hyperbilirubinemia: Likely congestive hepatopathy.  Improved. Liver cirrhosis-due to pulmonary hypertension/heart failure? -Continue monitoring.  Normocytic anemia: Baseline Hgb 11-12> 12.0 (admit)>>> 9.7.  Suspect hemodilution -Continue monitoring  Acute on chronic COPD -Continue Trelegy Ellipta and nebulizers -On steroid for pericardial  effusion  Left chest wall pain-TTP -Add Lidoderm patch.  Debility/physical deconditioning -PT/OT  Goal of care-significant cardiopulmonary comorbidities as above.  Prognosis is not great.  She is a still full code.  This would probably pose more harm than benefit. -Appreciate input by palliative care  Microbiology summarized: COVID-19 PCR negative. MRSA PCR negative. Blood cultures with GPC's in clusters in 4/4. BICD with coag negative methicillin-resistant staph with different susceptibilities deemed to be contaminant.  DVT prophylaxis: [id number] Code Status: Full code  Family Communication: Daughter at bedside Disposition Plan: SNF vs. IP rehab   Consultants:   Cardiology  Infectious Disease  Palliative care  Procedures:  None  Antimicrobials: Anti-infectives (From admission, onward)   Start     Dose/Rate Route Frequency Ordered Stop   02/21/20 0600  vancomycin (VANCOCIN) IVPB 1000 mg/200 mL premix  Status:  Discontinued        1,000 mg 200 mL/hr over 60 Minutes Intravenous Every 24 hours 02/20/20 0621 02/23/20 1223   02/20/20 1000  cefTRIAXone (ROCEPHIN) 2 g in sodium chloride 0.9 % 100 mL IVPB  Status:  Discontinued        2 g 200 mL/hr over 30 Minutes Intravenous Every 24 hours 02/19/20 1342 02/20/20 0616   02/20/20 1000  azithromycin (ZITHROMAX) 500 mg in sodium chloride 0.9 % 250 mL IVPB  Status:  Discontinued        500 mg 250 mL/hr over 60 Minutes Intravenous Every 24 hours 02/19/20 1342 02/20/20 1019   02/20/20 1000  cefTRIAXone (ROCEPHIN) 2 g in sodium chloride 0.9 % 100 mL IVPB  Status:  Discontinued        2 g 200 mL/hr over 30 Minutes Intravenous Every 24 hours 02/20/20 0913 02/21/20 0749   02/20/20 0645  vancomycin (VANCOREADY) IVPB 1250 mg/250 mL        1,250 mg 166.7 mL/hr over 90 Minutes  Intravenous  Once 02/20/20 0621 02/21/20 1649   02/19/20 1015  cefTRIAXone (ROCEPHIN) 1 g in sodium chloride 0.9 % 100 mL IVPB        1 g 200 mL/hr over 30  Minutes Intravenous  Once 02/19/20 1008 02/19/20 1151   02/19/20 1015  azithromycin (ZITHROMAX) 500 mg in sodium chloride 0.9 % 250 mL IVPB        500 mg 250 mL/hr over 60 Minutes Intravenous  Once 02/19/20 1008 02/19/20 1324       Subjective: Feels weak. She is hopeful about IP rehab as adjunct.  Objective: Vitals:   02/26/20 0957 02/26/20 1011 02/26/20 1223 02/26/20 1606  BP: (!) 109/42  (!) 116/47 (!) 110/44  Pulse:   60 61  Resp:   16 16  Temp: 97.6 F (36.4 C)  97.7 F (36.5 C) 97.9 F (36.6 C)  TempSrc: Oral  Oral Oral  SpO2:  97% 92% 92%  Weight:      Height:        Intake/Output Summary (Last 24 hours) at 02/26/2020 1707 Last data filed at 02/26/2020 1500 Gross per 24 hour  Intake 802 ml  Output 700 ml  Net 102 ml   Filed Weights   02/24/20 0312 02/25/20 0357 02/26/20 0430  Weight: 65.1 kg 65.1 kg 65.9 kg    Examination:  General exam: Appears calm and comfortable  Respiratory system: scattered rales Respiratory effort normal. Cardiovascular system: S1 & S2 heard, RRR. + JVD Gastrointestinal system: Abdomen is nondistended, soft and nontender.  Central nervous system: Alert and oriented. No focal neurological deficits. Extremities: Symmetric  Skin: No rashes Psychiatry: Judgement and insight appear normal. Mood & affect appropriate.     Data Reviewed: I have personally reviewed following labs and imaging studies  CBC: Recent Labs  Lab 02/21/20 0227 02/22/20 0259 02/23/20 0339 02/25/20 0405 02/26/20 0718  WBC 15.8* 11.2* 9.8 10.0 11.6*  NEUTROABS  --   --   --  8.6*  --   HGB 10.1* 9.7* 9.8* 9.2* 10.1*  HCT 32.2* 30.9* 31.5* 30.2* 32.3*  MCV 100.0 99.4 101.0* 100.7* 100.0  PLT 250 234 232 193 858   Basic Metabolic Panel: Recent Labs  Lab 02/21/20 0227 02/21/20 0227 02/22/20 0259 02/23/20 0339 02/24/20 0433 02/25/20 0405 02/26/20 0718  NA 140   < > 139 140 142 145 142  K 3.8   < > 3.6 4.0 3.6 3.5 4.4  CL 89*   < > 90* 90* 91* 93*  92*  CO2 38*   < > 38* 40* 41* 43* 41*  GLUCOSE 129*   < > 129* 119* 108* 102* 111*  BUN 43*   < > 54* 58* 55* 42* 33*  CREATININE 1.49*   < > 1.39* 1.35* 1.32* 1.19* 1.07*  CALCIUM 8.6*   < > 8.5* 8.6* 8.5* 8.3* 8.2*  MG 2.5*   < > 2.7* 3.2* 3.0* 2.9* 2.6*  PHOS 5.4*  --  4.8* 4.4 3.7 2.5  --    < > = values in this interval not displayed.   GFR: Estimated Creatinine Clearance: 44 mL/min (A) (by C-G formula based on SCr of 1.07 mg/dL (H)). Liver Function Tests: Recent Labs  Lab 02/22/20 0259 02/23/20 0339 02/24/20 0433 02/25/20 0405 02/26/20 0718  AST  --  26  --   --  13*  ALT  --  47*  --   --  27  ALKPHOS  --  92  --   --  78  BILITOT  --  0.8  --   --  0.6  PROT  --  5.8*  --   --  5.5*  ALBUMIN 2.7* 2.7*  2.7* 2.6* 2.6* 2.8*    Recent Labs  Lab 02/23/20 1040 02/24/20 0433  AMMONIA 52* 34   Sepsis Labs: Recent Labs  Lab 02/20/20 0833 02/20/20 1043  LATICACIDVEN 2.2* 2.1*    Recent Results (from the past 240 hour(s))  SARS Coronavirus 2 by RT PCR (hospital order, performed in [facility] hospital lab) Nasopharyngeal Nasopharyngeal Swab     Status: None   Collection Time: 02/19/20  5:27 AM   Specimen: Nasopharyngeal Swab  Result Value Ref Range Status   SARS Coronavirus 2 NEGATIVE NEGATIVE Final    Comment: (NOTE) SARS-CoV-2 target nucleic acids are NOT DETECTED.  The SARS-CoV-2 RNA is generally detectable in upper and lower respiratory specimens during the acute phase of infection. The lowest concentration of SARS-CoV-2 viral copies this assay can detect is 250 copies / mL. A negative result does not preclude SARS-CoV-2 infection and should not be used as the sole basis for treatment or other patient management decisions.  A negative result may occur with improper specimen collection / handling, submission of specimen other than nasopharyngeal swab, presence of viral mutation(s) within the areas targeted by this assay, and inadequate number of viral  copies (<250 copies / mL). A negative result must be combined with clinical observations, patient history, and epidemiological information.  Fact Sheet for Patients:   StrictlyIdeas.no  Fact Sheet for Healthcare Providers: BankingDealers.co.za  This test is not yet approved or  cleared by the [place] FDA and has been authorized for detection and/or diagnosis of SARS-CoV-2 by FDA under an Emergency Use Authorization (EUA).  This EUA will remain in effect (meaning this test can be used) for the duration of the COVID-19 declaration under Section 564(b)(1) of the Act, 21 U.S.C. section 360bbb-3(b)(1), unless the authorization is terminated or revoked sooner.  Performed at [facility], [place] [address], [place], [place] [address]   Culture, blood (routine x 2)     Status: Abnormal   Collection Time: 02/19/20 10:36 AM   Specimen: BLOOD RIGHT HAND  Result Value Ref Range Status   Specimen Description BLOOD RIGHT HAND  Final   Special Requests   Final    BOTTLES DRAWN AEROBIC AND ANAEROBIC Blood Culture adequate volume   Culture  Setup Time   Final    GRAM POSITIVE COCCI IN CLUSTERS IN BOTH AEROBIC AND ANAEROBIC BOTTLES CRITICAL VALUE NOTED.  VALUE IS CONSISTENT WITH PREVIOUSLY REPORTED AND CALLED VALUE. Performed at [facility], [place] [address] [place], [place] [address]    Culture (A)  Final    STAPHYLOCOCCUS EPIDERMIDIS STAPHYLOCOCCUS HOMINIS    Report Status 02/23/2020 FINAL  Final   Organism ID, Bacteria STAPHYLOCOCCUS EPIDERMIDIS  Final   Organism ID, Bacteria STAPHYLOCOCCUS HOMINIS  Final      Susceptibility   Staphylococcus epidermidis - MIC*    CIPROFLOXACIN 4 RESISTANT Resistant     ERYTHROMYCIN >=8 RESISTANT Resistant     GENTAMICIN <=0.5 SENSITIVE Sensitive     OXACILLIN >=4 RESISTANT Resistant     TETRACYCLINE >=16 RESISTANT Resistant     VANCOMYCIN 1 SENSITIVE Sensitive     TRIMETH/SULFA 160  RESISTANT Resistant     CLINDAMYCIN RESISTANT Resistant     RIFAMPIN <=0.5 SENSITIVE Sensitive     Inducible Clindamycin POSITIVE Resistant     *  STAPHYLOCOCCUS EPIDERMIDIS   Staphylococcus hominis - MIC*    CIPROFLOXACIN <=0.5 SENSITIVE Sensitive     ERYTHROMYCIN <=0.25 SENSITIVE Sensitive     GENTAMICIN <=0.5 SENSITIVE Sensitive     OXACILLIN <=0.25 SENSITIVE Sensitive     TETRACYCLINE <=1 SENSITIVE Sensitive     VANCOMYCIN <=0.5 SENSITIVE Sensitive     TRIMETH/SULFA <=10 SENSITIVE Sensitive     CLINDAMYCIN <=0.25 SENSITIVE Sensitive     RIFAMPIN <=0.5 SENSITIVE Sensitive     Inducible Clindamycin NEGATIVE Sensitive     * STAPHYLOCOCCUS HOMINIS  Culture, blood (routine x 2)     Status: Abnormal   Collection Time: 02/19/20 10:48 AM   Specimen: BLOOD  Result Value Ref Range Status   Specimen Description BLOOD RIGHT ANTECUBITAL  Final   Special Requests   Final    BOTTLES DRAWN AEROBIC AND ANAEROBIC Blood Culture adequate volume   Culture  Setup Time   Final    GRAM POSITIVE COCCI IN CLUSTERS IN BOTH AEROBIC AND ANAEROBIC BOTTLES CRITICAL RESULT CALLED TO, READ BACK BY AND VERIFIED WITH: PHRMD V BRYK _0  02/20/20 BY S GEZAHEGN Performed at [facility], [place] [address] [place], [place] [address]    Culture (A)  Final    STAPHYLOCOCCUS EPIDERMIDIS STAPHYLOCOCCUS HOMINIS    Report Status 02/23/2020 FINAL  Final   Organism ID, Bacteria STAPHYLOCOCCUS EPIDERMIDIS  Final   Organism ID, Bacteria STAPHYLOCOCCUS HOMINIS  Final      Susceptibility   Staphylococcus epidermidis - MIC*    CIPROFLOXACIN >=8 RESISTANT Resistant     ERYTHROMYCIN >=8 RESISTANT Resistant     GENTAMICIN <=0.5 SENSITIVE Sensitive     OXACILLIN >=4 RESISTANT Resistant     TETRACYCLINE <=1 SENSITIVE Sensitive     VANCOMYCIN 1 SENSITIVE Sensitive     TRIMETH/SULFA 160 RESISTANT Resistant     CLINDAMYCIN RESISTANT Resistant     RIFAMPIN <=0.5 SENSITIVE Sensitive     Inducible Clindamycin POSITIVE  Resistant     * STAPHYLOCOCCUS EPIDERMIDIS   Staphylococcus hominis - MIC*    CIPROFLOXACIN 4 RESISTANT Resistant     ERYTHROMYCIN >=8 RESISTANT Resistant     GENTAMICIN <=0.5 SENSITIVE Sensitive     OXACILLIN >=4 RESISTANT Resistant     TETRACYCLINE <=1 SENSITIVE Sensitive     VANCOMYCIN 1 SENSITIVE Sensitive     TRIMETH/SULFA 160 RESISTANT Resistant     CLINDAMYCIN RESISTANT Resistant     RIFAMPIN <=0.5 SENSITIVE Sensitive     Inducible Clindamycin POSITIVE Resistant     * STAPHYLOCOCCUS HOMINIS  Blood Culture ID Panel (Reflexed)     Status: Abnormal   Collection Time: 02/19/20 10:48 AM  Result Value Ref Range Status   Enterococcus species NOT DETECTED NOT DETECTED Final   Listeria monocytogenes NOT DETECTED NOT DETECTED Final   Staphylococcus species DETECTED (A) NOT DETECTED Final    Comment: Methicillin (oxacillin) resistant coagulase negative staphylococcus. Possible blood culture contaminant (unless isolated from more than one blood culture draw or clinical case suggests pathogenicity). No antibiotic treatment is indicated for blood  culture contaminants. CRITICAL RESULT CALLED TO, READ BACK BY AND VERIFIED WITH: PHRMD V BRYK _1  02/20/20 BY S GEZAHEGN    Staphylococcus aureus (BCID) NOT DETECTED NOT DETECTED Final   Methicillin resistance DETECTED (A) NOT DETECTED Final    Comment: CRITICAL RESULT CALLED TO, READ BACK BY AND VERIFIED WITH: PHRMD V BRYK _2  02/20/20 BY S GEZAHEGN    Streptococcus species NOT DETECTED NOT DETECTED Final   Streptococcus agalactiae NOT  DETECTED NOT DETECTED Final   Streptococcus pneumoniae NOT DETECTED NOT DETECTED Final   Streptococcus pyogenes NOT DETECTED NOT DETECTED Final   Acinetobacter baumannii NOT DETECTED NOT DETECTED Final   Enterobacteriaceae species NOT DETECTED NOT DETECTED Final   Enterobacter cloacae complex NOT DETECTED NOT DETECTED Final   Escherichia coli NOT DETECTED NOT DETECTED Final   Klebsiella oxytoca NOT DETECTED  NOT DETECTED Final   Klebsiella pneumoniae NOT DETECTED NOT DETECTED Final   Proteus species NOT DETECTED NOT DETECTED Final   Serratia marcescens NOT DETECTED NOT DETECTED Final   Haemophilus influenzae NOT DETECTED NOT DETECTED Final   Neisseria meningitidis NOT DETECTED NOT DETECTED Final   Pseudomonas aeruginosa NOT DETECTED NOT DETECTED Final   Candida albicans NOT DETECTED NOT DETECTED Final   Candida glabrata NOT DETECTED NOT DETECTED Final   Candida krusei NOT DETECTED NOT DETECTED Final   Candida parapsilosis NOT DETECTED NOT DETECTED Final   Candida tropicalis NOT DETECTED NOT DETECTED Final    Comment: Performed at [facility], [place] [address] [place], [place] [address]  MRSA PCR Screening     Status: None   Collection Time: 02/19/20  8:59 PM   Specimen: Nasopharyngeal  Result Value Ref Range Status   MRSA by PCR NEGATIVE NEGATIVE Final    Comment:        The GeneXpert MRSA Assay (FDA approved for NASAL specimens only), is one component of a comprehensive MRSA colonization surveillance program. It is not intended to diagnose MRSA infection nor to guide or monitor treatment for MRSA infections. Performed at [facility], [place] [address] [place], [place] [address]   Culture, blood (Routine X 2) w Reflex to ID Panel     Status: None   Collection Time: 02/20/20 10:43 AM   Specimen: BLOOD LEFT ARM  Result Value Ref Range Status   Specimen Description BLOOD LEFT ARM  Final   Special Requests   Final    BOTTLES DRAWN AEROBIC ONLY Blood Culture adequate volume   Culture   Final    NO GROWTH 5 DAYS Performed at [facility], [place] [address], [place], [place] [address]    Report Status 02/25/2020 FINAL  Final  Culture, blood (Routine X 2) w Reflex to ID Panel     Status: None   Collection Time: 02/20/20 10:48 AM   Specimen: BLOOD LEFT WRIST  Result Value Ref Range Status   Specimen Description BLOOD LEFT WRIST  Final   Special Requests   Final     BOTTLES DRAWN AEROBIC ONLY Blood Culture results may not be optimal due to an inadequate volume of blood received in culture bottles   Culture   Final    NO GROWTH 5 DAYS Performed at [facility], [place] [address] [place], [place] [address]    Report Status 02/25/2020 FINAL  Final      Radiology Studies: No results found.   Scheduled Meds: . acetaminophen  650 mg Oral Q8H  . acidophilus  1 capsule Oral Daily  . ambrisentan  10 mg Oral Daily  . amiodarone  200 mg Oral Daily  . apixaban  5 mg Oral BID  . aspirin EC  81 mg Oral Daily  . colchicine  0.3 mg Oral Daily  . [START ON 02/27/2020] cosyntropin  0.25 mg Intravenous Once  . fluticasone  2 spray Each Nare Daily  . umeclidinium bromide  2 puff Inhalation Daily   And  . fluticasone furoate-vilanterol  2 puff Inhalation Daily  . furosemide  40 mg Oral Daily  . guaiFENesin  600 mg Oral BID  . lidocaine  1 patch Transdermal Q24H  . nystatin  5 mL Oral QID  . pantoprazole  40 mg Oral Q1200  . senna  1 tablet Oral Daily  . tadalafil (PAH)  40 mg Oral Daily   Continuous Infusions:   LOS: 7 days    Donnamae Jude, MD 02/26/2020 5:07 PM [phone] [facility] If 7PM-7AM, please contact night-coverage 02/26/2020, 5:07 PM

## 2020-02-26 NOTE — Progress Notes (Signed)
Provided patient education on incentive spirometer and flutter valve. Patient verbalized understanding and demonstrated correct use. Patient encouraged to use devices every hour.

## 2020-02-26 NOTE — Progress Notes (Signed)
Daily Progress Note   Patient Name: Linda Stevens       Date: 02/26/2020 DOB: [birth year]  Age: 74 y.o. MRN#: [id number] Attending Physician: Donnamae Jude, MD Primary Care Physician: [name], MD Admit Date: 02/19/2020  Reason for Consultation/Follow-up: Establishing goals of care  Subjective: Patient resting in bed. Does complain of some abdominal discomfort. RN student recently administered pain medication. Patient reports she is drinking and eating a little more. Daughter and husband at the bedside. Daughter sharing updates on how patient has been doing and feeling over the past several days.   Patient shares that she sat up in the chair for about 2 hours and this really exhausted her and caused some discomfort. Family shares prior to admission however, she was active and ambulatory.   Patient initiates discussion requesting updates and inquiring about how she is doing. I discussed at openly and at length concerns regarding patient's continued hypotension, CHF, and respiratory concerns. Education provided on challenges with medical treatment and interventions including risk for sudden cardiopulmonary arrest. Patient and family verbalized understanding.   Patient and family expressed hopes of stability and some improvement. She is hopeful that she will regain some strength and states she will take it day by day.   We discussed getting out of bed if tolerable and appropriate throughout the day with nursing support with understanding of generalized weakness. Mrs. Vonbehren verbalized understanding sharing she is extremely weak and cannot perform many activities as she could prior to admission. Support given and discussion of trajectory continued.   All questions and concerns answered. Support given.   Length of Stay: 7 days  Vital Signs: BP (!) 109/42 (BP Location: Left Arm)   Pulse (!) 58   Temp 97.6 F (36.4 C) (Oral)   Resp 16   Ht 5' 3" (1.6 m)   Wt 65.9 kg   SpO2 97%    BMI 25.74 kg/m  SpO2: SpO2: 97 % O2 Device: O2 Device: High Flow Nasal Cannula O2 Flow Rate: O2 Flow Rate (L/min): 4 L/min  Intake/output summary:   Intake/Output Summary (Last 24 hours) at 02/26/2020 1048 Last data filed at 02/26/2020 0600 Gross per 24 hour  Intake 680 ml  Output 450 ml  Net 230 ml   LBM:   Baseline Weight: Weight: 61.2 kg Most recent weight: Weight: 65.9 kg  Physical Exam: -NAD, generalized weakness, frail appearing -normal breathing patter on 4L/[place] -hypotensive          Palliative Care Assessment & Plan    Code Status:  Full code   Goals of Care:  Full Code  Continue with current plan of care per medical team  Patient and family continues to remain hopeful for stability/improvement.   PMT will continue to support and follow. Continued ongoing discussions and encouraged patient and family to continue discussions.   Prognosis: Guarded   Discharge Planning: To Be Determined  Thank you for allowing the Palliative Medicine Team to assist in the care of this patient.  Time Total: 45 min.   Visit consisted of counseling and education dealing with the complex and emotionally intense issues of symptom management and palliative care in the setting of serious and potentially life-threatening illness.Greater than 50%  of this time was spent counseling and coordinating care related to the above assessment and plan.  Alda Lea, AGPCNP-BC  Palliative Medicine Team [phone]

## 2020-02-27 LAB — ACTH STIMULATION, 3 TIME POINTS
Cortisol, 30 Min: 8 ug/dL
Cortisol, 60 Min: 8.9 ug/dL
Cortisol, Base: 3 ug/dL

## 2020-02-27 MED ORDER — PREDNISONE 20 MG PO TABS
40.0000 mg | ORAL_TABLET | Freq: Every day | ORAL | Status: DC
  Administered 2020-02-27: 40 mg via ORAL
  Filled 2020-02-27: qty 2

## 2020-02-27 MED ORDER — PREDNISONE 5 MG PO TABS: 5.0000 mg | ORAL_TABLET | Freq: Every day | ORAL | Status: DC

## 2020-02-27 MED ORDER — PREDNISONE 20 MG PO TABS
20.0000 mg | ORAL_TABLET | Freq: Every day | ORAL | Status: DC
  Administered 2020-03-08 – 2020-03-10 (×3): 20 mg via ORAL
  Filled 2020-02-27 (×3): qty 1

## 2020-02-27 MED ORDER — PREDNISONE 20 MG PO TABS
40.0000 mg | ORAL_TABLET | Freq: Every day | ORAL | Status: AC
  Administered 2020-02-28 – 2020-03-02 (×4): 40 mg via ORAL
  Filled 2020-02-27 (×4): qty 2

## 2020-02-27 MED ORDER — DIGOXIN 0.25 MG/ML IJ SOLN
0.2500 mg | Freq: Every day | INTRAMUSCULAR | Status: AC
  Administered 2020-02-27: 0.25 mg via INTRAVENOUS
  Filled 2020-02-27: qty 2

## 2020-02-27 MED ORDER — PREDNISONE 10 MG PO TABS: 10.0000 mg | ORAL_TABLET | Freq: Every day | ORAL | Status: DC

## 2020-02-27 MED ORDER — PREDNISONE 20 MG PO TABS: 40.0000 mg | ORAL_TABLET | Freq: Every day | ORAL | Status: DC

## 2020-02-27 MED ORDER — COLCHICINE 0.6 MG PO TABS
0.6000 mg | ORAL_TABLET | Freq: Two times a day (BID) | ORAL | Status: DC
  Administered 2020-02-27 – 2020-02-28 (×2): 0.6 mg via ORAL
  Filled 2020-02-27 (×2): qty 1

## 2020-02-27 MED ORDER — PREDNISONE 20 MG PO TABS
30.0000 mg | ORAL_TABLET | Freq: Every day | ORAL | Status: AC
  Administered 2020-03-03 – 2020-03-07 (×5): 30 mg via ORAL
  Filled 2020-02-27 (×5): qty 1

## 2020-02-27 NOTE — Progress Notes (Signed)
RN in room and patient heart rate became elevated (pt resting in bed, had used bed pan about ten minutes prior).  Heart rate ranging 120's-150's.  EKG stated atrial flutter.  Blood pressure 99/63.  Per patient can feel heart beating fast.  [facility] p[facility]

## 2020-02-27 NOTE — Progress Notes (Signed)
Patient ID: Linda Stevens, female   DOB: [birth year], 74 y.o.   MRN: [id number]  PROGRESS NOTE    Linda Stevens  GYI:[id number] DOB: Jun 14, 1946 DOA: 02/19/2020 PCP: [name], MD    Brief Narrative:  74 year old female with history of COPD/chronic RF on 3 L, diastolic CHF, pulmonary HTN, HTN and HLD presenting with progressive shortness of breath for 1 week. Tried with a course of Z-Pak and a steroid taper by her pulmonologist on 6/9 but continues to have progressive shortness of breath.  In ED, slightly hypotensive. Required 6 L by . WBC 11.4. AST 90. Total bili 1.9. Lactic acid 2.8. BNP 164. Trop negative x2. CXR concerning for cardiomegaly with small pleural effusion and by basilar atelectasis. CTA chest showed sizable pericardial effusion, bilateral pleural effusions and consolidation concerning for pneumonia and pulmonary HTN. Cultures drawn. Received IV Solu-Medrol, morphine, Rocephin and azithromycin,and admitted for acute on chronic respiratory failure due to pneumonia, pleural effusions and pericardial effusion. Cardiology consulted for pericardial effusion. Echo obtained and revealed dilated and fixed IVC although no other features of tamponade.  The next day, blood cultures with GPC's in clusters in 4/4. BICD with coag negative MR staph. Vancomycin added. ID consulted. Azithromycin and ceftriaxone discontinued. Repeat blood culture obtained and NGTD. Eventually, vancomycin discontinued as susceptibilities on initial blood culture were different suggesting contamination versus true bacteremia.  Patient went into A. fib with RVR with hypotension on 6/14. Started on amiodarone drip and converted to sinus rhythm. Advanced heart failure team and palliative medicine involved as well.   Assessment & Plan:   Principal Problem:   Positive blood cultures Active Problems:   Goals of care, counseling/discussion   Congestive heart failure (CHF) (HCC)    Pulmonary artery hypertension (HCC)   Acute on chronic diastolic congestive heart failure (HCC)   Acute on chronic respiratory failure with hypoxia (HCC)   Pericardial effusion   Acute respiratory failure with hypoxia and hypercapnea   Hepatic cirrhosis (HCC)   Pleural effusion, bilateral   Community acquired pneumonia   New onset atrial fibrillation ([place])   Palliative care by specialist  Acute metabolic encephalopathy: Oriented to self, place, person and month. Slow to respond to questions. She has hypercarbia to 79 but well compensated. Ammonia slightly elevated to 52>34. TSH and B12 within normal. Morphine, tramadol and Phenergan could contribute. -Lactulose discontinued after improvement in her ammonia -Order scheduled Tylenol.Weaned off opiate and promethazine -CT head and intermittent BiPAP if no improvement -Reorientation and delirium precautions.  Abnormal blood culture-deemed to be contaminant. Repeat blood culture on 6/14 NGTD. TTE without vegetation. Leukocytosis resolved. -Appreciate ID guidance-recommended discontinue vancomycin and signed off. -Continue vancomycin 6/14> 6/17 -Ceftriaxone and azithromycin discontinued.   Community-acquired pneumonia? CTA concerning for bibasilar consolidation with pleural effusion butdifficult diagnosisgiven other confounding factors. -Received vancomycin, ceftriaxone and azithromycin as above. -Off antibiotics now per ID recommendation -Incentive spirometry and flutter valve  Acute on chronic respiratory failure with hypoxia: Multifactorial including pericardiac and pleural effusion, pulmonary hypertension, underlying COPD, possible pneumonia and A. fib with RVR. On 3 L at baseline. ABG suggestive for well compensated chronic respiratory acidosis. -Wean oxygen as able-minimum oxygen to keep saturation above 90%! On 4L now -Treat potential etiologies -Discussed with Cardiology today and they are unclear about improvement but  hopeful she will return to baseline.   Pericardial effusion:Improved. No tamponade.Repeat echo with minimal effusion but possible inflammatory pericarditis. ESR 63. Rheumatologic exam negative so far. -Cardiology managing-on colchicine and steroid--increased dose for 3  week taper.  Pleural effusion: Looks a small on review of CTA chest -Continue antibiotics as above  Pulmonary hypertension: PAPP 87 mmHg. RVSP 50.8 on limited echo on 6/18. -Advanced heart failure team following. -On sildenafil and ambrisentan  A. fib with RVR: Noted to have A. fib on EKG obtained by EMS. Went into RVR on 6/14 and 6/15. Converted to sinus rhythm with amiodarone drip. -On p.o. amiodaroneand Eliquis per cardiology. -Optimize Mg and K.  Chronic diastolic CHF: Echo with preserved EF and G1 DD. BNP lower than baseline. Reports good urine output although not captured. Creatinine improving. I&O incomplete. Soft blood pressures. -Per cardiology-on p.o. Lasix now -Monitor fluid status, renal function and electrolytes -Defer diuretics to cardiology.  AKI/azotemia: Baseline Cr 0.8-0.9> 1.01 (admit)> > 1.49> 1.33>1.19>1.09. BUN 43>54>58>42>33.AKI likely hemodynamically mediated. -Continue monitoring -Avoid nephrotoxic meds  Metabolic alkalosis:Concerning.Likelya combination of contraction alkalosis and compensation from hypercapnia -Recommended minimum oxygen to keep saturation above 90% -Could benefit from Diamox but soft blood pressures.  Hyperphosphatemia: Likely due to renal failure. Resolved. -Continue monitoring  Hypermagnesemia: Improving, continue to trend  Hypotension: Soft blood pressures. Just on p.o. Lasix -Continue monitoring  Elevated liver enzymes/hyperbilirubinemia: Likely congestive hepatopathy. Improved. Liver cirrhosis-due to pulmonary hypertension/heart failure? -Continue monitoring.  Normocytic anemia: Baseline Hgb 11-12>12.0 (admit)>>>9.7.  Suspect hemodilution -Continue monitoring  Acute on chronic COPD -Continue Trelegy Ellipta and nebulizers -On steroid for pericardial effusion  Left chest wall pain-TTP -Add Lidoderm patch.  Debility/physical deconditioning -PT/OT  ? Adrenal insufficiency Stress dose steroids  Goal of care-significant cardiopulmonary comorbidities as above. Prognosis is not great. She is a still full code. This would probably pose more harm than benefit. -Appreciate input by palliative care  Microbiology summarized: COVID-19 PCR negative. MRSA PCR negative. Blood cultures with GPC's in clusters in 4/4. BICD with coag negative methicillin-resistant staph with different susceptibilities deemed to be contaminant.  DVT prophylaxis: [id number] Code Status: Full code  Family Communication: Daughter and Spouse at bedside, daughter on the phone Disposition Plan: SNF vs. IP rehab vs. home   Consultants:   Cardiology  Infectious disease  Palliative care  Procedures:  None   Antimicrobials: Anti-infectives (From admission, onward)   Start     Dose/Rate Route Frequency Ordered Stop   02/21/20 0600  vancomycin (VANCOCIN) IVPB 1000 mg/200 mL premix  Status:  Discontinued        1,000 mg 200 mL/hr over 60 Minutes Intravenous Every 24 hours 02/20/20 0621 02/23/20 1223   02/20/20 1000  cefTRIAXone (ROCEPHIN) 2 g in sodium chloride 0.9 % 100 mL IVPB  Status:  Discontinued        2 g 200 mL/hr over 30 Minutes Intravenous Every 24 hours 02/19/20 1342 02/20/20 0616   02/20/20 1000  azithromycin (ZITHROMAX) 500 mg in sodium chloride 0.9 % 250 mL IVPB  Status:  Discontinued        500 mg 250 mL/hr over 60 Minutes Intravenous Every 24 hours 02/19/20 1342 02/20/20 1019   02/20/20 1000  cefTRIAXone (ROCEPHIN) 2 g in sodium chloride 0.9 % 100 mL IVPB  Status:  Discontinued        2 g 200 mL/hr over 30 Minutes Intravenous Every 24 hours 02/20/20 0913 02/21/20 0749   02/20/20 0645  vancomycin  (VANCOREADY) IVPB 1250 mg/250 mL        1,250 mg 166.7 mL/hr over 90 Minutes Intravenous  Once 02/20/20 0621 02/21/20 1649   02/19/20 1015  cefTRIAXone (ROCEPHIN) 1 g in sodium chloride 0.9 % 100 mL IVPB  1 g 200 mL/hr over 30 Minutes Intravenous  Once 02/19/20 1008 02/19/20 1151   02/19/20 1015  azithromycin (ZITHROMAX) 500 mg in sodium chloride 0.9 % 250 mL IVPB        500 mg 250 mL/hr over 60 Minutes Intravenous  Once 02/19/20 1008 02/19/20 1324       Subjective: Feels some better. Still with chest pain. She is wanting to know what recovery looks like.  Objective: Vitals:   02/27/20 0818 02/27/20 0829 02/27/20 1225 02/27/20 1655  BP:  (!) 110/47 (!) 108/50 (!) 99/49  Pulse:  (!) 52 63 66  Resp:  _0 Temp:  97.9 F (36.6 C) 98 F (36.7 C) 98.1 F (36.7 C)  TempSrc:  Oral Oral Oral  SpO2: 97% 99% 100% 92%  Weight:      Height:        Intake/Output Summary (Last 24 hours) at 02/27/2020 1725 Last data filed at 02/27/2020 1200 Gross per 24 hour  Intake 360 ml  Output --  Net 360 ml   Filed Weights   02/25/20 0357 02/26/20 0430 02/27/20 0331  Weight: 65.1 kg 65.9 kg 65.7 kg    Examination:  General exam: Appears calm and comfortable  Respiratory system: Clear to auscultation. Respiratory effort normal. Cardiovascular system: S1 & S2 heard, RRR. + JVD Gastrointestinal system: Abdomen is nondistended, soft and nontender.  Central nervous system: Alert and oriented. No focal neurological deficits. Extremities: Symmetric  Skin: No rashes Psychiatry: Judgement and insight appear normal. Mood & affect appropriate.     Data Reviewed: I have personally reviewed following labs and imaging studies  CBC: Recent Labs  Lab 02/21/20 0227 02/22/20 0259 02/23/20 0339 02/25/20 0405 02/26/20 0718  WBC 15.8* 11.2* 9.8 10.0 11.6*  NEUTROABS  --   --   --  8.6*  --   HGB 10.1* 9.7* 9.8* 9.2* 10.1*  HCT 32.2* 30.9* 31.5* 30.2* 32.3*  MCV 100.0 99.4 101.0*  100.7* 100.0  PLT 250 234 232 193 711   Basic Metabolic Panel: Recent Labs  Lab 02/21/20 0227 02/21/20 0227 02/22/20 0259 02/23/20 0339 02/24/20 0433 02/25/20 0405 02/26/20 0718  NA 140   < > 139 140 142 145 142  K 3.8   < > 3.6 4.0 3.6 3.5 4.4  CL 89*   < > 90* 90* 91* 93* 92*  CO2 38*   < > 38* 40* 41* 43* 41*  GLUCOSE 129*   < > 129* 119* 108* 102* 111*  BUN 43*   < > 54* 58* 55* 42* 33*  CREATININE 1.49*   < > 1.39* 1.35* 1.32* 1.19* 1.07*  CALCIUM 8.6*   < > 8.5* 8.6* 8.5* 8.3* 8.2*  MG 2.5*   < > 2.7* 3.2* 3.0* 2.9* 2.6*  PHOS 5.4*  --  4.8* 4.4 3.7 2.5  --    < > = values in this interval not displayed.   GFR: Estimated Creatinine Clearance: 43.9 mL/min (A) (by C-G formula based on SCr of 1.07 mg/dL (H)). Liver Function Tests: Recent Labs  Lab 02/22/20 0259 02/23/20 0339 02/24/20 0433 02/25/20 0405 02/26/20 0718  AST  --  26  --   --  13*  ALT  --  47*  --   --  27  ALKPHOS  --  92  --   --  78  BILITOT  --  0.8  --   --  0.6  PROT  --  5.8*  --   --  5.5*  ALBUMIN 2.7* 2.7*  2.7* 2.6* 2.6* 2.8*   No results for input(s): LIPASE, AMYLASE in the last 168 hours. Recent Labs  Lab 02/23/20 1040 02/24/20 0433  AMMONIA 52* 34     Recent Results (from the past 240 hour(s))  SARS Coronavirus 2 by RT PCR (hospital order, performed in [facility] hospital lab) Nasopharyngeal Nasopharyngeal Swab     Status: None   Collection Time: 02/19/20  5:27 AM   Specimen: Nasopharyngeal Swab  Result Value Ref Range Status   SARS Coronavirus 2 NEGATIVE NEGATIVE Final    Comment: (NOTE) SARS-CoV-2 target nucleic acids are NOT DETECTED.  The SARS-CoV-2 RNA is generally detectable in upper and lower respiratory specimens during the acute phase of infection. The lowest concentration of SARS-CoV-2 viral copies this assay can detect is 250 copies / mL. A negative result does not preclude SARS-CoV-2 infection and should not be used as the sole basis for treatment or  other patient management decisions.  A negative result may occur with improper specimen collection / handling, submission of specimen other than nasopharyngeal swab, presence of viral mutation(s) within the areas targeted by this assay, and inadequate number of viral copies (<250 copies / mL). A negative result must be combined with clinical observations, patient history, and epidemiological information.  Fact Sheet for Patients:   StrictlyIdeas.no  Fact Sheet for Healthcare Providers: BankingDealers.co.za  This test is not yet approved or  cleared by the [place] FDA and has been authorized for detection and/or diagnosis of SARS-CoV-2 by FDA under an Emergency Use Authorization (EUA).  This EUA will remain in effect (meaning this test can be used) for the duration of the COVID-19 declaration under Section 564(b)(1) of the Act, 21 U.S.C. section 360bbb-3(b)(1), unless the authorization is terminated or revoked sooner.  Performed at [facility], [place] [address] [place], [place] [address]   Culture, blood (routine x 2)     Status: Abnormal   Collection Time: 02/19/20 10:36 AM   Specimen: BLOOD RIGHT HAND  Result Value Ref Range Status   Specimen Description BLOOD RIGHT HAND  Final   Special Requests   Final    BOTTLES DRAWN AEROBIC AND ANAEROBIC Blood Culture adequate volume   Culture  Setup Time   Final    GRAM POSITIVE COCCI IN CLUSTERS IN BOTH AEROBIC AND ANAEROBIC BOTTLES CRITICAL VALUE NOTED.  VALUE IS CONSISTENT WITH PREVIOUSLY REPORTED AND CALLED VALUE. Performed at [facility], [place] [address] [place], [place] [address]    Culture (A)  Final    STAPHYLOCOCCUS EPIDERMIDIS STAPHYLOCOCCUS HOMINIS    Report Status 02/23/2020 FINAL  Final   Organism ID, Bacteria STAPHYLOCOCCUS EPIDERMIDIS  Final   Organism ID, Bacteria STAPHYLOCOCCUS HOMINIS  Final      Susceptibility   Staphylococcus epidermidis - MIC*     CIPROFLOXACIN 4 RESISTANT Resistant     ERYTHROMYCIN >=8 RESISTANT Resistant     GENTAMICIN <=0.5 SENSITIVE Sensitive     OXACILLIN >=4 RESISTANT Resistant     TETRACYCLINE >=16 RESISTANT Resistant     VANCOMYCIN 1 SENSITIVE Sensitive     TRIMETH/SULFA 160 RESISTANT Resistant     CLINDAMYCIN RESISTANT Resistant     RIFAMPIN <=0.5 SENSITIVE Sensitive     Inducible Clindamycin POSITIVE Resistant     * STAPHYLOCOCCUS EPIDERMIDIS   Staphylococcus hominis - MIC*    CIPROFLOXACIN <=0.5 SENSITIVE Sensitive     ERYTHROMYCIN <=0.25 SENSITIVE Sensitive     GENTAMICIN <=0.5 SENSITIVE Sensitive  OXACILLIN <=0.25 SENSITIVE Sensitive     TETRACYCLINE <=1 SENSITIVE Sensitive     VANCOMYCIN <=0.5 SENSITIVE Sensitive     TRIMETH/SULFA <=10 SENSITIVE Sensitive     CLINDAMYCIN <=0.25 SENSITIVE Sensitive     RIFAMPIN <=0.5 SENSITIVE Sensitive     Inducible Clindamycin NEGATIVE Sensitive     * STAPHYLOCOCCUS HOMINIS  Culture, blood (routine x 2)     Status: Abnormal   Collection Time: 02/19/20 10:48 AM   Specimen: BLOOD  Result Value Ref Range Status   Specimen Description BLOOD RIGHT ANTECUBITAL  Final   Special Requests   Final    BOTTLES DRAWN AEROBIC AND ANAEROBIC Blood Culture adequate volume   Culture  Setup Time   Final    GRAM POSITIVE COCCI IN CLUSTERS IN BOTH AEROBIC AND ANAEROBIC BOTTLES CRITICAL RESULT CALLED TO, READ BACK BY AND VERIFIED WITH: PHRMD V BRYK _0  02/20/20 BY S GEZAHEGN Performed at [facility], [place] [address] [place], [place] [address]    Culture (A)  Final    STAPHYLOCOCCUS EPIDERMIDIS STAPHYLOCOCCUS HOMINIS    Report Status 02/23/2020 FINAL  Final   Organism ID, Bacteria STAPHYLOCOCCUS EPIDERMIDIS  Final   Organism ID, Bacteria STAPHYLOCOCCUS HOMINIS  Final      Susceptibility   Staphylococcus epidermidis - MIC*    CIPROFLOXACIN >=8 RESISTANT Resistant     ERYTHROMYCIN >=8 RESISTANT Resistant     GENTAMICIN <=0.5 SENSITIVE Sensitive     OXACILLIN  >=4 RESISTANT Resistant     TETRACYCLINE <=1 SENSITIVE Sensitive     VANCOMYCIN 1 SENSITIVE Sensitive     TRIMETH/SULFA 160 RESISTANT Resistant     CLINDAMYCIN RESISTANT Resistant     RIFAMPIN <=0.5 SENSITIVE Sensitive     Inducible Clindamycin POSITIVE Resistant     * STAPHYLOCOCCUS EPIDERMIDIS   Staphylococcus hominis - MIC*    CIPROFLOXACIN 4 RESISTANT Resistant     ERYTHROMYCIN >=8 RESISTANT Resistant     GENTAMICIN <=0.5 SENSITIVE Sensitive     OXACILLIN >=4 RESISTANT Resistant     TETRACYCLINE <=1 SENSITIVE Sensitive     VANCOMYCIN 1 SENSITIVE Sensitive     TRIMETH/SULFA 160 RESISTANT Resistant     CLINDAMYCIN RESISTANT Resistant     RIFAMPIN <=0.5 SENSITIVE Sensitive     Inducible Clindamycin POSITIVE Resistant     * STAPHYLOCOCCUS HOMINIS  Blood Culture ID Panel (Reflexed)     Status: Abnormal   Collection Time: 02/19/20 10:48 AM  Result Value Ref Range Status   Enterococcus species NOT DETECTED NOT DETECTED Final   Listeria monocytogenes NOT DETECTED NOT DETECTED Final   Staphylococcus species DETECTED (A) NOT DETECTED Final    Comment: Methicillin (oxacillin) resistant coagulase negative staphylococcus. Possible blood culture contaminant (unless isolated from more than one blood culture draw or clinical case suggests pathogenicity). No antibiotic treatment is indicated for blood  culture contaminants. CRITICAL RESULT CALLED TO, READ BACK BY AND VERIFIED WITH: PHRMD V BRYK _1  02/20/20 BY S GEZAHEGN    Staphylococcus aureus (BCID) NOT DETECTED NOT DETECTED Final   Methicillin resistance DETECTED (A) NOT DETECTED Final    Comment: CRITICAL RESULT CALLED TO, READ BACK BY AND VERIFIED WITH: PHRMD V BRYK _2  02/20/20 BY S GEZAHEGN    Streptococcus species NOT DETECTED NOT DETECTED Final   Streptococcus agalactiae NOT DETECTED NOT DETECTED Final   Streptococcus pneumoniae NOT DETECTED NOT DETECTED Final   Streptococcus pyogenes NOT DETECTED NOT DETECTED Final    Acinetobacter baumannii NOT DETECTED NOT DETECTED Final   Enterobacteriaceae  species NOT DETECTED NOT DETECTED Final   Enterobacter cloacae complex NOT DETECTED NOT DETECTED Final   Escherichia coli NOT DETECTED NOT DETECTED Final   Klebsiella oxytoca NOT DETECTED NOT DETECTED Final   Klebsiella pneumoniae NOT DETECTED NOT DETECTED Final   Proteus species NOT DETECTED NOT DETECTED Final   Serratia marcescens NOT DETECTED NOT DETECTED Final   Haemophilus influenzae NOT DETECTED NOT DETECTED Final   Neisseria meningitidis NOT DETECTED NOT DETECTED Final   Pseudomonas aeruginosa NOT DETECTED NOT DETECTED Final   Candida albicans NOT DETECTED NOT DETECTED Final   Candida glabrata NOT DETECTED NOT DETECTED Final   Candida krusei NOT DETECTED NOT DETECTED Final   Candida parapsilosis NOT DETECTED NOT DETECTED Final   Candida tropicalis NOT DETECTED NOT DETECTED Final    Comment: Performed at [facility], [place] [address], [place], [place] [address]  MRSA PCR Screening     Status: None   Collection Time: 02/19/20  8:59 PM   Specimen: Nasopharyngeal  Result Value Ref Range Status   MRSA by PCR NEGATIVE NEGATIVE Final    Comment:        The GeneXpert MRSA Assay (FDA approved for NASAL specimens only), is one component of a comprehensive MRSA colonization surveillance program. It is not intended to diagnose MRSA infection nor to guide or monitor treatment for MRSA infections. Performed at [facility], [place] [address], [place], [place] [address]   Culture, blood (Routine X 2) w Reflex to ID Panel     Status: None   Collection Time: 02/20/20 10:43 AM   Specimen: BLOOD LEFT ARM  Result Value Ref Range Status   Specimen Description BLOOD LEFT ARM  Final   Special Requests   Final    BOTTLES DRAWN AEROBIC ONLY Blood Culture adequate volume   Culture   Final    NO GROWTH 5 DAYS Performed at [facility], [place] [address], [place], [place] [address]    Report Status  02/25/2020 FINAL  Final  Culture, blood (Routine X 2) w Reflex to ID Panel     Status: None   Collection Time: 02/20/20 10:48 AM   Specimen: BLOOD LEFT WRIST  Result Value Ref Range Status   Specimen Description BLOOD LEFT WRIST  Final   Special Requests   Final    BOTTLES DRAWN AEROBIC ONLY Blood Culture results may not be optimal due to an inadequate volume of blood received in culture bottles   Culture   Final    NO GROWTH 5 DAYS Performed at [facility], [place] [address], [place], [place] [address]    Report Status 02/25/2020 FINAL  Final      Radiology Studies: No results found.   Scheduled Meds: . acetaminophen  650 mg Oral Q8H  . acidophilus  1 capsule Oral Daily  . ambrisentan  10 mg Oral Daily  . amiodarone  200 mg Oral Daily  . apixaban  5 mg Oral BID  . aspirin EC  81 mg Oral Daily  . colchicine  0.6 mg Oral BID  . fluticasone  2 spray Each Nare Daily  . umeclidinium bromide  2 puff Inhalation Daily   And  . fluticasone furoate-vilanterol  2 puff Inhalation Daily  . furosemide  40 mg Oral Daily  . guaiFENesin  600 mg Oral BID  . lidocaine  1 patch Transdermal Q24H  . nystatin  5 mL Oral QID  . pantoprazole  40 mg Oral Q1200  . [  START ON 02/28/2020] predniSONE  40 mg Oral Q breakfast   Followed by  . [START ON 03/03/2020] predniSONE  30 mg Oral Q breakfast   Followed by  . [START ON 03/08/2020] predniSONE  20 mg Oral Q breakfast   Followed by  . [START ON 03/13/2020] predniSONE  10 mg Oral Q breakfast   Followed by  . [START ON 03/18/2020] predniSONE  5 mg Oral Q breakfast  . senna  1 tablet Oral Daily  . tadalafil (PAH)  40 mg Oral Daily   Continuous Infusions:   LOS: 8 days    Donnamae Jude, MD 02/27/2020 5:25 PM [phone] [facility] If 7PM-7AM, please contact night-coverage 02/27/2020, 5:25 PM

## 2020-02-28 ENCOUNTER — Inpatient Hospital Stay (HOSPITAL_COMMUNITY): Payer: Medicare HMO

## 2020-02-28 MED ORDER — COLCHICINE 0.3 MG HALF TABLET
0.3000 mg | ORAL_TABLET | Freq: Two times a day (BID) | ORAL | Status: DC
  Administered 2020-02-29 – 2020-03-10 (×22): 0.3 mg via ORAL
  Filled 2020-02-28 (×24): qty 1

## 2020-02-28 MED ORDER — IOHEXOL 300 MG/ML  SOLN
100.0000 mL | Freq: Once | INTRAMUSCULAR | Status: AC | PRN
  Administered 2020-02-28: 100 mL via INTRAVENOUS

## 2020-02-28 NOTE — Progress Notes (Signed)
Physical Therapy Treatment Patient Details Name: Linda Stevens MRN: [id number] DOB: 03-05-1946 Today's Date: 02/28/2020    History of Present Illness Pt adm with acute hypoxic respiratory failure and large pericardial effusion and PNA. PMH - diastolic heart failure, pulmonary htn, copd, htn    PT Comments    Pt supine in bed with daughter and husband present on entry. Pt reports being "on the bedpan all morning." Pt is obviously concerned with constant stooling. Pt is limited in safe mobility by anxiety and related oxygen desaturation (see General Comments) in presence of generalized weakness and decreased endurance. Pt is currently min A for bed mobility, transfers with HHA, and lateral stepping of 3 feet. Pt request not to advance ambulation due to fatigue and concern about stool incontinence. D/c plan remains appropriate at this time. PT will continue to follow acutely.     Follow Up Recommendations  Home health PT     Equipment Recommendations  Other (comment)       Precautions / Restrictions Precautions Precautions: Other (comment) Precaution Comments: watch SpO2 Restrictions Weight Bearing Restrictions: No    Mobility  Bed Mobility Overal bed mobility: Needs Assistance Bed Mobility: Supine to Sit     Supine to sit: Min assist     General bed mobility comments: min A for pulling against therapist to come to EoB, pt with increased abdominal pain with twisting so pulls to longsitting  Transfers Overall transfer level: Needs assistance Equipment used: 1 person hand held assist Transfers: Sit to/from Stand Sit to Stand: Min assist         General transfer comment: min A for power up and steadying, pt with immediate need to have BM, able to sit on bed pan at EoB, min A for standing for pericare  Ambulation/Gait Ambulation/Gait assistance: Min assist Gait Distance (Feet): 3 Feet Assistive device: 1 person hand held assist Gait Pattern/deviations: Step-to  pattern;Decreased step length - right;Decreased step length - left Gait velocity: decr Gait velocity interpretation: <1.31 ft/sec, indicative of household ambulator General Gait Details: minA for lateral step to pattern to come to EoB, pt reluctant to step away from bed due to stool incontinenct          Balance Overall balance assessment: Needs assistance Sitting-balance support: No upper extremity supported;Feet supported Sitting balance-Leahy Scale: Good Sitting balance - Comments: Once positioned, pt is able to maintain balance, but anxiety is limiting factor   Standing balance support: Bilateral upper extremity supported Standing balance-Leahy Scale: Poor Standing balance comment: requires light bilateral UE support for static standing for pericare                            Cognition Arousal/Alertness: Awake/alert Behavior During Therapy: Anxious Overall Cognitive Status: Within Functional Limits for tasks assessed                                 General Comments: pt continues to be anxious but is more willing to work with therapy today          General Comments General comments (skin integrity, edema, etc.): Pt on 4L O2 via [place] with SaO2 96%O2 in supine with coming to EoB pt with increased anxiety and SaO2 dropped to 82%O2, requiring maximal cuing for purse lipped breathing, pt unable to perform adequately, but when talking to her daughter SaO2 rebounded to 88%O2, with standing and stepping SaO2 >  87%O2, once back in bed SaO2 rebounded to 92%O2 almost immediately      Pertinent Vitals/Pain Pain Assessment: Faces Faces Pain Scale: Hurts a little bit Pain Location: Generalized (abdomen) Pain Descriptors / Indicators: Grimacing Pain Intervention(s): Limited activity within patient's tolerance;Monitored during session;Repositioned           PT Goals (current goals can now be found in the care plan section) Acute Rehab PT Goals Patient Stated  Goal: return home PT Goal Formulation: With patient Time For Goal Achievement: 03/07/20 Potential to Achieve Goals: Good Progress towards PT goals: Progressing toward goals    Frequency    Min 3X/week      PT Plan Current plan remains appropriate       AM-PAC PT "6 Clicks" Mobility   Outcome Measure  Help needed turning from your back to your side while in a flat bed without using bedrails?: None Help needed moving from lying on your back to sitting on the side of a flat bed without using bedrails?: None Help needed moving to and from a bed to a chair (including a wheelchair)?: A Little Help needed standing up from a chair using your arms (e.g., wheelchair or bedside chair)?: A Little Help needed to walk in hospital room?: A Little Help needed climbing 3-5 steps with a railing? : A Lot 6 Click Score: 19    End of Session Equipment Utilized During Treatment: Gait belt;Oxygen Activity Tolerance: Patient limited by fatigue;Other (comment) (dyspnea, and decreased O2 saturation ) Patient left: in chair;with call bell/phone within reach;with chair alarm set Nurse Communication: Mobility status PT Visit Diagnosis: Other abnormalities of gait and mobility (R26.89);Muscle weakness (generalized) (M62.81)     Time: 0518-3358 PT Time Calculation (min) (ACUTE ONLY): 18 min  Charges:  $Therapeutic Activity: 8-22 mins                     Arlanda Shiplett B. Migdalia Dk PT, DPT Acute Rehabilitation Services Pager [phone] Office [phone]    [place] 02/28/2020, 10:28 AM

## 2020-02-28 NOTE — Progress Notes (Signed)
Patient ID: Linda Stevens, female   DOB: [birth year], 74 y.o.   MRN: [id number]  PROGRESS NOTE    BEYZA BELLINO  ZDG:[id number] DOB: 01-03-1946 DOA: 02/19/2020 PCP: [name], MD    Brief Narrative:  74 year old female with history of COPD/chronic RF on 3 L, diastolic CHF, pulmonary HTN, HTN and HLD presenting with progressive shortness of breath for 1 week. Tried with a course of Z-Pak and a steroid taper by her pulmonologist on 6/9 but continues to have progressive shortness of breath.  In ED, slightly hypotensive. Required 6 L by [place]. WBC 11.4. AST 90. Total bili 1.9. Lactic acid 2.8. BNP 164. Trop negative x2. CXR concerning for cardiomegaly with small pleural effusion and by basilar atelectasis. CTA chest showed sizable pericardial effusion, bilateral pleural effusions and consolidation concerning for pneumonia and pulmonary HTN. Cultures drawn. Received IV Solu-Medrol, morphine, Rocephin and azithromycin,and admitted for acute on chronic respiratory failure due to pneumonia, pleural effusions and pericardial effusion. Cardiology consulted for pericardial effusion. Echo obtained and revealed dilated and fixed IVC although no other features of tamponade.  The next day, blood cultures with GPC's in clusters in 4/4. BICD with coag negative MR staph. Vancomycin added. ID consulted. Azithromycin and ceftriaxone discontinued. Repeat blood culture obtained and NGTD. Eventually, vancomycin discontinued as susceptibilities on initial blood culture were different suggesting contamination versus true bacteremia.  Patient went into A. fib with RVR with hypotension on 6/14. Started on amiodarone drip and converted to sinus rhythm. Advanced heart failure team and palliative medicine involved as well.   Assessment & Plan:   Principal Problem:   Positive blood cultures Active Problems:   Goals of care, counseling/discussion   Congestive heart failure (CHF) (HCC)    Pulmonary artery hypertension (HCC)   Acute on chronic diastolic congestive heart failure (HCC)   Acute on chronic respiratory failure with hypoxia (HCC)   Pericardial effusion   Acute respiratory failure with hypoxia and hypercapnea   Hepatic cirrhosis (HCC)   Pleural effusion, bilateral   Community acquired pneumonia   New onset atrial fibrillation ([place])   Palliative care by specialist  Pericardial effusion:Improved. No tamponade.Repeat echo with minimal effusion but possible inflammatory pericarditis. ESR 63. Rheumatologic exam negative so far. -Cardiology managing--see last note--on colchicine (had to decrease dose related to loose stools) and steroid--increased dose for 3 week taper.  Pleural effusion:Looks a small on review of CTA chest -Continue antibiotics as above  Pulmonary hypertension:PAPP 87 mmHg. RVSP 50.8 on limited echo on 6/18. -Advanced heart failure team following. -On sildenafil and ambrisentan  New abdominal pain, present prior to admission, minimal w/u Will check CT abd/pelvis - pending  Diarrhea - will decrease colchicine  A. fib with RVR: Noted to have A. fib on EKG obtained by EMS. Went into RVR on 6/14 and 6/15. Converted to sinus rhythm with amiodarone drip. -On p.o. amiodaroneand Eliquis per cardiology. -Optimize Mg and K.  Chronic diastolic CHF: Echo with preserved EF and G1 DD. BNP lower than baseline. Reports good urine output although not captured. Creatinine improving. I&O incomplete. Soft blood pressures. -Per cardiology-on p.o. Lasix now -Monitor fluid status, renal function and electrolytes -Defer diuretics to cardiology.  AKI/azotemia:Baseline Cr 0.8-0.9>1.01 (admit)>>1.49>1.33>1.19>1.09. BUN 43>54>58>42>33.AKI likely hemodynamically mediated. -Continue monitoring -Avoid nephrotoxic meds  Metabolic alkalosis:Concerning.Likelya combination of contraction alkalosis and compensation from  hypercapnia -Recommended minimum oxygen to keep saturation above 90% -Could benefit from Diamox but soft blood pressures.  Hyperphosphatemia:Likely due to renal failure. Resolved. -Continue monitoring  Hypermagnesemia: Improving,  continue to trend  Hypotension:Soft blood pressures. Just on p.o. Lasix -Continue monitoring  Elevated liver enzymes/hyperbilirubinemia: Likely congestive hepatopathy. Improved. Liver cirrhosis-due to pulmonary hypertension/heart failure? -Continue monitoring.  Normocytic anemia:Baseline Hgb 11-12>12.0 (admit)>>>9.7. Suspect hemodilution -Continue monitoring  Acute on chronic COPD -Continue Trelegy Ellipta and nebulizers -On steroid for pericardial effusion  Left chest wall pain-TTP -Add Lidoderm patch.  Debility/physical deconditioning -PT/OT  ? Adrenal insufficiency Stress dose steroids  Goal of care-significant cardiopulmonary comorbidities as above. Prognosis is not great. She is a still full code. This would probably pose more harm than benefit. -Appreciate input by palliative care   DVT prophylaxis: AT:[id number] Code Status: Full code  Family Communication: Daughter and spouse at bedside, daughter on the phone Disposition Plan: SNF vs. IP rehab vs. Home with PT/OT possibly in 1-2 more days--she is also deciding about palliative/Hospice care.   Consultants:   Cardiology  Infectious disease  Palliative care  Procedures:  none  Antimicrobials: Anti-infectives (From admission, onward)   Start     Dose/Rate Route Frequency Ordered Stop   02/21/20 0600  vancomycin (VANCOCIN) IVPB 1000 mg/200 mL premix  Status:  Discontinued        1,000 mg 200 mL/hr over 60 Minutes Intravenous Every 24 hours 02/20/20 0621 02/23/20 1223   02/20/20 1000  cefTRIAXone (ROCEPHIN) 2 g in sodium chloride 0.9 % 100 mL IVPB  Status:  Discontinued        2 g 200 mL/hr over 30 Minutes Intravenous Every 24 hours 02/19/20 1342 02/20/20  0616   02/20/20 1000  azithromycin (ZITHROMAX) 500 mg in sodium chloride 0.9 % 250 mL IVPB  Status:  Discontinued        500 mg 250 mL/hr over 60 Minutes Intravenous Every 24 hours 02/19/20 1342 02/20/20 1019   02/20/20 1000  cefTRIAXone (ROCEPHIN) 2 g in sodium chloride 0.9 % 100 mL IVPB  Status:  Discontinued        2 g 200 mL/hr over 30 Minutes Intravenous Every 24 hours 02/20/20 0913 02/21/20 0749   02/20/20 0645  vancomycin (VANCOREADY) IVPB 1250 mg/250 mL        1,250 mg 166.7 mL/hr over 90 Minutes Intravenous  Once 02/20/20 0621 02/21/20 1649   02/19/20 1015  cefTRIAXone (ROCEPHIN) 1 g in sodium chloride 0.9 % 100 mL IVPB        1 g 200 mL/hr over 30 Minutes Intravenous  Once 02/19/20 1008 02/19/20 1151   02/19/20 1015  azithromycin (ZITHROMAX) 500 mg in sodium chloride 0.9 % 250 mL IVPB        500 mg 250 mL/hr over 60 Minutes Intravenous  Once 02/19/20 1008 02/19/20 1324       Subjective: Feels better today. Got out of bed. Still with numerous loose stools, since coming into the hospital. Also with upper abdominal pain, L>R  Objective: Vitals:   02/28/20 0626 02/28/20 0735 02/28/20 0931 02/28/20 0933  BP: 108/65 (!) 112/54    Pulse: 69 69    Resp:      Temp:  97.6 F (36.4 C)    TempSrc:  Oral    SpO2: 97% 92% 97% 98%  Weight:      Height:        Intake/Output Summary (Last 24 hours) at 02/28/2020 1055 Last data filed at 02/28/2020 0832 Gross per 24 hour  Intake 702 ml  Output --  Net 702 ml   Filed Weights   02/26/20 0430 02/27/20 0331 02/28/20 0324  Weight: 65.9 kg 65.7  kg 64.9 kg    Examination:  General exam: Appears calm and comfortable, improved color today  Respiratory system: Clear to auscultation. Respiratory effort normal. Cardiovascular system: S1 & S2 heard, RRR.  Gastrointestinal system: Abdomen is nondistended, soft and nontender.  Central nervous system: Alert and oriented. No focal neurological deficits. Extremities: Symmetric  Skin: No  rashes Psychiatry: Judgement and insight appear normal. Mood & affect appropriate.    Data Reviewed: I have personally reviewed following labs and imaging studies  CBC: Recent Labs  Lab 02/22/20 0259 02/23/20 0339 02/25/20 0405 02/26/20 0718  WBC 11.2* 9.8 10.0 11.6*  NEUTROABS  --   --  8.6*  --   HGB 9.7* 9.8* 9.2* 10.1*  HCT 30.9* 31.5* 30.2* 32.3*  MCV 99.4 101.0* 100.7* 100.0  PLT 234 232 193 092   Basic Metabolic Panel: Recent Labs  Lab 02/22/20 0259 02/23/20 0339 02/24/20 0433 02/25/20 0405 02/26/20 0718  NA 139 140 142 145 142  K 3.6 4.0 3.6 3.5 4.4  CL 90* 90* 91* 93* 92*  CO2 38* 40* 41* 43* 41*  GLUCOSE 129* 119* 108* 102* 111*  BUN 54* 58* 55* 42* 33*  CREATININE 1.39* 1.35* 1.32* 1.19* 1.07*  CALCIUM 8.5* 8.6* 8.5* 8.3* 8.2*  MG 2.7* 3.2* 3.0* 2.9* 2.6*  PHOS 4.8* 4.4 3.7 2.5  --    GFR: Estimated Creatinine Clearance: 43.7 mL/min (A) (by C-G formula based on SCr of 1.07 mg/dL (H)). Liver Function Tests: Recent Labs  Lab 02/22/20 0259 02/23/20 0339 02/24/20 0433 02/25/20 0405 02/26/20 0718  AST  --  26  --   --  13*  ALT  --  47*  --   --  27  ALKPHOS  --  92  --   --  78  BILITOT  --  0.8  --   --  0.6  PROT  --  5.8*  --   --  5.5*  ALBUMIN 2.7* 2.7*  2.7* 2.6* 2.6* 2.8*    Recent Labs  Lab 02/23/20 1040 02/24/20 0433  AMMONIA 52* 34     Recent Results (from the past 240 hour(s))  SARS Coronavirus 2 by RT PCR (hospital order, performed in [facility] hospital lab) Nasopharyngeal Nasopharyngeal Swab     Status: None   Collection Time: 02/19/20  5:27 AM   Specimen: Nasopharyngeal Swab  Result Value Ref Range Status   SARS Coronavirus 2 NEGATIVE NEGATIVE Final    Comment: (NOTE) SARS-CoV-2 target nucleic acids are NOT DETECTED.  The SARS-CoV-2 RNA is generally detectable in upper and lower respiratory specimens during the acute phase of infection. The lowest concentration of SARS-CoV-2 viral copies this assay can detect is  250 copies / mL. A negative result does not preclude SARS-CoV-2 infection and should not be used as the sole basis for treatment or other patient management decisions.  A negative result may occur with improper specimen collection / handling, submission of specimen other than nasopharyngeal swab, presence of viral mutation(s) within the areas targeted by this assay, and inadequate number of viral copies (<250 copies / mL). A negative result must be combined with clinical observations, patient history, and epidemiological information.  Fact Sheet for Patients:   StrictlyIdeas.no  Fact Sheet for Healthcare Providers: BankingDealers.co.za  This test is not yet approved or  cleared by the [place] FDA and has been authorized for detection and/or diagnosis of SARS-CoV-2 by FDA under an Emergency Use Authorization (EUA).  This EUA will remain in effect (meaning  this test can be used) for the duration of the COVID-19 declaration under Section 564(b)(1) of the Act, 21 U.S.C. section 360bbb-3(b)(1), unless the authorization is terminated or revoked sooner.  Performed at [facility], [place] [address] [place], [place] [address]   Culture, blood (routine x 2)     Status: Abnormal   Collection Time: 02/19/20 10:36 AM   Specimen: BLOOD RIGHT HAND  Result Value Ref Range Status   Specimen Description BLOOD RIGHT HAND  Final   Special Requests   Final    BOTTLES DRAWN AEROBIC AND ANAEROBIC Blood Culture adequate volume   Culture  Setup Time   Final    GRAM POSITIVE COCCI IN CLUSTERS IN BOTH AEROBIC AND ANAEROBIC BOTTLES CRITICAL VALUE NOTED.  VALUE IS CONSISTENT WITH PREVIOUSLY REPORTED AND CALLED VALUE. Performed at [facility], [place] [address] [place], [place] [address]    Culture (A)  Final    STAPHYLOCOCCUS EPIDERMIDIS STAPHYLOCOCCUS HOMINIS    Report Status 02/23/2020 FINAL  Final   Organism ID, Bacteria STAPHYLOCOCCUS  EPIDERMIDIS  Final   Organism ID, Bacteria STAPHYLOCOCCUS HOMINIS  Final      Susceptibility   Staphylococcus epidermidis - MIC*    CIPROFLOXACIN 4 RESISTANT Resistant     ERYTHROMYCIN >=8 RESISTANT Resistant     GENTAMICIN <=0.5 SENSITIVE Sensitive     OXACILLIN >=4 RESISTANT Resistant     TETRACYCLINE >=16 RESISTANT Resistant     VANCOMYCIN 1 SENSITIVE Sensitive     TRIMETH/SULFA 160 RESISTANT Resistant     CLINDAMYCIN RESISTANT Resistant     RIFAMPIN <=0.5 SENSITIVE Sensitive     Inducible Clindamycin POSITIVE Resistant     * STAPHYLOCOCCUS EPIDERMIDIS   Staphylococcus hominis - MIC*    CIPROFLOXACIN <=0.5 SENSITIVE Sensitive     ERYTHROMYCIN <=0.25 SENSITIVE Sensitive     GENTAMICIN <=0.5 SENSITIVE Sensitive     OXACILLIN <=0.25 SENSITIVE Sensitive     TETRACYCLINE <=1 SENSITIVE Sensitive     VANCOMYCIN <=0.5 SENSITIVE Sensitive     TRIMETH/SULFA <=10 SENSITIVE Sensitive     CLINDAMYCIN <=0.25 SENSITIVE Sensitive     RIFAMPIN <=0.5 SENSITIVE Sensitive     Inducible Clindamycin NEGATIVE Sensitive     * STAPHYLOCOCCUS HOMINIS  Culture, blood (routine x 2)     Status: Abnormal   Collection Time: 02/19/20 10:48 AM   Specimen: BLOOD  Result Value Ref Range Status   Specimen Description BLOOD RIGHT ANTECUBITAL  Final   Special Requests   Final    BOTTLES DRAWN AEROBIC AND ANAEROBIC Blood Culture adequate volume   Culture  Setup Time   Final    GRAM POSITIVE COCCI IN CLUSTERS IN BOTH AEROBIC AND ANAEROBIC BOTTLES CRITICAL RESULT CALLED TO, READ BACK BY AND VERIFIED WITH: PHRMD V BRYK _0  02/20/20 BY S GEZAHEGN Performed at [facility], [address], [place],  [address]    Culture (A)  Final    STAPHYLOCOCCUS EPIDERMIDIS STAPHYLOCOCCUS HOMINIS    Report Status 02/23/2020 FINAL  Final   Organism ID, Bacteria STAPHYLOCOCCUS EPIDERMIDIS  Final   Organism ID, Bacteria STAPHYLOCOCCUS HOMINIS  Final      Susceptibility   Staphylococcus epidermidis - MIC*     CIPROFLOXACIN >=8 RESISTANT Resistant     ERYTHROMYCIN >=8 RESISTANT Resistant     GENTAMICIN <=0.5 SENSITIVE Sensitive     OXACILLIN >=4 RESISTANT Resistant     TETRACYCLINE <=1 SENSITIVE Sensitive     VANCOMYCIN 1 SENSITIVE Sensitive  TRIMETH/SULFA 160 RESISTANT Resistant     CLINDAMYCIN RESISTANT Resistant     RIFAMPIN <=0.5 SENSITIVE Sensitive     Inducible Clindamycin POSITIVE Resistant     * STAPHYLOCOCCUS EPIDERMIDIS   Staphylococcus hominis - MIC*    CIPROFLOXACIN 4 RESISTANT Resistant     ERYTHROMYCIN >=8 RESISTANT Resistant     GENTAMICIN <=0.5 SENSITIVE Sensitive     OXACILLIN >=4 RESISTANT Resistant     TETRACYCLINE <=1 SENSITIVE Sensitive     VANCOMYCIN 1 SENSITIVE Sensitive     TRIMETH/SULFA 160 RESISTANT Resistant     CLINDAMYCIN RESISTANT Resistant     RIFAMPIN <=0.5 SENSITIVE Sensitive     Inducible Clindamycin POSITIVE Resistant     * STAPHYLOCOCCUS HOMINIS  Blood Culture ID Panel (Reflexed)     Status: Abnormal   Collection Time: 02/19/20 10:48 AM  Result Value Ref Range Status   Enterococcus species NOT DETECTED NOT DETECTED Final   Listeria monocytogenes NOT DETECTED NOT DETECTED Final   Staphylococcus species DETECTED (A) NOT DETECTED Final    Comment: Methicillin (oxacillin) resistant coagulase negative staphylococcus. Possible blood culture contaminant (unless isolated from more than one blood culture draw or clinical case suggests pathogenicity). No antibiotic treatment is indicated for blood  culture contaminants. CRITICAL RESULT CALLED TO, READ BACK BY AND VERIFIED WITH: PHRMD V BRYK _0  02/20/20 BY S GEZAHEGN    Staphylococcus aureus (BCID) NOT DETECTED NOT DETECTED Final   Methicillin resistance DETECTED (A) NOT DETECTED Final    Comment: CRITICAL RESULT CALLED TO, READ BACK BY AND VERIFIED WITH: PHRMD V BRYK _1  02/20/20 BY S GEZAHEGN    Streptococcus species NOT DETECTED NOT DETECTED Final   Streptococcus agalactiae NOT DETECTED NOT DETECTED  Final   Streptococcus pneumoniae NOT DETECTED NOT DETECTED Final   Streptococcus pyogenes NOT DETECTED NOT DETECTED Final   Acinetobacter baumannii NOT DETECTED NOT DETECTED Final   Enterobacteriaceae species NOT DETECTED NOT DETECTED Final   Enterobacter cloacae complex NOT DETECTED NOT DETECTED Final   Escherichia coli NOT DETECTED NOT DETECTED Final   Klebsiella oxytoca NOT DETECTED NOT DETECTED Final   Klebsiella pneumoniae NOT DETECTED NOT DETECTED Final   Proteus species NOT DETECTED NOT DETECTED Final   Serratia marcescens NOT DETECTED NOT DETECTED Final   Haemophilus influenzae NOT DETECTED NOT DETECTED Final   Neisseria meningitidis NOT DETECTED NOT DETECTED Final   Pseudomonas aeruginosa NOT DETECTED NOT DETECTED Final   Candida albicans NOT DETECTED NOT DETECTED Final   Candida glabrata NOT DETECTED NOT DETECTED Final   Candida krusei NOT DETECTED NOT DETECTED Final   Candida parapsilosis NOT DETECTED NOT DETECTED Final   Candida tropicalis NOT DETECTED NOT DETECTED Final    Comment: Performed at [facility], [place]. [address] [place], [place] [address]  MRSA PCR Screening     Status: None   Collection Time: 02/19/20  8:59 PM   Specimen: Nasopharyngeal  Result Value Ref Range Status   MRSA by PCR NEGATIVE NEGATIVE Final    Comment:        The GeneXpert MRSA Assay (FDA approved for NASAL specimens only), is one component of a comprehensive MRSA colonization surveillance program. It is not intended to diagnose MRSA infection nor to guide or monitor treatment for MRSA infections. Performed at [facility], [place] [address], [place], [place] [address]   Culture, blood (Routine X 2) w Reflex to ID Panel     Status: None   Collection Time: 02/20/20 10:43 AM   Specimen:  BLOOD LEFT ARM  Result Value Ref Range Status   Specimen Description BLOOD LEFT ARM  Final   Special Requests   Final    BOTTLES DRAWN AEROBIC ONLY Blood Culture adequate volume   Culture    Final    NO GROWTH 5 DAYS Performed at [facility], [address] [place], [place] [address]    Report Status 02/25/2020 FINAL  Final  Culture, blood (Routine X 2) w Reflex to ID Panel     Status: None   Collection Time: 02/20/20 10:48 AM   Specimen: BLOOD LEFT WRIST  Result Value Ref Range Status   Specimen Description BLOOD LEFT WRIST  Final   Special Requests   Final    BOTTLES DRAWN AEROBIC ONLY Blood Culture results may not be optimal due to an inadequate volume of blood received in culture bottles   Culture   Final    NO GROWTH 5 DAYS Performed at [facility], [place] [address] [place], [place] [address]    Report Status 02/25/2020 FINAL  Final      Radiology Studies: No results found.   Scheduled Meds: . acetaminophen  650 mg Oral Q8H  . acidophilus  1 capsule Oral Daily  . ambrisentan  10 mg Oral Daily  . amiodarone  200 mg Oral Daily  . apixaban  5 mg Oral BID  . aspirin EC  81 mg Oral Daily  . colchicine  0.6 mg Oral BID  . fluticasone  2 spray Each Nare Daily  . umeclidinium bromide  2 puff Inhalation Daily   And  . fluticasone furoate-vilanterol  2 puff Inhalation Daily  . furosemide  40 mg Oral Daily  . guaiFENesin  600 mg Oral BID  . lidocaine  1 patch Transdermal Q24H  . nystatin  5 mL Oral QID  . pantoprazole  40 mg Oral Q1200  . predniSONE  40 mg Oral Q breakfast   Followed by  . [START ON 03/03/2020] predniSONE  30 mg Oral Q breakfast   Followed by  . [START ON 03/08/2020] predniSONE  20 mg Oral Q breakfast   Followed by  . [START ON 03/13/2020] predniSONE  10 mg Oral Q breakfast   Followed by  . [START ON 03/18/2020] predniSONE  5 mg Oral Q breakfast  . senna  1 tablet Oral Daily  . tadalafil (PAH)  40 mg Oral Daily   Continuous Infusions:   LOS: 9 days    Donnamae Jude, MD 02/28/2020 10:55 AM [phone] [facility] If 7PM-7AM, please contact night-coverage 02/28/2020, 10:55 AM

## 2020-02-28 NOTE — Progress Notes (Signed)
Occupational Therapy Treatment Patient Details Name: Linda Stevens MRN: [id number] DOB: 02/29/1946 Today's Date: 02/28/2020    History of present illness Pt adm with acute hypoxic respiratory failure and large pericardial effusion and PNA. PMH - diastolic heart failure, pulmonary htn, copd, htn   OT comments  Patient continues to make steady progress towards goals in skilled OT session. Patient's session encompassed transfer to [facility] and exercises in standing and EOB in order to increase activity tolerance. Pt continues to require max encouragement to participate due to anxiety, but was much more participatory in session, often attempting standing or seated exercises with little encouragement. Pt fatigues quickly, but was agreeable to increased movement later in the evening to ensure further progress. Pt would benefit from theraband exercises next session; will continue to follow acutely.    Follow Up Recommendations  Home health OT;Supervision/Assistance - 24 hour    Equipment Recommendations  Tub/shower seat    Recommendations for Other Services      Precautions / Restrictions Precautions Precaution Comments: watch SpO2 Restrictions Weight Bearing Restrictions: No       Mobility Bed Mobility Overal bed mobility: Needs Assistance Bed Mobility: Supine to Sit     Supine to sit: Min assist     General bed mobility comments: min A for pulling against therapist to come to EoB,  Transfers Overall transfer level: Needs assistance Equipment used: 1 person hand held assist;Rolling walker (2 wheeled) Transfers: Sit to/from Stand Sit to Stand: Min assist         General transfer comment: Min A to steady, able to transfer to [facility], and complete sit<>stands x4 in session    Balance Overall balance assessment: Needs assistance Sitting-balance support: No upper extremity supported;Feet supported Sitting balance-Leahy Scale: Good Sitting balance - Comments: Fatigues quicikly    Standing balance support: Bilateral upper extremity supported Standing balance-Leahy Scale: Poor Standing balance comment: requires light bilateral UE support for static standing for pericare                           ADL either performed or assessed with clinical judgement   ADL Overall ADL's : Needs assistance/impaired                         Toilet Transfer: BSC;Cueing for safety;Min guard;Stand-pivot Toilet Transfer Details (indicate cue type and reason): Cues for hand placement Toileting- Clothing Manipulation and Hygiene: Total assistance Toileting - Clothing Manipulation Details (indicate cue type and reason): Due to decreased balance peri-care performed for pt in standing     Functional mobility during ADLs: Minimal assistance;Moderate assistance General ADL Comments: Pt with increased participation and decreased anxiety, however responds well to max encouragment to complete tasks     Vision       Perception     Praxis      Cognition Arousal/Alertness: Awake/alert Behavior During Therapy: Anxious Overall Cognitive Status: Within Functional Limits for tasks assessed                                 General Comments: pt continues to be anxious but is more willing to work with therapy today         Exercises Other Exercises Other Exercises: Marches in sitting and standing (walker for stability in standing) Other Exercises: Leg kicks in sitting   Shoulder Instructions  General Comments      Pertinent Vitals/ Pain       Pain Assessment: No/denies pain  Home Living                                          Prior Functioning/Environment              Frequency  Min 2X/week        Progress Toward Goals  OT Goals(current goals can now be found in the care plan section)  Progress towards OT goals: Progressing toward goals  Acute Rehab OT Goals Patient Stated Goal: return home OT Goal  Formulation: With patient Time For Goal Achievement: 03/07/20 Potential to Achieve Goals: Good  Plan Discharge plan remains appropriate    Co-evaluation                 AM-PAC OT "6 Clicks" Daily Activity     Outcome Measure   Help from another person eating meals?: A Little Help from another person taking care of personal grooming?: A Little Help from another person toileting, which includes using toliet, bedpan, or urinal?: A Lot Help from another person bathing (including washing, rinsing, drying)?: A Lot Help from another person to put on and taking off regular upper body clothing?: A Little Help from another person to put on and taking off regular lower body clothing?: A Lot 6 Click Score: 15    End of Session Equipment Utilized During Treatment: Gait belt;Oxygen;Rolling walker  OT Visit Diagnosis: Muscle weakness (generalized) (M62.81);Unsteadiness on feet (R26.81);Other (comment)   Activity Tolerance Patient tolerated treatment well;Patient limited by fatigue   Patient Left in chair;with call bell/phone within reach;with family/visitor present   Nurse Communication Mobility status        Time: 1340-1359 OT Time Calculation (min): 19 min  Charges: OT General Charges $OT Visit: 1 Visit OT Treatments $Self Care/Home Management : 8-22 mins  Corinne Ports E. Nasri Boakye, COTA/L Acute Rehabilitation Services [place] 02/28/2020, 2:45 PM

## 2020-02-29 LAB — CBC
HCT: 36.1 % (ref 36.0–46.0)
Hemoglobin: 11.2 g/dL — ABNORMAL LOW (ref 12.0–15.0)
MCH: 31.2 pg (ref 26.0–34.0)
MCHC: 31 g/dL (ref 30.0–36.0)
MCV: 100.6 fL — ABNORMAL HIGH (ref 80.0–100.0)
Platelets: 143 10*3/uL — ABNORMAL LOW (ref 150–400)
RBC: 3.59 MIL/uL — ABNORMAL LOW (ref 3.87–5.11)
RDW: 17.4 % — ABNORMAL HIGH (ref 11.5–15.5)
WBC: 13.4 10*3/uL — ABNORMAL HIGH (ref 4.0–10.5)
nRBC: 0 % (ref 0.0–0.2)

## 2020-02-29 LAB — COMPREHENSIVE METABOLIC PANEL
ALT: 22 U/L (ref 0–44)
AST: 17 U/L (ref 15–41)
Albumin: 2.8 g/dL — ABNORMAL LOW (ref 3.5–5.0)
Alkaline Phosphatase: 78 U/L (ref 38–126)
Anion gap: 8 (ref 5–15)
BUN: 11 mg/dL (ref 8–23)
CO2: 36 mmol/L — ABNORMAL HIGH (ref 22–32)
Calcium: 8 mg/dL — ABNORMAL LOW (ref 8.9–10.3)
Chloride: 95 mmol/L — ABNORMAL LOW (ref 98–111)
Creatinine, Ser: 1.03 mg/dL — ABNORMAL HIGH (ref 0.44–1.00)
GFR calc Af Amer: >60 mL/min (ref >60)
GFR calc non Af Amer: 55 mL/min — ABNORMAL LOW (ref >60)
Glucose, Bld: 152 mg/dL — ABNORMAL HIGH (ref 70–99)
Potassium: 4.2 mmol/L (ref 3.5–5.1)
Sodium: 139 mmol/L (ref 135–145)
Total Bilirubin: 1 mg/dL (ref 0.3–1.2)
Total Protein: 5.3 g/dL — ABNORMAL LOW (ref 6.5–8.1)

## 2020-02-29 MED ORDER — LOPERAMIDE HCL 2 MG PO CAPS
2.0000 mg | ORAL_CAPSULE | Freq: Four times a day (QID) | ORAL | Status: DC | PRN
  Administered 2020-02-29 – 2020-03-10 (×21): 2 mg via ORAL
  Filled 2020-02-29 (×22): qty 1

## 2020-02-29 NOTE — Progress Notes (Signed)
BP 93/40. Pt asymptomatic. CHF [place] paged. Verbal order to hold PO lasix for now and administer all other medications. Will continue to monitor closely.

## 2020-02-29 NOTE — Progress Notes (Addendum)
PROGRESS NOTE    Linda Stevens  JQB:[id number] DOB: October 15, 1945 DOA: 02/19/2020 PCP: [name], MD   Brief Narrative: 74 year old female with history of COPD/chronic RF on 3 L, diastolic CHF, pulmonary HTN, HTN and HLD presenting with progressive shortness of breath for 1 week. Tried with a course of Z-Pak and a steroid taper by her pulmonologist on 6/9 but continues to have progressive shortness of breath.  In ED, slightly hypotensive. Required 6 L by [place]. WBC 11.4. AST 90. Total bili 1.9. Lactic acid 2.8. BNP 164. Trop negative x2. CXR concerning for cardiomegaly with small pleural effusion and by basilar atelectasis. CTA chest showed sizable pericardial effusion, bilateral pleural effusions and consolidation concerning for pneumonia and pulmonary HTN. Cultures drawn. Received IV Solu-Medrol, morphine, Rocephin and azithromycin,and admitted for acute on chronic respiratory failure due to pneumonia, pleural effusions and pericardial effusion. Cardiology consulted for pericardial effusion. Echo obtained and revealed dilated and fixed IVC although no other features of tamponade.  The next day, blood cultures with GPC's in clusters in 4/4. BICD with coag negative MR staph. Vancomycin added. ID consulted. Azithromycin and ceftriaxone discontinued. Repeat blood culture obtained and NGTD. Eventually, vancomycin discontinued as susceptibilities on initial blood culture were different suggesting contamination versus true bacteremia.  Patient went into A. fib with RVR with hypotension on 6/14. Started on amiodarone drip and converted to sinus rhythm. Advanced heart failure team and palliative medicine involved as well.    Assessment & Plan:   Principal Problem:   Positive blood cultures Active Problems:   Goals of care, counseling/discussion   Congestive heart failure (CHF) (HCC)   Pulmonary artery hypertension (HCC)   Acute on chronic diastolic congestive heart  failure (HCC)   Acute on chronic respiratory failure with hypoxia (HCC)   Pericardial effusion   Acute respiratory failure with hypoxia and hypercapnea   Hepatic cirrhosis (HCC)   Pleural effusion, bilateral   Community acquired pneumonia   New onset atrial fibrillation ([place])   Palliative care by specialist  #1 large pericardial effusion without evidence of tamponade.  Repeat echo 02/21/2020 pericardial effusion smaller but still remain large.  Patient was stable hemodynamically.  Seen by cardiology.  There was no indication for pericardiocentesis or window at this time.  Rheumatology work-up has been negative.  Continue colchicine.  #2 pulmonary hypertension on sildenafil and R's.  #3 chronic diastolic heart failure with preserved ejection fraction grade 1 diastolic dysfunction.  Continue Lasix 40 mg daily.  #4 paroxysmal atrial fibrillation initially treated with amiodarone now in sinus rhythm. Cardiology okay to start Eliquis on 02/24/2020.  #5 AKI improved  #6 acute metabolic encephalopathy seems to have resolved. Work-up included ammonia elevated at 52 TSH and B12 were normal.  She was on morphine tramadol and Phenergan which could contribute which has been stopped. She was found to be hypercarbic at 79. Treated with BiPAP.  #7+ blood cultures deemed to be contamination.  ID was consulted did not recommend antibiotics.  Transthoracic echo without vegetation.  #8 community-acquired pneumonia status post IV antibiotics finish the course.   Estimated body mass index is 23.93 kg/m as calculated from the following:   Height as of this encounter: _0  (1.6 m).   Weight as of this encounter: 61.3 kg.  DVT prophylaxis:eliquis Code Status: full Family Communication: Discussed with daughter Disposition Plan:  Status is: Inpatient  Dispo: The patient is from:home              Anticipated d/c is IO:XBDZ  Anticipated d/c date is 24 hrs plan discharge 03/01/2020 if blood  work is good              Patient currently Admitted with pericardial effusion, family agrees to take her home tomorrow.  She is stable for discharge.  However family will be comfortable taking her home tomorrow.   Consultants: palliative care   Procedures:egd 6/22 Antimicrobials: Anti-infectives (From admission, onward)   Start     Dose/Rate Route Frequency Ordered Stop   02/21/20 0600  vancomycin (VANCOCIN) IVPB 1000 mg/200 mL premix  Status:  Discontinued        1,000 mg 200 mL/hr over 60 Minutes Intravenous Every 24 hours 02/20/20 0621 02/23/20 1223   02/20/20 1000  cefTRIAXone (ROCEPHIN) 2 g in sodium chloride 0.9 % 100 mL IVPB  Status:  Discontinued        2 g 200 mL/hr over 30 Minutes Intravenous Every 24 hours 02/19/20 1342 02/20/20 0616   02/20/20 1000  azithromycin (ZITHROMAX) 500 mg in sodium chloride 0.9 % 250 mL IVPB  Status:  Discontinued        500 mg 250 mL/hr over 60 Minutes Intravenous Every 24 hours 02/19/20 1342 02/20/20 1019   02/20/20 1000  cefTRIAXone (ROCEPHIN) 2 g in sodium chloride 0.9 % 100 mL IVPB  Status:  Discontinued        2 g 200 mL/hr over 30 Minutes Intravenous Every 24 hours 02/20/20 0913 02/21/20 0749   02/20/20 0645  vancomycin (VANCOREADY) IVPB 1250 mg/250 mL        1,250 mg 166.7 mL/hr over 90 Minutes Intravenous  Once 02/20/20 0621 02/21/20 1649   02/19/20 1015  cefTRIAXone (ROCEPHIN) 1 g in sodium chloride 0.9 % 100 mL IVPB        1 g 200 mL/hr over 30 Minutes Intravenous  Once 02/19/20 1008 02/19/20 1151   02/19/20 1015  azithromycin (ZITHROMAX) 500 mg in sodium chloride 0.9 % 250 mL IVPB        500 mg 250 mL/hr over 60 Minutes Intravenous  Once 02/19/20 1008 02/19/20 1324      Subjective: Resting in bed denies any specific complaints No nausea vomiting   Objective: Vitals:   02/29/20 0735 02/29/20 0900 02/29/20 1125 02/29/20 1135  BP: (!) 102/47 (!) 93/40  (!) 119/46  Pulse: 68 70 71 69  Resp: _0 Temp: 97.8 F (36.6 C)   98 F (36.7 C)   TempSrc: Oral  Oral   SpO2: 94% 95% 92% 92%  Weight:      Height:        Intake/Output Summary (Last 24 hours) at 02/29/2020 1224 Last data filed at 02/29/2020 1100 Gross per 24 hour  Intake 564 ml  Output 150 ml  Net 414 ml   Filed Weights   02/27/20 0331 02/28/20 0324 02/29/20 0633  Weight: 65.7 kg 64.9 kg 61.3 kg    Examination:  General exam: Appears calm and comfortable  Respiratory system: Clear to auscultation. Respiratory effort normal. Cardiovascular system: S1 & S2 heard, RRR. No JVD, murmurs, rubs, gallops or clicks. No pedal edema. Gastrointestinal system: Abdomen is nondistended, soft and nontender. No organomegaly or masses felt. Normal bowel sounds heard. Central nervous system: Alert and oriented. No focal neurological deficits. Extremities: Symmetric 5 x 5 power. Skin: No rashes, lesions or ulcers Psychiatry: Judgement and insight appear normal. Mood & affect appropriate.     Data Reviewed: I have personally reviewed following labs and imaging studies  CBC: Recent Labs  Lab 02/23/20 0339 02/25/20 0405 02/26/20 0718  WBC 9.8 10.0 11.6*  NEUTROABS  --  8.6*  --   HGB 9.8* 9.2* 10.1*  HCT 31.5* 30.2* 32.3*  MCV 101.0* 100.7* 100.0  PLT 232 193 354   Basic Metabolic Panel: Recent Labs  Lab 02/23/20 0339 02/24/20 0433 02/25/20 0405 02/26/20 0718  NA 140 142 145 142  K 4.0 3.6 3.5 4.4  CL 90* 91* 93* 92*  CO2 40* 41* 43* 41*  GLUCOSE 119* 108* 102* 111*  BUN 58* 55* 42* 33*  CREATININE 1.35* 1.32* 1.19* 1.07*  CALCIUM 8.6* 8.5* 8.3* 8.2*  MG 3.2* 3.0* 2.9* 2.6*  PHOS 4.4 3.7 2.5  --    GFR: Estimated Creatinine Clearance: 39.9 mL/min (A) (by C-G formula based on SCr of 1.07 mg/dL (H)). Liver Function Tests: Recent Labs  Lab 02/23/20 0339 02/24/20 0433 02/25/20 0405 02/26/20 0718  AST 26  --   --  13*  ALT 47*  --   --  27  ALKPHOS 92  --   --  78  BILITOT 0.8  --   --  0.6  PROT 5.8*  --   --  5.5*  ALBUMIN 2.7*   2.7* 2.6* 2.6* 2.8*   No results for input(s): LIPASE, AMYLASE in the last 168 hours. Recent Labs  Lab 02/23/20 1040 02/24/20 0433  AMMONIA 52* 34   Coagulation Profile: No results for input(s): INR, PROTIME in the last 168 hours. Cardiac Enzymes: No results for input(s): CKTOTAL, CKMB, CKMBINDEX, TROPONINI in the last 168 hours. BNP (last 3 results) No results for input(s): PROBNP in the last 8760 hours. HbA1C: No results for input(s): HGBA1C in the last 72 hours. CBG: No results for input(s): GLUCAP in the last 168 hours. Lipid Profile: No results for input(s): CHOL, HDL, LDLCALC, TRIG, CHOLHDL, LDLDIRECT in the last 72 hours. Thyroid Function Tests: No results for input(s): TSH, T4TOTAL, FREET4, T3FREE, THYROIDAB in the last 72 hours. Anemia Panel: No results for input(s): VITAMINB12, FOLATE, FERRITIN, TIBC, IRON, RETICCTPCT in the last 72 hours. Sepsis Labs: No results for input(s): PROCALCITON, LATICACIDVEN in the last 168 hours.  Recent Results (from the past 240 hour(s))  MRSA PCR Screening     Status: None   Collection Time: 02/19/20  8:59 PM   Specimen: Nasopharyngeal  Result Value Ref Range Status   MRSA by PCR NEGATIVE NEGATIVE Final    Comment:        The GeneXpert MRSA Assay (FDA approved for NASAL specimens only), is one component of a comprehensive MRSA colonization surveillance program. It is not intended to diagnose MRSA infection nor to guide or monitor treatment for MRSA infections. Performed at [facility], [place] [address], [place], [place] [address]   Culture, blood (Routine X 2) w Reflex to ID Panel     Status: None   Collection Time: 02/20/20 10:43 AM   Specimen: BLOOD LEFT ARM  Result Value Ref Range Status   Specimen Description BLOOD LEFT ARM  Final   Special Requests   Final    BOTTLES DRAWN AEROBIC ONLY Blood Culture adequate volume   Culture   Final    NO GROWTH 5 DAYS Performed at [facility], [place] [address],  [place], [place] [address]    Report Status 02/25/2020 FINAL  Final  Culture, blood (Routine X 2) w Reflex to ID Panel     Status: None   Collection Time: 02/20/20 10:48  AM   Specimen: BLOOD LEFT WRIST  Result Value Ref Range Status   Specimen Description BLOOD LEFT WRIST  Final   Special Requests   Final    BOTTLES DRAWN AEROBIC ONLY Blood Culture results may not be optimal due to an inadequate volume of blood received in culture bottles   Culture   Final    NO GROWTH 5 DAYS Performed at [facility], [address], [place], [place] [address]    Report Status 02/25/2020 FINAL  Final         Radiology Studies: CT ABDOMEN PELVIS W CONTRAST  Result Date: 02/29/2020 CLINICAL DATA:  Epigastric and left upper quadrant abdominal pain. EXAM: CT ABDOMEN AND PELVIS WITH CONTRAST TECHNIQUE: Multidetector CT imaging of the abdomen and pelvis was performed using the standard protocol following bolus administration of intravenous contrast. CONTRAST:  168m OMNIPAQUE IOHEXOL 300 MG/ML  SOLN COMPARISON:  Included portion from chest CT 02/19/2020, no prior abdominal imaging available. FINDINGS: Lower chest: Diminished pericardial effusion from prior exam, small residual. Small bilateral pleural effusions, enhancing and partially loculated on the right. Adjacent bilateral lower lobe airspace opacities. Hepatobiliary: No focal liver lesion is seen. Equivocal lobulated hepatic contours. Clips in the gallbladder fossa postcholecystectomy. No biliary dilatation. Pancreas: No ductal dilatation or inflammation. Spleen: Normal in size without focal abnormality. No splenic infarct. Adrenals/Urinary Tract: No adrenal nodule. No hydronephrosis or perinephric edema. Homogeneous renal enhancement with symmetric excretion on delayed phase imaging. Urinary bladder is physiologically distended without wall thickening. Stomach/Bowel: The stomach is unremarkable. There is no small bowel obstruction, administered enteric  contrast reaches the colon. No small bowel inflammation. Normal appendix. Left colonic diverticulosis without diverticulitis. There is no colonic wall thickening or inflammatory change. Vascular/Lymphatic: Aorto bi-iliac atherosclerosis. No aortic aneurysm. Patent portal vein. No adenopathy. Reproductive: Uterus and bilateral adnexa are unremarkable. Other: Small volume abdominopelvic free fluid, and both upper quadrants, right pericolic gutter, and in the pelvis. No free air. No focal fluid collection in the abdomen. Small umbilical hernia. Subcutaneous edema in bilateral flanks. Musculoskeletal: There are no acute or suspicious osseous abnormalities. IMPRESSION: 1. No acute abnormality in the abdomen/pelvis. 2. Small volume abdominopelvic free fluid/ascites. 3. Small bilateral pleural effusions, enhancing and partially loculated on the right. Adjacent bilateral lower lobe airspace opacities. Similar appearance was seen on recent chest CTA. Pericardial effusion has diminished. 4. Colonic diverticulosis without diverticulitis. 5. Equivocal lobulated hepatic contours, can be seen with cirrhosis. Recommend correlation with cirrhosis risk factors. Aortic Atherosclerosis (ICD10-I70.0). Electronically Signed   By: MKeith RakeM.D.   On: 02/29/2020 00:20        Scheduled Meds: . acetaminophen  650 mg Oral Q8H  . acidophilus  1 capsule Oral Daily  . ambrisentan  10 mg Oral Daily  . amiodarone  200 mg Oral Daily  . apixaban  5 mg Oral BID  . aspirin EC  81 mg Oral Daily  . colchicine  0.3 mg Oral BID  . fluticasone  2 spray Each Nare Daily  . umeclidinium bromide  2 puff Inhalation Daily   And  . fluticasone furoate-vilanterol  2 puff Inhalation Daily  . furosemide  40 mg Oral Daily  . guaiFENesin  600 mg Oral BID  . lidocaine  1 patch Transdermal Q24H  . nystatin  5 mL Oral QID  . pantoprazole  40 mg Oral Q1200  . predniSONE  40 mg Oral Q breakfast   Followed by  . [START ON 03/03/2020]  predniSONE  30  mg Oral Q breakfast   Followed by  . [START ON 03/08/2020] predniSONE  20 mg Oral Q breakfast   Followed by  . [START ON 03/13/2020] predniSONE  10 mg Oral Q breakfast   Followed by  . [START ON 03/18/2020] predniSONE  5 mg Oral Q breakfast  . senna  1 tablet Oral Daily  . tadalafil (PAH)  40 mg Oral Daily   Continuous Infusions:   LOS: 10 days       Georgette Shell, MD  02/29/2020, 12:24 PM

## 2020-02-29 NOTE — TOC Progression Note (Signed)
Transition of Care [facility]) - Progression Note    Patient Details  Name: Linda Stevens MRN: [id number] Date of Birth: November 02, 1945  Transition of Care [facility]) CM/SW Contact  Loletha Grayer Beverely Pace, RN Phone Number: [phone] (working remotely) 02/29/2020, 10:47 AM  Clinical Narrative:   Case manager spoke with patient's husband via telephone to discuss Home Health needs. Explained that only agency that would accept patient's insurance is [facility], he is comfortable with this. Referral was called to [facility] with [facility]. Mr. Buresh said that he will be checking with church to see if they have a walker and 3in1 he can borrow, if not he will notify case manager.Per notes and MD patient is not ready for DC at this time. [facility] will not have staff available before the weekend and this was shared with MD,RN and Patient's husband.  TOC team will continue to monitor.    Expected Discharge Plan: [place] Barriers to Discharge: Continued Medical Work up  Expected Discharge Plan and Services Expected Discharge Plan: [place]   Discharge Planning Services: CM Consult Post Acute Care Choice: Durable Medical Equipment, Home Health Living arrangements for the past 2 months: Single Family Home                           HH Arranged: PT, OT [facility] Agency: [facility] Date [place]: 02/29/20 Time [place]: 1030 Representative spoke with at [place]: [place] ([place]) Interventions    Readmission Risk Interventions No flowsheet data found.

## 2020-03-01 NOTE — Progress Notes (Signed)
Physical Therapy Treatment Patient Details Name: Linda Stevens MRN: [id number] DOB: 02-01-1946 Today's Date: 03/01/2020    History of Present Illness Pt adm with acute hypoxic respiratory failure and large pericardial effusion and PNA. PMH - diastolic heart failure, pulmonary htn, copd, htn    PT Comments    Pt eager to work with therapy today after Palliative meeting, she has renewed hope in her ability to get well and go home. Pt limited in safe mobility by anxiety with increased oxygen demand and frustration with how long it takes for her to recover from minimal activity. Pt with decreased strength and endurance. Pt is min A for bed mobility, transfers and ambulation of 3 feet with RW. Pt will benefit from SNF level rehab to increase strength and learn energy conservation for ultimate return home with increased independence. PT will continue to follow acutely.    Follow Up Recommendations  SNF     Equipment Recommendations  Other (comment) (TBD at next venue)       Precautions / Restrictions Precautions Precautions: Other (comment) Precaution Comments: watch SpO2 Restrictions Weight Bearing Restrictions: No    Mobility  Bed Mobility Overal bed mobility: Needs Assistance Bed Mobility: Supine to Sit     Supine to sit: Min assist     General bed mobility comments: min A for pulling against therapist to come to EoB,  Transfers Overall transfer level: Needs assistance Equipment used: 1 person hand held assist;Rolling walker (2 wheeled) Transfers: Sit to/from Stand Sit to Stand: Min assist         General transfer comment: min guard for safety, vc to scoot hips to EoB for power up  Ambulation/Gait Ambulation/Gait assistance: Min assist Gait Distance (Feet): 3 Feet (3 feet bed to BSC and 3 feet BSC to recliner ) Assistive device: Rolling walker (2 wheeled) Gait Pattern/deviations: Step-to pattern;Decreased step length - right;Decreased step length - left Gait  velocity: decr Gait velocity interpretation: <1.31 ft/sec, indicative of household ambulator General Gait Details: supervision for safety, vc for RW management, strong steady mobility          Balance Overall balance assessment: Needs assistance Sitting-balance support: No upper extremity supported;Feet supported Sitting balance-Leahy Scale: Good     Standing balance support: Bilateral upper extremity supported Standing balance-Leahy Scale: Poor Standing balance comment: requires light bilateral UE support                             Cognition Arousal/Alertness: Awake/alert Behavior During Therapy: Anxious Overall Cognitive Status: Within Functional Limits for tasks assessed                                 General Comments: pt continues to be anxious but is more willing to work with therapy today          General Comments General comments (skin integrity, edema, etc.): Pt on 3L O2 via  96%O2 on entry, with transfer to [facility] SaO2 dropped to 88%O2 with cuing for purse lip breathing rebounded to 90%O2, similar situation with transfer from [facility] to recliner, however pt able to complet purse lip breathing with out cuing       Pertinent Vitals/Pain Pain Assessment: No/denies pain           PT Goals (current goals can now be found in the care plan section) Acute Rehab PT Goals Patient Stated Goal: return home  PT Goal Formulation: With patient Time For Goal Achievement: 03/07/20 Potential to Achieve Goals: Good Progress towards PT goals: Progressing toward goals    Frequency    Min 2X/week      PT Plan Current plan remains appropriate       AM-PAC PT "6 Clicks" Mobility   Outcome Measure  Help needed turning from your back to your side while in a flat bed without using bedrails?: None Help needed moving from lying on your back to sitting on the side of a flat bed without using bedrails?: None Help needed moving to and from a bed to a  chair (including a wheelchair)?: A Little Help needed standing up from a chair using your arms (e.g., wheelchair or bedside chair)?: A Little Help needed to walk in hospital room?: A Little Help needed climbing 3-5 steps with a railing? : A Lot 6 Click Score: 19    End of Session Equipment Utilized During Treatment: Gait belt;Oxygen Activity Tolerance: Patient tolerated treatment well;Other (comment);Treatment limited secondary to medical complications (Comment) (oxygen desaturation ) Patient left: in chair;with call bell/phone within reach;with chair alarm set Nurse Communication: Mobility status PT Visit Diagnosis: Other abnormalities of gait and mobility (R26.89);Muscle weakness (generalized) (M62.81)     Time: 6773-7366 PT Time Calculation (min) (ACUTE ONLY): 41 min  Charges:  $Therapeutic Activity: 38-52 mins                     Taym Twist B. Migdalia Dk PT, DPT Acute Rehabilitation Services Pager [phone] Office [phone]    [place] 03/01/2020, 12:56 PM

## 2020-03-01 NOTE — Progress Notes (Signed)
Advanced Heart Failure Rounding Note  PCP-Cardiologist: Linda Bickers, MD   Subjective:    Feeling better but concerned that her BP continues to drop.   Denies SOB.   Objective:   Weight Range: 60.5 kg Body mass index is 23.61 kg/m.   Vital Signs:   Temp:  [97.4 F (36.3 C)-98.2 F (36.8 C)] 97.4 F (36.3 C) (06/24 1132) Pulse Rate:  [63-74] 74 (06/24 1132) Resp:  [16-20] 20 (06/24 0725) BP: (94-118)/(35-52) 112/44 (06/24 1132) SpO2:  [92 %-97 %] 92 % (06/24 1132) Weight:  [60.5 kg] 60.5 kg (06/24 0500) Last BM Date: 02/29/20  Weight change: Filed Weights   02/28/20 0324 02/29/20 0633 03/01/20 0500  Weight: 64.9 kg 61.3 kg 60.5 kg    Intake/Output:   Intake/Output Summary (Last 24 hours) at 03/01/2020 1633 Last data filed at 03/01/2020 1300 Gross per 24 hour  Intake 458 ml  Output --  Net 458 ml      Physical Exam    General:  Well appearing. No resp difficulty HEENT: normal Neck: supple. no JVD. Carotids 2+ bilat; no bruits. No lymphadenopathy or thryomegaly appreciated. Cor: PMI nondisplaced. Regular rate & rhythm. No rubs, gallops or murmurs. Lungs: decreased BS throughtout Abdomen: soft, nontender, nondistended. No hepatosplenomegaly. No bruits or masses. Good bowel sounds. Extremities: no cyanosis, clubbing, rash, edema Neuro: alert & orientedx3, cranial nerves grossly intact. moves all 4 extremities w/o difficulty. Affect pleasant   Telemetry  NSR 70s personally reviewed.    Labs    CBC Recent Labs    02/29/20 1431  WBC 13.4*  HGB 11.2*  HCT 36.1  MCV 100.6*  PLT 045*   Basic Metabolic Panel Recent Labs    02/29/20 1431  NA 139  K 4.2  CL 95*  CO2 36*  GLUCOSE 152*  BUN 11  CREATININE 1.03*  CALCIUM 8.0*   Liver Function Tests Recent Labs    02/29/20 1431  AST 17  ALT 22  ALKPHOS 78  BILITOT 1.0  PROT 5.3*  ALBUMIN 2.8*   No results for input(s): LIPASE, AMYLASE in the last 72 hours. Cardiac Enzymes No  results for input(s): CKTOTAL, CKMB, CKMBINDEX, TROPONINI in the last 72 hours.  BNP: BNP (last 3 results) Recent Labs    05/12/19 1224 12/09/19 1239 02/19/20 0405  BNP 272.8* 324.2* 164.0*    ProBNP (last 3 results) No results for input(s): PROBNP in the last 8760 hours.   D-Dimer No results for input(s): DDIMER in the last 72 hours. Hemoglobin A1C No results for input(s): HGBA1C in the last 72 hours. Fasting Lipid Panel No results for input(s): CHOL, HDL, LDLCALC, TRIG, CHOLHDL, LDLDIRECT in the last 72 hours. Thyroid Function Tests No results for input(s): TSH, T4TOTAL, T3FREE, THYROIDAB in the last 72 hours.  Invalid input(s): FREET3  Other results:   Imaging    No results found.   Medications:     Scheduled Medications:  acetaminophen  650 mg Oral Q8H   acidophilus  1 capsule Oral Daily   ambrisentan  10 mg Oral Daily   amiodarone  200 mg Oral Daily   apixaban  5 mg Oral BID   aspirin EC  81 mg Oral Daily   colchicine  0.3 mg Oral BID   fluticasone  2 spray Each Nare Daily   umeclidinium bromide  2 puff Inhalation Daily   And   fluticasone furoate-vilanterol  2 puff Inhalation Daily   furosemide  40 mg Oral Daily   guaiFENesin  600 mg Oral BID   lidocaine  1 patch Transdermal Q24H   nystatin  5 mL Oral QID   pantoprazole  40 mg Oral Q1200   predniSONE  40 mg Oral Q breakfast   Followed by   Derrill Memo ON 03/03/2020] predniSONE  30 mg Oral Q breakfast   Followed by   Derrill Memo ON 03/08/2020] predniSONE  20 mg Oral Q breakfast   Followed by   Derrill Memo ON 03/13/2020] predniSONE  10 mg Oral Q breakfast   Followed by   Derrill Memo ON 03/18/2020] predniSONE  5 mg Oral Q breakfast   tadalafil (PAH)  40 mg Oral Daily    Infusions:   PRN Medications: hydrOXYzine, loperamide, ondansetron **OR** ondansetron (ZOFRAN) IV, traMADol   Assessment/Plan   1. Pericardial Effusion  - Large pericardial effusion on ECHO and CTA.  - Repeat ECHO  6/15:  Pericardial effusion seems a bit smaller but still large.  - hemodynamically stable currently. No indication for pericardiocentesis or window currently.  - Effusion much improved after starting steroids.  - Rheum serologies remain surprisingly negative. Need to consider other etiologies for effusion and pleuritic CP (? Infectious/viral). Possible inflammatory process.  - Would do steroid taper over 3 weeks. - Colchicine for 3 months.  - Repeat echo in 6 weeks   2. Pulmonary HTN - On sildenafil + letaris , continue  3. Chronic Diastolic HF  - volume status looks good currentl  5. PAF -Started on amio drip 6/14- converted to NSR.  -Reverted back to Afib w/ RVR 6/15. Converted back to NSR w/ amio bolus - Continue po amio and eliquis 200 daily - Stop amio as outpatient  6. Hypotension - can start midodrine 2.5 tid as needed to support  We will sign off. Will arrange outpatient f/u     Length of Stay: 11  Linda Bickers, MD  03/01/2020, 4:33 PM  Advanced Heart Failure Team Pager [phone] (M-F; 7a - 4p)  Please contact [facility] for night-coverage after hours (4p -7a ) and weekends on [email or web address]

## 2020-03-01 NOTE — Progress Notes (Signed)
Daily Progress Note   Patient Name: Linda Stevens       Date: 03/01/2020 DOB: 1946/05/01  Age: 74 y.o. MRN#: [id number] Attending Physician: Georgette Shell, MD Primary Care Physician: [name], MD Admit Date: 02/19/2020  Reason for Consultation/Follow-up: Establishing goals of care  Subjective: Patient awake, alert, oriented. In good spirits this morning. Denies pain or dyspnea. C/o of diarrhea but imodium is helping. Appetite better.   GOC: Received call from daughter, [name] this AM expressing concerns with discharge plan home with home health. [name] is wondering if her mother would benefit from SNF rehab in hopes of getting stronger and getting back home. Reassured [name] that I would discuss with Dr. Rodena Piety, RN and PT.    F/u GOC with patient, husband, daughter [name]), and daughter [name]) is on speaker phone.   Discussed in detail course of hospitalization including diagnoses, interventions, plan of care, recommendations from specialists. Discussed irreversible, chronic conditions and unfortunately that this will continue to impact her functional status. Discussed risk for recurrent hospitalization with chronic conditions and challenges with managing one condition that will affect another (example: lasix but with ongoing soft blood pressures). Angie does mention her understanding that this will be a cycle. Discussed ongoing medical management.   Teela asks me where I feel she should go after discharge. Explained that this depends on her goals for herself. She could attempt SNF rehab with hopes to get home and stronger but also explained that she may continue to have functional limitations because of her pulmonary hypertension, heart failure, and COPD. Explained that we  wish to honor her wishes--whether that be ongoing aggressive medical management/SNF rehab versus comfort/quality of life at home.  Kyley is in better spirits today compared to my visit last week. The prn tramadol and atarax are helping manage her symptoms. She is glad she is tolerating food and feels she is doing better with therapy. She does admit to increased shortness of breath with exertion.   After further thought and discussion, Shakila shares that she wishes to attempt SNF rehab and is hopeful to get stronger. Explained that if she does not show improvement at rehab, further discussions can occur based on her goals and wishes for home. Encouraged outpatient palliative follow-up which patient and family agree with.   Briefly discussed ongoing discussions  regarding her code status and wishes. Encouraged consideration of limitations including DNR/DNI with fear that these interventions will not change her outcome with chronic conditions. Discussed treating the treatable but recommendation against heroic measures if she was critically ill. Patient does seem to be leaning towards DNR status but does plan to further discuss this with husband and daughters. Discussed MOST form and explained that if they are ready, we can complete this form together prior to her discharge.   Answered questions/concerns. Emotional support provided. Family has PMT contact information.   Length of Stay: 11  Current Medications: Scheduled Meds:  . acetaminophen  650 mg Oral Q8H  . acidophilus  1 capsule Oral Daily  . ambrisentan  10 mg Oral Daily  . amiodarone  200 mg Oral Daily  . apixaban  5 mg Oral BID  . aspirin EC  81 mg Oral Daily  . colchicine  0.3 mg Oral BID  . fluticasone  2 spray Each Nare Daily  . umeclidinium bromide  2 puff Inhalation Daily   And  . fluticasone furoate-vilanterol  2 puff Inhalation Daily  . furosemide  40 mg Oral Daily  . guaiFENesin  600 mg Oral BID  . lidocaine  1 patch  Transdermal Q24H  . nystatin  5 mL Oral QID  . pantoprazole  40 mg Oral Q1200  . predniSONE  40 mg Oral Q breakfast   Followed by  . [START ON 03/03/2020] predniSONE  30 mg Oral Q breakfast   Followed by  . [START ON 03/08/2020] predniSONE  20 mg Oral Q breakfast   Followed by  . [START ON 03/13/2020] predniSONE  10 mg Oral Q breakfast   Followed by  . [START ON 03/18/2020] predniSONE  5 mg Oral Q breakfast  . tadalafil (PAH)  40 mg Oral Daily    Continuous Infusions:   PRN Meds: hydrOXYzine, loperamide, ondansetron **OR** ondansetron (ZOFRAN) IV, traMADol  Physical Exam Vitals and nursing note reviewed.  Constitutional:      General: She is awake.     Appearance: She is ill-appearing.  HENT:     Head: Normocephalic and atraumatic.  Cardiovascular:     Rate and Rhythm: Normal rate.  Pulmonary:     Effort: No tachypnea, accessory muscle usage or respiratory distress.     Comments: 3L [place] Skin:    General: Skin is warm and dry.  Neurological:     Mental Status: She is alert and oriented to person, place, and time.  Psychiatric:        Mood and Affect: Mood normal.        Speech: Speech normal.        Behavior: Behavior normal.        Cognition and Memory: Cognition normal.            Vital Signs: BP (!) 110/40 (BP Location: Left Arm)   Pulse 65   Temp 97.7 F (36.5 C) (Oral)   Resp 20   Ht _0  (1.6 m)   Wt 60.5 kg   SpO2 95%   BMI 23.61 kg/m  SpO2: SpO2: 95 % O2 Device: O2 Device: High Flow Nasal Cannula O2 Flow Rate: O2 Flow Rate (L/min): 3 L/min  Intake/output summary:   Intake/Output Summary (Last 24 hours) at 03/01/2020 1122 Last data filed at 03/01/2020 0900 Gross per 24 hour  Intake 458 ml  Output --  Net 458 ml   LBM: Last BM Date: 02/29/20 Baseline Weight: Weight: 61.2 kg Most recent  weight: Weight: 60.5 kg       Palliative Assessment/Data: PPS 50%    Flowsheet Rows     Most Recent Value  Intake Tab  Referral Department Hospitalist  Unit  at Time of Referral Intermediate Care Unit  Palliative Care Primary Diagnosis Other (Comment)  [cardiopulmonary]  Date Notified 02/21/20  Palliative Care Type New Palliative care  Reason for referral Clarify Goals of Care  Date of Admission 02/19/20  Date first seen by Palliative Care 02/23/20  # of days Palliative referral response time 2 Day(s)  # of days IP prior to Palliative referral 2  Clinical Assessment  Palliative Performance Scale Score 50%  Psychosocial & Spiritual Assessment  Palliative Care Outcomes  Patient/Family meeting held? Yes  Who was at the meeting? patient, husband, daughters [name] and [name] on speakerphone)  Palliative Care Outcomes Clarified goals of care, Provided psychosocial or spiritual support, ACP counseling assistance      Patient Active Problem List   Diagnosis Date Noted  . Palliative care by specialist   . Positive blood cultures 02/20/2020  . Community acquired pneumonia 02/19/2020  . New onset atrial fibrillation ([place]) 02/19/2020  . Atrial fibrillation with RVR ([place])   . Chronic respiratory failure with hypoxia and hypercapnia ([employer]) 07/08/2017  . Pleural effusion, bilateral 07/08/2017  . Chronic respiratory failure with hypoxia ([place]) 10/16/2015  . Hepatic cirrhosis ([place]) 10/15/2015  . Pulmonary artery hypertension ([place])   . Acute on chronic diastolic congestive heart failure ([place])   . Acute on chronic respiratory failure with hypoxia ([place])   . COPD GOLD II criteria but 02 dep 24/7    . Severe Pulmonary Hypertension   . Pericardial effusion   . Diastolic dysfunction   . Congestive heart failure (CHF) ([place]) 10/01/2015  . Goals of care, counseling/discussion 05/03/2014  . Mild diastolic dysfunction 40/97/3532  . Acute respiratory failure with hypoxia and hypercapnea 02/26/2014  . HTN (hypertension) 03/19/2012  . Allergic rhinitis 01/15/2007    Palliative Care Assessment & Plan   Patient Profile: 74 y.o. female  with past medical  history of pulmonary hypertension, hyperlipidemia, chronic respiratory failure requiring chronic 3L O2, COPD, CHF, and emphysema admitted on 02/19/2020 with acute on chronic respiratory failure with hypoxia secondary to community acquired pneumonia, pericardial effusion,  bilateral pleural effusions, and new onset a.fib. PMT was consulted for [place] discussions.   Assessment: Pulmonary hypertension  Chronic diastolic heart failure Paroxysmal atrial fibrillation Pericardial effusion without evidence of tamponade CAP Hypotension Deconditioning  Recommendations/Plan:  Continue current plan of care and medical management.  Patient/family concerned with discharge plan home with home health. PT re-evaluation today. Patient would likely benefit from SNF rehab and is willing to discharge to SNF rehab in hopes of getting stronger and back home.   Ongoing palliative discussions pending progression at rehab. Outpatient palliative referral.   Patient/family considering MOST form completion prior to discharge. Patient seems to be leaning towards DNR status but wishes to further discuss with her family.   PMT will follow inpatient.   Goals of Care and Additional Recommendations:  Limitations on Scope of Treatment: Full Scope Treatment  Code Status: FULL   Code Status Orders  (From admission, onward)         Start     Ordered   02/19/20 1251  Full code  Continuous        02/19/20 1253        Code Status History    Date Active Date Inactive Code Status Order ID  Comments User Context   08/12/2017 0938 08/12/2017 1405 Full Code [id number]  Jolaine Artist, MD Inpatient   01/26/2017 1025 01/26/2017 1616 Full Code [id number]  Jolaine Artist, MD Inpatient   10/08/2015 1436 10/09/2015 1551 Full Code [id number]  Jolaine Artist, MD Inpatient   10/02/2015 0033 10/08/2015 1436 Full Code [id number]  Reubin Milan, MD ED   02/22/2014 1642 02/27/2014 1619 Full Code [id number]  Rama, Venetia Maxon,  MD Inpatient   Advance Care Planning Activity    Advance Directive Documentation     Most Recent Value  Type of Advance Directive Healthcare Power of Attorney, Living will  Pre-existing out of facility DNR order (yellow form or pink MOST form) --  "MOST" Form in Place? --       Prognosis:   Unable to determine  Discharge Planning:  [place] for rehab with Palliative care service follow-up  Care plan was discussed with patient, husband, daughters, RN, Dr. Rodena Piety  Thank you for allowing the Palliative Medicine Team to assist in the care of this patient.   Time In: 0815- 1050- Time Out: 0830 1130 Total Time 55 Prolonged Time Billed  no      Greater than 50%  of this time was spent counseling and coordinating care related to the above assessment and plan.  Ihor Dow, DNP, FNP-C Palliative Medicine Team  Phone: [phone] Fax: [phone]  Please contact Palliative Medicine Team phone at [phone] for questions and concerns.

## 2020-03-01 NOTE — Progress Notes (Signed)
Spoke to [employer] for [employer] with Palliative care in regards to re-consulting Palliative for Goals of Care update. Pt/patient's spouse communicated pt has stated feeling "ready to go" and is tired of all the medications/chronic pain/illness. Melanie to pass on info to [place].   Also re-consulting PT for a new eval since pt/family are uncomfortable with going home on HH/now considering rehab. Will continue following.

## 2020-03-01 NOTE — Progress Notes (Signed)
PROGRESS NOTE    TAMICKA SHIMON  ZOX:[id number] DOB: Apr 16, 1946 DOA: 02/19/2020 PCP: [name], MD   Brief Narrative: 74 year old female with history of COPD/chronic RF on 3 L, diastolic CHF, pulmonary HTN, HTN and HLD presenting with progressive shortness of breath for 1 week. Tried with a course of Z-Pak and a steroid taper by her pulmonologist on 6/9 but continues to have progressive shortness of breath.  In ED, slightly hypotensive. Required 6 L by [place]. WBC 11.4. AST 90. Total bili 1.9. Lactic acid 2.8. BNP 164. Trop negative x2. CXR concerning for cardiomegaly with small pleural effusion and by basilar atelectasis. CTA chest showed sizable pericardial effusion, bilateral pleural effusions and consolidation concerning for pneumonia and pulmonary HTN. Cultures drawn. Received IV Solu-Medrol, morphine, Rocephin and azithromycin,and admitted for acute on chronic respiratory failure due to pneumonia, pleural effusions and pericardial effusion. Cardiology consulted for pericardial effusion. Echo obtained and revealed dilated and fixed IVC although no other features of tamponade.  The next day, blood cultures with GPC's in clusters in 4/4. BICD with coag negative MR staph. Vancomycin added. ID consulted. Azithromycin and ceftriaxone discontinued. Repeat blood culture obtained and NGTD. Eventually, vancomycin discontinued as susceptibilities on initial blood culture were different suggesting contamination versus true bacteremia.  Patient went into A. fib with RVR with hypotension on 6/14. Started on amiodarone drip and converted to sinus rhythm. Advanced heart failure team and palliative medicine involved as well.    Assessment & Plan:   Principal Problem:   Positive blood cultures Active Problems:   Goals of care, counseling/discussion   Congestive heart failure (CHF) (HCC)   Pulmonary artery hypertension (HCC)   Acute on chronic diastolic congestive heart  failure (HCC)   Acute on chronic respiratory failure with hypoxia (HCC)   Pericardial effusion   Acute respiratory failure with hypoxia and hypercapnea   Hepatic cirrhosis (HCC)   Pleural effusion, bilateral   Community acquired pneumonia   New onset atrial fibrillation ([place])   Palliative care by specialist  #1 large pericardial effusion without evidence of tamponade.  Repeat echo 02/21/2020 pericardial effusion smaller but still remain large.  Patient was stable hemodynamically.  Seen by cardiology.  There was no indication for pericardiocentesis or window at this time.  Rheumatology work-up has been negative.  Continue colchicine.  #2 pulmonary hypertension on sildenafil and R's.  #3 chronic diastolic heart failure with preserved ejection fraction grade 1 diastolic dysfunction.  Continue Lasix 40 mg daily.  #4 paroxysmal atrial fibrillation initially treated with amiodarone now in sinus rhythm. Cardiology okay to start Eliquis on 02/24/2020.  #5 AKI improved  #6 acute metabolic encephalopathy seems to have resolved. Work-up included ammonia elevated at 52 TSH and B12 were normal.  She was on morphine tramadol and Phenergan which could contribute which has been stopped. She was found to be hypercarbic at 79. Treated with BiPAP.  #7+ blood cultures deemed to be contamination.  ID was consulted did not recommend antibiotics.  Transthoracic echo without vegetation.  #8 community-acquired pneumonia status post IV antibiotics finish the course.  #9 hypotension patient has been having soft blood pressure on Lasix and amiodarone.  Continue and monitor.  #10 goals of care patient's husband and family not comfortable taking her home today.  They would like to consider rehab on discharge.  PT reconsulted.   Estimated body mass index is 23.61 kg/m as calculated from the following:   Height as of this encounter: _0  (1.6 m).   Weight as  of this encounter: 60.5 kg.  DVT  prophylaxis:eliquis Code Status: full Family Communication: Discussed with daughter Disposition Plan:  Status is: Inpatient  Dispo: The patient is from:home              Anticipated d/c is to: Unknown              Anticipated d/c date 03/02/2020              Patient currently Admitted with pericardial effusion, family not comfortable taking her home.  They would like to get PT evaluation again to see if she is a candidate for rehabilitation.  Consultants: palliative care   Procedures:egd 6/22 Antimicrobials: Anti-infectives (From admission, onward)   Start     Dose/Rate Route Frequency Ordered Stop   02/21/20 0600  vancomycin (VANCOCIN) IVPB 1000 mg/200 mL premix  Status:  Discontinued        1,000 mg 200 mL/hr over 60 Minutes Intravenous Every 24 hours 02/20/20 0621 02/23/20 1223   02/20/20 1000  cefTRIAXone (ROCEPHIN) 2 g in sodium chloride 0.9 % 100 mL IVPB  Status:  Discontinued        2 g 200 mL/hr over 30 Minutes Intravenous Every 24 hours 02/19/20 1342 02/20/20 0616   02/20/20 1000  azithromycin (ZITHROMAX) 500 mg in sodium chloride 0.9 % 250 mL IVPB  Status:  Discontinued        500 mg 250 mL/hr over 60 Minutes Intravenous Every 24 hours 02/19/20 1342 02/20/20 1019   02/20/20 1000  cefTRIAXone (ROCEPHIN) 2 g in sodium chloride 0.9 % 100 mL IVPB  Status:  Discontinued        2 g 200 mL/hr over 30 Minutes Intravenous Every 24 hours 02/20/20 0913 02/21/20 0749   02/20/20 0645  vancomycin (VANCOREADY) IVPB 1250 mg/250 mL        1,250 mg 166.7 mL/hr over 90 Minutes Intravenous  Once 02/20/20 0621 02/21/20 1649   02/19/20 1015  cefTRIAXone (ROCEPHIN) 1 g in sodium chloride 0.9 % 100 mL IVPB        1 g 200 mL/hr over 30 Minutes Intravenous  Once 02/19/20 1008 02/19/20 1151   02/19/20 1015  azithromycin (ZITHROMAX) 500 mg in sodium chloride 0.9 % 250 mL IVPB        500 mg 250 mL/hr over 60 Minutes Intravenous  Once 02/19/20 1008 02/19/20 1324      Subjective: Had multiple  episodes of diarrhea yesterday stool softeners stopped no diarrhea today after Imodium.  Objective: Vitals:   03/01/20 0725 03/01/20 0900 03/01/20 0930 03/01/20 1132  BP: (!) 111/52 (!) 108/35 (!) 110/40 (!) 112/44  Pulse: 65   74  Resp: 20     Temp: 97.7 F (36.5 C)   (!) 97.4 F (36.3 C)  TempSrc: Oral   Oral  SpO2: 95%   92%  Weight:      Height:        Intake/Output Summary (Last 24 hours) at 03/01/2020 1222 Last data filed at 03/01/2020 0900 Gross per 24 hour  Intake 458 ml  Output --  Net 458 ml   Filed Weights   02/28/20 0324 02/29/20 0633 03/01/20 0500  Weight: 64.9 kg 61.3 kg 60.5 kg    Examination:  General exam: Appears calm and comfortable  Respiratory system: Clear to auscultation. Respiratory effort normal. Cardiovascular system: S1 & S2 heard, RRR. No JVD, murmurs, rubs, gallops or clicks. No pedal edema. Gastrointestinal system: Abdomen is nondistended, soft and nontender. No organomegaly or  masses felt. Normal bowel sounds heard. Central nervous system: Alert and oriented. No focal neurological deficits. Extremities: Symmetric 5 x 5 power. Skin: No rashes, lesions or ulcers Psychiatry: Judgement and insight appear normal. Mood & affect appropriate.     Data Reviewed: I have personally reviewed following labs and imaging studies  CBC: Recent Labs  Lab 02/25/20 0405 02/26/20 0718 02/29/20 1431  WBC 10.0 11.6* 13.4*  NEUTROABS 8.6*  --   --   HGB 9.2* 10.1* 11.2*  HCT 30.2* 32.3* 36.1  MCV 100.7* 100.0 100.6*  PLT 193 202 888*   Basic Metabolic Panel: Recent Labs  Lab 02/24/20 0433 02/25/20 0405 02/26/20 0718 02/29/20 1431  NA 142 145 142 139  K 3.6 3.5 4.4 4.2  CL 91* 93* 92* 95*  CO2 41* 43* 41* 36*  GLUCOSE 108* 102* 111* 152*  BUN 55* 42* 33* 11  CREATININE 1.32* 1.19* 1.07* 1.03*  CALCIUM 8.5* 8.3* 8.2* 8.0*  MG 3.0* 2.9* 2.6*  --   PHOS 3.7 2.5  --   --    GFR: Estimated Creatinine Clearance: 41.4 mL/min (A) (by C-G formula  based on SCr of 1.03 mg/dL (H)). Liver Function Tests: Recent Labs  Lab 02/24/20 0433 02/25/20 0405 02/26/20 0718 02/29/20 1431  AST  --   --  13* 17  ALT  --   --  27 22  ALKPHOS  --   --  78 78  BILITOT  --   --  0.6 1.0  PROT  --   --  5.5* 5.3*  ALBUMIN 2.6* 2.6* 2.8* 2.8*   No results for input(s): LIPASE, AMYLASE in the last 168 hours. Recent Labs  Lab 02/24/20 0433  AMMONIA 34   Coagulation Profile: No results for input(s): INR, PROTIME in the last 168 hours. Cardiac Enzymes: No results for input(s): CKTOTAL, CKMB, CKMBINDEX, TROPONINI in the last 168 hours. BNP (last 3 results) No results for input(s): PROBNP in the last 8760 hours. HbA1C: No results for input(s): HGBA1C in the last 72 hours. CBG: No results for input(s): GLUCAP in the last 168 hours. Lipid Profile: No results for input(s): CHOL, HDL, LDLCALC, TRIG, CHOLHDL, LDLDIRECT in the last 72 hours. Thyroid Function Tests: No results for input(s): TSH, T4TOTAL, FREET4, T3FREE, THYROIDAB in the last 72 hours. Anemia Panel: No results for input(s): VITAMINB12, FOLATE, FERRITIN, TIBC, IRON, RETICCTPCT in the last 72 hours. Sepsis Labs: No results for input(s): PROCALCITON, LATICACIDVEN in the last 168 hours.  No results found for this or any previous visit (from the past 240 hour(s)).       Radiology Studies: CT ABDOMEN PELVIS W CONTRAST  Result Date: 02/29/2020 CLINICAL DATA:  Epigastric and left upper quadrant abdominal pain. EXAM: CT ABDOMEN AND PELVIS WITH CONTRAST TECHNIQUE: Multidetector CT imaging of the abdomen and pelvis was performed using the standard protocol following bolus administration of intravenous contrast. CONTRAST:  153m OMNIPAQUE IOHEXOL 300 MG/ML  SOLN COMPARISON:  Included portion from chest CT 02/19/2020, no prior abdominal imaging available. FINDINGS: Lower chest: Diminished pericardial effusion from prior exam, small residual. Small bilateral pleural effusions, enhancing and  partially loculated on the right. Adjacent bilateral lower lobe airspace opacities. Hepatobiliary: No focal liver lesion is seen. Equivocal lobulated hepatic contours. Clips in the gallbladder fossa postcholecystectomy. No biliary dilatation. Pancreas: No ductal dilatation or inflammation. Spleen: Normal in size without focal abnormality. No splenic infarct. Adrenals/Urinary Tract: No adrenal nodule. No hydronephrosis or perinephric edema. Homogeneous renal enhancement with symmetric excretion on delayed  phase imaging. Urinary bladder is physiologically distended without wall thickening. Stomach/Bowel: The stomach is unremarkable. There is no small bowel obstruction, administered enteric contrast reaches the colon. No small bowel inflammation. Normal appendix. Left colonic diverticulosis without diverticulitis. There is no colonic wall thickening or inflammatory change. Vascular/Lymphatic: Aorto bi-iliac atherosclerosis. No aortic aneurysm. Patent portal vein. No adenopathy. Reproductive: Uterus and bilateral adnexa are unremarkable. Other: Small volume abdominopelvic free fluid, and both upper quadrants, right pericolic gutter, and in the pelvis. No free air. No focal fluid collection in the abdomen. Small umbilical hernia. Subcutaneous edema in bilateral flanks. Musculoskeletal: There are no acute or suspicious osseous abnormalities. IMPRESSION: 1. No acute abnormality in the abdomen/pelvis. 2. Small volume abdominopelvic free fluid/ascites. 3. Small bilateral pleural effusions, enhancing and partially loculated on the right. Adjacent bilateral lower lobe airspace opacities. Similar appearance was seen on recent chest CTA. Pericardial effusion has diminished. 4. Colonic diverticulosis without diverticulitis. 5. Equivocal lobulated hepatic contours, can be seen with cirrhosis. Recommend correlation with cirrhosis risk factors. Aortic Atherosclerosis (ICD10-I70.0). Electronically Signed   By: Keith Rake M.D.    On: 02/29/2020 00:20        Scheduled Meds: . acetaminophen  650 mg Oral Q8H  . acidophilus  1 capsule Oral Daily  . ambrisentan  10 mg Oral Daily  . amiodarone  200 mg Oral Daily  . apixaban  5 mg Oral BID  . aspirin EC  81 mg Oral Daily  . colchicine  0.3 mg Oral BID  . fluticasone  2 spray Each Nare Daily  . umeclidinium bromide  2 puff Inhalation Daily   And  . fluticasone furoate-vilanterol  2 puff Inhalation Daily  . furosemide  40 mg Oral Daily  . guaiFENesin  600 mg Oral BID  . lidocaine  1 patch Transdermal Q24H  . nystatin  5 mL Oral QID  . pantoprazole  40 mg Oral Q1200  . predniSONE  40 mg Oral Q breakfast   Followed by  . [START ON 03/03/2020] predniSONE  30 mg Oral Q breakfast   Followed by  . [START ON 03/08/2020] predniSONE  20 mg Oral Q breakfast   Followed by  . [START ON 03/13/2020] predniSONE  10 mg Oral Q breakfast   Followed by  . [START ON 03/18/2020] predniSONE  5 mg Oral Q breakfast  . tadalafil (PAH)  40 mg Oral Daily   Continuous Infusions:   LOS: 11 days       Georgette Shell, MD  03/01/2020, 12:22 PM

## 2020-03-02 NOTE — Progress Notes (Signed)
Occupational Therapy Treatment Patient Details Name: Linda Stevens MRN: [id number] DOB: May 30, 1946 Today's Date: 03/02/2020    History of present illness Pt adm with acute hypoxic respiratory failure and large pericardial effusion and PNA. PMH - diastolic heart failure, pulmonary htn, copd, htn   OT comments  Pt received sitting upright in bed with HOB elevated and daughter present at bedside. Pt did not c/o of pain just slight fatigue. Pt willing and eager to participate in therapy today. Daughter encouraging and motivating at bedside. Other daughter present via Facetime. Pt was Mod I to transition to EOB using bed railings and v/c's for hand placement. SPO2 monitored throughout tx session. Pt on 3.5-4L of O2 via [place] upon arrival, registering 94% initially, dropping to 90% at EOB, and dropping to 85% after performing grooming activities at sink. Pt did not c/o light headedness or dizziness. Instructed pt to incorporate deep breathing into grooming activities and functional mobility. Pt required min guard for functional transfers and ambulation to/from sink using RW for support. Pt brushed teeth and washed hands/face at sink independently with min guard for balance/safety. Pt brushed hair sitting EOB with min assist for reaching dorsal side of head. Pt returned to supine with HOB elevated with Mod I. Pt proud of her progress and daughters very encouraging/grateful at bedside. Recommend HHOT with 24/7 supervision/assistance as needed. Will continue to follow pt acutely as able.    Follow Up Recommendations  Home health OT;Supervision/Assistance - 24 hour    Equipment Recommendations  Tub/shower seat    Recommendations for Other Services      Precautions / Restrictions Precautions Precautions: Other (comment) Precaution Comments: watch SpO2 Restrictions Weight Bearing Restrictions: No       Mobility Bed Mobility Overal bed mobility: Modified Independent Bed Mobility: Supine to Sit      Supine to sit: HOB elevated;Modified independent (Device/Increase time)     General bed mobility comments: v/c's for correct hand placement on bed railings  Transfers Overall transfer level: Needs assistance Equipment used: Rolling walker (2 wheeled) Transfers: Sit to/from Stand Sit to Stand: Min guard         General transfer comment: min guard for safety; utilized RW for support; v/c's for hand placement for power up    Balance Overall balance assessment: Needs assistance Sitting-balance support: No upper extremity supported;Feet supported Sitting balance-Leahy Scale: Good Sitting balance - Comments: maintained SPO2 in low 90s sitting EOB on 4L via [place] and v/c's for deep breathing   Standing balance support: Bilateral upper extremity supported;During functional activity Standing balance-Leahy Scale: Fair Standing balance comment: requires light bilateral UE support on RW                           ADL either performed or assessed with clinical judgement   ADL Overall ADL's : Needs assistance/impaired     Grooming: Wash/dry hands;Wash/dry face;Oral care;Brushing hair;Min guard;Sitting;Standing Grooming Details (indicate cue type and reason): pt stood at sink to wash hands/face and brush teeth with min guard for balance using extended time and RW for support; pt brushed hair sitting EOB requiring min assist to reach the dorsal side of her head                             Functional mobility during ADLs: Min guard;Rolling walker General ADL Comments: pt ambulated from bed>sink and back with min guard using RW for support and  line mangement assistance; incorporated deep breathing throughout     Vision       Perception     Praxis      Cognition Arousal/Alertness: Awake/alert Behavior During Therapy: Anxious Overall Cognitive Status: Within Functional Limits for tasks assessed                                 General Comments:  pt continues to be anxious but is more willing to work with therapy today         Exercises     Shoulder Instructions       General Comments pt on 3.5-4L of O2 via [place] upon arrival; began at 94% sitting upright in bed; registered at 90% sitting EOB; registered at 85% after standing at sink for grooming; took 1.5 minutes to recover to low 90s sitting EOB with deep breathing    Pertinent Vitals/ Pain       Pain Assessment: No/denies pain  Home Living                                          Prior Functioning/Environment              Frequency  Min 2X/week        Progress Toward Goals  OT Goals(current goals can now be found in the care plan section)  Progress towards OT goals: Progressing toward goals  Acute Rehab OT Goals Patient Stated Goal: return home ADL Goals Pt Will Perform Grooming: with supervision;sitting;standing Pt Will Perform Lower Body Bathing: with supervision;sitting/lateral leans;sit to/from stand Pt Will Perform Lower Body Dressing: with supervision;sit to/from stand;sitting/lateral leans Pt Will Transfer to Toilet: with supervision;ambulating Pt Will Perform Toileting - Clothing Manipulation and hygiene: with supervision;sit to/from stand;sitting/lateral leans Additional ADL Goal #1: Pt will independently verbalize/demonstrate at least 3 energy conservation techniques during ADL/functional task.  Plan Discharge plan remains appropriate    Co-evaluation                 AM-PAC OT "6 Clicks" Daily Activity     Outcome Measure   Help from another person eating meals?: None Help from another person taking care of personal grooming?: A Little Help from another person toileting, which includes using toliet, bedpan, or urinal?: A Lot Help from another person bathing (including washing, rinsing, drying)?: A Lot Help from another person to put on and taking off regular upper body clothing?: A Little Help from another person to  put on and taking off regular lower body clothing?: A Lot 6 Click Score: 16    End of Session Equipment Utilized During Treatment: Gait belt;Oxygen;Rolling walker  OT Visit Diagnosis: Muscle weakness (generalized) (M62.81);Unsteadiness on feet (R26.81);Other (comment)   Activity Tolerance Patient tolerated treatment well   Patient Left in bed;with call bell/phone within reach;with nursing/sitter in room;with family/visitor present   Nurse Communication Mobility status        Time: 7493-5521 OT Time Calculation (min): 31 min  Charges: OT General Charges $OT Visit: 1 Visit OT Treatments $Self Care/Home Management : 23-37 mins  Michel Bickers, OTR/L [facility] [phone]   Francesca Jewett 03/02/2020, 7:14 PM

## 2020-03-02 NOTE — TOC Initial Note (Addendum)
Transition of Care [facility]) - Initial/Assessment Note    Patient Details  Name: Linda Stevens MRN: [id number] Date of Birth: May 16, 1946  Transition of Care [facility]) CM/SW Contact:    Jacquelynn Cree Phone Number: 03/02/2020, 11:54 AM  Clinical Narrative:                 After meeting with Palliative, patient and family expressed interested in SNF placement at discharge. Spouse expressed preference for [facility] and permission was provided to fax referrals to SNFs. Does not want [facility] or [facility]. CSW attempted to contact patient's spouse Linda Stevens to answer any additional questions and was unsuccessful in contact. CSW contact patient's daughter Linda Stevens and explained insurance authorization process and answered additoinal questions. Daughter agreed to updated spouse and would like information on SNF visitation policies. No further questions expressed at this time.  Update: CSW spoke with patient's husband and provided current bed choices and in-network options. Preferred SNFs are not in patient's insurance network. Spouse expressed he would like to discuss with his daughter before making a choice. CSW informed spouse [facility] team will follow-up Saturday for bed choice, explaining SNF needs to start insurance authorization as soon as possible. Spouse expressed no additional questions at this time.  Expected Discharge Plan: Skilled Nursing Facility Barriers to Discharge: Insurance Authorization, Continued Medical Work up   Patient Goals and CMS Choice Patient states their goals for this hospitalization and ongoing recovery are:: per husband to get better and go home CMS Medicare.gov Compare Post Acute Care list provided to:: Patient Choice offered to / list presented to : Patient  Expected Discharge Plan and Services Expected Discharge Plan: [place]   Discharge Planning Services: CM Consult Post Acute Care Choice: Durable Medical Equipment, Home Health Living  arrangements for the past 2 months: Single Family Home                           HH Arranged: PT, OT [facility] Agency: [facility] Date [place]: 02/29/20 Time HH Agency Contacted: 52 Representative spoke with at [place]: [place] Arrangements/Services Living arrangements for the past 2 months: [place] with:: Spouse Patient language and need for interpreter reviewed:: Yes Do you feel safe going back to the place where you live?: Yes      Need for Family Participation in Patient Care: No (Comment) Care giver support system in place?: Yes (comment)   Criminal Activity/Legal Involvement Pertinent to Current Situation/Hospitalization: No - Comment as needed  Activities of Daily Living Home Assistive Devices/Equipment: None ADL Screening (condition at time of admission) Patient's cognitive ability adequate to safely complete daily activities?: Yes Is the patient deaf or have difficulty hearing?: No Does the patient have difficulty seeing, even when wearing glasses/contacts?: No Does the patient have difficulty concentrating, remembering, or making decisions?: No Patient able to express need for assistance with ADLs?: Yes Does the patient have difficulty dressing or bathing?: No Independently performs ADLs?: Yes (appropriate for developmental age) Does the patient have difficulty walking or climbing stairs?: No Weakness of Legs: Both Weakness of Arms/Hands: None  Permission Sought/Granted Permission sought to share information with : Facility Sport and exercise psychologist, Family Supports Permission granted to share information with : Yes, Verbal Permission Granted  Share Information with NAME: Linda Stevens  Permission granted to share info w AGENCY: SNFs  Permission granted to share info w Relationship: Spouse  Permission granted to share info w Contact  Information: [phone]  Emotional Assessment   Attitude/Demeanor/Rapport: Unable  to Assess Affect (typically observed): Unable to Assess Orientation: : Oriented to Self, Oriented to Place, Oriented to  Time, Oriented to Situation Alcohol / Substance Use: Not Applicable Psych Involvement: No (comment)  Admission diagnosis:  Pericardial effusion [I31.3] Community acquired pneumonia [J18.9] Acute respiratory failure with hypoxia (HCC) [J96.01] Atrial fibrillation with RVR ([place]) [I48.91] Community acquired pneumonia, unspecified laterality [J18.9] Patient Active Problem List   Diagnosis Date Noted  . Palliative care by specialist   . Positive blood cultures 02/20/2020  . Community acquired pneumonia 02/19/2020  . New onset atrial fibrillation ([place]) 02/19/2020  . Atrial fibrillation with RVR ([place])   . Chronic respiratory failure with hypoxia and hypercapnia ([place]) 07/08/2017  . Pleural effusion, bilateral 07/08/2017  . Chronic respiratory failure with hypoxia ([place]) 10/16/2015  . Hepatic cirrhosis ([place]) 10/15/2015  . Pulmonary artery hypertension ([place])   . Acute on chronic diastolic congestive heart failure ([place])   . Acute on chronic respiratory failure with hypoxia ([place])   . COPD GOLD II criteria but 02 dep 24/7    . Severe Pulmonary Hypertension   . Pericardial effusion   . Diastolic dysfunction   . Congestive heart failure (CHF) ([place]) 10/01/2015  . Goals of care, counseling/discussion 05/03/2014  . Mild diastolic dysfunction 84/16/6063  . Acute respiratory failure with hypoxia and hypercapnea 02/26/2014  . HTN (hypertension) 03/19/2012  . Allergic rhinitis 01/15/2007   PCP:  [name], MD Pharmacy:   [id number] [place] [place]Phone: [phone]: [phone] [place]Phone: [phone]: [phone] MZacarias PontesTransitions of [place]art [place]- [address]Phone: [phone]: [phone]    Social Determinants of Health (SDOH) Interventions    Readmission Risk Interventions No flowsheet data found.

## 2020-03-02 NOTE — NC FL2 (Signed)
[employer] LEVEL OF CARE SCREENING TOOL     IDENTIFICATION  Patient Name: Linda Stevens Birthdate: 01-Apr-1946 Sex: female Admission Date (Current Location): 02/19/2020  [facility] and [place] Number:  Herbalist and Address:  [facility] [place], [place] [address], [place], [place] [address]      Provider Number: [id number]  Attending Physician Name and Address:  Yaakov Guthrie, MD  Relative Name and Phone Number:  [name]    Current Level of Care: Hospital Recommended Level of Care: [place] Prior Approval Number:    Date Approved/Denied:   PASRR Number: [id number]  Discharge Plan: SNF    Current Diagnoses: Patient Active Problem List   Diagnosis Date Noted  . Palliative care by specialist   . Positive blood cultures 02/20/2020  . Community acquired pneumonia 02/19/2020  . New onset atrial fibrillation ([place]) 02/19/2020  . Atrial fibrillation with RVR ([place])   . Chronic respiratory failure with hypoxia and hypercapnia ([place]) 07/08/2017  . Pleural effusion, bilateral 07/08/2017  . Chronic respiratory failure with hypoxia ([place]) 10/16/2015  . Hepatic cirrhosis ([place]) 10/15/2015  . Pulmonary artery hypertension ([place])   . Acute on chronic diastolic congestive heart failure ([place])   . Acute on chronic respiratory failure with hypoxia ([place])   . COPD GOLD II criteria but 02 dep 24/7    . Severe Pulmonary Hypertension   . Pericardial effusion   . Diastolic dysfunction   . Congestive heart failure (CHF) ([place]) 10/01/2015  . Goals of care, counseling/discussion 05/03/2014  . Mild diastolic dysfunction 10/62/6948  . Acute respiratory failure with hypoxia and hypercapnea 02/26/2014  . HTN (hypertension) 03/19/2012  . Allergic rhinitis 01/15/2007    Orientation RESPIRATION BLADDER Height & Weight     Self, Time, Situation, Place  Normal Continent, External catheter Weight: 132 lb 4.4 oz (60 kg) Height:  5' 3"  (160 cm)  BEHAVIORAL SYMPTOMS/MOOD NEUROLOGICAL BOWEL NUTRITION STATUS      Continent Diet (See discharge summary)  AMBULATORY STATUS COMMUNICATION OF NEEDS Skin   Limited Assist Verbally Normal                       Personal Care Assistance Level of Assistance  Bathing, Feeding, Dressing Bathing Assistance: Limited assistance Feeding assistance: Independent Dressing Assistance: Limited assistance     Functional Limitations Info  Sight, Hearing, Speech Sight Info: Adequate Hearing Info: Adequate Speech Info: Adequate    SPECIAL CARE FACTORS FREQUENCY  PT (By licensed PT), OT (By licensed OT)     PT Frequency: 5x a week OT Frequency: 5x a week            Contractures Contractures Info: Not present    Additional Factors Info  Code Status, Allergies Code Status Info: FULL Allergies Info: Amoxicillin-pot Clavulanate; Cefdinir; Moxifloxacin; Singulair (montelukast Sodium)           Current Medications (03/02/2020):  This is the current hospital active medication list Current Facility-Administered Medications  Medication Dose Route Frequency Provider Last Rate Last Admin  . acetaminophen (TYLENOL) tablet 650 mg  650 mg Oral Q8H Wendee Beavers T, MD   650 mg at 03/02/20 0626  . acidophilus (RISAQUAD) capsule 1 capsule  1 capsule Oral Daily Fuller Plan A, MD   1 capsule at 03/02/20 0925  . ambrisentan (LETAIRIS) tablet 10 mg  10 mg Oral Daily Fuller Plan A, MD   10 mg at 03/02/20 0924  . amiodarone (PACERONE) tablet  200 mg  200 mg Oral Daily Bensimhon, Shaune Pascal, MD   200 mg at 03/02/20 0926  . apixaban (ELIQUIS) tablet 5 mg  5 mg Oral BID Bensimhon, Shaune Pascal, MD   5 mg at 03/02/20 0925  . aspirin EC tablet 81 mg  81 mg Oral Daily Fuller Plan A, MD   81 mg at 03/02/20 0928  . colchicine tablet 0.3 mg  0.3 mg Oral BID Donnamae Jude, MD   0.3 mg at 03/02/20 3729  . fluticasone (FLONASE) 50 MCG/ACT nasal spray 2 spray  2 spray Each Nare Daily Fuller Plan A, MD    2 spray at 03/02/20 0931  . umeclidinium bromide (INCRUSE ELLIPTA) 62.5 MCG/INH 2 puff  2 puff Inhalation Daily Fuller Plan A, MD   2 puff at 03/02/20 0802   And  . fluticasone furoate-vilanterol (BREO ELLIPTA) 100-25 MCG/INH 2 puff  2 puff Inhalation Daily Fuller Plan A, MD   2 puff at 03/02/20 0803  . furosemide (LASIX) tablet 40 mg  40 mg Oral Daily Clegg, Amy D, NP   40 mg at 03/02/20 0926  . guaiFENesin (MUCINEX) 12 hr tablet 600 mg  600 mg Oral BID Fuller Plan A, MD   600 mg at 03/02/20 0925  . hydrOXYzine (ATARAX/VISTARIL) tablet 10 mg  10 mg Oral BID PRN Donnamae Jude, MD   10 mg at 03/01/20 1934  . lidocaine (LIDODERM) 5 % 1 patch  1 patch Transdermal Q24H Mercy Riding, MD   1 patch at 03/01/20 1349  . loperamide (IMODIUM) capsule 2 mg  2 mg Oral Q6H PRN Georgette Shell, MD   2 mg at 03/02/20 0926  . nystatin (MYCOSTATIN) 100000 UNIT/ML suspension 500,000 Units  5 mL Oral QID Mercy Riding, MD   500,000 Units at 03/02/20 0925  . ondansetron (ZOFRAN) tablet 4 mg  4 mg Oral Q6H PRN Fuller Plan A, MD       Or  . ondansetron (ZOFRAN) injection 4 mg  4 mg Intravenous Q6H PRN Fuller Plan A, MD   4 mg at 02/23/20 2114  . pantoprazole (PROTONIX) EC tablet 40 mg  40 mg Oral Q1200 Bensimhon, Shaune Pascal, MD   40 mg at 03/01/20 1132  . [START ON 03/03/2020] predniSONE (DELTASONE) tablet 30 mg  30 mg Oral Q breakfast Donnamae Jude, MD       Followed by  . [START ON 03/08/2020] predniSONE (DELTASONE) tablet 20 mg  20 mg Oral Q breakfast Donnamae Jude, MD       Followed by  . [START ON 03/13/2020] predniSONE (DELTASONE) tablet 10 mg  10 mg Oral Q breakfast Donnamae Jude, MD       Followed by  . [START ON 03/18/2020] predniSONE (DELTASONE) tablet 5 mg  5 mg Oral Q breakfast Donnamae Jude, MD      . tadalafil [facility]) (ADCIRCA) tablet 40 mg  40 mg Oral Daily Fuller Plan A, MD   40 mg at 03/02/20 0927  . traMADol (ULTRAM) tablet 50 mg  50 mg Oral Q6H PRN Donnamae Jude, MD   50 mg at  02/28/20 2036     Discharge Medications: Please see discharge summary for a list of discharge medications.  Relevant Imaging Results:  Relevant Lab Results:   Additional Information SSN [id number]  [place] J [place], [place]

## 2020-03-02 NOTE — Progress Notes (Signed)
PROGRESS NOTE    Linda Stevens  QMV:[id number] DOB: July 31, 1946 DOA: 02/19/2020 PCP: [name], MD   Brief Narrative: 74 year old female with history of COPD/chronic RF on 3 L, diastolic CHF, pulmonary HTN, HTN and HLD presenting with progressive shortness of breath for 1 week. Tried with a course of Z-Pak and a steroid taper by her pulmonologist on 6/9 but continues to have progressive shortness of breath.  In ED, slightly hypotensive. Required 6 L by [place]. WBC 11.4. AST 90. Total bili 1.9. Lactic acid 2.8. BNP 164. Trop negative x2. CXR concerning for cardiomegaly with small pleural effusion and by basilar atelectasis. CTA chest showed sizable pericardial effusion, bilateral pleural effusions and consolidation concerning for pneumonia and pulmonary HTN. Cultures drawn. Received IV Solu-Medrol, morphine, Rocephin and azithromycin,and admitted for acute on chronic respiratory failure due to pneumonia, pleural effusions and pericardial effusion. Cardiology consulted for pericardial effusion. Echo obtained and revealed dilated and fixed IVC although no other features of tamponade.  The next day, blood cultures with GPC's in clusters in 4/4. BICD with coag negative MR staph. Vancomycin added. ID consulted. Azithromycin and ceftriaxone discontinued. Repeat blood culture obtained and NGTD. Eventually, vancomycin discontinued as susceptibilities on initial blood culture were different suggesting contamination versus true bacteremia.  Patient went into A. fib with RVR with hypotension on 6/14. Started on amiodarone drip and converted to sinus rhythm. Advanced heart failure team and palliative medicine involved as well.   Assessment & Plan:   Principal Problem:   Positive blood cultures Active Problems:   Goals of care, counseling/discussion   Congestive heart failure (CHF) (HCC)   Pulmonary artery hypertension (HCC)   Acute on chronic diastolic congestive heart  failure (HCC)   Acute on chronic respiratory failure with hypoxia (HCC)   Pericardial effusion   Acute respiratory failure with hypoxia and hypercapnea   Hepatic cirrhosis (HCC)   Pleural effusion, bilateral   Community acquired pneumonia   New onset atrial fibrillation ([place])   Palliative care by specialist  #1 large pericardial effusion without evidence of tamponade.  Repeat echo 02/21/2020 pericardial effusion smaller but still remain large.  Patient was stable hemodynamically.  Seen by cardiology.  There was no indication for pericardiocentesis or window at this time.  Rheumatology work-up has been negative.  Continue colchicine for 3 months per cardiology. -They also recommended steroid taper over 3 weeks and repeat echo in 6 weeks.  #2 pulmonary hypertension on sildenafil and letaris  #3 chronic diastolic heart failure with preserved ejection fraction grade 1 diastolic dysfunction.  Continue Lasix 40 mg daily.  #4 paroxysmal atrial fibrillation initially treated with amiodarone now in sinus rhythm. Cardiology okay to start Eliquis on 02/24/2020.  #5 AKI improved  #6 acute metabolic encephalopathy seems to have resolved. Work-up included ammonia elevated at 52. TSH and B12 were normal.  She was on morphine tramadol and Phenergan which could contribute which has been stopped. She was found to be hypercarbic at 79. Treated with BiPAP. -Mental status appears to be stable at this time.  #7+ blood cultures deemed to be contamination.  ID was consulted. Did not recommend antibiotics.  Transthoracic echo without vegetation.  #8 community-acquired pneumonia status post IV antibiotics finished the course.  #9 hypotension patient has been having soft blood pressure on Lasix and amiodarone.  Continue and monitor.  #10 goals of care patient's husband and family not comfortable taking her home.  They would like to consider rehab on discharge.  PT reconsulted.   Estimated  body mass index is  23.43 kg/m as calculated from the following:   Height as of this encounter: _0  (1.6 m).   Weight as of this encounter: 60 kg.  DVT prophylaxis:eliquis Code Status: full Family Communication: No family at bedside.  Unable to reach at this time. Disposition Plan:  Status is: Inpatient  Dispo: The patient is from:home              Anticipated d/c is to: Unknown              Anticipated d/c date unknown at this time, SNF?              Patient currently Admitted with pericardial effusion, family not comfortable taking her home.  They would like to get PT evaluation again to see if she is a candidate for rehabilitation.  Consultants: palliative care   Procedures:egd 6/22 Antimicrobials: Anti-infectives (From admission, onward)   Start     Dose/Rate Route Frequency Ordered Stop   02/21/20 0600  vancomycin (VANCOCIN) IVPB 1000 mg/200 mL premix  Status:  Discontinued        1,000 mg 200 mL/hr over 60 Minutes Intravenous Every 24 hours 02/20/20 0621 02/23/20 1223   02/20/20 1000  cefTRIAXone (ROCEPHIN) 2 g in sodium chloride 0.9 % 100 mL IVPB  Status:  Discontinued        2 g 200 mL/hr over 30 Minutes Intravenous Every 24 hours 02/19/20 1342 02/20/20 0616   02/20/20 1000  azithromycin (ZITHROMAX) 500 mg in sodium chloride 0.9 % 250 mL IVPB  Status:  Discontinued        500 mg 250 mL/hr over 60 Minutes Intravenous Every 24 hours 02/19/20 1342 02/20/20 1019   02/20/20 1000  cefTRIAXone (ROCEPHIN) 2 g in sodium chloride 0.9 % 100 mL IVPB  Status:  Discontinued        2 g 200 mL/hr over 30 Minutes Intravenous Every 24 hours 02/20/20 0913 02/21/20 0749   02/20/20 0645  vancomycin (VANCOREADY) IVPB 1250 mg/250 mL        1,250 mg 166.7 mL/hr over 90 Minutes Intravenous  Once 02/20/20 0621 02/21/20 1649   02/19/20 1015  cefTRIAXone (ROCEPHIN) 1 g in sodium chloride 0.9 % 100 mL IVPB        1 g 200 mL/hr over 30 Minutes Intravenous  Once 02/19/20 1008 02/19/20 1151   02/19/20 1015  azithromycin  (ZITHROMAX) 500 mg in sodium chloride 0.9 % 250 mL IVPB        500 mg 250 mL/hr over 60 Minutes Intravenous  Once 02/19/20 1008 02/19/20 1324      Subjective: She denies having any diarrhea or abdominal pain at this time.  Objective: Vitals:   03/02/20 0829 03/02/20 0922 03/02/20 1111 03/02/20 1700  BP:  (!) 108/44 (!) 116/49 (!) 121/52  Pulse: 70  71 68  Resp: _1 Temp: 97.7 F (36.5 C)  98.3 F (36.8 C) 98.6 F (37 C)  TempSrc: Oral  Oral Oral  SpO2: 96%  96% 96%  Weight:      Height:        Intake/Output Summary (Last 24 hours) at 03/02/2020 1717 Last data filed at 03/02/2020 0700 Gross per 24 hour  Intake 537 ml  Output --  Net 537 ml   Filed Weights   02/29/20 0633 03/01/20 0500 03/02/20 0643  Weight: 61.3 kg 60.5 kg 60 kg    Examination:  General exam: Appears calm and comfortable  Respiratory system: Clear to auscultation. Respiratory effort normal. Cardiovascular system: S1 & S2 heard, RRR. No JVD, murmurs, rubs, gallops or clicks. No pedal edema. Gastrointestinal system: Abdomen is nondistended, soft and nontender. No organomegaly or masses felt. Normal bowel sounds heard. Central nervous system: Alert and oriented. No focal neurological deficits. Extremities: Symmetric 5 x 5 power. Skin: No rashes, lesions or ulcers Psychiatry: Judgement and insight appear normal. Mood & affect appropriate.     Data Reviewed: I have personally reviewed following labs and imaging studies  CBC: Recent Labs  Lab 02/25/20 0405 02/26/20 0718 02/29/20 1431  WBC 10.0 11.6* 13.4*  NEUTROABS 8.6*  --   --   HGB 9.2* 10.1* 11.2*  HCT 30.2* 32.3* 36.1  MCV 100.7* 100.0 100.6*  PLT 193 202 161*   Basic Metabolic Panel: Recent Labs  Lab 02/25/20 0405 02/26/20 0718 02/29/20 1431  NA 145 142 139  K 3.5 4.4 4.2  CL 93* 92* 95*  CO2 43* 41* 36*  GLUCOSE 102* 111* 152*  BUN 42* 33* 11  CREATININE 1.19* 1.07* 1.03*  CALCIUM 8.3* 8.2* 8.0*  MG 2.9* 2.6*  --    PHOS 2.5  --   --    GFR: Estimated Creatinine Clearance: 41.4 mL/min (A) (by C-G formula based on SCr of 1.03 mg/dL (H)). Liver Function Tests: Recent Labs  Lab 02/25/20 0405 02/26/20 0718 02/29/20 1431  AST  --  13* 17  ALT  --  27 22  ALKPHOS  --  78 78  BILITOT  --  0.6 1.0  PROT  --  5.5* 5.3*  ALBUMIN 2.6* 2.8* 2.8*   No results for input(s): LIPASE, AMYLASE in the last 168 hours. No results for input(s): AMMONIA in the last 168 hours. Coagulation Profile: No results for input(s): INR, PROTIME in the last 168 hours. Cardiac Enzymes: No results for input(s): CKTOTAL, CKMB, CKMBINDEX, TROPONINI in the last 168 hours. BNP (last 3 results) No results for input(s): PROBNP in the last 8760 hours. HbA1C: No results for input(s): HGBA1C in the last 72 hours. CBG: No results for input(s): GLUCAP in the last 168 hours. Lipid Profile: No results for input(s): CHOL, HDL, LDLCALC, TRIG, CHOLHDL, LDLDIRECT in the last 72 hours. Thyroid Function Tests: No results for input(s): TSH, T4TOTAL, FREET4, T3FREE, THYROIDAB in the last 72 hours. Anemia Panel: No results for input(s): VITAMINB12, FOLATE, FERRITIN, TIBC, IRON, RETICCTPCT in the last 72 hours. Sepsis Labs: No results for input(s): PROCALCITON, LATICACIDVEN in the last 168 hours.  No results found for this or any previous visit (from the past 240 hour(s)).       Radiology Studies: No results found.      Scheduled Meds:  acetaminophen  650 mg Oral Q8H   acidophilus  1 capsule Oral Daily   ambrisentan  10 mg Oral Daily   amiodarone  200 mg Oral Daily   apixaban  5 mg Oral BID   aspirin EC  81 mg Oral Daily   colchicine  0.3 mg Oral BID   fluticasone  2 spray Each Nare Daily   umeclidinium bromide  2 puff Inhalation Daily   And   fluticasone furoate-vilanterol  2 puff Inhalation Daily   furosemide  40 mg Oral Daily   guaiFENesin  600 mg Oral BID   lidocaine  1 patch Transdermal Q24H    nystatin  5 mL Oral QID   pantoprazole  40 mg Oral Q1200   [START ON 03/03/2020] predniSONE  30 mg Oral  Q breakfast   Followed by   Derrill Memo ON 03/08/2020] predniSONE  20 mg Oral Q breakfast   Followed by   Derrill Memo ON 03/13/2020] predniSONE  10 mg Oral Q breakfast   Followed by   Derrill Memo ON 03/18/2020] predniSONE  5 mg Oral Q breakfast   tadalafil (PAH)  40 mg Oral Daily   Continuous Infusions:   LOS: 12 days    Yaakov Guthrie, MD Pager on amion  03/02/2020, 5:17 PM

## 2020-03-02 NOTE — Progress Notes (Signed)
Daily Progress Note   Patient Name: Linda Stevens       Date: 03/02/2020 DOB: 10-13-1945  Age: 74 y.o. MRN#: [id number] Attending Physician: Yaakov Guthrie, MD Primary Care Physician: [name], MD Admit Date: 02/19/2020  Reason for Consultation/Follow-up: Establishing goals of care  Subjective: Patient awake, alert, oriented. In good spirits this afternoon. Ate about 50% of lunch. Denies pain or discomfort.   No family at bedside.   Wells Guiles and I reviewed her hospitalization and I answered questions regarding her plan of care. She is eager for discharge and is waiting to hear from SW regarding SNF placement. She is very appreciative of staff on [facility] taking excellent care of her. We discussed the 'pink' (MOST) form and she will mention this to her daughters and consider completing prior to discharge.   Length of Stay: 12  Current Medications: Scheduled Meds:  . acetaminophen  650 mg Oral Q8H  . acidophilus  1 capsule Oral Daily  . ambrisentan  10 mg Oral Daily  . amiodarone  200 mg Oral Daily  . apixaban  5 mg Oral BID  . aspirin EC  81 mg Oral Daily  . colchicine  0.3 mg Oral BID  . fluticasone  2 spray Each Nare Daily  . umeclidinium bromide  2 puff Inhalation Daily   And  . fluticasone furoate-vilanterol  2 puff Inhalation Daily  . furosemide  40 mg Oral Daily  . guaiFENesin  600 mg Oral BID  . lidocaine  1 patch Transdermal Q24H  . nystatin  5 mL Oral QID  . pantoprazole  40 mg Oral Q1200  . [START ON 03/03/2020] predniSONE  30 mg Oral Q breakfast   Followed by  . [START ON 03/08/2020] predniSONE  20 mg Oral Q breakfast   Followed by  . [START ON 03/13/2020] predniSONE  10 mg Oral Q breakfast   Followed by  . [START ON 03/18/2020] predniSONE  5 mg Oral Q breakfast  .  tadalafil (PAH)  40 mg Oral Daily    Continuous Infusions:   PRN Meds: hydrOXYzine, loperamide, ondansetron **OR** ondansetron (ZOFRAN) IV, traMADol  Physical Exam Vitals and nursing note reviewed.  Constitutional:      General: She is awake.     Appearance: She is ill-appearing.  HENT:     Head:  Normocephalic and atraumatic.  Cardiovascular:     Rate and Rhythm: Normal rate.  Pulmonary:     Effort: No tachypnea, accessory muscle usage or respiratory distress.     Comments: 3L [place] Skin:    General: Skin is warm and dry.  Neurological:     Mental Status: She is alert and oriented to person, place, and time.  Psychiatric:        Mood and Affect: Mood normal.        Speech: Speech normal.        Behavior: Behavior normal.        Cognition and Memory: Cognition normal.            Vital Signs: BP (!) 116/49 (BP Location: Left Arm)   Pulse 71   Temp 98.3 F (36.8 C) (Oral)   Resp 18   Ht _0  (1.6 m)   Wt 60 kg   SpO2 96%   BMI 23.43 kg/m  SpO2: SpO2: 96 % O2 Device: O2 Device: Nasal Cannula O2 Flow Rate: O2 Flow Rate (L/min): 4 L/min  Intake/output summary:   Intake/Output Summary (Last 24 hours) at 03/02/2020 1633 Last data filed at 03/02/2020 0700 Gross per 24 hour  Intake 537 ml  Output --  Net 537 ml   LBM: Last BM Date: 03/01/20 Baseline Weight: Weight: 61.2 kg Most recent weight: Weight: 60 kg       Palliative Assessment/Data: PPS 50%    Flowsheet Rows     Most Recent Value  Intake Tab  Referral Department Hospitalist  Unit at Time of Referral Intermediate Care Unit  Palliative Care Primary Diagnosis Other (Comment)  [cardiopulmonary]  Date Notified 02/21/20  Palliative Care Type New Palliative care  Reason for referral Clarify Goals of Care  Date of Admission 02/19/20  Date first seen by Palliative Care 02/23/20  # of days Palliative referral response time 2 Day(s)  # of days IP prior to Palliative referral 2  Clinical Assessment   Palliative Performance Scale Score 50%  Psychosocial & Spiritual Assessment  Palliative Care Outcomes  Patient/Family meeting held? Yes  Who was at the meeting? patient, husband, daughters [name] and [name] on speakerphone)  Palliative Care Outcomes Clarified goals of care, Provided psychosocial or spiritual support, ACP counseling assistance      Patient Active Problem List   Diagnosis Date Noted  . Palliative care by specialist   . Positive blood cultures 02/20/2020  . Community acquired pneumonia 02/19/2020  . New onset atrial fibrillation ([place]) 02/19/2020  . Atrial fibrillation with RVR ([place])   . Chronic respiratory failure with hypoxia and hypercapnia ([place]) 07/08/2017  . Pleural effusion, bilateral 07/08/2017  . Chronic respiratory failure with hypoxia ([place]) 10/16/2015  . Hepatic cirrhosis ([place]) 10/15/2015  . Pulmonary artery hypertension ([place])   . Acute on chronic diastolic congestive heart failure ([place])   . Acute on chronic respiratory failure with hypoxia ([place])   . COPD GOLD II criteria but 02 dep 24/7    . Severe Pulmonary Hypertension   . Pericardial effusion   . Diastolic dysfunction   . Congestive heart failure (CHF) ([place]) 10/01/2015  . Goals of care, counseling/discussion 05/03/2014  . Mild diastolic dysfunction 62/94/7654  . Acute respiratory failure with hypoxia and hypercapnea 02/26/2014  . HTN (hypertension) 03/19/2012  . Allergic rhinitis 01/15/2007    Palliative Care Assessment & Plan   Patient Profile: 74 y.o. female  with past medical history of pulmonary hypertension, hyperlipidemia, chronic respiratory failure requiring chronic  3L O2, COPD, CHF, and emphysema admitted on 02/19/2020 with acute on chronic respiratory failure with hypoxia secondary to community acquired pneumonia, pericardial effusion,  bilateral pleural effusions, and new onset a.fib. PMT was consulted for [place] discussions.   Assessment: Pulmonary hypertension  Chronic diastolic heart  failure Paroxysmal atrial fibrillation Pericardial effusion without evidence of tamponade CAP Hypotension Deconditioning  Recommendations/Plan:  Continue current plan of care and medical management.  Pending SNF rehab. TOC team following.   Ongoing palliative discussions pending progression at rehab. Outpatient palliative referral.   Patient/family considering MOST form completion prior to discharge. Patient seems to be leaning towards DNR status but wishes to further discuss with her family.   PMT will follow inpatient.   Goals of Care and Additional Recommendations:  Limitations on Scope of Treatment: Full Scope Treatment  Code Status: FULL   Code Status Orders  (From admission, onward)         Start     Ordered   02/19/20 1251  Full code  Continuous        02/19/20 1253        Code Status History    Date Active Date Inactive Code Status Order ID Comments User Context   08/12/2017 0938 08/12/2017 1405 Full Code [id number]  Jolaine Artist, MD Inpatient   01/26/2017 1025 01/26/2017 1616 Full Code [id number]  Jolaine Artist, MD Inpatient   10/08/2015 1436 10/09/2015 1551 Full Code [id number]  Jolaine Artist, MD Inpatient   10/02/2015 0033 10/08/2015 1436 Full Code [id number]  Reubin Milan, MD ED   02/22/2014 1642 02/27/2014 1619 Full Code [id number]  Rama, Venetia Maxon, MD Inpatient   Advance Care Planning Activity    Advance Directive Documentation     Most Recent Value  Type of Advance Directive Healthcare Power of Attorney, Living will  Pre-existing out of facility DNR order (yellow form or pink MOST form) --  "MOST" Form in Place? --       Prognosis:   Unable to determine  Discharge Planning:  [place] for rehab with Palliative care service follow-up  Care plan was discussed with patient  Thank you for allowing the Palliative Medicine Team to assist in the care of this patient.   Time In: 1150 Time Out: 1205 Total Time 15  Prolonged Time Billed  no    Greater than 50% of this time was spent counseling and coordinating care related to the above assessment and plan.   Ihor Dow, DNP, FNP-C Palliative Medicine Team  Phone: [phone] Fax: [phone]  Please contact Palliative Medicine Team phone at [phone] for questions and concerns.

## 2020-03-03 DIAGNOSIS — Z66 Do not resuscitate: Secondary | ICD-10-CM

## 2020-03-03 LAB — BASIC METABOLIC PANEL
Anion gap: 12 (ref 5–15)
BUN: 13 mg/dL (ref 8–23)
CO2: 31 mmol/L (ref 22–32)
Calcium: 7.9 mg/dL — ABNORMAL LOW (ref 8.9–10.3)
Chloride: 98 mmol/L (ref 98–111)
Creatinine, Ser: 1.09 mg/dL — ABNORMAL HIGH (ref 0.44–1.00)
GFR calc Af Amer: 59 mL/min — ABNORMAL LOW (ref >60)
GFR calc non Af Amer: 51 mL/min — ABNORMAL LOW (ref >60)
Glucose, Bld: 83 mg/dL (ref 70–99)
Potassium: 3.4 mmol/L — ABNORMAL LOW (ref 3.5–5.1)
Sodium: 141 mmol/L (ref 135–145)

## 2020-03-03 LAB — CBC
HCT: 33.9 % — ABNORMAL LOW (ref 36.0–46.0)
Hemoglobin: 10.6 g/dL — ABNORMAL LOW (ref 12.0–15.0)
MCH: 31.2 pg (ref 26.0–34.0)
MCHC: 31.3 g/dL (ref 30.0–36.0)
MCV: 99.7 fL (ref 80.0–100.0)
Platelets: 130 10*3/uL — ABNORMAL LOW (ref 150–400)
RBC: 3.4 MIL/uL — ABNORMAL LOW (ref 3.87–5.11)
RDW: 18.2 % — ABNORMAL HIGH (ref 11.5–15.5)
WBC: 10 10*3/uL (ref 4.0–10.5)
nRBC: 0 % (ref 0.0–0.2)

## 2020-03-03 MED ORDER — MIDODRINE HCL 5 MG PO TABS: 2.5000 mg | ORAL_TABLET | Freq: Three times a day (TID) | ORAL | Status: DC | PRN

## 2020-03-03 MED ORDER — POTASSIUM CHLORIDE CRYS ER 20 MEQ PO TBCR
40.0000 meq | EXTENDED_RELEASE_TABLET | Freq: Once | ORAL | Status: AC
  Administered 2020-03-03: 40 meq via ORAL
  Filled 2020-03-03: qty 2

## 2020-03-03 NOTE — Progress Notes (Signed)
Daily Progress Note   Patient Name: Linda Stevens       Date: 03/03/2020 DOB: 03/31/1946  Age: 74 y.o. MRN#: [id number] Attending Physician: Yaakov Guthrie, MD Primary Care Physician: [name], MD Admit Date: 02/19/2020  Reason for Consultation/Follow-up: Establishing goals of care  Subjective: Patient awake, alert, oriented, in good spirits this afternoon. Denies current pain or dyspnea. Ongoing diarrhea with some relief from prn imodium.   GOC:  F/u GOC with patient, daughter [name]) at bedside, and daughter [name]) on facetime. Patient has discussed her wishes with husband who recently left the hospital. Patient and daughters are ready to complete MOST form today.   Patient's documented living will copy is located in chart to be scanned into EMR. After further discussions about code status, patient shares her wishes against resuscitation/ventilator if critically ill. "Just let me go" when her heart stops and she passes. She does not wish to "be a vegetable." We discussed treating the treatable and continuing medical management is very reasonable, but medical recommendation against heroic measures with her underlying chronic conditions. Patient and daughters agree.   MOST form completed. Patient's wishes include: DNR/DNI, limited additional interventions including re-hospitalization, CPAP/BiPAP if indicated, IVF/ABX if indicated, and feeding tube for a time trial depending on situation and conversations with the care team/family if this would truly be appropriate or beneficial. Electronic [facility] MOST completed. Durable DNR completed. Copies made for chart and family. Daughters understand and respect her EOL wishes. Reassured of ongoing conversations as her conditions progress.  Patient/family agree with outpatient palliative referral for ongoing discussions and support.   Discussed care plan in detail including medications, pending SNF rehab.  Dr. Barth Kirks at bedside during visit and updated on MOST form and DNR/DNI code status. Dr. Barth Kirks to further discuss prn midodrine with HF team as patient/family remained worried about ongoing soft blood pressures.   Answered all questions and concerns. Therapeutic listening. [facility] and PMT contact information given to Angie. Patient is relieved to have this paperwork completed.    Length of Stay: 13  Current Medications: Scheduled Meds:  . acetaminophen  650 mg Oral Q8H  . acidophilus  1 capsule Oral Daily  . ambrisentan  10 mg Oral Daily  . amiodarone  200 mg Oral Daily  . apixaban  5 mg Oral BID  .  aspirin EC  81 mg Oral Daily  . colchicine  0.3 mg Oral BID  . fluticasone  2 spray Each Nare Daily  . umeclidinium bromide  2 puff Inhalation Daily   And  . fluticasone furoate-vilanterol  2 puff Inhalation Daily  . furosemide  40 mg Oral Daily  . guaiFENesin  600 mg Oral BID  . lidocaine  1 patch Transdermal Q24H  . nystatin  5 mL Oral QID  . pantoprazole  40 mg Oral Q1200  . predniSONE  30 mg Oral Q breakfast   Followed by  . [START ON 03/08/2020] predniSONE  20 mg Oral Q breakfast   Followed by  . [START ON 03/13/2020] predniSONE  10 mg Oral Q breakfast   Followed by  . [START ON 03/18/2020] predniSONE  5 mg Oral Q breakfast  . tadalafil (PAH)  40 mg Oral Daily    Continuous Infusions:   PRN Meds: hydrOXYzine, loperamide, midodrine, ondansetron **OR** ondansetron (ZOFRAN) IV, traMADol  Physical Exam Vitals and nursing note reviewed.  Constitutional:      General: She is awake.     Appearance: She is ill-appearing.  HENT:     Head: Normocephalic and atraumatic.  Cardiovascular:     Rate and Rhythm: Normal rate.  Pulmonary:     Effort: No tachypnea, accessory muscle usage or respiratory distress.       Comments: 3L [place] Skin:    General: Skin is warm and dry.  Neurological:     Mental Status: She is alert and oriented to person, place, and time.  Psychiatric:        Mood and Affect: Mood normal.        Speech: Speech normal.        Behavior: Behavior normal.        Cognition and Memory: Cognition normal.            Vital Signs: BP (!) 101/46 (BP Location: Left Arm)   Pulse 67   Temp 98 F (36.7 C) (Oral)   Resp 20   Ht _0  (1.6 m)   Wt 59.2 kg   SpO2 98%   BMI 23.12 kg/m  SpO2: SpO2: 98 % O2 Device: O2 Device: Nasal Cannula O2 Flow Rate: O2 Flow Rate (L/min): 4 L/min  Intake/output summary:  No intake or output data in the 24 hours ending 03/03/20 1421 LBM: Last BM Date: 03/02/20 Baseline Weight: Weight: 61.2 kg Most recent weight: Weight: 59.2 kg       Palliative Assessment/Data: PPS 50%    Flowsheet Rows     Most Recent Value  Intake Tab  Referral Department Hospitalist  Unit at Time of Referral Intermediate Care Unit  Palliative Care Primary Diagnosis Other (Comment)  [cardiopulmonary]  Date Notified 02/21/20  Palliative Care Type New Palliative care  Reason for referral Clarify Goals of Care  Date of Admission 02/19/20  Date first seen by Palliative Care 02/23/20  # of days Palliative referral response time 2 Day(s)  # of days IP prior to Palliative referral 2  Clinical Assessment  Palliative Performance Scale Score 50%  Psychosocial & Spiritual Assessment  Palliative Care Outcomes  Patient/Family meeting held? Yes  Who was at the meeting? patient, husband, daughters [name] and [name] on speakerphone)  Palliative Care Outcomes Clarified goals of care, Provided psychosocial or spiritual support, ACP counseling assistance      Patient Active Problem List   Diagnosis Date Noted  . Palliative care by specialist   . Positive  blood cultures 02/20/2020  . Community acquired pneumonia 02/19/2020  . New onset atrial fibrillation ([place]) 02/19/2020  .  Atrial fibrillation with RVR ([place])   . Chronic respiratory failure with hypoxia and hypercapnia ([place]) 07/08/2017  . Pleural effusion, bilateral 07/08/2017  . Chronic respiratory failure with hypoxia ([place]) 10/16/2015  . Hepatic cirrhosis ([place]) 10/15/2015  . Pulmonary artery hypertension ([place])   . Acute on chronic diastolic congestive heart failure ([place])   . Acute on chronic respiratory failure with hypoxia ([place])   . COPD GOLD II criteria but 02 dep 24/7    . Severe Pulmonary Hypertension   . Pericardial effusion   . Diastolic dysfunction   . Congestive heart failure (CHF) ([place]) 10/01/2015  . Goals of care, counseling/discussion 05/03/2014  . Mild diastolic dysfunction 08/65/7846  . Acute respiratory failure with hypoxia and hypercapnea 02/26/2014  . HTN (hypertension) 03/19/2012  . Allergic rhinitis 01/15/2007    Palliative Care Assessment & Plan   Patient Profile: 74 y.o. female  with past medical history of pulmonary hypertension, hyperlipidemia, chronic respiratory failure requiring chronic 3L O2, COPD, CHF, and emphysema admitted on 02/19/2020 with acute on chronic respiratory failure with hypoxia secondary to community acquired pneumonia, pericardial effusion,  bilateral pleural effusions, and new onset a.fib. PMT was consulted for [place] discussions.   Assessment: Pulmonary hypertension  Chronic diastolic heart failure Paroxysmal atrial fibrillation Pericardial effusion without evidence of tamponade CAP Hypotension Deconditioning Hx of COPD on chronic oxygen  Recommendations/Plan:  Patient and daughters ready to complete MOST form today.   Patient's wishes include: DNR/DNI, limited additional interventions including re-hospitalization, CPAP/BiPAP if indicated, IVF/ABX if indicated, and open to short-term feeding tube but based on conversations with providers and her family depending on clinical condition. Vynca MOST completed and located under ACP tab. Durable DNR completed.  Copies placed in chart and given to family.   Patient's documented living will copy in paper chart.   Continue current plan of care and medical management.   Pending SNF rehab with ultimate goal to return home following her progression at rehab.  Patient family agreeable with outpatient palliative referral for ongoing discussions and support.  Code Status: DNR/DNI   Code Status Orders  (From admission, onward)         Start     Ordered   02/19/20 1251  Full code  Continuous        02/19/20 1253        Code Status History    Date Active Date Inactive Code Status Order ID Comments User Context   08/12/2017 0938 08/12/2017 1405 Full Code [id number]  Jolaine Artist, MD Inpatient   01/26/2017 1025 01/26/2017 1616 Full Code [id number]  Jolaine Artist, MD Inpatient   10/08/2015 1436 10/09/2015 1551 Full Code [id number]  Jolaine Artist, MD Inpatient   10/02/2015 0033 10/08/2015 1436 Full Code [id number]  Reubin Milan, MD ED   02/22/2014 1642 02/27/2014 1619 Full Code [id number]  Rama, Venetia Maxon, MD Inpatient   Advance Care Planning Activity    Advance Directive Documentation     Most Recent Value  Type of Advance Directive Healthcare Power of Attorney, Living will  Pre-existing out of facility DNR order (yellow form or pink MOST form) --  "MOST" Form in Place? --       Prognosis:   Unable to determine  Discharge Planning:  [place] for rehab with Palliative care service follow-up  Care plan was discussed with patient, daughters [name] and [name]), husband,  RN, Dr. Barth Kirks  Thank you for allowing the Palliative Medicine Team to assist in the care of this patient.   Time In: 1200 Time Out: 1310 Total Time 70 Prolonged Time Billed yes    Greater than 50% of this time was spent counseling and coordinating care related to the above assessment and plan.   Ihor Dow, DNP, FNP-C Palliative Medicine Team  Phone: [phone] Fax:  [phone]  Please contact Palliative Medicine Team phone at [phone] for questions and concerns.

## 2020-03-03 NOTE — Progress Notes (Signed)
PROGRESS NOTE    Linda Stevens  LGX:[id number] DOB: Jul 30, 1946 DOA: 02/19/2020 PCP: [name], MD   Brief Narrative: 74 year old female with history of COPD/chronic RF on 3 L, diastolic CHF, pulmonary HTN, HTN and HLD presenting with progressive shortness of breath for 1 week. Tried with a course of Z-Pak and a steroid taper by her pulmonologist on 6/9 but continued to have progressive shortness of breath.  In ED, slightly hypotensive. Required 6 L by [place]. WBC 11.4. AST 90. Total bili 1.9. Lactic acid 2.8. BNP 164. Trop negative x2. CXR concerning for cardiomegaly with small pleural effusion and by basilar atelectasis. CTA chest showed sizable pericardial effusion, bilateral pleural effusions and consolidation concerning for pneumonia and pulmonary HTN. Cultures drawn. Received IV Solu-Medrol, morphine, Rocephin and azithromycin,and admitted for acute on chronic respiratory failure due to pneumonia, pleural effusions and pericardial effusion. Cardiology consulted for pericardial effusion. Echo obtained and revealed dilated and fixed IVC although no other features of tamponade.  The next day, blood cultures with GPC's in clusters in 4/4. BICD with coag negative MR staph. Vancomycin added. ID consulted. Azithromycin and ceftriaxone discontinued. Repeat blood culture obtained and NGTD. Eventually, vancomycin discontinued as susceptibilities on initial blood culture were different suggesting contamination versus true bacteremia.  Patient went into A. fib with RVR with hypotension on 6/14. Started on amiodarone drip and converted to sinus rhythm. Advanced heart failure team and palliative medicine involved as well.   Assessment & Plan:   Principal Problem:   Positive blood cultures Active Problems:   Goals of care, counseling/discussion   Congestive heart failure (CHF) (HCC)   Pulmonary artery hypertension (HCC)   Acute on chronic diastolic congestive heart  failure (HCC)   Acute on chronic respiratory failure with hypoxia (HCC)   Pericardial effusion   Acute respiratory failure with hypoxia and hypercapnea   Hepatic cirrhosis (HCC)   Pleural effusion, bilateral   Community acquired pneumonia   New onset atrial fibrillation ([place])   Palliative care by specialist  #1 large pericardial effusion without evidence of tamponade.  Repeat echo 02/21/2020 pericardial effusion smaller but still remain large.  Patient was stable hemodynamically.  Seen by cardiology.  No indication for pericardiocentesis or window at this time.  Rheumatology work-up has been negative.  Continue colchicine for 3 months per cardiology. -They also recommended steroid taper over 3 weeks and repeat echo in 6 weeks.  #2 pulmonary hypertension on sildenafil and letaris  #3 chronic diastolic heart failure with preserved ejection fraction grade 1 diastolic dysfunction.  Continue Lasix 40 mg daily.  #4 paroxysmal atrial fibrillation initially treated with amiodarone now in sinus rhythm. Cardiology okay to start Eliquis on 02/24/2020.  #5 AKI improved  #6 acute metabolic encephalopathy seems to have resolved. Work-up included ammonia elevated at 52. TSH and B12 were normal.  She was on morphine tramadol and Phenergan which could contribute which has been stopped. She was found to be hypercarbic at 79. Treated with BiPAP. -Mental status appears to be stable at this time.  #7+ blood cultures deemed to be contamination.  ID was consulted. Did not recommend antibiotics.  Transthoracic echo without vegetation.  #8 community-acquired pneumonia status post IV antibiotics finished the course.  #9 hypotension patient has been having soft blood pressure on Lasix and amiodarone.  Continue and monitor.  #10 goals of care patient's husband and family not comfortable taking her home.  They would like to consider rehab on discharge.  PT reconsulted.   Estimated body mass  index is 23.12  kg/m as calculated from the following:   Height as of this encounter: 5' 3" (1.6 m).   Weight as of this encounter: 59.2 kg.  DVT prophylaxis:eliquis Code Status: full Family Communication: Discussed with patient's family at bedside Disposition Plan:  Status is: Inpatient  Dispo: The patient is from:home              Anticipated d/c is to: Unknown              Anticipated d/c date unknown at this time, SNF?              Patient currently Admitted with pericardial effusion, family not comfortable taking her home.  Awaiting SNF Consultants: palliative care   Procedures:egd 6/22 Antimicrobials: Anti-infectives (From admission, onward)   Start     Dose/Rate Route Frequency Ordered Stop   02/21/20 0600  vancomycin (VANCOCIN) IVPB 1000 mg/200 mL premix  Status:  Discontinued        1,000 mg 200 mL/hr over 60 Minutes Intravenous Every 24 hours 02/20/20 0621 02/23/20 1223   02/20/20 1000  cefTRIAXone (ROCEPHIN) 2 g in sodium chloride 0.9 % 100 mL IVPB  Status:  Discontinued        2 g 200 mL/hr over 30 Minutes Intravenous Every 24 hours 02/19/20 1342 02/20/20 0616   02/20/20 1000  azithromycin (ZITHROMAX) 500 mg in sodium chloride 0.9 % 250 mL IVPB  Status:  Discontinued        500 mg 250 mL/hr over 60 Minutes Intravenous Every 24 hours 02/19/20 1342 02/20/20 1019   02/20/20 1000  cefTRIAXone (ROCEPHIN) 2 g in sodium chloride 0.9 % 100 mL IVPB  Status:  Discontinued        2 g 200 mL/hr over 30 Minutes Intravenous Every 24 hours 02/20/20 0913 02/21/20 0749   02/20/20 0645  vancomycin (VANCOREADY) IVPB 1250 mg/250 mL        1,250 mg 166.7 mL/hr over 90 Minutes Intravenous  Once 02/20/20 0621 02/21/20 1649   02/19/20 1015  cefTRIAXone (ROCEPHIN) 1 g in sodium chloride 0.9 % 100 mL IVPB        1 g 200 mL/hr over 30 Minutes Intravenous  Once 02/19/20 1008 02/19/20 1151   02/19/20 1015  azithromycin (ZITHROMAX) 500 mg in sodium chloride 0.9 % 250 mL IVPB        500 mg 250 mL/hr over 60  Minutes Intravenous  Once 02/19/20 1008 02/19/20 1324      Subjective: She denies having any diarrhea or abdominal pain at this time.  Objective: Vitals:   03/03/20 0026 03/03/20 0406 03/03/20 0822 03/03/20 1117  BP: 122/66 (!) 109/46 (!) 108/50 (!) 101/46  Pulse:  67 63 67  Resp: _0 Temp: 98 F (36.7 C) 97.8 F (36.6 C) 97.9 F (36.6 C) 98 F (36.7 C)  TempSrc: Oral Oral Oral Oral  SpO2:  98% 97% 98%  Weight:  59.2 kg    Height:       No intake or output data in the 24 hours ending 03/03/20 1200 Filed Weights   03/01/20 0500 03/02/20 0643 03/03/20 0406  Weight: 60.5 kg 60 kg 59.2 kg    Examination:  General exam: Appears calm and comfortable  Respiratory system: Decreased breath sound lower lobes, clear to auscultation. Respiratory effort normal. Cardiovascular system: S1 & S2 heard, RRR. No JVD, murmurs, rubs, gallops or clicks. No pedal edema. Gastrointestinal system: Abdomen is nondistended, soft and nontender. No  organomegaly or masses felt. Normal bowel sounds heard. Central nervous system: Alert and oriented. No focal neurological deficits. Extremities: Symmetric 5 x 5 power. Skin: No rashes, lesions or ulcers Psychiatry: Judgement and insight appear normal. Mood & affect appropriate.     Data Reviewed: I have personally reviewed following labs and imaging studies  CBC: Recent Labs  Lab 02/26/20 0718 02/29/20 1431 03/03/20 0443  WBC 11.6* 13.4* 10.0  HGB 10.1* 11.2* 10.6*  HCT 32.3* 36.1 33.9*  MCV 100.0 100.6* 99.7  PLT 202 143* 721*   Basic Metabolic Panel: Recent Labs  Lab 02/26/20 0718 02/29/20 1431 03/03/20 0443  NA 142 139 141  K 4.4 4.2 3.4*  CL 92* 95* 98  CO2 41* 36* 31  GLUCOSE 111* 152* 83  BUN 33* 11 13  CREATININE 1.07* 1.03* 1.09*  CALCIUM 8.2* 8.0* 7.9*  MG 2.6*  --   --    GFR: Estimated Creatinine Clearance: 39.2 mL/min (A) (by C-G formula based on SCr of 1.09 mg/dL (H)). Liver Function Tests: Recent Labs    Lab 02/26/20 0718 02/29/20 1431  AST 13* 17  ALT 27 22  ALKPHOS 78 78  BILITOT 0.6 1.0  PROT 5.5* 5.3*  ALBUMIN 2.8* 2.8*   No results for input(s): LIPASE, AMYLASE in the last 168 hours. No results for input(s): AMMONIA in the last 168 hours. Coagulation Profile: No results for input(s): INR, PROTIME in the last 168 hours. Cardiac Enzymes: No results for input(s): CKTOTAL, CKMB, CKMBINDEX, TROPONINI in the last 168 hours. BNP (last 3 results) No results for input(s): PROBNP in the last 8760 hours. HbA1C: No results for input(s): HGBA1C in the last 72 hours. CBG: No results for input(s): GLUCAP in the last 168 hours. Lipid Profile: No results for input(s): CHOL, HDL, LDLCALC, TRIG, CHOLHDL, LDLDIRECT in the last 72 hours. Thyroid Function Tests: No results for input(s): TSH, T4TOTAL, FREET4, T3FREE, THYROIDAB in the last 72 hours. Anemia Panel: No results for input(s): VITAMINB12, FOLATE, FERRITIN, TIBC, IRON, RETICCTPCT in the last 72 hours. Sepsis Labs: No results for input(s): PROCALCITON, LATICACIDVEN in the last 168 hours.  No results found for this or any previous visit (from the past 240 hour(s)).       Radiology Studies: No results found.      Scheduled Meds: . acetaminophen  650 mg Oral Q8H  . acidophilus  1 capsule Oral Daily  . ambrisentan  10 mg Oral Daily  . amiodarone  200 mg Oral Daily  . apixaban  5 mg Oral BID  . aspirin EC  81 mg Oral Daily  . colchicine  0.3 mg Oral BID  . fluticasone  2 spray Each Nare Daily  . umeclidinium bromide  2 puff Inhalation Daily   And  . fluticasone furoate-vilanterol  2 puff Inhalation Daily  . furosemide  40 mg Oral Daily  . guaiFENesin  600 mg Oral BID  . lidocaine  1 patch Transdermal Q24H  . nystatin  5 mL Oral QID  . pantoprazole  40 mg Oral Q1200  . predniSONE  30 mg Oral Q breakfast   Followed by  . [START ON 03/08/2020] predniSONE  20 mg Oral Q breakfast   Followed by  . [START ON 03/13/2020]  predniSONE  10 mg Oral Q breakfast   Followed by  . [START ON 03/18/2020] predniSONE  5 mg Oral Q breakfast  . tadalafil (PAH)  40 mg Oral Daily   Continuous Infusions:   LOS: 13 days  Yaakov Guthrie, MD Pager on amion  03/03/2020, 12:00 PM

## 2020-03-04 LAB — BASIC METABOLIC PANEL
Anion gap: 7 (ref 5–15)
BUN: 13 mg/dL (ref 8–23)
CO2: 34 mmol/L — ABNORMAL HIGH (ref 22–32)
Calcium: 7.8 mg/dL — ABNORMAL LOW (ref 8.9–10.3)
Chloride: 100 mmol/L (ref 98–111)
Creatinine, Ser: 1.12 mg/dL — ABNORMAL HIGH (ref 0.44–1.00)
GFR calc Af Amer: 57 mL/min — ABNORMAL LOW (ref >60)
GFR calc non Af Amer: 49 mL/min — ABNORMAL LOW (ref >60)
Glucose, Bld: 84 mg/dL (ref 70–99)
Potassium: 3.8 mmol/L (ref 3.5–5.1)
Sodium: 141 mmol/L (ref 135–145)

## 2020-03-04 LAB — CBC
HCT: 34.4 % — ABNORMAL LOW (ref 36.0–46.0)
Hemoglobin: 10.7 g/dL — ABNORMAL LOW (ref 12.0–15.0)
MCH: 30.9 pg (ref 26.0–34.0)
MCHC: 31.1 g/dL (ref 30.0–36.0)
MCV: 99.4 fL (ref 80.0–100.0)
Platelets: 119 10*3/uL — ABNORMAL LOW (ref 150–400)
RBC: 3.46 MIL/uL — ABNORMAL LOW (ref 3.87–5.11)
RDW: 18.5 % — ABNORMAL HIGH (ref 11.5–15.5)
WBC: 9 10*3/uL (ref 4.0–10.5)
nRBC: 0 % (ref 0.0–0.2)

## 2020-03-04 NOTE — TOC Progression Note (Signed)
Transition of Care [facility]) - Progression Note    Patient Details  Name: Linda Stevens MRN: [id number] Date of Birth: June 02, 1946  Transition of Care [facility]) CM/SW [place], [place] Phone Number: 03/04/2020, 3:48 PM  Clinical Narrative:    CSW spoke with patient's husband and confirmed SNF choice of [facility]. CSW spoke with [employer] [place] to request insurance authorization be started. CSW also requested PT consult from RN/MD, to provide updated notes to insurance. CSW will follow-up with SNF to follow status of insurance authorization.  Expected Discharge Plan: Skilled Nursing Facility Barriers to Discharge: Ship broker, Continued Medical Work up  Expected Discharge Plan and Services Expected Discharge Plan: [place]   Discharge Planning Services: CM Consult Post Acute Care Choice: Durable Medical Equipment, Home Health Living arrangements for the past 2 months: Single Family Home                           HH Arranged: PT, OT HH Agency: [facility] Date [place]: 02/29/20 Time [place]: 1030 Representative spoke with at [place]: [place] (SDOH) Interventions    Readmission Risk Interventions No flowsheet data found.

## 2020-03-04 NOTE — Progress Notes (Signed)
PROGRESS NOTE    Linda Stevens  ASN:[id number] DOB: March 17, 1946 DOA: 02/19/2020 PCP: [name], MD   Brief Narrative: 74 year old female with history of COPD/chronic RF on 3 L, diastolic CHF, pulmonary HTN, HTN and HLD presenting with progressive shortness of breath for 1 week. Tried with a course of Z-Pak and a steroid taper by her pulmonologist on 6/9 but continued to have progressive shortness of breath.  In ED, slightly hypotensive. Required 6 L by [place]. WBC 11.4. AST 90. Total bili 1.9. Lactic acid 2.8. BNP 164. Trop negative x2. CXR concerning for cardiomegaly with small pleural effusion and by basilar atelectasis. CTA chest showed sizable pericardial effusion, bilateral pleural effusions and consolidation concerning for pneumonia and pulmonary HTN. Cultures drawn. Received IV Solu-Medrol, morphine, Rocephin and azithromycin,and admitted for acute on chronic respiratory failure due to pneumonia, pleural effusions and pericardial effusion. Cardiology consulted for pericardial effusion. Echo obtained and revealed dilated and fixed IVC although no other features of tamponade.  The next day, blood cultures with GPC's in clusters in 4/4. BICD with coag negative MR staph. Vancomycin added. ID consulted. Azithromycin and ceftriaxone discontinued. Repeat blood culture obtained and NGTD. Eventually, vancomycin discontinued as susceptibilities on initial blood culture were different suggesting contamination versus true bacteremia.  Patient went into A. fib with RVR with hypotension on 6/14. Started on amiodarone drip and converted to sinus rhythm. Advanced heart failure team and palliative medicine involved as well.   Assessment & Plan:   Principal Problem:   Positive blood cultures Active Problems:   Goals of care, counseling/discussion   Congestive heart failure (CHF) (HCC)   Pulmonary artery hypertension (HCC)   Acute on chronic diastolic congestive heart  failure (HCC)   Acute on chronic respiratory failure with hypoxia (HCC)   Pericardial effusion   Acute respiratory failure with hypoxia and hypercapnea   Hepatic cirrhosis (HCC)   Pleural effusion, bilateral   Community acquired pneumonia   New onset atrial fibrillation ([place])   Palliative care by specialist   DNR (do not resuscitate)  #1 large pericardial effusion without evidence of tamponade.  Repeat echo 02/21/2020 pericardial effusion smaller but still remain large.  Patient was stable hemodynamically.  Seen by cardiology.  No indication for pericardiocentesis or window at this time.  Rheumatology work-up has been negative.  Continue colchicine for 3 months per cardiology. -They also recommended steroid taper over 3 weeks and repeat echo in 6 weeks.  #2 pulmonary hypertension on sildenafil and letaris  #3 chronic diastolic heart failure with preserved ejection fraction grade 1 diastolic dysfunction.  Continue Lasix 40 mg daily.  #4 paroxysmal atrial fibrillation initially treated with amiodarone now in sinus rhythm. Cardiology okay to start Eliquis on 02/24/2020.  #5  Acute on chronic renal failure: Fluctuating creatinine.  Continue to monitor.  She likely has baseline CKD stage III.  #6 acute metabolic encephalopathy resolved. Work-up included ammonia elevated at 52. TSH and B12 were normal.  She was on morphine tramadol and Phenergan which could contribute which has been stopped. She was found to be hypercarbic at 79. Treated with BiPAP. -Currently on oxygen by nasal cannula.  Mental status appears to be stable at this time.  #7+ blood cultures deemed to be contamination.  ID was consulted. Did not recommend antibiotics.  Transthoracic echo without vegetation.  #8 community-acquired pneumonia status post IV antibiotics finished the course.  #9 hypotension patient has been having soft blood pressure on Lasix and amiodarone.  Continue and monitor.  #10 goals  of care patient's  husband and family not comfortable taking her home.  They would like to consider rehab on discharge.  PT reconsulted.  Awaiting SNF.  #11 thrombocytopenia: Noted to have a gradual decline in platelet count.  This morning 119.  Continue to monitor.   Estimated body mass index is 22.51 kg/m as calculated from the following:   Height as of this encounter: _0  (1.6 m).   Weight as of this encounter: 57.7 kg.  DVT prophylaxis:eliquis Code Status: full Family Communication: Discussed with patient's daughter at bedside Disposition Plan:  Status is: Inpatient  Dispo: The patient is from:home              Anticipated d/c is to: Unknown              Anticipated d/c date unknown at this time.  Awaiting SNF.              Patient currently Admitted with pericardial effusion, family not comfortable taking her home.  Awaiting SNF Consultants: palliative care   Procedures:egd 6/22 Antimicrobials: Anti-infectives (From admission, onward)   Start     Dose/Rate Route Frequency Ordered Stop   02/21/20 0600  vancomycin (VANCOCIN) IVPB 1000 mg/200 mL premix  Status:  Discontinued        1,000 mg 200 mL/hr over 60 Minutes Intravenous Every 24 hours 02/20/20 0621 02/23/20 1223   02/20/20 1000  cefTRIAXone (ROCEPHIN) 2 g in sodium chloride 0.9 % 100 mL IVPB  Status:  Discontinued        2 g 200 mL/hr over 30 Minutes Intravenous Every 24 hours 02/19/20 1342 02/20/20 0616   02/20/20 1000  azithromycin (ZITHROMAX) 500 mg in sodium chloride 0.9 % 250 mL IVPB  Status:  Discontinued        500 mg 250 mL/hr over 60 Minutes Intravenous Every 24 hours 02/19/20 1342 02/20/20 1019   02/20/20 1000  cefTRIAXone (ROCEPHIN) 2 g in sodium chloride 0.9 % 100 mL IVPB  Status:  Discontinued        2 g 200 mL/hr over 30 Minutes Intravenous Every 24 hours 02/20/20 0913 02/21/20 0749   02/20/20 0645  vancomycin (VANCOREADY) IVPB 1250 mg/250 mL        1,250 mg 166.7 mL/hr over 90 Minutes Intravenous  Once 02/20/20 0621  02/21/20 1649   02/19/20 1015  cefTRIAXone (ROCEPHIN) 1 g in sodium chloride 0.9 % 100 mL IVPB        1 g 200 mL/hr over 30 Minutes Intravenous  Once 02/19/20 1008 02/19/20 1151   02/19/20 1015  azithromycin (ZITHROMAX) 500 mg in sodium chloride 0.9 % 250 mL IVPB        500 mg 250 mL/hr over 60 Minutes Intravenous  Once 02/19/20 1008 02/19/20 1324      Subjective: She denies having any major complaints at this time.  Objective: Vitals:   03/04/20 0303 03/04/20 0845 03/04/20 0846 03/04/20 0912  BP: (!) 110/45   (!) 100/44  Pulse:    74  Resp: 18   18  Temp: 98 F (36.7 C)   (!) 97.4 F (36.3 C)  TempSrc: Oral     SpO2:  94% 94% 97%  Weight: 57.7 kg     Height:        Intake/Output Summary (Last 24 hours) at 03/04/2020 0954 Last data filed at 03/03/2020 1300 Gross per 24 hour  Intake 250 ml  Output --  Net 250 ml   [employer]  03/02/20 0643 03/03/20 0406 03/04/20 0303  Weight: 60 kg 59.2 kg 57.7 kg    Examination:  General exam: Appears calm and comfortable  Respiratory system: Decreased breath sound lower lobes, clear to auscultation. Respiratory effort normal. Cardiovascular system: S1 & S2 heard, RRR. No JVD, murmurs, rubs, gallops or clicks. No pedal edema. Gastrointestinal system: Abdomen is nondistended, soft and nontender. No organomegaly or masses felt. Normal bowel sounds heard. Central nervous system: Alert and oriented. No focal neurological deficits. Extremities: Symmetric 5 x 5 power. Skin: No rashes, lesions or ulcers Psychiatry: Judgement and insight appear normal. Mood & affect appropriate.     Data Reviewed: I have personally reviewed following labs and imaging studies  CBC: Recent Labs  Lab 02/29/20 1431 03/03/20 0443 03/04/20 0219  WBC 13.4* 10.0 9.0  HGB 11.2* 10.6* 10.7*  HCT 36.1 33.9* 34.4*  MCV 100.6* 99.7 99.4  PLT 143* 130* 024*   Basic Metabolic Panel: Recent Labs  Lab 02/29/20 1431 03/03/20 0443 03/04/20 0219  NA 139  141 141  K 4.2 3.4* 3.8  CL 95* 98 100  CO2 36* 31 34*  GLUCOSE 152* 83 84  BUN _0 CREATININE 1.03* 1.09* 1.12*  CALCIUM 8.0* 7.9* 7.8*   GFR: Estimated Creatinine Clearance: 38.1 mL/min (A) (by C-G formula based on SCr of 1.12 mg/dL (H)). Liver Function Tests: Recent Labs  Lab 02/29/20 1431  AST 17  ALT 22  ALKPHOS 78  BILITOT 1.0  PROT 5.3*  ALBUMIN 2.8*   No results for input(s): LIPASE, AMYLASE in the last 168 hours. No results for input(s): AMMONIA in the last 168 hours. Coagulation Profile: No results for input(s): INR, PROTIME in the last 168 hours. Cardiac Enzymes: No results for input(s): CKTOTAL, CKMB, CKMBINDEX, TROPONINI in the last 168 hours. BNP (last 3 results) No results for input(s): PROBNP in the last 8760 hours. HbA1C: No results for input(s): HGBA1C in the last 72 hours. CBG: No results for input(s): GLUCAP in the last 168 hours. Lipid Profile: No results for input(s): CHOL, HDL, LDLCALC, TRIG, CHOLHDL, LDLDIRECT in the last 72 hours. Thyroid Function Tests: No results for input(s): TSH, T4TOTAL, FREET4, T3FREE, THYROIDAB in the last 72 hours. Anemia Panel: No results for input(s): VITAMINB12, FOLATE, FERRITIN, TIBC, IRON, RETICCTPCT in the last 72 hours. Sepsis Labs: No results for input(s): PROCALCITON, LATICACIDVEN in the last 168 hours.  No results found for this or any previous visit (from the past 240 hour(s)).    Radiology Studies: No results found.  Scheduled Meds: . acetaminophen  650 mg Oral Q8H  . acidophilus  1 capsule Oral Daily  . ambrisentan  10 mg Oral Daily  . amiodarone  200 mg Oral Daily  . apixaban  5 mg Oral BID  . aspirin EC  81 mg Oral Daily  . colchicine  0.3 mg Oral BID  . fluticasone  2 spray Each Nare Daily  . umeclidinium bromide  2 puff Inhalation Daily   And  . fluticasone furoate-vilanterol  2 puff Inhalation Daily  . furosemide  40 mg Oral Daily  . guaiFENesin  600 mg Oral BID  . lidocaine  1  patch Transdermal Q24H  . nystatin  5 mL Oral QID  . pantoprazole  40 mg Oral Q1200  . predniSONE  30 mg Oral Q breakfast   Followed by  . [START ON 03/08/2020] predniSONE  20 mg Oral Q breakfast   Followed by  . [START ON 03/13/2020] predniSONE  10  mg Oral Q breakfast   Followed by  . [START ON 03/18/2020] predniSONE  5 mg Oral Q breakfast  . tadalafil (PAH)  40 mg Oral Daily   Continuous Infusions:   LOS: 14 days    Yaakov Guthrie, MD Pager on amion  03/04/2020, 9:54 AM

## 2020-03-04 NOTE — Progress Notes (Signed)
OT Cancellation Note  Patient Details Name: Linda Stevens MRN: [id number] DOB: [birth year]   Cancelled Treatment:    Reason Eval/Treat Not Completed: Fatigue/lethargy limiting ability to participate.  Attempted skilled OT treatment session. Pt. Politely declines therapy today secondary to feeling too tired from a reported busy morning.  Reviewed benefits of eob or oob for meals later if she is up for it.  Dtr. Present and also verbalized understanding.   Daiva Huge [name]-COTA/L 03/04/2020, 10:49 AM

## 2020-03-05 LAB — CBC
HCT: 33.5 % — ABNORMAL LOW (ref 36.0–46.0)
Hemoglobin: 10.3 g/dL — ABNORMAL LOW (ref 12.0–15.0)
MCH: 31.1 pg (ref 26.0–34.0)
MCHC: 30.7 g/dL (ref 30.0–36.0)
MCV: 101.2 fL — ABNORMAL HIGH (ref 80.0–100.0)
Platelets: 120 10*3/uL — ABNORMAL LOW (ref 150–400)
RBC: 3.31 MIL/uL — ABNORMAL LOW (ref 3.87–5.11)
RDW: 18.6 % — ABNORMAL HIGH (ref 11.5–15.5)
WBC: 8.6 10*3/uL (ref 4.0–10.5)
nRBC: 0 % (ref 0.0–0.2)

## 2020-03-05 LAB — BASIC METABOLIC PANEL
Anion gap: 9 (ref 5–15)
BUN: 14 mg/dL (ref 8–23)
CO2: 34 mmol/L — ABNORMAL HIGH (ref 22–32)
Calcium: 8 mg/dL — ABNORMAL LOW (ref 8.9–10.3)
Chloride: 99 mmol/L (ref 98–111)
Creatinine, Ser: 1.13 mg/dL — ABNORMAL HIGH (ref 0.44–1.00)
GFR calc Af Amer: 57 mL/min — ABNORMAL LOW (ref >60)
GFR calc non Af Amer: 49 mL/min — ABNORMAL LOW (ref >60)
Glucose, Bld: 84 mg/dL (ref 70–99)
Potassium: 3.6 mmol/L (ref 3.5–5.1)
Sodium: 142 mmol/L (ref 135–145)

## 2020-03-05 LAB — SARS CORONAVIRUS 2 BY RT PCR (HOSPITAL ORDER, PERFORMED IN ~~LOC~~ HOSPITAL LAB): SARS Coronavirus 2: NEGATIVE

## 2020-03-05 NOTE — Progress Notes (Signed)
PROGRESS NOTE    Linda Stevens  WUJ:[id number] DOB: 04-18-1946 DOA: 02/19/2020 PCP: Linda Sanders, MD   Brief Narrative: 74 year old female with history of COPD/chronic RF on 3 L, diastolic CHF, pulmonary HTN, HTN and HLD presenting with progressive shortness of breath for 1 week. Tried with a course of Z-Pak and a steroid taper by her pulmonologist on 6/9 but continued to have progressive shortness of breath.  In ED, slightly hypotensive. Required 6 L by [place]. WBC 11.4. AST 90. Total bili 1.9. Lactic acid 2.8. BNP 164. Trop negative x2. CXR concerning for cardiomegaly with small pleural effusion and by basilar atelectasis. CTA chest showed sizable pericardial effusion, bilateral pleural effusions and consolidation concerning for pneumonia and pulmonary HTN. Cultures drawn. Received IV Solu-Medrol, morphine, Rocephin and azithromycin,and admitted for acute on chronic respiratory failure due to pneumonia, pleural effusions and pericardial effusion. Cardiology consulted for pericardial effusion. Echo obtained and revealed dilated and fixed IVC although no other features of tamponade.  The next day, blood cultures with GPC's in clusters in 4/4. BICD with coag negative MR staph. Vancomycin added. ID consulted. Azithromycin and ceftriaxone discontinued. Repeat blood culture obtained and NGTD. Eventually, vancomycin discontinued as susceptibilities on initial blood culture were different suggesting contamination versus true bacteremia.  Patient went into A. fib with RVR with hypotension on 6/14. Started on amiodarone drip and converted to sinus rhythm. Advanced heart failure team and palliative medicine involved as well.   Assessment & Plan:   Principal Problem:   Positive blood cultures Active Problems:   Goals of care, counseling/discussion   Congestive heart failure (CHF) (HCC)   Pulmonary artery hypertension (HCC)   Acute on chronic diastolic congestive heart  failure (HCC)   Acute on chronic respiratory failure with hypoxia (HCC)   Pericardial effusion   Acute respiratory failure with hypoxia and hypercapnea   Hepatic cirrhosis (HCC)   Pleural effusion, bilateral   Community acquired pneumonia   New onset atrial fibrillation ([place])   Palliative care by specialist   DNR (do not resuscitate)  #1 large pericardial effusion without evidence of tamponade.  Repeat echo 02/21/2020 pericardial effusion smaller but still remain large.  Patient was stable hemodynamically.  Seen by cardiology.  No indication for pericardiocentesis or window at this time.  Rheumatology work-up has been negative.  Continue colchicine for 3 months per cardiology. -They also recommended steroid taper over 3 weeks and repeat echo in 6 weeks.  #2 pulmonary hypertension: Stable at this time. On sildenafil and letaris  #3 chronic diastolic heart failure with preserved ejection fraction grade 1 diastolic dysfunction.  Continue Lasix 40 mg daily.  #4 paroxysmal atrial fibrillation initially treated with amiodarone now in sinus rhythm. Cardiology okay to start Eliquis on 02/24/2020.  #5  Acute on chronic renal failure: Fluctuating creatinine.  Continue to monitor.  She likely has baseline CKD stage III.  #6 acute metabolic encephalopathy resolved. Work-up included ammonia elevated at 52. TSH and B12 were normal.  She was on morphine tramadol and Phenergan which could contribute which has been stopped. She was found to be hypercarbic at 79. Treated with BiPAP. -Currently on oxygen by nasal cannula.  Mental status stable at this time.  #7+ blood cultures deemed to be contamination.  ID was consulted. Did not recommend antibiotics.  Transthoracic echo without vegetation.  #8 community-acquired pneumonia status post IV antibiotics finished the course.  #9 hypotension patient has been having soft blood pressure on Lasix and amiodarone.  Continue and monitor.  #10  goals of care  patient's family not comfortable taking her home.  They would like to consider rehab on discharge.  PT reconsulted.  Awaiting SNF.  #11 thrombocytopenia: Noted to have a gradual decline in platelet count.  This morning 120.  Continue to monitor.   Estimated body mass index is 22.65 kg/m as calculated from the following:   Height as of this encounter: _0  (1.6 m).   Weight as of this encounter: 58 kg.  DVT prophylaxis:eliquis Code Status: full Family Communication: Discussed with patient's daughter at bedside Disposition Plan:  Status is: Inpatient  Dispo: The patient is from:home              Anticipated d/c is to: Unknown              Anticipated d/c date unknown at this time.  Awaiting SNF.              Patient currently admitted with pericardial effusion, family not comfortable taking her home.  Awaiting SNF Consultants: palliative care   Procedures:egd 6/22 Antimicrobials: Anti-infectives (From admission, onward)   Start     Dose/Rate Route Frequency Ordered Stop   02/21/20 0600  vancomycin (VANCOCIN) IVPB 1000 mg/200 mL premix  Status:  Discontinued        1,000 mg 200 mL/hr over 60 Minutes Intravenous Every 24 hours 02/20/20 0621 02/23/20 1223   02/20/20 1000  cefTRIAXone (ROCEPHIN) 2 g in sodium chloride 0.9 % 100 mL IVPB  Status:  Discontinued        2 g 200 mL/hr over 30 Minutes Intravenous Every 24 hours 02/19/20 1342 02/20/20 0616   02/20/20 1000  azithromycin (ZITHROMAX) 500 mg in sodium chloride 0.9 % 250 mL IVPB  Status:  Discontinued        500 mg 250 mL/hr over 60 Minutes Intravenous Every 24 hours 02/19/20 1342 02/20/20 1019   02/20/20 1000  cefTRIAXone (ROCEPHIN) 2 g in sodium chloride 0.9 % 100 mL IVPB  Status:  Discontinued        2 g 200 mL/hr over 30 Minutes Intravenous Every 24 hours 02/20/20 0913 02/21/20 0749   02/20/20 0645  vancomycin (VANCOREADY) IVPB 1250 mg/250 mL        1,250 mg 166.7 mL/hr over 90 Minutes Intravenous  Once 02/20/20 0621  02/21/20 1649   02/19/20 1015  cefTRIAXone (ROCEPHIN) 1 g in sodium chloride 0.9 % 100 mL IVPB        1 g 200 mL/hr over 30 Minutes Intravenous  Once 02/19/20 1008 02/19/20 1151   02/19/20 1015  azithromycin (ZITHROMAX) 500 mg in sodium chloride 0.9 % 250 mL IVPB        500 mg 250 mL/hr over 60 Minutes Intravenous  Once 02/19/20 1008 02/19/20 1324      Subjective: She says she had an episode of loose stool this morning but denies having any abdominal pain.  Stool is not watery and formed per the daughter at the bedside.  Objective: Vitals:   03/05/20 0805 03/05/20 0847 03/05/20 1100 03/05/20 1634  BP: (!) 91/48  (!) 100/45 (!) 118/31  Pulse: 100  76 79  Resp: _1 Temp: 98.4 F (36.9 C)  98.3 F (36.8 C) 98.4 F (36.9 C)  TempSrc: Oral  Oral Oral  SpO2: 100% 97% 100% 95%  Weight:      Height:        Intake/Output Summary (Last 24 hours) at 03/05/2020 1712 Last data filed at 03/05/2020  1100 Gross per 24 hour  Intake 50 ml  Output --  Net 50 ml   Filed Weights   03/03/20 0406 03/04/20 0303 03/05/20 0428  Weight: 59.2 kg 57.7 kg 58 kg    Examination:  General exam: Appears calm and comfortable  Respiratory system: Decreased breath sound lower lobes, clear to auscultation. Respiratory effort normal. Cardiovascular system: S1 & S2 heard, RRR. No JVD, murmurs, rubs, gallops or clicks. No pedal edema. Gastrointestinal system: Abdomen is nondistended, soft and nontender. No masses felt. Normal bowel sounds heard. Central nervous system: Alert and oriented. No focal neurological deficits. Extremities: Symmetric 5 x 5 power. Skin: No rashes, lesions or ulcers Psychiatry: Judgement and insight appear normal. Mood & affect appropriate.     Data Reviewed: I have personally reviewed following labs and imaging studies  CBC: Recent Labs  Lab 02/29/20 1431 03/03/20 0443 03/04/20 0219 03/05/20 0351  WBC 13.4* 10.0 9.0 8.6  HGB 11.2* 10.6* 10.7* 10.3*  HCT 36.1 33.9*  34.4* 33.5*  MCV 100.6* 99.7 99.4 101.2*  PLT 143* 130* 119* 076*   Basic Metabolic Panel: Recent Labs  Lab 02/29/20 1431 03/03/20 0443 03/04/20 0219 03/05/20 0351  NA 139 141 141 142  K 4.2 3.4* 3.8 3.6  CL 95* 98 100 99  CO2 36* 31 34* 34*  GLUCOSE 152* 83 84 84  BUN _0 CREATININE 1.03* 1.09* 1.12* 1.13*  CALCIUM 8.0* 7.9* 7.8* 8.0*   GFR: Estimated Creatinine Clearance: 37.8 mL/min (A) (by C-G formula based on SCr of 1.13 mg/dL (H)). Liver Function Tests: Recent Labs  Lab 02/29/20 1431  AST 17  ALT 22  ALKPHOS 78  BILITOT 1.0  PROT 5.3*  ALBUMIN 2.8*   No results for input(s): LIPASE, AMYLASE in the last 168 hours. No results for input(s): AMMONIA in the last 168 hours. Coagulation Profile: No results for input(s): INR, PROTIME in the last 168 hours. Cardiac Enzymes: No results for input(s): CKTOTAL, CKMB, CKMBINDEX, TROPONINI in the last 168 hours. BNP (last 3 results) No results for input(s): PROBNP in the last 8760 hours. HbA1C: No results for input(s): HGBA1C in the last 72 hours. CBG: No results for input(s): GLUCAP in the last 168 hours. Lipid Profile: No results for input(s): CHOL, HDL, LDLCALC, TRIG, CHOLHDL, LDLDIRECT in the last 72 hours. Thyroid Function Tests: No results for input(s): TSH, T4TOTAL, FREET4, T3FREE, THYROIDAB in the last 72 hours. Anemia Panel: No results for input(s): VITAMINB12, FOLATE, FERRITIN, TIBC, IRON, RETICCTPCT in the last 72 hours. Sepsis Labs: No results for input(s): PROCALCITON, LATICACIDVEN in the last 168 hours.  No results found for this or any previous visit (from the past 240 hour(s)).    Radiology Studies: No results found.  Scheduled Meds: . acetaminophen  650 mg Oral Q8H  . acidophilus  1 capsule Oral Daily  . ambrisentan  10 mg Oral Daily  . amiodarone  200 mg Oral Daily  . apixaban  5 mg Oral BID  . aspirin EC  81 mg Oral Daily  . colchicine  0.3 mg Oral BID  . fluticasone  2 spray Each  Nare Daily  . umeclidinium bromide  2 puff Inhalation Daily   And  . fluticasone furoate-vilanterol  2 puff Inhalation Daily  . furosemide  40 mg Oral Daily  . guaiFENesin  600 mg Oral BID  . lidocaine  1 patch Transdermal Q24H  . nystatin  5 mL Oral QID  . pantoprazole  40 mg Oral Q1200  .  predniSONE  30 mg Oral Q breakfast   Followed by  . [START ON 03/08/2020] predniSONE  20 mg Oral Q breakfast   Followed by  . [START ON 03/13/2020] predniSONE  10 mg Oral Q breakfast   Followed by  . [START ON 03/18/2020] predniSONE  5 mg Oral Q breakfast  . tadalafil (PAH)  40 mg Oral Daily   Continuous Infusions:   LOS: 15 days    Yaakov Guthrie, MD Pager on amion  03/05/2020, 5:12 PM

## 2020-03-05 NOTE — Progress Notes (Signed)
Physical Therapy Treatment Patient Details Name: Linda Stevens MRN: [id number] DOB: 1946-02-17 Today's Date: 03/05/2020    History of Present Illness Pt adm with acute hypoxic respiratory failure and large pericardial effusion and PNA. PMH - diastolic heart failure, pulmonary htn, copd, htn    PT Comments    On entry pt sitting up in bed having just finished breakfast, requesting to use BSC. Pt continues to be limited in safe mobility by anxiety and increased oxygen demand, however she is much more mindful of relaxed deep breathing to maintain oxygenation and decrease anxiety. Pt mobility also limited due to decreased strength and endurance due to deconditioning. Pt is currently mod I for bed mobility and min A for transfers and ambulation of 12 feet with RW. PT continues to recommend SNF placement to work on strength and endurance prior to returning to her home environment. PT will continue to follow acutely.    Follow Up Recommendations  SNF     Equipment Recommendations  Other (comment) (TBD at next venue)       Precautions / Restrictions Precautions Precautions: Other (comment) Precaution Comments: watch SpO2 Restrictions Weight Bearing Restrictions: No    Mobility  Bed Mobility Overal bed mobility: Modified Independent Bed Mobility: Supine to Sit     Supine to sit: HOB elevated;Modified independent (Device/Increase time)     General bed mobility comments: increased time and effort  Transfers Overall transfer level: Needs assistance Equipment used: Rolling walker (2 wheeled);None Transfers: Sit to/from [employer] to Stand: Min guard;Min assist Stand pivot transfers: Min assist       General transfer comment: min A for steadying with sit>stand without AD and for stand pivot to BSC, vc for hand placement, min guard for power up and steadying with RW vc for hand placement   Ambulation/Gait Ambulation/Gait assistance: Min assist Gait  Distance (Feet): 12 Feet Assistive device: Rolling walker (2 wheeled) Gait Pattern/deviations: Step-to pattern;Decreased step length - right;Decreased step length - left Gait velocity: decr Gait velocity interpretation: <1.31 ft/sec, indicative of household ambulator General Gait Details: min A for steadying with ambulation from bed to sink to wash hands and then for ambulation around bed to recliner, one small bout of anxiety however pt able to slow her breath and continue          Balance Overall balance assessment: Needs assistance Sitting-balance support: No upper extremity supported;Feet supported Sitting balance-Leahy Scale: Good     Standing balance support: Bilateral upper extremity supported;During functional activity Standing balance-Leahy Scale: Fair Standing balance comment: requires light bilateral UE support on RW                            Cognition Arousal/Alertness: Awake/alert Behavior During Therapy: Anxious Overall Cognitive Status: Within Functional Limits for tasks assessed                                 General Comments: pt still has some low level anxiety however is much more confident in her mobility         General Comments General comments (skin integrity, edema, etc.): pt on 4L O2 via [place] able to maintain SaO2 >90%O2 with use of BSC and ambulation around room      Pertinent Vitals/Pain Pain Assessment: No/denies pain           PT Goals (current goals can now be found  in the care plan section) Acute Rehab PT Goals Patient Stated Goal: return home PT Goal Formulation: With patient Time For Goal Achievement: 03/07/20 Potential to Achieve Goals: Good Progress towards PT goals: Progressing toward goals    Frequency    Min 2X/week      PT Plan Current plan remains appropriate       AM-PAC PT "6 Clicks" Mobility   Outcome Measure  Help needed turning from your back to your side while in a flat bed without  using bedrails?: None Help needed moving from lying on your back to sitting on the side of a flat bed without using bedrails?: None Help needed moving to and from a bed to a chair (including a wheelchair)?: A Little Help needed standing up from a chair using your arms (e.g., wheelchair or bedside chair)?: A Little Help needed to walk in hospital room?: A Little Help needed climbing 3-5 steps with a railing? : A Lot 6 Click Score: 19    End of Session Equipment Utilized During Treatment: Gait belt;Oxygen Activity Tolerance: Patient tolerated treatment well Patient left: in chair;with call bell/phone within reach Nurse Communication: Mobility status PT Visit Diagnosis: Other abnormalities of gait and mobility (R26.89);Muscle weakness (generalized) (M62.81)     Time: 4431-5400 PT Time Calculation (min) (ACUTE ONLY): 25 min  Charges:  $Gait Training: 8-22 mins $Therapeutic Activity: 8-22 mins                     Shanyla Marconi B. Migdalia Dk PT, DPT Acute Rehabilitation Services Pager [phone] Office [phone]    [place] 03/05/2020, 9:30 AM

## 2020-03-06 LAB — BASIC METABOLIC PANEL
Anion gap: 8 (ref 5–15)
BUN: 14 mg/dL (ref 8–23)
CO2: 34 mmol/L — ABNORMAL HIGH (ref 22–32)
Calcium: 7.9 mg/dL — ABNORMAL LOW (ref 8.9–10.3)
Chloride: 100 mmol/L (ref 98–111)
Creatinine, Ser: 1.14 mg/dL — ABNORMAL HIGH (ref 0.44–1.00)
GFR calc Af Amer: 56 mL/min — ABNORMAL LOW (ref >60)
GFR calc non Af Amer: 48 mL/min — ABNORMAL LOW (ref >60)
Glucose, Bld: 92 mg/dL (ref 70–99)
Potassium: 3.5 mmol/L (ref 3.5–5.1)
Sodium: 142 mmol/L (ref 135–145)

## 2020-03-06 LAB — CBC
HCT: 34.1 % — ABNORMAL LOW (ref 36.0–46.0)
Hemoglobin: 10.6 g/dL — ABNORMAL LOW (ref 12.0–15.0)
MCH: 31.3 pg (ref 26.0–34.0)
MCHC: 31.1 g/dL (ref 30.0–36.0)
MCV: 100.6 fL — ABNORMAL HIGH (ref 80.0–100.0)
Platelets: 117 10*3/uL — ABNORMAL LOW (ref 150–400)
RBC: 3.39 MIL/uL — ABNORMAL LOW (ref 3.87–5.11)
RDW: 18.7 % — ABNORMAL HIGH (ref 11.5–15.5)
WBC: 8.2 10*3/uL (ref 4.0–10.5)
nRBC: 0 % (ref 0.0–0.2)

## 2020-03-06 LAB — MAGNESIUM: Magnesium: 1.8 mg/dL (ref 1.7–2.4)

## 2020-03-06 MED ORDER — POTASSIUM CHLORIDE CRYS ER 20 MEQ PO TBCR
40.0000 meq | EXTENDED_RELEASE_TABLET | Freq: Once | ORAL | Status: AC
  Administered 2020-03-06: 40 meq via ORAL
  Filled 2020-03-06: qty 2

## 2020-03-06 NOTE — Progress Notes (Signed)
PROGRESS NOTE    Linda Stevens  MWN:[id number] DOB: 03-02-1946 DOA: 02/19/2020 PCP: [name], MD   Brief Narrative: 74 year old female with history of COPD/chronic RF on 3 L, diastolic CHF, pulmonary HTN, HTN and HLD presenting with progressive shortness of breath for 1 week. Tried with a course of Z-Pak and a steroid taper by her pulmonologist on 6/9 but continued to have progressive shortness of breath.  In ED, slightly hypotensive. Required 6 L by [place]. WBC 11.4. AST 90. Total bili 1.9. Lactic acid 2.8. BNP 164. Trop negative x2. CXR concerning for cardiomegaly with small pleural effusion and by basilar atelectasis. CTA chest showed sizable pericardial effusion, bilateral pleural effusions and consolidation concerning for pneumonia and pulmonary HTN. Cultures drawn. Received IV Solu-Medrol, morphine, Rocephin and azithromycin,and admitted for acute on chronic respiratory failure due to pneumonia, pleural effusions and pericardial effusion. Cardiology consulted for pericardial effusion. Echo obtained and revealed dilated and fixed IVC although no other features of tamponade.  The next day, blood cultures with GPC's in clusters in 4/4. BICD with coag negative MR staph. Vancomycin added. ID consulted. Azithromycin and ceftriaxone discontinued. Repeat blood culture obtained and NGTD. Eventually, vancomycin discontinued as susceptibilities on initial blood culture were different suggesting contamination versus true bacteremia.  Patient went into A. fib with RVR with hypotension on 6/14. Started on amiodarone drip and converted to sinus rhythm. Advanced heart failure team and palliative medicine involved as well.   Assessment & Plan:   Principal Problem:   Positive blood cultures Active Problems:   Goals of care, counseling/discussion   Congestive heart failure (CHF) (HCC)   Pulmonary artery hypertension (HCC)   Acute on chronic diastolic congestive heart  failure (HCC)   Acute on chronic respiratory failure with hypoxia (HCC)   Pericardial effusion   Acute respiratory failure with hypoxia and hypercapnea   Hepatic cirrhosis (HCC)   Pleural effusion, bilateral   Community acquired pneumonia   New onset atrial fibrillation ([place])   Palliative care by specialist   DNR (do not resuscitate)  #1 large pericardial effusion without evidence of tamponade.  Repeat echo 02/21/2020 pericardial effusion smaller but still remain large.  Patient was stable hemodynamically.  Seen by cardiology.  No indication for pericardiocentesis or window at this time.  Rheumatology work-up has been negative.  Continue colchicine for total 3 months per cardiology. -They also recommended steroid taper over 3 weeks and repeat echo in 6 weeks.  #2 pulmonary hypertension: Stable at this time. On sildenafil and letaris  #3 chronic diastolic heart failure with preserved ejection fraction grade 1 diastolic dysfunction.  Continue Lasix 40 mg daily.  #4 paroxysmal atrial fibrillation initially treated with amiodarone now in sinus rhythm. Cardiology okay to start Eliquis. Started on 02/24/2020.  #5  Acute on chronic renal failure: Fluctuating creatinine.  Continue to monitor.  She likely has baseline CKD stage III.  #6 acute metabolic encephalopathy resolved. Work-up included ammonia elevated at 52. TSH and B12 were normal.  She was on morphine tramadol and Phenergan which could contribute which has been stopped. She was found to be hypercarbic at 79. Treated with BiPAP. -Currently on oxygen by nasal cannula.  Mental status stable at this time.  #7+ blood cultures deemed to be contamination.  ID was consulted. Did not recommend antibiotics.  Transthoracic echo without vegetation.  #8 community-acquired pneumonia status post IV antibiotics finished the course.  #9 hypotension patient has been having soft blood pressure on Lasix and amiodarone.  Continue and monitor.  #  10  goals of care patient's family not comfortable taking her home.  They would like to consider rehab on discharge.  PT reconsulted.  Awaiting SNF.  #11 thrombocytopenia: Noted to have a gradual decline in platelet count. Platelet count low but fluctuating. Etiology unclear at this time. This morning 117. Continue to monitor. If it falls below 100 may have to consider consulting hematology.   Estimated body mass index is 22.34 kg/m as calculated from the following:   Height as of this encounter: _0  (1.6 m).   Weight as of this encounter: 57.2 kg.  DVT prophylaxis:eliquis Code Status: full Family Communication: Discussed previously with patient's daughter at bedside. No family at bedside today. Disposition Plan:  Status is: Inpatient  Dispo: The patient is from:home              Anticipated d/c is to: Unknown              Anticipated d/c date unknown at this time.  Awaiting SNF.              Patient currently admitted with pericardial effusion, family not comfortable taking her home.  Awaiting SNF Consultants: palliative care   Procedures:egd 6/22 Antimicrobials: Anti-infectives (From admission, onward)   Start     Dose/Rate Route Frequency Ordered Stop   02/21/20 0600  vancomycin (VANCOCIN) IVPB 1000 mg/200 mL premix  Status:  Discontinued        1,000 mg 200 mL/hr over 60 Minutes Intravenous Every 24 hours 02/20/20 0621 02/23/20 1223   02/20/20 1000  cefTRIAXone (ROCEPHIN) 2 g in sodium chloride 0.9 % 100 mL IVPB  Status:  Discontinued        2 g 200 mL/hr over 30 Minutes Intravenous Every 24 hours 02/19/20 1342 02/20/20 0616   02/20/20 1000  azithromycin (ZITHROMAX) 500 mg in sodium chloride 0.9 % 250 mL IVPB  Status:  Discontinued        500 mg 250 mL/hr over 60 Minutes Intravenous Every 24 hours 02/19/20 1342 02/20/20 1019   02/20/20 1000  cefTRIAXone (ROCEPHIN) 2 g in sodium chloride 0.9 % 100 mL IVPB  Status:  Discontinued        2 g 200 mL/hr over 30 Minutes Intravenous  Every 24 hours 02/20/20 0913 02/21/20 0749   02/20/20 0645  vancomycin (VANCOREADY) IVPB 1250 mg/250 mL        1,250 mg 166.7 mL/hr over 90 Minutes Intravenous  Once 02/20/20 0621 02/21/20 1649   02/19/20 1015  cefTRIAXone (ROCEPHIN) 1 g in sodium chloride 0.9 % 100 mL IVPB        1 g 200 mL/hr over 30 Minutes Intravenous  Once 02/19/20 1008 02/19/20 1151   02/19/20 1015  azithromycin (ZITHROMAX) 500 mg in sodium chloride 0.9 % 250 mL IVPB        500 mg 250 mL/hr over 60 Minutes Intravenous  Once 02/19/20 1008 02/19/20 1324      Subjective: She denies having any major complaints at this time.  Objective: Vitals:   03/06/20 0745 03/06/20 1000 03/06/20 1115 03/06/20 1721  BP: (!) 100/49 (!) 99/44 (!) 103/54 (!) 109/47  Pulse: 64 72 70 74  Resp: _1 Temp: 98.2 F (36.8 C)  98.6 F (37 C) 98.4 F (36.9 C)  TempSrc: Oral  Oral Oral  SpO2: 99% 98% 100% 99%  Weight:      Height:        Intake/Output Summary (Last 24 hours) at 03/06/2020  Louisa filed at 03/06/2020 0300 Gross per 24 hour  Intake 342.01 ml  Output --  Net 342.01 ml   Filed Weights   03/04/20 0303 03/05/20 0428 03/06/20 0500  Weight: 57.7 kg 58 kg 57.2 kg    Examination:  General exam: Awake, appears calm and comfortable  Respiratory system: Decreased breath sound lower lobes, clear to auscultation. Respiratory effort normal. Cardiovascular system: S1 & S2 heard, RRR. No murmur. No pedal edema. Gastrointestinal system: Abdomen is nondistended, soft and nontender. No masses felt. Normal bowel sounds heard. Central nervous system: Alert and oriented. No focal neurological deficits. Extremities: Symmetric 5 x 5 power. Skin: No rashes, lesions or ulcers Psychiatry: Judgement and insight appear normal. Mood & affect appropriate.     Data Reviewed: I have personally reviewed following labs and imaging studies  CBC: Recent Labs  Lab 02/29/20 1431 03/03/20 0443 03/04/20 0219 03/05/20 0351  03/06/20 0501  WBC 13.4* 10.0 9.0 8.6 8.2  HGB 11.2* 10.6* 10.7* 10.3* 10.6*  HCT 36.1 33.9* 34.4* 33.5* 34.1*  MCV 100.6* 99.7 99.4 101.2* 100.6*  PLT 143* 130* 119* 120* 993*   Basic Metabolic Panel: Recent Labs  Lab 02/29/20 1431 03/03/20 0443 03/04/20 0219 03/05/20 0351 03/06/20 0501  NA 139 141 141 142 142  K 4.2 3.4* 3.8 3.6 3.5  CL 95* 98 100 99 100  CO2 36* 31 34* 34* 34*  GLUCOSE 152* 83 84 84 92  BUN _0 CREATININE 1.03* 1.09* 1.12* 1.13* 1.14*  CALCIUM 8.0* 7.9* 7.8* 8.0* 7.9*  MG  --   --   --   --  1.8   GFR: Estimated Creatinine Clearance: 37.4 mL/min (A) (by C-G formula based on SCr of 1.14 mg/dL (H)). Liver Function Tests: Recent Labs  Lab 02/29/20 1431  AST 17  ALT 22  ALKPHOS 78  BILITOT 1.0  PROT 5.3*  ALBUMIN 2.8*   No results for input(s): LIPASE, AMYLASE in the last 168 hours. No results for input(s): AMMONIA in the last 168 hours. Coagulation Profile: No results for input(s): INR, PROTIME in the last 168 hours. Cardiac Enzymes: No results for input(s): CKTOTAL, CKMB, CKMBINDEX, TROPONINI in the last 168 hours. BNP (last 3 results) No results for input(s): PROBNP in the last 8760 hours. HbA1C: No results for input(s): HGBA1C in the last 72 hours. CBG: No results for input(s): GLUCAP in the last 168 hours. Lipid Profile: No results for input(s): CHOL, HDL, LDLCALC, TRIG, CHOLHDL, LDLDIRECT in the last 72 hours. Thyroid Function Tests: No results for input(s): TSH, T4TOTAL, FREET4, T3FREE, THYROIDAB in the last 72 hours. Anemia Panel: No results for input(s): VITAMINB12, FOLATE, FERRITIN, TIBC, IRON, RETICCTPCT in the last 72 hours. Sepsis Labs: No results for input(s): PROCALCITON, LATICACIDVEN in the last 168 hours.  Recent Results (from the past 240 hour(s))  SARS Coronavirus 2 by RT PCR (hospital order, performed in [facility] hospital lab) Nasopharyngeal Nasopharyngeal Swab     Status: None   Collection Time: 03/05/20   4:25 PM   Specimen: Nasopharyngeal Swab  Result Value Ref Range Status   SARS Coronavirus 2 NEGATIVE NEGATIVE Final    Comment: (NOTE) SARS-CoV-2 target nucleic acids are NOT DETECTED.  The SARS-CoV-2 RNA is generally detectable in upper and lower respiratory specimens during the acute phase of infection. The lowest concentration of SARS-CoV-2 viral copies this assay can detect is 250 copies / mL. A negative result does not preclude SARS-CoV-2 infection and should not be  used as the sole basis for treatment or other patient management decisions.  A negative result may occur with improper specimen collection / handling, submission of specimen other than nasopharyngeal swab, presence of viral mutation(s) within the areas targeted by this assay, and inadequate number of viral copies (<250 copies / mL). A negative result must be combined with clinical observations, patient history, and epidemiological information.  Fact Sheet for Patients:   StrictlyIdeas.no  Fact Sheet for Healthcare Providers: BankingDealers.co.za  This test is not yet approved or  cleared by the [place] FDA and has been authorized for detection and/or diagnosis of SARS-CoV-2 by FDA under an Emergency Use Authorization (EUA).  This EUA will remain in effect (meaning this test can be used) for the duration of the COVID-19 declaration under Section 564(b)(1) of the Act, 21 U.S.C. section 360bbb-3(b)(1), unless the authorization is terminated or revoked sooner.  Performed at [facility], [place] [address], [place], [place] [address]       Radiology Studies: No results found.  Scheduled Meds: . acetaminophen  650 mg Oral Q8H  . acidophilus  1 capsule Oral Daily  . ambrisentan  10 mg Oral Daily  . amiodarone  200 mg Oral Daily  . apixaban  5 mg Oral BID  . aspirin EC  81 mg Oral Daily  . colchicine  0.3 mg Oral BID  . fluticasone  2 spray Each Nare  Daily  . umeclidinium bromide  2 puff Inhalation Daily   And  . fluticasone furoate-vilanterol  2 puff Inhalation Daily  . furosemide  40 mg Oral Daily  . guaiFENesin  600 mg Oral BID  . lidocaine  1 patch Transdermal Q24H  . nystatin  5 mL Oral QID  . pantoprazole  40 mg Oral Q1200  . predniSONE  30 mg Oral Q breakfast   Followed by  . [START ON 03/08/2020] predniSONE  20 mg Oral Q breakfast   Followed by  . [START ON 03/13/2020] predniSONE  10 mg Oral Q breakfast   Followed by  . [START ON 03/18/2020] predniSONE  5 mg Oral Q breakfast  . tadalafil (PAH)  40 mg Oral Daily   Continuous Infusions:   LOS: 16 days    Yaakov Guthrie, MD Pager on amion  03/06/2020, 5:34 PM

## 2020-03-07 LAB — CBC
HCT: 32.2 % — ABNORMAL LOW (ref 36.0–46.0)
Hemoglobin: 9.9 g/dL — ABNORMAL LOW (ref 12.0–15.0)
MCH: 30.5 pg (ref 26.0–34.0)
MCHC: 30.7 g/dL (ref 30.0–36.0)
MCV: 99.1 fL (ref 80.0–100.0)
Platelets: 116 10*3/uL — ABNORMAL LOW (ref 150–400)
RBC: 3.25 MIL/uL — ABNORMAL LOW (ref 3.87–5.11)
RDW: 18.6 % — ABNORMAL HIGH (ref 11.5–15.5)
WBC: 7.9 10*3/uL (ref 4.0–10.5)
nRBC: 0 % (ref 0.0–0.2)

## 2020-03-07 LAB — BASIC METABOLIC PANEL
Anion gap: 8 (ref 5–15)
BUN: 15 mg/dL (ref 8–23)
CO2: 33 mmol/L — ABNORMAL HIGH (ref 22–32)
Calcium: 8 mg/dL — ABNORMAL LOW (ref 8.9–10.3)
Chloride: 100 mmol/L (ref 98–111)
Creatinine, Ser: 1.12 mg/dL — ABNORMAL HIGH (ref 0.44–1.00)
GFR calc Af Amer: 57 mL/min — ABNORMAL LOW (ref >60)
GFR calc non Af Amer: 49 mL/min — ABNORMAL LOW (ref >60)
Glucose, Bld: 90 mg/dL (ref 70–99)
Potassium: 3.8 mmol/L (ref 3.5–5.1)
Sodium: 141 mmol/L (ref 135–145)

## 2020-03-07 NOTE — Progress Notes (Signed)
Physical Therapy Treatment Patient Details Name: Linda Stevens MRN: [id number] DOB: 15-Feb-1946 Today's Date: 03/07/2020    History of Present Illness Pt adm with acute hypoxic respiratory failure and large pericardial effusion and PNA. PMH - diastolic heart failure, pulmonary htn, copd, htn    PT Comments    Pt supine in bed crying on entry, pt very anxious about need for home meds to be brought in and verified with pharmacy. Pt frustrated with trying to coordinate with husband to find and bring in medications. Pt initially refuses to work with therapy. PT provided increased support and lead pt in deep breathing to decrease anxiety. Pt then reports need to get up to [facility]. Pt mod I to come to EoB and min A for standing and pivoting to BSC. With increased encouragement pt able to ambulate around bed to get in other side with light min A for comfort. Pt's husband walked in at end of session and pt with increase relief. D/c plans remain appropriate. PT will continue to follow acutely.     Follow Up Recommendations  SNF     Equipment Recommendations  Other (comment) (TBD at next venue)       Precautions / Restrictions Precautions Precautions: Other (comment) Precaution Comments: watch SpO2 Restrictions Weight Bearing Restrictions: No    Mobility  Bed Mobility Overal bed mobility: Modified Independent Bed Mobility: Supine to Sit     Supine to sit: HOB elevated;Modified independent (Device/Increase time)     General bed mobility comments: increased time and effort  Transfers Overall transfer level: Needs assistance Equipment used: 1 person hand held assist Transfers: Sit to/from [facility] Sit to Stand: Min assist Stand pivot transfers: Min assist       General transfer comment: min A to rise and steady while holding onto PT hand and then for pivot to [facility]  Ambulation/Gait Ambulation/Gait assistance: Min assist Gait Distance (Feet): 12 Feet Assistive  device: Rolling walker (2 wheeled) Gait Pattern/deviations: Step-to pattern;Decreased step length - right;Decreased step length - left Gait velocity: decr   General Gait Details: light min A for reassurance with ambulation from [facility] around to other side of bed, pt requests not to sit up in recliner as her husband will be arriving soon         Balance Overall balance assessment: Needs assistance Sitting-balance support: No upper extremity supported;Feet supported Sitting balance-Leahy Scale: Good Sitting balance - Comments: maintained SPO2 in low 90s sitting EOB on 4L via [place] and v/c's for deep breathing   Standing balance support: During functional activity;Single extremity supported Standing balance-Leahy Scale: Fair Standing balance comment: requires external support                            Cognition Arousal/Alertness: Awake/alert Behavior During Therapy: Anxious Overall Cognitive Status: Within Functional Limits for tasks assessed                                 General Comments: pt with increased anxiety over husband not being there and RN asking questions about home meds      Exercises Other Exercises Other Exercises: "sink push ups" standing at sink to improve endurance and strength for BADLs (educated to add into typical grooming routine)    General Comments General comments (skin integrity, edema, etc.): SaO2 stable on 4L O2 via       Pertinent Vitals/Pain  Pain Assessment: Faces Faces Pain Scale: Hurts a little bit Pain Location: Generalized (abdomen) Pain Descriptors / Indicators: Grimacing Pain Intervention(s): Limited activity within patient's tolerance;Monitored during session;Repositioned           PT Goals (current goals can now be found in the care plan section) Acute Rehab PT Goals Patient Stated Goal: return home PT Goal Formulation: With patient Time For Goal Achievement: 03/07/20 Potential to Achieve Goals:  Good Progress towards PT goals: Not progressing toward goals - comment (limited by anxiety today)    Frequency    Min 2X/week      PT Plan Current plan remains appropriate       AM-PAC PT "6 Clicks" Mobility   Outcome Measure  Help needed turning from your back to your side while in a flat bed without using bedrails?: None Help needed moving from lying on your back to sitting on the side of a flat bed without using bedrails?: None Help needed moving to and from a bed to a chair (including a wheelchair)?: A Little Help needed standing up from a chair using your arms (e.g., wheelchair or bedside chair)?: A Little Help needed to walk in hospital room?: A Little Help needed climbing 3-5 steps with a railing? : A Lot 6 Click Score: 19    End of Session Equipment Utilized During Treatment: Gait belt;Oxygen Activity Tolerance: Patient tolerated treatment well Patient left: with call bell/phone within reach;in bed;with family/visitor present Nurse Communication: Mobility status PT Visit Diagnosis: Other abnormalities of gait and mobility (R26.89);Muscle weakness (generalized) (M62.81)     Time: 5521-7471 PT Time Calculation (min) (ACUTE ONLY): 38 min  Charges:  $Therapeutic Exercise: 8-22 mins $Therapeutic Activity: 8-22 mins                     Tanairi Cypert B. Migdalia Dk PT, DPT Acute Rehabilitation Services Pager [phone] Office [phone]    [place] 03/07/2020, 5:14 PM

## 2020-03-07 NOTE — TOC Progression Note (Signed)
Transition of Care [facility]) - Progression Note    Patient Details  Name: Linda Stevens MRN: [id number] Date of Birth: 1945/11/03  Transition of Care [facility]) CM/SW [place], [place] Phone Number: 03/07/2020, 2:58 PM  Clinical Narrative:     CSW is waiting for [place] to receive insurance [place].    Expected Discharge Plan: Skilled Nursing Facility Barriers to Discharge: Ship broker, Continued Medical Work up  Expected Discharge Plan and Services Expected Discharge Plan: [place]   Discharge Planning Services: CM Consult Post Acute Care Choice: Durable Medical Equipment, Home Health Living arrangements for the past 2 months: Single Family Home                           HH Arranged: PT, OT HH Agency: [facility] Date [place]: 02/29/20 Time [place]: 1030 Representative spoke with at [place]: Linda (SDOH) Interventions    Readmission Risk Interventions No flowsheet data found.  Emeterio Reeve, Latanya Presser, [place] Social Worker [phone]

## 2020-03-07 NOTE — Progress Notes (Signed)
PROGRESS NOTE    Linda Stevens  IPJ:[id number] DOB: [birth year] DOA: 02/19/2020 PCP: [name], MD   Chief Complaint  Patient presents with  . Shortness of Breath  . Tachycardia    Brief Narrative:  74 year old female with history of COPD/chronic RF on 3 L, diastolic CHF, pulmonary HTN, HTN and HLD presenting with progressive shortness of breath for 1 week. Tried with a course of Z-Pak and a steroid taper by her pulmonologist on 6/9 but continued to have progressive shortness of breath.  In ED, slightly hypotensive. Required 6 L by [place]. WBC 11.4. AST 90. Total bili 1.9. Lactic acid 2.8. BNP 164. Trop negative x2. CXR concerning for cardiomegaly with small pleural effusion and by basilar atelectasis. CTA chest showed sizable pericardial effusion, bilateral pleural effusions and consolidation concerning for pneumonia and pulmonary HTN. Cultures drawn. Received IV Solu-Medrol, morphine, Rocephin and azithromycin,and admitted for acute on chronic respiratory failure due to pneumonia, pleural effusions and pericardial effusion. Cardiology consulted for pericardial effusion. Echo obtained and revealed dilated and fixed IVC although no other features of tamponade.  The next day, blood cultures with GPC's in clusters in 4/4. BICD with coag negative MR staph. Vancomycin added. ID consulted. Azithromycin and ceftriaxone discontinued. Repeat blood culture obtained and NGTD. Eventually, vancomycin discontinued as susceptibilities on initial blood culture were different suggesting contamination versus true bacteremia.  Patient went into A. fib with RVR with hypotension on 6/14. Started on amiodarone drip and converted to sinus rhythm. Advanced heart failure team and palliative medicine involved as well.  Assessment & Plan:   Principal Problem:   Positive blood cultures Active Problems:   Goals of care, counseling/discussion   Congestive heart failure (CHF) (HCC)    Pulmonary artery hypertension (HCC)   Acute on chronic diastolic congestive heart failure (HCC)   Acute on chronic respiratory failure with hypoxia (HCC)   Pericardial effusion   Acute respiratory failure with hypoxia and hypercapnea   Hepatic cirrhosis (HCC)   Pleural effusion, bilateral   Community acquired pneumonia   New onset atrial fibrillation ([place])   Palliative care by specialist   DNR (do not resuscitate)   Large pericardial effusion without evidence of tamponade Repeat echocardiogram showed improvement.  Cardiology consulted and recommended no indication for pericardiocentesis or pericardial window at this time. Rheumatology work-up has been negative so far. Recommend to continue colchicine for a total of 3 months.  And plan to steroid taper over 3 weeks.  Repeat echocardiogram in 6 weeks.   Pulmonary hypertension Stable Patient on sildenafil and letaris   Chronic diastolic heart failure Echo showed preserved left ventricular function and grade 1 diastolic dysfunction. Patient is currently euvolemic Continue with Lasix 40 mg daily.   Paroxysmal atrial fibrillation initially treated with amiodarone and currently in sinus rhythm. Currently on Eliquis for anticoagulation.    Acute on chronic kidney disease stage IIIa Continue to monitor. .  Acute metabolic encephalopathy Resolved.      Community-acquired pneumonia Completed the course of IV antibiotics.    Thrombocytopenia Unclear etiology probably secondary to infection versus medication.  Continue to monitor and recommend outpatient follow-up with hematology if platelets continues to decline.   Body mass index is 22.25 kg/m.    In view of multiple medical problems palliative care consulted for goals of care discussion.  Family at this time would like to consider rehab on discharge.  PT evaluation recommended SNF.  Patient is currently medically stable for discharge awaiting for SNF  bed.  DVT prophylaxis: Eliquis Code Status: DNR Family Communication: (None at bedside disposition:   Status is: Inpatient  Remains inpatient appropriate because:Unsafe d/c plan   Dispo: The patient is from: Home              Anticipated d/c is to: SNF              Anticipated d/c date is: 1 day              Patient currently is medically stable to d/c.       Consultants:   Cardiology  Heart failure  Procedures: None Antimicrobials: None  Subjective: No new complaints  Objective: Vitals:   03/07/20 0339 03/07/20 0805 03/07/20 1125 03/07/20 1651  BP: (!) 108/49 (!) 105/47 (!) 106/50 (!) 109/50  Pulse: 67 69 71 70  Resp: _0 Temp: 98.3 F (36.8 C) 97.9 F (36.6 C) 98.5 F (36.9 C) 98 F (36.7 C)  TempSrc: Oral Oral Oral Oral  SpO2: 99% 100% 98% 98%  Weight: 57 kg     Height:       No intake or output data in the 24 hours ending 03/07/20 1756 Filed Weights   03/05/20 0428 03/06/20 0500 03/07/20 0339  Weight: 58 kg 57.2 kg 57 kg    Examination:  General exam: Appears calm and comfortable  Respiratory system :diminished air entry at bases, no wheezing or rhonchi,  Cardiovascular system: S1 & S2 heard, RRR. No JVD,  No pedal edema. Gastrointestinal system: Abdomen is nondistended, soft and nontender. Normal bowel sounds heard. Central nervous system: Alert and oriented. No focal neurological deficits. Extremities: Symmetric 5 x 5 power. Skin: No rashes, lesions or ulcers Psychiatry:  Mood & affect appropriate.     Data Reviewed: I have personally reviewed following labs and imaging studies  CBC: Recent Labs  Lab 03/03/20 0443 03/04/20 0219 03/05/20 0351 03/06/20 0501 03/07/20 0337  WBC 10.0 9.0 8.6 8.2 7.9  HGB 10.6* 10.7* 10.3* 10.6* 9.9*  HCT 33.9* 34.4* 33.5* 34.1* 32.2*  MCV 99.7 99.4 101.2* 100.6* 99.1  PLT 130* 119* 120* 117* 116*    Basic Metabolic Panel: Recent Labs  Lab 03/03/20 0443 03/04/20 0219  03/05/20 0351 03/06/20 0501 03/07/20 0337  NA 141 141 142 142 141  K 3.4* 3.8 3.6 3.5 3.8  CL 98 100 99 100 100  CO2 31 34* 34* 34* 33*  GLUCOSE 83 84 84 92 90  BUN _1 CREATININE 1.09* 1.12* 1.13* 1.14* 1.12*  CALCIUM 7.9* 7.8* 8.0* 7.9* 8.0*  MG  --   --   --  1.8  --     GFR: Estimated Creatinine Clearance: 38.1 mL/min (A) (by C-G formula based on SCr of 1.12 mg/dL (H)).  Liver Function Tests: No results for input(s): AST, ALT, ALKPHOS, BILITOT, PROT, ALBUMIN in the last 168 hours.  CBG: No results for input(s): GLUCAP in the last 168 hours.   Recent Results (from the past 240 hour(s))  SARS Coronavirus 2 by RT PCR (hospital order, performed in [facility] hospital lab) Nasopharyngeal Nasopharyngeal Swab     Status: None   Collection Time: 03/05/20  4:25 PM   Specimen: Nasopharyngeal Swab  Result Value Ref Range Status   SARS Coronavirus 2 NEGATIVE NEGATIVE Final    Comment: (NOTE) SARS-CoV-2 target nucleic acids are NOT DETECTED.  The SARS-CoV-2 RNA is generally detectable in upper and lower respiratory specimens during the acute phase of infection. The  lowest concentration of SARS-CoV-2 viral copies this assay can detect is 250 copies / mL. A negative result does not preclude SARS-CoV-2 infection and should not be used as the sole basis for treatment or other patient management decisions.  A negative result may occur with improper specimen collection / handling, submission of specimen other than nasopharyngeal swab, presence of viral mutation(s) within the areas targeted by this assay, and inadequate number of viral copies (<250 copies / mL). A negative result must be combined with clinical observations, patient history, and epidemiological information.  Fact Sheet for Patients:   StrictlyIdeas.no  Fact Sheet for Healthcare Providers: BankingDealers.co.za  This test is not yet approved or  cleared by  the [place] FDA and has been authorized for detection and/or diagnosis of SARS-CoV-2 by FDA under an Emergency Use Authorization (EUA).  This EUA will remain in effect (meaning this test can be used) for the duration of the COVID-19 declaration under Section 564(b)(1) of the Act, 21 U.S.C. section 360bbb-3(b)(1), unless the authorization is terminated or revoked sooner.  Performed at [facility], [place] [address] [place], [place] [address]          Radiology Studies: No results found.      Scheduled Meds: . acetaminophen  650 mg Oral Q8H  . acidophilus  1 capsule Oral Daily  . ambrisentan  10 mg Oral Daily  . amiodarone  200 mg Oral Daily  . apixaban  5 mg Oral BID  . aspirin EC  81 mg Oral Daily  . colchicine  0.3 mg Oral BID  . fluticasone  2 spray Each Nare Daily  . umeclidinium bromide  2 puff Inhalation Daily   And  . fluticasone furoate-vilanterol  2 puff Inhalation Daily  . furosemide  40 mg Oral Daily  . guaiFENesin  600 mg Oral BID  . lidocaine  1 patch Transdermal Q24H  . nystatin  5 mL Oral QID  . pantoprazole  40 mg Oral Q1200  . [START ON 03/08/2020] predniSONE  20 mg Oral Q breakfast   Followed by  . [START ON 03/13/2020] predniSONE  10 mg Oral Q breakfast   Followed by  . [START ON 03/18/2020] predniSONE  5 mg Oral Q breakfast  . tadalafil (PAH)  40 mg Oral Daily   Continuous Infusions:   LOS: 17 days        Hosie Poisson, MD [facility]   To contact the attending provider between 7A-7P or the covering provider during after hours 7P-7A, please log into the web site [email or web address] and access using universal [place] password for that web site. If you do not have the password, please call the hospital operator.  03/07/2020, 5:56 PM

## 2020-03-07 NOTE — Progress Notes (Signed)
Occupational Therapy Treatment Patient Details Name: Linda Stevens MRN: [id number] DOB: 09/25/1945 Today's Date: 03/07/2020    History of present illness Pt adm with acute hypoxic respiratory failure and large pericardial effusion and PNA. PMH - diastolic heart failure, pulmonary htn, copd, htn   OT comments  Pt progressing well toward stated goals. Session focused on BADL routine engagement with exercise implementation to promote increased strength and endurance. Pt compelted bed mobility at mod I level. She then mobilized to the sink with 1 HHA from OT. Once at sink pt engaged in x2 grooming tasks while standing. She was able to self initiate pursed lip breathing without therapist cueing, as well as tolerate standing throughout entire tasks. Educated pt on "sink push ups" to add to grooming routine to increase strength and endurance. Pt able to complete x5 before fatigued. VSS on 4L [place] this session. Recommend SNF for increased BADL training and independence prior to d/c home, but suspect pt could progress to [facility] with supervision from family soon. Will continue to follow.    Follow Up Recommendations  SNF    Equipment Recommendations  Tub/shower seat    Recommendations for Other Services      Precautions / Restrictions Precautions Precautions: Other (comment) Precaution Comments: watch SpO2 Restrictions Weight Bearing Restrictions: No       Mobility Bed Mobility Overal bed mobility: Modified Independent                Transfers Overall transfer level: Needs assistance Equipment used: 1 person hand held assist   Sit to Stand: Min assist         General transfer comment: min A to rise and steady while handing onto OTs hand for support    Balance Overall balance assessment: Needs assistance Sitting-balance support: No upper extremity supported;Feet supported Sitting balance-Leahy Scale: Good     Standing balance support: During functional activity;Single  extremity supported Standing balance-Leahy Scale: Fair Standing balance comment: requires external support                           ADL either performed or assessed with clinical judgement   ADL Overall ADL's : Needs assistance/impaired     Grooming: Min guard;Standing;Oral care;Wash/dry face Grooming Details (indicate cue type and reason): pt stood at sink to complete x2 grooming tasks. Educated pt on ECS strategies throughout tasks to maintain energy. Pt was able to stand for 100% of tasks                             Functional mobility during ADLs: Min guard;Rolling walker General ADL Comments: educated pt on therapeutic exercise to complete at sink to add to BADL routine to promote increased strength and endurance     Vision Patient Visual Report: No change from baseline     Perception     Praxis      Cognition Arousal/Alertness: Awake/alert Behavior During Therapy: Anxious Overall Cognitive Status: Within Functional Limits for tasks assessed                                 General Comments: remains mildly anxious throughout session, but gaining more confidence with mobility and BADL tasks. Continues to benefit from cues and reinforcement        Exercises Other Exercises Other Exercises: "sink push ups" standing at sink to improve  endurance and strength for BADLs (educated to add into typical grooming routine)   Shoulder Instructions       General Comments pt on 4L [place] with SpO2 stable    Pertinent Vitals/ Pain       Pain Assessment: No/denies pain  Home Living                                          Prior Functioning/Environment              Frequency  Min 2X/week        Progress Toward Goals  OT Goals(current goals can now be found in the care plan section)  Progress towards OT goals: Progressing toward goals  Acute Rehab OT Goals Patient Stated Goal: return home OT Goal Formulation:  With patient Time For Goal Achievement: 03/21/20 Potential to Achieve Goals: Good  Plan Discharge plan remains appropriate    Co-evaluation                 AM-PAC OT "6 Clicks" Daily Activity     Outcome Measure   Help from another person eating meals?: None Help from another person taking care of personal grooming?: A Little Help from another person toileting, which includes using toliet, bedpan, or urinal?: A Little Help from another person bathing (including washing, rinsing, drying)?: A Little Help from another person to put on and taking off regular upper body clothing?: A Little Help from another person to put on and taking off regular lower body clothing?: A Little 6 Click Score: 19    End of Session Equipment Utilized During Treatment: Gait belt;Oxygen;Rolling walker  OT Visit Diagnosis: Muscle weakness (generalized) (M62.81);Unsteadiness on feet (R26.81)   Activity Tolerance Patient tolerated treatment well   Patient Left in bed;with call bell/phone within reach   Nurse Communication Mobility status        Time: 2890-2284 OT Time Calculation (min): 12 min  Charges: OT General Charges $OT Visit: 1 Visit OT Treatments $Self Care/Home Management : 8-22 mins  Zenovia Jarred, MSOT, OTR/L [facility] Office Number: [phone] Pager: [phone]  Zenovia Jarred 03/07/2020, 1:58 PM

## 2020-03-08 NOTE — Progress Notes (Signed)
PROGRESS NOTE    Linda Stevens  MEQ:[id number] DOB: 1946-01-04 DOA: 02/19/2020 PCP: [name], MD   Chief Complaint  Patient presents with  . Shortness of Breath  . Tachycardia    Brief Narrative:    74 year old lady with prior history of COPD, chronic respiratory failure on 3 L of nasal cannula oxygen, chronic diastolic heart failure, pulmonary hypertension, hyper lipidemia, essential hypertension presents to ED for shortness of breath.  Found to have large pericardial effusion without evidence of tamponade.  Cardiology consulted and recommended no indication for pericardiocentesis at this time. Hospital course was complicated by atrial fibrillation with hypotension 02/20/2020 he was started on amiodarone and she converted to sinus rhythm.  Assessment & Plan:   Principal Problem:   Positive blood cultures Active Problems:   Goals of care, counseling/discussion   Congestive heart failure (CHF) (HCC)   Pulmonary artery hypertension (HCC)   Acute on chronic diastolic congestive heart failure (HCC)   Acute on chronic respiratory failure with hypoxia (HCC)   Pericardial effusion   Acute respiratory failure with hypoxia and hypercapnea   Hepatic cirrhosis (HCC)   Pleural effusion, bilateral   Community acquired pneumonia   New onset atrial fibrillation ([place])   Palliative care by specialist   DNR (do not resuscitate)   Large pericardial effusion without evidence of tamponade Repeat echocardiogram showed improvement.  Cardiology consulted and recommended no indication for pericardiocentesis or pericardial window at this time. Rheumatology work-up has been negative so far. Recommend to continue colchicine for a total of 3 months.  And plan to steroid taper over 3 weeks.  Repeat echocardiogram in 6 weeks. Patient currently denies any shortness of breath at this time.  She is on 3 L of nasal cannula oxygen with good oxygen saturations.   Pulmonary  hypertension Stable Resume home medications.   Chronic diastolic heart failure Echo showed preserved left ventricular function and grade 1 diastolic dysfunction. Patient is currently euvolemic Continue with Lasix 40 mg daily.   Paroxysmal atrial fibrillation initially treated with amiodarone and currently in sinus rhythm. Currently on Eliquis for anticoagulation.  Anemia of chronic disease Hemoglobin stable between 11-10.  Acquired thrombophilia Patient is currently on Eliquis continue the same for stroke prophylaxis.   Hypokalemia Replaced.  Acute on chronic kidney disease stage IIIa Creatinine stable at 1.1. .  Acute metabolic encephalopathy Resolved.      Community-acquired pneumonia Completed the course of IV antibiotics.    Thrombocytopenia Unclear etiology probably secondary to infection versus medication.  Continue to monitor and recommend outpatient follow-up with hematology if platelets continues to decline.   Body mass index is 22.14 kg/m.    In view of multiple medical problems palliative care consulted for goals of care discussion.  Family at this time would like to consider rehab on discharge.  PT evaluation recommended SNF.  Patient is currently medically stable for discharge awaiting for SNF bed.         DVT prophylaxis: Eliquis Code Status: DNR Family Communication: Family at bedside Disposition:   Status is: Inpatient  Remains inpatient appropriate because:Unsafe d/c plan   Dispo: The patient is from: Home              Anticipated d/c is to: SNF              Anticipated d/c date is: 1 day              Patient currently is medically stable to d/c.  Consultants:   Cardiology  Heart failure  Procedures: None Antimicrobials: None  Subjective: Patient denies any chest pain, shortness of breath, nausea vomiting or abdominal pain.  Objective: Vitals:   03/08/20 0832 03/08/20 1100 03/08/20 1208 03/08/20 1631   BP:   (!) 117/51 (!) 104/52  Pulse:  78 80 76  Resp:   16 16  Temp:   98.1 F (36.7 C) 98.4 F (36.9 C)  TempSrc:   Oral Oral  SpO2: 100% 99% 96% 98%  Weight:      Height:        Intake/Output Summary (Last 24 hours) at 03/08/2020 1646 Last data filed at 03/08/2020 0500 Gross per 24 hour  Intake 360 ml  Output --  Net 360 ml   Filed Weights   03/06/20 0500 03/07/20 0339 03/08/20 0502  Weight: 57.2 kg 57 kg 56.7 kg    Examination:  General exam: Alert and comfortable, not in any kind of distress. Respiratory system diminished air entry at bases, no wheezing or rhonchi. Cardiovascular system: S1 S2 heard, regular rate rhythm, no JVD, no pedal edema. Gastrointestinal system: Abdomen is soft, nontender, nondistended, bowel sounds normal Central nervous system: alert and oriented, grossly non focal.  Extremities: No cyanosis or clubbing.  Skin: No rashes seen.  Psychiatry:  Mood is appropriate.     Data Reviewed: I have personally reviewed following labs and imaging studies  CBC: Recent Labs  Lab 03/03/20 0443 03/04/20 0219 03/05/20 0351 03/06/20 0501 03/07/20 0337  WBC 10.0 9.0 8.6 8.2 7.9  HGB 10.6* 10.7* 10.3* 10.6* 9.9*  HCT 33.9* 34.4* 33.5* 34.1* 32.2*  MCV 99.7 99.4 101.2* 100.6* 99.1  PLT 130* 119* 120* 117* 116*    Basic Metabolic Panel: Recent Labs  Lab 03/03/20 0443 03/04/20 0219 03/05/20 0351 03/06/20 0501 03/07/20 0337  NA 141 141 142 142 141  K 3.4* 3.8 3.6 3.5 3.8  CL 98 100 99 100 100  CO2 31 34* 34* 34* 33*  GLUCOSE 83 84 84 92 90  BUN _0 CREATININE 1.09* 1.12* 1.13* 1.14* 1.12*  CALCIUM 7.9* 7.8* 8.0* 7.9* 8.0*  MG  --   --   --  1.8  --     GFR: Estimated Creatinine Clearance: 38.1 mL/min (A) (by C-G formula based on SCr of 1.12 mg/dL (H)).  Liver Function Tests: No results for input(s): AST, ALT, ALKPHOS, BILITOT, PROT, ALBUMIN in the last 168 hours.  CBG: No results for input(s): GLUCAP in the last 168  hours.   Recent Results (from the past 240 hour(s))  SARS Coronavirus 2 by RT PCR (hospital order, performed in [facility] hospital lab) Nasopharyngeal Nasopharyngeal Swab     Status: None   Collection Time: 03/05/20  4:25 PM   Specimen: Nasopharyngeal Swab  Result Value Ref Range Status   SARS Coronavirus 2 NEGATIVE NEGATIVE Final    Comment: (NOTE) SARS-CoV-2 target nucleic acids are NOT DETECTED.  The SARS-CoV-2 RNA is generally detectable in upper and lower respiratory specimens during the acute phase of infection. The lowest concentration of SARS-CoV-2 viral copies this assay can detect is 250 copies / mL. A negative result does not preclude SARS-CoV-2 infection and should not be used as the sole basis for treatment or other patient management decisions.  A negative result may occur with improper specimen collection / handling, submission of specimen other than nasopharyngeal swab, presence of viral mutation(s) within the areas targeted by this assay, and inadequate number of viral  copies (<250 copies / mL). A negative result must be combined with clinical observations, patient history, and epidemiological information.  Fact Sheet for Patients:   StrictlyIdeas.no  Fact Sheet for Healthcare Providers: BankingDealers.co.za  This test is not yet approved or  cleared by the [place] FDA and has been authorized for detection and/or diagnosis of SARS-CoV-2 by FDA under an Emergency Use Authorization (EUA).  This EUA will remain in effect (meaning this test can be used) for the duration of the COVID-19 declaration under Section 564(b)(1) of the Act, 21 U.S.C. section 360bbb-3(b)(1), unless the authorization is terminated or revoked sooner.  Performed at [facility], [place] [address] [place], [place] [address]          Radiology Studies: No results found.      Scheduled Meds: . acetaminophen  650 mg Oral Q8H   . acidophilus  1 capsule Oral Daily  . ambrisentan  10 mg Oral Daily  . amiodarone  200 mg Oral Daily  . apixaban  5 mg Oral BID  . aspirin EC  81 mg Oral Daily  . colchicine  0.3 mg Oral BID  . fluticasone  2 spray Each Nare Daily  . umeclidinium bromide  2 puff Inhalation Daily   And  . fluticasone furoate-vilanterol  2 puff Inhalation Daily  . furosemide  40 mg Oral Daily  . guaiFENesin  600 mg Oral BID  . lidocaine  1 patch Transdermal Q24H  . nystatin  5 mL Oral QID  . pantoprazole  40 mg Oral Q1200  . predniSONE  20 mg Oral Q breakfast   Followed by  . [START ON 03/13/2020] predniSONE  10 mg Oral Q breakfast   Followed by  . [START ON 03/18/2020] predniSONE  5 mg Oral Q breakfast  . tadalafil (PAH)  40 mg Oral Daily   Continuous Infusions:   LOS: 18 days        Hosie Poisson, MD [facility]   To contact the attending provider between 7A-7P or the covering provider during after hours 7P-7A, please log into the web site [email or web address] and access using universal [place] password for that web site. If you do not have the password, please call the hospital operator.  03/08/2020, 4:46 PM

## 2020-03-08 NOTE — Plan of Care (Signed)
  Problem: Clinical Measurements: Goal: Ability to maintain clinical measurements within normal limits will improve Outcome: Progressing

## 2020-03-09 MED ORDER — AMBRISENTAN 5 MG PO TABS
10.0000 mg | ORAL_TABLET | Freq: Every day | ORAL | Status: DC
  Administered 2020-03-09 – 2020-03-10 (×2): 10 mg via ORAL
  Filled 2020-03-09: qty 1
  Filled 2020-03-09: qty 2

## 2020-03-09 NOTE — TOC Progression Note (Addendum)
Transition of Care [facility]) - Progression Note    Patient Details  Name: Linda Stevens MRN: [id number] Date of Birth: 10-Jan-1946  Transition of Care [facility]) CM/SW [place], [place] Phone Number: 03/09/2020, 8:26 AM  Clinical Narrative:     CSW spoke with patient's daughter [name] and provided an update that insurance authorization is pending. CSW will reach out to family and patient once an update is provided from SNF on insurance authorization. CSW entered avoidable days.  Expected Discharge Plan: Skilled Nursing Facility Barriers to Discharge: Ship broker, Continued Medical Work up  Expected Discharge Plan and Services Expected Discharge Plan: [place]   Discharge Planning Services: CM Consult Post Acute Care Choice: Durable Medical Equipment, Home Health Living arrangements for the past 2 months: Single Family Home                           HH Arranged: PT, OT HH Agency: [facility] Date [place]: 02/29/20 Time [place]: 1030 Representative spoke with at [place]: [place] (SDOH) Interventions    Readmission Risk Interventions No flowsheet data found.

## 2020-03-09 NOTE — Care Management Important Message (Signed)
Important Message  Patient Details  Name: Linda Stevens MRN: [id number] Date of Birth: [birth year]   Medicare Important Message Given:  Yes     Shelda Altes 03/09/2020, 10:46 AM

## 2020-03-09 NOTE — Progress Notes (Signed)
PROGRESS NOTE    Linda Stevens  INO:[id number] DOB: 1946-06-13 DOA: 02/19/2020 PCP: [name], MD   Chief Complaint  Patient presents with  . Shortness of Breath  . Tachycardia    Brief Narrative:    74 year old lady with prior history of COPD, chronic respiratory failure on 3 L of nasal cannula oxygen, chronic diastolic heart failure, pulmonary hypertension, hyper lipidemia, essential hypertension presents to ED for shortness of breath.  Found to have large pericardial effusion without evidence of tamponade.  Cardiology consulted and recommended no indication for pericardiocentesis at this time. Hospital course was complicated by atrial fibrillation with hypotension 02/20/2020 he was started on amiodarone and she converted to sinus rhythm. No new complaints today. Pt and her husband at bedside. Discussed the plan for discharge to snf and we are wiating for insurance authorization.   Assessment & Plan:   Principal Problem:   Positive blood cultures Active Problems:   Goals of care, counseling/discussion   Congestive heart failure (CHF) (HCC)   Pulmonary artery hypertension (HCC)   Acute on chronic diastolic congestive heart failure (HCC)   Acute on chronic respiratory failure with hypoxia (HCC)   Pericardial effusion   Acute respiratory failure with hypoxia and hypercapnea   Hepatic cirrhosis (HCC)   Pleural effusion, bilateral   Community acquired pneumonia   New onset atrial fibrillation ([place])   Palliative care by specialist   DNR (do not resuscitate)   Large pericardial effusion without evidence of tamponade Repeat echocardiogram showed improvement.  Cardiology consulted and recommended no indication for pericardiocentesis or pericardial window at this time. Rheumatology work-up has been negative so far. Recommend to continue colchicine for a total of 3 months.  And plan to steroid taper over 3 weeks.  Repeat echocardiogram in 6 weeks. Patient denies shortness  of breath at this time,  she is on 3 L of nasal cannula oxygen with good oxygen saturations.   Pulmonary hypertension Stable Resume tadalafil and ambrisentan.   Chronic diastolic heart failure Echo showed preserved left ventricular function and grade 1 diastolic dysfunction. Patient is currently euvolemic Continue with Lasix 40 mg daily. Continue to monitor strict intake and output and daily weights.   Paroxysmal atrial fibrillation initially treated with amiodarone and currently in sinus rhythm. Currently on Eliquis for anticoagulation. Echocardiogram shows preserved left ventricular ejection fraction and shows grade 1 diastolic dysfunction.  CHA2DS2-VASc score is about 3    Anemia of chronic disease Hemoglobin stable between 11-10.  Acquired thrombophilia Patient is currently on Eliquis continue the same for stroke prophylaxis.   Hypokalemia Replaced.  Acute on chronic kidney disease stage IIIa Creatinine stable at 1.1.  No new labs ordered today. .  Acute metabolic encephalopathy Resolved.      Community-acquired pneumonia Completed the course of IV antibiotics.    Thrombocytopenia Unclear etiology probably secondary to infection versus medication.  Continue to monitor and recommend outpatient follow-up with hematology if platelets continues to decline.   Body mass index is 21.84 kg/m.    In view of multiple medical problems palliative care consulted for goals of care discussion.  Family at this time would like to consider rehab on discharge.  PT evaluation recommended SNF.  Patient is currently medically stable for discharge awaiting for SNF bed.         DVT prophylaxis: Eliquis Code Status: DNR Family Communication: Family at bedside Disposition:   Status is: Inpatient  Remains inpatient appropriate because:Unsafe d/c plan   Dispo: The patient is from:  Home              Anticipated d/c is to: SNF              Anticipated d/c date is: 1  day              Patient currently is medically stable to d/c.       Consultants:   Cardiology  Heart failure  Procedures: None Antimicrobials: None  Subjective: Denies any chest pain, shortness of breath or nausea or vomiting or abdominal pain.  Objective: Vitals:   03/09/20 0358 03/09/20 0740 03/09/20 0843 03/09/20 1226  BP: (!) 106/42 (!) 99/46  (!) 101/46  Pulse: 80 73  84  Resp: _0 Temp: 97.8 F (36.6 C) 98 F (36.7 C)  98.5 F (36.9 C)  TempSrc: Oral Oral  Oral  SpO2: 94% 98% 96% 98%  Weight:      Height:        Intake/Output Summary (Last 24 hours) at 03/09/2020 1453 Last data filed at 03/09/2020 0800 Gross per 24 hour  Intake 720 ml  Output --  Net 720 ml   Filed Weights   03/07/20 0339 03/08/20 0502 03/09/20 0354  Weight: 57 kg 56.7 kg 55.9 kg    Examination:  General exam: Alert, comfortable on 3 L of nasal cannula oxygen. Respiratory system diminished air entry at bases, no wheezing or rhonchi Cardiovascular system: S1-S2 heard, regular rate rhythm, no JVD, no pedal edema Gastrointestinal system: Abdomen is soft, nontender, nondistended, bowel sounds normal Central nervous system: Alert and oriented, grossly nonfocal Extremities: No cyanosis or clubbing Skin: No rashes seen Psychiatry: Mood is appropriate    Data Reviewed: I have personally reviewed following labs and imaging studies  CBC: Recent Labs  Lab 03/03/20 0443 03/04/20 0219 03/05/20 0351 03/06/20 0501 03/07/20 0337  WBC 10.0 9.0 8.6 8.2 7.9  HGB 10.6* 10.7* 10.3* 10.6* 9.9*  HCT 33.9* 34.4* 33.5* 34.1* 32.2*  MCV 99.7 99.4 101.2* 100.6* 99.1  PLT 130* 119* 120* 117* 116*    Basic Metabolic Panel: Recent Labs  Lab 03/03/20 0443 03/04/20 0219 03/05/20 0351 03/06/20 0501 03/07/20 0337  NA 141 141 142 142 141  K 3.4* 3.8 3.6 3.5 3.8  CL 98 100 99 100 100  CO2 31 34* 34* 34* 33*  GLUCOSE 83 84 84 92 90  BUN _1 CREATININE 1.09* 1.12* 1.13* 1.14*  1.12*  CALCIUM 7.9* 7.8* 8.0* 7.9* 8.0*  MG  --   --   --  1.8  --     GFR: Estimated Creatinine Clearance: 38.1 mL/min (A) (by C-G formula based on SCr of 1.12 mg/dL (H)).  Liver Function Tests: No results for input(s): AST, ALT, ALKPHOS, BILITOT, PROT, ALBUMIN in the last 168 hours.  CBG: No results for input(s): GLUCAP in the last 168 hours.   Recent Results (from the past 240 hour(s))  SARS Coronavirus 2 by RT PCR (hospital order, performed in [facility] hospital lab) Nasopharyngeal Nasopharyngeal Swab     Status: None   Collection Time: 03/05/20  4:25 PM   Specimen: Nasopharyngeal Swab  Result Value Ref Range Status   SARS Coronavirus 2 NEGATIVE NEGATIVE Final    Comment: (NOTE) SARS-CoV-2 target nucleic acids are NOT DETECTED.  The SARS-CoV-2 RNA is generally detectable in upper and lower respiratory specimens during the acute phase of infection. The lowest concentration of SARS-CoV-2 viral copies this assay can detect is 250  copies / mL. A negative result does not preclude SARS-CoV-2 infection and should not be used as the sole basis for treatment or other patient management decisions.  A negative result may occur with improper specimen collection / handling, submission of specimen other than nasopharyngeal swab, presence of viral mutation(s) within the areas targeted by this assay, and inadequate number of viral copies (<250 copies / mL). A negative result must be combined with clinical observations, patient history, and epidemiological information.  Fact Sheet for Patients:   StrictlyIdeas.no  Fact Sheet for Healthcare Providers: BankingDealers.co.za  This test is not yet approved or  cleared by the [place] FDA and has been authorized for detection and/or diagnosis of SARS-CoV-2 by FDA under an Emergency Use Authorization (EUA).  This EUA will remain in effect (meaning this test can be used) for the duration of  the COVID-19 declaration under Section 564(b)(1) of the Act, 21 U.S.C. section 360bbb-3(b)(1), unless the authorization is terminated or revoked sooner.  Performed at [facility], [place] [address] [place], [place] [address]          Radiology Studies: No results found.      Scheduled Meds: . acetaminophen  650 mg Oral Q8H  . acidophilus  1 capsule Oral Daily  . ambrisentan  10 mg Oral Daily  . amiodarone  200 mg Oral Daily  . apixaban  5 mg Oral BID  . aspirin EC  81 mg Oral Daily  . colchicine  0.3 mg Oral BID  . fluticasone  2 spray Each Nare Daily  . umeclidinium bromide  2 puff Inhalation Daily   And  . fluticasone furoate-vilanterol  2 puff Inhalation Daily  . furosemide  40 mg Oral Daily  . guaiFENesin  600 mg Oral BID  . lidocaine  1 patch Transdermal Q24H  . nystatin  5 mL Oral QID  . pantoprazole  40 mg Oral Q1200  . predniSONE  20 mg Oral Q breakfast   Followed by  . [START ON 03/13/2020] predniSONE  10 mg Oral Q breakfast   Followed by  . [START ON 03/18/2020] predniSONE  5 mg Oral Q breakfast  . tadalafil (PAH)  40 mg Oral Daily   Continuous Infusions:   LOS: 19 days        Hosie Poisson, MD [facility]   To contact the attending provider between 7A-7P or the covering provider during after hours 7P-7A, please log into the web site [email or web address] and access using universal [place] password for that web site. If you do not have the password, please call the hospital operator.  03/09/2020, 2:53 PM

## 2020-03-09 NOTE — Progress Notes (Signed)
Occupational Therapy Treatment Patient Details Name: Linda Stevens MRN: [id number] DOB: 05-28-1946 Today's Date: 03/09/2020    History of present illness Pt adm with acute hypoxic respiratory failure and large pericardial effusion and PNA. PMH - diastolic heart failure, pulmonary htn, copd, htn   OT comments  Patient supine in bed on arrival.  She was very pleasant and agreeable to work with therapy.  Practiced toilet transfer and worked on increasing activity tolerance.  Patient implementing deep breathing and energy conservation strategies without any cueing.  Able to transfer with supervision and walk with RW in room with supervision.  Patient still fatigueing quickly indicating need for further therapy to increase activity tolerance.  Will continue to follow with OT acutely to address the deficits listed below.    Follow Up Recommendations  Home health OT;Supervision - Intermittent    Equipment Recommendations  Tub/shower seat    Recommendations for Other Services      Precautions / Restrictions Precautions Precautions: Other (comment) Precaution Comments: watch SpO2       Mobility Bed Mobility Overal bed mobility: Modified Independent Bed Mobility: Supine to Sit;Sit to Supine     Supine to sit: Modified independent (Device/Increase time);HOB elevated Sit to supine: Modified independent (Device/Increase time);HOB elevated      Transfers Overall transfer level: Needs assistance Equipment used: Rolling walker (2 wheeled) Transfers: Sit to/from [facility] Sit to Stand: Supervision Stand pivot transfers: Supervision       General transfer comment: No RW use for transfer to [facility]    Balance Overall balance assessment: Needs assistance Sitting-balance support: No upper extremity supported;Feet supported Sitting balance-Leahy Scale: Good     Standing balance support: Bilateral upper extremity supported;During functional activity Standing  balance-Leahy Scale: Fair                             ADL either performed or assessed with clinical judgement   ADL Overall ADL's : Needs assistance/impaired                         Toilet Transfer: Supervision/safety;Stand-pivot;BSC           Functional mobility during ADLs: Supervision/safety;Rolling walker General ADL Comments: Worked on energy conservation and breathing methods.      Vision       Perception     Praxis      Cognition Arousal/Alertness: Awake/alert Behavior During Therapy: WFL for tasks assessed/performed Overall Cognitive Status: Within Functional Limits for tasks assessed                                 General Comments: Demonstrating good safety awareness and implementing energy conservation strategies        Exercises     Shoulder Instructions       General Comments Patient on 4L O2    Pertinent Vitals/ Pain       Pain Assessment: No/denies pain  Home Living                                          Prior Functioning/Environment              Frequency  Min 2X/week        Progress Toward Goals  OT Goals(current  goals can now be found in the care plan section)  Progress towards OT goals: Progressing toward goals  Acute Rehab OT Goals Patient Stated Goal: return home OT Goal Formulation: With patient Time For Goal Achievement: 03/21/20 Potential to Achieve Goals: Good  Plan Discharge plan remains appropriate    Co-evaluation                 AM-PAC OT "6 Clicks" Daily Activity     Outcome Measure   Help from another person eating meals?: None Help from another person taking care of personal grooming?: A Little Help from another person toileting, which includes using toliet, bedpan, or urinal?: A Little Help from another person bathing (including washing, rinsing, drying)?: A Little Help from another person to put on and taking off regular upper body  clothing?: A Little Help from another person to put on and taking off regular lower body clothing?: A Little 6 Click Score: 19    End of Session Equipment Utilized During Treatment: Oxygen;Rolling walker  OT Visit Diagnosis: Muscle weakness (generalized) (M62.81);Unsteadiness on feet (R26.81)   Activity Tolerance Patient tolerated treatment well   Patient Left in bed;with call bell/phone within reach   Nurse Communication Mobility status        Time: 1572-6203 OT Time Calculation (min): 14 min  Charges: OT General Charges $OT Visit: 1 Visit OT Treatments $Self Care/Home Management : 8-22 mins  August Luz, OTR/L    Phylliss Bob 03/09/2020, 3:08 PM

## 2020-03-10 DIAGNOSIS — J9621 Acute and chronic respiratory failure with hypoxia: Secondary | ICD-10-CM | POA: Diagnosis not present

## 2020-03-10 DIAGNOSIS — R5381 Other malaise: Secondary | ICD-10-CM | POA: Diagnosis not present

## 2020-03-10 DIAGNOSIS — Z7401 Bed confinement status: Secondary | ICD-10-CM | POA: Diagnosis not present

## 2020-03-10 DIAGNOSIS — I313 Pericardial effusion (noninflammatory): Secondary | ICD-10-CM | POA: Diagnosis not present

## 2020-03-10 DIAGNOSIS — I272 Pulmonary hypertension, unspecified: Secondary | ICD-10-CM | POA: Diagnosis not present

## 2020-03-10 DIAGNOSIS — J189 Pneumonia, unspecified organism: Secondary | ICD-10-CM | POA: Diagnosis not present

## 2020-03-10 DIAGNOSIS — I5033 Acute on chronic diastolic (congestive) heart failure: Secondary | ICD-10-CM | POA: Diagnosis not present

## 2020-03-10 DIAGNOSIS — E785 Hyperlipidemia, unspecified: Secondary | ICD-10-CM | POA: Diagnosis not present

## 2020-03-10 DIAGNOSIS — N178 Other acute kidney failure: Secondary | ICD-10-CM | POA: Diagnosis not present

## 2020-03-10 DIAGNOSIS — D500 Iron deficiency anemia secondary to blood loss (chronic): Secondary | ICD-10-CM | POA: Diagnosis not present

## 2020-03-10 DIAGNOSIS — J9601 Acute respiratory failure with hypoxia: Secondary | ICD-10-CM | POA: Diagnosis not present

## 2020-03-10 DIAGNOSIS — J449 Chronic obstructive pulmonary disease, unspecified: Secondary | ICD-10-CM | POA: Diagnosis not present

## 2020-03-10 DIAGNOSIS — I480 Paroxysmal atrial fibrillation: Secondary | ICD-10-CM | POA: Diagnosis not present

## 2020-03-10 DIAGNOSIS — N1831 Chronic kidney disease, stage 3a: Secondary | ICD-10-CM | POA: Diagnosis not present

## 2020-03-10 DIAGNOSIS — M2550 Pain in unspecified joint: Secondary | ICD-10-CM | POA: Diagnosis not present

## 2020-03-10 DIAGNOSIS — I959 Hypotension, unspecified: Secondary | ICD-10-CM | POA: Diagnosis not present

## 2020-03-10 DIAGNOSIS — J188 Other pneumonia, unspecified organism: Secondary | ICD-10-CM | POA: Diagnosis not present

## 2020-03-10 DIAGNOSIS — I4891 Unspecified atrial fibrillation: Secondary | ICD-10-CM | POA: Diagnosis not present

## 2020-03-10 DIAGNOSIS — Z20822 Contact with and (suspected) exposure to covid-19: Secondary | ICD-10-CM | POA: Diagnosis not present

## 2020-03-10 DIAGNOSIS — D6489 Other specified anemias: Secondary | ICD-10-CM | POA: Diagnosis not present

## 2020-03-10 DIAGNOSIS — I5032 Chronic diastolic (congestive) heart failure: Secondary | ICD-10-CM | POA: Diagnosis not present

## 2020-03-10 DIAGNOSIS — J984 Other disorders of lung: Secondary | ICD-10-CM | POA: Diagnosis not present

## 2020-03-10 DIAGNOSIS — D6949 Other primary thrombocytopenia: Secondary | ICD-10-CM | POA: Diagnosis not present

## 2020-03-10 DIAGNOSIS — J9611 Chronic respiratory failure with hypoxia: Secondary | ICD-10-CM | POA: Diagnosis not present

## 2020-03-10 DIAGNOSIS — K7460 Unspecified cirrhosis of liver: Secondary | ICD-10-CM | POA: Diagnosis not present

## 2020-03-10 LAB — SARS CORONAVIRUS 2 BY RT PCR (HOSPITAL ORDER, PERFORMED IN ~~LOC~~ HOSPITAL LAB): SARS Coronavirus 2: NEGATIVE

## 2020-03-10 MED ORDER — COLCHICINE 0.6 MG PO TABS: 0.3000 mg | ORAL_TABLET | Freq: Two times a day (BID) | ORAL | 1 refills | Status: DC

## 2020-03-10 MED ORDER — PREDNISONE 10 MG PO TABS: 10.0000 mg | ORAL_TABLET | Freq: Every day | ORAL | 0 refills | Status: DC

## 2020-03-10 MED ORDER — FUROSEMIDE 40 MG PO TABS: 40.0000 mg | ORAL_TABLET | Freq: Every day | ORAL | 1 refills | Status: DC

## 2020-03-10 MED ORDER — PANTOPRAZOLE SODIUM 40 MG PO TBEC: 40.0000 mg | DELAYED_RELEASE_TABLET | Freq: Every day | ORAL | 0 refills | Status: DC

## 2020-03-10 MED ORDER — PREDNISONE 20 MG PO TABS: 20.0000 mg | ORAL_TABLET | Freq: Every day | ORAL | 0 refills | Status: DC

## 2020-03-10 MED ORDER — APIXABAN 5 MG PO TABS: 5.0000 mg | ORAL_TABLET | Freq: Two times a day (BID) | ORAL | 1 refills | Status: DC

## 2020-03-10 MED ORDER — AMIODARONE HCL 200 MG PO TABS: 200.0000 mg | ORAL_TABLET | Freq: Every day | ORAL | 1 refills | Status: DC

## 2020-03-10 MED ORDER — GUAIFENESIN ER 600 MG PO TB12: 600.0000 mg | ORAL_TABLET | Freq: Two times a day (BID) | ORAL | 0 refills | Status: DC

## 2020-03-10 MED ORDER — MIDODRINE HCL 2.5 MG PO TABS: 2.5000 mg | ORAL_TABLET | Freq: Three times a day (TID) | ORAL | 0 refills | Status: DC | PRN

## 2020-03-10 MED ORDER — PREDNISONE 5 MG PO TABS: 5.0000 mg | ORAL_TABLET | Freq: Every day | ORAL | 0 refills | Status: DC

## 2020-03-10 NOTE — TOC Progression Note (Signed)
Transition of Care [facility]) - Progression Note    Patient Details  Name: Linda Stevens MRN: [id number] Date of Birth: August 17, 1946  Transition of Care [facility]) CM/SW [place], [place] Phone Number: [phone] 03/10/2020, 2:44 PM  Clinical Narrative:    CSW was alerted that authorization has come back and facility can take patient today. MD alerted and new COVID has been ordered. Plan is to discharge patient today.  TOC team will continue to assist with discharge planning needs.   Expected Discharge Plan: Skilled Nursing Facility Barriers to Discharge: Ship broker, Continued Medical Work up  Expected Discharge Plan and Services Expected Discharge Plan: [place]   Discharge Planning Services: CM Consult Post Acute Care Choice: Durable Medical Equipment, Home Health Living arrangements for the past 2 months: Single Family Home                           HH Arranged: PT, OT HH [facility] Date [place]: 02/29/20 Time [place]: 1030 Representative spoke with at [place]: [place] (SDOH) Interventions    Readmission Risk Interventions No flowsheet data found.

## 2020-03-10 NOTE — Progress Notes (Signed)
Report called to RUPERT, RN at [facility].

## 2020-03-10 NOTE — TOC Transition Note (Addendum)
Transition of Care [facility]) - CM/SW Discharge Note   Patient Details  Name: Linda Stevens MRN: [id number] Date of Birth: [birth year]  Transition of Care [facility]) CM/SW Contact:  Bary Castilla, LCSW Phone Number: [phone] 03/10/2020, 4:56 PM   Clinical Narrative:     Patient will DC to:?[facility] Anticipated DC date:?03/10/20 Family notified:?Angie Transport RS:WNIO   Per MD patient ready for DC to [facility]. RN, patient, patient's family, and facility notified of DC. Discharge Summary sent to facility. RN given number for report [phone] room 210. DC packet on chart. Ambulance transport requested for patient.   CSW signing off.   [place], [place] [phone]   Final next level of care: Skilled Nursing Facility Barriers to Discharge: No Barriers Identified   Patient Goals and CMS Choice Patient states their goals for this hospitalization and ongoing recovery are:: per husband to get better and go home CMS Medicare.gov Compare Post Acute Care list provided to:: Patient Choice offered to / list presented to : Patient  Discharge Placement              Patient [facility]: [facility] Patient to be transferred to facility by: [facility] Name of family member notified: [name] Patient and family notified of of transfer: 03/10/20  Discharge Plan and Services   Discharge Planning Services: CM Consult Post Acute Care Choice: Durable Medical Equipment, Home Health                    HH Arranged: PT, OT [facility] Agency: [facility] Date [place]: 02/29/20 Time [place]: 57 Representative spoke with at [place]: [place] ([place]) Interventions     Readmission Risk Interventions No flowsheet data found.

## 2020-03-10 NOTE — TOC Progression Note (Signed)
Transition of Care [facility]) - Progression Note    Patient Details  Name: Linda Stevens MRN: [id number] Date of Birth: Jun 08, 1946  Transition of Care [facility]) CM/SW [place], [place] Phone Number: [phone] 03/10/2020, 2:04 PM  Clinical Narrative:     CSW spoke with facility and was informed that authorization is still pending. CSW asked to be alerted if authorization comes back. Patient will need a new COVID within 48 hours of discharge. CSW updated MD on discharge status.  TOC team will continue to assist with discharge planning needs.   Expected Discharge Plan: Skilled Nursing Facility Barriers to Discharge: Ship broker, Continued Medical Work up  Expected Discharge Plan and Services Expected Discharge Plan: [place]   Discharge Planning Services: CM Consult Post Acute Care Choice: Durable Medical Equipment, Home Health Living arrangements for the past 2 months: Single Family Home                           HH Arranged: PT, OT [facility] Date [place]: 02/29/20 Time [place]: 1030 Representative spoke with at [place]: [place] (SDOH) Interventions    Readmission Risk Interventions No flowsheet data found.

## 2020-03-10 NOTE — Discharge Summary (Signed)
Physician Discharge Summary  Linda Stevens QMV:[id number] DOB: 07-Dec-1945 DOA: 02/19/2020  PCP: [name], MD  Admit date: 02/19/2020 Discharge date: 03/10/2020  Admitted From: [facility] Disposition: SNF  Recommendations for Outpatient Follow-up:  1. Follow up with PCP in 1-2 weeks 2. Please obtain BMP/CBC in one week   Discharge Condition:STABLE CODE STATUS: DNR Diet recommendation: Heart Healthy  Brief/Interim Summary: 74 year old lady with prior history of COPD, chronic respiratory failure on 3 L of nasal cannula oxygen, chronic diastolic heart failure, pulmonary hypertension, hyper lipidemia, essential hypertension presents to ED for shortness of breath.  Found to have large pericardial effusion without evidence of tamponade.  Cardiology consulted and recommended no indication for pericardiocentesis at this time. Hospital course was complicated by atrial fibrillation with hypotension 02/20/2020 he was started on amiodarone and she converted to sinus rhythm. Discussed the plan for discharge to snf and we are wiating for insurance authorization.   Discharge Diagnoses:  Principal Problem:   Positive blood cultures Active Problems:   Goals of care, counseling/discussion   Congestive heart failure (CHF) (HCC)   Pulmonary artery hypertension (HCC)   Acute on chronic diastolic congestive heart failure (HCC)   Acute on chronic respiratory failure with hypoxia (HCC)   Pericardial effusion   Acute respiratory failure with hypoxia and hypercapnea   Hepatic cirrhosis (HCC)   Pleural effusion, bilateral   Community acquired pneumonia   New onset atrial fibrillation ([facility])   Palliative care by specialist   DNR (do not resuscitate)  Large pericardial effusion without evidence of tamponade Repeat echocardiogram showed improvement.  Cardiology consulted and recommended no indication for pericardiocentesis or pericardial window at this time. Rheumatology work-up has been negative so  far. Recommend to continue colchicine for a total of 3 months.  And plan to steroid taper over 3 weeks.  Repeat echocardiogram in 6 weeks. Patient denies shortness of breath at this time,  she is on 3 L of nasal cannula oxygen with good oxygen saturations.   Pulmonary hypertension Stable Resume tadalafil and ambrisentan.   Chronic diastolic heart failure Echo showed preserved left ventricular function and grade 1 diastolic dysfunction. Patient is currently euvolemic Continue with Lasix 40 mg daily. Continue to monitor strict intake and output and daily weights.   Paroxysmal atrial fibrillation initially treated with amiodarone and currently in sinus rhythm. Currently on Eliquis for anticoagulation. Echocardiogram shows preserved left ventricular ejection fraction and shows grade 1 diastolic dysfunction.  CHA2DS2-VASc score is about 3    Anemia of chronic disease Hemoglobin stable between 11-10.  Acquired thrombophilia Patient is currently on Eliquis continue the same for stroke prophylaxis.   Hypokalemia Replaced.  Acute on chronic kidney disease stage IIIa Creatinine stable at 1.1.  No new labs ordered today. .  Acute metabolic encephalopathy Resolved.      Community-acquired pneumonia Completed the course of IV antibiotics.    Thrombocytopenia Unclear etiology probably secondary to infection versus medication.  Continue to monitor and recommend outpatient follow-up with hematology if platelets continues to decline.   Body mass index is 21.84 kg/m.    In view of multiple medical problems palliative care consulted for goals of care discussion.  Family at this time would like to consider rehab on discharge.  PT evaluation recommended SNF.  Patient is currently medically stable for discharge awaiting for SNF bed.        Discharge Instructions  Discharge Instructions    Diet - low sodium heart healthy   Complete by: As  directed  Discharge instructions   Complete by: As directed    Follow up with cardiology as scheduled.     Allergies as of 03/10/2020      Reactions   Amoxicillin-pot Clavulanate    REACTION: gi upset Has patient had a PCN reaction causing immediate rash, facial/tongue/throat swelling, SOB or lightheadedness with hypotension: No Has patient had a PCN reaction causing severe rash involving mucus membranes or skin necrosis: No Has patient had a PCN reaction that required hospitalization : No Has patient had a PCN reaction occurring within the last 10 years: No If all of the above answers are "NO", then may proceed with Cephalosporin use.   Cefdinir    REACTION: gi  upset   Moxifloxacin    REACTION: rash   Singulair [montelukast Sodium]    Itching      Medication List    STOP taking these medications   azithromycin 250 MG tablet Commonly known as: ZITHROMAX   clotrimazole 10 MG troche Commonly known as: MYCELEX   spironolactone 25 MG tablet Commonly known as: ALDACTONE     TAKE these medications   acetaminophen 325 MG tablet Commonly known as: TYLENOL Take 325 mg by mouth at bedtime as needed for moderate pain or headache.   albuterol 108 (90 Base) MCG/ACT inhaler Commonly known as: VENTOLIN HFA Inhale 2 puffs every 6 (six) hours as needed into the lungs for wheezing or shortness of breath.   ambrisentan 10 MG tablet Commonly known as: LETAIRIS TAKE 1 TABLET (10MG) BY MOUTH DAILY. DO NOT HANDLE IF PREGNANT. DO NOTSPLIT, CRUSH, OR CHEW. AVOID INHALATION AND CONTACT WITH SKIN OR EYES . CALL [phone] TO REFILL. What changed:   how much to take  how to take this  when to take this  additional instructions   amiodarone 200 MG tablet Commonly known as: PACERONE Take 1 tablet (200 mg total) by mouth daily. Start taking on: March 11, 2020   apixaban 5 MG Tabs tablet Commonly known as: ELIQUIS Take 1 tablet (5 mg total) by mouth 2 (two) times daily.    aspirin 81 MG EC tablet Take 1 tablet (81 mg total) by mouth daily.   colchicine 0.6 MG tablet Take 0.5 tablets (0.3 mg total) by mouth 2 (two) times daily.   fluticasone 50 MCG/ACT nasal spray Commonly known as: FLONASE USE 2 SPRAYS IN EAXH NOSTRIL ONCE A DAY What changed:   how much to take  how to take this  when to take this  additional instructions   furosemide 40 MG tablet Commonly known as: LASIX Take 1 tablet (40 mg total) by mouth daily. Start taking on: March 11, 2020 What changed: when to take this   guaiFENesin 600 MG 12 hr tablet Commonly known as: MUCINEX Take 1 tablet (600 mg total) by mouth 2 (two) times daily.   ipratropium 0.06 % nasal spray Commonly known as: ATROVENT PLACE 2 SPRAYS INTO BOTH NOSTRILS 2 (TWO) TIMES DAILY   midodrine 2.5 MG tablet Commonly known as: PROAMATINE Take 1 tablet (2.5 mg total) by mouth 3 (three) times daily as needed (SBP<100).   OXYGEN 2 to 2lpm 24/7   pantoprazole 40 MG tablet Commonly known as: PROTONIX Take 1 tablet (40 mg total) by mouth daily at 12 noon. Start taking on: March 11, 2020   predniSONE 20 MG tablet Commonly known as: DELTASONE Take 1 tablet (20 mg total) by mouth daily with breakfast. Start taking on: March 11, 2020 What changed:   medication strength  how much to take  how to take this  when to take this  additional instructions   predniSONE 10 MG tablet Commonly known as: DELTASONE Take 1 tablet (10 mg total) by mouth daily with breakfast. Start taking on: March 13, 2020 What changed: You were already taking a medication with the same name, and this prescription was added. Make sure you understand how and when to take each.   predniSONE 5 MG tablet Commonly known as: DELTASONE Take 1 tablet (5 mg total) by mouth daily with breakfast. Start taking on: March 18, 2020 What changed: You were already taking a medication with the same name, and this prescription was added. Make sure you  understand how and when to take each.   SYSTANE OP Apply 1-2 drops to eye daily as needed (dry eyes).   tadalafil (PAH) 20 MG tablet Commonly known as: Adcirca Take 2 tablets (40 mg total) by mouth daily.   Trelegy Ellipta 100-62.5-25 MCG/INH Aepb Generic drug: Fluticasone-Umeclidin-Vilant INHALE 2 PUFFS INTO THE LUNGS EVERY DAY What changed: See the new instructions.       Contact information for follow-up providers    [place] up.   Specialty: Home Health Services Why: A representative from [facility] will contact you to arrange start date and time for [place] information: [place] [address] [phone]        [name], MD. Schedule an appointment as soon as possible for a visit in 1 week(s).   Specialty: Family Medicine Contact information: [place] [address] [phone]        Bensimhon, Shaune Pascal, MD .   Specialty: Cardiology Contact information: [place] [address] [phone]            Contact information for after-discharge care    Destination    [facility] .   Service: Skilled Nursing Contact information: [place] [phone]                 Allergies  Allergen Reactions  . Amoxicillin-Pot Clavulanate     REACTION: gi upset Has patient had a PCN reaction causing immediate rash, facial/tongue/throat swelling, SOB or lightheadedness with hypotension: No Has patient had a PCN reaction causing severe rash involving mucus membranes or skin necrosis: No Has patient had a PCN reaction that required hospitalization : No Has patient had a PCN reaction occurring within the last 10 years: No If all of the above answers are "NO", then may proceed with Cephalosporin use.   . Cefdinir     REACTION: gi  upset  . Moxifloxacin     REACTION: rash   . Singulair [Montelukast Sodium]     Itching     Consultations:  cardiology   Procedures/Studies: CT Angio Chest PE W and/or Wo Contrast  Result Date: 02/19/2020 CLINICAL DATA:  Shortness of breath EXAM: CT ANGIOGRAPHY CHEST WITH CONTRAST TECHNIQUE: Multidetector CT imaging of the chest was performed using the standard protocol during bolus administration of intravenous contrast. Multiplanar CT image reconstructions and MIPs were obtained to evaluate the vascular anatomy. CONTRAST:  80 mL Omnipaque 350 nonionic COMPARISON:  Chest CT July 24, 2017; chest radiograph February 19, 2020 FINDINGS: Cardiovascular: There is no demonstrable pulmonary embolus. There is no thoracic aortic aneurysm. No dissection seen. Note that the contrast bolus in the aorta is not sufficient for  confident dissection assessment. There is calcification at multiple sites in visualized great vessels. There are multiple foci of aortic atherosclerosis. There is a sizable pericardial effusion. The pericardium does not appear appreciably thickened. There are foci of coronary artery calcification. The main pulmonary outflow tract measures 3.2 cm, prominent. Mediastinum/Nodes: Thyroid appears unremarkable. There is no appreciable thoracic adenopathy. No esophageal lesions are evident. Lungs/Pleura: There is underlying centrilobular emphysematous change. There are pleural effusions bilaterally with airspace consolidation in each lower lobe with associated areas of atelectatic change. Upper Abdomen: There is upper abdominal aortic atherosclerosis. Visualized upper abdominal structures otherwise appear unremarkable. Musculoskeletal: There are foci degenerative change in the thoracic spine. No blastic or lytic bone lesions are evident. There are no chest wall lesions. Review of the MIP images confirms the above findings. IMPRESSION: 1. No demonstrable pulmonary embolus. No thoracic aortic aneurysm. No dissection seen with note that contrast  bolus in the aorta is not sufficient for confident dissection assessment. There are foci of aortic atherosclerosis as well as foci of great vessel and coronary artery calcification. 2.  Sizable pericardial effusion. 3. Bilateral pleural effusions with consolidation, likely pneumonia, in the bases as well as bibasilar atelectasis. There is underlying centrilobular emphysematous change. 4. Prominence of the main pulmonary outflow tract is indicative of pulmonary arterial hypertension. 5.  No evident adenopathy. Aortic Atherosclerosis (ICD10-I70.0) and Emphysema (ICD10-J43.9). Electronically Signed   By: Lowella Grip III M.D.   On: 02/19/2020 09:46   CT ABDOMEN PELVIS W CONTRAST  Result Date: 02/29/2020 CLINICAL DATA:  Epigastric and left upper quadrant abdominal pain. EXAM: CT ABDOMEN AND PELVIS WITH CONTRAST TECHNIQUE: Multidetector CT imaging of the abdomen and pelvis was performed using the standard protocol following bolus administration of intravenous contrast. CONTRAST:  110m OMNIPAQUE IOHEXOL 300 MG/ML  SOLN COMPARISON:  Included portion from chest CT 02/19/2020, no prior abdominal imaging available. FINDINGS: Lower chest: Diminished pericardial effusion from prior exam, small residual. Small bilateral pleural effusions, enhancing and partially loculated on the right. Adjacent bilateral lower lobe airspace opacities. Hepatobiliary: No focal liver lesion is seen. Equivocal lobulated hepatic contours. Clips in the gallbladder fossa postcholecystectomy. No biliary dilatation. Pancreas: No ductal dilatation or inflammation. Spleen: Normal in size without focal abnormality. No splenic infarct. Adrenals/Urinary Tract: No adrenal nodule. No hydronephrosis or perinephric edema. Homogeneous renal enhancement with symmetric excretion on delayed phase imaging. Urinary bladder is physiologically distended without wall thickening. Stomach/Bowel: The stomach is unremarkable. There is no small bowel obstruction,  administered enteric contrast reaches the colon. No small bowel inflammation. Normal appendix. Left colonic diverticulosis without diverticulitis. There is no colonic wall thickening or inflammatory change. Vascular/Lymphatic: Aorto bi-iliac atherosclerosis. No aortic aneurysm. Patent portal vein. No adenopathy. Reproductive: Uterus and bilateral adnexa are unremarkable. Other: Small volume abdominopelvic free fluid, and both upper quadrants, right pericolic gutter, and in the pelvis. No free air. No focal fluid collection in the abdomen. Small umbilical hernia. Subcutaneous edema in bilateral flanks. Musculoskeletal: There are no acute or suspicious osseous abnormalities. IMPRESSION: 1. No acute abnormality in the abdomen/pelvis. 2. Small volume abdominopelvic free fluid/ascites. 3. Small bilateral pleural effusions, enhancing and partially loculated on the right. Adjacent bilateral lower lobe airspace opacities. Similar appearance was seen on recent chest CTA. Pericardial effusion has diminished. 4. Colonic diverticulosis without diverticulitis. 5. Equivocal lobulated hepatic contours, can be seen with cirrhosis. Recommend correlation with cirrhosis risk factors. Aortic Atherosclerosis (ICD10-I70.0). Electronically Signed   By: MKeith RakeM.D.   On: 02/29/2020 00:20   DG Chest  Port 1 View  Result Date: 02/19/2020 CLINICAL DATA:  Shortness of breath EXAM: PORTABLE CHEST 1 VIEW COMPARISON:  12/09/2019 FINDINGS: Moderate cardiomegaly with small pleural effusions and bibasilar atelectasis. No overt pulmonary edema. Calcific aortic atherosclerosis. IMPRESSION: Cardiomegaly with small pleural effusions and bibasilar atelectasis. Electronically Signed   By: Ulyses Jarred M.D.   On: 02/19/2020 04:35   ECHOCARDIOGRAM COMPLETE  Result Date: 02/19/2020    ECHOCARDIOGRAM REPORT   Patient Name:   KIANNAH GRUNOW Date of Exam: 02/19/2020 Medical Rec #:  [id number]           Height:       63.0 in Accession #:     [id number]          Weight:       135.0 lb Date of Birth:  09-05-1946           BSA:          1.636 m Patient Age:    79 years            BP:           104/65 mmHg Patient Gender: F                   HR:           88 bpm. Exam Location:  Inpatient Procedure: 2D Echo, Cardiac Doppler and Color Doppler STAT ECHO                            MODIFIED REPORT: This report was modified by Dorris Carnes MD on 02/19/2020 due to Complete.  Indications:     Pericardial effusion 423.9 / I31.3  History:         Patient has prior history of Echocardiogram examinations, most                  recent 12/09/2019. CHF, COPD; Risk Factors:Hypertension and                  Current Smoker. Chronic respiratory failure with hypoxia.  Sonographer:     Alvino Chapel RCS Referring Phys:  [id number] [place] Diagnosing Phys: Dorris Carnes MD IMPRESSIONS  1. Left ventricular ejection fraction, by estimation, is 55 to 60%. The left ventricle has normal function. The left ventricle has no regional wall motion abnormalities. There is moderate left ventricular hypertrophy. Left ventricular diastolic parameters are consistent with Grade I diastolic dysfunction (impaired relaxation).  2. Right ventricular systolic function is mildly reduced. The right ventricular size is normal. Mildly increased right ventricular wall thickness.  3. A large pericardial effusions surrounds heart, mor prominent in the inferior and lateral surfaces There is no significant respiratory variation in mitral inflow, ther RV/RA do not collapse; the IVC is dilated however with decreased respiratory variation.     Ther is some soft tissue attenuation within this, may reflect some pericardial fat vs consolidation. Compared to echo from April 2021 this is new. Effusioon was trivial at that time Clinical correlation indicated. . Large pericardial effusion. The pericardial effusion is circumferential.  4. The mitral valve is grossly normal. Mild mitral valve regurgitation.  5. The aortic  valve is abnormal. Aortic valve regurgitation is not visualized. Mild aortic valve sclerosis is present, with no evidence of aortic valve stenosis.  6. The inferior vena cava is dilated in size with <50% respiratory variability, suggesting right atrial pressure of 15 mmHg. FINDINGS  Left Ventricle: Left  ventricular ejection fraction, by estimation, is 55 to 60%. The left ventricle has normal function. The left ventricle has no regional wall motion abnormalities. The left ventricular internal cavity size was small. There is moderate  left ventricular hypertrophy. Left ventricular diastolic parameters are consistent with Grade I diastolic dysfunction (impaired relaxation). Right Ventricle: The right ventricular size is normal. Mildly increased right ventricular wall thickness. Right ventricular systolic function is mildly reduced. Left Atrium: Left atrial size was normal in size. Right Atrium: Right atrial size was normal in size. Pericardium: A large pericardial effusions surrounds heart, mor prominent in the inferior and lateral surfaces There is no significant respiratory variation in mitral inflow, ther RV/RA do not collapse; the IVC is dilated however with decreased respiratory variation. Ther is some soft tissue attenuation within this, may reflect some pericardial fat vs consolidation. Compared to echo from April 2021 this is new. Effusioon was trivial at that time Clinical correlation indicated. A large pericardial effusion is present.  The pericardial effusion is circumferential. Mitral Valve: The mitral valve is grossly normal. Mild mitral valve regurgitation. Tricuspid Valve: The tricuspid valve is normal in structure. Tricuspid valve regurgitation is trivial. Aortic Valve: The aortic valve is abnormal. Aortic valve regurgitation is not visualized. Mild aortic valve sclerosis is present, with no evidence of aortic valve stenosis. Pulmonic Valve: The pulmonic valve was not well visualized. Pulmonic valve  regurgitation is not visualized. Aorta: The aortic root and ascending aorta are structurally normal, with no evidence of dilitation. Venous: The inferior vena cava is dilated in size with less than 50% respiratory variability, suggesting right atrial pressure of 15 mmHg. IAS/Shunts: No atrial level shunt detected by color flow Doppler.  LEFT VENTRICLE PLAX 2D LVIDd:         3.60 cm  Diastology LVIDs:         2.50 cm  LV e' lateral:   5.22 cm/s LV PW:         1.00 cm  LV E/e' lateral: 16.3 LV IVS:        0.90 cm  LV e' medial:    8.05 cm/s LVOT diam:     1.80 cm  LV E/e' medial:  10.6 LV SV:         56 LV SV Index:   35 LVOT Area:     2.54 cm  RIGHT VENTRICLE RV S prime:     11.00 cm/s TAPSE (M-mode): 1.6 cm LEFT ATRIUM             Index       RIGHT ATRIUM           Index LA Vol (A2C):   54.2 ml 33.13 ml/m RA Area:     15.00 cm LA Vol (A4C):   45.3 ml 27.69 ml/m RA Volume:   35.30 ml  21.57 ml/m LA Biplane Vol: 51.6 ml 31.54 ml/m  AORTIC VALVE LVOT Vmax:   107.00 cm/s LVOT Vmean:  63.200 cm/s LVOT VTI:    0.222 m MITRAL VALVE MV Area (PHT): 3.77 cm    SHUNTS MV Decel Time: 201 msec    Systemic VTI:  0.22 m MV E velocity: 85.20 cm/s  Systemic Diam: 1.80 cm MV A velocity: 93.60 cm/s MV E/A ratio:  0.91 Dorris Carnes MD Electronically signed by Dorris Carnes MD Signature Date/Time: 02/19/2020/1:33:51 PM    Final (Updated)    ECHOCARDIOGRAM LIMITED  Result Date: 02/24/2020    ECHOCARDIOGRAM LIMITED REPORT   Patient Name:   Hallelujah T  [place] Date of Exam: 02/24/2020 Medical Rec #:  [id number]           Height:       63.0 in Accession #:    [id number]          Weight:       143.5 lb Date of Birth:  November 19, 1945           BSA:          1.679 m Patient Age:    24 years            BP:           106/48 mmHg Patient Gender: F                   HR:           63 bpm. Exam Location:  Inpatient Procedure: Limited Echo, Limited Color Doppler and Cardiac Doppler Indications:    Pericardial Effusion I31.3  History:        Patient  has prior history of Echocardiogram examinations, most                 recent 02/21/2020. CHF, COPD and Pulmonary HTN; Risk                 Factors:Hypertension.  Sonographer:    Mikki Santee RDCS (AE) Referring Phys: Echo  1. Left ventricular ejection fraction, by estimation, is 65 to 70%. The left ventricle has normal function. The left ventricle has no regional wall motion abnormalities. Left ventricular diastolic function could not be evaluated.  2. Right ventricular systolic function is normal. The right ventricular size is normal. There is moderately elevated pulmonary artery systolic pressure. The estimated right ventricular systolic pressure is 67.2 mmHg.  3. Small circumferential pericardial effusion. There is no evidence of cardiac tamponade.     The epicardial layer appears diffusely thickened, consistent with inflammatory pericardial disease.  4. The mitral valve is normal in structure.  5. The aortic valve is normal in structure.  6. The inferior vena cava is dilated in size with <50% respiratory variability, suggesting right atrial pressure of 15 mmHg. Comparison(s): Prior images reviewed side by side. There is substantial reduction in the size of the pericardial effusion. FINDINGS  Left Ventricle: Left ventricular ejection fraction, by estimation, is 65 to 70%. The left ventricle has normal function. The left ventricle has no regional wall motion abnormalities. There is no left ventricular hypertrophy. Right Ventricle: The right ventricular size is normal. No increase in right ventricular wall thickness. Right ventricular systolic function is normal. There is moderately elevated pulmonary artery systolic pressure. The tricuspid regurgitant velocity is 2.99 m/s, and with an assumed right atrial pressure of 15 mmHg, the estimated right ventricular systolic pressure is 09.4 mmHg. Left Atrium: Left atrial size was normal in size. Right Atrium: Right atrial size was  normal in size. Pericardium: The epicardial layer appears diffusely thickened, consistent with inflammatory pericardial disease. A small pericardial effusion is present. The pericardial effusion is circumferential. There is no evidence of cardiac tamponade. Mitral Valve: The mitral valve is normal in structure. Tricuspid Valve: The tricuspid valve is normal in structure. Aortic Valve: The aortic valve is normal in structure. Pulmonic Valve: The pulmonic valve was not well visualized. Venous: The inferior vena cava is dilated in size with less than 50% respiratory variability, suggesting right atrial pressure of 15 mmHg.  TRICUSPID VALVE TR Peak grad:   35.8 mmHg TR Vmax:  299.00 cm/s Sanda Klein MD Electronically signed by Sanda Klein MD Signature Date/Time: 02/24/2020/11:59:12 AM    Final    ECHOCARDIOGRAM LIMITED  Result Date: 02/21/2020    ECHOCARDIOGRAM LIMITED REPORT   Patient Name:   DOROTHEY OETKEN Date of Exam: 02/21/2020 Medical Rec #:  [id number]           Height:       63.0 in Accession #:    [id number]          Weight:       138.9 lb Date of Birth:  1946/08/31           BSA:          1.656 m Patient Age:    33 years            BP:           124/79 mmHg Patient Gender: F                   HR:           146 bpm. Exam Location:  Inpatient Procedure: Limited Echo, Limited Color Doppler and Cardiac Doppler Indications:    Pericardial Effusion I31.3  History:        Patient has prior history of Echocardiogram examinations, most                 recent 02/19/2020. CHF; COPD and Pulmonary HTN.  Sonographer:    Mikki Santee RDCS (AE) Referring Phys: [id number] AMY D CLEGG IMPRESSIONS  1. Left ventricular ejection fraction, by estimation, is >75%. The left ventricle has hyperdynamic function. The left ventricle has no regional wall motion abnormalities. Left ventricular diastolic function could not be evaluated.  2. Right ventricular systolic function is normal. The right ventricular size is  normal. Mildly increased right ventricular wall thickness. Tricuspid regurgitation signal is inadequate for assessing PA pressure.  3. Large pericardial effusion. The pericardial effusion is circumferential.  4. The mitral valve is normal in structure. No evidence of mitral valve regurgitation.  5. The inferior vena cava is dilated in size with <50% respiratory variability, suggesting right atrial pressure of 15 mmHg. Comparison(s): Prior images reviewed side by side. Atrial fibrillation is new (and limits the evaluation of ventricular interdependence based on mitral inflow respiratory variation). Otherwise there are no changes: the pericardial effusion is large and the inferior vena cava is plethoric, but there is no diastolic chamber collapse to support a diagnosis of tamponade. FINDINGS  Left Ventricle: Left ventricular ejection fraction, by estimation, is >75%. The left ventricle has hyperdynamic function. The left ventricle has no regional wall motion abnormalities. The left ventricular internal cavity size was normal in size. There is no left ventricular hypertrophy. Left ventricular diastolic function could not be evaluated due to atrial fibrillation. Right Ventricle: The right ventricular size is normal. Mildly increased right ventricular wall thickness. Right ventricular systolic function is normal. Tricuspid regurgitation signal is inadequate for assessing PA pressure. Left Atrium: Left atrial size was normal in size. Right Atrium: Right atrial size was normal in size. Pericardium: A large pericardial effusion is present. The pericardial effusion is circumferential. Mitral Valve: The mitral valve is normal in structure. Tricuspid Valve: The tricuspid valve is normal in structure. Tricuspid valve regurgitation is not demonstrated. Pulmonic Valve: The pulmonic valve was not well visualized. Venous: The inferior vena cava is dilated in size with less than 50% respiratory variability, suggesting right atrial  pressure of 15 mmHg. Sanda Klein MD Electronically  signed by Sanda Klein MD Signature Date/Time: 02/21/2020/3:47:15 PM    Final       Subjective: No new complaints.   Discharge Exam: Vitals:   03/10/20 1000 03/10/20 1449  BP: (!) 110/43 (!) 118/45  Pulse: 74 82  Resp:  20  Temp:  98.2 F (36.8 C)  SpO2:  97%   Vitals:   03/10/20 0805 03/10/20 0806 03/10/20 1000 03/10/20 1449  BP:   (!) 110/43 (!) 118/45  Pulse:   74 82  Resp:    20  Temp:    98.2 F (36.8 C)  TempSrc:    Oral  SpO2: 97% 97%  97%  Weight:      Height:        General: Pt is alert, awake, not in acute distress Cardiovascular: RRR, S1/S2 +, no rubs, no gallops Respiratory: CTA bilaterally, no wheezing, no rhonchi Abdominal: Soft, NT, ND, bowel sounds + Extremities: no edema, no cyanosis    The results of significant diagnostics from this hospitalization (including imaging, microbiology, ancillary and laboratory) are listed below for reference.     Microbiology: Recent Results (from the past 240 hour(s))  SARS Coronavirus 2 by RT PCR (hospital order, performed in [facility] hospital lab) Nasopharyngeal Nasopharyngeal Swab     Status: None   Collection Time: 03/05/20  4:25 PM   Specimen: Nasopharyngeal Swab  Result Value Ref Range Status   SARS Coronavirus 2 NEGATIVE NEGATIVE Final    Comment: (NOTE) SARS-CoV-2 target nucleic acids are NOT DETECTED.  The SARS-CoV-2 RNA is generally detectable in upper and lower respiratory specimens during the acute phase of infection. The lowest concentration of SARS-CoV-2 viral copies this assay can detect is 250 copies / mL. A negative result does not preclude SARS-CoV-2 infection and should not be used as the sole basis for treatment or other patient management decisions.  A negative result may occur with improper specimen collection / handling, submission of specimen other than nasopharyngeal swab, presence of viral mutation(s) within the areas  targeted by this assay, and inadequate number of viral copies (<250 copies / mL). A negative result must be combined with clinical observations, patient history, and epidemiological information.  Fact Sheet for Patients:   StrictlyIdeas.no  Fact Sheet for Healthcare Providers: BankingDealers.co.za  This test is not yet approved or  cleared by the [place] FDA and has been authorized for detection and/or diagnosis of SARS-CoV-2 by FDA under an Emergency Use Authorization (EUA).  This EUA will remain in effect (meaning this test can be used) for the duration of the COVID-19 declaration under Section 564(b)(1) of the Act, 21 U.S.C. section 360bbb-3(b)(1), unless the authorization is terminated or revoked sooner.  Performed at [facility], [place] [address] [place], [place] [address]      Labs: BNP (last 3 results) Recent Labs    05/12/19 1224 12/09/19 1239 02/19/20 0405  BNP 272.8* 324.2* 242.9*   Basic Metabolic Panel: Recent Labs  Lab 03/04/20 0219 03/05/20 0351 03/06/20 0501 03/07/20 0337  NA 141 142 142 141  K 3.8 3.6 3.5 3.8  CL 100 99 100 100  CO2 34* 34* 34* 33*  GLUCOSE 84 84 92 90  BUN _0 CREATININE 1.12* 1.13* 1.14* 1.12*  CALCIUM 7.8* 8.0* 7.9* 8.0*  MG  --   --  1.8  --    Liver Function Tests: No results for input(s): AST, ALT, ALKPHOS, BILITOT, PROT, ALBUMIN in the last 168 hours. No results  for input(s): LIPASE, AMYLASE in the last 168 hours. No results for input(s): AMMONIA in the last 168 hours. CBC: Recent Labs  Lab 03/04/20 0219 03/05/20 0351 03/06/20 0501 03/07/20 0337  WBC 9.0 8.6 8.2 7.9  HGB 10.7* 10.3* 10.6* 9.9*  HCT 34.4* 33.5* 34.1* 32.2*  MCV 99.4 101.2* 100.6* 99.1  PLT 119* 120* 117* 116*   Cardiac Enzymes: No results for input(s): CKTOTAL, CKMB, CKMBINDEX, TROPONINI in the last 168 hours. BNP: Invalid input(s): POCBNP CBG: No results for input(s): GLUCAP  in the last 168 hours. D-Dimer No results for input(s): DDIMER in the last 72 hours. Hgb A1c No results for input(s): HGBA1C in the last 72 hours. Lipid Profile No results for input(s): CHOL, HDL, LDLCALC, TRIG, CHOLHDL, LDLDIRECT in the last 72 hours. Thyroid function studies No results for input(s): TSH, T4TOTAL, T3FREE, THYROIDAB in the last 72 hours.  Invalid input(s): FREET3 Anemia work up No results for input(s): VITAMINB12, FOLATE, FERRITIN, TIBC, IRON, RETICCTPCT in the last 72 hours. Urinalysis    Component Value Date/Time   COLORURINE YELLOW 02/23/2020 1319   APPEARANCEUR HAZY (A) 02/23/2020 1319   LABSPEC 1.019 02/23/2020 1319   PHURINE 5.0 02/23/2020 1319   GLUCOSEU NEGATIVE 02/23/2020 1319   GLUCOSEU NEGATIVE 07/07/2017 1625   HGBUR NEGATIVE 02/23/2020 1319   HGBUR negative 12/22/2007 1139   BILIRUBINUR NEGATIVE 02/23/2020 1319   KETONESUR NEGATIVE 02/23/2020 1319   PROTEINUR NEGATIVE 02/23/2020 1319   UROBILINOGEN 0.2 07/07/2017 1625   NITRITE NEGATIVE 02/23/2020 1319   LEUKOCYTESUR NEGATIVE 02/23/2020 1319   Sepsis Labs Invalid input(s): PROCALCITONIN,  WBC,  LACTICIDVEN Microbiology Recent Results (from the past 240 hour(s))  SARS Coronavirus 2 by RT PCR (hospital order, performed in [facility] hospital lab) Nasopharyngeal Nasopharyngeal Swab     Status: None   Collection Time: 03/05/20  4:25 PM   Specimen: Nasopharyngeal Swab  Result Value Ref Range Status   SARS Coronavirus 2 NEGATIVE NEGATIVE Final    Comment: (NOTE) SARS-CoV-2 target nucleic acids are NOT DETECTED.  The SARS-CoV-2 RNA is generally detectable in upper and lower respiratory specimens during the acute phase of infection. The lowest concentration of SARS-CoV-2 viral copies this assay can detect is 250 copies / mL. A negative result does not preclude SARS-CoV-2 infection and should not be used as the sole basis for treatment or other patient management decisions.  A negative result may  occur with improper specimen collection / handling, submission of specimen other than nasopharyngeal swab, presence of viral mutation(s) within the areas targeted by this assay, and inadequate number of viral copies (<250 copies / mL). A negative result must be combined with clinical observations, patient history, and epidemiological information.  Fact Sheet for Patients:   StrictlyIdeas.no  Fact Sheet for Healthcare Providers: BankingDealers.co.za  This test is not yet approved or  cleared by the [place] FDA and has been authorized for detection and/or diagnosis of SARS-CoV-2 by FDA under an Emergency Use Authorization (EUA).  This EUA will remain in effect (meaning this test can be used) for the duration of the COVID-19 declaration under Section 564(b)(1) of the Act, 21 U.S.C. section 360bbb-3(b)(1), unless the authorization is terminated or revoked sooner.  Performed at [facility], [place] [address] [place], [place] [address]      Time coordinating discharge: 32 minutes.   SIGNED:   Hosie Poisson, MD  [facility] 03/10/2020, 3:27 PM

## 2020-03-14 DIAGNOSIS — D6949 Other primary thrombocytopenia: Secondary | ICD-10-CM | POA: Diagnosis not present

## 2020-03-14 DIAGNOSIS — D6489 Other specified anemias: Secondary | ICD-10-CM | POA: Diagnosis not present

## 2020-03-14 DIAGNOSIS — N178 Other acute kidney failure: Secondary | ICD-10-CM | POA: Diagnosis not present

## 2020-03-14 DIAGNOSIS — I272 Pulmonary hypertension, unspecified: Secondary | ICD-10-CM | POA: Diagnosis not present

## 2020-03-14 DIAGNOSIS — N1831 Chronic kidney disease, stage 3a: Secondary | ICD-10-CM | POA: Diagnosis not present

## 2020-03-14 DIAGNOSIS — J9611 Chronic respiratory failure with hypoxia: Secondary | ICD-10-CM | POA: Diagnosis not present

## 2020-03-14 DIAGNOSIS — I313 Pericardial effusion (noninflammatory): Secondary | ICD-10-CM | POA: Diagnosis not present

## 2020-03-14 DIAGNOSIS — J188 Other pneumonia, unspecified organism: Secondary | ICD-10-CM | POA: Diagnosis not present

## 2020-03-14 DIAGNOSIS — I480 Paroxysmal atrial fibrillation: Secondary | ICD-10-CM | POA: Diagnosis not present

## 2020-03-14 DIAGNOSIS — J984 Other disorders of lung: Secondary | ICD-10-CM | POA: Diagnosis not present

## 2020-03-16 ENCOUNTER — Telehealth: Payer: Self-pay

## 2020-03-16 NOTE — Telephone Encounter (Signed)
Received fax ROI from [facility] requesting immunization records for pt to be faxed to [phone].  Printed and faxed vaccine record to number listed. Placed ROI in scan file.

## 2020-03-19 DIAGNOSIS — I5032 Chronic diastolic (congestive) heart failure: Secondary | ICD-10-CM | POA: Diagnosis not present

## 2020-03-19 DIAGNOSIS — R5381 Other malaise: Secondary | ICD-10-CM | POA: Diagnosis not present

## 2020-03-19 DIAGNOSIS — I480 Paroxysmal atrial fibrillation: Secondary | ICD-10-CM | POA: Diagnosis not present

## 2020-03-19 DIAGNOSIS — J984 Other disorders of lung: Secondary | ICD-10-CM | POA: Diagnosis not present

## 2020-03-19 DIAGNOSIS — I313 Pericardial effusion (noninflammatory): Secondary | ICD-10-CM | POA: Diagnosis not present

## 2020-03-19 DIAGNOSIS — N1831 Chronic kidney disease, stage 3a: Secondary | ICD-10-CM | POA: Diagnosis not present

## 2020-03-23 ENCOUNTER — Other Ambulatory Visit: Payer: Self-pay

## 2020-03-23 ENCOUNTER — Ambulatory Visit (INDEPENDENT_AMBULATORY_CARE_PROVIDER_SITE_OTHER): Payer: Medicare HMO | Admitting: Family Medicine

## 2020-03-23 VITALS — BP 100/48 | HR 98 | Temp 98.0°F | Wt 123.2 lb

## 2020-03-23 DIAGNOSIS — I313 Pericardial effusion (noninflammatory): Secondary | ICD-10-CM

## 2020-03-23 DIAGNOSIS — I3139 Other pericardial effusion (noninflammatory): Secondary | ICD-10-CM

## 2020-03-24 ENCOUNTER — Other Ambulatory Visit: Payer: Self-pay | Admitting: Family Medicine

## 2020-03-24 ENCOUNTER — Telehealth: Payer: Self-pay | Admitting: Family Medicine

## 2020-03-24 DIAGNOSIS — E876 Hypokalemia: Secondary | ICD-10-CM

## 2020-03-24 LAB — CBC WITH DIFFERENTIAL/PLATELET
Absolute Monocytes: 680 cells/uL (ref 200–950)
Basophils Absolute: 34 cells/uL (ref 0–200)
Basophils Relative: 0.4 %
Eosinophils Absolute: 42 cells/uL (ref 15–500)
Eosinophils Relative: 0.5 %
HCT: 32.8 % — ABNORMAL LOW (ref 35.0–45.0)
Hemoglobin: 10.7 g/dL — ABNORMAL LOW (ref 11.7–15.5)
Lymphs Abs: 412 cells/uL — ABNORMAL LOW (ref 850–3900)
MCH: 31.5 pg (ref 27.0–33.0)
MCHC: 32.6 g/dL (ref 32.0–36.0)
MCV: 96.5 fL (ref 80.0–100.0)
MPV: 10.8 fL (ref 7.5–12.5)
Monocytes Relative: 8.1 %
Neutro Abs: 7232 cells/uL (ref 1500–7800)
Neutrophils Relative %: 86.1 %
Platelets: 216 10*3/uL (ref 140–400)
RBC: 3.4 10*6/uL — ABNORMAL LOW (ref 3.80–5.10)
RDW: 17.1 % — ABNORMAL HIGH (ref 11.0–15.0)
Total Lymphocyte: 4.9 %
WBC: 8.4 10*3/uL (ref 3.8–10.8)

## 2020-03-24 LAB — BASIC METABOLIC PANEL
BUN/Creatinine Ratio: 13 (calc) (ref 6–22)
BUN: 17 mg/dL (ref 7–25)
CO2: 38 mmol/L — ABNORMAL HIGH (ref 20–32)
Calcium: 8.5 mg/dL — ABNORMAL LOW (ref 8.6–10.4)
Chloride: 91 mmol/L — ABNORMAL LOW (ref 98–110)
Creat: 1.27 mg/dL — ABNORMAL HIGH (ref 0.60–0.93)
Glucose, Bld: 108 mg/dL — ABNORMAL HIGH (ref 65–99)
Potassium: 2.6 mmol/L — CL (ref 3.5–5.3)
Sodium: 143 mmol/L (ref 135–146)

## 2020-03-24 MED ORDER — POTASSIUM CHLORIDE CRYS ER 20 MEQ PO TBCR
EXTENDED_RELEASE_TABLET | ORAL | 0 refills | Status: DC
Start: 2020-03-24 — End: 2020-03-27

## 2020-03-24 NOTE — Telephone Encounter (Signed)
Critical lab value received this AM. Potassium of 2.6. patient was called and she did not answer phone. Called husband who said patient has been fine with no symptoms. Asked if they could come into clinic this AM and he states it is too far for them. Im going to send in potassium repletion for 76mq today and then 229m daily x 2 days with f/u with PCP on Monday with labs.  Strict ER precautions given for any chest pain, shortness of breath, palpitations.   melitta-please schedule with PCP on Monday and let patient know of her appointment. (call husband cell).   Thanks,  Dr. WoRogers Blocker

## 2020-03-24 NOTE — Telephone Encounter (Signed)
Pt was called and given message.  Husband voiced understanding. Appointment scheduled and confirmed with Pt's husband.

## 2020-03-24 NOTE — Progress Notes (Signed)
Called patient and spoke with her. States she feels fine with no symptoms. Had already taken her 12mq of potassium. Denies any nausea/vomiting, diarrhea, shortness of breath from baseline, chest pain or palpitations. Will get labs done Monday morning and has f/u with pcp on Tuesday. Er precautions given.

## 2020-03-26 ENCOUNTER — Other Ambulatory Visit: Payer: Self-pay

## 2020-03-26 ENCOUNTER — Other Ambulatory Visit (INDEPENDENT_AMBULATORY_CARE_PROVIDER_SITE_OTHER): Payer: Medicare HMO

## 2020-03-26 DIAGNOSIS — J9612 Chronic respiratory failure with hypercapnia: Secondary | ICD-10-CM | POA: Diagnosis not present

## 2020-03-26 DIAGNOSIS — I480 Paroxysmal atrial fibrillation: Secondary | ICD-10-CM | POA: Diagnosis not present

## 2020-03-26 DIAGNOSIS — J9611 Chronic respiratory failure with hypoxia: Secondary | ICD-10-CM | POA: Diagnosis not present

## 2020-03-26 DIAGNOSIS — I130 Hypertensive heart and chronic kidney disease with heart failure and stage 1 through stage 4 chronic kidney disease, or unspecified chronic kidney disease: Secondary | ICD-10-CM | POA: Diagnosis not present

## 2020-03-26 DIAGNOSIS — I10 Essential (primary) hypertension: Secondary | ICD-10-CM | POA: Diagnosis not present

## 2020-03-26 DIAGNOSIS — J439 Emphysema, unspecified: Secondary | ICD-10-CM | POA: Diagnosis not present

## 2020-03-26 DIAGNOSIS — I272 Pulmonary hypertension, unspecified: Secondary | ICD-10-CM | POA: Diagnosis not present

## 2020-03-26 DIAGNOSIS — D631 Anemia in chronic kidney disease: Secondary | ICD-10-CM | POA: Diagnosis not present

## 2020-03-26 DIAGNOSIS — I50810 Right heart failure, unspecified: Secondary | ICD-10-CM | POA: Diagnosis not present

## 2020-03-26 DIAGNOSIS — E876 Hypokalemia: Secondary | ICD-10-CM

## 2020-03-26 DIAGNOSIS — I5032 Chronic diastolic (congestive) heart failure: Secondary | ICD-10-CM | POA: Diagnosis not present

## 2020-03-26 DIAGNOSIS — N1831 Chronic kidney disease, stage 3a: Secondary | ICD-10-CM | POA: Diagnosis not present

## 2020-03-26 LAB — BASIC METABOLIC PANEL
BUN: 18 mg/dL (ref 6–23)
CO2: 39 mEq/L — ABNORMAL HIGH (ref 19–32)
Calcium: 8.5 mg/dL (ref 8.4–10.5)
Chloride: 94 mEq/L — ABNORMAL LOW (ref 96–112)
Creatinine, Ser: 1.24 mg/dL — ABNORMAL HIGH (ref 0.40–1.20)
GFR: 42.53 mL/min — ABNORMAL LOW (ref >60.00)
Glucose, Bld: 109 mg/dL — ABNORMAL HIGH (ref 70–99)
Potassium: 3.2 mEq/L — ABNORMAL LOW (ref 3.5–5.1)
Sodium: 141 mEq/L (ref 135–145)

## 2020-03-26 LAB — LIPID PANEL
Cholesterol: 165 mg/dL (ref 0–200)
HDL: 59.8 mg/dL (ref >39.00)
LDL Cholesterol: 81 mg/dL (ref 0–99)
NonHDL: 104.98
Total CHOL/HDL Ratio: 3
Triglycerides: 119 mg/dL (ref 0.0–149.0)
VLDL: 23.8 mg/dL (ref 0.0–40.0)

## 2020-03-26 NOTE — Progress Notes (Signed)
No critical labs need to be addressed urgently. We will discuss labs in detail at upcoming office visit.   

## 2020-03-26 NOTE — Telephone Encounter (Signed)
Noted.

## 2020-03-26 NOTE — Telephone Encounter (Signed)
[facility] - Client TELEPHONE ADVICE RECORD [facility]urse Patient Name: Linda Stevens Gender: Female DOB: [birth year] Age: 74 Y 10 M 25 D Return Phone Number: Address: City/State/Zip:  Client Town[place] Client Site [place] Physician Eliezer Lofts - MD Contact Type Call Who Is Calling Lab Lab Name [facility] Phone Number [phone] Lab Tech Name Barnett Applebaum Lab Reference Number [place] [id number] P Chief Complaint Lab Result (Critical or Stat) Call Type Lab Send to RN Reason for Call Report lab results Initial Comment Caller states has a critical lab result. Translation No Nurse Assessment Nurse: May, RN, Tammy Date/Time [name] Time): 03/24/2020 6:54:07 AM Is there an on-call provider listed? ---Yes Please list name of person reporting value (Lab Employee) and a contact number. ---[facility] [phone] [place] Please document the following items: Lab name Lab value (read back to lab to verify) Reference range for lab value Date and time blood was drawn Collect time of birth for bilirubin results ---K+ 2.6 collected 03/23/2020 at 324pm Please collect the patient contact information from the lab. (name, phone number and address) ---[name] [birth year] [phone] List any special notes provided by lab. ---verified by repeat analysis Disp. Time [name] Time) Disposition Final User 03/24/2020 7:01:16 AM Rosalita Chessman Provider May, RN, Tammy 03/24/2020 7:01:52 AM Lab Call May, RN, Tammy Reason: see lab assess 03/24/2020 7:01:58 AM Clinical Call Yes May, RN, [place] Paging DoctorName Phone DateTime Result/Outcome Message Type Notes Orma Flaming- MD [phone] 03/24/2020 7:01:16 AM Called On Call Provider - Reached Doctor PagedPLEASE NOTE: All timestamps contained within this report are represented as [place] Standard Time. CONFIDENTIALTY NOTICE: This fax transmission is intended  only for the addressee. It contains information that is legally privileged, confidential or otherwise protected from use or disclosure. If you are not the intended recipient, you are strictly prohibited from reviewing, disclosing, copying using or disseminating any of this information or taking any action in reliance on or regarding this information. If you have received this fax in error, please notify us immediately by telephone so that we can arrange for its return to [place]. Phone: [phone], Toll-Free: [phone], Fax: [phone] Page: 2 of 2 Call Id: [id number] Paging DoctorName Phone DateTime Result/Outcome Message Type Notes Orma Flaming- MD 03/24/2020 7:01:32 AM Spoke with On Call - General Message Result will call pt regarding results

## 2020-03-27 ENCOUNTER — Ambulatory Visit (INDEPENDENT_AMBULATORY_CARE_PROVIDER_SITE_OTHER): Payer: Medicare HMO | Admitting: Family Medicine

## 2020-03-27 ENCOUNTER — Encounter: Payer: Self-pay | Admitting: Family Medicine

## 2020-03-27 VITALS — BP 100/50 | HR 94 | Temp 98.0°F | Ht 63.5 in | Wt 125.5 lb

## 2020-03-27 DIAGNOSIS — E876 Hypokalemia: Secondary | ICD-10-CM

## 2020-03-27 MED ORDER — POTASSIUM CHLORIDE CRYS ER 20 MEQ PO TBCR: 20.0000 meq | EXTENDED_RELEASE_TABLET | Freq: Every day | ORAL | 1 refills | Status: DC

## 2020-03-27 NOTE — Progress Notes (Signed)
Chief Complaint  Patient presents with  . Hypokalemia    History of Present Illness: HPI   Potassium found to be low at 2.6.. given temporary course of potassium by oncall MD.  recheck.   On 7/19/ K was 3.2  She has been drinking orange uice.   She is taking lasix  2 times daily.[name] no change in dose.  She is on ARB. She has not been eating well but it is improving now.    This visit occurred during the SARS-CoV-2 public health emergency.  Safety protocols were in place, including screening questions prior to the visit, additional usage of staff PPE, and extensive cleaning of exam room while observing appropriate contact time as indicated for disinfecting solutions.   COVID 19 screen:  No recent travel or known exposure to COVID19 The patient denies respiratory symptoms of COVID 19 at this time. The importance of social distancing was discussed today.     Review of Systems  Constitutional: Negative for chills and fever.  HENT: Negative for congestion and ear pain.   Eyes: Negative for pain and redness.  Respiratory: Negative for cough and shortness of breath.   Cardiovascular: Negative for chest pain, palpitations and leg swelling.  Gastrointestinal: Negative for abdominal pain, blood in stool, constipation, diarrhea, nausea and vomiting.  Genitourinary: Negative for dysuria.  Musculoskeletal: Negative for falls and myalgias.  Skin: Negative for rash.  Neurological: Negative for dizziness.  Psychiatric/Behavioral: Negative for depression. The patient is not nervous/anxious.       Past Medical History:  Diagnosis Date  . Acute respiratory failure with hypoxia and hypercapnea 02/26/2014  . Allergic rhinitis due to pollen   . Allergy   . Arthritis   . Asthma   . CHF (congestive heart failure) ([place])   . COPD (chronic obstructive pulmonary disease) ([place])   . Diastolic dysfunction    a. 02/2014 Echo: EF 65-70%, Gr 1 DD, RVH;  b. 09/2015 Echo: EF 55%, Gr 1 DD.  [name] Emphysema  of lung ([place])   . H/O seasonal allergies   . History of chicken pox   . History of tobacco abuse    a. Quit 2015.  [name] History of UTI   . Hypertension   . Hypertensive heart disease   . Lower extremity edema   . Pericardial effusion    a. 09/2015 Echo: Mod eff w/o tamponade.  . Right heart failure with reduced right ventricular function ([place])    a. 09/2015 Echo: EF 55%, Gr 1 DD, D shaped IV septum, sev dil RV with mod reduced fxn, mod dil RA, RV-RA grad 14mHg, PASP 879mg, mod pericard eff w/o tamponade.  . Severe Pulmonary Hypertension    a. 09/2015 Echo: PASP 8790m.    reports that she quit smoking about 6 years ago. Her smoking use included cigarettes. She has a 40.00 pack-year smoking history. She has never used smokeless tobacco. She reports that she does not drink alcohol and does not use drugs.   Current Outpatient Medications:  .  acetaminophen (TYLENOL) 325 MG tablet, Take 325 mg by mouth at bedtime as needed for moderate pain or headache. , Disp: , Rfl:  .  albuterol (PROVENTIL HFA;VENTOLIN HFA) 108 (90 Base) MCG/ACT inhaler, Inhale 2 puffs every 6 (six) hours as needed into the lungs for wheezing or shortness of breath., Disp: 1 Inhaler, Rfl: 11 .  ambrisentan (LETAIRIS) 10 MG tablet, TAKE 1 TABLET (10MG) BY MOUTH DAILY. DO NOT HANDLE IF PREGNANT. DO NOTSPLIT, CRUSH, OR CHEW.  AVOID INHALATION AND CONTACT WITH SKIN OR EYES . CALL [phone] TO REFILL. (Patient taking differently: Take 10 mg by mouth daily. [name] CALL [phone] TO REFILL.), Disp: 90 tablet, Rfl: 3 .  amiodarone (PACERONE) 200 MG tablet, Take 1 tablet (200 mg total) by mouth daily., Disp: 30 tablet, Rfl: 1 .  apixaban (ELIQUIS) 5 MG TABS tablet, Take 1 tablet (5 mg total) by mouth 2 (two) times daily., Disp: 60 tablet, Rfl: 1 .  aspirin EC 81 MG EC tablet, Take 1 tablet (81 mg total) by mouth daily., Disp: 30 tablet, Rfl: 0 .  colchicine 0.6 MG tablet, Take 0.5 tablets (0.3 mg total) by mouth 2 (two) times daily.,  Disp: 60 tablet, Rfl: 1 .  fluticasone (FLONASE) 50 MCG/ACT nasal spray, USE 2 SPRAYS IN EAXH NOSTRIL ONCE A DAY (Patient taking differently: Place 2 sprays into both nostrils daily. ), Disp: 48 mL, Rfl: 5 .  furosemide (LASIX) 40 MG tablet, Take 1 tablet (40 mg total) by mouth daily., Disp: 30 tablet, Rfl: 1 .  guaiFENesin (MUCINEX) 600 MG 12 hr tablet, Take 1 tablet (600 mg total) by mouth 2 (two) times daily., Disp: 60 tablet, Rfl: 0 .  hydrOXYzine (ATARAX/VISTARIL) 50 MG tablet, Take 25-50 mg by mouth at bedtime as needed., Disp: , Rfl:  .  ipratropium (ATROVENT) 0.06 % nasal spray, PLACE 2 SPRAYS INTO BOTH NOSTRILS 2 (TWO) TIMES DAILY, Disp: 15 mL, Rfl: 3 .  midodrine (PROAMATINE) 2.5 MG tablet, Take 1 tablet (2.5 mg total) by mouth 3 (three) times daily as needed (SBP<100)., Disp: 30 tablet, Rfl: 0 .  OXYGEN, 2 to 2lpm 24/7, Disp: , Rfl:  .  pantoprazole (PROTONIX) 40 MG tablet, Take 1 tablet (40 mg total) by mouth daily at 12 noon., Disp: 30 tablet, Rfl: 0 .  Polyethyl Glycol-Propyl Glycol (SYSTANE OP), Apply 1-2 drops to eye daily as needed (dry eyes)., Disp: , Rfl:  .  potassium chloride SA (KLOR-CON) 20 MEQ tablet, Take 40 meq (2 tablets) x1 day and then one tablet for 2 days., Disp: 4 tablet, Rfl: 0 .  predniSONE (DELTASONE) 5 MG tablet, Take 1 tablet (5 mg total) by mouth daily with breakfast., Disp: 5 tablet, Rfl: 0 .  tadalafil, PAH, (ADCIRCA) 20 MG tablet, Take 2 tablets (40 mg total) by mouth daily., Disp: 180 tablet, Rfl: 3 .  TRELEGY ELLIPTA 100-62.5-25 MCG/INH AEPB, INHALE 2 PUFFS INTO THE LUNGS EVERY DAY (Patient taking differently: Inhale 2 puffs into the lungs daily. ), Disp: 60 each, Rfl: 5   Observations/Objective: Blood pressure (!) 100/50, pulse 94, temperature 98 F (36.7 C), temperature source Temporal, height 5' 3.5" (1.613 m), weight 125 lb 8 oz (56.9 kg), SpO2 94 %.  Physical Exam Constitutional:      General: She is not in acute distress.    Appearance: Normal  appearance. She is well-developed. She is not ill-appearing or toxic-appearing.     Comments: On oxygen in wheelchair  HENT:     Head: Normocephalic.     Right Ear: Hearing, tympanic membrane, ear canal and external ear normal. Tympanic membrane is not erythematous, retracted or bulging.     Left Ear: Hearing, tympanic membrane, ear canal and external ear normal. Tympanic membrane is not erythematous, retracted or bulging.     Nose: No mucosal edema or rhinorrhea.     Right Sinus: No maxillary sinus tenderness or frontal sinus tenderness.     Left Sinus: No maxillary sinus tenderness or frontal sinus tenderness.  Mouth/Throat:     Pharynx: Uvula midline.  Eyes:     General: Lids are normal. Lids are everted, no foreign bodies appreciated.     Conjunctiva/sclera: Conjunctivae normal.     Pupils: Pupils are equal, round, and reactive to light.  Neck:     Thyroid: No thyroid mass or thyromegaly.     Vascular: No carotid bruit.     Trachea: Trachea normal.  Cardiovascular:     Rate and Rhythm: Normal rate and regular rhythm.     Pulses: Normal pulses.     Heart sounds: Normal heart sounds, S1 normal and S2 normal. No murmur heard.  No friction rub. No gallop.   Pulmonary:     Effort: Pulmonary effort is normal. No tachypnea or respiratory distress.     Breath sounds: Normal breath sounds. No decreased breath sounds, wheezing, rhonchi or rales.  Abdominal:     General: Bowel sounds are normal.     Palpations: Abdomen is soft.     Tenderness: There is no abdominal tenderness.  Musculoskeletal:     Cervical back: Normal range of motion and neck supple.  Skin:    General: Skin is warm and dry.     Findings: No rash.  Neurological:     Mental Status: She is alert.  Psychiatric:        Mood and Affect: Mood is not anxious or depressed.        Speech: Speech normal.        Behavior: Behavior normal. Behavior is cooperative.        Thought Content: Thought content normal.         Judgment: Judgment normal.      Assessment and Plan Hypokalemia Likely due to diuretic treatment ( lasix BID)  She will continue 20 mEq of potassium and return for recheck K in 2 weeks.         Eliezer Lofts, MD

## 2020-03-27 NOTE — Assessment & Plan Note (Signed)
Likely due to diuretic treatment ( lasix BID)  She will continue 20 mEq of potassium and return for recheck K in 2 weeks.

## 2020-03-27 NOTE — Patient Instructions (Signed)
Schedule potassium recheck in 2 weeks.  Stay  on 20 MEq of potassium while on lasix.

## 2020-03-28 DIAGNOSIS — D631 Anemia in chronic kidney disease: Secondary | ICD-10-CM | POA: Diagnosis not present

## 2020-03-28 DIAGNOSIS — J439 Emphysema, unspecified: Secondary | ICD-10-CM | POA: Diagnosis not present

## 2020-03-28 DIAGNOSIS — J9612 Chronic respiratory failure with hypercapnia: Secondary | ICD-10-CM | POA: Diagnosis not present

## 2020-03-28 DIAGNOSIS — I272 Pulmonary hypertension, unspecified: Secondary | ICD-10-CM | POA: Diagnosis not present

## 2020-03-28 DIAGNOSIS — I480 Paroxysmal atrial fibrillation: Secondary | ICD-10-CM | POA: Diagnosis not present

## 2020-03-28 DIAGNOSIS — N1831 Chronic kidney disease, stage 3a: Secondary | ICD-10-CM | POA: Diagnosis not present

## 2020-03-28 DIAGNOSIS — I130 Hypertensive heart and chronic kidney disease with heart failure and stage 1 through stage 4 chronic kidney disease, or unspecified chronic kidney disease: Secondary | ICD-10-CM | POA: Diagnosis not present

## 2020-03-28 DIAGNOSIS — I50810 Right heart failure, unspecified: Secondary | ICD-10-CM | POA: Diagnosis not present

## 2020-03-28 DIAGNOSIS — J9611 Chronic respiratory failure with hypoxia: Secondary | ICD-10-CM | POA: Diagnosis not present

## 2020-03-28 DIAGNOSIS — I5032 Chronic diastolic (congestive) heart failure: Secondary | ICD-10-CM | POA: Diagnosis not present

## 2020-03-29 DIAGNOSIS — J439 Emphysema, unspecified: Secondary | ICD-10-CM | POA: Diagnosis not present

## 2020-03-29 DIAGNOSIS — J9621 Acute and chronic respiratory failure with hypoxia: Secondary | ICD-10-CM

## 2020-03-29 DIAGNOSIS — J9612 Chronic respiratory failure with hypercapnia: Secondary | ICD-10-CM | POA: Diagnosis not present

## 2020-03-29 DIAGNOSIS — Z9981 Dependence on supplemental oxygen: Secondary | ICD-10-CM

## 2020-03-29 DIAGNOSIS — Z7952 Long term (current) use of systemic steroids: Secondary | ICD-10-CM

## 2020-03-29 DIAGNOSIS — M1990 Unspecified osteoarthritis, unspecified site: Secondary | ICD-10-CM

## 2020-03-29 DIAGNOSIS — J168 Pneumonia due to other specified infectious organisms: Secondary | ICD-10-CM

## 2020-03-29 DIAGNOSIS — I272 Pulmonary hypertension, unspecified: Secondary | ICD-10-CM | POA: Diagnosis not present

## 2020-03-29 DIAGNOSIS — D631 Anemia in chronic kidney disease: Secondary | ICD-10-CM | POA: Diagnosis not present

## 2020-03-29 DIAGNOSIS — E785 Hyperlipidemia, unspecified: Secondary | ICD-10-CM

## 2020-03-29 DIAGNOSIS — K7460 Unspecified cirrhosis of liver: Secondary | ICD-10-CM

## 2020-03-29 DIAGNOSIS — N1831 Chronic kidney disease, stage 3a: Secondary | ICD-10-CM | POA: Diagnosis not present

## 2020-03-29 DIAGNOSIS — Z7951 Long term (current) use of inhaled steroids: Secondary | ICD-10-CM

## 2020-03-29 DIAGNOSIS — I130 Hypertensive heart and chronic kidney disease with heart failure and stage 1 through stage 4 chronic kidney disease, or unspecified chronic kidney disease: Secondary | ICD-10-CM | POA: Diagnosis not present

## 2020-03-29 DIAGNOSIS — I50810 Right heart failure, unspecified: Secondary | ICD-10-CM | POA: Diagnosis not present

## 2020-03-29 DIAGNOSIS — Z7901 Long term (current) use of anticoagulants: Secondary | ICD-10-CM

## 2020-03-29 DIAGNOSIS — Z87891 Personal history of nicotine dependence: Secondary | ICD-10-CM

## 2020-03-29 DIAGNOSIS — I5032 Chronic diastolic (congestive) heart failure: Secondary | ICD-10-CM | POA: Diagnosis not present

## 2020-03-29 DIAGNOSIS — J301 Allergic rhinitis due to pollen: Secondary | ICD-10-CM

## 2020-03-29 DIAGNOSIS — I480 Paroxysmal atrial fibrillation: Secondary | ICD-10-CM | POA: Diagnosis not present

## 2020-03-29 DIAGNOSIS — J9611 Chronic respiratory failure with hypoxia: Secondary | ICD-10-CM | POA: Diagnosis not present

## 2020-03-29 DIAGNOSIS — Z8701 Personal history of pneumonia (recurrent): Secondary | ICD-10-CM

## 2020-03-29 DIAGNOSIS — Z7982 Long term (current) use of aspirin: Secondary | ICD-10-CM

## 2020-03-30 DIAGNOSIS — I272 Pulmonary hypertension, unspecified: Secondary | ICD-10-CM | POA: Diagnosis not present

## 2020-03-30 DIAGNOSIS — J9611 Chronic respiratory failure with hypoxia: Secondary | ICD-10-CM | POA: Diagnosis not present

## 2020-03-30 DIAGNOSIS — I480 Paroxysmal atrial fibrillation: Secondary | ICD-10-CM | POA: Diagnosis not present

## 2020-03-30 DIAGNOSIS — D631 Anemia in chronic kidney disease: Secondary | ICD-10-CM | POA: Diagnosis not present

## 2020-03-30 DIAGNOSIS — N1831 Chronic kidney disease, stage 3a: Secondary | ICD-10-CM | POA: Diagnosis not present

## 2020-03-30 DIAGNOSIS — I50810 Right heart failure, unspecified: Secondary | ICD-10-CM | POA: Diagnosis not present

## 2020-03-30 DIAGNOSIS — I5032 Chronic diastolic (congestive) heart failure: Secondary | ICD-10-CM | POA: Diagnosis not present

## 2020-03-30 DIAGNOSIS — J439 Emphysema, unspecified: Secondary | ICD-10-CM | POA: Diagnosis not present

## 2020-03-30 DIAGNOSIS — I130 Hypertensive heart and chronic kidney disease with heart failure and stage 1 through stage 4 chronic kidney disease, or unspecified chronic kidney disease: Secondary | ICD-10-CM | POA: Diagnosis not present

## 2020-03-30 DIAGNOSIS — J9612 Chronic respiratory failure with hypercapnia: Secondary | ICD-10-CM | POA: Diagnosis not present

## 2020-04-03 DIAGNOSIS — I272 Pulmonary hypertension, unspecified: Secondary | ICD-10-CM | POA: Diagnosis not present

## 2020-04-03 DIAGNOSIS — I480 Paroxysmal atrial fibrillation: Secondary | ICD-10-CM | POA: Diagnosis not present

## 2020-04-03 DIAGNOSIS — J9612 Chronic respiratory failure with hypercapnia: Secondary | ICD-10-CM | POA: Diagnosis not present

## 2020-04-03 DIAGNOSIS — I130 Hypertensive heart and chronic kidney disease with heart failure and stage 1 through stage 4 chronic kidney disease, or unspecified chronic kidney disease: Secondary | ICD-10-CM | POA: Diagnosis not present

## 2020-04-03 DIAGNOSIS — I50810 Right heart failure, unspecified: Secondary | ICD-10-CM | POA: Diagnosis not present

## 2020-04-03 DIAGNOSIS — D631 Anemia in chronic kidney disease: Secondary | ICD-10-CM | POA: Diagnosis not present

## 2020-04-03 DIAGNOSIS — N1831 Chronic kidney disease, stage 3a: Secondary | ICD-10-CM | POA: Diagnosis not present

## 2020-04-03 DIAGNOSIS — J439 Emphysema, unspecified: Secondary | ICD-10-CM | POA: Diagnosis not present

## 2020-04-03 DIAGNOSIS — I5032 Chronic diastolic (congestive) heart failure: Secondary | ICD-10-CM | POA: Diagnosis not present

## 2020-04-03 DIAGNOSIS — J9611 Chronic respiratory failure with hypoxia: Secondary | ICD-10-CM | POA: Diagnosis not present

## 2020-04-05 ENCOUNTER — Other Ambulatory Visit: Payer: Self-pay

## 2020-04-05 ENCOUNTER — Ambulatory Visit (HOSPITAL_COMMUNITY)
Admission: RE | Admit: 2020-04-05 | Discharge: 2020-04-05 | Disposition: A | Discharge status: Home / Self Care | Payer: Medicare HMO | Source: Ambulatory Visit | Attending: Internal Medicine | Admitting: Internal Medicine

## 2020-04-05 VITALS — BP 103/65 | HR 87 | Wt 125.8 lb

## 2020-04-05 DIAGNOSIS — Z79899 Other long term (current) drug therapy: Secondary | ICD-10-CM | POA: Diagnosis not present

## 2020-04-05 DIAGNOSIS — D631 Anemia in chronic kidney disease: Secondary | ICD-10-CM | POA: Diagnosis not present

## 2020-04-05 DIAGNOSIS — J439 Emphysema, unspecified: Secondary | ICD-10-CM | POA: Insufficient documentation

## 2020-04-05 DIAGNOSIS — K7460 Unspecified cirrhosis of liver: Secondary | ICD-10-CM | POA: Insufficient documentation

## 2020-04-05 DIAGNOSIS — J9612 Chronic respiratory failure with hypercapnia: Secondary | ICD-10-CM | POA: Diagnosis not present

## 2020-04-05 DIAGNOSIS — Z87891 Personal history of nicotine dependence: Secondary | ICD-10-CM | POA: Diagnosis not present

## 2020-04-05 DIAGNOSIS — I2721 Secondary pulmonary arterial hypertension: Secondary | ICD-10-CM | POA: Insufficient documentation

## 2020-04-05 DIAGNOSIS — J302 Other seasonal allergic rhinitis: Secondary | ICD-10-CM | POA: Diagnosis not present

## 2020-04-05 DIAGNOSIS — J90 Pleural effusion, not elsewhere classified: Secondary | ICD-10-CM | POA: Insufficient documentation

## 2020-04-05 DIAGNOSIS — I5030 Unspecified diastolic (congestive) heart failure: Secondary | ICD-10-CM | POA: Insufficient documentation

## 2020-04-05 DIAGNOSIS — Z7982 Long term (current) use of aspirin: Secondary | ICD-10-CM | POA: Insufficient documentation

## 2020-04-05 DIAGNOSIS — Z7901 Long term (current) use of anticoagulants: Secondary | ICD-10-CM | POA: Insufficient documentation

## 2020-04-05 DIAGNOSIS — Z87440 Personal history of urinary (tract) infections: Secondary | ICD-10-CM | POA: Insufficient documentation

## 2020-04-05 DIAGNOSIS — I110 Hypertensive heart disease with heart failure: Secondary | ICD-10-CM | POA: Diagnosis not present

## 2020-04-05 DIAGNOSIS — Z8249 Family history of ischemic heart disease and other diseases of the circulatory system: Secondary | ICD-10-CM | POA: Insufficient documentation

## 2020-04-05 DIAGNOSIS — J9610 Chronic respiratory failure, unspecified whether with hypoxia or hypercapnia: Secondary | ICD-10-CM | POA: Insufficient documentation

## 2020-04-05 DIAGNOSIS — J9611 Chronic respiratory failure with hypoxia: Secondary | ICD-10-CM | POA: Diagnosis not present

## 2020-04-05 DIAGNOSIS — I5032 Chronic diastolic (congestive) heart failure: Secondary | ICD-10-CM | POA: Diagnosis not present

## 2020-04-05 DIAGNOSIS — I480 Paroxysmal atrial fibrillation: Secondary | ICD-10-CM | POA: Diagnosis not present

## 2020-04-05 DIAGNOSIS — I313 Pericardial effusion (noninflammatory): Secondary | ICD-10-CM | POA: Insufficient documentation

## 2020-04-05 DIAGNOSIS — I50810 Right heart failure, unspecified: Secondary | ICD-10-CM | POA: Diagnosis not present

## 2020-04-05 DIAGNOSIS — I272 Pulmonary hypertension, unspecified: Secondary | ICD-10-CM

## 2020-04-05 DIAGNOSIS — I2510 Atherosclerotic heart disease of native coronary artery without angina pectoris: Secondary | ICD-10-CM | POA: Insufficient documentation

## 2020-04-05 DIAGNOSIS — N1831 Chronic kidney disease, stage 3a: Secondary | ICD-10-CM | POA: Diagnosis not present

## 2020-04-05 DIAGNOSIS — I3139 Other pericardial effusion (noninflammatory): Secondary | ICD-10-CM

## 2020-04-05 DIAGNOSIS — I130 Hypertensive heart and chronic kidney disease with heart failure and stage 1 through stage 4 chronic kidney disease, or unspecified chronic kidney disease: Secondary | ICD-10-CM | POA: Diagnosis not present

## 2020-04-05 MED ORDER — AMIODARONE HCL 100 MG PO TABS: 100.0000 mg | ORAL_TABLET | Freq: Every day | ORAL | 6 refills | Status: DC

## 2020-04-05 NOTE — Progress Notes (Signed)
mnnPatient ID: Linda Stevens, female   DOB: [birth year], 74 y.o.   MRN: [id number]  [facility] NOTE  Patient ID: Linda Stevens, female   DOB: 1946-06-20, 74 y.o.   MRN: [id number] Primary Cardiologist: Dr Linda Stevens  Pulmonary: Dr Linda Stevens PCP: Linda Stevens   HPI:  [name] is  74 y/o woman with a h/o HTN, COPD (former smoker quit 2015), seasonal allergies, asthma, pulmonary HTN, and diastolic dysfxn.  Admitted January 2017 secondary to progressive DOE, hypoxia, and lower ext edema and found to have severe PAH. R/LHC 10/08/15 with normal coronaries, normal CO, and severe PAH PAP 104/49 with 13.6 WU. PAH well out of proportion to left-sided filling pressures and COPD. Started revatio 20 TID with consideration for macitentan as outpatient. PFTs 10/05/15  FEV1 0.91 (40%)FVC 1.64 (55%), DLCO 8.80 (38%). Auto-immune and infectious serologies negative. VQ negative. Discharge weight 148 pounds.   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 Has been drained twice. Underwent first thoracentesis on 11/7.  Transudative. CT scan 07/24/17 showed large R effusion and small pericardial effusion. +COPD.  Underwent repeat thoracentesis on 07/29/17 with 310cc out.  Cytology negative. F/u CXR 11/21 showed small bilateral effusions.  RF markedly ++ but other serology negative. Saw Dr. Amil Stevens who said she did not have RA but was diagnosed with polyarthralgia/OA.   Admitted 02/19/20-03/10/20 with respiratory failure. Echo EF 55-60% with mild RV HK with large peridardial effusion. Suspicion for auto-immune flare. ESR 63. Auto-immune panel again negative. Treated with steroids with prompt improvement and regression of pericardial effusion. Had PAF Went to SNF. Now doing rehab at home  Wearing 4L Potassium has been low and following with Dr. Diona Stevens Has labs tomorrow.  Here with her family for post-hospital f/u. Feeling much better.Able to do ADLs without  problem. No edema, orthopnea or PND. Weight stable. Complaint with meds. No palpitations. No bleeding with apixaban.    Echo 4/21 LVEF 70-75% RV normal . No effusion. Personally reviewed   Echo 4/19 EF 65% RV normal. No effusion Personally reviewed Echo 5/18 EF 60-65% RV completely normal. No TR to measure RVSP. No effusion  Echo 3/17 EF 60-65% septum still flattened. RV size improved moderate HK. RVSP ~80. IVC small. Pericardial effusion essentially resolved   CXR 09/28/17 Small bilateral effusions  PAH regimen: 1. Adcirca 40 2. Letairis 10 (Switched to [name] as it was thought that sinusitis might be due to macitentan.)  6MW 8/17; 840 ft (256 M) on 2 L O2, sats ranged 83-95%, HR ranged 96-111. 6MW 9/19 780 ft =237.748mters On 3L of O2 Starting O2 94% Ending O2-85%  Starting heart rate-80 Ending heart rate- 110  RHC 12/18 RA = 5 RV = 56/9 PA = 52/17 (33) PCW = 10 Fick cardiac output/index = 7.5/4.5 PVR = 3.5 WU Ao sat = 96% PA sat = 73%, 72% High SVC sat = 76%  VQ scan low prob. CT chest mild COPD. Moderate pericardial effusion. 3v CAD. + cirrhosis. Hepatitis serologies negative.   PFTs  (FEV1 1.23 (52%) with FVC 2.01 (65%) and DLCO 51% in 7/15) PFT's 10/08/2015 FEV1 0.91 (40 %) ratio 56%DLCO 38 % corrects to 63 % for alv volume  PFTs  12/06/15 FEV1 1.0 (44%) FVC 1.74 (58%0 DLCO 28%  R/LHC 10/08/15 with normal coronaries, normal CO, and sever PAH with 13.6 WU.  Findings: Ao = 108/68 (86) LV = 102/10/11 RA = 9 RV = 97/8/12 PA = 104/49 (72) PCW =  31 Fick cardiac output/index = 3.96/2.34 PVR = 13.6 WU FA sat = 93% PA sat = 66%, 69%  RHC 5/18 RA = 2 RV = 48/4 PA = 45/17 (30) PCW = 8 Fick cardiac output/index = 4.9/3.0 PVR = 4.4 WU Ao sat = 94% PA sat = 68%, 70%  Review of systems complete and found to be negative unless listed in HPI.   SH:  Social History   Socioeconomic History  . Marital status: Married    Spouse name: Not on file  . Number of  children: Not on file  . Years of education: 2  . Highest education level: Not on file  Occupational History  . Occupation: retired Pharmacist, hospital  Tobacco Use  . Smoking status: Former Smoker    Packs/day: 1.00    Years: 40.00    Pack years: 40.00    Types: Cigarettes    Quit date: 02/22/2014    Years since quitting: 6.1  . Smokeless tobacco: Never Used  Vaping Use  . Vaping Use: Never used  Substance and Sexual Activity  . Alcohol use: No  . Drug use: No  . Sexual activity: Never  Other Topics Concern  . Not on file  Social History Narrative   Married with 2 children.  Independent of ADLs.      Does not have a living will.   Would desire CPR but would not want prolonged life support if futile- husband aware.   Social Determinants of Health   Financial Resource Strain:   . Difficulty of Paying Living Expenses:   Food Insecurity:   . Worried About Charity fundraiser in the Last Year:   . Arboriculturist in the Last Year:   Transportation Needs:   . Film/video editor (Medical):   [name] Lack of Transportation (Non-Medical):   Physical Activity:   . Days of Exercise per Week:   . Minutes of Exercise per Session:   Stress:   . Feeling of Stress :   Social Connections:   . Frequency of Communication with Friends and Family:   . Frequency of Social Gatherings with Friends and Family:   . Attends Religious Services:   . Active Member of Clubs or Organizations:   . Attends Archivist Meetings:   [name] Marital Status:   Intimate Partner Violence:   . Fear of Current or Ex-Partner:   . Emotionally Abused:   [name] Physically Abused:   . Sexually Abused:     FH:  Family History  Problem Relation Age of Onset  . Glaucoma Brother   . Vascular Disease Father        Died of complications from surgery  . Heart attack Father   . Asthma Mother        Died of asthma attack  . Dementia Mother   . Hyperlipidemia Mother   . Hypertension Mother     Past Medical History:    Diagnosis Date  . Acute respiratory failure with hypoxia and hypercapnea 02/26/2014  . Allergic rhinitis due to pollen   . Allergy   . Arthritis   . Asthma   . CHF (congestive heart failure) ([place])   . COPD (chronic obstructive pulmonary disease) ([place])   . Diastolic dysfunction    a. 02/2014 Echo: EF 65-70%, Gr 1 DD, RVH;  b. 09/2015 Echo: EF 55%, Gr 1 DD.  [name] Emphysema of lung ([place])   . H/O seasonal allergies   . History of chicken pox   .  History of tobacco abuse    a. Quit 2015.  [name] History of UTI   . Hypertension   . Hypertensive heart disease   . Lower extremity edema   . Pericardial effusion    a. 09/2015 Echo: Mod eff w/o tamponade.  . Right heart failure with reduced right ventricular function ([place])    a. 09/2015 Echo: EF 55%, Gr 1 DD, D shaped IV septum, sev dil RV with mod reduced fxn, mod dil RA, RV-RA grad 51mHg, PASP 885mg, mod pericard eff w/o tamponade.  . Severe Pulmonary Hypertension    a. 09/2015 Echo: PASP 8765m.    Current Outpatient Medications  Medication Sig Dispense Refill  . acetaminophen (TYLENOL) 325 MG tablet Take 325 mg by mouth at bedtime as needed for moderate pain or headache.     . albuterol (PROVENTIL HFA;VENTOLIN HFA) 108 (90 Base) MCG/ACT inhaler Inhale 2 puffs every 6 (six) hours as needed into the lungs for wheezing or shortness of breath. 1 Inhaler 11  . ambrisentan (LETAIRIS) 10 MG tablet Take 10 mg by mouth daily.    . aMarland Kitcheniodarone (PACERONE) 200 MG tablet Take 1 tablet (200 mg total) by mouth daily. 30 tablet 1  . apixaban (ELIQUIS) 5 MG TABS tablet Take 1 tablet (5 mg total) by mouth 2 (two) times daily. 60 tablet 1  . aspirin EC 81 MG EC tablet Take 1 tablet (81 mg total) by mouth daily. 30 tablet 0  . colchicine 0.6 MG tablet Take 0.5 tablets (0.3 mg total) by mouth 2 (two) times daily. 60 tablet 1  . fluticasone (FLONASE) 50 MCG/ACT nasal spray Place 1 spray into both nostrils daily.    . Fluticasone-Umeclidin-Vilant (TRELEGY ELLIPTA)  100-62.5-25 MCG/INH AEPB Inhale 2 puffs into the lungs daily.    . furosemide (LASIX) 40 MG tablet Take 1 tablet (40 mg total) by mouth daily. 30 tablet 1  . guaiFENesin (MUCINEX) 600 MG 12 hr tablet Take 1 tablet (600 mg total) by mouth 2 (two) times daily. 60 tablet 0  . hydrOXYzine (ATARAX/VISTARIL) 50 MG tablet Take 25-50 mg by mouth at bedtime as needed.    . iMarland Kitchenratropium (ATROVENT) 0.06 % nasal spray PLACE 2 SPRAYS INTO BOTH NOSTRILS 2 (TWO) TIMES DAILY 15 mL 3  . midodrine (PROAMATINE) 2.5 MG tablet Take 1 tablet (2.5 mg total) by mouth 3 (three) times daily as needed (SBP<100). 30 tablet 0  . OXYGEN 2 to 2lpm 24/7    . pantoprazole (PROTONIX) 40 MG tablet Take 1 tablet (40 mg total) by mouth daily at 12 noon. 30 tablet 0  . Polyethyl Glycol-Propyl Glycol (SYSTANE OP) Apply 1-2 drops to eye daily as needed (dry eyes).    . potassium chloride SA (KLOR-CON) 20 MEQ tablet Take 1 tablet (20 mEq total) by mouth daily. 90 tablet 1  . tadalafil, PAH, (ADCIRCA) 20 MG tablet Take 2 tablets (40 mg total) by mouth daily. 180 tablet 3   No current facility-administered medications for this encounter.    Vitals:   04/05/20 1553  BP: 103/65  Pulse: 87  SpO2: 98%  Weight: 57.1 kg (125 lb 12.8 oz)   Wt Readings from Last 3 Encounters:  04/05/20 57.1 kg (125 lb 12.8 oz)  03/27/20 56.9 kg (125 lb 8 oz)  03/23/20 55.9 kg (123 lb 4 oz)     PHYSICAL EXAM: General:  Well appearing. Wearing O2. No resp difficulty HEENT: normal + telengectasias Neck: supple. no JVD. Carotids 2+ bilat; no bruits. No lymphadenopathy or  thryomegaly appreciated. Cor: PMI nondisplaced. Regular rate & rhythm. 2/6 TR Lungs: decreased BS throughout Abdomen: soft, nontender, nondistended. No hepatosplenomegaly. No bruits or masses. Good bowel sounds. Extremities: no cyanosis, clubbing, rash, edema Neuro: alert & orientedx3, cranial nerves grossly intact. moves all 4 extremities w/o difficulty. Affect  pleasant   ASSESSMENT & PLAN:  1. Large pericardial effusion - Seen on echo in 6/21. Given PAH, telengectacias and recurrent effusions my suspicion for underlying CTD remains high but serology again negative - Resolved with steroids. Will need repeat echo in 2-3 months to re-assess  2. PAH with right heart failure - suspect combination of WHO Group I and III PAH - Echo 5/18 with complete resolution of RV strain. No significant TR to estimate RVSP - Echo 4/19 with EF 65% no evidence of RV strain  - echo today 12/09/19 EF 70-75% RV normal Personally reviewed - Repeat RHC 12/18 with mild PAH (mean PA 33) - Resolution of PAH on recent echos - Continue Letairis and tadalafil. Have previously discussed adding selexipeg but pressures down  3. Recurrent R pleural effusion - Given improvement in PA pressures doubt effusion related to severe PAH/ Previous ab CT with evidence of cirrhosis.   -  Fluid analysis = transudative.  -  Effusion much improved on CXR 12/2017 (small)  -  Rheum work-up negative so far. Has seen Dr. Amil Stevens and no evidence CTD. 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 Now controlled with with diuresis - No significant recurrence  4. Chronic Respiratory failure - Multifactorial. Continue supplemental O2.  - Follows with Dr. Melvyn Stevens.   5. COPD- Gold III  - on inhalers. Follows with Pulmonary. As above. No change.  6. Cirrhosis on CT - due to RHF. Hepatitis serologies are negative. Possibly due to right heart failure  7. PAF - in NSR today. Had first occurrence in setting of pericardial effusion in 6/21 - continue amio for now. Will reduce dose to 100 daily. Will stop in near future - continue apixaban. No bleeding  [name], MD  4:19 PM

## 2020-04-05 NOTE — Patient Instructions (Signed)
DECREASE Amiodarone 163m (1tab) daily   Your physician has requested that you have an echocardiogram. Echocardiography is a painless test that uses sound waves to create images of your heart. It provides your doctor with information about the size and shape of your heart and how well your heart's chambers and valves are working. This procedure takes approximately one hour. There are no restrictions for this procedure.    Please call our office in 2-3 months to set up your follow up appointment   If you have any questions or concerns before your next appointment please send [place] message through [place]or call our office at [phone]    TO LEAVE A MESSAGE FOR THE NURSE SELECT OPTION 2, PLEASE LEAVE A MESSAGE INCLUDING: . YOUR NAME . DATE OF BIRTH . CALL BACK NUMBER . REASON FOR CALL**this is important as we prioritize the call backs  [place] LONG AS YOU CALL BEFORE 4:00 PM  At the [facility] you and your health needs are our priority. As part of our continuing mission to provide you with exceptional heart care, we have created designated Provider Care Teams. These Care Teams include your primary Cardiologist (physician) and Advanced Practice Providers (APPs- Physician Assistants and Nurse Practitioners) who all work together to provide you with the care you need, when you need it.   You may see any of the following providers on your designated Care Team at your next follow up: .Marland KitchenDr DGlori Bickers. Dr DLoralie Champagne. ADarrick Grinder NP . BLyda Jester PA . LAudry Riles PharmD   Please be sure to bring in all your medications bottles to every appointment.

## 2020-04-07 DIAGNOSIS — J9601 Acute respiratory failure with hypoxia: Secondary | ICD-10-CM | POA: Diagnosis not present

## 2020-04-07 DIAGNOSIS — J45909 Unspecified asthma, uncomplicated: Secondary | ICD-10-CM | POA: Diagnosis not present

## 2020-04-07 DIAGNOSIS — R062 Wheezing: Secondary | ICD-10-CM | POA: Diagnosis not present

## 2020-04-07 DIAGNOSIS — J449 Chronic obstructive pulmonary disease, unspecified: Secondary | ICD-10-CM | POA: Diagnosis not present

## 2020-04-09 ENCOUNTER — Other Ambulatory Visit (HOSPITAL_COMMUNITY): Payer: Self-pay | Admitting: Internal Medicine

## 2020-04-09 ENCOUNTER — Other Ambulatory Visit (HOSPITAL_COMMUNITY): Payer: Self-pay | Admitting: *Deleted

## 2020-04-09 MED ORDER — AMIODARONE HCL 100 MG PO TABS: 100.0000 mg | ORAL_TABLET | Freq: Every day | ORAL | 6 refills | Status: DC

## 2020-04-10 ENCOUNTER — Other Ambulatory Visit: Payer: Self-pay | Admitting: Family Medicine

## 2020-04-10 ENCOUNTER — Other Ambulatory Visit (INDEPENDENT_AMBULATORY_CARE_PROVIDER_SITE_OTHER): Payer: Medicare HMO

## 2020-04-10 ENCOUNTER — Other Ambulatory Visit (HOSPITAL_COMMUNITY): Payer: Self-pay | Admitting: Internal Medicine

## 2020-04-10 ENCOUNTER — Other Ambulatory Visit: Payer: Self-pay

## 2020-04-10 DIAGNOSIS — E876 Hypokalemia: Secondary | ICD-10-CM | POA: Diagnosis not present

## 2020-04-10 LAB — BASIC METABOLIC PANEL
BUN: 18 mg/dL (ref 6–23)
CO2: 34 mEq/L — ABNORMAL HIGH (ref 19–32)
Calcium: 8.4 mg/dL (ref 8.4–10.5)
Chloride: 97 mEq/L (ref 96–112)
Creatinine, Ser: 1.11 mg/dL (ref 0.40–1.20)
GFR: 48.32 mL/min — ABNORMAL LOW (ref >60.00)
Glucose, Bld: 99 mg/dL (ref 70–99)
Potassium: 3.4 mEq/L — ABNORMAL LOW (ref 3.5–5.1)
Sodium: 137 mEq/L (ref 135–145)

## 2020-04-10 NOTE — Telephone Encounter (Signed)
Husband of pt calling requesting refill of Eliquis, Pantoprazole, Prednisone 86m and Amiodarone (which has already been filled by Cardiology 04/09/20) these medications seemed to have been filled by the provider from the hospital... please advise appropriate to refill or need to forward to Cardiology

## 2020-04-10 NOTE — Telephone Encounter (Addendum)
Pantoprazole refill sent.  Prednisone was a 3 week taper.  Should be done with prednisone.  Hospital discharge states to continue colchicine for 3 months.  I will refill colchicine for #30.  Then forward Dr Haroldine Laws the eliquis to refill.  See refill request sent today from pharmacy.   These refills have also been discussed with Linda Stevens.

## 2020-04-10 NOTE — Telephone Encounter (Signed)
Last office visit 03/27/2020 for hypokalemia.  Prednisone not on current medication list.  The other 3 medications were prescribed for patient when she was in the hospital. Ok to refill?

## 2020-04-10 NOTE — Telephone Encounter (Signed)
Contact patient.  I will fill pantoprazole. #90, 3 RF   Eliquis ( as well as amiodarone alredy filled.should go to cardiology.  Who is prescribing prednisone and what is she taking this for?

## 2020-04-12 ENCOUNTER — Telehealth: Payer: Self-pay

## 2020-04-12 DIAGNOSIS — D631 Anemia in chronic kidney disease: Secondary | ICD-10-CM | POA: Diagnosis not present

## 2020-04-12 DIAGNOSIS — I50810 Right heart failure, unspecified: Secondary | ICD-10-CM | POA: Diagnosis not present

## 2020-04-12 DIAGNOSIS — I272 Pulmonary hypertension, unspecified: Secondary | ICD-10-CM | POA: Diagnosis not present

## 2020-04-12 DIAGNOSIS — I130 Hypertensive heart and chronic kidney disease with heart failure and stage 1 through stage 4 chronic kidney disease, or unspecified chronic kidney disease: Secondary | ICD-10-CM | POA: Diagnosis not present

## 2020-04-12 DIAGNOSIS — J439 Emphysema, unspecified: Secondary | ICD-10-CM | POA: Diagnosis not present

## 2020-04-12 DIAGNOSIS — J9612 Chronic respiratory failure with hypercapnia: Secondary | ICD-10-CM | POA: Diagnosis not present

## 2020-04-12 DIAGNOSIS — I5032 Chronic diastolic (congestive) heart failure: Secondary | ICD-10-CM | POA: Diagnosis not present

## 2020-04-12 DIAGNOSIS — J9611 Chronic respiratory failure with hypoxia: Secondary | ICD-10-CM | POA: Diagnosis not present

## 2020-04-12 DIAGNOSIS — I480 Paroxysmal atrial fibrillation: Secondary | ICD-10-CM | POA: Diagnosis not present

## 2020-04-12 DIAGNOSIS — N1831 Chronic kidney disease, stage 3a: Secondary | ICD-10-CM | POA: Diagnosis not present

## 2020-04-12 NOTE — Telephone Encounter (Signed)
Elmyra Ricks with [facility] called to let you know that she evaluated pt and her BP readings have been running low 1st blood pressure was 82/35, after completing some exercises her BP was 100/40... Elmyra Ricks reports pt is asymptomatic, not sure if it is dehydration as pt is on restriction of fluid intake due to CHF.... She stated she told patient to check her BP 2 times daily and keep a log in the meantime   Elmyra Ricks said that you can call her back or the pt with any instructions

## 2020-04-12 NOTE — Telephone Encounter (Signed)
She is on midodrine to keep up her blood pressure, this may need to be adjusted versus following given she is asymptomatic. I will forward the info to her cardiologist   CC: Dr. Haroldine Laws

## 2020-04-13 NOTE — Telephone Encounter (Signed)
Elmyra Ricks notified as instructed by telephone.

## 2020-04-16 ENCOUNTER — Other Ambulatory Visit (HOSPITAL_COMMUNITY): Payer: Self-pay | Admitting: *Deleted

## 2020-04-16 DIAGNOSIS — J9612 Chronic respiratory failure with hypercapnia: Secondary | ICD-10-CM | POA: Diagnosis not present

## 2020-04-16 DIAGNOSIS — J439 Emphysema, unspecified: Secondary | ICD-10-CM | POA: Diagnosis not present

## 2020-04-16 DIAGNOSIS — J9611 Chronic respiratory failure with hypoxia: Secondary | ICD-10-CM | POA: Diagnosis not present

## 2020-04-16 DIAGNOSIS — N1831 Chronic kidney disease, stage 3a: Secondary | ICD-10-CM | POA: Diagnosis not present

## 2020-04-16 DIAGNOSIS — I130 Hypertensive heart and chronic kidney disease with heart failure and stage 1 through stage 4 chronic kidney disease, or unspecified chronic kidney disease: Secondary | ICD-10-CM | POA: Diagnosis not present

## 2020-04-16 DIAGNOSIS — I5032 Chronic diastolic (congestive) heart failure: Secondary | ICD-10-CM | POA: Diagnosis not present

## 2020-04-16 DIAGNOSIS — D631 Anemia in chronic kidney disease: Secondary | ICD-10-CM | POA: Diagnosis not present

## 2020-04-16 DIAGNOSIS — I50810 Right heart failure, unspecified: Secondary | ICD-10-CM | POA: Diagnosis not present

## 2020-04-16 DIAGNOSIS — I480 Paroxysmal atrial fibrillation: Secondary | ICD-10-CM | POA: Diagnosis not present

## 2020-04-16 DIAGNOSIS — I272 Pulmonary hypertension, unspecified: Secondary | ICD-10-CM | POA: Diagnosis not present

## 2020-04-16 MED ORDER — APIXABAN 5 MG PO TABS: 5.0000 mg | ORAL_TABLET | Freq: Two times a day (BID) | ORAL | 3 refills | Status: DC

## 2020-04-25 DIAGNOSIS — N1831 Chronic kidney disease, stage 3a: Secondary | ICD-10-CM | POA: Diagnosis not present

## 2020-04-25 DIAGNOSIS — I480 Paroxysmal atrial fibrillation: Secondary | ICD-10-CM | POA: Diagnosis not present

## 2020-04-25 DIAGNOSIS — I50810 Right heart failure, unspecified: Secondary | ICD-10-CM | POA: Diagnosis not present

## 2020-04-25 DIAGNOSIS — I130 Hypertensive heart and chronic kidney disease with heart failure and stage 1 through stage 4 chronic kidney disease, or unspecified chronic kidney disease: Secondary | ICD-10-CM | POA: Diagnosis not present

## 2020-04-25 DIAGNOSIS — D631 Anemia in chronic kidney disease: Secondary | ICD-10-CM | POA: Diagnosis not present

## 2020-04-25 DIAGNOSIS — J9611 Chronic respiratory failure with hypoxia: Secondary | ICD-10-CM | POA: Diagnosis not present

## 2020-04-25 DIAGNOSIS — J9612 Chronic respiratory failure with hypercapnia: Secondary | ICD-10-CM | POA: Diagnosis not present

## 2020-04-25 DIAGNOSIS — J439 Emphysema, unspecified: Secondary | ICD-10-CM | POA: Diagnosis not present

## 2020-04-25 DIAGNOSIS — I5032 Chronic diastolic (congestive) heart failure: Secondary | ICD-10-CM | POA: Diagnosis not present

## 2020-04-25 DIAGNOSIS — I272 Pulmonary hypertension, unspecified: Secondary | ICD-10-CM | POA: Diagnosis not present

## 2020-04-26 ENCOUNTER — Other Ambulatory Visit: Payer: Self-pay

## 2020-04-26 MED ORDER — MIDODRINE HCL 2.5 MG PO TABS: 2.5000 mg | ORAL_TABLET | Freq: Three times a day (TID) | ORAL | 2 refills | Status: DC | PRN

## 2020-04-26 NOTE — Telephone Encounter (Signed)
Cardiology

## 2020-04-26 NOTE — Telephone Encounter (Signed)
Pt's husband called back and left a message on triage line asking if Dr Diona Browner or Dr Wonda Cerise should do the refills on the midodrine. She is needing a refill.

## 2020-04-26 NOTE — Telephone Encounter (Signed)
We had also received fax refill request.  I did not know this should go to cardiologist at that time so I did send refills in to her pharmacy.  FYI to Dr. Diona Browner.

## 2020-04-30 ENCOUNTER — Telehealth (HOSPITAL_COMMUNITY): Payer: Self-pay | Admitting: *Deleted

## 2020-04-30 NOTE — Telephone Encounter (Signed)
Pts daughter Johnell Comings [phone]) called to report pts bp has been running low when she has physical therapy systolic bp in the 14'N. Pt PCP prescribed midodrine three times daily If systolic bp was below 829.  Pt said she "just doesn't feel well". Pt can not describe how she feels denies chest pain, worsening shortness of breath, or edema. Pt does c/o being more fatigued.  Pt said she has felt worse since stopping prednisone. Pts daughter wants to know if pts meds need to be changed or does she need an office visit. Pt said Dr.Bensimhon told her to call if she started feeling bad.   Routed to [place] for advice

## 2020-05-01 ENCOUNTER — Telehealth (HOSPITAL_COMMUNITY): Payer: Self-pay | Admitting: Vascular Surgery

## 2020-05-01 DIAGNOSIS — I130 Hypertensive heart and chronic kidney disease with heart failure and stage 1 through stage 4 chronic kidney disease, or unspecified chronic kidney disease: Secondary | ICD-10-CM | POA: Diagnosis not present

## 2020-05-01 DIAGNOSIS — J9612 Chronic respiratory failure with hypercapnia: Secondary | ICD-10-CM | POA: Diagnosis not present

## 2020-05-01 DIAGNOSIS — J9611 Chronic respiratory failure with hypoxia: Secondary | ICD-10-CM | POA: Diagnosis not present

## 2020-05-01 DIAGNOSIS — I5032 Chronic diastolic (congestive) heart failure: Secondary | ICD-10-CM | POA: Diagnosis not present

## 2020-05-01 DIAGNOSIS — N1831 Chronic kidney disease, stage 3a: Secondary | ICD-10-CM | POA: Diagnosis not present

## 2020-05-01 DIAGNOSIS — I480 Paroxysmal atrial fibrillation: Secondary | ICD-10-CM | POA: Diagnosis not present

## 2020-05-01 DIAGNOSIS — I50810 Right heart failure, unspecified: Secondary | ICD-10-CM | POA: Diagnosis not present

## 2020-05-01 DIAGNOSIS — D631 Anemia in chronic kidney disease: Secondary | ICD-10-CM | POA: Diagnosis not present

## 2020-05-01 DIAGNOSIS — I272 Pulmonary hypertension, unspecified: Secondary | ICD-10-CM | POA: Diagnosis not present

## 2020-05-01 DIAGNOSIS — J439 Emphysema, unspecified: Secondary | ICD-10-CM | POA: Diagnosis not present

## 2020-05-01 NOTE — Telephone Encounter (Signed)
Waiting to hear back from provider. Second message sent to provider today.

## 2020-05-01 NOTE — Telephone Encounter (Signed)
Let's restart prednisone at 24m daily and see if that helps.

## 2020-05-01 NOTE — Telephone Encounter (Signed)
Pt left message message on scheduling line stating her daughter has been talking to [place], pt states she needs to be seen today for SOB .[name] Please advise

## 2020-05-01 NOTE — Telephone Encounter (Signed)
pts daughter called back stating pt feels worse. Pt now complains of shortness of breath and just overall not feeling well. Patient has refused to go to the emergency room. Pts daughter asking for guidance from [facility]   Routed to [place]

## 2020-05-02 MED ORDER — PREDNISONE 20 MG PO TABS: 20.0000 mg | ORAL_TABLET | Freq: Every day | ORAL | 0 refills | Status: DC

## 2020-05-02 NOTE — Telephone Encounter (Signed)
pts daughter aware

## 2020-05-03 ENCOUNTER — Other Ambulatory Visit: Payer: Self-pay | Admitting: Family Medicine

## 2020-05-03 ENCOUNTER — Other Ambulatory Visit (HOSPITAL_COMMUNITY): Payer: Self-pay | Admitting: *Deleted

## 2020-05-03 DIAGNOSIS — I272 Pulmonary hypertension, unspecified: Secondary | ICD-10-CM

## 2020-05-03 NOTE — Telephone Encounter (Signed)
Last office visit 03/27/2020 for Hypokalemia.  Last refilled 04/10/2020 for #30 with no refills.  Hospital discharge states to continue colchicine for 3 months.  Refill?

## 2020-05-08 ENCOUNTER — Other Ambulatory Visit (HOSPITAL_COMMUNITY): Payer: Medicare HMO

## 2020-05-08 ENCOUNTER — Ambulatory Visit (HOSPITAL_COMMUNITY): Payer: Medicare HMO

## 2020-05-08 DIAGNOSIS — J9601 Acute respiratory failure with hypoxia: Secondary | ICD-10-CM | POA: Diagnosis not present

## 2020-05-08 DIAGNOSIS — J449 Chronic obstructive pulmonary disease, unspecified: Secondary | ICD-10-CM | POA: Diagnosis not present

## 2020-05-08 DIAGNOSIS — J45909 Unspecified asthma, uncomplicated: Secondary | ICD-10-CM | POA: Diagnosis not present

## 2020-05-08 DIAGNOSIS — R062 Wheezing: Secondary | ICD-10-CM | POA: Diagnosis not present

## 2020-05-14 NOTE — Progress Notes (Signed)
Incorrect

## 2020-05-22 ENCOUNTER — Other Ambulatory Visit (HOSPITAL_COMMUNITY): Payer: Self-pay | Admitting: Internal Medicine

## 2020-05-29 ENCOUNTER — Other Ambulatory Visit: Payer: Self-pay | Admitting: Family Medicine

## 2020-05-29 ENCOUNTER — Telehealth (HOSPITAL_COMMUNITY): Payer: Self-pay | Admitting: *Deleted

## 2020-05-29 NOTE — Telephone Encounter (Signed)
Last office visit 03/27/2020 for Hypokalemia.  Last refilled 05/03/2020 for #30 with no refills. Hospital discharge states to continue colchicine for 3 months.  Refill?

## 2020-05-29 NOTE — Telephone Encounter (Signed)
Pts daughter left VM stating patient is more short of breath and wants an appointment with Dr.Bensimhon.  Attempted to reach daughter [name] back at [phone] no answer/left VM.

## 2020-05-31 ENCOUNTER — Other Ambulatory Visit (HOSPITAL_COMMUNITY): Payer: Self-pay | Admitting: *Deleted

## 2020-06-05 ENCOUNTER — Encounter (HOSPITAL_COMMUNITY): Payer: Self-pay | Admitting: Emergency Medicine

## 2020-06-05 ENCOUNTER — Inpatient Hospital Stay (HOSPITAL_COMMUNITY)
Admission: EM | Admit: 2020-06-05 | Discharge: 2020-06-09 | DRG: 378 | Disposition: A | Discharge status: Home Health | Payer: Medicare HMO | Attending: Internal Medicine | Admitting: Internal Medicine

## 2020-06-05 ENCOUNTER — Emergency Department (HOSPITAL_COMMUNITY): Payer: Medicare HMO

## 2020-06-05 DIAGNOSIS — I959 Hypotension, unspecified: Secondary | ICD-10-CM | POA: Diagnosis not present

## 2020-06-05 DIAGNOSIS — I2721 Secondary pulmonary arterial hypertension: Secondary | ICD-10-CM | POA: Diagnosis not present

## 2020-06-05 DIAGNOSIS — K922 Gastrointestinal hemorrhage, unspecified: Secondary | ICD-10-CM | POA: Diagnosis not present

## 2020-06-05 DIAGNOSIS — J439 Emphysema, unspecified: Secondary | ICD-10-CM | POA: Diagnosis present

## 2020-06-05 DIAGNOSIS — I110 Hypertensive heart disease with heart failure: Secondary | ICD-10-CM | POA: Diagnosis present

## 2020-06-05 DIAGNOSIS — Z825 Family history of asthma and other chronic lower respiratory diseases: Secondary | ICD-10-CM | POA: Diagnosis not present

## 2020-06-05 DIAGNOSIS — Z20822 Contact with and (suspected) exposure to covid-19: Secondary | ICD-10-CM | POA: Diagnosis not present

## 2020-06-05 DIAGNOSIS — Z87891 Personal history of nicotine dependence: Secondary | ICD-10-CM | POA: Diagnosis not present

## 2020-06-05 DIAGNOSIS — K449 Diaphragmatic hernia without obstruction or gangrene: Secondary | ICD-10-CM | POA: Diagnosis not present

## 2020-06-05 DIAGNOSIS — Z7901 Long term (current) use of anticoagulants: Secondary | ICD-10-CM

## 2020-06-05 DIAGNOSIS — K5521 Angiodysplasia of colon with hemorrhage: Principal | ICD-10-CM | POA: Diagnosis present

## 2020-06-05 DIAGNOSIS — R0602 Shortness of breath: Secondary | ICD-10-CM | POA: Diagnosis not present

## 2020-06-05 DIAGNOSIS — Z209 Contact with and (suspected) exposure to unspecified communicable disease: Secondary | ICD-10-CM | POA: Diagnosis not present

## 2020-06-05 DIAGNOSIS — B3781 Candidal esophagitis: Secondary | ICD-10-CM | POA: Diagnosis not present

## 2020-06-05 DIAGNOSIS — Z7982 Long term (current) use of aspirin: Secondary | ICD-10-CM | POA: Diagnosis not present

## 2020-06-05 DIAGNOSIS — K229 Disease of esophagus, unspecified: Secondary | ICD-10-CM | POA: Diagnosis not present

## 2020-06-05 DIAGNOSIS — Z79899 Other long term (current) drug therapy: Secondary | ICD-10-CM

## 2020-06-05 DIAGNOSIS — I272 Pulmonary hypertension, unspecified: Secondary | ICD-10-CM

## 2020-06-05 DIAGNOSIS — Z83438 Family history of other disorder of lipoprotein metabolism and other lipidemia: Secondary | ICD-10-CM | POA: Diagnosis not present

## 2020-06-05 DIAGNOSIS — D62 Acute posthemorrhagic anemia: Secondary | ICD-10-CM | POA: Diagnosis not present

## 2020-06-05 DIAGNOSIS — J449 Chronic obstructive pulmonary disease, unspecified: Secondary | ICD-10-CM

## 2020-06-05 DIAGNOSIS — R062 Wheezing: Secondary | ICD-10-CM | POA: Diagnosis not present

## 2020-06-05 DIAGNOSIS — Z66 Do not resuscitate: Secondary | ICD-10-CM | POA: Diagnosis not present

## 2020-06-05 DIAGNOSIS — Z83511 Family history of glaucoma: Secondary | ICD-10-CM | POA: Diagnosis not present

## 2020-06-05 DIAGNOSIS — D649 Anemia, unspecified: Secondary | ICD-10-CM | POA: Diagnosis present

## 2020-06-05 DIAGNOSIS — I5032 Chronic diastolic (congestive) heart failure: Secondary | ICD-10-CM

## 2020-06-05 DIAGNOSIS — Z8249 Family history of ischemic heart disease and other diseases of the circulatory system: Secondary | ICD-10-CM | POA: Diagnosis not present

## 2020-06-05 DIAGNOSIS — K621 Rectal polyp: Secondary | ICD-10-CM | POA: Diagnosis not present

## 2020-06-05 DIAGNOSIS — I480 Paroxysmal atrial fibrillation: Secondary | ICD-10-CM | POA: Diagnosis not present

## 2020-06-05 DIAGNOSIS — N179 Acute kidney failure, unspecified: Secondary | ICD-10-CM | POA: Diagnosis not present

## 2020-06-05 DIAGNOSIS — D123 Benign neoplasm of transverse colon: Secondary | ICD-10-CM | POA: Diagnosis present

## 2020-06-05 DIAGNOSIS — R531 Weakness: Secondary | ICD-10-CM | POA: Diagnosis not present

## 2020-06-05 DIAGNOSIS — R195 Other fecal abnormalities: Secondary | ICD-10-CM

## 2020-06-05 DIAGNOSIS — K5520 Angiodysplasia of colon without hemorrhage: Secondary | ICD-10-CM | POA: Diagnosis not present

## 2020-06-05 DIAGNOSIS — K635 Polyp of colon: Secondary | ICD-10-CM | POA: Diagnosis not present

## 2020-06-05 DIAGNOSIS — K5730 Diverticulosis of large intestine without perforation or abscess without bleeding: Secondary | ICD-10-CM | POA: Diagnosis not present

## 2020-06-05 DIAGNOSIS — K648 Other hemorrhoids: Secondary | ICD-10-CM | POA: Diagnosis not present

## 2020-06-05 DIAGNOSIS — J45909 Unspecified asthma, uncomplicated: Secondary | ICD-10-CM | POA: Diagnosis not present

## 2020-06-05 DIAGNOSIS — Z888 Allergy status to other drugs, medicaments and biological substances status: Secondary | ICD-10-CM

## 2020-06-05 DIAGNOSIS — J90 Pleural effusion, not elsewhere classified: Secondary | ICD-10-CM | POA: Diagnosis not present

## 2020-06-05 DIAGNOSIS — M1990 Unspecified osteoarthritis, unspecified site: Secondary | ICD-10-CM | POA: Diagnosis present

## 2020-06-05 DIAGNOSIS — D128 Benign neoplasm of rectum: Secondary | ICD-10-CM | POA: Diagnosis present

## 2020-06-05 DIAGNOSIS — J9601 Acute respiratory failure with hypoxia: Secondary | ICD-10-CM | POA: Diagnosis not present

## 2020-06-05 DIAGNOSIS — Z881 Allergy status to other antibiotic agents status: Secondary | ICD-10-CM

## 2020-06-05 LAB — CBC
HCT: 11.6 % — ABNORMAL LOW (ref 36.0–46.0)
Hemoglobin: 3.1 g/dL — CL (ref 12.0–15.0)
MCH: 23.1 pg — ABNORMAL LOW (ref 26.0–34.0)
MCHC: 26.7 g/dL — ABNORMAL LOW (ref 30.0–36.0)
MCV: 86.6 fL (ref 80.0–100.0)
Platelets: 417 10*3/uL — ABNORMAL HIGH (ref 150–400)
RBC: 1.34 MIL/uL — ABNORMAL LOW (ref 3.87–5.11)
RDW: 19.4 % — ABNORMAL HIGH (ref 11.5–15.5)
WBC: 17.5 10*3/uL — ABNORMAL HIGH (ref 4.0–10.5)
nRBC: 0.7 % — ABNORMAL HIGH (ref 0.0–0.2)

## 2020-06-05 LAB — POC OCCULT BLOOD, ED: Fecal Occult Bld: POSITIVE — AB

## 2020-06-05 LAB — BASIC METABOLIC PANEL
Anion gap: 11 (ref 5–15)
BUN: 27 mg/dL — ABNORMAL HIGH (ref 8–23)
CO2: 29 mmol/L (ref 22–32)
Calcium: 8.7 mg/dL — ABNORMAL LOW (ref 8.9–10.3)
Chloride: 99 mmol/L (ref 98–111)
Creatinine, Ser: 1.69 mg/dL — ABNORMAL HIGH (ref 0.44–1.00)
GFR calc Af Amer: 35 mL/min — ABNORMAL LOW (ref >60)
GFR calc non Af Amer: 30 mL/min — ABNORMAL LOW (ref >60)
Glucose, Bld: 119 mg/dL — ABNORMAL HIGH (ref 70–99)
Potassium: 4.2 mmol/L (ref 3.5–5.1)
Sodium: 139 mmol/L (ref 135–145)

## 2020-06-05 LAB — TROPONIN I (HIGH SENSITIVITY)
Troponin I (High Sensitivity): 15 ng/L (ref <18)
Troponin I (High Sensitivity): 16 ng/L (ref <18)

## 2020-06-05 LAB — PREPARE RBC (CROSSMATCH)

## 2020-06-05 LAB — RESPIRATORY PANEL BY RT PCR (FLU A&B, COVID)
Influenza A by PCR: NEGATIVE
Influenza B by PCR: NEGATIVE
SARS Coronavirus 2 by RT PCR: NEGATIVE

## 2020-06-05 LAB — ABO/RH: ABO/RH(D): A POS

## 2020-06-05 MED ORDER — AMIODARONE HCL 200 MG PO TABS
100.0000 mg | ORAL_TABLET | Freq: Every day | ORAL | Status: DC
  Administered 2020-06-05 – 2020-06-09 (×5): 100 mg via ORAL
  Filled 2020-06-05 (×5): qty 1

## 2020-06-05 MED ORDER — ONDANSETRON HCL 4 MG/2ML IJ SOLN: 4.0000 mg | Freq: Four times a day (QID) | INTRAMUSCULAR | Status: DC | PRN

## 2020-06-05 MED ORDER — AMBRISENTAN 5 MG PO TABS
10.0000 mg | ORAL_TABLET | Freq: Every day | ORAL | Status: DC
  Administered 2020-06-06 – 2020-06-09 (×4): 10 mg via ORAL
  Filled 2020-06-05 (×4): qty 2

## 2020-06-05 MED ORDER — PREDNISONE 20 MG PO TABS: 20.0000 mg | ORAL_TABLET | Freq: Every day | ORAL | Status: DC

## 2020-06-05 MED ORDER — ACETAMINOPHEN 325 MG PO TABS: 650.0000 mg | ORAL_TABLET | Freq: Four times a day (QID) | ORAL | Status: DC | PRN

## 2020-06-05 MED ORDER — SODIUM CHLORIDE 0.9 % IV SOLN
10.0000 mL/h | Freq: Once | INTRAVENOUS | Status: AC
  Administered 2020-06-06: 10 mL/h via INTRAVENOUS

## 2020-06-05 MED ORDER — ALBUTEROL SULFATE HFA 108 (90 BASE) MCG/ACT IN AERS
2.0000 | INHALATION_SPRAY | Freq: Four times a day (QID) | RESPIRATORY_TRACT | Status: DC | PRN
  Filled 2020-06-05: qty 6.7

## 2020-06-05 MED ORDER — ONDANSETRON HCL 4 MG PO TABS: 4.0000 mg | ORAL_TABLET | Freq: Four times a day (QID) | ORAL | Status: DC | PRN

## 2020-06-05 MED ORDER — ACETAMINOPHEN 650 MG RE SUPP: 650.0000 mg | Freq: Four times a day (QID) | RECTAL | Status: DC | PRN

## 2020-06-05 MED ORDER — PANTOPRAZOLE SODIUM 40 MG PO TBEC
40.0000 mg | DELAYED_RELEASE_TABLET | Freq: Every day | ORAL | Status: DC
  Administered 2020-06-06 – 2020-06-09 (×4): 40 mg via ORAL
  Filled 2020-06-05 (×4): qty 1

## 2020-06-05 MED ORDER — TADALAFIL 20 MG PO TABS
40.0000 mg | ORAL_TABLET | Freq: Every day | ORAL | Status: DC
  Administered 2020-06-06 – 2020-06-09 (×4): 40 mg via ORAL
  Filled 2020-06-05 (×5): qty 2

## 2020-06-05 NOTE — ED Provider Notes (Signed)
[facility] EMERGENCY DEPARTMENT Provider Note   CSN: [id number] Arrival date & time: 06/05/20  1108     History Chief Complaint  Patient presents with   Shortness of Breath    Linda Stevens is a 74 y.o. female.  HPI She presents for evaluation of gradually worse shortness of breath over several weeks time.  She denies fever, chills, cough, nausea, vomiting, focal weakness or paresthesia.  She is not having any chest pain, palpitations, or back pain.  She has not noticed any discoloration of her stool or urine.  No prior similar problems.  She has had prior fecal occult blood testing but never had a colonoscopy.  There are no other known modifying factors.    Past Medical History:  Diagnosis Date   Acute respiratory failure with hypoxia and hypercapnea 02/26/2014   Allergic rhinitis due to pollen    Allergy    Arthritis    Asthma    CHF (congestive heart failure) (HCC)    COPD (chronic obstructive pulmonary disease) (HCC)    Diastolic dysfunction    a. 02/2014 Echo: EF 65-70%, Gr 1 DD, RVH;  b. 09/2015 Echo: EF 55%, Gr 1 DD.   Emphysema of lung (HCC)    H/O seasonal allergies    History of chicken pox    History of tobacco abuse    a. Quit 2015.   History of UTI    Hypertension    Hypertensive heart disease    Lower extremity edema    Pericardial effusion    a. 09/2015 Echo: Mod eff w/o tamponade.   Right heart failure with reduced right ventricular function ([place])    a. 09/2015 Echo: EF 55%, Gr 1 DD, D shaped IV septum, sev dil RV with mod reduced fxn, mod dil RA, RV-RA grad 59mHg, PASP 876mg, mod pericard eff w/o tamponade.   Severe Pulmonary Hypertension    a. 09/2015 Echo: PASP 8760m.    Patient Active Problem List   Diagnosis Date Noted   DNR (do not resuscitate)    Palliative care by specialist    Positive blood cultures 02/20/2020   Community acquired pneumonia 02/19/2020   New onset atrial fibrillation ([place]/13/2021   Atrial fibrillation with RVR (HCC)    Chronic respiratory failure with hypoxia and hypercapnia ([place]0/31/2018   Pleural effusion, bilateral 07/08/2017   Chronic respiratory failure with hypoxia ([place]CC[place]2/03/2016   Hepatic cirrhosis ([place]CC[place]2/02/2016   Pulmonary artery hypertension (HCC)    Acute on chronic diastolic congestive heart failure ([place]  Acute on chronic respiratory failure with hypoxia (HCC)    COPD GOLD II criteria but 02 dep 24/7     Severe Pulmonary Hypertension    Pericardial effusion    Diastolic dysfunction    Congestive heart failure (CHF) ([place]CC[place]1/23/2017   Goals of care, counseling/discussion 08/47/42/5956Mild diastolic dysfunction 06/38/75/6433Acute respiratory failure with hypoxia and hypercapnea 02/26/2014   Hypokalemia 02/22/2014   HTN (hypertension) 03/19/2012   Allergic rhinitis 01/15/2007    Past Surgical History:  Procedure Laterality Date   CARDIAC CATHETERIZATION N/A 10/08/2015   Procedure: Right/Left Heart Cath and Coronary Angiography;  Surgeon: DanJolaine ArtistD;  Location: [facility];  Service: Cardiovascular;  Laterality: N/A;   CHOLECYSTECTOMY     Open   RIGHT HEART CATH N/A 01/26/2017   Procedure: Right Heart Cath;  Surgeon: BenJolaine ArtistD;  Location: [facility];  Service:  Cardiovascular;  Laterality: N/A;   RIGHT HEART CATH N/A 08/12/2017   Procedure: RIGHT HEART CATH;  Surgeon: Jolaine Artist, MD;  Location: [facility];  Service: Cardiovascular;  Laterality: N/A;     OB History   No obstetric history on file.     Family History  Problem Relation Age of Onset   Glaucoma Brother    Vascular Disease Father        Died of complications from surgery   Heart attack Father    Asthma Mother        Died of asthma attack   Dementia Mother    Hyperlipidemia Mother    Hypertension Mother     Social History   Tobacco Use   Smoking status: Former Smoker     Packs/day: 1.00    Years: 40.00    Pack years: 40.00    Types: Cigarettes    Quit date: 02/22/2014    Years since quitting: 6.2   Smokeless tobacco: Never Used  Vaping Use   Vaping Use: Never used  Substance Use Topics   Alcohol use: No   Drug use: No    Home Medications Prior to Admission medications   Medication Sig Start Date End Date Taking? Authorizing Provider  acetaminophen (TYLENOL) 325 MG tablet Take 325 mg by mouth at bedtime as needed for moderate pain or headache.    Yes [provider]  albuterol (PROVENTIL HFA;VENTOLIN HFA) 108 (90 Base) MCG/ACT inhaler Inhale 2 puffs every 6 (six) hours as needed into the lungs for wheezing or shortness of breath. 07/17/17  Yes Tanda Rockers, MD  ambrisentan (LETAIRIS) 10 MG tablet TAKE 1 TABLET BY MOUTH DAILY. DO NOT HANDLE IF PREGNANT. DO NOT SPLIT, CRUSH, OR CHEW. AVOID INHALATION AND CONTACT WITH SKIN OR EYES . CALL [phone] TO REFILL. Patient taking differently: Take 10 mg by mouth daily.  04/09/20  Yes Bensimhon, Shaune Pascal, MD  amiodarone (PACERONE) 100 MG tablet Take 1 tablet (100 mg total) by mouth daily. 04/09/20  Yes Bensimhon, Shaune Pascal, MD  apixaban (ELIQUIS) 5 MG TABS tablet Take 1 tablet (5 mg total) by mouth 2 (two) times daily. 04/16/20  Yes Bensimhon, Shaune Pascal, MD  aspirin EC 81 MG EC tablet Take 1 tablet (81 mg total) by mouth daily. 10/09/15  Yes Debbe Odea, MD  colchicine 0.6 MG tablet TAKE 1/2 A TABLET BY MOUTH TWICE DAILY Patient taking differently: Take 0.3 mg by mouth 2 (two) times daily.  05/29/20  Yes Bedsole, Amy E, MD  ELIQUIS 5 MG TABS tablet TAKE 1 TABLET BY MOUTH TWICE A DAY Patient taking differently: Take 5 mg by mouth 2 (two) times daily.  04/26/20  Yes Bedsole, Amy E, MD  furosemide (LASIX) 40 MG tablet Take 1 tablet (40 mg total) by mouth daily. 03/11/20  Yes Hosie Poisson, MD  pantoprazole (PROTONIX) 40 MG tablet TAKE 1 TABLET BY MOUTH EVERY DAY Patient taking differently: Take 40 mg by  mouth daily.  04/10/20  Yes Bedsole, Amy E, MD  potassium chloride SA (KLOR-CON) 20 MEQ tablet Take 1 tablet (20 mEq total) by mouth daily. 03/27/20  Yes Bedsole, Amy E, MD  predniSONE (DELTASONE) 20 MG tablet TAKE 1 TABLET BY MOUTH EVERY DAY 05/22/20  Yes Bensimhon, Shaune Pascal, MD  spironolactone (ALDACTONE) 25 MG tablet Take 12.5 mg by mouth daily.   Yes [provider]  tadalafil, PAH, (ADCIRCA) 20 MG tablet TAKE 2 TABLETS DAILY Generic for Adcirca Patient taking differently:  Take 40 mg by mouth daily.  04/10/20  Yes Bensimhon, Shaune Pascal, MD  guaiFENesin (MUCINEX) 600 MG 12 hr tablet Take 1 tablet (600 mg total) by mouth 2 (two) times daily. Patient not taking: Reported on 06/05/2020 03/10/20   Hosie Poisson, MD  ipratropium (ATROVENT) 0.06 % nasal spray PLACE 2 SPRAYS INTO BOTH NOSTRILS 2 (TWO) TIMES DAILY Patient not taking: Reported on 06/05/2020 01/24/20   Bensimhon, Shaune Pascal, MD  midodrine (PROAMATINE) 2.5 MG tablet Take 1 tablet (2.5 mg total) by mouth 3 (three) times daily as needed (SBP<100). Patient not taking: Reported on 06/05/2020 04/26/20   [name], MD  OXYGEN 2 to 2lpm 24/7    [provider]    Allergies    Amoxicillin-pot clavulanate, Cefdinir, Moxifloxacin, and Singulair [montelukast sodium]  Review of Systems   Review of Systems  All other systems reviewed and are negative.   Physical Exam Updated Vital Signs BP (!) 98/41    Pulse 89    Temp (!) 97.5 F (36.4 C) (Oral)    Resp 15    Ht _0  (1.6 m)    Wt 56.2 kg    SpO2 96%    BMI 21.97 kg/m   Physical Exam Vitals and nursing note reviewed.  Constitutional:      General: She is not in acute distress.    Appearance: She is well-developed. She is not ill-appearing, toxic-appearing or diaphoretic.  HENT:     Head: Normocephalic and atraumatic.     Right Ear: External ear normal.     Left Ear: External ear normal.  Eyes:     Conjunctiva/sclera: Conjunctivae normal.     Pupils: Pupils are equal,  round, and reactive to light.  Neck:     Trachea: Phonation normal.  Cardiovascular:     Rate and Rhythm: Normal rate and regular rhythm.     Heart sounds: Normal heart sounds.  Pulmonary:     Effort: Pulmonary effort is normal. No respiratory distress.     Breath sounds: Normal breath sounds. No stridor. No rhonchi.  Chest:     Chest wall: No tenderness.  Abdominal:     General: There is no distension.     Palpations: Abdomen is soft.     Tenderness: There is no abdominal tenderness.  Genitourinary:    Comments: Normal anus.  Dark stool in rectum without rectal mass or visible rectal bleeding. Musculoskeletal:        General: Normal range of motion.     Cervical back: Normal range of motion and neck supple.  Skin:    General: Skin is warm and dry.  Neurological:     Mental Status: She is alert and oriented to person, place, and time.     Cranial Nerves: No cranial nerve deficit.     Sensory: No sensory deficit.     Motor: No abnormal muscle tone.     Coordination: Coordination normal.  Psychiatric:        Mood and Affect: Mood normal.        Behavior: Behavior normal.        Thought Content: Thought content normal.        Judgment: Judgment normal.     ED Results / Procedures / Treatments   Labs (all labs ordered are listed, but only abnormal results are displayed) Labs Reviewed  BASIC METABOLIC PANEL - Abnormal; Notable for the following components:      Result Value   Glucose, Bld 119 (*)  BUN 27 (*)    Creatinine, Ser 1.69 (*)    Calcium 8.7 (*)    GFR calc non Af Amer 30 (*)    GFR calc Af Amer 35 (*)    All other components within normal limits  CBC - Abnormal; Notable for the following components:   WBC 17.5 (*)    RBC 1.34 (*)    Hemoglobin 3.1 (*)    HCT 11.6 (*)    MCH 23.1 (*)    MCHC 26.7 (*)    RDW 19.4 (*)    Platelets 417 (*)    nRBC 0.7 (*)    All other components within normal limits  POC OCCULT BLOOD, ED - Abnormal; Notable for the  following components:   Fecal Occult Bld POSITIVE (*)    All other components within normal limits  RESPIRATORY PANEL BY RT PCR (FLU A&B, COVID)  TYPE AND SCREEN  PREPARE RBC (CROSSMATCH)  ABO/RH  TROPONIN I (HIGH SENSITIVITY)  TROPONIN I (HIGH SENSITIVITY)    EKG EKG Interpretation  Date/Time:  Tuesday June 05 2020 11:09:49 EDT Ventricular Rate:  96 PR Interval:  158 QRS Duration: 70 QT Interval:  348 QTC Calculation: 439 R Axis:   84 Text Interpretation: Normal sinus rhythm ST & T wave abnormality, consider inferior ischemia ST & T wave abnormality, consider anterolateral ischemia Abnormal ECG Since last tracing rate slower and now in NSR Otherwise no significant change Confirmed by Daleen Bo [phone]) on 06/05/2020 5:15:41 PM   Radiology DG Chest 2 View  Result Date: 06/05/2020 CLINICAL DATA:  Persistent shortness of breath EXAM: CHEST - 2 VIEW COMPARISON:  02/19/2020 FINDINGS: Cardiac shadow remains enlarged. Aortic calcifications are again seen. The lungs are hyperinflated. The left lung is clear. Persistent consolidation in the right lower lobe with associated effusion is noted. No new focal abnormality is seen. IMPRESSION: Persistent right lower lobe consolidation with associated effusion. The overall appearance is similar to that seen June of 2021. Electronically Signed   By: Inez Catalina M.D.   On: 06/05/2020 11:42    Procedures .Critical Care Performed by: Daleen Bo, MD Authorized by: Daleen Bo, MD   Critical care provider statement:    Critical care time (minutes):  35   Critical care start time:  06/05/2020 5:55 PM   Critical care end time:  06/05/2020 8:40 PM   Critical care time was exclusive of:  Separately billable procedures and treating other patients   Critical care was necessary to treat or prevent imminent or life-threatening deterioration of the following conditions:  Circulatory failure   Critical care was time spent personally by me on  the following activities:  Blood draw for specimens, development of treatment plan with patient or surrogate, discussions with consultants, evaluation of patient's response to treatment, examination of patient, obtaining history from patient or surrogate, ordering and performing treatments and interventions, ordering and review of laboratory studies, pulse oximetry, re-evaluation of patient's condition, review of old charts and ordering and review of radiographic studies   (including critical care time)  Medications Ordered in ED Medications  0.9 %  sodium chloride infusion (has no administration in time range)    ED Course  I have reviewed the triage vital signs and the nursing notes.  Pertinent labs & imaging results that were available during my care of the patient were reviewed by me and considered in my medical decision making (see chart for details).    MDM Rules/Calculators/A&P  Patient Vitals for the past 24 hrs:  BP Temp Temp src Pulse Resp SpO2 Height Weight  06/05/20 1915 -- -- -- 89 15 96 % -- --  06/05/20 1900 (!) 98/41 -- -- -- 12 -- -- --  06/05/20 1746 -- -- -- -- -- -- _0  (1.6 m) 56.2 kg  06/05/20 1745 -- -- -- 89 15 100 % -- --  06/05/20 1547 (!) 102/48 -- -- 82 16 98 % -- --  06/05/20 1318 (!) 100/51 (!) 97.5 F (36.4 C) Oral 88 20 100 % -- --  06/05/20 1111 (!) 109/43 (!) 97.5 F (36.4 C) -- 96 20 100 % -- --    8:40 PM Reevaluation with update and discussion. After initial assessment and treatment, an updated evaluation reveals she remains comfortable. Findingd discussed and questions answered. Daleen Bo   Medical Decision Making:  This patient is presenting for evaluation of shortness of breath, subacute, which does require a range of treatment options, and is a complaint that involves a high risk of morbidity and mortality. The differential diagnoses include congestive heart failure, worsening pleural effusion, blood loss,  metabolic instability. I decided to review old records, and in summary chronically ill elderly female with history of CHF, COPD, PAH,  and anticoagulation.  She is DNR.   I did not require additional historical information from anyone.  Clinical Laboratory Tests Ordered, included CBC, Metabolic panel and Type and screen. Review indicates hemoglobin low, white count high, RBC indices low, platelets elevated, occult blood testing positive, BUN elevated, creatinine elevated, calcium low, GFR low, glucose high.   Cardiac Monitor Tracing which shows normal sinus rhythm   Critical Interventions-clinical evaluation, laboratory testing, IV fluids, serial testing, observation reassessment.  After These Interventions, the Patient was reevaluated and was found to be significantly anemic, likely causing shortness of breath.  She appears to have blood loss from stool, likely ongoing/chronic, not requiring immediate resuscitation in the emergency department.  She is anticoagulated with Eliquis for paroxysmal atrial fibrillation.  CRITICAL CARE-yes Performed by: Daleen Bo  Nursing Notes Reviewed/ Care Coordinated Applicable Imaging Reviewed Interpretation of Laboratory Data incorporated into ED treatment   8:10 PM-paged gastroenterology to obtain consult for assistance with management.  Case discussed with Dr. Bryan Lemma, who will see the patient as a Optometrist, tomorrow.  He agrees that the patient does not require reversal of Eliquis, and can be treated symptomatically with blood transfusion, at this time.  8:35 PM  8:36 PM-Consult complete with hospitalist. Patient case explained and discussed. He agrees to admit patient for further evaluation and treatment. Call ended at 8:51 P.M.    Final Clinical Impression(s) / ED Diagnoses Final diagnoses:  Anemia, unspecified type  Shortness of breath  On continuous oral anticoagulation    Rx / DC Orders ED Discharge Orders    None        Daleen Bo, MD 06/05/20 2154

## 2020-06-05 NOTE — ED Triage Notes (Signed)
Pt arrives via [facility] from home with c/o sob that has been ongoing but is now worse, also endorses generalized weakness, pt was able to walk with a walker initially until weakness began, reports she cannot walk at all now. Decreased appetite/po intake and increased falls. A/ox4, resp e/u, nad. Hx of CHF. EMS VS 102/60, HR 70, rr 22, 100 on 4L O2 (wears at baseline).

## 2020-06-05 NOTE — ED Notes (Signed)
Pt displaying no signs or symtpoms of reactions , blood rate will be increased

## 2020-06-05 NOTE — H&P (Signed)
History and Physical    Linda Stevens PPI:[id number] DOB: 1946/07/23 DOA: 06/05/2020  PCP: [name], MD  Patient coming from: Home  I have personally briefly reviewed patient's old medical records in [place]  Chief Complaint: SOB  HPI: Linda Stevens is a 74 y.o. female with medical history significant of severe PAH, dCHF, HTN, recent PAF diagnosis just started on eliquis a couple months ago.  Pt presents to ED with c/o gradually worsening SOB over past several weeks time.  Dr. Haroldine Laws started pt back on daily PO prednisone to see if this would help a couple of weeks ago.  Unfortunately despite this symptoms have continued to progressively worsen.  She denies fever, chills, cough, nausea, vomiting, focal weakness or paresthesia.  She is not having any chest pain, palpitations, or back pain.   ED Course: HGB 3.1 down form 10.7 in July.  Creat 1.6 up from 1.2 baseline  CXR shows RLL consolidation with effusion, similar to June.  Trop neg x2.  Hemoccult positive.  No melena, no hematochezia.  Review of Systems: As per HPI, otherwise all review of systems negative.  Past Medical History:  Diagnosis Date  . Acute respiratory failure with hypoxia and hypercapnea 02/26/2014  . Allergic rhinitis due to pollen   . Allergy   . Arthritis   . Asthma   . CHF (congestive heart failure) ([place])   . COPD (chronic obstructive pulmonary disease) ([place])   . Diastolic dysfunction    a. 02/2014 Echo: EF 65-70%, Gr 1 DD, RVH;  b. 09/2015 Echo: EF 55%, Gr 1 DD.  [name] Emphysema of lung ([place])   . H/O seasonal allergies   . History of chicken pox   . History of tobacco abuse    a. Quit 2015.  [name] History of UTI   . Hypertension   . Hypertensive heart disease   . Lower extremity edema   . Pericardial effusion    a. 09/2015 Echo: Mod eff w/o tamponade.  . Right heart failure with reduced right ventricular function ([place])    a. 09/2015 Echo: EF 55%, Gr 1 DD, D shaped IV  septum, sev dil RV with mod reduced fxn, mod dil RA, RV-RA grad 72mHg, PASP 864mg, mod pericard eff w/o tamponade.  . Severe Pulmonary Hypertension    a. 09/2015 Echo: PASP 8760m.    Past Surgical History:  Procedure Laterality Date  . CARDIAC CATHETERIZATION N/A 10/08/2015   Procedure: Right/Left Heart Cath and Coronary Angiography;  Surgeon: DanJolaine ArtistD;  Location: [facility];  Service: Cardiovascular;  Laterality: N/A;  . CHOLECYSTECTOMY     Open  . RIGHT HEART CATH N/A 01/26/2017   Procedure: Right Heart Cath;  Surgeon: BenJolaine ArtistD;  Location: [facility];  Service: Cardiovascular;  Laterality: N/A;  . RIGHT HEART CATH N/A 08/12/2017   Procedure: RIGHT HEART CATH;  Surgeon: BenJolaine ArtistD;  Location: [facility];  Service: Cardiovascular;  Laterality: N/A;     reports that she quit smoking about 6 years ago. Her smoking use included cigarettes. She has a 40.00 pack-year smoking history. She has never used smokeless tobacco. She reports that she does not drink alcohol and does not use drugs.  Allergies  Allergen Reactions  . Amoxicillin-Pot Clavulanate     REACTION: gi upset Has patient had a PCN reaction causing immediate rash, facial/tongue/throat swelling, SOB or lightheadedness with hypotension: No Has patient had a PCN reaction causing  severe rash involving mucus membranes or skin necrosis: No Has patient had a PCN reaction that required hospitalization : No Has patient had a PCN reaction occurring within the last 10 years: No If all of the above answers are "NO", then may proceed with Cephalosporin use.   . Cefdinir     REACTION: gi  upset  . Moxifloxacin     REACTION: rash  . Singulair [Montelukast Sodium]     Itching     Family History  Problem Relation Age of Onset  . Glaucoma Brother   . Vascular Disease Father        Died of complications from surgery  . Heart attack Father   . Asthma Mother        Died of  asthma attack  . Dementia Mother   . Hyperlipidemia Mother   . Hypertension Mother      Prior to Admission medications   Medication Sig Start Date End Date Taking? Authorizing Provider  acetaminophen (TYLENOL) 325 MG tablet Take 325 mg by mouth at bedtime as needed for moderate pain or headache.    Yes [provider]  albuterol (PROVENTIL HFA;VENTOLIN HFA) 108 (90 Base) MCG/ACT inhaler Inhale 2 puffs every 6 (six) hours as needed into the lungs for wheezing or shortness of breath. 07/17/17  Yes Tanda Rockers, MD  ambrisentan (LETAIRIS) 10 MG tablet TAKE 1 TABLET BY MOUTH DAILY. DO NOT HANDLE IF PREGNANT. DO NOT SPLIT, CRUSH, OR CHEW. AVOID INHALATION AND CONTACT WITH SKIN OR EYES . CALL [phone] TO REFILL. Patient taking differently: Take 10 mg by mouth daily.  04/09/20  Yes Bensimhon, Shaune Pascal, MD  amiodarone (PACERONE) 100 MG tablet Take 1 tablet (100 mg total) by mouth daily. 04/09/20  Yes Bensimhon, Shaune Pascal, MD  apixaban (ELIQUIS) 5 MG TABS tablet Take 1 tablet (5 mg total) by mouth 2 (two) times daily. 04/16/20  Yes Bensimhon, Shaune Pascal, MD  aspirin EC 81 MG EC tablet Take 1 tablet (81 mg total) by mouth daily. 10/09/15  Yes Debbe Odea, MD  colchicine 0.6 MG tablet TAKE 1/2 A TABLET BY MOUTH TWICE DAILY Patient taking differently: Take 0.3 mg by mouth 2 (two) times daily.  05/29/20  Yes Bedsole, Amy E, MD  ELIQUIS 5 MG TABS tablet TAKE 1 TABLET BY MOUTH TWICE A DAY Patient taking differently: Take 5 mg by mouth 2 (two) times daily.  04/26/20  Yes Bedsole, Amy E, MD  furosemide (LASIX) 40 MG tablet Take 1 tablet (40 mg total) by mouth daily. 03/11/20  Yes Hosie Poisson, MD  pantoprazole (PROTONIX) 40 MG tablet TAKE 1 TABLET BY MOUTH EVERY DAY Patient taking differently: Take 40 mg by mouth daily.  04/10/20  Yes Bedsole, Amy E, MD  potassium chloride SA (KLOR-CON) 20 MEQ tablet Take 1 tablet (20 mEq total) by mouth daily. 03/27/20  Yes Bedsole, Amy E, MD  predniSONE (DELTASONE) 20 MG  tablet TAKE 1 TABLET BY MOUTH EVERY DAY 05/22/20  Yes Bensimhon, Shaune Pascal, MD  spironolactone (ALDACTONE) 25 MG tablet Take 12.5 mg by mouth daily.   Yes [provider]  tadalafil, PAH, (ADCIRCA) 20 MG tablet TAKE 2 TABLETS DAILY Generic for Adcirca Patient taking differently: Take 40 mg by mouth daily.  04/10/20  Yes Bensimhon, Shaune Pascal, MD  midodrine (PROAMATINE) 2.5 MG tablet Take 1 tablet (2.5 mg total) by mouth 3 (three) times daily as needed (SBP<100). Patient not taking: Reported on 06/05/2020 04/26/20   [name], MD  OXYGEN 2 to 2lpm 24/7    [provider]    Physical Exam: Vitals:   06/05/20 1745 06/05/20 1746 06/05/20 1900 06/05/20 1915  BP:   (!) 98/41   Pulse: 89   89  Resp: _0 Temp:      TempSrc:      SpO2: 100%   96%  Weight:  56.2 kg    Height:  _1  (1.6 m)      Constitutional: NAD, calm, comfortable Eyes: PERRL, lids and conjunctivae normal ENMT: Mucous membranes are moist. Posterior pharynx clear of any exudate or lesions.Normal dentition.  Neck: normal, supple, no masses, no thyromegaly Respiratory: clear to auscultation bilaterally, no wheezing, no crackles. Normal respiratory effort. No accessory muscle use.  Cardiovascular: Regular rate and rhythm, no murmurs / rubs / gallops. No extremity edema. 2+ pedal pulses. No carotid bruits.  Abdomen: no tenderness, no masses palpated. No hepatosplenomegaly. Bowel sounds positive.  Musculoskeletal: no clubbing / cyanosis. No joint deformity upper and lower extremities. Good ROM, no contractures. Normal muscle tone.  Skin: no rashes, lesions, ulcers. No induration Neurologic: CN 2-12 grossly intact. Sensation intact, DTR normal. Strength 5/5 in all 4.  Psychiatric: Normal judgment and insight. Alert and oriented x 3. Normal mood.    Labs on Admission: I have personally reviewed following labs and imaging studies  CBC: Recent Labs  Lab 06/05/20 1744  WBC 17.5*  HGB 3.1*  HCT 11.6*    MCV 86.6  PLT 409*   Basic Metabolic Panel: Recent Labs  Lab 06/05/20 1142  NA 139  K 4.2  CL 99  CO2 29  GLUCOSE 119*  BUN 27*  CREATININE 1.69*  CALCIUM 8.7*   GFR: Estimated Creatinine Clearance: 24.9 mL/min (A) (by C-G formula based on SCr of 1.69 mg/dL (H)). Liver Function Tests: No results for input(s): AST, ALT, ALKPHOS, BILITOT, PROT, ALBUMIN in the last 168 hours. No results for input(s): LIPASE, AMYLASE in the last 168 hours. No results for input(s): AMMONIA in the last 168 hours. Coagulation Profile: No results for input(s): INR, PROTIME in the last 168 hours. Cardiac Enzymes: No results for input(s): CKTOTAL, CKMB, CKMBINDEX, TROPONINI in the last 168 hours. BNP (last 3 results) No results for input(s): PROBNP in the last 8760 hours. HbA1C: No results for input(s): HGBA1C in the last 72 hours. CBG: No results for input(s): GLUCAP in the last 168 hours. Lipid Profile: No results for input(s): CHOL, HDL, LDLCALC, TRIG, CHOLHDL, LDLDIRECT in the last 72 hours. Thyroid Function Tests: No results for input(s): TSH, T4TOTAL, FREET4, T3FREE, THYROIDAB in the last 72 hours. Anemia Panel: No results for input(s): VITAMINB12, FOLATE, FERRITIN, TIBC, IRON, RETICCTPCT in the last 72 hours. Urine analysis:    Component Value Date/Time   COLORURINE YELLOW 02/23/2020 1319   APPEARANCEUR HAZY (A) 02/23/2020 1319   LABSPEC 1.019 02/23/2020 1319   PHURINE 5.0 02/23/2020 1319   GLUCOSEU NEGATIVE 02/23/2020 1319   GLUCOSEU NEGATIVE 07/07/2017 1625   HGBUR NEGATIVE 02/23/2020 1319   HGBUR negative 12/22/2007 1139   BILIRUBINUR NEGATIVE 02/23/2020 1319   KETONESUR NEGATIVE 02/23/2020 1319   PROTEINUR NEGATIVE 02/23/2020 1319   UROBILINOGEN 0.2 07/07/2017 1625   NITRITE NEGATIVE 02/23/2020 1319   LEUKOCYTESUR NEGATIVE 02/23/2020 1319    Radiological Exams on Admission: DG Chest 2 View  Result Date: 06/05/2020 CLINICAL DATA:  Persistent shortness of breath EXAM:  CHEST - 2 VIEW COMPARISON:  02/19/2020 FINDINGS: Cardiac shadow remains enlarged. Aortic calcifications are again  seen. The lungs are hyperinflated. The left lung is clear. Persistent consolidation in the right lower lobe with associated effusion is noted. No new focal abnormality is seen. IMPRESSION: Persistent right lower lobe consolidation with associated effusion. The overall appearance is similar to that seen June of 2021. Electronically Signed   By: Inez Catalina M.D.   On: 06/05/2020 11:42    EKG: Independently reviewed.  Assessment/Plan Principal Problem:   Symptomatic anemia Active Problems:   HTN (hypertension)   Chronic diastolic CHF (congestive heart failure) (H[facility])   COPD GOLD II criteria but 02 dep 24/7    Severe Pulmonary Hypertension   PAF (paroxysmal atrial fibrillation) ([facility])   GI bleed   Acute blood loss anemia   AKI (acute kidney injury) ([place])    1. Symptomatic anemia, probably subacute blood loss due to GIB - 1. Stop eliquis 2. Clear liquid diet, NPO after MN 3. GI to see in consult in AM 4. Transfusing 2u PRBC now slowly 5. Repeat CBC in AM after transfusions 6. Suspect she will need diuresis after that, then further transfusions 2. AKI - 1. Presumably secondary to above 2. Holding lasix / aldactone 3. Strict intake and output 4. Repeat BMP in AM 3. dCHF - 1. Holding home lasix and aldactone, plan to give IV lasix after transfusions before further transfusions. 4. PAH - 1. Cont taldalafil and ambrisentan 5. PAF - 1. Cont amiodarone 2. Stop Eliquis 6. Baseline intermittent hypotension - 1. Takes PRN midodrine it seems 7. Recently started on steroids - 1. Pt recently started on daily prednisone by Dr. Haroldine Laws "a couple of weeks ago" due to worsening SOB (obviously not aware at that time of pt's anemia). 2. Will leave pt on her current prednisone 69m PO daily for the moment while addressing GIB and anemia 3. No tachycardia, abd pain, dont think adrenal  crisis at the moment. 4. Contact Dr. BHaroldine Lawsin AM, he may want to taper off of the steroids.  DVT prophylaxis: SCDs Code Status: DNR - confirmed with patient Family Communication: No family in room Disposition Plan: Home after admit Consults called: GI to see in AM Admission status: Admit to inpatient  Severity of Illness: The appropriate patient status for this patient is INPATIENT. Inpatient status is judged to be reasonable and necessary in order to provide the required intensity of service to ensure the patient's safety. The patient's presenting symptoms, physical exam findings, and initial radiographic and laboratory data in the context of their chronic comorbidities is felt to place them at high risk for further clinical deterioration. Furthermore, it is not anticipated that the patient will be medically stable for discharge from the hospital within 2 midnights of admission. The following factors support the patient status of inpatient.   IP status due to GIB with HGB of 3.1 requiring blood transfusions.  * I certify that at the point of admission it is my clinical judgment that the patient will require inpatient hospital care spanning beyond 2 midnights from the point of admission due to high intensity of service, high risk for further deterioration and high frequency of surveillance required.*    Linda Stevens M. DO [facility]  How to contact the [facility] CenterAttending or Consulting provider [place] covering provider during after hours [place] for this patient?  1. Check the care team in [facility] look for a) attending/consulting [facility] provider listed and b) the [facility]team listed 2. Log into [email or web address]  Amion Physician Scheduling and messaging for  groups and whole hospitals  On call and physician scheduling software for group practices, residents, hospitalists and other medical providers for call, clinic, rotation and shift schedules. On[employer] is a hospital-wide system  for scheduling doctors and paging doctors on call. EasyPlot is for scientific plotting and data analysis.  [email or web address]  and use [facility]'s universal password to access. If you do not have the password, please contact the hospital operator.  3. Locate the [facility] provider you are looking for under [facility] and page to a number that you can be directly reached. 4. If you still have difficulty reaching the provider, please page the [facility] (Director on Call) for the Hospitalists listed on amion for assistance.  06/05/2020, 9:25 PM

## 2020-06-06 ENCOUNTER — Encounter (HOSPITAL_COMMUNITY): Payer: Self-pay | Admitting: Internal Medicine

## 2020-06-06 DIAGNOSIS — D649 Anemia, unspecified: Secondary | ICD-10-CM

## 2020-06-06 DIAGNOSIS — D62 Acute posthemorrhagic anemia: Secondary | ICD-10-CM

## 2020-06-06 LAB — PREPARE RBC (CROSSMATCH)

## 2020-06-06 LAB — IRON AND TIBC
Iron: 161 ug/dL (ref 28–170)
Saturation Ratios: 35 % — ABNORMAL HIGH (ref 10.4–31.8)
TIBC: 455 ug/dL — ABNORMAL HIGH (ref 250–450)
UIBC: 294 ug/dL

## 2020-06-06 LAB — BASIC METABOLIC PANEL
Anion gap: 9 (ref 5–15)
BUN: 30 mg/dL — ABNORMAL HIGH (ref 8–23)
CO2: 30 mmol/L (ref 22–32)
Calcium: 7.9 mg/dL — ABNORMAL LOW (ref 8.9–10.3)
Chloride: 99 mmol/L (ref 98–111)
Creatinine, Ser: 1.8 mg/dL — ABNORMAL HIGH (ref 0.44–1.00)
GFR calc Af Amer: 32 mL/min — ABNORMAL LOW (ref >60)
GFR calc non Af Amer: 28 mL/min — ABNORMAL LOW (ref >60)
Glucose, Bld: 86 mg/dL (ref 70–99)
Potassium: 3.9 mmol/L (ref 3.5–5.1)
Sodium: 138 mmol/L (ref 135–145)

## 2020-06-06 LAB — CBC
HCT: 19.8 % — ABNORMAL LOW (ref 36.0–46.0)
Hemoglobin: 6 g/dL — CL (ref 12.0–15.0)
MCH: 26.7 pg (ref 26.0–34.0)
MCHC: 30.3 g/dL (ref 30.0–36.0)
MCV: 88 fL (ref 80.0–100.0)
Platelets: 229 10*3/uL (ref 150–400)
RBC: 2.25 MIL/uL — ABNORMAL LOW (ref 3.87–5.11)
RDW: 16.9 % — ABNORMAL HIGH (ref 11.5–15.5)
WBC: 8 10*3/uL (ref 4.0–10.5)
nRBC: 1 % — ABNORMAL HIGH (ref 0.0–0.2)

## 2020-06-06 LAB — RETICULOCYTES
Immature Retic Fract: 16.2 % — ABNORMAL HIGH (ref 2.3–15.9)
RBC.: 3.17 MIL/uL — ABNORMAL LOW (ref 3.87–5.11)
Retic Count, Absolute: 53.3 10*3/uL (ref 19.0–186.0)
Retic Ct Pct: 1.7 % (ref 0.4–3.1)

## 2020-06-06 LAB — VITAMIN B12: Vitamin B-12: 159 pg/mL — ABNORMAL LOW (ref 180–914)

## 2020-06-06 LAB — FERRITIN: Ferritin: 29 ng/mL (ref 11–307)

## 2020-06-06 LAB — FOLATE: Folate: 10.1 ng/mL (ref >5.9)

## 2020-06-06 MED ORDER — FUROSEMIDE 10 MG/ML IJ SOLN
40.0000 mg | Freq: Once | INTRAMUSCULAR | Status: AC
  Administered 2020-06-06: 40 mg via INTRAVENOUS
  Filled 2020-06-06: qty 4

## 2020-06-06 MED ORDER — SODIUM CHLORIDE 0.9% IV SOLUTION: Freq: Once | INTRAVENOUS | Status: DC

## 2020-06-06 MED ORDER — PEG-KCL-NACL-NASULF-NA ASC-C 100 G PO SOLR: 1.0000 | Freq: Once | ORAL | Status: DC

## 2020-06-06 MED ORDER — PEG 3350-KCL-NABCB-NACL-NASULF 236 G PO SOLR
2000.0000 mL | Freq: Once | ORAL | Status: AC
  Administered 2020-06-06: 2000 mL via ORAL
  Filled 2020-06-06 (×2): qty 2000

## 2020-06-06 MED ORDER — PREDNISONE 10 MG PO TABS
10.0000 mg | ORAL_TABLET | Freq: Every day | ORAL | Status: AC
  Administered 2020-06-07: 10 mg via ORAL
  Filled 2020-06-06: qty 1

## 2020-06-06 MED ORDER — SODIUM CHLORIDE 0.9 % IV SOLN: INTRAVENOUS | Status: DC

## 2020-06-06 MED ORDER — PEG 3350-KCL-NABCB-NACL-NASULF 236 G PO SOLR
2000.0000 mL | Freq: Once | ORAL | Status: AC
  Administered 2020-06-07: 2000 mL via ORAL
  Filled 2020-06-06 (×2): qty 2000

## 2020-06-06 NOTE — Anesthesia Preprocedure Evaluation (Addendum)
Anesthesia Evaluation  Patient identified by MRN, date of birth, ID band Patient awake    Reviewed: Allergy & Precautions, NPO status , Patient's Chart, lab work & pertinent test results, reviewed documented beta blocker date and time   Airway Mallampati: II  TM Distance: >3 FB Neck ROM: Full    Dental  (+) Missing, Caps, Dental Advisory Given   Pulmonary asthma , pneumonia, resolved, COPD,  COPD inhaler and oxygen dependent, former smoker,    Pulmonary exam normal breath sounds clear to auscultation       Cardiovascular hypertension, Pt. on medications +CHF  Normal cardiovascular exam Rhythm:Regular Rate:Normal  Severe pulmonary HTN  EKG 06/06/20 NSR, ST-T wave changes c/w anterolateral ishemia  Echo 02/29/20 1. Left ventricular ejection fraction, by estimation, is 65 to 70%. The left ventricle has normal function. The left ventricle has no regional wall motion abnormalities. Left ventricular diastolic function could not be evaluated.  2. Right ventricular systolic function is normal. The right ventricular size is normal. There is moderately elevated pulmonary artery systolic pressure. The estimated right ventricular systolic pressure is 23.5 mmHg.  3. Small circumferential pericardial effusion. There is no evidence of cardiac tamponade. The epicardial layer appears diffusely thickened, consistent with inflammatory pericardial disease.  4. The mitral valve is normal in structure.  5. The aortic valve is normal in structure.  6. The inferior vena cava is dilated in size with <50% respiratory variability, suggesting right atrial pressure of 15 mmHg.  Right heart cath 08/12/17 1. Mild PAH with mildly elevated PVR 2. High-output state without evidence of intracardiac shunting    Neuro/Psych negative neurological ROS  negative psych ROS   GI/Hepatic (+) Cirrhosis       , Heme + stool   Endo/Other  negative endocrine  ROS  Renal/GU Renal disease  negative genitourinary   Musculoskeletal  (+) Arthritis , Osteoarthritis,    Abdominal   Peds  Hematology  (+) anemia ,   Anesthesia Other Findings   Reproductive/Obstetrics                            Anesthesia Physical Anesthesia Plan  ASA: III  Anesthesia Plan: MAC   Post-op Pain Management:    Induction: Intravenous  PONV Risk Score and Plan: 2 and Propofol infusion  Airway Management Planned: Natural Airway and Nasal Cannula  Additional Equipment:   Intra-op Plan:   Post-operative Plan:   Informed Consent: I have reviewed the patients History and Physical, chart, labs and discussed the procedure including the risks, benefits and alternatives for the proposed anesthesia with the patient or authorized representative who has indicated his/her understanding and acceptance.     Dental advisory given  Plan Discussed with: CRNA and Anesthesiologist  Anesthesia Plan Comments:        Anesthesia Quick Evaluation

## 2020-06-06 NOTE — H&P (View-Only) (Signed)
Referring Provider:  ? Primary Care Physician:  [name], MD Primary Gastroenterologist:  Althia Forts  Reason for Consultation:  Anemia and heme positive stool  HPI: Linda Stevens is a 74 y.o. female with medical history significant of severe PAH on O2, dCHF, HTN, recent PAF diagnosis just started on eliquis a couple months ago.  She presented to the ED with c/o gradually worsening SOB over past several weeks time.  Dr. Haroldine Laws started pt back on daily PO prednisone on 9/14 to see if that would help.  Unfortunately symptoms have continued to progressively worsen.  Unable to get up to walker, etc.  In the ED she was found to have a HGB 3.1 grams down from 10.7 grams in July.  Hemoccult positive.  She denies any dark or bloody stools.  Says that her stools are always kind of loose, which she attributes to one of her medications, but the stool has been brown.  She denies abdominal pain.  Has never had an EGD or colonoscopy in the past.  No dysphagia or weight loss.  Past Medical History:  Diagnosis Date  . Acute respiratory failure with hypoxia and hypercapnea 02/26/2014  . Allergic rhinitis due to pollen   . Allergy   . Arthritis   . Asthma   . CHF (congestive heart failure) ([place])   . COPD (chronic obstructive pulmonary disease) ([place])   . Diastolic dysfunction    a. 02/2014 Echo: EF 65-70%, Gr 1 DD, RVH;  b. 09/2015 Echo: EF 55%, Gr 1 DD.  [name] Emphysema of lung ([place])   . H/O seasonal allergies   . History of chicken pox   . History of tobacco abuse    a. Quit 2015.  [name] History of UTI   . Hypertension   . Hypertensive heart disease   . Lower extremity edema   . Pericardial effusion    a. 09/2015 Echo: Mod eff w/o tamponade.  . Right heart failure with reduced right ventricular function ([place])    a. 09/2015 Echo: EF 55%, Gr 1 DD, D shaped IV septum, sev dil RV with mod reduced fxn, mod dil RA, RV-RA grad 50mHg, PASP 853mg, mod pericard eff w/o tamponade.  . Severe Pulmonary  Hypertension    a. 09/2015 Echo: PASP 873m.    Past Surgical History:  Procedure Laterality Date  . CARDIAC CATHETERIZATION N/A 10/08/2015   Procedure: Right/Left Heart Cath and Coronary Angiography;  Surgeon: DanJolaine ArtistD;  Location: [facility];  Service: Cardiovascular;  Laterality: N/A;  . CHOLECYSTECTOMY     Open  . RIGHT HEART CATH N/A 01/26/2017   Procedure: Right Heart Cath;  Surgeon: BenJolaine ArtistD;  Location: [facility];  Service: Cardiovascular;  Laterality: N/A;  . RIGHT HEART CATH N/A 08/12/2017   Procedure: RIGHT HEART CATH;  Surgeon: BenJolaine ArtistD;  Location: [facility];  Service: Cardiovascular;  Laterality: N/A;    Prior to Admission medications   Medication Sig Start Date End Date Taking? Authorizing Provider  acetaminophen (TYLENOL) 325 MG tablet Take 325 mg by mouth at bedtime as needed for moderate pain or headache.    Yes [provider]  albuterol (PROVENTIL HFA;VENTOLIN HFA) 108 (90 Base) MCG/ACT inhaler Inhale 2 puffs every 6 (six) hours as needed into the lungs for wheezing or shortness of breath. 07/17/17  Yes WerTanda RockersD  ambrisentan (LETAIRIS) 10 MG tablet TAKE 1 TABLET BY MOUTH DAILY. DO NOT HANDLE  IF PREGNANT. DO NOT SPLIT, CRUSH, OR CHEW. AVOID INHALATION AND CONTACT WITH SKIN OR EYES . CALL [phone] TO REFILL. Patient taking differently: Take 10 mg by mouth daily.  04/09/20  Yes Bensimhon, Shaune Pascal, MD  amiodarone (PACERONE) 100 MG tablet Take 1 tablet (100 mg total) by mouth daily. 04/09/20  Yes Bensimhon, Shaune Pascal, MD  apixaban (ELIQUIS) 5 MG TABS tablet Take 1 tablet (5 mg total) by mouth 2 (two) times daily. 04/16/20  Yes Bensimhon, Shaune Pascal, MD  aspirin EC 81 MG EC tablet Take 1 tablet (81 mg total) by mouth daily. 10/09/15  Yes Debbe Odea, MD  colchicine 0.6 MG tablet TAKE 1/2 A TABLET BY MOUTH TWICE DAILY Patient taking differently: Take 0.3 mg by mouth 2 (two) times daily.  05/29/20   Yes Bedsole, Amy E, MD  ELIQUIS 5 MG TABS tablet TAKE 1 TABLET BY MOUTH TWICE A DAY Patient taking differently: Take 5 mg by mouth 2 (two) times daily.  04/26/20  Yes Bedsole, Amy E, MD  furosemide (LASIX) 40 MG tablet Take 1 tablet (40 mg total) by mouth daily. 03/11/20  Yes Hosie Poisson, MD  pantoprazole (PROTONIX) 40 MG tablet TAKE 1 TABLET BY MOUTH EVERY DAY Patient taking differently: Take 40 mg by mouth daily.  04/10/20  Yes Bedsole, Amy E, MD  potassium chloride SA (KLOR-CON) 20 MEQ tablet Take 1 tablet (20 mEq total) by mouth daily. 03/27/20  Yes Bedsole, Amy E, MD  predniSONE (DELTASONE) 20 MG tablet TAKE 1 TABLET BY MOUTH EVERY DAY 05/22/20  Yes Bensimhon, Shaune Pascal, MD  spironolactone (ALDACTONE) 25 MG tablet Take 12.5 mg by mouth daily.   Yes [provider]  tadalafil, PAH, (ADCIRCA) 20 MG tablet TAKE 2 TABLETS DAILY Generic for Adcirca Patient taking differently: Take 40 mg by mouth daily.  04/10/20  Yes Bensimhon, Shaune Pascal, MD  midodrine (PROAMATINE) 2.5 MG tablet Take 1 tablet (2.5 mg total) by mouth 3 (three) times daily as needed (SBP<100). Patient not taking: Reported on 06/05/2020 04/26/20   [name], MD  OXYGEN 2 to 2lpm 24/7    [provider]    Current Facility-Administered Medications  Medication Dose Route Frequency Provider Last Rate Last Admin  . acetaminophen (TYLENOL) tablet 650 mg  650 mg Oral Q6H PRN Etta Quill, DO       Or  . acetaminophen (TYLENOL) suppository 650 mg  650 mg Rectal Q6H PRN Etta Quill, DO      . albuterol (VENTOLIN HFA) 108 (90 Base) MCG/ACT inhaler 2 puff  2 puff Inhalation Q6H PRN Etta Quill, DO      . ambrisentan (LETAIRIS) tablet 10 mg  10 mg Oral Daily Jennette Kettle M, DO      . amiodarone (PACERONE) tablet 100 mg  100 mg Oral Daily Jennette Kettle M, DO   100 mg at 06/05/20 2131  . ondansetron (ZOFRAN) tablet 4 mg  4 mg Oral Q6H PRN Etta Quill, DO       Or  . ondansetron [facility]) injection 4 mg  4  mg Intravenous Q6H PRN Etta Quill, DO      . pantoprazole (PROTONIX) EC tablet 40 mg  40 mg Oral Daily Alcario Drought, Jared M, DO      . predniSONE (DELTASONE) tablet 20 mg  20 mg Oral Daily Alcario Drought, Jared M, DO      . tadalafil (PAH) (ADCIRCA) tablet 40 mg  40 mg Oral Daily Etta Quill, DO  Current Outpatient Medications  Medication Sig Dispense Refill  . acetaminophen (TYLENOL) 325 MG tablet Take 325 mg by mouth at bedtime as needed for moderate pain or headache.     . albuterol (PROVENTIL HFA;VENTOLIN HFA) 108 (90 Base) MCG/ACT inhaler Inhale 2 puffs every 6 (six) hours as needed into the lungs for wheezing or shortness of breath. 1 Inhaler 11  . ambrisentan (LETAIRIS) 10 MG tablet TAKE 1 TABLET BY MOUTH DAILY. DO NOT HANDLE IF PREGNANT. DO NOT SPLIT, CRUSH, OR CHEW. AVOID INHALATION AND CONTACT WITH SKIN OR EYES . CALL [phone] TO REFILL. (Patient taking differently: Take 10 mg by mouth daily. ) 90 tablet 3  . amiodarone (PACERONE) 100 MG tablet Take 1 tablet (100 mg total) by mouth daily. 30 tablet 6  . apixaban (ELIQUIS) 5 MG TABS tablet Take 1 tablet (5 mg total) by mouth 2 (two) times daily. 60 tablet 3  . aspirin EC 81 MG EC tablet Take 1 tablet (81 mg total) by mouth daily. 30 tablet 0  . colchicine 0.6 MG tablet TAKE 1/2 A TABLET BY MOUTH TWICE DAILY (Patient taking differently: Take 0.3 mg by mouth 2 (two) times daily. ) 30 tablet 1  . ELIQUIS 5 MG TABS tablet TAKE 1 TABLET BY MOUTH TWICE A DAY (Patient taking differently: Take 5 mg by mouth 2 (two) times daily. ) 60 tablet 2  . furosemide (LASIX) 40 MG tablet Take 1 tablet (40 mg total) by mouth daily. 30 tablet 1  . pantoprazole (PROTONIX) 40 MG tablet TAKE 1 TABLET BY MOUTH EVERY DAY (Patient taking differently: Take 40 mg by mouth daily. ) 90 tablet 3  . potassium chloride SA (KLOR-CON) 20 MEQ tablet Take 1 tablet (20 mEq total) by mouth daily. 90 tablet 1  . predniSONE (DELTASONE) 20 MG tablet TAKE 1 TABLET BY MOUTH  EVERY DAY 30 tablet 0  . spironolactone (ALDACTONE) 25 MG tablet Take 12.5 mg by mouth daily.    . tadalafil, PAH, (ADCIRCA) 20 MG tablet TAKE 2 TABLETS DAILY Generic for Adcirca (Patient taking differently: Take 40 mg by mouth daily. ) 180 tablet 3  . midodrine (PROAMATINE) 2.5 MG tablet Take 1 tablet (2.5 mg total) by mouth 3 (three) times daily as needed (SBP<100). (Patient not taking: Reported on 06/05/2020) 30 tablet 2  . OXYGEN 2 to 2lpm 24/7      Allergies as of 06/05/2020 - Review Complete 06/05/2020  Allergen Reaction Noted  . Amoxicillin-pot clavulanate    . Cefdinir    . Moxifloxacin  08/21/2010  . Singulair [montelukast sodium]  07/10/2015    Family History  Problem Relation Age of Onset  . Glaucoma Brother   . Vascular Disease Father        Died of complications from surgery  . Heart attack Father   . Asthma Mother        Died of asthma attack  . Dementia Mother   . Hyperlipidemia Mother   . Hypertension Mother     Social History   Socioeconomic History  . Marital status: Married    Spouse name: Not on file  . Number of children: Not on file  . Years of education: 2  . Highest education level: Not on file  Occupational History  . Occupation: retired Pharmacist, hospital  Tobacco Use  . Smoking status: Former Smoker    Packs/day: 1.00    Years: 40.00    Pack years: 40.00    Types: Cigarettes    Quit date: 02/22/2014  Years since quitting: 6.2  . Smokeless tobacco: Never Used  Vaping Use  . Vaping Use: Never used  Substance and Sexual Activity  . Alcohol use: No  . Drug use: No  . Sexual activity: Never  Other Topics Concern  . Not on file  Social History Narrative   Married with 2 children.  Independent of ADLs.      Does not have a living will.   Would desire CPR but would not want prolonged life support if futile- husband aware.   Social Determinants of Health   Financial Resource Strain:   . Difficulty of Paying Living Expenses: Not on file  Food  Insecurity:   . Worried About Charity fundraiser in the Last Year: Not on file  . Ran Out of Food in the Last Year: Not on file  Transportation Needs:   . Lack of Transportation (Medical): Not on file  . Lack of Transportation (Non-Medical): Not on file  Physical Activity:   . Days of Exercise per Week: Not on file  . Minutes of Exercise per Session: Not on file  Stress:   . Feeling of Stress : Not on file  Social Connections:   . Frequency of Communication with Friends and Family: Not on file  . Frequency of Social Gatherings with Friends and Family: Not on file  . Attends Religious Services: Not on file  . Active Member of Clubs or Organizations: Not on file  . Attends Archivist Meetings: Not on file  . Marital Status: Not on file  Intimate Partner Violence:   . Fear of Current or Ex-Partner: Not on file  . Emotionally Abused: Not on file  . Physically Abused: Not on file  . Sexually Abused: Not on file    Review of Systems: ROS is O/W negative except as mentioned in HPI.  Physical Exam: Vital signs in last 24 hours: Temp:  [97.5 F (36.4 C)-98.7 F (37.1 C)] 98.7 F (37.1 C) (09/29 0530) Pulse Rate:  [66-96] 69 (09/29 0645) Resp:  [12-20] 16 (09/29 0530) BP: (93-113)/(41-58) 104/46 (09/29 0645) SpO2:  [96 %-100 %] 100 % (09/29 0645) Weight:  [56.2 kg] 56.2 kg (09/28 1746)   General:  Alert, chronically ill-appearing, pleasant and cooperative in NAD; appears pale Head:  Normocephalic and atraumatic. Eyes:  Sclera clear, no icterus.  Conjunctiva pale. Ears:  Normal auditory acuity. Mouth:  No deformity or lesions.   Lungs:  Clear throughout to auscultation.  No wheezes, crackles, or rhonchi.  Heart:  Regular rate and rhythm; no murmurs, clicks, rubs, or gallops. Abdomen:  Soft, non-distended.  BS present.  Non-tender. Rectal:  Deferred.  Heme positive by EDP.  Msk:  Symmetrical without gross deformities. Pulses:  Normal pulses noted. Extremities:   Without clubbing or edema. Neurologic:  Alert and oriented x 4;  grossly normal neurologically. Skin:  Intact without significant lesions or rashes. Psych:  Alert and cooperative. Normal mood and affect.  Intake/Output from previous day: 09/28 0701 - 09/29 0700 In: 861 [Blood:861] Out: -   Lab Results: Recent Labs    06/05/20 1744 06/06/20 0605  WBC 17.5* 8.0  HGB 3.1* 6.0*  HCT 11.6* 19.8*  PLT 417* 229   BMET Recent Labs    06/05/20 1142 06/06/20 0605  NA 139 138  K 4.2 3.9  CL 99 99  CO2 29 30  GLUCOSE 119* 86  BUN 27* 30*  CREATININE 1.69* 1.80*  CALCIUM 8.7* 7.9*   Studies/Results: DG Chest  2 View  Result Date: 06/05/2020 CLINICAL DATA:  Persistent shortness of breath EXAM: CHEST - 2 VIEW COMPARISON:  02/19/2020 FINDINGS: Cardiac shadow remains enlarged. Aortic calcifications are again seen. The lungs are hyperinflated. The left lung is clear. Persistent consolidation in the right lower lobe with associated effusion is noted. No new focal abnormality is seen. IMPRESSION: Persistent right lower lobe consolidation with associated effusion. The overall appearance is similar to that seen June of 2021. Electronically Signed   By: Inez Catalina M.D.   On: 06/05/2020 11:42   IMPRESSION:  *Severe symptomatic anemia with Hgb 3.1 grams down from 10.7 grams just two months ago.  Received 2 units and is up to 6.0 grams this AM.  FOBT positive, but no sign of overt GI bleeding.  Likely subacute bleed.  Never had EGD or colonoscopy in the past.  ? Ulcer vs AVM vs malignancy, etc. *Atrial fibrillation, on Eliquis:  On hold. *Severe PAH on O2 *Recently placed on steroids, prednisone 20 mg  PLAN: -Likely will need EGD and colonoscopy to clear GI tract before restarting Eliquis.   -Can have clear liquids for now. -Needs further resuscitated with PRBCs to bring Hgb to a safer level.  Laban Emperor. Zehr  06/06/2020, 9:09  AM    ________________________________________________________________________  Velora Heckler GI MD note:  I personally examined the patient, reviewed the data and agree with the assessment and plan described above.  74 yo woman with severe pulm hypertension on home oxygen, CHF, HTN, started on eliquis 2-3 months ago for new paroxysmal Afib.  Now present with Hb 3.1 (from 10.7 3 months ago), heme + stool any NO overt bleeding. She's never had GI testing with colonoscopy or EGD.  No FH of colon cancer. She is getting her third unit of blood out of 4, she is HD stable. Should be able to prep for GI testing (colonoscopy and EGD) tomorrow AM. Last eliquis dose yesterday.    Owens Loffler, MD [facility] Pager [phone]

## 2020-06-06 NOTE — ED Notes (Signed)
First unit of blood complete.

## 2020-06-06 NOTE — ED Notes (Signed)
Blood transfusion complete. Will wait 30 minutes and then draw am labs.

## 2020-06-06 NOTE — ED Notes (Signed)
GI PA at bedside.

## 2020-06-06 NOTE — Progress Notes (Signed)
PROGRESS NOTE    Linda Stevens  MAU:[id number] DOB: 10-06-1945 DOA: 06/05/2020 PCP: [name], MD  Brief Narrative: Frail 74 year old female with history of chronic diastolic CHF, severe pulmonary hypertension, chronic respiratory failure on 2 L home O2, hypertension, recent diagnosis of paroxysmal atrial fibrillation started on Eliquis a few months ago.  Presented to the emergency room with worsening dyspnea on exertion -She was recently started on prednisone to help with her dyspnea -Work-up in the emergency room noted hemoglobin of 3.1 down from 10.7 in July, Hemoccult was positive, patient denied any overt bleeding   Assessment & Plan:  Severe symptomatic anemia Suspect subacute blood loss, no overt bleeding noted, admitted with hemoglobin of 3.1, down from 10.7 in July -Hemoccult positive, Eliquis held -Gastroenterology consulting, plan for EGD and colonoscopy in a.m. -Transfused 2 units of PRBC overnight, will transfuse an additional unit of blood today -Monitor hemoglobin  Acute kidney injury -Secondary to blood loss -Aldactone held -Lasix x1 today after blood  Chronic diastolic CHF Severe PAH -Continue tadalafil and ambrisentan -Aldactone on hold as noted above -Monitor volume status closely  COPD/chronic respiratory failure -On 2 L home O2 at baseline, stable, monitor  Paroxysmal atrial fibrillation -Continue amiodarone -Eliquis held, see discussion above  History of intermittent hypotension -On as needed midodrine at baseline  Recently started on steroids -For progressive dyspnea on exertion which I suspect is secondary to her anemia primarily -Will taper off and stop steroids in 1 to 2 days  DVT prophylaxis: SCDs Code Status: DNR Family Communication: Spouse at bedside Disposition Plan:  Status is: Inpatient  Remains inpatient appropriate because:Inpatient level of care appropriate due to severity of illness   Dispo: The patient is from:  Home              Anticipated d/c is to: Home              Anticipated d/c date is: 3 days              Patient currently is not medically stable to d/c.  Consultants:  [facility]  Procedures:   Antimicrobials:    Subjective: -Feels a bit better today after blood transfusion -Has been having ongoing dyspnea on exertion for few weeks  Objective: Vitals:   06/06/20 0630 06/06/20 0645 06/06/20 1219 06/06/20 1238  BP: (!) 102/43 (!) 104/46 (!) 103/52 116/66  Pulse: 72 69 77 82  Resp:   14 13  Temp:   98.2 F (36.8 C) 98.1 F (36.7 C)  TempSrc:   Oral Oral  SpO2: 100% 100%  100%  Weight:      Height:        Intake/Output Summary (Last 24 hours) at 06/06/2020 1523 Last data filed at 06/06/2020 0530 Gross per 24 hour  Intake 861 ml  Output --  Net 861 ml   Filed Weights   06/05/20 1746  Weight: 56.2 kg    Examination:  General exam: Chronically ill pleasant female sitting up in bed, AAOx3 Respiratory system: Poor air movement bilaterally Cardiovascular system: S1-S2, regular rhythm, systolic murmur noted gastrointestinal system: Abdomen is nondistended, soft and nontender.Normal bowel sounds heard. Central nervous system: Alert and oriented. No focal neurological deficits. Extremities: No edema Skin: Chronic venous stasis changes Psychiatry: Mood & affect appropriate.     Data Reviewed:   CBC: Recent Labs  Lab 06/05/20 1744 06/06/20 0605  WBC 17.5* 8.0  HGB 3.1* 6.0*  HCT 11.6* 19.8*  MCV 86.6 88.0  PLT  417* 546   Basic Metabolic Panel: Recent Labs  Lab 06/05/20 1142 06/06/20 0605  NA 139 138  K 4.2 3.9  CL 99 99  CO2 29 30  GLUCOSE 119* 86  BUN 27* 30*  CREATININE 1.69* 1.80*  CALCIUM 8.7* 7.9*   GFR: Estimated Creatinine Clearance: 23.4 mL/min (A) (by C-G formula based on SCr of 1.8 mg/dL (H)). Liver Function Tests: No results for input(s): AST, ALT, ALKPHOS, BILITOT, PROT, ALBUMIN in the last 168 hours. No results for input(s): LIPASE,  AMYLASE in the last 168 hours. No results for input(s): AMMONIA in the last 168 hours. Coagulation Profile: No results for input(s): INR, PROTIME in the last 168 hours. Cardiac Enzymes: No results for input(s): CKTOTAL, CKMB, CKMBINDEX, TROPONINI in the last 168 hours. BNP (last 3 results) No results for input(s): PROBNP in the last 8760 hours. HbA1C: No results for input(s): HGBA1C in the last 72 hours. CBG: No results for input(s): GLUCAP in the last 168 hours. Lipid Profile: No results for input(s): CHOL, HDL, LDLCALC, TRIG, CHOLHDL, LDLDIRECT in the last 72 hours. Thyroid Function Tests: No results for input(s): TSH, T4TOTAL, FREET4, T3FREE, THYROIDAB in the last 72 hours. Anemia Panel: No results for input(s): VITAMINB12, FOLATE, FERRITIN, TIBC, IRON, RETICCTPCT in the last 72 hours. Urine analysis:    Component Value Date/Time   COLORURINE YELLOW 02/23/2020 1319   APPEARANCEUR HAZY (A) 02/23/2020 1319   LABSPEC 1.019 02/23/2020 1319   PHURINE 5.0 02/23/2020 1319   GLUCOSEU NEGATIVE 02/23/2020 1319   GLUCOSEU NEGATIVE 07/07/2017 1625   HGBUR NEGATIVE 02/23/2020 1319   HGBUR negative 12/22/2007 1139   BILIRUBINUR NEGATIVE 02/23/2020 1319   KETONESUR NEGATIVE 02/23/2020 1319   PROTEINUR NEGATIVE 02/23/2020 1319   UROBILINOGEN 0.2 07/07/2017 1625   NITRITE NEGATIVE 02/23/2020 1319   LEUKOCYTESUR NEGATIVE 02/23/2020 1319   Sepsis Labs: _0 (procalcitonin:4,lacticidven:4)  ) Recent Results (from the past 240 hour(s))  Respiratory Panel by RT PCR (Flu A&B, Covid) - Nasopharyngeal Swab     Status: None   Collection Time: 06/05/20  8:00 PM   Specimen: Nasopharyngeal Swab  Result Value Ref Range Status   SARS Coronavirus 2 by RT PCR NEGATIVE NEGATIVE Final    Comment: (NOTE) SARS-CoV-2 target nucleic acids are NOT DETECTED.  The SARS-CoV-2 RNA is generally detectable in upper respiratoy specimens during the acute phase of infection. The lowest concentration of  SARS-CoV-2 viral copies this assay can detect is 131 copies/mL. A negative result does not preclude SARS-Cov-2 infection and should not be used as the sole basis for treatment or other patient management decisions. A negative result may occur with  improper specimen collection/handling, submission of specimen other than nasopharyngeal swab, presence of viral mutation(s) within the areas targeted by this assay, and inadequate number of viral copies (<131 copies/mL). A negative result must be combined with clinical observations, patient history, and epidemiological information. The expected result is Negative.  Fact Sheet for Patients:  PinkCheek.be  Fact Sheet for Healthcare Providers:  GravelBags.it  This test is no t yet approved or cleared by the [place] FDA and  has been authorized for detection and/or diagnosis of SARS-CoV-2 by FDA under an Emergency Use Authorization (EUA). This EUA will remain  in effect (meaning this test can be used) for the duration of the COVID-19 declaration under Section 564(b)(1) of the Act, 21 U.S.C. section 360bbb-3(b)(1), unless the authorization is terminated or revoked sooner.     Influenza A by PCR NEGATIVE NEGATIVE Final   Influenza  B by PCR NEGATIVE NEGATIVE Final    Comment: (NOTE) The Xpert Xpress SARS-CoV-2/FLU/RSV assay is intended as an aid in  the diagnosis of influenza from Nasopharyngeal swab specimens and  should not be used as a sole basis for treatment. Nasal washings and  aspirates are unacceptable for Xpert Xpress SARS-CoV-2/FLU/RSV  testing.  Fact Sheet for Patients: PinkCheek.be  Fact Sheet for Healthcare Providers: GravelBags.it  This test is not yet approved or cleared by the [place] FDA and  has been authorized for detection and/or diagnosis of SARS-CoV-2 by  FDA under an Emergency Use  Authorization (EUA). This EUA will remain  in effect (meaning this test can be used) for the duration of the  Covid-19 declaration under Section 564(b)(1) of the Act, 21  U.S.C. section 360bbb-3(b)(1), unless the authorization is  terminated or revoked. Performed at [facility], [place] [address] [place], [place] [address]          Radiology Studies: DG Chest 2 View  Result Date: 06/05/2020 CLINICAL DATA:  Persistent shortness of breath EXAM: CHEST - 2 VIEW COMPARISON:  02/19/2020 FINDINGS: Cardiac shadow remains enlarged. Aortic calcifications are again seen. The lungs are hyperinflated. The left lung is clear. Persistent consolidation in the right lower lobe with associated effusion is noted. No new focal abnormality is seen. IMPRESSION: Persistent right lower lobe consolidation with associated effusion. The overall appearance is similar to that seen June of 2021. Electronically Signed   By: Inez Catalina M.D.   On: 06/05/2020 11:42        Scheduled Meds: . sodium chloride   Intravenous Once  . ambrisentan  10 mg Oral Daily  . amiodarone  100 mg Oral Daily  . furosemide  40 mg Intravenous Once  . pantoprazole  40 mg Oral Daily  . polyethylene glycol  2,000 mL Oral Once   Followed by  . [START ON 06/07/2020] polyethylene glycol  2,000 mL Oral Once  . [START ON 06/07/2020] predniSONE  10 mg Oral Daily  . tadalafil  40 mg Oral Daily   Continuous Infusions:   LOS: 1 day    Time spent: 33mn    PDomenic Polite MD [facility]  06/06/2020, 3:23 PM

## 2020-06-06 NOTE — Consult Note (Addendum)
 Referring Provider:  ? Primary Care Physician:  Bedsole, Amy E, MD Primary Gastroenterologist:  Unassigned  Reason for Consultation:  Anemia and heme positive stool  HPI: Linda Stevens is a 74 y.o. female with medical history significant of severe PAH on O2, dCHF, HTN, recent PAF diagnosis just started on eliquis a couple months ago.  She presented to the ED with c/o gradually worsening SOB over past several weeks time.  Dr. Bensimhon started pt back on daily PO prednisone on 9/14 to see if that would help.  Unfortunately symptoms have continued to progressively worsen.  Unable to get up to walker, etc.  In the ED she was found to have a HGB 3.1 grams down from 10.7 grams in July.  Hemoccult positive.  She denies any dark or bloody stools.  Says that her stools are always kind of loose, which she attributes to one of her medications, but the stool has been brown.  She denies abdominal pain.  Has never had an EGD or colonoscopy in the past.  No dysphagia or weight loss.  Past Medical History:  Diagnosis Date  . Acute respiratory failure with hypoxia and hypercapnea 02/26/2014  . Allergic rhinitis due to pollen   . Allergy   . Arthritis   . Asthma   . CHF (congestive heart failure) (HCC)   . COPD (chronic obstructive pulmonary disease) (HCC)   . Diastolic dysfunction    a. 02/2014 Echo: EF 65-70%, Gr 1 DD, RVH;  b. 09/2015 Echo: EF 55%, Gr 1 DD.  . Emphysema of lung (HCC)   . H/O seasonal allergies   . History of chicken pox   . History of tobacco abuse    a. Quit 2015.  . History of UTI   . Hypertension   . Hypertensive heart disease   . Lower extremity edema   . Pericardial effusion    a. 09/2015 Echo: Mod eff w/o tamponade.  . Right heart failure with reduced right ventricular function (HCC)    a. 09/2015 Echo: EF 55%, Gr 1 DD, D shaped IV septum, sev dil RV with mod reduced fxn, mod dil RA, RV-RA grad 72mmHg, PASP 87mmHg, mod pericard eff w/o tamponade.  . Severe Pulmonary  Hypertension    a. 09/2015 Echo: PASP 87mmHg.    Past Surgical History:  Procedure Laterality Date  . CARDIAC CATHETERIZATION N/A 10/08/2015   Procedure: Right/Left Heart Cath and Coronary Angiography;  Surgeon: Kashmir Lysaght R Bensimhon, MD;  Location: [facility];  Service: Cardiovascular;  Laterality: N/A;  . CHOLECYSTECTOMY     Open  . RIGHT HEART CATH N/A 01/26/2017   Procedure: Right Heart Cath;  Surgeon: Bensimhon, Shanea Karney R, MD;  Location: [facility];  Service: Cardiovascular;  Laterality: N/A;  . RIGHT HEART CATH N/A 08/12/2017   Procedure: RIGHT HEART CATH;  Surgeon: Bensimhon, Diesha Rostad R, MD;  Location: [facility];  Service: Cardiovascular;  Laterality: N/A;    Prior to Admission medications   Medication Sig Start Date End Date Taking? Authorizing Provider  acetaminophen (TYLENOL) 325 MG tablet Take 325 mg by mouth at bedtime as needed for moderate pain or headache.    Yes [provider]  albuterol (PROVENTIL HFA;VENTOLIN HFA) 108 (90 Base) MCG/ACT inhaler Inhale 2 puffs every 6 (six) hours as needed into the lungs for wheezing or shortness of breath. 07/17/17  Yes Wert, Michael B, MD  ambrisentan (LETAIRIS) 10 MG tablet TAKE 1 TABLET BY MOUTH DAILY. DO NOT HANDLE   IF PREGNANT. DO NOT SPLIT, CRUSH, OR CHEW. AVOID INHALATION AND CONTACT WITH SKIN OR EYES . CALL [phone] TO REFILL. Patient taking differently: Take 10 mg by mouth daily.  04/09/20  Yes Bensimhon, Shaune Pascal, MD  amiodarone (PACERONE) 100 MG tablet Take 1 tablet (100 mg total) by mouth daily. 04/09/20  Yes Bensimhon, Shaune Pascal, MD  apixaban (ELIQUIS) 5 MG TABS tablet Take 1 tablet (5 mg total) by mouth 2 (two) times daily. 04/16/20  Yes Bensimhon, Shaune Pascal, MD  aspirin EC 81 MG EC tablet Take 1 tablet (81 mg total) by mouth daily. 10/09/15  Yes Debbe Odea, MD  colchicine 0.6 MG tablet TAKE 1/2 A TABLET BY MOUTH TWICE DAILY Patient taking differently: Take 0.3 mg by mouth 2 (two) times daily.  05/29/20   Yes Bedsole, Amy E, MD  ELIQUIS 5 MG TABS tablet TAKE 1 TABLET BY MOUTH TWICE A DAY Patient taking differently: Take 5 mg by mouth 2 (two) times daily.  04/26/20  Yes Bedsole, Amy E, MD  furosemide (LASIX) 40 MG tablet Take 1 tablet (40 mg total) by mouth daily. 03/11/20  Yes Hosie Poisson, MD  pantoprazole (PROTONIX) 40 MG tablet TAKE 1 TABLET BY MOUTH EVERY DAY Patient taking differently: Take 40 mg by mouth daily.  04/10/20  Yes Bedsole, Amy E, MD  potassium chloride SA (KLOR-CON) 20 MEQ tablet Take 1 tablet (20 mEq total) by mouth daily. 03/27/20  Yes Bedsole, Amy E, MD  predniSONE (DELTASONE) 20 MG tablet TAKE 1 TABLET BY MOUTH EVERY DAY 05/22/20  Yes Bensimhon, Shaune Pascal, MD  spironolactone (ALDACTONE) 25 MG tablet Take 12.5 mg by mouth daily.   Yes [provider]  tadalafil, PAH, (ADCIRCA) 20 MG tablet TAKE 2 TABLETS DAILY Generic for Adcirca Patient taking differently: Take 40 mg by mouth daily.  04/10/20  Yes Bensimhon, Shaune Pascal, MD  midodrine (PROAMATINE) 2.5 MG tablet Take 1 tablet (2.5 mg total) by mouth 3 (three) times daily as needed (SBP<100). Patient not taking: Reported on 06/05/2020 04/26/20   [name], MD  OXYGEN 2 to 2lpm 24/7    [provider]    Current Facility-Administered Medications  Medication Dose Route Frequency Provider Last Rate Last Admin  . acetaminophen (TYLENOL) tablet 650 mg  650 mg Oral Q6H PRN Etta Quill, DO       Or  . acetaminophen (TYLENOL) suppository 650 mg  650 mg Rectal Q6H PRN Etta Quill, DO      . albuterol (VENTOLIN HFA) 108 (90 Base) MCG/ACT inhaler 2 puff  2 puff Inhalation Q6H PRN Etta Quill, DO      . ambrisentan (LETAIRIS) tablet 10 mg  10 mg Oral Daily Jennette Kettle M, DO      . amiodarone (PACERONE) tablet 100 mg  100 mg Oral Daily Jennette Kettle M, DO   100 mg at 06/05/20 2131  . ondansetron (ZOFRAN) tablet 4 mg  4 mg Oral Q6H PRN Etta Quill, DO       Or  . ondansetron [facility]) injection 4 mg  4  mg Intravenous Q6H PRN Etta Quill, DO      . pantoprazole (PROTONIX) EC tablet 40 mg  40 mg Oral Daily Alcario Drought, Jared M, DO      . predniSONE (DELTASONE) tablet 20 mg  20 mg Oral Daily Alcario Drought, Jared M, DO      . tadalafil (PAH) (ADCIRCA) tablet 40 mg  40 mg Oral Daily Etta Quill, DO  Current Outpatient Medications  Medication Sig Dispense Refill  . acetaminophen (TYLENOL) 325 MG tablet Take 325 mg by mouth at bedtime as needed for moderate pain or headache.     . albuterol (PROVENTIL HFA;VENTOLIN HFA) 108 (90 Base) MCG/ACT inhaler Inhale 2 puffs every 6 (six) hours as needed into the lungs for wheezing or shortness of breath. 1 Inhaler 11  . ambrisentan (LETAIRIS) 10 MG tablet TAKE 1 TABLET BY MOUTH DAILY. DO NOT HANDLE IF PREGNANT. DO NOT SPLIT, CRUSH, OR CHEW. AVOID INHALATION AND CONTACT WITH SKIN OR EYES . CALL [phone] TO REFILL. (Patient taking differently: Take 10 mg by mouth daily. ) 90 tablet 3  . amiodarone (PACERONE) 100 MG tablet Take 1 tablet (100 mg total) by mouth daily. 30 tablet 6  . apixaban (ELIQUIS) 5 MG TABS tablet Take 1 tablet (5 mg total) by mouth 2 (two) times daily. 60 tablet 3  . aspirin EC 81 MG EC tablet Take 1 tablet (81 mg total) by mouth daily. 30 tablet 0  . colchicine 0.6 MG tablet TAKE 1/2 A TABLET BY MOUTH TWICE DAILY (Patient taking differently: Take 0.3 mg by mouth 2 (two) times daily. ) 30 tablet 1  . ELIQUIS 5 MG TABS tablet TAKE 1 TABLET BY MOUTH TWICE A DAY (Patient taking differently: Take 5 mg by mouth 2 (two) times daily. ) 60 tablet 2  . furosemide (LASIX) 40 MG tablet Take 1 tablet (40 mg total) by mouth daily. 30 tablet 1  . pantoprazole (PROTONIX) 40 MG tablet TAKE 1 TABLET BY MOUTH EVERY DAY (Patient taking differently: Take 40 mg by mouth daily. ) 90 tablet 3  . potassium chloride SA (KLOR-CON) 20 MEQ tablet Take 1 tablet (20 mEq total) by mouth daily. 90 tablet 1  . predniSONE (DELTASONE) 20 MG tablet TAKE 1 TABLET BY MOUTH  EVERY DAY 30 tablet 0  . spironolactone (ALDACTONE) 25 MG tablet Take 12.5 mg by mouth daily.    . tadalafil, PAH, (ADCIRCA) 20 MG tablet TAKE 2 TABLETS DAILY Generic for Adcirca (Patient taking differently: Take 40 mg by mouth daily. ) 180 tablet 3  . midodrine (PROAMATINE) 2.5 MG tablet Take 1 tablet (2.5 mg total) by mouth 3 (three) times daily as needed (SBP<100). (Patient not taking: Reported on 06/05/2020) 30 tablet 2  . OXYGEN 2 to 2lpm 24/7      Allergies as of 06/05/2020 - Review Complete 06/05/2020  Allergen Reaction Noted  . Amoxicillin-pot clavulanate    . Cefdinir    . Moxifloxacin  08/21/2010  . Singulair [montelukast sodium]  07/10/2015    Family History  Problem Relation Age of Onset  . Glaucoma Brother   . Vascular Disease Father        Died of complications from surgery  . Heart attack Father   . Asthma Mother        Died of asthma attack  . Dementia Mother   . Hyperlipidemia Mother   . Hypertension Mother     Social History   Socioeconomic History  . Marital status: Married    Spouse name: Not on file  . Number of children: Not on file  . Years of education: 2  . Highest education level: Not on file  Occupational History  . Occupation: retired Pharmacist, hospital  Tobacco Use  . Smoking status: Former Smoker    Packs/day: 1.00    Years: 40.00    Pack years: 40.00    Types: Cigarettes    Quit date: 02/22/2014  Years since quitting: 6.2  . Smokeless tobacco: Never Used  Vaping Use  . Vaping Use: Never used  Substance and Sexual Activity  . Alcohol use: No  . Drug use: No  . Sexual activity: Never  Other Topics Concern  . Not on file  Social History Narrative   Married with 2 children.  Independent of ADLs.      Does not have a living will.   Would desire CPR but would not want prolonged life support if futile- husband aware.   Social Determinants of Health   Financial Resource Strain:   . Difficulty of Paying Living Expenses: Not on file  Food  Insecurity:   . Worried About Running Out of Food in the Last Year: Not on file  . Ran Out of Food in the Last Year: Not on file  Transportation Needs:   . Lack of Transportation (Medical): Not on file  . Lack of Transportation (Non-Medical): Not on file  Physical Activity:   . Days of Exercise per Week: Not on file  . Minutes of Exercise per Session: Not on file  Stress:   . Feeling of Stress : Not on file  Social Connections:   . Frequency of Communication with Friends and Family: Not on file  . Frequency of Social Gatherings with Friends and Family: Not on file  . Attends Religious Services: Not on file  . Active Member of Clubs or Organizations: Not on file  . Attends Club or Organization Meetings: Not on file  . Marital Status: Not on file  Intimate Partner Violence:   . Fear of Current or Ex-Partner: Not on file  . Emotionally Abused: Not on file  . Physically Abused: Not on file  . Sexually Abused: Not on file    Review of Systems: ROS is O/W negative except as mentioned in HPI.  Physical Exam: Vital signs in last 24 hours: Temp:  [97.5 F (36.4 C)-98.7 F (37.1 C)] 98.7 F (37.1 C) (09/29 0530) Pulse Rate:  [66-96] 69 (09/29 0645) Resp:  [12-20] 16 (09/29 0530) BP: (93-113)/(41-58) 104/46 (09/29 0645) SpO2:  [96 %-100 %] 100 % (09/29 0645) Weight:  [56.2 kg] 56.2 kg (09/28 1746)   General:  Alert, chronically ill-appearing, pleasant and cooperative in NAD; appears pale Head:  Normocephalic and atraumatic. Eyes:  Sclera clear, no icterus.  Conjunctiva pale. Ears:  Normal auditory acuity. Mouth:  No deformity or lesions.   Lungs:  Clear throughout to auscultation.  No wheezes, crackles, or rhonchi.  Heart:  Regular rate and rhythm; no murmurs, clicks, rubs, or gallops. Abdomen:  Soft, non-distended.  BS present.  Non-tender. Rectal:  Deferred.  Heme positive by EDP.  Msk:  Symmetrical without gross deformities. Pulses:  Normal pulses noted. Extremities:   Without clubbing or edema. Neurologic:  Alert and oriented x 4;  grossly normal neurologically. Skin:  Intact without significant lesions or rashes. Psych:  Alert and cooperative. Normal mood and affect.  Intake/Output from previous day: 09/28 0701 - 09/29 0700 In: 861 [Blood:861] Out: -   Lab Results: Recent Labs    06/05/20 1744 06/06/20 0605  WBC 17.5* 8.0  HGB 3.1* 6.0*  HCT 11.6* 19.8*  PLT 417* 229   BMET Recent Labs    06/05/20 1142 06/06/20 0605  NA 139 138  K 4.2 3.9  CL 99 99  CO2 29 30  GLUCOSE 119* 86  BUN 27* 30*  CREATININE 1.69* 1.80*  CALCIUM 8.7* 7.9*   Studies/Results: DG Chest   2 View  Result Date: 06/05/2020 CLINICAL DATA:  Persistent shortness of breath EXAM: CHEST - 2 VIEW COMPARISON:  02/19/2020 FINDINGS: Cardiac shadow remains enlarged. Aortic calcifications are again seen. The lungs are hyperinflated. The left lung is clear. Persistent consolidation in the right lower lobe with associated effusion is noted. No new focal abnormality is seen. IMPRESSION: Persistent right lower lobe consolidation with associated effusion. The overall appearance is similar to that seen June of 2021. Electronically Signed   By: Mark  Lukens M.D.   On: 06/05/2020 11:42   IMPRESSION:  *Severe symptomatic anemia with Hgb 3.1 grams down from 10.7 grams just two months ago.  Received 2 units and is up to 6.0 grams this AM.  FOBT positive, but no sign of overt GI bleeding.  Likely subacute bleed.  Never had EGD or colonoscopy in the past.  ? Ulcer vs AVM vs malignancy, etc. *Atrial fibrillation, on Eliquis:  On hold. *Severe PAH on O2 *Recently placed on steroids, prednisone 20 mg  PLAN: -Likely will need EGD and colonoscopy to clear GI tract before restarting Eliquis.   -Can have clear liquids for now. -Needs further resuscitated with PRBCs to bring Hgb to a safer level.  Jessica D. Zehr  06/06/2020, 9:09  AM    ________________________________________________________________________  [facility] MD note:  I personally examined the patient, reviewed the data and agree with the assessment and plan described above.  74 yo woman with severe pulm hypertension on home oxygen, CHF, HTN, started on eliquis 2-3 months ago for new paroxysmal Afib.  Now present with Hb 3.1 (from 10.7 3 months ago), heme + stool any NO overt bleeding. She's never had GI testing with colonoscopy or EGD.  No FH of colon cancer. She is getting her third unit of blood out of 4, she is HD stable. Should be able to prep for GI testing (colonoscopy and EGD) tomorrow AM. Last eliquis dose yesterday.    Saleha Kalp, MD [facility] Pager [phone]  

## 2020-06-07 ENCOUNTER — Encounter (HOSPITAL_COMMUNITY): Payer: Self-pay | Admitting: Internal Medicine

## 2020-06-07 ENCOUNTER — Inpatient Hospital Stay (HOSPITAL_COMMUNITY): Payer: Medicare HMO | Admitting: Anesthesiology

## 2020-06-07 ENCOUNTER — Encounter (HOSPITAL_COMMUNITY)
Admission: EM | Disposition: A | Discharge status: Home Health | Payer: Self-pay | Source: Home / Self Care | Attending: Internal Medicine

## 2020-06-07 DIAGNOSIS — K621 Rectal polyp: Secondary | ICD-10-CM

## 2020-06-07 DIAGNOSIS — R195 Other fecal abnormalities: Secondary | ICD-10-CM

## 2020-06-07 DIAGNOSIS — K5520 Angiodysplasia of colon without hemorrhage: Secondary | ICD-10-CM

## 2020-06-07 DIAGNOSIS — D123 Benign neoplasm of transverse colon: Secondary | ICD-10-CM

## 2020-06-07 DIAGNOSIS — D128 Benign neoplasm of rectum: Secondary | ICD-10-CM

## 2020-06-07 DIAGNOSIS — K635 Polyp of colon: Secondary | ICD-10-CM

## 2020-06-07 DIAGNOSIS — K229 Disease of esophagus, unspecified: Secondary | ICD-10-CM

## 2020-06-07 HISTORY — PX: COLONOSCOPY WITH PROPOFOL: SHX5780

## 2020-06-07 HISTORY — PX: POLYPECTOMY: SHX5525

## 2020-06-07 HISTORY — PX: ESOPHAGOGASTRODUODENOSCOPY (EGD) WITH PROPOFOL: SHX5813

## 2020-06-07 LAB — CBC WITH DIFFERENTIAL/PLATELET
Abs Immature Granulocytes: 0.13 10*3/uL — ABNORMAL HIGH (ref 0.00–0.07)
Basophils Absolute: 0 10*3/uL (ref 0.0–0.1)
Basophils Relative: 0 %
Eosinophils Absolute: 0.2 10*3/uL (ref 0.0–0.5)
Eosinophils Relative: 2 %
HCT: 26.6 % — ABNORMAL LOW (ref 36.0–46.0)
Hemoglobin: 8.4 g/dL — ABNORMAL LOW (ref 12.0–15.0)
Immature Granulocytes: 2 %
Lymphocytes Relative: 5 %
Lymphs Abs: 0.4 10*3/uL — ABNORMAL LOW (ref 0.7–4.0)
MCH: 26.3 pg (ref 26.0–34.0)
MCHC: 31.6 g/dL (ref 30.0–36.0)
MCV: 83.1 fL (ref 80.0–100.0)
Monocytes Absolute: 0.6 10*3/uL (ref 0.1–1.0)
Monocytes Relative: 8 %
Neutro Abs: 7.1 10*3/uL (ref 1.7–7.7)
Neutrophils Relative %: 83 %
Platelets: 170 10*3/uL (ref 150–400)
RBC: 3.2 MIL/uL — ABNORMAL LOW (ref 3.87–5.11)
RDW: 17.1 % — ABNORMAL HIGH (ref 11.5–15.5)
WBC: 8.4 10*3/uL (ref 4.0–10.5)
nRBC: 1.1 % — ABNORMAL HIGH (ref 0.0–0.2)

## 2020-06-07 SURGERY — ESOPHAGOGASTRODUODENOSCOPY (EGD) WITH PROPOFOL
Anesthesia: Monitor Anesthesia Care

## 2020-06-07 MED ORDER — GLYCOPYRROLATE 0.2 MG/ML IJ SOLN
INTRAMUSCULAR | Status: DC | PRN
  Administered 2020-06-07: .1 mg via INTRAVENOUS

## 2020-06-07 MED ORDER — PROPOFOL 10 MG/ML IV BOLUS
INTRAVENOUS | Status: DC | PRN
  Administered 2020-06-07 (×3): 10 mg via INTRAVENOUS
  Administered 2020-06-07: 20 mg via INTRAVENOUS

## 2020-06-07 MED ORDER — PHENYLEPHRINE 40 MCG/ML (10ML) SYRINGE FOR IV PUSH (FOR BLOOD PRESSURE SUPPORT)
PREFILLED_SYRINGE | INTRAVENOUS | Status: DC | PRN
  Administered 2020-06-07 (×8): 80 ug via INTRAVENOUS

## 2020-06-07 MED ORDER — FLUCONAZOLE 100 MG PO TABS
100.0000 mg | ORAL_TABLET | Freq: Every day | ORAL | Status: DC
  Administered 2020-06-08 – 2020-06-09 (×2): 100 mg via ORAL
  Filled 2020-06-07 (×2): qty 1

## 2020-06-07 MED ORDER — CALCIUM CHLORIDE 10 % IV SOLN
INTRAVENOUS | Status: DC | PRN
  Administered 2020-06-07: 250 mg via INTRAVENOUS

## 2020-06-07 MED ORDER — POTASSIUM CHLORIDE CRYS ER 20 MEQ PO TBCR
20.0000 meq | EXTENDED_RELEASE_TABLET | Freq: Once | ORAL | Status: AC
  Administered 2020-06-07: 20 meq via ORAL
  Filled 2020-06-07: qty 1

## 2020-06-07 MED ORDER — KETAMINE HCL 50 MG/5ML IJ SOSY
PREFILLED_SYRINGE | INTRAMUSCULAR | Status: AC
  Filled 2020-06-07: qty 5

## 2020-06-07 MED ORDER — FUROSEMIDE 40 MG PO TABS
40.0000 mg | ORAL_TABLET | Freq: Every day | ORAL | Status: DC
  Administered 2020-06-07 – 2020-06-09 (×3): 40 mg via ORAL
  Filled 2020-06-07 (×3): qty 1

## 2020-06-07 MED ORDER — LIDOCAINE VISCOUS HCL 2 % MT SOLN
OROMUCOSAL | Status: DC | PRN
  Administered 2020-06-07: 10 mL via OROMUCOSAL

## 2020-06-07 MED ORDER — PROPOFOL 500 MG/50ML IV EMUL
INTRAVENOUS | Status: DC | PRN
  Administered 2020-06-07: 50 ug/kg/min via INTRAVENOUS

## 2020-06-07 MED ORDER — ALBUMIN HUMAN 5 % IV SOLN: INTRAVENOUS | Status: DC | PRN

## 2020-06-07 MED ORDER — FLUCONAZOLE 200 MG PO TABS
200.0000 mg | ORAL_TABLET | Freq: Once | ORAL | Status: AC
  Administered 2020-06-07: 200 mg via ORAL
  Filled 2020-06-07: qty 2
  Filled 2020-06-07: qty 1

## 2020-06-07 MED ORDER — LIDOCAINE VISCOUS HCL 2 % MT SOLN
OROMUCOSAL | Status: AC
  Filled 2020-06-07: qty 15

## 2020-06-07 SURGICAL SUPPLY — 25 items

## 2020-06-07 NOTE — Op Note (Signed)
[facility] Patient Name: Linda Stevens Procedure Date : 06/07/2020 MRN: [id number] Attending MD: Jerene Bears , MD Date of Birth: [birth year] CSN: [id number] Age: 74 Admit Type: Inpatient Procedure:                Colonoscopy Indications:              Heme positive stool, subacute post hemorrhagic                            anemia occurring approximately 2 months after                            starting apixaban therapy Providers:                Lajuan Lines. Hilarie Fredrickson, MD, Burtis Junes, RN, Elspeth Cho                            Tech., Technician, Dorian Furnace Referring MD:             [facility] Medicines:                Monitored Anesthesia Care Complications:            No immediate complications. Estimated Blood Loss:     Estimated blood loss was minimal. Procedure:                Pre-Anesthesia Assessment:                           - Prior to the procedure, a History and Physical                            was performed, and patient medications and                            allergies were reviewed. The patient's tolerance of                            previous anesthesia was also reviewed. The risks                            and benefits of the procedure and the sedation                            options and risks were discussed with the patient.                            All questions were answered, and informed consent                            was obtained. Prior Anticoagulants: The patient has                            taken Eliquis (apixaban), last dose was 2 days  prior to procedure. ASA Grade Assessment: III - A                            patient with severe systemic disease. After                            reviewing the risks and benefits, the patient was                            deemed in satisfactory condition to undergo the                            procedure.                           After obtaining  informed consent, the colonoscope                            was passed under direct vision. Throughout the                            procedure, the patient's blood pressure, pulse, and                            oxygen saturations were monitored continuously. The                            PCF-H190DL ([id number]) Olympus pediatric colonoscope                            was introduced through the anus and advanced to the                            cecum, identified by appendiceal orifice and                            ileocecal valve. The colonoscopy was performed                            without difficulty. The patient tolerated the                            procedure well. The quality of the bowel                            preparation was good. The ileocecal valve,                            appendiceal orifice, and rectum were photographed. Scope In: 8:08:31 AM Scope Out: 8:26:53 AM Scope Withdrawal Time: 0 hours 13 minutes 47 seconds  Total Procedure Duration: 0 hours 18 minutes 22 seconds  Findings:      Hemorrhoids were found on perianal exam.      A 15 mm polyp was found in the transverse colon. The polyp was sessile.  The polyp was removed with a cold snare. Resection and retrieval were       complete.      A 8 mm polyp was found in the proximal rectum. The polyp was sessile.       The polyp was removed with a cold snare. Resection and retrieval were       complete.      Multiple diffuse vascular ectasias without bleeding were found scattered       in the entire colon. These do not have the classic appearance of an       angioectasia, but are likely the physiologic result of severe pulmonary       arterial hypertension. None of these lesions were actively bleeding nor       did they have contact bleeding. However, the are likely the causing of       slow GI blood loss. These lesions are too numerous to be completely       ablated by APC.      Multiple small and  large-mouthed diverticula were found in the sigmoid       colon.      The entire examined colon appeared normal.      Internal hemorrhoids were found during digital exam. Impression:               - One 15 mm polyp in the transverse colon, removed                            with a cold snare. Resected and retrieved.                           - One 8 mm polyp in the proximal rectum, removed                            with a cold snare. Resected and retrieved.                           - Multiple non-bleeding colonic vascular ectasias                            (see description above). Most likely causing of                            slow GI blood loss in the setting of Eliquis.                           - Diverticulosis in the sigmoid colon.                           - Small internal hemorrhoids. Moderate Sedation:      N/A Recommendation:           - Return patient to hospital ward for ongoing care.                           - Resume previous diet.                           - Continue present medications.                           -  Giving vascular ectasia, too numerous for                            ablation, risk of recurrent anemia, I would                            recommend chronic anticoagulation be discontinued.                            Will defer this to cardiology and primary team.                            Would be okay for aspirin therapy. Will need to                            have Hgb followed closely after discharge. Monitor                            iron stores and replace as needed.                           - Await pathology results.                           - No recommendation at this time regarding repeat                            colonoscopy due to age.                           - GI consult team will sign off, call if questions. Procedure Code(s):        --- Professional ---                           [phone], Colonoscopy, flexible; with removal of                             tumor(s), polyp(s), or other lesion(s) by snare                            technique Diagnosis Code(s):        --- Professional ---                           K64.8, Other hemorrhoids                           K63.5, Polyp of colon                           K62.1, Rectal polyp                           K55.20, Angiodysplasia of colon without hemorrhage  R19.5, Other fecal abnormalities                           D62, Acute posthemorrhagic anemia                           K57.30, Diverticulosis of large intestine without                            perforation or abscess without bleeding CPT copyright 2019 [employer] Medical Association. All rights reserved. The codes documented in this report are preliminary and upon coder review may  be revised to meet current compliance requirements. Jerene Bears, MD 06/07/2020 9:00:41 AM This report has been signed electronically. Number of Addenda: 0

## 2020-06-07 NOTE — Transfer of Care (Signed)
Immediate Anesthesia Transfer of Care Note  Patient: Linda Stevens  Procedure(s) Performed: ESOPHAGOGASTRODUODENOSCOPY (EGD) WITH PROPOFOL (N/A ) COLONOSCOPY WITH PROPOFOL (N/A ) POLYPECTOMY  Patient Location: PACU  Anesthesia Type:MAC  Level of Consciousness: awake, alert  and oriented  Airway & Oxygen Therapy: Patient Spontanous Breathing and Patient connected to nasal cannula oxygen  Post-op Assessment: Report given to RN and Post -op Vital signs reviewed and stable  Post vital signs: Reviewed and stable  Last Vitals:  Vitals Value Taken Time  BP 99/51 06/07/20 0835  Temp    Pulse 87 06/07/20 0838  Resp 14 06/07/20 0838  SpO2 95 % 06/07/20 0838  Vitals shown include unvalidated device data.  Last Pain:  Vitals:   06/07/20 0716  TempSrc: Oral  PainSc:          Complications: No complications documented.

## 2020-06-07 NOTE — Evaluation (Signed)
Physical Therapy Evaluation Patient Details Name: Linda Stevens MRN: [id number] DOB: [birth year] Today's Date: 06/07/2020   History of Present Illness  74 y.o. female with medical history significant of severe PAH, dCHF, HTN, recent PAF diagnosis just started on eliquis a couple months ago. Pt presents to ED with c/o gradually worsening SOB over past several weeks time. Pt dound to be anemic in ED. Pt underwent EGD and colonoscopy on 06/07/2020.  Clinical Impression  Pt presents to PT with deficits in functional mobility, gait, activity tolerance, strength, power. Pt requires physical assistance initially to transfer but with PT cues and use of RW improves to supervision level. Pt does fatigue some with mobility but is also self-limiting at times during session due to anxiety and desire to not push herself too hard prior to discharging home. Pt will benefit from continued acute PT POC to progress upon LE weakness and to improve activity tolerance. PT recommends discharge home with HHPT and supervision from family for out of bed mobility.    Follow Up Recommendations Home health PT;Supervision for mobility/OOB    Equipment Recommendations  None recommended by PT    Recommendations for Other Services       Precautions / Restrictions Precautions Precautions: Fall Precaution Comments: monitor SpO2 Restrictions Weight Bearing Restrictions: No      Mobility  Bed Mobility Overal bed mobility: Needs Assistance Bed Mobility: Supine to Sit;Sit to Supine     Supine to sit: Supervision;HOB elevated Sit to supine: Supervision;HOB elevated      Transfers Overall transfer level: Needs assistance Equipment used: 1 person hand held assist;Rolling walker (2 wheeled) Transfers: Sit to/from Stand Sit to Stand: Min assist;Supervision         General transfer comment: minA with hand hold, supervision with use of RW  Ambulation/Gait Ambulation/Gait assistance: Supervision Gait  Distance (Feet): 80 Feet Assistive device: Rolling walker (2 wheeled) Gait Pattern/deviations: Step-to pattern Gait velocity: reduced Gait velocity interpretation: 1.31 - 2.62 ft/sec, indicative of limited community ambulator General Gait Details: pt with step to gait, reduced gait speed, no LOB noted  Stairs            Wheelchair Mobility    Modified Rankin (Stroke Patients Only)       Balance Overall balance assessment: Needs assistance Sitting-balance support: No upper extremity supported;Feet supported Sitting balance-Leahy Scale: Good     Standing balance support: Single extremity supported Standing balance-Leahy Scale: Poor Standing balance comment: reliant on UE support of hand hold or RW                             Pertinent Vitals/Pain Pain Assessment: No/denies pain    Home Living Family/patient expects to be discharged to:: Private residence Living Arrangements: Spouse/significant other;Other relatives Available Help at Discharge: Family;Available 24 hours/day (temporary 24/7) Type of Home: House Home Access: Stairs to enter Entrance Stairs-Rails: Right Entrance Stairs-Number of Steps: 2 Home Layout: One level Home Equipment: Walker - 2 wheels      Prior Function Level of Independence: Needs assistance   Gait / Transfers Assistance Needed: Independent with amb without assistive device (utilizes support of furniture)  ADL's / Homemaking Assistance Needed: Husband assist in/out of tub        Hand Dominance        Extremity/Trunk Assessment   Upper Extremity Assessment Upper Extremity Assessment: Overall WFL for tasks assessed    Lower Extremity Assessment Lower Extremity Assessment: Generalized weakness  Cervical / Trunk Assessment Cervical / Trunk Assessment: Normal  Communication   Communication: No difficulties  Cognition Arousal/Alertness: Awake/alert Behavior During Therapy: Anxious Overall Cognitive Status:  Within Functional Limits for tasks assessed                                        General Comments General comments (skin integrity, edema, etc.): pt on 4L [place], pulse ox not reading accurately for most of session, pt reports some increased work of breathing during mobility but remains able to converse with PT. When pt resting and still SpO2 reads in 90s, when mobilizing pleth with poor wave form and reading in 60s.    Exercises     Assessment/Plan    PT Assessment Patient needs continued PT services  PT Problem List Decreased strength;Decreased activity tolerance;Decreased balance;Decreased mobility;Decreased knowledge of use of DME;Cardiopulmonary status limiting activity       PT Treatment Interventions DME instruction;Gait training;Functional mobility training;Balance training;Therapeutic exercise;Therapeutic activities;Patient/family education    PT Goals (Current goals can be found in the Care Plan section)  Acute Rehab PT Goals Patient Stated Goal: To go home PT Goal Formulation: With patient Time For Goal Achievement: 06/21/20 Potential to Achieve Goals: Good    Frequency Min 3X/week   Barriers to discharge        Co-evaluation               AM-PAC PT "6 Clicks" Mobility  Outcome Measure Help needed turning from your back to your side while in a flat bed without using bedrails?: None Help needed moving from lying on your back to sitting on the side of a flat bed without using bedrails?: None Help needed moving to and from a bed to a chair (including a wheelchair)?: A Little Help needed standing up from a chair using your arms (e.g., wheelchair or bedside chair)?: A Little Help needed to walk in hospital room?: None Help needed climbing 3-5 steps with a railing? : A Little 6 Click Score: 21    End of Session Equipment Utilized During Treatment: Oxygen Activity Tolerance: Patient tolerated treatment well Patient left: in bed;with call  bell/phone within reach;with bed alarm set Nurse Communication: Mobility status PT Visit Diagnosis: Muscle weakness (generalized) (M62.81);Other (comment) (cardiopulmonary endurance)    Time: 8718-3672 PT Time Calculation (min) (ACUTE ONLY): 26 min   Charges:   PT Evaluation $PT Eval Low Complexity: 1 Low PT Treatments $Gait Training: 8-22 mins        Zenaida Niece, PT, DPT Acute Rehabilitation Pager: [phone]   Zenaida Niece 06/07/2020, 5:08 PM

## 2020-06-07 NOTE — Op Note (Signed)
[facility] Patient Name: Linda Stevens Procedure Date : 06/07/2020 MRN: [id number] Attending MD: Jerene Bears , MD Date of Birth: Jan 01, 1946 CSN: [id number] Age: 74 Admit Type: Inpatient Procedure:                Upper GI endoscopy Indications:              Acute post hemorrhagic anemia, Heme positive stool Providers:                Lajuan Lines. Hilarie Fredrickson, MD, Burtis Junes, RN, Elspeth Cho                            Tech., Technician, Dorian Furnace Referring MD:             [facility] Medicines:                Monitored Anesthesia Care Complications:            No immediate complications. Estimated Blood Loss:     Estimated blood loss: none. Procedure:                Pre-Anesthesia Assessment:                           - Prior to the procedure, a History and Physical                            was performed, and patient medications and                            allergies were reviewed. The patient's tolerance of                            previous anesthesia was also reviewed. The risks                            and benefits of the procedure and the sedation                            options and risks were discussed with the patient.                            All questions were answered, and informed consent                            was obtained. Prior Anticoagulants: The patient has                            taken Eliquis (apixaban), last dose was 2 days                            prior to procedure. ASA Grade Assessment: III - A                            patient with severe systemic disease. After  reviewing the risks and benefits, the patient was                            deemed in satisfactory condition to undergo the                            procedure.                           After obtaining informed consent, the endoscope was                            passed under direct vision. Throughout the                             procedure, the patient's blood pressure, pulse, and                            oxygen saturations were monitored continuously. The                            GIF-H190 ([id number] Olympus gastroscope was                            introduced through the mouth, and advanced to the                            second part of duodenum. The upper GI endoscopy was                            accomplished without difficulty. The patient                            tolerated the procedure well. Scope In: Scope Out: Findings:      Patchy, white plaques were found in the middle third of the esophagus       and in the lower third of the esophagus. These plaques do not wash off       with water jet, consistent with Candida.      A 2 cm hiatal hernia was present.      The gastroesophageal flap valve was visualized endoscopically and       classified as Hill Grade III (minimal fold, loose to endoscope, hiatal       hernia likely).      The entire examined stomach was normal.      The examined duodenum was normal. Impression:               - Esophageal plaques were found, consistent with                            candidiasis.                           - 2 cm hiatal hernia.                           -  Normal stomach.                           - Normal examined duodenum.                           - No source of GI blood loss on this examination.                           - No specimens collected. Moderate Sedation:      N/A Recommendation:           - Return patient to hospital ward for ongoing care.                           - Resume previous diet.                           - Continue present medications.                           - See the other procedure note for documentation of                            additional recommendations. Procedure Code(s):        --- Professional ---                           [phone], Esophagogastroduodenoscopy, flexible,                            transoral;  diagnostic, including collection of                            specimen(s) by brushing or washing, when performed                            (separate procedure) Diagnosis Code(s):        --- Professional ---                           K22.9, Disease of esophagus, unspecified                           K44.9, Diaphragmatic hernia without obstruction or                            gangrene                           D62, Acute posthemorrhagic anemia                           R19.5, Other fecal abnormalities CPT copyright 2019 American Medical Association. All rights reserved. The codes documented in this report are preliminary and upon coder review may  be revised to meet current compliance requirements. Jerene Bears, MD 06/07/2020 8:39:24 AM This report has been signed electronically. Number of Addenda: 0

## 2020-06-07 NOTE — Progress Notes (Signed)
PROGRESS NOTE    Linda Stevens  ZOX:[id number] DOB: 1945/09/30 DOA: 06/05/2020 PCP: [name], MD  Brief Narrative: Frail 74 year old female with history of chronic diastolic CHF, severe pulmonary hypertension, chronic respiratory failure on 2 L home O2, hypertension, recent diagnosis of paroxysmal atrial fibrillation started on Eliquis a few months ago.  Presented to the emergency room with worsening dyspnea on exertion -She was recently started on prednisone to help with her dyspnea -Work-up in the emergency room noted hemoglobin of 3.1 down from 10.7 in July, Hemoccult was positive, patient denied any overt bleeding   Assessment & Plan:  Severe symptomatic anemia Suspect subacute blood loss, no overt bleeding noted, admitted with hemoglobin of 3.1, down from 10.7 in July -Hemoccult positive, Eliquis held -Gastroenterology consulting, -Underwent EGD and colonoscopy today, concern for esophageal candidiasis, multiple polyps removed from the colon and colonic AVMs noted which is suspected to be the cause of ongoing bleeding in the setting of Eliquis -Transfused 3 units of PRBC this admission -Monitor hemoglobin  Acute kidney injury -Secondary to blood loss -Aldactone held -BMP in a.m.  Chronic diastolic CHF Severe PAH -Continue tadalafil and ambrisentan -Restart oral Lasix today  COPD/chronic respiratory failure -On 2 L home O2 at baseline, stable, monitor  Paroxysmal atrial fibrillation -Continue amiodarone -Eliquis held, see discussion above  History of intermittent hypotension -On as needed midodrine at baseline  Recently started on steroids -For progressive dyspnea on exertion which I suspect is secondary to her anemia primarily -Will taper off and stop steroids   DVT prophylaxis: SCDs Code Status: DNR Family Communication: Spouse at bedside Disposition Plan:  Status is: Inpatient  Remains inpatient appropriate because:Inpatient level of care  appropriate due to severity of illness   Dispo: The patient is from: Home              Anticipated d/c is to: Home              Anticipated d/c date is: 1 to 2 days              Patient currently is not medically stable to d/c.  Consultants:  [facility] Procedures: EGD  Impression:               - Esophageal plaques were found, consistent with                            candidiasis.                           - 2 cm hiatal hernia.                           - Normal stomach.                           - Normal examined duodenum.                           - No source of GI blood loss on this examination.   colonoscopy  - One 15 mm polyp in the transverse colon, removed                            with a cold snare. Resected and retrieved.                           -  One 8 mm polyp in the proximal rectum, removed                            with a cold snare. Resected and retrieved.                           - Multiple non-bleeding colonic vascular ectasias                            (see description above). Most likely causing of                            slow GI blood loss in the setting of Eliquis.                           - Diverticulosis in the sigmoid colon.                            - Small internal hemorrhoids.  Antimicrobials:    Subjective: -Feels much better today, back from EGD and colonoscopy  Objective: Vitals:   06/07/20 0906 06/07/20 0923 06/07/20 1208 06/07/20 1210  BP: (!) 110/59 (!) 128/54 (!) 96/42 (!) 104/44  Pulse: 85 85 83   Resp: _0 Temp:  97.8 F (36.6 C) 98 F (36.7 C)   TempSrc:  Oral Oral   SpO2: 100% 100% 100%   Weight:      Height:        Intake/Output Summary (Last 24 hours) at 06/07/2020 1412 Last data filed at 06/07/2020 1301 Gross per 24 hour  Intake 3190 ml  Output --  Net 3190 ml   Filed Weights   06/05/20 1746  Weight: 56.2 kg    Examination:  General exam: Chronically ill pleasant female sitting up in bed, AAOx3, no  distress CVS: S1-S2, regular rate rhythm Lungs: Few basilar rales Abdomen: Soft, nontender, bowel sounds present Extremities: Trace edema Skin: Chronic venous stasis changes Psychiatry: Mood & affect appropriate.     Data Reviewed:   CBC: Recent Labs  Lab 06/05/20 1744 06/06/20 0605 06/07/20 0239  WBC 17.5* 8.0 8.4  NEUTROABS  --   --  7.1  HGB 3.1* 6.0* 8.4*  HCT 11.6* 19.8* 26.6*  MCV 86.6 88.0 83.1  PLT 417* 229 401   Basic Metabolic Panel: Recent Labs  Lab 06/05/20 1142 06/06/20 0605  NA 139 138  K 4.2 3.9  CL 99 99  CO2 29 30  GLUCOSE 119* 86  BUN 27* 30*  CREATININE 1.69* 1.80*  CALCIUM 8.7* 7.9*   GFR: Estimated Creatinine Clearance: 23.4 mL/min (A) (by C-G formula based on SCr of 1.8 mg/dL (H)). Liver Function Tests: No results for input(s): AST, ALT, ALKPHOS, BILITOT, PROT, ALBUMIN in the last 168 hours. No results for input(s): LIPASE, AMYLASE in the last 168 hours. No results for input(s): AMMONIA in the last 168 hours. Coagulation Profile: No results for input(s): INR, PROTIME in the last 168 hours. Cardiac Enzymes: No results for input(s): CKTOTAL, CKMB, CKMBINDEX, TROPONINI in the last 168 hours. BNP (last 3 results) No results for input(s): PROBNP in the last 8760 hours. HbA1C: No results for input(s): HGBA1C in the last 72 hours. CBG: No results for input(s): GLUCAP  in the last 168 hours. Lipid Profile: No results for input(s): CHOL, HDL, LDLCALC, TRIG, CHOLHDL, LDLDIRECT in the last 72 hours. Thyroid Function Tests: No results for input(s): TSH, T4TOTAL, FREET4, T3FREE, THYROIDAB in the last 72 hours. Anemia Panel: Recent Labs    06/06/20 1804  VITAMINB12 159*  FOLATE 10.1  FERRITIN 29  TIBC 455*  IRON 161  RETICCTPCT 1.7   Urine analysis:    Component Value Date/Time   COLORURINE YELLOW 02/23/2020 1319   APPEARANCEUR HAZY (A) 02/23/2020 1319   LABSPEC 1.019 02/23/2020 1319   PHURINE 5.0 02/23/2020 1319   GLUCOSEU NEGATIVE  02/23/2020 1319   GLUCOSEU NEGATIVE 07/07/2017 1625   HGBUR NEGATIVE 02/23/2020 1319   HGBUR negative 12/22/2007 1139   BILIRUBINUR NEGATIVE 02/23/2020 1319   KETONESUR NEGATIVE 02/23/2020 1319   PROTEINUR NEGATIVE 02/23/2020 1319   UROBILINOGEN 0.2 07/07/2017 1625   NITRITE NEGATIVE 02/23/2020 1319   LEUKOCYTESUR NEGATIVE 02/23/2020 1319   Sepsis Labs: _0 (procalcitonin:4,lacticidven:4)  ) Recent Results (from the past 240 hour(s))  Respiratory Panel by RT PCR (Flu A&B, Covid) - Nasopharyngeal Swab     Status: None   Collection Time: 06/05/20  8:00 PM   Specimen: Nasopharyngeal Swab  Result Value Ref Range Status   SARS Coronavirus 2 by RT PCR NEGATIVE NEGATIVE Final    Comment: (NOTE) SARS-CoV-2 target nucleic acids are NOT DETECTED.  The SARS-CoV-2 RNA is generally detectable in upper respiratoy specimens during the acute phase of infection. The lowest concentration of SARS-CoV-2 viral copies this assay can detect is 131 copies/mL. A negative result does not preclude SARS-Cov-2 infection and should not be used as the sole basis for treatment or other patient management decisions. A negative result may occur with  improper specimen collection/handling, submission of specimen other than nasopharyngeal swab, presence of viral mutation(s) within the areas targeted by this assay, and inadequate number of viral copies (<131 copies/mL). A negative result must be combined with clinical observations, patient history, and epidemiological information. The expected result is Negative.  Fact Sheet for Patients:  PinkCheek.be  Fact Sheet for Healthcare Providers:  GravelBags.it  This test is no t yet approved or cleared by the [place] FDA and  has been authorized for detection and/or diagnosis of SARS-CoV-2 by FDA under an Emergency Use Authorization (EUA). This EUA will remain  in effect (meaning this test can  be used) for the duration of the COVID-19 declaration under Section 564(b)(1) of the Act, 21 U.S.C. section 360bbb-3(b)(1), unless the authorization is terminated or revoked sooner.     Influenza A by PCR NEGATIVE NEGATIVE Final   Influenza B by PCR NEGATIVE NEGATIVE Final    Comment: (NOTE) The Xpert Xpress SARS-CoV-2/FLU/RSV assay is intended as an aid in  the diagnosis of influenza from Nasopharyngeal swab specimens and  should not be used as a sole basis for treatment. Nasal washings and  aspirates are unacceptable for Xpert Xpress SARS-CoV-2/FLU/RSV  testing.  Fact Sheet for Patients: PinkCheek.be  Fact Sheet for Healthcare Providers: GravelBags.it  This test is not yet approved or cleared by the [place] FDA and  has been authorized for detection and/or diagnosis of SARS-CoV-2 by  FDA under an Emergency Use Authorization (EUA). This EUA will remain  in effect (meaning this test can be used) for the duration of the  Covid-19 declaration under Section 564(b)(1) of the Act, 21  U.S.C. section 360bbb-3(b)(1), unless the authorization is  terminated or revoked. Performed at [facility], [place]  [address] [place],  [address]          Radiology Studies: No results found.      Scheduled Meds: . sodium chloride   Intravenous Once  . ambrisentan  10 mg Oral Daily  . amiodarone  100 mg Oral Daily  . [START ON 06/08/2020] fluconazole  100 mg Oral Daily  . pantoprazole  40 mg Oral Daily  . tadalafil  40 mg Oral Daily   Continuous Infusions:   LOS: 2 days    Time spent: 14mn    PDomenic Polite MD [facility]  06/07/2020, 2:12 PM

## 2020-06-07 NOTE — Interval H&P Note (Signed)
History and Physical Interval Note: For egd/colon this am to eval severe anemia and heme + stools in the setting of Eliquis.  Eliquis on hold. CBC Latest Ref Rng & Units 06/07/2020 06/06/2020 06/05/2020  WBC 4.0 - 10.5 K/uL 8.4 8.0 17.5(H)  Hemoglobin 12.0 - 15.0 g/dL 8.4(L) 6.0(LL) 3.1(LL)  Hematocrit 36 - 46 % 26.6(L) 19.8(L) 11.6(L)  Platelets 150 - 400 K/uL 170 229 417(H)     06/07/2020 7:40 AM  [name]  has presented today for surgery, with the diagnosis of Anemia and heme positive stool.  The various methods of treatment have been discussed with the patient and family. After consideration of risks, benefits and other options for treatment, the patient has consented to  Procedure(s): ESOPHAGOGASTRODUODENOSCOPY (EGD) WITH PROPOFOL (N/A) COLONOSCOPY WITH PROPOFOL (N/A) as a surgical intervention.  The patient's history has been reviewed, patient examined, no change in status, stable for surgery.  I have reviewed the patient's chart and labs.  Questions were answered to the patient's satisfaction.     Lajuan Lines Tyisha Cressy

## 2020-06-07 NOTE — Progress Notes (Signed)
Mobility Specialist: Progress Note   06/07/20 1400  Mobility  Activity Ambulated in hall  Level of Assistance Minimal assist, patient does 75% or more  Assistive Device Front wheel walker  Distance Ambulated (ft) 100 ft  Mobility Response Tolerated well  Mobility performed by Mobility specialist  Bed Position Semi-fowlers  $Mobility charge 1 Mobility   Pre-Mobility:   Supine: 76 HR, 93/43 BP, 100% SpO2  Sitting: 96/55 BP Post-Mobility: 92 HR, 114/56 BP, 100% SpO2  Pt woke up upon entering room but was agreeable to walk. Pt had no c/o during ambulation and said she hopeful to go home tomorrow.   [facility] Lorrin Nawrot Mobility Specialist

## 2020-06-07 NOTE — Anesthesia Postprocedure Evaluation (Signed)
Anesthesia Post Note  Patient: Linda Stevens  Procedure(s) Performed: ESOPHAGOGASTRODUODENOSCOPY (EGD) WITH PROPOFOL (N/A ) COLONOSCOPY WITH PROPOFOL (N/A ) POLYPECTOMY     Patient location during evaluation: PACU Anesthesia Type: MAC Level of consciousness: awake and alert Pain management: pain level controlled Vital Signs Assessment: post-procedure vital signs reviewed and stable Respiratory status: spontaneous breathing, nonlabored ventilation, respiratory function stable and patient connected to nasal cannula oxygen Cardiovascular status: stable and blood pressure returned to baseline Postop Assessment: no apparent nausea or vomiting Anesthetic complications: no   No complications documented.  Last Vitals:  Vitals:   06/07/20 0906 06/07/20 0923  BP: (!) 110/59 (!) 128/54  Pulse: 85 85  Resp: 18 18  Temp:  36.6 C  SpO2: 100% 100%    Last Pain:  Vitals:   06/07/20 0923  TempSrc: Oral  PainSc:                  Lala Been A.

## 2020-06-08 LAB — CBC
HCT: 23 % — ABNORMAL LOW (ref 36.0–46.0)
HCT: 26.2 % — ABNORMAL LOW (ref 36.0–46.0)
Hemoglobin: 7.1 g/dL — ABNORMAL LOW (ref 12.0–15.0)
Hemoglobin: 7.9 g/dL — ABNORMAL LOW (ref 12.0–15.0)
MCH: 26.3 pg (ref 26.0–34.0)
MCH: 24.9 pg — ABNORMAL LOW (ref 26.0–34.0)
MCHC: 30.9 g/dL (ref 30.0–36.0)
MCHC: 30.2 g/dL (ref 30.0–36.0)
MCV: 85.2 fL (ref 80.0–100.0)
MCV: 82.6 fL (ref 80.0–100.0)
Platelets: 145 K/uL — ABNORMAL LOW (ref 150–400)
Platelets: 123 K/uL — ABNORMAL LOW (ref 150–400)
RBC: 2.7 MIL/uL — ABNORMAL LOW (ref 3.87–5.11)
RBC: 3.17 MIL/uL — ABNORMAL LOW (ref 3.87–5.11)
RDW: 17.4 % — ABNORMAL HIGH (ref 11.5–15.5)
RDW: 19.2 % — ABNORMAL HIGH (ref 11.5–15.5)
WBC: 6.1 K/uL (ref 4.0–10.5)
WBC: 7.1 K/uL (ref 4.0–10.5)
nRBC: 0.8 % — ABNORMAL HIGH (ref 0.0–0.2)
nRBC: 0.6 % — ABNORMAL HIGH (ref 0.0–0.2)

## 2020-06-08 LAB — PREPARE RBC (CROSSMATCH)

## 2020-06-08 LAB — BASIC METABOLIC PANEL WITH GFR
Anion gap: 8 (ref 5–15)
BUN: 11 mg/dL (ref 8–23)
CO2: 33 mmol/L — ABNORMAL HIGH (ref 22–32)
Calcium: 8.1 mg/dL — ABNORMAL LOW (ref 8.9–10.3)
Chloride: 97 mmol/L — ABNORMAL LOW (ref 98–111)
Creatinine, Ser: 1.43 mg/dL — ABNORMAL HIGH (ref 0.44–1.00)
GFR calc Af Amer: 42 mL/min — ABNORMAL LOW
GFR calc non Af Amer: 36 mL/min — ABNORMAL LOW
Glucose, Bld: 139 mg/dL — ABNORMAL HIGH (ref 70–99)
Potassium: 2.9 mmol/L — ABNORMAL LOW (ref 3.5–5.1)
Sodium: 138 mmol/L (ref 135–145)

## 2020-06-08 LAB — SURGICAL PATHOLOGY

## 2020-06-08 MED ORDER — SODIUM CHLORIDE 0.9% IV SOLUTION: Freq: Once | INTRAVENOUS | Status: AC

## 2020-06-08 MED ORDER — POTASSIUM CHLORIDE CRYS ER 20 MEQ PO TBCR
40.0000 meq | EXTENDED_RELEASE_TABLET | Freq: Two times a day (BID) | ORAL | Status: AC
  Administered 2020-06-08 (×2): 40 meq via ORAL
  Filled 2020-06-08 (×2): qty 2

## 2020-06-08 MED ORDER — SODIUM CHLORIDE 0.9 % IV SOLN
510.0000 mg | Freq: Once | INTRAVENOUS | Status: AC
  Administered 2020-06-08: 510 mg via INTRAVENOUS
  Filled 2020-06-08: qty 17

## 2020-06-08 NOTE — Progress Notes (Signed)
Mobility Specialist - Progress Note   06/08/20 1611  Mobility  Activity  (Cancel)   Pt recently seen by PT, will not see pt today.   Pricilla Handler Mobility Specialist Mobility Specialist Phone: [phone]

## 2020-06-08 NOTE — Progress Notes (Signed)
PROGRESS NOTE    Linda Stevens  UGQ:[id number] DOB: [birth year] DOA: 06/05/2020 PCP: [name], MD  Brief Narrative: Frail 74 year old female with history of chronic diastolic CHF, severe pulmonary hypertension, chronic respiratory failure on 2 L home O2, hypertension, recent diagnosis of paroxysmal atrial fibrillation started on Eliquis a few months ago.  Presented to the emergency room with worsening dyspnea on exertion -She was recently started on prednisone to help with her dyspnea -Work-up in the emergency room noted hemoglobin of 3.1 down from 10.7 in July, Hemoccult was positive, patient denied any overt bleeding -Work-up notable for colonic AVMs which was likely source of bleeding in the setting of  Assessment & Plan:  Severe symptomatic anemia Suspect subacute blood loss, no overt bleeding noted, admitted with hemoglobin of 3.1, down from 10.7 in July -Hemoccult positive, Eliquis held -Gastroenterology consulting, -Underwent EGD and colonoscopy yesterday, concern for esophageal candidiasis, multiple polyps removed from the colon and colonic AVMs noted which is suspected to be the cause of ongoing bleeding in the setting of Eliquis -Transfused 3 units of PRBC this admission, hemoglobin 7.1 this morning -Will transfuse 1 unit more unit of blood and give IV iron given extensive cardiac history -CBC this afternoon and in a.m. -Discharge planning  Acute kidney injury -Secondary to blood loss -Aldactone held -Baseline creatinine around 1.2, 1.4 this morning  Chronic diastolic CHF Severe PAH -Continue tadalafil and ambrisentan -Resume Lasix  COPD/chronic respiratory failure -On 2 L home O2 at baseline, stable, monitor  Paroxysmal atrial fibrillation -Continue amiodarone -Eliquis held, see discussion above  History of intermittent hypotension -On as needed midodrine at baseline  Recently started on steroids -For progressive dyspnea on exertion which I suspect is  secondary to her anemia primarily -Tapered off steroids  DVT prophylaxis: SCDs Code Status: DNR Family Communication: Spouse at bedside Disposition Plan:  Status is: Inpatient  Remains inpatient appropriate because:Inpatient level of care appropriate due to severity of illness   Dispo: The patient is from: Home              Anticipated d/c is to: Home              Anticipated d/c date is: Likely tomorrow              Patient currently is not medically stable to d/c.  Consultants:  [facility] Procedures: EGD  Impression:               - Esophageal plaques were found, consistent with                            candidiasis.                           - 2 cm hiatal hernia.                           - Normal stomach.                           - Normal examined duodenum.                           - No source of GI blood loss on this examination.   colonoscopy  - One 15 mm polyp in the transverse colon, removed  with a cold snare. Resected and retrieved.                           - One 8 mm polyp in the proximal rectum, removed                            with a cold snare. Resected and retrieved.                           - Multiple non-bleeding colonic vascular ectasias                            (see description above). Most likely causing of                            slow GI blood loss in the setting of Eliquis.                           - Diverticulosis in the sigmoid colon.                            - Small internal hemorrhoids.  Antimicrobials:    Subjective: -Feels better, breathing is overall improving  Objective: Vitals:   06/08/20 0910 06/08/20 1137 06/08/20 1152 06/08/20 1210  BP: 110/62 (!) 98/42 105/71 (!) 109/48  Pulse: 87 83  88  Resp:  _0 Temp:  98.5 F (36.9 C) 98.6 F (37 C) 98.4 F (36.9 C)  TempSrc:  Oral Oral Oral  SpO2: 100% 98% 99% 97%  Weight:      Height:        Intake/Output Summary (Last 24 hours) at  06/08/2020 1232 Last data filed at 06/08/2020 0800 Gross per 24 hour  Intake 960 ml  Output --  Net 960 ml   Filed Weights   06/05/20 1746  Weight: 56.2 kg    Examination:  General exam: Chronically ill pleasant female sitting up in bed, AAOx3, no distress HEENT: Positive JVD CVS: S1-S2, regular rate rhythm Lungs: Few basilar rales Abdomen: Soft, nontender, bowel sounds present Remedies: Trace edema Skin: Chronic venous stasis changes Psychiatry: Mood & affect appropriate.     Data Reviewed:   CBC: Recent Labs  Lab 06/05/20 1744 06/06/20 0605 06/07/20 0239 06/08/20 0312  WBC 17.5* 8.0 8.4 6.1  NEUTROABS  --   --  7.1  --   HGB 3.1* 6.0* 8.4* 7.1*  HCT 11.6* 19.8* 26.6* 23.0*  MCV 86.6 88.0 83.1 85.2  PLT 417* 229 170 389*   Basic Metabolic Panel: Recent Labs  Lab 06/05/20 1142 06/06/20 0605 06/08/20 0312  NA 139 138 138  K 4.2 3.9 2.9*  CL 99 99 97*  CO2 29 30 33*  GLUCOSE 119* 86 139*  BUN 27* 30* 11  CREATININE 1.69* 1.80* 1.43*  CALCIUM 8.7* 7.9* 8.1*   GFR: Estimated Creatinine Clearance: 29.4 mL/min (A) (by C-G formula based on SCr of 1.43 mg/dL (H)). Liver Function Tests: No results for input(s): AST, ALT, ALKPHOS, BILITOT, PROT, ALBUMIN in the last 168 hours. No results for input(s): LIPASE, AMYLASE in the last 168 hours. No results for input(s): AMMONIA in the last 168 hours. Coagulation Profile: No results  for input(s): INR, PROTIME in the last 168 hours. Cardiac Enzymes: No results for input(s): CKTOTAL, CKMB, CKMBINDEX, TROPONINI in the last 168 hours. BNP (last 3 results) No results for input(s): PROBNP in the last 8760 hours. HbA1C: No results for input(s): HGBA1C in the last 72 hours. CBG: No results for input(s): GLUCAP in the last 168 hours. Lipid Profile: No results for input(s): CHOL, HDL, LDLCALC, TRIG, CHOLHDL, LDLDIRECT in the last 72 hours. Thyroid Function Tests: No results for input(s): TSH, T4TOTAL, FREET4, T3FREE,  THYROIDAB in the last 72 hours. Anemia Panel: Recent Labs    06/06/20 1804  VITAMINB12 159*  FOLATE 10.1  FERRITIN 29  TIBC 455*  IRON 161  RETICCTPCT 1.7   Urine analysis:    Component Value Date/Time   COLORURINE YELLOW 02/23/2020 1319   APPEARANCEUR HAZY (A) 02/23/2020 1319   LABSPEC 1.019 02/23/2020 1319   PHURINE 5.0 02/23/2020 1319   GLUCOSEU NEGATIVE 02/23/2020 1319   GLUCOSEU NEGATIVE 07/07/2017 1625   HGBUR NEGATIVE 02/23/2020 1319   HGBUR negative 12/22/2007 1139   BILIRUBINUR NEGATIVE 02/23/2020 1319   KETONESUR NEGATIVE 02/23/2020 1319   PROTEINUR NEGATIVE 02/23/2020 1319   UROBILINOGEN 0.2 07/07/2017 1625   NITRITE NEGATIVE 02/23/2020 1319   LEUKOCYTESUR NEGATIVE 02/23/2020 1319   Sepsis Labs: _0 (procalcitonin:4,lacticidven:4)  ) Recent Results (from the past 240 hour(s))  Respiratory Panel by RT PCR (Flu A&B, Covid) - Nasopharyngeal Swab     Status: None   Collection Time: 06/05/20  8:00 PM   Specimen: Nasopharyngeal Swab  Result Value Ref Range Status   SARS Coronavirus 2 by RT PCR NEGATIVE NEGATIVE Final    Comment: (NOTE) SARS-CoV-2 target nucleic acids are NOT DETECTED.  The SARS-CoV-2 RNA is generally detectable in upper respiratoy specimens during the acute phase of infection. The lowest concentration of SARS-CoV-2 viral copies this assay can detect is 131 copies/mL. A negative result does not preclude SARS-Cov-2 infection and should not be used as the sole basis for treatment or other patient management decisions. A negative result may occur with  improper specimen collection/handling, submission of specimen other than nasopharyngeal swab, presence of viral mutation(s) within the areas targeted by this assay, and inadequate number of viral copies (<131 copies/mL). A negative result must be combined with clinical observations, patient history, and epidemiological information. The expected result is Negative.  Fact Sheet for Patients:   PinkCheek.be  Fact Sheet for Healthcare Providers:  GravelBags.it  This test is no t yet approved or cleared by the [place] FDA and  has been authorized for detection and/or diagnosis of SARS-CoV-2 by FDA under an Emergency Use Authorization (EUA). This EUA will remain  in effect (meaning this test can be used) for the duration of the COVID-19 declaration under Section 564(b)(1) of the Act, 21 U.S.C. section 360bbb-3(b)(1), unless the authorization is terminated or revoked sooner.     Influenza A by PCR NEGATIVE NEGATIVE Final   Influenza B by PCR NEGATIVE NEGATIVE Final    Comment: (NOTE) The Xpert Xpress SARS-CoV-2/FLU/RSV assay is intended as an aid in  the diagnosis of influenza from Nasopharyngeal swab specimens and  should not be used as a sole basis for treatment. Nasal washings and  aspirates are unacceptable for Xpert Xpress SARS-CoV-2/FLU/RSV  testing.  Fact Sheet for Patients: PinkCheek.be  Fact Sheet for Healthcare Providers: GravelBags.it  This test is not yet approved or cleared by the [place] FDA and  has been authorized for detection and/or diagnosis of SARS-CoV-2 by  FDA  under an Emergency Use Authorization (EUA). This EUA will remain  in effect (meaning this test can be used) for the duration of the  Covid-19 declaration under Section 564(b)(1) of the Act, 21  U.S.C. section 360bbb-3(b)(1), unless the authorization is  terminated or revoked. Performed at [facility], [place] [address] [place], [place] [address]     Scheduled Meds: . ambrisentan  10 mg Oral Daily  . amiodarone  100 mg Oral Daily  . fluconazole  100 mg Oral Daily  . furosemide  40 mg Oral Daily  . pantoprazole  40 mg Oral Daily  . potassium chloride  40 mEq Oral BID  . tadalafil  40 mg Oral Daily   Continuous Infusions: . ferumoxytol       LOS: 3 days     Time spent: 4mn  PDomenic Polite MD [facility]  06/08/2020, 12:32 PM

## 2020-06-08 NOTE — Progress Notes (Signed)
Physical Therapy Treatment Patient Details Name: Linda Stevens MRN: [id number] DOB: 13-Feb-1946 Today's Date: 06/08/2020    History of Present Illness Pt is 74 y.o. female with medical history significant of severe PAH, dCHF, HTN, recent PAF diagnosis just started on eliquis a couple months ago. Pt presents to ED with c/o gradually worsening SOB over past several weeks time. Pt found to be anemic in ED. Pt underwent EGD and colonoscopy on 06/07/2020.    PT Comments    Pt making good progress.  Her O2 sats were stable on 3 LPM of oxygen.  She required min cues for posture and education on pushing self in order to increase endurance.  Cont POC.    Follow Up Recommendations  Home health PT;Supervision for mobility/OOB     Equipment Recommendations  None recommended by PT    Recommendations for Other Services       Precautions / Restrictions Precautions Precautions: Fall Precaution Comments: monitor SpO2    Mobility  Bed Mobility Overal bed mobility: Needs Assistance Bed Mobility: Supine to Sit;Sit to Supine     Supine to sit: Supervision;HOB elevated Sit to supine: Supervision;HOB elevated      Transfers Overall transfer level: Needs assistance Equipment used: Rolling walker (2 wheeled) Transfers: Sit to/from Stand Sit to Stand: Min guard         General transfer comment: min guard for safety; performed x 2; cues for hand placement on first rep - good carry through 2nd rep  Ambulation/Gait Ambulation/Gait assistance: Supervision Gait Distance (Feet): 100 Feet (100' then 80') Assistive device: Rolling walker (2 wheeled) Gait Pattern/deviations: Step-through pattern Gait velocity: reduced   General Gait Details: Cues for posture; ambulated 100' took seated rest break ~3-4 mins then ambulated 80'   Stairs             Wheelchair Mobility    Modified Rankin (Stroke Patients Only)       Balance Overall balance assessment: Needs  assistance Sitting-balance support: No upper extremity supported;Feet supported Sitting balance-Leahy Scale: Good     Standing balance support: No upper extremity supported Standing balance-Leahy Scale: Fair Standing balance comment: Used RW for ambulation but able to static stand without AD                            Cognition Arousal/Alertness: Awake/alert Behavior During Therapy: Anxious Overall Cognitive Status: Within Functional Limits for tasks assessed                                        Exercises      General Comments General comments (skin integrity, edema, etc.): Pt was on 3 L O2 with O2 sats 94% or >; HR and BP stable.  Pt required encouragment/education on pushing self a little to improve endurance considering O2 sats stable and DOE of 2/4.      Pertinent Vitals/Pain Pain Assessment: No/denies pain    Home Living                      Prior Function            PT Goals (current goals can now be found in the care plan section) Acute Rehab PT Goals Patient Stated Goal: To go home PT Goal Formulation: With patient/family Time For Goal Achievement: 06/21/20 Potential to Achieve Goals: Good Progress towards  PT goals: Progressing toward goals    Frequency    Min 3X/week      PT Plan Current plan remains appropriate    Co-evaluation              AM-PAC PT "6 Clicks" Mobility   Outcome Measure  Help needed turning from your back to your side while in a flat bed without using bedrails?: None Help needed moving from lying on your back to sitting on the side of a flat bed without using bedrails?: None Help needed moving to and from a bed to a chair (including a wheelchair)?: A Little Help needed standing up from a chair using your arms (e.g., wheelchair or bedside chair)?: A Little Help needed to walk in hospital room?: None Help needed climbing 3-5 steps with a railing? : A Little 6 Click Score: 21    End  of Session Equipment Utilized During Treatment: Oxygen;Gait belt Activity Tolerance: Patient tolerated treatment well Patient left: in bed;with call bell/phone within reach;with bed alarm set;with family/visitor present Nurse Communication: Mobility status PT Visit Diagnosis: Muscle weakness (generalized) (M62.81);Other (comment) (cardiopulmonary endurance)     Time: 4383-8184 PT Time Calculation (min) (ACUTE ONLY): 20 min  Charges:  $Gait Training: 8-22 mins                     Abran [name], PT Acute Rehab Services Pager [phone] [facility] [place] 06/08/2020, 4:44 PM

## 2020-06-08 NOTE — TOC Initial Note (Signed)
Transition of Care (TOC) - Initial/Assessment Note  Marvetta Gibbons RN, BSN Transitions of Care Unit 4E- RN Case Manager See Treatment Team for direct phone #    Patient Details  Name: Linda Stevens MRN: [id number] Date of Birth: Jul 08, 1946  Transition of Care [facility]) CM/SW Contact:    Dawayne Patricia, RN Phone Number: 06/08/2020, 3:31 PM  Clinical Narrative:                 Noted HHPT orders placed, CM spoke with pt and spouse at bedside for transition of care needs. Per pt she has all needed DME at home including RW and BSW, home 02 is supplied by [employer] (baseline 4L).  Discussed order for HHPT pt is agreeable states she had therapy back in July but does not remember name of agency- list provided Per CMS guidelines from medicare.gov website with star ratings (copy placed in shadow chart)- on review of epic looks like agency that pt was set up with was [facility]- pt voiced that sounded right and would like to use them again.   Address, phone # including spouse cell # as backup, and PCP all confirmed in Epic.   Call [facility] to [facility] with [facility] for [place] referral- referral has been accepted.   Expected Discharge Plan: [place] Barriers to Discharge: Continued Medical Work up   Patient Goals and CMS Choice Patient states their goals for this hospitalization and ongoing recovery are:: return home and be able to walk aroun with walker CMS Medicare.gov Compare Post Acute Care list provided to:: Patient Choice offered to / list presented to : Patient  Expected Discharge Plan and Services Expected Discharge Plan: [place]   Discharge Planning Services: CM Consult Post Acute Care Choice: [place] arrangements for the past 2 months: Single Family Home                 DME Arranged: N/A DME Agency: NA       HH Arranged: PT HH Agency: [facility] Date [place]: 06/08/20 Time [place]:  1660 Representative spoke with at [place]: [place]  Prior Living Arrangements/Services Living arrangements for the past 2 months: [place] with:: Spouse Patient language and need for interpreter reviewed:: Yes Do you feel safe going back to the place where you live?: Yes      Need for Family Participation in Patient Care: Yes (Comment) Care giver support system in place?: Yes (comment) Current home services: DME (walker, BSC, home 02) Criminal Activity/Legal Involvement Pertinent to Current Situation/Hospitalization: No - Comment as needed  Activities of Daily Living Home Assistive Devices/Equipment: None ADL Screening (condition at time of admission) Patient's cognitive ability adequate to safely complete daily activities?: Yes Is the patient deaf or have difficulty hearing?: No Does the patient have difficulty seeing, even when wearing glasses/contacts?: No Does the patient have difficulty concentrating, remembering, or making decisions?: No Patient able to express need for assistance with ADLs?: No Does the patient have difficulty dressing or bathing?: No Does the patient have difficulty walking or climbing stairs?: No Weakness of Legs: None Weakness of Arms/Hands: None  Permission Sought/Granted Permission sought to share information with : Facility Art therapist granted to share information with : Yes, Verbal Permission Granted     Permission granted to share info w AGENCY: [facility]        Emotional Assessment Appearance:: Appears stated age Attitude/Demeanor/Rapport: Engaged Affect (typically observed): Appropriate  Orientation: : Oriented to Self, Oriented to Place, Oriented to  Time, Oriented to Situation Alcohol / Substance Use: Not Applicable Psych Involvement: No (comment)  Admission diagnosis:  Shortness of breath [R06.02] Acute blood loss anemia [D62] On continuous oral anticoagulation [Z79.01] Anemia, unspecified type  [D64.9] Patient Active Problem List   Diagnosis Date Noted  . Heme positive stool   . Benign neoplasm of transverse colon   . Benign neoplasm of rectum   . Symptomatic anemia 06/05/2020  . GI bleed 06/05/2020  . Acute blood loss anemia 06/05/2020  . AKI (acute kidney injury) ([place]) 06/05/2020  . DNR (do not resuscitate)   . Palliative care by specialist   . Positive blood cultures 02/20/2020  . Community acquired pneumonia 02/19/2020  . PAF (paroxysmal atrial fibrillation) ([place]) 02/19/2020  . Atrial fibrillation with RVR ([place])   . Chronic respiratory failure with hypoxia and hypercapnia ([place]) 07/08/2017  . Pleural effusion, bilateral 07/08/2017  . Chronic respiratory failure with hypoxia ([place]) 10/16/2015  . Hepatic cirrhosis ([place]) 10/15/2015  . Pulmonary artery hypertension ([place])   . Chronic diastolic CHF (congestive heart failure) ([place])   . Acute on chronic respiratory failure with hypoxia ([place])   . COPD GOLD II criteria but 02 dep 24/7    . Severe Pulmonary Hypertension   . Pericardial effusion   . Diastolic dysfunction   . Goals of care, counseling/discussion 05/03/2014  . Mild diastolic dysfunction 82/70/7867  . Acute respiratory failure with hypoxia and hypercapnea 02/26/2014  . Hypokalemia 02/22/2014  . HTN (hypertension) 03/19/2012  . Allergic rhinitis 01/15/2007   PCP:  [name], MD Pharmacy:   [id number] [place], [id number]Phone: [phone]: [phone] [place]Phone: [phone]: [phone] [facility] PontesTransitions of [place]- [address]Phone: [phone]: [address] [address]Phone: [phone]: [phone]    Social Determinants of Health (SDOH) Interventions    Readmission Risk Interventions No flowsheet data found.

## 2020-06-09 LAB — TYPE AND SCREEN
ABO/RH(D): A POS
Antibody Screen: NEGATIVE
Unit division: 0
Unit division: 0
Unit division: 0
Unit division: 0

## 2020-06-09 LAB — BASIC METABOLIC PANEL
Anion gap: 8 (ref 5–15)
BUN: 10 mg/dL (ref 8–23)
CO2: 29 mmol/L (ref 22–32)
Calcium: 7.9 mg/dL — ABNORMAL LOW (ref 8.9–10.3)
Chloride: 102 mmol/L (ref 98–111)
Creatinine, Ser: 1.29 mg/dL — ABNORMAL HIGH (ref 0.44–1.00)
GFR calc Af Amer: 48 mL/min — ABNORMAL LOW (ref >60)
GFR calc non Af Amer: 41 mL/min — ABNORMAL LOW (ref >60)
Glucose, Bld: 87 mg/dL (ref 70–99)
Potassium: 3.5 mmol/L (ref 3.5–5.1)
Sodium: 139 mmol/L (ref 135–145)

## 2020-06-09 LAB — BPAM RBC
Blood Product Expiration Date: 202110162359
Blood Product Expiration Date: 202110162359
Blood Product Expiration Date: 202110282359
Blood Product Expiration Date: 202110282359
ISSUE DATE / TIME: 202109282103
ISSUE DATE / TIME: 202109290210
ISSUE DATE / TIME: 202109291210
ISSUE DATE / TIME: 202110011144
Unit Type and Rh: 6200
Unit Type and Rh: 6200
Unit Type and Rh: 6200
Unit Type and Rh: 6200

## 2020-06-09 LAB — CBC
HCT: 26.5 % — ABNORMAL LOW (ref 36.0–46.0)
Hemoglobin: 8.1 g/dL — ABNORMAL LOW (ref 12.0–15.0)
MCH: 25.6 pg — ABNORMAL LOW (ref 26.0–34.0)
MCHC: 30.6 g/dL (ref 30.0–36.0)
MCV: 83.9 fL (ref 80.0–100.0)
Platelets: 133 10*3/uL — ABNORMAL LOW (ref 150–400)
RBC: 3.16 MIL/uL — ABNORMAL LOW (ref 3.87–5.11)
RDW: 19.3 % — ABNORMAL HIGH (ref 11.5–15.5)
WBC: 6.5 10*3/uL (ref 4.0–10.5)
nRBC: 0.5 % — ABNORMAL HIGH (ref 0.0–0.2)

## 2020-06-09 MED ORDER — FLUCONAZOLE 100 MG PO TABS: 100.0000 mg | ORAL_TABLET | Freq: Every day | ORAL | 0 refills | Status: DC

## 2020-06-09 MED ORDER — FERROUS SULFATE 325 (65 FE) MG PO TBEC: 325.0000 mg | DELAYED_RELEASE_TABLET | Freq: Two times a day (BID) | ORAL | 3 refills | Status: DC

## 2020-06-09 NOTE — Progress Notes (Signed)
Discharge instructions given to patient. IV removed, clean and intact. Medications reviewed, all questions answered. Pt escorted home with husband.  Arletta Bale, RN

## 2020-06-10 ENCOUNTER — Encounter (HOSPITAL_COMMUNITY): Payer: Self-pay | Admitting: Internal Medicine

## 2020-06-11 ENCOUNTER — Telehealth: Payer: Self-pay

## 2020-06-11 NOTE — Discharge Summary (Signed)
Physician Discharge Summary  TRENESHA ALCAIDE WVP:[id number] DOB: 1945/09/14 DOA: 06/05/2020  PCP: [name], MD  Admit date: 06/05/2020 Discharge date: 06/10/2020  Time spent: 35 minutes  Recommendations for Outpatient Follow-up:  1. PCP in 1 week, please check CBC at follow-up 2. cardiology Dr. Haroldine Laws in 2 to 3 weeks   Discharge Diagnoses:  Principal Problem:   Severe symptomatic anemia   lower GI bleed   colonic AVMs and polyps   esophageal candidiasis   Chronic diastolic CHF (congestive heart failure) (HCC)   COPD GOLD II criteria but 02 dep 24/7    Severe Pulmonary Hypertension   PAF (paroxysmal atrial fibrillation) (HCC)   GI bleed   Acute blood loss anemia   AKI (acute kidney injury) ([facility])   Heme positive stool   Benign neoplasm of transverse colon   Benign neoplasm of rectum   DNR  Discharge Condition: Improved  Diet recommendation: Low-sodium, heart healthy  Filed Weights   06/05/20 1746  Weight: 56.2 kg    History of present illness:  Frail 74 year old female with history of chronic diastolic CHF, severe pulmonary hypertension, chronic respiratory failure on 2 L home O2, hypertension, recent diagnosis of paroxysmal atrial fibrillation started on Eliquis a few months ago.  Presented to the emergency room with worsening dyspnea on exertion -She was recently started on prednisone to help with her dyspnea -Work-up in the emergency room noted hemoglobin of 3.1 down from 10.7 in July, Hemoccult was positive, patient denied any overt bleeding  Hospital Course:   Severe symptomatic anemia -Suspect subacute blood loss, no overt bleeding noted, admitted with hemoglobin of 3.1, down from 10.7 in July with positive Hemoccult -Eliquis discontinued, gastroenterology consulted -Underwent EGD and colonoscopy 9/30 , concern for esophageal candidiasis, multiple polyps removed from the colon and colonic AVMs noted which is suspected to be the cause of ongoing bleeding  in the setting of Eliquis -transfused a total of 4 units of PRBC this admission and given IV iron -hemoglobin 8.1 at discharge, no overt bleeding at this time -oral iron added to home regimen and Eliquis discontinued -follow-up with PCP in 1 week, check CBC at follow-up  Acute kidney injury -Secondary to blood loss -Baseline creatinine around 1.2, improved and stable now  Chronic diastolic CHF Severe PAH -Continue tadalafil and ambrisentan -Resume Lasix  COPD/chronic respiratory failure -On 2 L home O2 at baseline, stable, monitor  Paroxysmal atrial fibrillation -Continue amiodarone -Eliquis stopped due to severity of blood loss anemia, see discussion above  History of intermittent hypotension -On as needed midodrine at baseline  Recently started on steroids -For progressive dyspnea on exertion which I suspect is secondary to her anemia primarily -Tapered off steroids  Code Status: DNR  Discharge Exam: Vitals:   06/09/20 0458 06/09/20 0752  BP: (!) 111/56 (!) 107/58  Pulse:  81  Resp: 20 19  Temp: 98.5 F (36.9 C) 97.8 F (36.6 C)  SpO2: 98% 99%    General: AAOx3 Cardiovascular: S1S2/RRR Respiratory: Decreased breath sounds to bases, otherwise  Discharge Instructions   Discharge Instructions    Diet - low sodium heart healthy   Complete by: As directed    Increase activity slowly   Complete by: As directed      Allergies as of 06/09/2020      Reactions   Amoxicillin-pot Clavulanate    REACTION: gi upset Has patient had a PCN reaction causing immediate rash, facial/tongue/throat swelling, SOB or lightheadedness with hypotension: No Has patient had a PCN  reaction causing severe rash involving mucus membranes or skin necrosis: No Has patient had a PCN reaction that required hospitalization : No Has patient had a PCN reaction occurring within the last 10 years: No If all of the above answers are "NO", then may proceed with Cephalosporin use.    Cefdinir    REACTION: gi  upset   Moxifloxacin    REACTION: rash   Singulair [montelukast Sodium]    Itching      Medication List    STOP taking these medications   apixaban 5 MG Tabs tablet Commonly known as: ELIQUIS   Eliquis 5 MG Tabs tablet Generic drug: apixaban   midodrine 2.5 MG tablet Commonly known as: PROAMATINE   predniSONE 20 MG tablet Commonly known as: DELTASONE     TAKE these medications   acetaminophen 325 MG tablet Commonly known as: TYLENOL Take 325 mg by mouth at bedtime as needed for moderate pain or headache.   albuterol 108 (90 Base) MCG/ACT inhaler Commonly known as: VENTOLIN HFA Inhale 2 puffs every 6 (six) hours as needed into the lungs for wheezing or shortness of breath.   ambrisentan 10 MG tablet Commonly known as: LETAIRIS TAKE 1 TABLET BY MOUTH DAILY. DO NOT HANDLE IF PREGNANT. DO NOT SPLIT, CRUSH, OR CHEW. AVOID INHALATION AND CONTACT WITH SKIN OR EYES . CALL [phone] TO REFILL. What changed: See the new instructions.   amiodarone 100 MG tablet Commonly known as: PACERONE Take 1 tablet (100 mg total) by mouth daily.   aspirin 81 MG EC tablet Take 1 tablet (81 mg total) by mouth daily. Notes to patient: Take as you were at home.   colchicine 0.6 MG tablet TAKE 1/2 A TABLET BY MOUTH TWICE DAILY What changed: See the new instructions. Notes to patient: Take as you were at home.   ferrous sulfate 325 (65 FE) MG EC tablet Take 1 tablet (325 mg total) by mouth 2 (two) times daily with a meal.   fluconazole 100 MG tablet Commonly known as: DIFLUCAN Take 1 tablet (100 mg total) by mouth daily.   furosemide 40 MG tablet Commonly known as: LASIX Take 1 tablet (40 mg total) by mouth daily.   OXYGEN 2 to 2lpm 24/7   pantoprazole 40 MG tablet Commonly known as: PROTONIX TAKE 1 TABLET BY MOUTH EVERY DAY   potassium chloride SA 20 MEQ tablet Commonly known as: KLOR-CON Take 1 tablet (20 mEq total) by mouth daily.    spironolactone 25 MG tablet Commonly known as: ALDACTONE Take 12.5 mg by mouth daily. Notes to patient: Take as you were at home.   tadalafil (PAH) 20 MG tablet Commonly known as: ADCIRCA TAKE 2 TABLETS DAILY Generic for Adcirca What changed: See the new instructions.      Allergies  Allergen Reactions  . Amoxicillin-Pot Clavulanate     REACTION: gi upset Has patient had a PCN reaction causing immediate rash, facial/tongue/throat swelling, SOB or lightheadedness with hypotension: No Has patient had a PCN reaction causing severe rash involving mucus membranes or skin necrosis: No Has patient had a PCN reaction that required hospitalization : No Has patient had a PCN reaction occurring within the last 10 years: No If all of the above answers are "NO", then may proceed with Cephalosporin use.   . Cefdinir     REACTION: gi  upset  . Moxifloxacin     REACTION: rash  . Singulair [Montelukast Sodium]     Itching     Follow-up Information  [facility].   Specialty: Home Health Services Why: Home Health Physical Therapy arranged- they will contact you to set up home visits Contact information: [address] [place] [address] [phone]        [name], MD. Schedule an appointment as soon as possible for a visit in 1 week(s).   Specialty: Family Medicine Contact information: [place] [address] [phone]                The results of significant diagnostics from this hospitalization (including imaging, microbiology, ancillary and laboratory) are listed below for reference.    Significant Diagnostic Studies: DG Chest 2 View  Result Date: 06/05/2020 CLINICAL DATA:  Persistent shortness of breath EXAM: CHEST - 2 VIEW COMPARISON:  02/19/2020 FINDINGS: Cardiac shadow remains enlarged. Aortic calcifications are again seen. The lungs are hyperinflated. The left lung is clear.  Persistent consolidation in the right lower lobe with associated effusion is noted. No new focal abnormality is seen. IMPRESSION: Persistent right lower lobe consolidation with associated effusion. The overall appearance is similar to that seen June of 2021. Electronically Signed   By: Inez Catalina M.D.   On: 06/05/2020 11:42    Microbiology: Recent Results (from the past 240 hour(s))  Respiratory Panel by RT PCR (Flu A&B, Covid) - Nasopharyngeal Swab     Status: None   Collection Time: 06/05/20  8:00 PM   Specimen: Nasopharyngeal Swab  Result Value Ref Range Status   SARS Coronavirus 2 by RT PCR NEGATIVE NEGATIVE Final    Comment: (NOTE) SARS-CoV-2 target nucleic acids are NOT DETECTED.  The SARS-CoV-2 RNA is generally detectable in upper respiratoy specimens during the acute phase of infection. The lowest concentration of SARS-CoV-2 viral copies this assay can detect is 131 copies/mL. A negative result does not preclude SARS-Cov-2 infection and should not be used as the sole basis for treatment or other patient management decisions. A negative result may occur with  improper specimen collection/handling, submission of specimen other than nasopharyngeal swab, presence of viral mutation(s) within the areas targeted by this assay, and inadequate number of viral copies (<131 copies/mL). A negative result must be combined with clinical observations, patient history, and epidemiological information. The expected result is Negative.  Fact Sheet for Patients:  PinkCheek.be  Fact Sheet for Healthcare Providers:  GravelBags.it  This test is no t yet approved or cleared by the [place] FDA and  has been authorized for detection and/or diagnosis of SARS-CoV-2 by FDA under an Emergency Use Authorization (EUA). This EUA will remain  in effect (meaning this test can be used) for the duration of the COVID-19 declaration under Section  564(b)(1) of the Act, 21 U.S.C. section 360bbb-3(b)(1), unless the authorization is terminated or revoked sooner.     Influenza A by PCR NEGATIVE NEGATIVE Final   Influenza B by PCR NEGATIVE NEGATIVE Final    Comment: (NOTE) The Xpert Xpress SARS-CoV-2/FLU/RSV assay is intended as an aid in  the diagnosis of influenza from Nasopharyngeal swab specimens and  should not be used as a sole basis for treatment. Nasal washings and  aspirates are unacceptable for Xpert Xpress SARS-CoV-2/FLU/RSV  testing.  Fact Sheet for Patients: PinkCheek.be  Fact Sheet for Healthcare Providers: GravelBags.it  This test is not yet approved or cleared by the [place] FDA and  has been authorized for detection and/or diagnosis of SARS-CoV-2 by  FDA under an Emergency Use  Authorization (EUA). This EUA will remain  in effect (meaning this test can be used) for the duration of the  Covid-19 declaration under Section 564(b)(1) of the Act, 21  U.S.C. section 360bbb-3(b)(1), unless the authorization is  terminated or revoked. Performed at [facility], [place] [address] [place], [place] [address]      Labs: Basic Metabolic Panel: Recent Labs  Lab 06/05/20 1142 06/06/20 0605 06/08/20 0312 06/09/20 0202  NA 139 138 138 139  K 4.2 3.9 2.9* 3.5  CL 99 99 97* 102  CO2 29 30 33* 29  GLUCOSE 119* 86 139* 87  BUN 27* 30* 11 10  CREATININE 1.69* 1.80* 1.43* 1.29*  CALCIUM 8.7* 7.9* 8.1* 7.9*   Liver Function Tests: No results for input(s): AST, ALT, ALKPHOS, BILITOT, PROT, ALBUMIN in the last 168 hours. No results for input(s): LIPASE, AMYLASE in the last 168 hours. No results for input(s): AMMONIA in the last 168 hours. CBC: Recent Labs  Lab 06/06/20 0605 06/07/20 0239 06/08/20 0312 06/08/20 1822 06/09/20 0202  WBC 8.0 8.4 6.1 7.1 6.5  NEUTROABS  --  7.1  --   --   --   HGB 6.0* 8.4* 7.1* 7.9* 8.1*  HCT 19.8* 26.6* 23.0* 26.2*  26.5*  MCV 88.0 83.1 85.2 82.6 83.9  PLT 229 170 145* 123* 133*   Cardiac Enzymes: No results for input(s): CKTOTAL, CKMB, CKMBINDEX, TROPONINI in the last 168 hours. BNP: BNP (last 3 results) Recent Labs    12/09/19 1239 02/19/20 0405  BNP 324.2* 164.0*    ProBNP (last 3 results) No results for input(s): PROBNP in the last 8760 hours.  CBG: No results for input(s): GLUCAP in the last 168 hours.     Signed:  Domenic Polite MD.  [facility] 06/11/2020, 3:14 PM

## 2020-06-11 NOTE — Telephone Encounter (Signed)
1st attempt- Left message on voicemail to return my call. Need to complete TCM and schedule hospital follow up visit.

## 2020-06-12 DIAGNOSIS — I50810 Right heart failure, unspecified: Secondary | ICD-10-CM | POA: Diagnosis not present

## 2020-06-12 DIAGNOSIS — I130 Hypertensive heart and chronic kidney disease with heart failure and stage 1 through stage 4 chronic kidney disease, or unspecified chronic kidney disease: Secondary | ICD-10-CM | POA: Diagnosis not present

## 2020-06-12 DIAGNOSIS — N1831 Chronic kidney disease, stage 3a: Secondary | ICD-10-CM | POA: Diagnosis not present

## 2020-06-12 DIAGNOSIS — I5032 Chronic diastolic (congestive) heart failure: Secondary | ICD-10-CM | POA: Diagnosis not present

## 2020-06-12 DIAGNOSIS — I480 Paroxysmal atrial fibrillation: Secondary | ICD-10-CM | POA: Diagnosis not present

## 2020-06-12 DIAGNOSIS — I2721 Secondary pulmonary arterial hypertension: Secondary | ICD-10-CM | POA: Diagnosis not present

## 2020-06-12 DIAGNOSIS — J439 Emphysema, unspecified: Secondary | ICD-10-CM | POA: Diagnosis not present

## 2020-06-12 DIAGNOSIS — D649 Anemia, unspecified: Secondary | ICD-10-CM

## 2020-06-12 DIAGNOSIS — D6489 Other specified anemias: Secondary | ICD-10-CM | POA: Diagnosis not present

## 2020-06-12 DIAGNOSIS — J9611 Chronic respiratory failure with hypoxia: Secondary | ICD-10-CM | POA: Diagnosis not present

## 2020-06-12 DIAGNOSIS — M1990 Unspecified osteoarthritis, unspecified site: Secondary | ICD-10-CM | POA: Diagnosis not present

## 2020-06-12 HISTORY — DX: Anemia, unspecified: D64.9

## 2020-06-12 NOTE — Telephone Encounter (Signed)
Transition Care Management Follow-up Telephone Call  Date of discharge and from where: [facility]/09/[facility]   How have you been since you were released from the hospital? Patient is doing good. No complaints at this time.   Any questions or concerns? No  Items Reviewed:  Did the pt receive and understand the discharge instructions provided? Yes   Medications obtained and verified? Yes   Any new allergies since your discharge? No   Dietary orders reviewed? Yes  Do you have support at home? Yes   Functional Questionnaire: (I = Independent and D = Dependent) ADLs: I  Bathing/Dressing- I  Meal Prep- I  Eating- I  Maintaining continence- I  Transferring/Ambulation- I  Managing Meds- I  Follow up appointments reviewed:   PCP Hospital f/u appt confirmed? Yes  Scheduled to see Dr. Diona Browner on 06/19/2020 @ 2:20 pm.  [facility] f/u appt confirmed? N/A   Are transportation arrangements needed? No   If their condition worsens, is the pt aware to call PCP or go to the Emergency Dept.? Yes  Was the patient provided with contact information for the PCP's office or ED? Yes  Was to pt encouraged to call back with questions or concerns? Yes

## 2020-06-13 ENCOUNTER — Encounter: Payer: Self-pay | Admitting: Internal Medicine

## 2020-06-19 ENCOUNTER — Other Ambulatory Visit: Payer: Self-pay

## 2020-06-19 ENCOUNTER — Encounter: Payer: Self-pay | Admitting: Family Medicine

## 2020-06-19 ENCOUNTER — Ambulatory Visit (INDEPENDENT_AMBULATORY_CARE_PROVIDER_SITE_OTHER): Payer: Medicare HMO | Admitting: Family Medicine

## 2020-06-19 VITALS — BP 90/50 | HR 86 | Temp 97.5°F | Ht 63.5 in | Wt 128.0 lb

## 2020-06-19 DIAGNOSIS — K922 Gastrointestinal hemorrhage, unspecified: Secondary | ICD-10-CM | POA: Diagnosis not present

## 2020-06-19 DIAGNOSIS — I4891 Unspecified atrial fibrillation: Secondary | ICD-10-CM | POA: Diagnosis not present

## 2020-06-19 DIAGNOSIS — I9589 Other hypotension: Secondary | ICD-10-CM

## 2020-06-19 DIAGNOSIS — Z9981 Dependence on supplemental oxygen: Secondary | ICD-10-CM

## 2020-06-19 DIAGNOSIS — J439 Emphysema, unspecified: Secondary | ICD-10-CM | POA: Diagnosis not present

## 2020-06-19 DIAGNOSIS — M1990 Unspecified osteoarthritis, unspecified site: Secondary | ICD-10-CM | POA: Diagnosis not present

## 2020-06-19 DIAGNOSIS — J22 Unspecified acute lower respiratory infection: Secondary | ICD-10-CM

## 2020-06-19 DIAGNOSIS — I480 Paroxysmal atrial fibrillation: Secondary | ICD-10-CM | POA: Diagnosis not present

## 2020-06-19 DIAGNOSIS — I5032 Chronic diastolic (congestive) heart failure: Secondary | ICD-10-CM

## 2020-06-19 DIAGNOSIS — D6489 Other specified anemias: Secondary | ICD-10-CM | POA: Diagnosis not present

## 2020-06-19 DIAGNOSIS — E785 Hyperlipidemia, unspecified: Secondary | ICD-10-CM

## 2020-06-19 DIAGNOSIS — I50810 Right heart failure, unspecified: Secondary | ICD-10-CM | POA: Diagnosis not present

## 2020-06-19 DIAGNOSIS — Z7982 Long term (current) use of aspirin: Secondary | ICD-10-CM

## 2020-06-19 DIAGNOSIS — J45909 Unspecified asthma, uncomplicated: Secondary | ICD-10-CM

## 2020-06-19 DIAGNOSIS — N1831 Chronic kidney disease, stage 3a: Secondary | ICD-10-CM | POA: Diagnosis not present

## 2020-06-19 DIAGNOSIS — Z87891 Personal history of nicotine dependence: Secondary | ICD-10-CM

## 2020-06-19 DIAGNOSIS — D649 Anemia, unspecified: Secondary | ICD-10-CM

## 2020-06-19 DIAGNOSIS — I872 Venous insufficiency (chronic) (peripheral): Secondary | ICD-10-CM

## 2020-06-19 DIAGNOSIS — Z23 Encounter for immunization: Secondary | ICD-10-CM | POA: Diagnosis not present

## 2020-06-19 DIAGNOSIS — I130 Hypertensive heart and chronic kidney disease with heart failure and stage 1 through stage 4 chronic kidney disease, or unspecified chronic kidney disease: Secondary | ICD-10-CM | POA: Diagnosis not present

## 2020-06-19 DIAGNOSIS — I10 Essential (primary) hypertension: Secondary | ICD-10-CM

## 2020-06-19 DIAGNOSIS — I2721 Secondary pulmonary arterial hypertension: Secondary | ICD-10-CM | POA: Diagnosis not present

## 2020-06-19 DIAGNOSIS — J9611 Chronic respiratory failure with hypoxia: Secondary | ICD-10-CM | POA: Diagnosis not present

## 2020-06-19 DIAGNOSIS — Z9181 History of falling: Secondary | ICD-10-CM

## 2020-06-19 MED ORDER — ALBUTEROL SULFATE HFA 108 (90 BASE) MCG/ACT IN AERS: 2.0000 | INHALATION_SPRAY | Freq: Four times a day (QID) | RESPIRATORY_TRACT | 0 refills | Status: DC | PRN

## 2020-06-19 NOTE — Patient Instructions (Addendum)
Discuss Eliquis with Dr. Maxie Better at follow up.  Please stop at the lab to have labs drawn.  Elevate feet and wear compression hose for peripheral swelling.  If continued you can take an extra 1/2 tab of lasix, but only if BP not as low as today.

## 2020-06-19 NOTE — Progress Notes (Signed)
Chief Complaint  Patient presents with  . Hospitalization Follow-up    History of Present Illness: HPI    74 year old female with history of chronic diastolic CHF, severe pulmonary hypertension, chronic respiratory failure on 2 L home O2, hypertension, recent diagnosis of paroxysmal atrial fibrillation  Who started on Eliquis a few months ago presents for hosptial follow up following lower GI bleed  From AVM and resulting anemia   She was admitted 06/05/2020  Discharged 06/10/2020   Hospital Course Copied as below:  Severe symptomatic anemia -Suspect subacute blood loss, no overt bleeding noted, admitted with hemoglobin of 3.1, down from 10.7 in July with positive Hemoccult -Eliquis discontinued, gastroenterology consulted. -Underwent EGD and colonoscopy 9/30, concern for esophageal candidiasis, multiple polyps removed from the colon and colonic AVMs noted which is suspected to be the cause of ongoing bleeding in the setting of Eliquis -transfused a total of 4 units of PRBC this admission and given IV iron -hemoglobin 8.1 at discharge, no overt bleeding at this time -oral iron added to home regimen and Eliquis discontinued -follow-up with PCP in 1 week, check CBC at follow-up  Acute kidney injury -Secondary to blood loss -Baseline creatinine around 1.2, improved and stable now  Chronic diastolic CHF Severe PAH -Continue tadalafil and ambrisentan -Resume Lasix  COPD/chronic respiratory failure -On 2 L home O2 at baseline, stable, monitor  Paroxysmal atrial fibrillation -Continue amiodarone -Eliquis stopped due to severity of blood loss anemia, see discussion above  History of intermittent hypotension -On as needed midodrine at baseline  Recently started on steroids -For progressive dyspnea on exertion which I suspect is secondary to her anemia primarily -Tapered off steroids    Since discharge she has noted increase in swelling on left leg in last 24 hours. No  calf pain.  She has not noted further blood in stool... stool is black from iron.  She is tolerating iron 325 mg BID.  She remains tired.  She does not want to restart Eliquis ever.  She still has poor appetite..   Had SE to midodrine.. cannot take.. no dizziness with  Lower BPs. Fairly stable.  Drinking 2 ensure a day. BP Readings from Last 3 Encounters:  06/19/20 (!) 90/50  06/09/20 (!) 107/58  04/05/20 103/65    This visit occurred during the SARS-CoV-2 public health emergency.  Safety protocols were in place, including screening questions prior to the visit, additional usage of staff PPE, and extensive cleaning of exam room while observing appropriate contact time as indicated for disinfecting solutions.   COVID 19 screen:  No recent travel or known exposure to COVID19 The patient denies respiratory symptoms of COVID 19 at this time. The importance of social distancing was discussed today.     Review of Systems  Constitutional: Negative for chills and fever.  HENT: Negative for congestion and ear pain.   Eyes: Negative for pain and redness.  Respiratory: Negative for cough and shortness of breath.   Cardiovascular: Negative for chest pain, palpitations and leg swelling.  Gastrointestinal: Negative for abdominal pain, blood in stool, constipation, diarrhea, nausea and vomiting.  Genitourinary: Negative for dysuria.  Musculoskeletal: Negative for falls and myalgias.  Skin: Negative for rash.  Neurological: Negative for dizziness.  Psychiatric/Behavioral: Negative for depression. The patient is not nervous/anxious.       Past Medical History:  Diagnosis Date  . Acute respiratory failure with hypoxia and hypercapnea 02/26/2014  . Allergic rhinitis due to pollen   . Allergy   .  Arthritis   . Asthma   . CHF (congestive heart failure) ([facility])   . COPD (chronic obstructive pulmonary disease) ([facility])   . Diastolic dysfunction    a. 02/2014 Echo: EF 65-70%, Gr 1 DD, RVH;  b.  09/2015 Echo: EF 55%, Gr 1 DD.  [name] Emphysema of lung ([place] [facility])   . H/O seasonal allergies   . History of chicken pox   . History of tobacco abuse    a. Quit 2015.  [name] History of UTI   . Hypertension   . Hypertensive heart disease   . Lower extremity edema   . Pericardial effusion    a. 09/2015 Echo: Mod eff w/o tamponade.  . Right heart failure with reduced right ventricular function ([facility])    a. 09/2015 Echo: EF 55%, Gr 1 DD, D shaped IV septum, sev dil RV with mod reduced fxn, mod dil RA, RV-RA grad 41mHg, PASP 828mg, mod pericard eff w/o tamponade.  . Severe Pulmonary Hypertension    a. 09/2015 Echo: PASP 8748m.    reports that she quit smoking about 6 years ago. Her smoking use included cigarettes. She has a 40.00 pack-year smoking history. She has never used smokeless tobacco. She reports that she does not drink alcohol and does not use drugs.   Current Outpatient Medications:  .  acetaminophen (TYLENOL) 325 MG tablet, Take 325 mg by mouth at bedtime as needed for moderate pain or headache. , Disp: , Rfl:  .  albuterol (PROVENTIL HFA;VENTOLIN HFA) 108 (90 Base) MCG/ACT inhaler, Inhale 2 puffs every 6 (six) hours as needed into the lungs for wheezing or shortness of breath., Disp: 1 Inhaler, Rfl: 11 .  ambrisentan (LETAIRIS) 10 MG tablet, TAKE 1 TABLET BY MOUTH DAILY. DO NOT HANDLE IF PREGNANT. DO NOT SPLIT, CRUSH, OR CHEW. AVOID INHALATION AND CONTACT WITH SKIN OR EYES . CALL ([phone] REFILL., Disp: 90 tablet, Rfl: 3 .  amiodarone (PACERONE) 100 MG tablet, Take 1 tablet (100 mg total) by mouth daily., Disp: 30 tablet, Rfl: 6 .  aspirin EC 81 MG EC tablet, Take 1 tablet (81 mg total) by mouth daily., Disp: 30 tablet, Rfl: 0 .  colchicine 0.6 MG tablet, TAKE 1/2 A TABLET BY MOUTH TWICE DAILY, Disp: 30 tablet, Rfl: 1 .  ferrous sulfate 325 (65 FE) MG EC tablet, Take 1 tablet (325 mg total) by mouth 2 (two) times daily with a meal., Disp: 60 tablet, Rfl: 3 .  fluconazole (DIFLUCAN) 100 MG  tablet, Take 1 tablet (100 mg total) by mouth daily., Disp: 7 tablet, Rfl: 0 .  furosemide (LASIX) 40 MG tablet, Take 1 tablet (40 mg total) by mouth daily., Disp: 30 tablet, Rfl: 1 .  OXYGEN, 2 to 2lpm 24/7, Disp: , Rfl:  .  pantoprazole (PROTONIX) 40 MG tablet, TAKE 1 TABLET BY MOUTH EVERY DAY, Disp: 90 tablet, Rfl: 3 .  potassium chloride SA (KLOR-CON) 20 MEQ tablet, Take 1 tablet (20 mEq total) by mouth daily., Disp: 90 tablet, Rfl: 1 .  spironolactone (ALDACTONE) 25 MG tablet, Take 12.5 mg by mouth daily., Disp: , Rfl:  .  tadalafil, PAH, (ADCIRCA) 20 MG tablet, TAKE 2 TABLETS DAILY Generic for Adcirca, Disp: 180 tablet, Rfl: 3   Observations/Objective: Blood pressure (!) 90/50, pulse 86, temperature (!) 97.5 F (36.4 C), temperature source Temporal, height 5' 3.5" (1.613 m), weight 128 lb (58.1 kg), SpO2 99 %.  Physical Exam Constitutional:      General: She is not in acute distress.  Appearance: Normal appearance. She is well-developed. She is not ill-appearing or toxic-appearing.  HENT:     Head: Normocephalic.     Right Ear: Hearing, tympanic membrane, ear canal and external ear normal. Tympanic membrane is not erythematous, retracted or bulging.     Left Ear: Hearing, tympanic membrane, ear canal and external ear normal. Tympanic membrane is not erythematous, retracted or bulging.     Nose: No mucosal edema or rhinorrhea.     Right Sinus: No maxillary sinus tenderness or frontal sinus tenderness.     Left Sinus: No maxillary sinus tenderness or frontal sinus tenderness.     Mouth/Throat:     Pharynx: Uvula midline.  Eyes:     General: Lids are normal. Lids are everted, no foreign bodies appreciated.     Conjunctiva/sclera: Conjunctivae normal.     Pupils: Pupils are equal, round, and reactive to light.  Neck:     Thyroid: No thyroid mass or thyromegaly.     Vascular: No carotid bruit.     Trachea: Trachea normal.  Cardiovascular:     Rate and Rhythm: Normal rate and  regular rhythm.     Pulses: Normal pulses.     Heart sounds: Normal heart sounds, S1 normal and S2 normal. No murmur heard.  No friction rub. No gallop.   Pulmonary:     Effort: Pulmonary effort is normal. No tachypnea or respiratory distress.     Breath sounds: Wheezing present. No decreased breath sounds, rhonchi or rales.  Abdominal:     General: Bowel sounds are normal.     Palpations: Abdomen is soft.     Tenderness: There is no abdominal tenderness.  Musculoskeletal:     Cervical back: Normal range of motion and neck supple.     Right lower leg: 1+ Pitting Edema present.     Left lower leg: 1+ Pitting Edema present.  Skin:    General: Skin is warm and dry.     Findings: No rash.  Neurological:     Mental Status: She is alert.  Psychiatric:        Mood and Affect: Mood is not anxious or depressed.        Speech: Speech normal.        Behavior: Behavior normal. Behavior is cooperative.        Thought Content: Thought content normal.        Judgment: Judgment normal.      Assessment and Plan GI bleed secondary to colonic AVMs  No further bleeding noted since discharge.  Off Eliquis... does not wish to ever restart. Due fore re-eval with cbc.   Symptomatic anemia Due for re-eval. On iron.  Chronic diastolic CHF (congestive heart failure) (HCC) Elevate feet and wear compression hose for peripheral swelling.  If continued you can take an extra 1/2 tab of lasix, but only if BP not as low as today.   HTN (hypertension)  Low normal on current regimen.  PAF (paroxysmal atrial fibrillation) ([place]) Discuss restart vs hold Eliquis given life threatening bleeding at recent hospitalization. Remain off for now.      Eliezer Lofts, MD

## 2020-06-20 DIAGNOSIS — I5032 Chronic diastolic (congestive) heart failure: Secondary | ICD-10-CM | POA: Diagnosis not present

## 2020-06-20 DIAGNOSIS — I130 Hypertensive heart and chronic kidney disease with heart failure and stage 1 through stage 4 chronic kidney disease, or unspecified chronic kidney disease: Secondary | ICD-10-CM | POA: Diagnosis not present

## 2020-06-20 DIAGNOSIS — J9611 Chronic respiratory failure with hypoxia: Secondary | ICD-10-CM | POA: Diagnosis not present

## 2020-06-20 DIAGNOSIS — M1990 Unspecified osteoarthritis, unspecified site: Secondary | ICD-10-CM | POA: Diagnosis not present

## 2020-06-20 DIAGNOSIS — I480 Paroxysmal atrial fibrillation: Secondary | ICD-10-CM | POA: Diagnosis not present

## 2020-06-20 DIAGNOSIS — I50810 Right heart failure, unspecified: Secondary | ICD-10-CM | POA: Diagnosis not present

## 2020-06-20 DIAGNOSIS — J439 Emphysema, unspecified: Secondary | ICD-10-CM | POA: Diagnosis not present

## 2020-06-20 DIAGNOSIS — I2721 Secondary pulmonary arterial hypertension: Secondary | ICD-10-CM | POA: Diagnosis not present

## 2020-06-20 DIAGNOSIS — N1831 Chronic kidney disease, stage 3a: Secondary | ICD-10-CM | POA: Diagnosis not present

## 2020-06-20 DIAGNOSIS — D6489 Other specified anemias: Secondary | ICD-10-CM | POA: Diagnosis not present

## 2020-06-20 LAB — IBC + FERRITIN
Ferritin: 807.6 ng/mL — ABNORMAL HIGH (ref 10.0–291.0)
Iron: 59 ug/dL (ref 42–145)
Saturation Ratios: 23.9 % (ref 20.0–50.0)
Transferrin: 176 mg/dL — ABNORMAL LOW (ref 212.0–360.0)

## 2020-06-20 LAB — CBC WITH DIFFERENTIAL/PLATELET
Basophils Absolute: 0.1 10*3/uL (ref 0.0–0.1)
Basophils Relative: 1.4 % (ref 0.0–3.0)
Eosinophils Absolute: 0.1 10*3/uL (ref 0.0–0.7)
Eosinophils Relative: 2.4 % (ref 0.0–5.0)
HCT: 34.1 % — ABNORMAL LOW (ref 36.0–46.0)
Hemoglobin: 10.6 g/dL — ABNORMAL LOW (ref 12.0–15.0)
Lymphocytes Relative: 9 % — ABNORMAL LOW (ref 12.0–46.0)
Lymphs Abs: 0.5 10*3/uL — ABNORMAL LOW (ref 0.7–4.0)
MCHC: 31 g/dL (ref 30.0–36.0)
MCV: 87.9 fl (ref 78.0–100.0)
Monocytes Absolute: 0.7 10*3/uL (ref 0.1–1.0)
Monocytes Relative: 12.9 % — ABNORMAL HIGH (ref 3.0–12.0)
Neutro Abs: 4.2 10*3/uL (ref 1.4–7.7)
Neutrophils Relative %: 74.3 % (ref 43.0–77.0)
Platelets: 198 10*3/uL (ref 150.0–400.0)
RBC: 3.88 Mil/uL (ref 3.87–5.11)
RDW: 26.5 % — ABNORMAL HIGH (ref 11.5–15.5)
WBC: 5.6 10*3/uL (ref 4.0–10.5)

## 2020-06-20 LAB — BASIC METABOLIC PANEL
BUN: 19 mg/dL (ref 6–23)
CO2: 35 mEq/L — ABNORMAL HIGH (ref 19–32)
Calcium: 8.2 mg/dL — ABNORMAL LOW (ref 8.4–10.5)
Chloride: 96 mEq/L (ref 96–112)
Creatinine, Ser: 1.22 mg/dL — ABNORMAL HIGH (ref 0.40–1.20)
GFR: 44.18 mL/min — ABNORMAL LOW (ref >60.00)
Glucose, Bld: 84 mg/dL (ref 70–99)
Potassium: 4.3 mEq/L (ref 3.5–5.1)
Sodium: 138 mEq/L (ref 135–145)

## 2020-06-22 ENCOUNTER — Other Ambulatory Visit: Payer: Self-pay | Admitting: Family Medicine

## 2020-06-23 ENCOUNTER — Other Ambulatory Visit (HOSPITAL_COMMUNITY): Payer: Self-pay | Admitting: Internal Medicine

## 2020-06-25 ENCOUNTER — Telehealth (HOSPITAL_COMMUNITY): Payer: Self-pay | Admitting: *Deleted

## 2020-06-25 NOTE — Telephone Encounter (Signed)
Pts daughter left VM stating pt was recently admitted for internal bleeding and some medication changes were made. eliquis was stopped in the hospital and pts pcp decreased lasix to 40m daily. pts daughter noticed pt was swelling and increased lasix back to 468mbid. The past 2-3 days pt has taken lasix 4025mid due to swelling. Swelling has come down. Pts has an appt with our office on Monday but daughter wanted to see if Dr.Bensimhon had any advice.  Routed to Dr.[place]

## 2020-06-25 NOTE — Telephone Encounter (Signed)
Pts daughter [name] aware.

## 2020-06-25 NOTE — Telephone Encounter (Signed)
Sounds like they did the right thing.

## 2020-06-26 DIAGNOSIS — I480 Paroxysmal atrial fibrillation: Secondary | ICD-10-CM | POA: Diagnosis not present

## 2020-06-26 DIAGNOSIS — I50810 Right heart failure, unspecified: Secondary | ICD-10-CM | POA: Diagnosis not present

## 2020-06-26 DIAGNOSIS — I2721 Secondary pulmonary arterial hypertension: Secondary | ICD-10-CM | POA: Diagnosis not present

## 2020-06-26 DIAGNOSIS — J439 Emphysema, unspecified: Secondary | ICD-10-CM | POA: Diagnosis not present

## 2020-06-26 DIAGNOSIS — N1831 Chronic kidney disease, stage 3a: Secondary | ICD-10-CM | POA: Diagnosis not present

## 2020-06-26 DIAGNOSIS — J9611 Chronic respiratory failure with hypoxia: Secondary | ICD-10-CM | POA: Diagnosis not present

## 2020-06-26 DIAGNOSIS — M1990 Unspecified osteoarthritis, unspecified site: Secondary | ICD-10-CM | POA: Diagnosis not present

## 2020-06-26 DIAGNOSIS — I5032 Chronic diastolic (congestive) heart failure: Secondary | ICD-10-CM | POA: Diagnosis not present

## 2020-06-26 DIAGNOSIS — I130 Hypertensive heart and chronic kidney disease with heart failure and stage 1 through stage 4 chronic kidney disease, or unspecified chronic kidney disease: Secondary | ICD-10-CM | POA: Diagnosis not present

## 2020-06-26 DIAGNOSIS — D6489 Other specified anemias: Secondary | ICD-10-CM | POA: Diagnosis not present

## 2020-07-01 NOTE — Progress Notes (Signed)
mnnPatient ID: [name], female   DOB: May 08, 1946, 74 y.o.   MRN: [id number]  ADVANCED HF CLINIC NOTE  Patient ID: ANE CONERLY, female   DOB: October 16, 1945, 74 y.o.   MRN: [id number] Primary Cardiologist: Dr Haroldine Laws  Pulmonary: Dr Melvyn Novas PCP: Diona Browner   HPI:  Ms. Bungert is  74 y/o woman with a h/o HTN, COPD (former smoker quit 2015), seasonal allergies, asthma, pulmonary HTN, and diastolic dysfxn.  Admitted 1/17 with progressive DOE, hypoxia, and lower ext edema and found to have severe PAH. R/LHC 10/08/15 with normal coronaries, normal CO, and severe PAH PAP 104/49 with 13.6 WU. PAH well out of proportion to left-sided filling pressures and COPD. Started revatio 20 TID with consideration for macitentan as outpatient. PFTs 10/05/15  FEV1 0.91 (40%)FVC 1.64 (55%), DLCO 8.80 (38%). Auto-immune and infectious serologies negative. VQ negative. Discharge weight 148 pounds.   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 Has been drained twice. Underwent first thoracentesis on 11/7.  Transudative. CT scan 07/24/17 showed large R effusion and small pericardial effusion. +COPD.  Underwent repeat thoracentesis on 07/29/17 with 310cc out.  Cytology negative. F/u CXR 11/21 showed small bilateral effusions.  RF markedly ++ but other serology negative. Saw Dr. Amil Amen who said she did not have RA but was diagnosed with polyarthralgia/OA.   Admitted 02/19/20-03/10/20 with respiratory failure. Echo EF 55-60% with mild RV HK with large peridardial effusion. Suspicion for auto-immune flare. ESR 63. Auto-immune panel again negative. Treated with steroids with prompt improvement and regression of pericardial effusion. Had PAF Went to SNF.  Seen in clinic 7/21. Feeling much better.Able to do ADLs without problem. No edema, orthopnea or PND. Weight stable. Complaint with meds. No palpitations. No bleeding with apixaban.   Readmitted 9/28-10/3/21 with  Hgb  3.1. Underwent EGD and colonoscopy, concern for esophageal candidiasis, multiple polyps removed from the colon and colonic AVMs noted which is suspected to be the cause of ongoing bleeding in the setting of Eliquis. Eliquis stopped. Transfused a total of 4 units of PRBC this admission and given IV iron.  Here with family for f/u. Feeling better. No bleeding. Getting around the house without difficulty. No CP or SOB. Stools dark due to iron.     Studies:   Echo 4/21 LVEF 70-75% RV normal . No effusion. Personally reviewed  Echo 4/19 EF 65% RV normal. No effusion Personally reviewed Echo 5/18 EF 60-65% RV completely normal. No TR to measure RVSP. No effusion  Echo 3/17 EF 60-65% septum still flattened. RV size improved moderate HK. RVSP ~80. IVC small. Pericardial effusion essentially resolved   CXR 09/28/17 Small bilateral effusions  PAH regimen: 1. Adcirca 40 2. Letairis 10 (Switched to [name] as it was thought that sinusitis might be due to macitentan.)  6MW 8/17; 840 ft (256 M) on 2 L O2, sats ranged 83-95%, HR ranged 96-111. 6MW 9/19 780 ft =237.771mters On 3L of O2 Starting O2 94% Ending O2-85%  Starting heart rate-80 Ending heart rate- 110  RHC 12/18 RA = 5 RV = 56/9 PA = 52/17 (33) PCW = 10 Fick cardiac output/index = 7.5/4.5 PVR = 3.5 WU Ao sat = 96% PA sat = 73%, 72% High SVC sat = 76%  VQ scan low prob. CT chest mild COPD. Moderate pericardial effusion. 3v CAD. + cirrhosis. Hepatitis serologies negative.   PFTs  (FEV1 1.23 (52%) with FVC 2.01 (65%) and DLCO 51% in 7/15) PFT's 10/08/2015 FEV1 0.91 (  40 %) ratio 56%DLCO 38 % corrects to 63 % for alv volume  PFTs  12/06/15 FEV1 1.0 (44%) FVC 1.74 (58%0 DLCO 28%  R/LHC 10/08/15 with normal coronaries, normal CO, and sever PAH with 13.6 WU.  Findings: Ao = 108/68 (86) LV = 102/10/11 RA = 9 RV = 97/8/12 PA = 104/49 (72) PCW = 18 Fick cardiac output/index = 3.96/2.34 PVR = 13.6 WU FA sat = 93% PA sat =  66%, 69%  RHC 5/18 RA = 2 RV = 48/4 PA = 45/17 (30) PCW = 8 Fick cardiac output/index = 4.9/3.0 PVR = 4.4 WU Ao sat = 94% PA sat = 68%, 70%  Review of systems complete and found to be negative unless listed in HPI.   SH:  Social History   Socioeconomic History   Marital status: Married    Spouse name: Not on file   Number of children: Not on file   Years of education: 2   Highest education level: Not on file  Occupational History   Occupation: retired Pharmacist, hospital  Tobacco Use   Smoking status: Former Smoker    Packs/day: 1.00    Years: 40.00    Pack years: 40.00    Types: Cigarettes    Quit date: 02/22/2014    Years since quitting: 6.3   Smokeless tobacco: Never Used  Vaping Use   Vaping Use: Never used  Substance and Sexual Activity   Alcohol use: No   Drug use: No   Sexual activity: Never  Other Topics Concern   Not on file  Social History Narrative   Married with 2 children.  Independent of ADLs.      Does not have a living will.   Would desire CPR but would not want prolonged life support if futile- husband aware.   Social Determinants of Health   Financial Resource Strain:    Difficulty of Paying Living Expenses: Not on file  Food Insecurity:    Worried About Charity fundraiser in the Last Year: Not on file   [employer] of Food in the Last Year: Not on file  Transportation Needs:    Lack of Transportation (Medical): Not on file   Lack of Transportation (Non-Medical): Not on file  Physical Activity:    Days of Exercise per Week: Not on file   Minutes of Exercise per Session: Not on file  Stress:    Feeling of Stress : Not on file  Social Connections:    Frequency of Communication with Friends and Family: Not on file   Frequency of Social Gatherings with Friends and Family: Not on file   Attends Religious Services: Not on file   Active Member of Clubs or Organizations: Not on file   Attends Archivist Meetings: Not on  file   Marital Status: Not on file  Intimate Partner Violence:    Fear of Current or Ex-Partner: Not on file   Emotionally Abused: Not on file   Physically Abused: Not on file   Sexually Abused: Not on file    FH:  Family History  Problem Relation Age of Onset   Glaucoma Brother    Vascular Disease Father        Died of complications from surgery   Heart attack Father    Asthma Mother        Died of asthma attack   Dementia Mother    Hyperlipidemia Mother    Hypertension Mother     Past Medical History:  Diagnosis Date   Acute respiratory failure with hypoxia and hypercapnea 02/26/2014   Allergic rhinitis due to pollen    Allergy    Arthritis    Asthma    CHF (congestive heart failure) (HCC)    COPD (chronic obstructive pulmonary disease) (HCC)    Diastolic dysfunction    a. 02/2014 Echo: EF 65-70%, Gr 1 DD, RVH;  b. 09/2015 Echo: EF 55%, Gr 1 DD.   Emphysema of lung (HCC)    H/O seasonal allergies    History of chicken pox    History of tobacco abuse    a. Quit 2015.   History of UTI    Hypertension    Hypertensive heart disease    Lower extremity edema    Pericardial effusion    a. 09/2015 Echo: Mod eff w/o tamponade.   Right heart failure with reduced right ventricular function ([place])    a. 09/2015 Echo: EF 55%, Gr 1 DD, D shaped IV septum, sev dil RV with mod reduced fxn, mod dil RA, RV-RA grad 32mHg, PASP 872mg, mod pericard eff w/o tamponade.   Severe Pulmonary Hypertension    a. 09/2015 Echo: PASP 8761m.    Current Outpatient Medications  Medication Sig Dispense Refill   acetaminophen (TYLENOL) 325 MG tablet Take 325 mg by mouth at bedtime as needed for moderate pain or headache.      albuterol (VENTOLIN HFA) 108 (90 Base) MCG/ACT inhaler Inhale 2 puffs into the lungs every 6 (six) hours as needed for wheezing or shortness of breath. 2 each 0   ambrisentan (LETAIRIS) 10 MG tablet TAKE 1 TABLET BY MOUTH DAILY. DO NOT HANDLE IF  PREGNANT. DO NOT SPLIT, CRUSH, OR CHEW. AVOID INHALATION AND CONTACT WITH SKIN OR EYES . CALL ([phone] REFILL. 90 tablet 3   amiodarone (PACERONE) 100 MG tablet Take 1 tablet (100 mg total) by mouth daily. 30 tablet 6   aspirin EC 81 MG EC tablet Take 1 tablet (81 mg total) by mouth daily. 30 tablet 0   colchicine 0.6 MG tablet TAKE 1/2 A TABLET BY MOUTH TWICE DAILY 30 tablet 1   ferrous sulfate 325 (65 FE) MG EC tablet Take 1 tablet (325 mg total) by mouth 2 (two) times daily with a meal. 60 tablet 3   fluconazole (DIFLUCAN) 100 MG tablet Take 1 tablet (100 mg total) by mouth daily. 7 tablet 0   furosemide (LASIX) 40 MG tablet Take 1 tablet (40 mg total) by mouth daily. 30 tablet 1   OXYGEN 2 to 2lpm 24/7     pantoprazole (PROTONIX) 40 MG tablet TAKE 1 TABLET BY MOUTH EVERY DAY 90 tablet 3   potassium chloride SA (KLOR-CON) 20 MEQ tablet Take 1 tablet (20 mEq total) by mouth daily. 90 tablet 1   spironolactone (ALDACTONE) 25 MG tablet Take 12.5 mg by mouth daily.     tadalafil, PAH, (ADCIRCA) 20 MG tablet TAKE 2 TABLETS DAILY Generic for Adcirca 180 tablet 3   No current facility-administered medications for this encounter.    Vitals:   07/02/20 1348  BP: 108/60  Pulse: 89  SpO2: 92%  Weight: 59.9 kg (132 lb)   Wt Readings from Last 3 Encounters:  07/02/20 59.9 kg (132 lb)  06/19/20 58.1 kg (128 lb)  06/05/20 56.2 kg (124 lb)     PHYSICAL EXAM: General:  Well appearing. Wearing O2. No resp difficulty HEENT: normal + telengectasias Neck: supple. JVP 5 Carotids 2+ bilat; no bruits. No lymphadenopathy or thryomegaly  appreciated. Cor: PMI nondisplaced. Regular rate & rhythm. 2/6 TR Lungs: clear decreased throughout Abdomen: soft, nontender, nondistended. No hepatosplenomegaly. No bruits or masses. Good bowel sounds. Extremities: no cyanosis, clubbing, rash, 1-2+ ankle edema Neuro: alert & orientedx3, cranial nerves grossly intact. moves all 4 extremities w/o  difficulty. Affect pleasant   ASSESSMENT & PLAN:  1. Large pericardial effusion - Seen on echo in 6/21. Given PAH, telengectacias and recurrent effusions my suspicion for underlying CTD remains high but serology again negative - Resolved with steroids. Has been off steroids for several weeks. Will need repeat echo to re-assess - If recurs off steroids will restart and refer back to Rheum   2. PAH with right heart failure - suspect combination of WHO Group I and III PAH - Echo 5/18 with complete resolution of RV strain. No significant TR to estimate RVSP - Echo 4/19 with EF 65% no evidence of RV strain  - echo  12/09/19 EF 70-75% RV normal Personally reviewed - Repeat RHC 12/18 with mild PAH (mean PA 33) - Resolution of PAH on recent echos - Continue Letairis and tadalafil. Have previously discussed adding selexipeg but pressures down on last cath/echo - Volume up mildly today. Continue lasix 40 bid for 2 more days then back to 40 daily. Wear compression socks - Labs today. Repeat echo as above  3. Recurrent R pleural effusion - Given improvement in PA pressures doubt effusion related to severe PAH/ Previous ab CT with evidence of cirrhosis.   -  Fluid analysis = transudative.  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 Now controlled with with diuresis - No significant recurrence  4. Chronic Respiratory failure - Multifactorial. Continue supplemental O2.  - Follows with Dr. Melvyn Novas.   5. COPD- Gold III  - on inhalers. Follows with Pulmonary. As above. No change  6. Cirrhosis on CT - due to RHF. Hepatitis serologies are negative. Possibly due to right heart failure  7. PAF - in NSR today. Had first occurrence in setting of pericardial effusion in 6/21 - continue amio for now. Will reduce dose to 100 daily. Will stop in near future - Eliquis stopped due to severe LGIB 9/21 (hgb 3.1). Will not restart with AVMs  8. Recent GIB - due to AVMs - off Eliquis. Will not  restart    [name], MD  10:10 PM

## 2020-07-02 ENCOUNTER — Encounter (HOSPITAL_COMMUNITY): Payer: Self-pay | Admitting: Internal Medicine

## 2020-07-02 ENCOUNTER — Other Ambulatory Visit: Payer: Self-pay

## 2020-07-02 ENCOUNTER — Ambulatory Visit (HOSPITAL_COMMUNITY)
Admission: RE | Admit: 2020-07-02 | Discharge: 2020-07-02 | Disposition: A | Discharge status: Home / Self Care | Payer: Medicare HMO | Source: Ambulatory Visit | Attending: Internal Medicine | Admitting: Internal Medicine

## 2020-07-02 VITALS — BP 108/60 | HR 89 | Wt 132.0 lb

## 2020-07-02 DIAGNOSIS — I313 Pericardial effusion (noninflammatory): Secondary | ICD-10-CM | POA: Diagnosis not present

## 2020-07-02 DIAGNOSIS — J449 Chronic obstructive pulmonary disease, unspecified: Secondary | ICD-10-CM | POA: Insufficient documentation

## 2020-07-02 DIAGNOSIS — I2729 Other secondary pulmonary hypertension: Secondary | ICD-10-CM | POA: Diagnosis not present

## 2020-07-02 DIAGNOSIS — Z87891 Personal history of nicotine dependence: Secondary | ICD-10-CM | POA: Diagnosis not present

## 2020-07-02 DIAGNOSIS — K7460 Unspecified cirrhosis of liver: Secondary | ICD-10-CM | POA: Diagnosis not present

## 2020-07-02 DIAGNOSIS — Z8249 Family history of ischemic heart disease and other diseases of the circulatory system: Secondary | ICD-10-CM | POA: Diagnosis not present

## 2020-07-02 DIAGNOSIS — I110 Hypertensive heart disease with heart failure: Secondary | ICD-10-CM | POA: Insufficient documentation

## 2020-07-02 DIAGNOSIS — I5032 Chronic diastolic (congestive) heart failure: Secondary | ICD-10-CM | POA: Diagnosis not present

## 2020-07-02 DIAGNOSIS — J90 Pleural effusion, not elsewhere classified: Secondary | ICD-10-CM | POA: Insufficient documentation

## 2020-07-02 DIAGNOSIS — I272 Pulmonary hypertension, unspecified: Secondary | ICD-10-CM

## 2020-07-02 DIAGNOSIS — I2721 Secondary pulmonary arterial hypertension: Secondary | ICD-10-CM

## 2020-07-02 DIAGNOSIS — I3139 Other pericardial effusion (noninflammatory): Secondary | ICD-10-CM

## 2020-07-02 DIAGNOSIS — I480 Paroxysmal atrial fibrillation: Secondary | ICD-10-CM | POA: Insufficient documentation

## 2020-07-02 DIAGNOSIS — Z79899 Other long term (current) drug therapy: Secondary | ICD-10-CM | POA: Insufficient documentation

## 2020-07-02 DIAGNOSIS — Z7982 Long term (current) use of aspirin: Secondary | ICD-10-CM | POA: Insufficient documentation

## 2020-07-02 LAB — CBC
HCT: 33.3 % — ABNORMAL LOW (ref 36.0–46.0)
Hemoglobin: 9.6 g/dL — ABNORMAL LOW (ref 12.0–15.0)
MCH: 27.8 pg (ref 26.0–34.0)
MCHC: 28.8 g/dL — ABNORMAL LOW (ref 30.0–36.0)
MCV: 96.5 fL (ref 80.0–100.0)
Platelets: 281 10*3/uL (ref 150–400)
RBC: 3.45 MIL/uL — ABNORMAL LOW (ref 3.87–5.11)
RDW: 25.1 % — ABNORMAL HIGH (ref 11.5–15.5)
WBC: 5.7 10*3/uL (ref 4.0–10.5)
nRBC: 0 % (ref 0.0–0.2)

## 2020-07-02 LAB — BASIC METABOLIC PANEL
Anion gap: 11 (ref 5–15)
BUN: 16 mg/dL (ref 8–23)
CO2: 33 mmol/L — ABNORMAL HIGH (ref 22–32)
Calcium: 8.6 mg/dL — ABNORMAL LOW (ref 8.9–10.3)
Chloride: 97 mmol/L — ABNORMAL LOW (ref 98–111)
Creatinine, Ser: 1.21 mg/dL — ABNORMAL HIGH (ref 0.44–1.00)
GFR, Estimated: 48 mL/min — ABNORMAL LOW (ref >60)
Glucose, Bld: 108 mg/dL — ABNORMAL HIGH (ref 70–99)
Potassium: 3.9 mmol/L (ref 3.5–5.1)
Sodium: 141 mmol/L (ref 135–145)

## 2020-07-02 LAB — BRAIN NATRIURETIC PEPTIDE: B Natriuretic Peptide: 344.5 pg/mL — ABNORMAL HIGH (ref 0.0–100.0)

## 2020-07-02 NOTE — Patient Instructions (Signed)
Increase Furosemide for 2 days  Labs done today, your results will be available in MyChart, we will contact you for abnormal readings.  Please wear your compression hose daily, place them on as soon as you get up in the morning and remove before you go to bed at night.  Your physician has requested that you have an echocardiogram. Echocardiography is a painless test that uses sound waves to create images of your heart. It provides your doctor with information about the size and shape of your heart and how well your heart's chambers and valves are working. This procedure takes approximately one hour. There are no restrictions for this procedure.  Your physician recommends that you schedule a follow-up appointment in: 4 months  If you have any questions or concerns before your next appointment please send [place] a message through [place] or call our office at [phone].    TO LEAVE A MESSAGE FOR THE NURSE SELECT OPTION 2, PLEASE LEAVE A MESSAGE INCLUDING: . YOUR NAME . DATE OF BIRTH . CALL BACK NUMBER . REASON FOR CALL**this is important as we prioritize the call backs  [place] AS LONG AS YOU CALL BEFORE 4:00 PM  At the [facility], you and your health needs are our priority. As part of our continuing mission to provide you with exceptional heart care, we have created designated Provider Care Teams. These Care Teams include your primary Cardiologist (physician) and Advanced Practice Providers (APPs- Physician Assistants and Nurse Practitioners) who all work together to provide you with the care you need, when you need it.   You may see any of the following providers on your designated Care Team at your next follow up: [name] Dr [name] . Dr Loralie Champagne . Darrick Grinder, NP . Lyda Jester, PA . Audry Riles, PharmD   Please be sure to bring in all your medications bottles to every appointment.

## 2020-07-03 DIAGNOSIS — I50810 Right heart failure, unspecified: Secondary | ICD-10-CM | POA: Diagnosis not present

## 2020-07-03 DIAGNOSIS — M1990 Unspecified osteoarthritis, unspecified site: Secondary | ICD-10-CM | POA: Diagnosis not present

## 2020-07-03 DIAGNOSIS — I2721 Secondary pulmonary arterial hypertension: Secondary | ICD-10-CM | POA: Diagnosis not present

## 2020-07-03 DIAGNOSIS — I5032 Chronic diastolic (congestive) heart failure: Secondary | ICD-10-CM | POA: Diagnosis not present

## 2020-07-03 DIAGNOSIS — I130 Hypertensive heart and chronic kidney disease with heart failure and stage 1 through stage 4 chronic kidney disease, or unspecified chronic kidney disease: Secondary | ICD-10-CM | POA: Diagnosis not present

## 2020-07-03 DIAGNOSIS — D6489 Other specified anemias: Secondary | ICD-10-CM | POA: Diagnosis not present

## 2020-07-03 DIAGNOSIS — J9611 Chronic respiratory failure with hypoxia: Secondary | ICD-10-CM | POA: Diagnosis not present

## 2020-07-03 DIAGNOSIS — J439 Emphysema, unspecified: Secondary | ICD-10-CM | POA: Diagnosis not present

## 2020-07-03 DIAGNOSIS — N1831 Chronic kidney disease, stage 3a: Secondary | ICD-10-CM | POA: Diagnosis not present

## 2020-07-03 DIAGNOSIS — I480 Paroxysmal atrial fibrillation: Secondary | ICD-10-CM | POA: Diagnosis not present

## 2020-07-08 DIAGNOSIS — R062 Wheezing: Secondary | ICD-10-CM | POA: Diagnosis not present

## 2020-07-08 DIAGNOSIS — J9601 Acute respiratory failure with hypoxia: Secondary | ICD-10-CM | POA: Diagnosis not present

## 2020-07-08 DIAGNOSIS — J449 Chronic obstructive pulmonary disease, unspecified: Secondary | ICD-10-CM | POA: Diagnosis not present

## 2020-07-08 DIAGNOSIS — J45909 Unspecified asthma, uncomplicated: Secondary | ICD-10-CM | POA: Diagnosis not present

## 2020-07-12 ENCOUNTER — Other Ambulatory Visit: Payer: Self-pay | Admitting: Family Medicine

## 2020-07-13 ENCOUNTER — Ambulatory Visit (HOSPITAL_COMMUNITY)
Admission: RE | Admit: 2020-07-13 | Discharge: 2020-07-13 | Disposition: A | Discharge status: Home / Self Care | Payer: Medicare HMO | Source: Ambulatory Visit | Attending: Internal Medicine | Admitting: Internal Medicine

## 2020-07-13 ENCOUNTER — Other Ambulatory Visit: Payer: Self-pay

## 2020-07-13 DIAGNOSIS — I2721 Secondary pulmonary arterial hypertension: Secondary | ICD-10-CM

## 2020-07-13 DIAGNOSIS — I5032 Chronic diastolic (congestive) heart failure: Secondary | ICD-10-CM

## 2020-07-13 DIAGNOSIS — J449 Chronic obstructive pulmonary disease, unspecified: Secondary | ICD-10-CM | POA: Diagnosis not present

## 2020-07-13 DIAGNOSIS — I319 Disease of pericardium, unspecified: Secondary | ICD-10-CM | POA: Diagnosis not present

## 2020-07-13 DIAGNOSIS — I110 Hypertensive heart disease with heart failure: Secondary | ICD-10-CM | POA: Insufficient documentation

## 2020-07-13 DIAGNOSIS — I509 Heart failure, unspecified: Secondary | ICD-10-CM | POA: Diagnosis not present

## 2020-07-13 DIAGNOSIS — I272 Pulmonary hypertension, unspecified: Secondary | ICD-10-CM | POA: Insufficient documentation

## 2020-07-13 LAB — ECHOCARDIOGRAM COMPLETE
AR max vel: 1.59 cm2
AV Peak grad: 11.7 mmHg
Ao pk vel: 1.71 m/s
Area-P 1/2: 5.27 cm2
S' Lateral: 2.2 cm

## 2020-07-13 NOTE — Progress Notes (Signed)
  Echocardiogram 2D Echocardiogram has been performed.  Linda Stevens 07/13/2020, 2:42 PM

## 2020-07-18 NOTE — Assessment & Plan Note (Signed)
No further bleeding noted since discharge.  Off Eliquis... does not wish to ever restart. Due fore re-eval with cbc.

## 2020-07-18 NOTE — Assessment & Plan Note (Addendum)
Due for re-eval. On iron.

## 2020-07-18 NOTE — Assessment & Plan Note (Signed)
Low normal on current regimen.

## 2020-07-18 NOTE — Assessment & Plan Note (Signed)
Discuss restart vs hold Eliquis given life threatening bleeding at recent hospitalization. Remain off for now.

## 2020-07-18 NOTE — Assessment & Plan Note (Signed)
Elevate feet and wear compression hose for peripheral swelling.  If continued you can take an extra 1/2 tab of lasix, but only if BP not as low as today.

## 2020-07-28 ENCOUNTER — Other Ambulatory Visit: Payer: Self-pay | Admitting: Family Medicine

## 2020-07-30 ENCOUNTER — Other Ambulatory Visit: Payer: Self-pay | Admitting: Family Medicine

## 2020-07-30 NOTE — Telephone Encounter (Signed)
Last office visit 06/19/2020 for hospital follow up.Last refilled 05/29/2020 for #30 with 1 refills. Previous Hospital discharge states to continue colchicine for 3 months. Refill?

## 2020-08-07 DIAGNOSIS — J45909 Unspecified asthma, uncomplicated: Secondary | ICD-10-CM | POA: Diagnosis not present

## 2020-08-07 DIAGNOSIS — J449 Chronic obstructive pulmonary disease, unspecified: Secondary | ICD-10-CM | POA: Diagnosis not present

## 2020-08-07 DIAGNOSIS — R062 Wheezing: Secondary | ICD-10-CM | POA: Diagnosis not present

## 2020-08-07 DIAGNOSIS — J9601 Acute respiratory failure with hypoxia: Secondary | ICD-10-CM | POA: Diagnosis not present

## 2020-08-21 ENCOUNTER — Telehealth: Payer: Self-pay

## 2020-08-21 NOTE — Telephone Encounter (Signed)
Patient's husband notified as instructed by telephone and verbalized understanding. Patient's husband stated that he will just call back in about a week and schedule an appointment to bring his wife in for lab work.

## 2020-08-21 NOTE — Telephone Encounter (Signed)
LMTCB

## 2020-08-21 NOTE — Telephone Encounter (Signed)
Mr Lutes (DPR signed) said that in Sundays newspaper Mr Putnam read article about a lady that was taking iron medication and her iron level got too high and she had SOB and difficulty getting her breath. Mr Busenbark said he has noticed recently that when pt is up walking in the house when she sits down she is SOB for short period of time. The SOB is upon exertion only; if pt is sitting no SOB. Mr Massengale said pt takes ferrous sulfate 325 mg taking one tab bid. No CP. Pt last seen 06/28/20 and had iron level at that time. Mr Rotenberry said he did not know what the SOB was coming from because he knows pt has breathing problems, CHF and taking iron med.Mr Behrens wanted Dr Rometta Emery opinion and request cb after Dr Diona Browner has reviewed note. (pt last saw Dr Haroldine Laws 07/02/20; and Dr Melvyn Novas 02/13/20.

## 2020-08-21 NOTE — Telephone Encounter (Signed)
She was still anemia at last OV and iron was low normal, some iron tests still low  If worsening SOB have her make appt or she can come in for lab only visit and we can recheck iron levels and Hg to see if she needs to continue iron.

## 2020-08-27 ENCOUNTER — Other Ambulatory Visit: Payer: Self-pay | Admitting: Family Medicine

## 2020-08-27 NOTE — Telephone Encounter (Signed)
Last office visit 06/19/2020 for hospital follow up.Last refilled 07/30/20 for #30 with 0 refills. Previous Hospital discharge states to continue colchicine for 3 months. No future appointments with PCP. Refill?

## 2020-09-07 DIAGNOSIS — J9601 Acute respiratory failure with hypoxia: Secondary | ICD-10-CM | POA: Diagnosis not present

## 2020-09-07 DIAGNOSIS — J449 Chronic obstructive pulmonary disease, unspecified: Secondary | ICD-10-CM | POA: Diagnosis not present

## 2020-09-07 DIAGNOSIS — J45909 Unspecified asthma, uncomplicated: Secondary | ICD-10-CM | POA: Diagnosis not present

## 2020-09-07 DIAGNOSIS — R062 Wheezing: Secondary | ICD-10-CM | POA: Diagnosis not present

## 2020-09-12 ENCOUNTER — Telehealth (HOSPITAL_COMMUNITY): Payer: Self-pay | Admitting: Pharmacist

## 2020-09-12 NOTE — Telephone Encounter (Signed)
Patient Advocate Encounter   Received notification from [facility] that prior authorization for tadalafil is required.   PA submitted on CoverMyMeds Key [id number] Status is pending   Will continue to follow.  Audry Riles, PharmD, BCPS, BCCP, CPP Heart Failure Clinic Pharmacist [phone]

## 2020-09-12 NOTE — Telephone Encounter (Signed)
Advanced Heart Failure Patient Advocate Encounter  Prior Authorization for tadalafil has been approved.    Effective dates: 09/08/2020 - 09/07/2021  Audry Riles, PharmD, BCPS, BCCP, CPP Heart Failure Clinic Pharmacist [phone]

## 2020-09-25 ENCOUNTER — Other Ambulatory Visit: Payer: Self-pay | Admitting: Family Medicine

## 2020-09-25 NOTE — Telephone Encounter (Signed)
Pharmacy requests refill on: Colchicine 0.6 mg  LAST REFILL: 08/28/2020 (Q-30, R-0) LAST OV: 06/19/2020 NEXT OV: Not Scheduled  PHARMACY: [id number] [place], [place]

## 2020-09-26 ENCOUNTER — Telehealth (HOSPITAL_COMMUNITY): Payer: Self-pay | Admitting: Pharmacy Technician

## 2020-09-26 ENCOUNTER — Other Ambulatory Visit: Payer: Self-pay | Admitting: Family Medicine

## 2020-09-26 ENCOUNTER — Other Ambulatory Visit (HOSPITAL_COMMUNITY): Payer: Self-pay | Admitting: Internal Medicine

## 2020-09-26 ENCOUNTER — Telehealth (HOSPITAL_COMMUNITY): Payer: Self-pay | Admitting: Pharmacist

## 2020-09-26 NOTE — Telephone Encounter (Signed)
Sent in [facility] patient enrollment form to [facility]. Patient has been taking generic ambrisentan. Unfortunately, no grants are open for Pulmonary Hypertension medications at this time. Therefore, will change to [facility] as they have a patient assistance program, while ambrisentan does not.   Audry Riles, PharmD, BCPS, BCCP, CPP Heart Failure Clinic Pharmacist [phone]

## 2020-09-26 NOTE — Telephone Encounter (Signed)
Spoke to the patient's husband regarding Tadalafil and Ambrisentan co-pays. The patient previously had a grant with [employer] that has since expired. We will have to switch her to [facility] in order to get assistance.. Placed the patient on the PAN [facility] wait list. Wait List ID: [id number].  Started an [place] for [employer] assistance. Suggested that the patient use [employer] for cheaper pricing on Tadalafil RX. The application will be at the check in desk for the patient to sign.

## 2020-10-01 ENCOUNTER — Telehealth (HOSPITAL_COMMUNITY): Payer: Self-pay | Admitting: Pharmacist

## 2020-10-01 NOTE — Telephone Encounter (Signed)
Received message from [employer] that patient is not eligible for patient assistance with [employer] because she has insurance. The [employer] patient assistance program is only for patients with no insurance. Unfortunately, no samples are available with ambrisentan or Letairis. Will have to wait for grant to open, placed patient on waitlist. Patient and Patient's husband aware and expressed understanding.   Audry Riles, PharmD, BCPS, BCCP, CPP Heart Failure Clinic Pharmacist [phone]

## 2020-10-02 ENCOUNTER — Other Ambulatory Visit (HOSPITAL_COMMUNITY): Payer: Self-pay | Admitting: *Deleted

## 2020-10-02 MED ORDER — TADALAFIL (PAH) 20 MG PO TABS: ORAL_TABLET | ORAL | 3 refills | Status: DC

## 2020-10-02 NOTE — Telephone Encounter (Signed)
Was able to get the patient a [employer] for her PH medications.  Total grant award is $10,000.  Effective Dates: 10/02/20-09/01/21  Billing information    BIN [id number] PCN [id number] [id number] ID [id number]  Called and gave patient's pharmacy billing information. The Ambrisentan will be shipped out tomorrow. Called and updated the patient's husband. Asked CMA to send Tadalafil to [facility] on [facility], as requested.   Charlann Boxer, CPhT

## 2020-10-04 ENCOUNTER — Other Ambulatory Visit (HOSPITAL_COMMUNITY): Payer: Self-pay

## 2020-10-04 MED ORDER — TADALAFIL 20 MG PO TABS: 40.0000 mg | ORAL_TABLET | Freq: Every day | ORAL | 3 refills | Status: DC

## 2020-10-08 DIAGNOSIS — J45909 Unspecified asthma, uncomplicated: Secondary | ICD-10-CM | POA: Diagnosis not present

## 2020-10-08 DIAGNOSIS — J449 Chronic obstructive pulmonary disease, unspecified: Secondary | ICD-10-CM | POA: Diagnosis not present

## 2020-10-08 DIAGNOSIS — J9601 Acute respiratory failure with hypoxia: Secondary | ICD-10-CM | POA: Diagnosis not present

## 2020-10-08 DIAGNOSIS — R062 Wheezing: Secondary | ICD-10-CM | POA: Diagnosis not present

## 2020-10-11 ENCOUNTER — Telehealth (HOSPITAL_COMMUNITY): Payer: Self-pay | Admitting: Pharmacy Technician

## 2020-10-11 NOTE — Telephone Encounter (Signed)
Spoke with patient's husband, they were successful in obtaining a TAF grant for the patient's PH medications. The grant  Is conditionally approved for one month. The grant will be approved for the remainder of 2022, after the patient returns the documents requested by [employer].  Charlann Boxer, CPhT

## 2020-10-15 ENCOUNTER — Telehealth (HOSPITAL_COMMUNITY): Payer: Self-pay | Admitting: Pharmacy Technician

## 2020-10-15 NOTE — Telephone Encounter (Signed)
Spoke with patient's husband, he was able to provide billing information for TAF.  BIN [id number] PCN AS ID [id number] Group [id number]   The patient is awaiting documentation from [employer], her husband may bring that to fax in to [employer] once it is completed. I advised him that I would be more than happy to fax in any documentation he needed to [employer].  Charlann Boxer, CPhT

## 2020-10-19 ENCOUNTER — Telehealth (HOSPITAL_COMMUNITY): Payer: Self-pay | Admitting: Pharmacy Technician

## 2020-10-19 NOTE — Telephone Encounter (Signed)
Patient's husband came to clinic to fax in the documentation he filled out for [employer]AF. I made a copy and sent it in via fax.

## 2020-10-24 ENCOUNTER — Other Ambulatory Visit: Payer: Self-pay | Admitting: Family Medicine

## 2020-10-24 ENCOUNTER — Other Ambulatory Visit (HOSPITAL_COMMUNITY): Payer: Self-pay | Admitting: Internal Medicine

## 2020-10-24 NOTE — Telephone Encounter (Signed)
Last office visit10/08/2020 for hospital follow up.Last refilled01/18/2021 for #30 with 0refills.PreviousHospital discharge states to continue colchicine for 3 months. No future appointments with PCP. Refill?

## 2020-10-25 ENCOUNTER — Telehealth: Payer: Self-pay

## 2020-10-25 NOTE — Telephone Encounter (Signed)
Pt's husband, Delfino Lovett (on dpr), calling stating they got a [facility] that [facility]icine was denied.  He says pt is running low om med.  Plz advise [name] at [phone].

## 2020-10-25 NOTE — Telephone Encounter (Signed)
Spoke with Mr. Linda Stevens and advised him  that Mrs Linda Stevens only needed to take the colchicine for 3 months after she was discharged from the hospital so she has completed that course.  Colchicine can be stopped.  Mr. Leet states understanding.

## 2020-10-25 NOTE — Addendum Note (Signed)
Addended by: Carter Kitten on: 10/25/2020 03:48 PM   Modules accepted: Orders

## 2020-11-02 ENCOUNTER — Encounter (HOSPITAL_COMMUNITY): Payer: Self-pay | Admitting: Internal Medicine

## 2020-11-02 ENCOUNTER — Other Ambulatory Visit: Payer: Self-pay

## 2020-11-02 ENCOUNTER — Ambulatory Visit (HOSPITAL_COMMUNITY)
Admission: RE | Admit: 2020-11-02 | Discharge: 2020-11-02 | Disposition: A | Discharge status: Home / Self Care | Payer: Medicare HMO | Source: Ambulatory Visit | Attending: Internal Medicine | Admitting: Internal Medicine

## 2020-11-02 VITALS — BP 124/58 | HR 81 | Wt 132.6 lb

## 2020-11-02 DIAGNOSIS — Z7982 Long term (current) use of aspirin: Secondary | ICD-10-CM | POA: Diagnosis not present

## 2020-11-02 DIAGNOSIS — Z79899 Other long term (current) drug therapy: Secondary | ICD-10-CM | POA: Diagnosis not present

## 2020-11-02 DIAGNOSIS — Z87891 Personal history of nicotine dependence: Secondary | ICD-10-CM | POA: Insufficient documentation

## 2020-11-02 DIAGNOSIS — J9610 Chronic respiratory failure, unspecified whether with hypoxia or hypercapnia: Secondary | ICD-10-CM | POA: Insufficient documentation

## 2020-11-02 DIAGNOSIS — I2729 Other secondary pulmonary hypertension: Secondary | ICD-10-CM | POA: Diagnosis not present

## 2020-11-02 DIAGNOSIS — I50810 Right heart failure, unspecified: Secondary | ICD-10-CM | POA: Insufficient documentation

## 2020-11-02 DIAGNOSIS — I313 Pericardial effusion (noninflammatory): Secondary | ICD-10-CM | POA: Insufficient documentation

## 2020-11-02 DIAGNOSIS — Z8249 Family history of ischemic heart disease and other diseases of the circulatory system: Secondary | ICD-10-CM | POA: Diagnosis not present

## 2020-11-02 DIAGNOSIS — Z9981 Dependence on supplemental oxygen: Secondary | ICD-10-CM | POA: Insufficient documentation

## 2020-11-02 DIAGNOSIS — I4891 Unspecified atrial fibrillation: Secondary | ICD-10-CM

## 2020-11-02 DIAGNOSIS — K7460 Unspecified cirrhosis of liver: Secondary | ICD-10-CM | POA: Diagnosis not present

## 2020-11-02 DIAGNOSIS — I5032 Chronic diastolic (congestive) heart failure: Secondary | ICD-10-CM | POA: Insufficient documentation

## 2020-11-02 DIAGNOSIS — J90 Pleural effusion, not elsewhere classified: Secondary | ICD-10-CM | POA: Insufficient documentation

## 2020-11-02 DIAGNOSIS — R0981 Nasal congestion: Secondary | ICD-10-CM | POA: Diagnosis not present

## 2020-11-02 DIAGNOSIS — M159 Polyosteoarthritis, unspecified: Secondary | ICD-10-CM | POA: Insufficient documentation

## 2020-11-02 DIAGNOSIS — J449 Chronic obstructive pulmonary disease, unspecified: Secondary | ICD-10-CM | POA: Insufficient documentation

## 2020-11-02 DIAGNOSIS — K922 Gastrointestinal hemorrhage, unspecified: Secondary | ICD-10-CM | POA: Insufficient documentation

## 2020-11-02 DIAGNOSIS — I480 Paroxysmal atrial fibrillation: Secondary | ICD-10-CM | POA: Insufficient documentation

## 2020-11-02 DIAGNOSIS — I110 Hypertensive heart disease with heart failure: Secondary | ICD-10-CM | POA: Diagnosis not present

## 2020-11-02 LAB — BASIC METABOLIC PANEL
Anion gap: 10 (ref 5–15)
BUN: 21 mg/dL (ref 8–23)
CO2: 33 mmol/L — ABNORMAL HIGH (ref 22–32)
Calcium: 8.8 mg/dL — ABNORMAL LOW (ref 8.9–10.3)
Chloride: 95 mmol/L — ABNORMAL LOW (ref 98–111)
Creatinine, Ser: 1.31 mg/dL — ABNORMAL HIGH (ref 0.44–1.00)
GFR, Estimated: 43 mL/min — ABNORMAL LOW (ref >60)
Glucose, Bld: 106 mg/dL — ABNORMAL HIGH (ref 70–99)
Potassium: 3.9 mmol/L (ref 3.5–5.1)
Sodium: 138 mmol/L (ref 135–145)

## 2020-11-02 LAB — CBC
HCT: 34.4 % — ABNORMAL LOW (ref 36.0–46.0)
Hemoglobin: 10.6 g/dL — ABNORMAL LOW (ref 12.0–15.0)
MCH: 32.1 pg (ref 26.0–34.0)
MCHC: 30.8 g/dL (ref 30.0–36.0)
MCV: 104.2 fL — ABNORMAL HIGH (ref 80.0–100.0)
Platelets: 171 10*3/uL (ref 150–400)
RBC: 3.3 MIL/uL — ABNORMAL LOW (ref 3.87–5.11)
RDW: 16.8 % — ABNORMAL HIGH (ref 11.5–15.5)
WBC: 5.4 10*3/uL (ref 4.0–10.5)
nRBC: 0 % (ref 0.0–0.2)

## 2020-11-02 LAB — TSH: TSH: 9.665 u[IU]/mL — ABNORMAL HIGH (ref 0.350–4.500)

## 2020-11-02 LAB — T4, FREE: Free T4: 1.28 ng/dL — ABNORMAL HIGH (ref 0.61–1.12)

## 2020-11-02 LAB — BRAIN NATRIURETIC PEPTIDE: B Natriuretic Peptide: 448.2 pg/mL — ABNORMAL HIGH (ref 0.0–100.0)

## 2020-11-02 MED ORDER — PREDNISONE 10 MG PO TABS: ORAL_TABLET | ORAL | 0 refills | Status: AC

## 2020-11-02 NOTE — Patient Instructions (Signed)
Start Prednisone Taper, Take:   40 mg (4 tabs) Daily for 2 days, then  30 mg (3 tabs) Daily for 2 days, then  20 mg (2 tabs) Daily for 2 days, then  10 mg (1 tab) Daily for 2 days   Labs done today, your results will be available in MyChart, we will contact you for abnormal readings.  Please call our office in August 2022 to schedule your follow up appointment  If you have any questions or concerns before your next appointment please send [place] a message through [place] or call our office at [phone].    TO LEAVE A MESSAGE FOR THE NURSE SELECT OPTION 2, PLEASE LEAVE A MESSAGE INCLUDING: . YOUR NAME . DATE OF BIRTH . CALL BACK NUMBER . REASON FOR CALL**this is important as we prioritize the call backs  [place] AS LONG AS YOU CALL BEFORE 4:00 PM  At the [facility], you and your health needs are our priority. As part of our continuing mission to provide you with exceptional heart care, we have created designated Provider Care Teams. These Care Teams include your primary Cardiologist (physician) and Advanced Practice Providers (APPs- Physician Assistants and Nurse Practitioners) who all work together to provide you with the care you need, when you need it.   You may see any of the following providers on your designated Care Team at your next follow up: [name] Dr [name] . Dr Loralie Champagne . Dr Vickki Muff . Darrick Grinder, NP . Lyda Jester, [place] . Audry Riles, PharmD   Please be sure to bring in all your medications bottles to every appointment.

## 2020-11-02 NOTE — Progress Notes (Signed)
mnnPatient ID: Linda Stevens, female   DOB: [birth year], 75 y.o.   MRN: [id number]  [facility]IN[facility] NOTE  Patient ID: Linda Stevens, female   DOB: 03/01/1946, 75 y.o.   MRN: [id number] Primary Cardiologist: Dr Haroldine Laws  Pulmonary: Dr Melvyn Novas PCP: Diona Browner   HPI:  Linda Stevens is  75 y/o woman with a h/o HTN, COPD (former smoker quit 2015), seasonal allergies, asthma, pulmonary HTN, and diastolic dysfxn.  Admitted 1/17 with progressive DOE, hypoxia, and lower ext edema and found to have severe PAH. R/LHC 10/08/15 with normal coronaries, normal CO, and severe PAH PAP 104/49 with 13.6 WU. PAH well out of proportion to left-sided filling pressures and COPD. Started revatio 20 TID with consideration for macitentan as outpatient. PFTs 10/05/15  FEV1 0.91 (40%)FVC 1.64 (55%), DLCO 8.80 (38%). Auto-immune and infectious serologies negative. VQ negative. Discharge weight 148 pounds.   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 Has been drained twice. Underwent first thoracentesis on 11/7.  Transudative. CT scan 07/24/17 showed large R effusion and small pericardial effusion. +COPD.  Underwent repeat thoracentesis on 07/29/17 with 310cc out.  Cytology negative. F/u CXR 11/21 showed small bilateral effusions.  RF markedly ++ but other serology negative. Saw Dr. Amil Amen who said she did not have RA but was diagnosed with polyarthralgia/OA.   Admitted 6/21 with respiratory failure. Echo EF 55-60% with mild RV HK with large peridardial effusion. Suspicion for auto-immune flare. ESR 63. Auto-immune panel again negative. Treated with steroids with prompt improvement and regression of pericardial effusion. Had PAF Went to SNF.  Seen in clinic 7/21. Feeling much better.Able to do ADLs without problem. No edema, orthopnea or PND. Weight stable. Complaint with meds. No palpitations. No bleeding with apixaban.   Readmitted 9/21 with Hgb  3.1. Underwent  EGD and colonoscopy, concern for esophageal candidiasis, multiple polyps removed from the colon and colonic AVMs noted which is suspected to be the cause of ongoing bleeding in the setting of Eliquis. Eliquis stopped. Transfused a total of 4 units of PRBC this admission and given IV iron.  Here with her husband for f/u. Has had nasal congestion for 7-8 days. No fevers or chills. All clear sputum. Thinks it is allergies. No longer on prednisone and Rheumatology told her she does not have CTD. Denies edema, orthopnea or PND.    Studies:   Echo 4/21 LVEF 70-75% RV normal . No effusion. Personally reviewed  Echo 4/19 EF 65% RV normal. No effusion Personally reviewed Echo 5/18 EF 60-65% RV completely normal. No TR to measure RVSP. No effusion  Echo 3/17 EF 60-65% septum still flattened. RV size improved moderate HK. RVSP ~80. IVC small. Pericardial effusion essentially resolved   CXR 09/28/17 Small bilateral effusions  PAH regimen: 1. Adcirca 40 2. Letairis 10 (Switched to [name] as it was thought that sinusitis might be due to macitentan.)  6MW 8/17; 840 ft (256 M) on 2 L O2, sats ranged 83-95%, HR ranged 96-111. 6MW 9/19 780 ft =237.754mters On 3L of O2 Starting O2 94% Ending O2-85%  Starting heart rate-80 Ending heart rate- 110  RHC 12/18 RA = 5 RV = 56/9 PA = 52/17 (33) PCW = 10 Fick cardiac output/index = 7.5/4.5 PVR = 3.5 WU Ao sat = 96% PA sat = 73%, 72% High SVC sat = 76%  VQ scan low prob. CT chest mild COPD. Moderate pericardial effusion. 3v CAD. + cirrhosis. Hepatitis serologies negative.   PFTs  (  FEV1 1.23 (52%) with FVC 2.01 (65%) and DLCO 51% in 7/15) PFT's 10/08/2015 FEV1 0.91 (40 %) ratio 56%DLCO 38 % corrects to 63 % for alv volume  PFTs  12/06/15 FEV1 1.0 (44%) FVC 1.74 (58%0 DLCO 28%  R/LHC 10/08/15 with normal coronaries, normal CO, and sever PAH with 13.6 WU.  Findings: Ao = 108/68 (86) LV = 102/10/11 RA = 9 RV = 97/8/12 PA = 104/49 (72) PCW =  18 Fick cardiac output/index = 3.96/2.34 PVR = 13.6 WU FA sat = 93% PA sat = 66%, 69%  RHC 5/18 RA = 2 RV = 48/4 PA = 45/17 (30) PCW = 8 Fick cardiac output/index = 4.9/3.0 PVR = 4.4 WU Ao sat = 94% PA sat = 68%, 70%  Review of systems complete and found to be negative unless listed in HPI.   SH:  Social History   Socioeconomic History  . Marital status: Married    Spouse name: Not on file  . Number of children: Not on file  . Years of education: 2  . Highest education level: Not on file  Occupational History  . Occupation: retired Pharmacist, hospital  Tobacco Use  . Smoking status: Former Smoker    Packs/day: 1.00    Years: 40.00    Pack years: 40.00    Types: Cigarettes    Quit date: 02/22/2014    Years since quitting: 6.6  . Smokeless tobacco: Never Used  Vaping Use  . Vaping Use: Never used  Substance and Sexual Activity  . Alcohol use: No  . Drug use: No  . Sexual activity: Never  Other Topics Concern  . Not on file  Social History Narrative   Married with 2 children.  Independent of ADLs.      Does not have a living will.   Would desire CPR but would not want prolonged life support if futile- husband aware.   Social Determinants of Health   Financial Resource Strain: Not on file  Food Insecurity: Not on file  Transportation Needs: Not on file  Physical Activity: Not on file  Stress: Not on file  Social Connections: Not on file  Intimate Partner Violence: Not on file    FH:  Family History  Problem Relation Age of Onset  . Glaucoma Brother   . Vascular Disease Father        Died of complications from surgery  . Heart attack Father   . Asthma Mother        Died of asthma attack  . Dementia Mother   . Hyperlipidemia Mother   . Hypertension Mother     Past Medical History:  Diagnosis Date  . Acute respiratory failure with hypoxia and hypercapnea 02/26/2014  . Allergic rhinitis due to pollen   . Allergy   . Anemia 06/12/2020  . Arthritis   .  Asthma   . CHF (congestive heart failure) ([place])   . COPD (chronic obstructive pulmonary disease) ([place])   . Diastolic dysfunction    a. 02/2014 Echo: EF 65-70%, Gr 1 DD, RVH;  b. 09/2015 Echo: EF 55%, Gr 1 DD.  [name] Emphysema of lung ([place])   . H/O seasonal allergies   . History of chicken pox   . History of tobacco abuse    a. Quit 2015.  [name] History of UTI   . Hypertension   . Hypertensive heart disease   . Lower extremity edema   . Pericardial effusion    a. 09/2015 Echo: Mod eff w/o tamponade.  [name]  Right heart failure with reduced right ventricular function ([place])    a. 09/2015 Echo: EF 55%, Gr 1 DD, D shaped IV septum, sev dil RV with mod reduced fxn, mod dil RA, RV-RA grad 41mHg, PASP 846mg, mod pericard eff w/o tamponade.  . Severe Pulmonary Hypertension    a. 09/2015 Echo: PASP 8769m.    Current Outpatient Medications  Medication Sig Dispense Refill  . acetaminophen (TYLENOL) 325 MG tablet Take 325 mg by mouth at bedtime as needed for moderate pain or headache.     . albuterol (VENTOLIN HFA) 108 (90 Base) MCG/ACT inhaler INHALE 2 PUFFS INTO THE LUNGS EVERY 6 HOURS AS NEEDED FOR WHEEZING/SHORTNESS OF BREATH 36 each 0  . ambrisentan (LETAIRIS) 10 MG tablet TAKE 1 TABLET BY MOUTH DAILY. DO NOT HANDLE IF PREGNANT. DO NOT SPLIT, CRUSH, OR CHEW. AVOID INHALATION AND CONTACT WITH SKIN OR EYES . CALL [phone] REFILL. 90 tablet 3  . amiodarone (PACERONE) 100 MG tablet TAKE 1 TABLET BY MOUTH EVERY DAY 90 tablet 2  . aspirin EC 81 MG EC tablet Take 1 tablet (81 mg total) by mouth daily. 30 tablet 0  . ferrous sulfate 325 (65 FE) MG EC tablet TAKE 1 TABLET (325 MG TOTAL) BY MOUTH 2 (TWO) TIMES DAILY WITH A MEAL. 60 tablet 3  . furosemide (LASIX) 40 MG tablet Take 40 mg by mouth 2 (two) times daily.    . KMarland KitchenOR-CON M20 20 MEQ tablet TAKE 1 TABLET BY MOUTH EVERY DAY 90 tablet 1  . OXYGEN Inhale into the lungs. Using 4 liters    . pantoprazole (PROTONIX) 40 MG tablet TAKE 1 TABLET BY MOUTH EVERY  DAY 90 tablet 3  . spironolactone (ALDACTONE) 25 MG tablet Take 0.5 tablets (12.5 mg total) by mouth daily. 30 tablet 0  . tadalafil (CIALIS) 20 MG tablet Take 2 tablets (40 mg total) by mouth daily. 180 tablet 3   No current facility-administered medications for this encounter.    Vitals:   11/02/20 1342  BP: (!) 124/58  Pulse: 81  SpO2: 100%  Weight: 60.1 kg (132 lb 9.6 oz)   Wt Readings from Last 3 Encounters:  07/02/20 59.9 kg (132 lb)  06/19/20 58.1 kg (128 lb)  06/05/20 56.2 kg (124 lb)     PHYSICAL EXAM: General:  Weak appearing. Wearing O2. + congested HEENT: normal + telengectasias skin dry Neck: supple. no JVD. Carotids 2+ bilat; no bruits. No lymphadenopathy or thryomegaly appreciated. Cor: PMI nondisplaced. Regular rate & rhythm. No rubs, gallops or murmurs. Lungs: clear Abdomen: soft, nontender, nondistended. No hepatosplenomegaly. No bruits or masses. Good bowel sounds. Extremities: no cyanosis, clubbing, rash, edema Neuro: alert & orientedx3, cranial nerves grossly intact. moves all 4 extremities w/o difficulty. Affect pleasant   ASSESSMENT & PLAN:  1. Allergic rhinits - will start prednisone taper  2. Large pericardial effusion - Seen on echo in 6/21. Given PAH, telengectacias and recurrent effusions my suspicion for underlying CTD remains high but serology again negative - Resolved with steroids. Has been off steroids for several months - Echo 11/21 no recurrence.  - I suspect she has underlying CTD but Rheum work-up has been negative  3. PAH with right heart failure - suspect combination of WHO Group I and III PAH - Echo 5/18 with complete resolution of RV strain. No significant TR to estimate RVSP - Echo 4/19 with EF 65% no evidence of RV strain  - echo  12/09/19 EF 70-75% RV normal Personally reviewed - echo  11/21 EF 75% RV normal no evidence PAH - Repeat RHC 12/18 with mild PAH (mean PA 33) - Resolution of PAH on recent echos - Continue Letairis  and tadalafil. Have previously discussed adding selexipeg but pressures down on last cath/echo - Volume ok  - See back in 6 months with echo  4. Recurrent R pleural effusion - Given improvement in PA pressures doubt effusion related to severe PAH/ Previous ab CT with evidence of cirrhosis.   -  Fluid analysis = transudative.  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 Now controlled with with diuresis - No significant recurrence  5. Chronic Respiratory failure - Multifactorial. Continue supplemental O2.  - Follows with Dr. Melvyn Novas.   6. COPD- Gold III  - on inhalers. Follows with Pulmonary. As above. No change  7. Cirrhosis on CT - due to RHF. Hepatitis serologies are negative. Possibly due to right heart failure - given EF > 75% may need to consider propanolol +/- midodrine to avoid high-output HF  8. PAF - in NSR today. Had first occurrence in setting of pericardial effusion in 6/21 - continue amio 123m for now. Stop at next visit - Eliquis stopped due to severe LGIB 9/21 (hgb 3.1). Will not restart with AVMs - check TFTs  9. Recent GIB - due to AVMs - off Eliquis. Will not restart    DGlori Bickers MD  10:58 AM

## 2020-11-03 LAB — T3, FREE: T3, Free: 1.9 pg/mL — ABNORMAL LOW (ref 2.0–4.4)

## 2020-11-05 DIAGNOSIS — J9601 Acute respiratory failure with hypoxia: Secondary | ICD-10-CM | POA: Diagnosis not present

## 2020-11-05 DIAGNOSIS — J449 Chronic obstructive pulmonary disease, unspecified: Secondary | ICD-10-CM | POA: Diagnosis not present

## 2020-11-05 DIAGNOSIS — R062 Wheezing: Secondary | ICD-10-CM | POA: Diagnosis not present

## 2020-11-05 DIAGNOSIS — J45909 Unspecified asthma, uncomplicated: Secondary | ICD-10-CM | POA: Diagnosis not present

## 2020-11-05 NOTE — Telephone Encounter (Signed)
I received a message from the patient's husband stating that [employer] sent communication stating that after a review, they did not have the funding to cover her grant. This would mean her grant was denied after the initial 30 day approval. Letter was dated 2/15. We sent in the requested documentation on 2/11.  [employer]'s automated system stating that she was approved through 09/07/21. I spoke with a representative who confirmed that since the documentation was received before the 30 day cutoff, that the patient was approved through 09/07/21. Disregard the automated letter that was sent to the patient.  Called the patient's husband back with approval.  Charlann Boxer, CPhT

## 2020-11-08 ENCOUNTER — Other Ambulatory Visit: Payer: Self-pay | Admitting: Family Medicine

## 2020-11-08 DIAGNOSIS — R7989 Other specified abnormal findings of blood chemistry: Secondary | ICD-10-CM

## 2020-11-09 ENCOUNTER — Telehealth: Payer: Self-pay | Admitting: Family Medicine

## 2020-11-09 DIAGNOSIS — R7989 Other specified abnormal findings of blood chemistry: Secondary | ICD-10-CM

## 2020-11-09 NOTE — Telephone Encounter (Signed)
Mr. Kohlman notified of elevated TSH.  Lab appointment scheduled for 11/21/2020 at 11:00 am to recheck TSH and some additional thyroid labs.  Future order in Epic.

## 2020-11-09 NOTE — Telephone Encounter (Signed)
Pt husband called in for his wife Linda Stevens returning Butch Penny call

## 2020-11-21 ENCOUNTER — Other Ambulatory Visit: Payer: Self-pay

## 2020-11-21 ENCOUNTER — Other Ambulatory Visit (INDEPENDENT_AMBULATORY_CARE_PROVIDER_SITE_OTHER): Payer: Medicare HMO

## 2020-11-21 ENCOUNTER — Other Ambulatory Visit: Payer: Self-pay | Admitting: Family Medicine

## 2020-11-21 DIAGNOSIS — R7989 Other specified abnormal findings of blood chemistry: Secondary | ICD-10-CM

## 2020-11-21 LAB — TSH: TSH: 9.33 u[IU]/mL — ABNORMAL HIGH (ref 0.35–4.50)

## 2020-11-21 LAB — T3, FREE: T3, Free: 2.5 pg/mL (ref 2.3–4.2)

## 2020-11-21 LAB — T4, FREE: Free T4: 1.12 ng/dL (ref 0.60–1.60)

## 2020-11-21 NOTE — Progress Notes (Signed)
error 

## 2020-11-22 LAB — THYROID PEROXIDASE ANTIBODIES (TPO) (REFL): Thyroperoxidase Ab SerPl-aCnc: <1 IU/mL (ref <9)

## 2020-11-23 ENCOUNTER — Telehealth: Payer: Self-pay

## 2020-11-23 DIAGNOSIS — E039 Hypothyroidism, unspecified: Secondary | ICD-10-CM | POA: Insufficient documentation

## 2020-11-23 MED ORDER — LEVOTHYROXINE SODIUM 50 MCG PO TABS: 50.0000 ug | ORAL_TABLET | Freq: Every day | ORAL | 11 refills | Status: DC

## 2020-11-23 NOTE — Telephone Encounter (Signed)
Linda Stevens left v/m requesting cb today about lab test results.

## 2020-11-23 NOTE — Telephone Encounter (Addendum)
Sent message via [place]. Pt must have not seen. Called ... discussed  Results.  Sent low dose levothyroxine [facility].  She will call to set up labs in 4-6 weeks for TSH, free t3 and free t3

## 2020-11-23 NOTE — Telephone Encounter (Signed)
Mr Starrett left v/m that pt saw thyroid lab results on my chart and pt is very upset with results and request cb ASAP with what the results mean and what to do next. Sending note to Dr Diona Browner and Butch Penny CMA.

## 2020-12-06 DIAGNOSIS — R062 Wheezing: Secondary | ICD-10-CM | POA: Diagnosis not present

## 2020-12-06 DIAGNOSIS — J449 Chronic obstructive pulmonary disease, unspecified: Secondary | ICD-10-CM | POA: Diagnosis not present

## 2020-12-06 DIAGNOSIS — J9601 Acute respiratory failure with hypoxia: Secondary | ICD-10-CM | POA: Diagnosis not present

## 2020-12-06 DIAGNOSIS — J45909 Unspecified asthma, uncomplicated: Secondary | ICD-10-CM | POA: Diagnosis not present

## 2020-12-14 ENCOUNTER — Ambulatory Visit (INDEPENDENT_AMBULATORY_CARE_PROVIDER_SITE_OTHER): Payer: Medicare HMO | Admitting: Family Medicine

## 2020-12-14 ENCOUNTER — Other Ambulatory Visit: Payer: Self-pay

## 2020-12-14 VITALS — BP 116/68 | HR 70 | Temp 97.8°F | Wt 135.0 lb

## 2020-12-14 DIAGNOSIS — R21 Rash and other nonspecific skin eruption: Secondary | ICD-10-CM

## 2020-12-14 DIAGNOSIS — B353 Tinea pedis: Secondary | ICD-10-CM

## 2020-12-14 DIAGNOSIS — B351 Tinea unguium: Secondary | ICD-10-CM | POA: Diagnosis not present

## 2020-12-14 MED ORDER — CICLOPIROX 8 % EX SOLN: Freq: Every day | CUTANEOUS | 0 refills | Status: DC

## 2020-12-14 MED ORDER — KETOCONAZOLE 2 % EX CREA: 1.0000 "application " | TOPICAL_CREAM | Freq: Every day | CUTANEOUS | 0 refills | Status: DC

## 2020-12-14 NOTE — Patient Instructions (Addendum)
Start  Penlac nail polish daily, remove weekly and restart. Apply ketoconazole cream to feet.  Spray shoes with athlete's foot spray.  Start topical antifungal for foot skin infection... continue 48 hours after foot rash is gone. Trim nails to avoid pain.    To rash on arms and left leg.[name] apply topical triamcinolone cream.

## 2020-12-14 NOTE — Progress Notes (Signed)
Patient ID: Linda Stevens, female    DOB: 1946-04-05, 75 y.o.   MRN: [id number]  This visit was conducted in person.  BP 116/68   Pulse 70   Temp 97.8 F (36.6 C) (Temporal)   Wt 135 lb (61.2 kg)   SpO2 92%   BMI 23.54 kg/m    CC:  Chief Complaint  Patient presents with  . Nail Problem    On fingers and toes x 1-2 months    Subjective:   HPI: Linda Stevens is a 75 y.o. female presenting on 12/14/2020 for Nail Problem (On fingers and toes x 1-2 months)  She reports changes in finger naila and toenails in last several months, may have been on ffeet longer but now on fingernails.   Fingernail yellow, splitting.  Dry flaky itchy feet.     Also has noted rash on arms and left leg.. dry scaly itchy.   Relevant past medical, surgical, family and social history reviewed and updated as indicated. Interim medical history since our last visit reviewed. Allergies and medications reviewed and updated. Outpatient Medications Prior to Visit  Medication Sig Dispense Refill  . acetaminophen (TYLENOL) 325 MG tablet Take 325 mg by mouth at bedtime as needed for moderate pain or headache.     . albuterol (VENTOLIN HFA) 108 (90 Base) MCG/ACT inhaler INHALE 2 PUFFS INTO THE LUNGS EVERY 6 HOURS AS NEEDED FOR WHEEZING/SHORTNESS OF BREATH 36 each 0  . ambrisentan (LETAIRIS) 10 MG tablet TAKE 1 TABLET BY MOUTH DAILY. DO NOT HANDLE IF PREGNANT. DO NOT SPLIT, CRUSH, OR CHEW. AVOID INHALATION AND CONTACT WITH SKIN OR EYES . CALL [phone] TO REFILL. 90 tablet 3  . amiodarone (PACERONE) 100 MG tablet TAKE 1 TABLET BY MOUTH EVERY DAY 90 tablet 2  . aspirin EC 81 MG EC tablet Take 1 tablet (81 mg total) by mouth daily. 30 tablet 0  . ferrous sulfate 325 (65 FE) MG EC tablet TAKE 1 TABLET (325 MG TOTAL) BY MOUTH 2 (TWO) TIMES DAILY WITH A MEAL. 60 tablet 3  . furosemide (LASIX) 40 MG tablet Take 40 mg by mouth daily.    [name] KLOR-CON M20 20 MEQ tablet TAKE 1 TABLET BY MOUTH EVERY DAY 90  tablet 1  . levothyroxine (SYNTHROID) 50 MCG tablet Take 1 tablet (50 mcg total) by mouth daily. 30 tablet 11  . OXYGEN Inhale into the lungs. Using 4 liters    . pantoprazole (PROTONIX) 40 MG tablet TAKE 1 TABLET BY MOUTH EVERY DAY 90 tablet 3  . spironolactone (ALDACTONE) 25 MG tablet Take 0.5 tablets (12.5 mg total) by mouth daily. 30 tablet 0  . tadalafil (CIALIS) 20 MG tablet Take 2 tablets (40 mg total) by mouth daily. 180 tablet 3   No facility-administered medications prior to visit.     Per HPI unless specifically indicated in ROS section below Review of Systems  Constitutional: Negative for fatigue and fever.  HENT: Negative for congestion.   Eyes: Negative for pain.  Respiratory: Negative for cough and shortness of breath.   Cardiovascular: Negative for chest pain, palpitations and leg swelling.  Gastrointestinal: Negative for abdominal pain.  Genitourinary: Negative for dysuria and vaginal bleeding.  Musculoskeletal: Negative for back pain.  Neurological: Negative for syncope, light-headedness and headaches.  Psychiatric/Behavioral: Negative for dysphoric mood.   Objective:  BP 116/68   Pulse 70   Temp 97.8 F (36.6 C) (Temporal)   Wt 135 lb (61.2 kg)   SpO2 92%  BMI 23.54 kg/m   Wt Readings from Last 3 Encounters:  12/14/20 135 lb (61.2 kg)  11/02/20 132 lb 9.6 oz (60.1 kg)  07/02/20 132 lb (59.9 kg)      Physical Exam Constitutional:      General: She is not in acute distress.    Appearance: Normal appearance. She is well-developed. She is not ill-appearing or toxic-appearing.  HENT:     Head: Normocephalic.     Right Ear: Hearing, tympanic membrane, ear canal and external ear normal. Tympanic membrane is not erythematous, retracted or bulging.     Left Ear: Hearing, tympanic membrane, ear canal and external ear normal. Tympanic membrane is not erythematous, retracted or bulging.     Nose: No mucosal edema or rhinorrhea.     Right Sinus: No maxillary  sinus tenderness or frontal sinus tenderness.     Left Sinus: No maxillary sinus tenderness or frontal sinus tenderness.     Mouth/Throat:     Pharynx: Uvula midline.  Eyes:     General: Lids are normal. Lids are everted, no foreign bodies appreciated.     Conjunctiva/sclera: Conjunctivae normal.     Pupils: Pupils are equal, round, and reactive to light.  Neck:     Thyroid: No thyroid mass or thyromegaly.     Vascular: No carotid bruit.     Trachea: Trachea normal.  Cardiovascular:     Rate and Rhythm: Normal rate and regular rhythm.     Pulses: Normal pulses.     Heart sounds: Normal heart sounds, S1 normal and S2 normal. No murmur heard. No friction rub. No gallop.   Pulmonary:     Effort: Pulmonary effort is normal. No tachypnea or respiratory distress.     Breath sounds: Normal breath sounds. No decreased breath sounds, wheezing, rhonchi or rales.  Abdominal:     General: Bowel sounds are normal.     Palpations: Abdomen is soft.     Tenderness: There is no abdominal tenderness.  Musculoskeletal:     Cervical back: Normal range of motion and neck supple.  Skin:    General: Skin is warm and dry.     Findings: Erythema and rash present. Rash is macular and scaling.     Comments: No skin change   Nails on hands and feet.. thickened and discolored with subungual debris  Neurological:     Mental Status: She is alert.  Psychiatric:        Mood and Affect: Mood is not anxious or depressed.        Speech: Speech normal.        Behavior: Behavior normal. Behavior is cooperative.        Thought Content: Thought content normal.        Judgment: Judgment normal.       Results for orders placed or performed in visit on 11/21/20  Thyroid Peroxidase Antibodies (TPO) (REFL)  Result Value Ref Range   Thyroperoxidase Ab SerPl-aCnc <1 <9 IU/mL  T3, free  Result Value Ref Range   T3, Free 2.5 2.3 - 4.2 pg/mL  T4, free  Result Value Ref Range   Free T4 1.12 0.60 - 1.60 ng/dL  TSH   Result Value Ref Range   TSH 9.33 (H) 0.35 - 4.50 uIU/mL    This visit occurred during the SARS-CoV-2 public health emergency.  Safety protocols were in place, including screening questions prior to the visit, additional usage of staff PPE, and extensive cleaning of exam room while observing  appropriate contact time as indicated for disinfecting solutions.   COVID 19 screen:  No recent travel or known exposure to COVID19 The patient denies respiratory symptoms of COVID 19 at this time. The importance of social distancing was discussed today.   Assessment and Plan Problem List Items Addressed This Visit    Onychomycosis - Primary    Start  Penlac nail polish daily, remove weekly and restart. Apply ketoconazole cream to feet.  Spray shoes with athlete's foot spray.  Trim nails to avoid pain.        Relevant Medications   ciclopirox (PENLAC) 8 % solution   ketoconazole (NIZORAL) 2 % cream   Skin rash   Tinea pedis of both feet    Start topical antifungal for foot skin infection... continue 48 hours after foot rash is gone.       Relevant Medications   ciclopirox (PENLAC) 8 % solution   ketoconazole (NIZORAL) 2 % cream         Eliezer Lofts, MD

## 2020-12-20 ENCOUNTER — Ambulatory Visit: Payer: Medicare HMO | Admitting: Family Medicine

## 2020-12-21 ENCOUNTER — Other Ambulatory Visit (HOSPITAL_COMMUNITY): Payer: Self-pay | Admitting: Internal Medicine

## 2020-12-23 ENCOUNTER — Other Ambulatory Visit (HOSPITAL_COMMUNITY): Payer: Self-pay | Admitting: Internal Medicine

## 2020-12-26 ENCOUNTER — Other Ambulatory Visit (INDEPENDENT_AMBULATORY_CARE_PROVIDER_SITE_OTHER): Payer: Medicare HMO

## 2020-12-26 ENCOUNTER — Other Ambulatory Visit: Payer: Self-pay

## 2020-12-26 ENCOUNTER — Other Ambulatory Visit: Payer: Self-pay | Admitting: Family Medicine

## 2020-12-26 DIAGNOSIS — E039 Hypothyroidism, unspecified: Secondary | ICD-10-CM | POA: Diagnosis not present

## 2020-12-26 LAB — T3, FREE: T3, Free: 2.2 pg/mL — ABNORMAL LOW (ref 2.3–4.2)

## 2020-12-26 LAB — TSH: TSH: 4.75 u[IU]/mL — ABNORMAL HIGH (ref 0.35–4.50)

## 2020-12-26 LAB — T4, FREE: Free T4: 1.45 ng/dL (ref 0.60–1.60)

## 2020-12-26 NOTE — Telephone Encounter (Signed)
Labs already ordered.

## 2020-12-26 NOTE — Telephone Encounter (Signed)
Patient is coming in today for repeat labs. EM

## 2020-12-28 ENCOUNTER — Other Ambulatory Visit: Payer: Self-pay | Admitting: Family Medicine

## 2020-12-31 ENCOUNTER — Telehealth: Payer: Self-pay | Admitting: Internal Medicine

## 2020-12-31 ENCOUNTER — Other Ambulatory Visit: Payer: Self-pay | Admitting: Adult Health

## 2020-12-31 DIAGNOSIS — J9611 Chronic respiratory failure with hypoxia: Secondary | ICD-10-CM

## 2020-12-31 NOTE — Telephone Encounter (Signed)
Wow, yes please send a order STAT for them to look at [facility] that patient has been paying for and been on long term and is part of her medical treatment plan for  >33yr   Please discuss with them if not able to look at today this will be an emergency since back up tank will not last for >24hrs .  They will need to [place] if not able to complete order.

## 2020-12-31 NOTE — Telephone Encounter (Signed)
Called and spoke with patient's spouse [name]. He stated that the patient O2 concentrator stopped working this morning. She is currently using her backup O2 tank. He called [employer] to ask them to come and out to look at the concentrator. Per [name], [employer] stated that they can not do anything without an order.   Patient was last seen by MW on 02/13/20. Was advised to follow up in 6 months but has not yet. Per Delfino Lovett, she is on 4L of O2. Patient has seen TP before but was back in 2017.   MW is out of the office today.   TP, would you be ok with [place] placing an order for [employer] to come and service her concentrator?

## 2020-12-31 NOTE — Telephone Encounter (Signed)
I called and spoke with Melissa with [employer] and she is going to email the office and get them to send someone out to check the concentrator.  Pt is currently using her E tank and I have given the local number to [employer] [facility].  I advised him that if he does not hear anything from them soon, to call the number given and they will help him out.  He is to call back if anything further is needed.

## 2021-01-05 DIAGNOSIS — J9601 Acute respiratory failure with hypoxia: Secondary | ICD-10-CM | POA: Diagnosis not present

## 2021-01-05 DIAGNOSIS — J45909 Unspecified asthma, uncomplicated: Secondary | ICD-10-CM | POA: Diagnosis not present

## 2021-01-05 DIAGNOSIS — R062 Wheezing: Secondary | ICD-10-CM | POA: Diagnosis not present

## 2021-01-05 DIAGNOSIS — J449 Chronic obstructive pulmonary disease, unspecified: Secondary | ICD-10-CM | POA: Diagnosis not present

## 2021-01-16 DIAGNOSIS — R21 Rash and other nonspecific skin eruption: Secondary | ICD-10-CM | POA: Insufficient documentation

## 2021-01-16 DIAGNOSIS — B353 Tinea pedis: Secondary | ICD-10-CM | POA: Insufficient documentation

## 2021-01-16 DIAGNOSIS — B351 Tinea unguium: Secondary | ICD-10-CM | POA: Insufficient documentation

## 2021-01-16 NOTE — Assessment & Plan Note (Signed)
Start topical antifungal for foot skin infection... continue 48 hours after foot rash is gone.

## 2021-01-16 NOTE — Assessment & Plan Note (Signed)
Start  Penlac nail polish daily, remove weekly and restart. Apply ketoconazole cream to feet.  Spray shoes with athlete's foot spray.  Trim nails to avoid pain.

## 2021-01-16 NOTE — Assessment & Plan Note (Signed)
Unclear cause of rash on arms and leg... treat with topical steroid cream

## 2021-01-20 ENCOUNTER — Other Ambulatory Visit: Payer: Self-pay | Admitting: Family Medicine

## 2021-01-21 ENCOUNTER — Telehealth: Payer: Self-pay | Admitting: *Deleted

## 2021-01-21 DIAGNOSIS — E039 Hypothyroidism, unspecified: Secondary | ICD-10-CM

## 2021-01-21 NOTE — Telephone Encounter (Signed)
If patient and husband agreeable... call in rx for slightly higher dose of levothyroxine ... 75 mcg daily # 30 , 11 RF  And schedule he for repeat  lab only check of thyroid hormone in 4 weeks.

## 2021-01-21 NOTE — Telephone Encounter (Signed)
Patient's husband (on [place]) called stating that he feels that his wife's thyroid medication is not working. Patient's husband stated that his wife has been sleeping about 19 hours a day. Patient's husband stated that this has been going on for about 2-3 weeks. Patient's husband denies that his wife has any other symptoms other than being tired and sleeping a lot.  Pharmacy [facility]

## 2021-01-22 MED ORDER — LEVOTHYROXINE SODIUM 75 MCG PO TABS: 75.0000 ug | ORAL_TABLET | Freq: Every day | ORAL | 11 refills | Status: DC

## 2021-01-22 NOTE — Telephone Encounter (Signed)
Patient called and was asked if they were in aggreeance  with Dr. Rometta Emery recommendations to increase her medication. Patient's husband stated that they were, he was also asked about the lab visit scheduling but he wants to get home and look at a calender before calling back to schedule that appointment. Patient stated his understanding and his appreciation.

## 2021-01-22 NOTE — Telephone Encounter (Signed)
Noted. Medicaiton sent to pharmacy already and labs ordered for repeat eval in 4 weeks.

## 2021-01-29 NOTE — Telephone Encounter (Signed)
Spouse called with an update on pt. He said once PCP increased her synthroid to 75 mcg pt's sleeping all the time worsened. He could hardly keep pt up so he took her off of the synthroid completely. Pt hasn't taken any since this weekend and spouse said pt is "back to her normal self" she is only taking 1 or 2 30 min naps during the day and is up most of the time. Spouse thinks that the excessive sleeping was from the synthroid and said he isn't going to put pt back on med. He just wanted PCP to be aware

## 2021-01-29 NOTE — Telephone Encounter (Signed)
It is dangerous for her to be off the synthroid all together.  Untreated hypothyroidism can cause coma eventually if severe. Please ask if he will restart her back but at her previous dose and we can follow it. It really should not make her tired.  If he would like an ENDO consult to further evaluate the need for thyroid medication I would be happy to set one up.

## 2021-01-30 ENCOUNTER — Other Ambulatory Visit: Payer: Self-pay

## 2021-01-30 ENCOUNTER — Other Ambulatory Visit (INDEPENDENT_AMBULATORY_CARE_PROVIDER_SITE_OTHER): Payer: Medicare HMO

## 2021-01-30 DIAGNOSIS — E039 Hypothyroidism, unspecified: Secondary | ICD-10-CM

## 2021-01-30 NOTE — Telephone Encounter (Signed)
That would be okay, but  why don't we just start with her coming in to have thyroid labs checked off the medication to verify there is still an issue.[name] Please release orders already in computer.

## 2021-01-30 NOTE — Telephone Encounter (Signed)
Left message for Linda Stevens to return my call.

## 2021-01-30 NOTE — Telephone Encounter (Signed)
Mr. Linda Stevens notified as instructed by telephone.  He states these symptoms started when patient was on the 50 mcg and got worse with the 75 mcg.  Since stopping the medication patient has been doing much better.    Patient states she is not going to take this medication anymore.  They are asking if it is possible to try 25 mcg. Please advise.

## 2021-01-30 NOTE — Telephone Encounter (Signed)
Mr. Kathol notified as instructed by telephone.  Lab appointment scheduled today at 3:30 pm.

## 2021-01-31 ENCOUNTER — Telehealth: Payer: Self-pay | Admitting: *Deleted

## 2021-01-31 LAB — T3, FREE: T3, Free: 1.9 pg/mL — ABNORMAL LOW (ref 2.3–4.2)

## 2021-01-31 LAB — TSH: TSH: 4.33 u[IU]/mL (ref 0.35–4.50)

## 2021-01-31 LAB — T4, FREE: Free T4: 1.08 ng/dL (ref 0.60–1.60)

## 2021-01-31 NOTE — Addendum Note (Signed)
Addended by: Carter Kitten on: 01/31/2021 03:46 PM   Modules accepted: Orders

## 2021-01-31 NOTE — Telephone Encounter (Addendum)
Linda Stevens left voicemail on triage stating they got the thyroid results but don't understand them  I spoke with Linda Stevens and advised Free T3 is still low but TSH is improved and in the normal range. Given Linda Stevens felt terrible on the thyroid med, she can stop it and we will just follow it.  Will plan on rechecking labs in 3 months.  Patient states understanding.   I advised them that Dr. Diona Browner was reassured by the labs.  Medication list updated.

## 2021-02-05 DIAGNOSIS — J9601 Acute respiratory failure with hypoxia: Secondary | ICD-10-CM | POA: Diagnosis not present

## 2021-02-05 DIAGNOSIS — R062 Wheezing: Secondary | ICD-10-CM | POA: Diagnosis not present

## 2021-02-05 DIAGNOSIS — J449 Chronic obstructive pulmonary disease, unspecified: Secondary | ICD-10-CM | POA: Diagnosis not present

## 2021-02-05 DIAGNOSIS — J45909 Unspecified asthma, uncomplicated: Secondary | ICD-10-CM | POA: Diagnosis not present

## 2021-02-26 ENCOUNTER — Other Ambulatory Visit: Payer: Self-pay | Admitting: Family Medicine

## 2021-02-26 NOTE — Telephone Encounter (Signed)
Please try and schedule MWV with Colandra and CPE with Dr. Diona Browner with fasting labs prior for sometime after March 26, 2021.

## 2021-02-27 NOTE — Telephone Encounter (Signed)
Left voice message to call the office

## 2021-03-07 DIAGNOSIS — R062 Wheezing: Secondary | ICD-10-CM | POA: Diagnosis not present

## 2021-03-07 DIAGNOSIS — J45909 Unspecified asthma, uncomplicated: Secondary | ICD-10-CM | POA: Diagnosis not present

## 2021-03-07 DIAGNOSIS — J449 Chronic obstructive pulmonary disease, unspecified: Secondary | ICD-10-CM | POA: Diagnosis not present

## 2021-03-07 DIAGNOSIS — J9601 Acute respiratory failure with hypoxia: Secondary | ICD-10-CM | POA: Diagnosis not present

## 2021-03-13 NOTE — Telephone Encounter (Signed)
Left voice message to call the office

## 2021-03-19 ENCOUNTER — Telehealth (HOSPITAL_COMMUNITY): Payer: Self-pay | Admitting: Pharmacy Technician

## 2021-03-19 MED ORDER — TADALAFIL 20 MG PO TABS: 40.0000 mg | ORAL_TABLET | Freq: Every day | ORAL | 3 refills | Status: DC

## 2021-03-19 NOTE — Telephone Encounter (Signed)
Advanced Heart Failure Patient Advocate Encounter  I received a message from the patient's husband that he was having issues getting her Tadalafil at [facility].   I called [facility] to confirm if they had an active RX for the patient. They do not. Sent Heather (RN) request to send in 90 day supply of Tadalafil for the patient. Informed [facility] that the TAF grant will also pay for the Tadalafil.   Patient's husband aware.  Charlann Boxer, CPhT

## 2021-03-19 NOTE — Addendum Note (Signed)
Addended by: Scarlette Calico on: 03/19/2021 04:32 PM   Modules accepted: Orders

## 2021-03-25 NOTE — Telephone Encounter (Signed)
Left voice message to call the office . Letter sent

## 2021-04-04 ENCOUNTER — Telehealth: Payer: Self-pay | Admitting: Family Medicine

## 2021-04-04 NOTE — Chronic Care Management (AMB) (Signed)
  Chronic Care Management   Outreach Note  04/04/2021 Name: Linda Stevens MRN: [id number] DOB: April 06, 1946  Referred by: [name], MD Reason for referral : No chief complaint on file.   An unsuccessful telephone outreach was attempted today. The patient was referred to the pharmacist for assistance with care management and care coordination.   Follow Up Plan:   Tatjana Dellinger Upstream Scheduler

## 2021-04-07 DIAGNOSIS — J449 Chronic obstructive pulmonary disease, unspecified: Secondary | ICD-10-CM | POA: Diagnosis not present

## 2021-04-07 DIAGNOSIS — R062 Wheezing: Secondary | ICD-10-CM | POA: Diagnosis not present

## 2021-04-07 DIAGNOSIS — J9601 Acute respiratory failure with hypoxia: Secondary | ICD-10-CM | POA: Diagnosis not present

## 2021-04-07 DIAGNOSIS — J45909 Unspecified asthma, uncomplicated: Secondary | ICD-10-CM | POA: Diagnosis not present

## 2021-04-10 ENCOUNTER — Other Ambulatory Visit (HOSPITAL_COMMUNITY): Payer: Self-pay | Admitting: Internal Medicine

## 2021-04-15 ENCOUNTER — Telehealth: Payer: Self-pay | Admitting: Family Medicine

## 2021-04-15 NOTE — Chronic Care Management (AMB) (Signed)
  Chronic Care Management   Outreach Note  04/15/2021 Name: Linda Stevens MRN: [id number] DOB: 02-04-1946  Referred by: [name], MD Reason for referral : No chief complaint on file.   A second unsuccessful telephone outreach was attempted today. The patient was referred to pharmacist for assistance with care management and care coordination.  Follow Up Plan:   Tatjana Dellinger Upstream Scheduler

## 2021-04-22 ENCOUNTER — Telehealth: Payer: Self-pay | Admitting: Family Medicine

## 2021-04-22 NOTE — Chronic Care Management (AMB) (Signed)
  Chronic Care Management   Outreach Note  04/22/2021 Name: Linda Stevens MRN: [id number] DOB: 28-Dec-1945  Referred by: [name], MD Reason for referral : No chief complaint on file.   Third unsuccessful telephone outreach was attempted today. The patient was referred to the pharmacist for assistance with care management and care coordination.   Follow Up Plan:   Tatjana Dellinger Upstream Scheduler

## 2021-05-03 ENCOUNTER — Inpatient Hospital Stay (HOSPITAL_COMMUNITY)
Admission: EM | Admit: 2021-05-03 | Discharge: 2021-05-08 | DRG: 291 | Disposition: A | Discharge status: Home Health | Payer: Medicare HMO | Attending: Internal Medicine | Admitting: Internal Medicine

## 2021-05-03 ENCOUNTER — Telehealth: Payer: Self-pay

## 2021-05-03 ENCOUNTER — Encounter (HOSPITAL_COMMUNITY): Payer: Self-pay | Admitting: Emergency Medicine

## 2021-05-03 ENCOUNTER — Inpatient Hospital Stay (HOSPITAL_COMMUNITY): Payer: Medicare HMO

## 2021-05-03 ENCOUNTER — Ambulatory Visit: Payer: Medicare HMO | Admitting: Family Medicine

## 2021-05-03 ENCOUNTER — Emergency Department (HOSPITAL_COMMUNITY): Payer: Medicare HMO

## 2021-05-03 ENCOUNTER — Other Ambulatory Visit: Payer: Self-pay

## 2021-05-03 DIAGNOSIS — N1832 Chronic kidney disease, stage 3b: Secondary | ICD-10-CM | POA: Diagnosis present

## 2021-05-03 DIAGNOSIS — I480 Paroxysmal atrial fibrillation: Secondary | ICD-10-CM | POA: Diagnosis present

## 2021-05-03 DIAGNOSIS — Z83511 Family history of glaucoma: Secondary | ICD-10-CM | POA: Diagnosis not present

## 2021-05-03 DIAGNOSIS — I959 Hypotension, unspecified: Secondary | ICD-10-CM | POA: Diagnosis present

## 2021-05-03 DIAGNOSIS — K7460 Unspecified cirrhosis of liver: Secondary | ICD-10-CM | POA: Diagnosis present

## 2021-05-03 DIAGNOSIS — N179 Acute kidney failure, unspecified: Secondary | ICD-10-CM | POA: Diagnosis not present

## 2021-05-03 DIAGNOSIS — J810 Acute pulmonary edema: Secondary | ICD-10-CM

## 2021-05-03 DIAGNOSIS — Z9981 Dependence on supplemental oxygen: Secondary | ICD-10-CM

## 2021-05-03 DIAGNOSIS — Z83438 Family history of other disorder of lipoprotein metabolism and other lipidemia: Secondary | ICD-10-CM | POA: Diagnosis not present

## 2021-05-03 DIAGNOSIS — I509 Heart failure, unspecified: Secondary | ICD-10-CM | POA: Diagnosis not present

## 2021-05-03 DIAGNOSIS — Z66 Do not resuscitate: Secondary | ICD-10-CM | POA: Diagnosis not present

## 2021-05-03 DIAGNOSIS — D696 Thrombocytopenia, unspecified: Secondary | ICD-10-CM | POA: Diagnosis present

## 2021-05-03 DIAGNOSIS — Z825 Family history of asthma and other chronic lower respiratory diseases: Secondary | ICD-10-CM

## 2021-05-03 DIAGNOSIS — I700 Atherosclerosis of aorta: Secondary | ICD-10-CM | POA: Diagnosis not present

## 2021-05-03 DIAGNOSIS — E039 Hypothyroidism, unspecified: Secondary | ICD-10-CM | POA: Diagnosis not present

## 2021-05-03 DIAGNOSIS — D631 Anemia in chronic kidney disease: Secondary | ICD-10-CM | POA: Diagnosis present

## 2021-05-03 DIAGNOSIS — Z87891 Personal history of nicotine dependence: Secondary | ICD-10-CM | POA: Diagnosis not present

## 2021-05-03 DIAGNOSIS — Z8249 Family history of ischemic heart disease and other diseases of the circulatory system: Secondary | ICD-10-CM

## 2021-05-03 DIAGNOSIS — Z20822 Contact with and (suspected) exposure to covid-19: Secondary | ICD-10-CM | POA: Diagnosis not present

## 2021-05-03 DIAGNOSIS — B370 Candidal stomatitis: Secondary | ICD-10-CM

## 2021-05-03 DIAGNOSIS — J432 Centrilobular emphysema: Secondary | ICD-10-CM | POA: Diagnosis present

## 2021-05-03 DIAGNOSIS — I4430 Unspecified atrioventricular block: Secondary | ICD-10-CM | POA: Diagnosis not present

## 2021-05-03 DIAGNOSIS — I5032 Chronic diastolic (congestive) heart failure: Secondary | ICD-10-CM | POA: Diagnosis present

## 2021-05-03 DIAGNOSIS — I2723 Pulmonary hypertension due to lung diseases and hypoxia: Secondary | ICD-10-CM | POA: Diagnosis not present

## 2021-05-03 DIAGNOSIS — Z888 Allergy status to other drugs, medicaments and biological substances status: Secondary | ICD-10-CM

## 2021-05-03 DIAGNOSIS — I313 Pericardial effusion (noninflammatory): Secondary | ICD-10-CM

## 2021-05-03 DIAGNOSIS — E0590 Thyrotoxicosis, unspecified without thyrotoxic crisis or storm: Secondary | ICD-10-CM | POA: Diagnosis present

## 2021-05-03 DIAGNOSIS — E785 Hyperlipidemia, unspecified: Secondary | ICD-10-CM | POA: Diagnosis present

## 2021-05-03 DIAGNOSIS — R0902 Hypoxemia: Secondary | ICD-10-CM | POA: Diagnosis not present

## 2021-05-03 DIAGNOSIS — I5033 Acute on chronic diastolic (congestive) heart failure: Secondary | ICD-10-CM | POA: Diagnosis not present

## 2021-05-03 DIAGNOSIS — I130 Hypertensive heart and chronic kidney disease with heart failure and stage 1 through stage 4 chronic kidney disease, or unspecified chronic kidney disease: Principal | ICD-10-CM | POA: Diagnosis present

## 2021-05-03 DIAGNOSIS — R0602 Shortness of breath: Secondary | ICD-10-CM | POA: Diagnosis not present

## 2021-05-03 DIAGNOSIS — J439 Emphysema, unspecified: Secondary | ICD-10-CM | POA: Diagnosis not present

## 2021-05-03 DIAGNOSIS — Z743 Need for continuous supervision: Secondary | ICD-10-CM | POA: Diagnosis not present

## 2021-05-03 DIAGNOSIS — R9431 Abnormal electrocardiogram [ECG] [EKG]: Secondary | ICD-10-CM | POA: Diagnosis not present

## 2021-05-03 DIAGNOSIS — Z7982 Long term (current) use of aspirin: Secondary | ICD-10-CM

## 2021-05-03 DIAGNOSIS — Z20828 Contact with and (suspected) exposure to other viral communicable diseases: Secondary | ICD-10-CM | POA: Diagnosis not present

## 2021-05-03 DIAGNOSIS — Z881 Allergy status to other antibiotic agents status: Secondary | ICD-10-CM

## 2021-05-03 DIAGNOSIS — I517 Cardiomegaly: Secondary | ICD-10-CM | POA: Diagnosis not present

## 2021-05-03 DIAGNOSIS — I5031 Acute diastolic (congestive) heart failure: Secondary | ICD-10-CM | POA: Diagnosis not present

## 2021-05-03 DIAGNOSIS — J9621 Acute and chronic respiratory failure with hypoxia: Secondary | ICD-10-CM | POA: Diagnosis not present

## 2021-05-03 DIAGNOSIS — Z79899 Other long term (current) drug therapy: Secondary | ICD-10-CM

## 2021-05-03 DIAGNOSIS — I440 Atrioventricular block, first degree: Secondary | ICD-10-CM | POA: Diagnosis not present

## 2021-05-03 DIAGNOSIS — J90 Pleural effusion, not elsewhere classified: Secondary | ICD-10-CM | POA: Diagnosis not present

## 2021-05-03 DIAGNOSIS — E875 Hyperkalemia: Secondary | ICD-10-CM | POA: Diagnosis not present

## 2021-05-03 LAB — BLOOD GAS, VENOUS
Acid-Base Excess: 4.3 mmol/L — ABNORMAL HIGH (ref 0.0–2.0)
Bicarbonate: 30.6 mmol/L — ABNORMAL HIGH (ref 20.0–28.0)
Drawn by: 1444
FIO2: 44
O2 Saturation: 60.5 %
Patient temperature: 37
pCO2, Ven: 67.9 mmHg — ABNORMAL HIGH (ref 44.0–60.0)
pH, Ven: 7.277 (ref 7.250–7.430)
pO2, Ven: 36 mmHg (ref 32.0–45.0)

## 2021-05-03 LAB — I-STAT VENOUS BLOOD GAS, ED
Acid-Base Excess: 5 mmol/L — ABNORMAL HIGH (ref 0.0–2.0)
Bicarbonate: 31.9 mmol/L — ABNORMAL HIGH (ref 20.0–28.0)
Calcium, Ion: 1.1 mmol/L — ABNORMAL LOW (ref 1.15–1.40)
HCT: 28 % — ABNORMAL LOW (ref 36.0–46.0)
Hemoglobin: 9.5 g/dL — ABNORMAL LOW (ref 12.0–15.0)
O2 Saturation: 92 %
Potassium: 4.7 mmol/L (ref 3.5–5.1)
Sodium: 141 mmol/L (ref 135–145)
TCO2: 34 mmol/L — ABNORMAL HIGH (ref 22–32)
pCO2, Ven: 56.1 mmHg (ref 44.0–60.0)
pH, Ven: 7.362 (ref 7.250–7.430)
pO2, Ven: 68 mmHg — ABNORMAL HIGH (ref 32.0–45.0)

## 2021-05-03 LAB — BRAIN NATRIURETIC PEPTIDE: B Natriuretic Peptide: 358.4 pg/mL — ABNORMAL HIGH (ref 0.0–100.0)

## 2021-05-03 LAB — CBC WITH DIFFERENTIAL/PLATELET
Abs Immature Granulocytes: 0.03 10*3/uL (ref 0.00–0.07)
Basophils Absolute: 0 10*3/uL (ref 0.0–0.1)
Basophils Relative: 1 %
Eosinophils Absolute: 0.1 10*3/uL (ref 0.0–0.5)
Eosinophils Relative: 2 %
HCT: 34.7 % — ABNORMAL LOW (ref 36.0–46.0)
Hemoglobin: 10.2 g/dL — ABNORMAL LOW (ref 12.0–15.0)
Immature Granulocytes: 1 %
Lymphocytes Relative: 18 %
Lymphs Abs: 0.8 10*3/uL (ref 0.7–4.0)
MCH: 30.4 pg (ref 26.0–34.0)
MCHC: 29.4 g/dL — ABNORMAL LOW (ref 30.0–36.0)
MCV: 103.3 fL — ABNORMAL HIGH (ref 80.0–100.0)
Monocytes Absolute: 0.5 10*3/uL (ref 0.1–1.0)
Monocytes Relative: 12 %
Neutro Abs: 3.2 10*3/uL (ref 1.7–7.7)
Neutrophils Relative %: 66 %
Platelets: 136 10*3/uL — ABNORMAL LOW (ref 150–400)
RBC: 3.36 MIL/uL — ABNORMAL LOW (ref 3.87–5.11)
RDW: 18 % — ABNORMAL HIGH (ref 11.5–15.5)
WBC: 4.7 10*3/uL (ref 4.0–10.5)
nRBC: 0 % (ref 0.0–0.2)

## 2021-05-03 LAB — HEPATIC FUNCTION PANEL
ALT: 7 U/L (ref 0–44)
AST: 18 U/L (ref 15–41)
Albumin: 2.6 g/dL — ABNORMAL LOW (ref 3.5–5.0)
Alkaline Phosphatase: 48 U/L (ref 38–126)
Bilirubin, Direct: 0.1 mg/dL (ref 0.0–0.2)
Indirect Bilirubin: 0.4 mg/dL (ref 0.3–0.9)
Total Bilirubin: 0.5 mg/dL (ref 0.3–1.2)
Total Protein: 6.4 g/dL — ABNORMAL LOW (ref 6.5–8.1)

## 2021-05-03 LAB — BASIC METABOLIC PANEL
Anion gap: 4 — ABNORMAL LOW (ref 5–15)
BUN: 24 mg/dL — ABNORMAL HIGH (ref 8–23)
CO2: 35 mmol/L — ABNORMAL HIGH (ref 22–32)
Calcium: 8.5 mg/dL — ABNORMAL LOW (ref 8.9–10.3)
Chloride: 102 mmol/L (ref 98–111)
Creatinine, Ser: 1.51 mg/dL — ABNORMAL HIGH (ref 0.44–1.00)
GFR, Estimated: 36 mL/min — ABNORMAL LOW (ref >60)
Glucose, Bld: 94 mg/dL (ref 70–99)
Potassium: 4.7 mmol/L (ref 3.5–5.1)
Sodium: 141 mmol/L (ref 135–145)

## 2021-05-03 LAB — GROUP A STREP BY PCR: Group A Strep by PCR: NOT DETECTED

## 2021-05-03 LAB — RESP PANEL BY RT-PCR (FLU A&B, COVID) ARPGX2
Influenza A by PCR: NEGATIVE
Influenza B by PCR: NEGATIVE
SARS Coronavirus 2 by RT PCR: NEGATIVE

## 2021-05-03 LAB — TSH: TSH: 8.955 u[IU]/mL — ABNORMAL HIGH (ref 0.350–4.500)

## 2021-05-03 MED ORDER — BUDESONIDE 0.5 MG/2ML IN SUSP
2.0000 mg | Freq: Four times a day (QID) | RESPIRATORY_TRACT | Status: DC
  Filled 2021-05-03 (×2): qty 8

## 2021-05-03 MED ORDER — NYSTATIN 100000 UNIT/ML MT SUSP
5.0000 mL | Freq: Four times a day (QID) | OROMUCOSAL | Status: DC
  Administered 2021-05-03 – 2021-05-08 (×20): 500000 [IU] via ORAL
  Filled 2021-05-03 (×22): qty 5

## 2021-05-03 MED ORDER — IPRATROPIUM-ALBUTEROL 0.5-2.5 (3) MG/3ML IN SOLN
3.0000 mL | Freq: Four times a day (QID) | RESPIRATORY_TRACT | Status: DC
  Administered 2021-05-03 (×2): 3 mL via RESPIRATORY_TRACT
  Filled 2021-05-03 (×2): qty 3

## 2021-05-03 MED ORDER — IPRATROPIUM-ALBUTEROL 0.5-2.5 (3) MG/3ML IN SOLN
3.0000 mL | Freq: Three times a day (TID) | RESPIRATORY_TRACT | Status: DC
  Administered 2021-05-04 – 2021-05-08 (×14): 3 mL via RESPIRATORY_TRACT
  Filled 2021-05-03 (×14): qty 3

## 2021-05-03 MED ORDER — GUAIFENESIN ER 600 MG PO TB12
1200.0000 mg | ORAL_TABLET | Freq: Two times a day (BID) | ORAL | Status: DC
  Administered 2021-05-03 – 2021-05-07 (×10): 1200 mg via ORAL
  Filled 2021-05-03 (×11): qty 2

## 2021-05-03 MED ORDER — LEVOTHYROXINE SODIUM 75 MCG PO TABS: 75.0000 ug | ORAL_TABLET | Freq: Every day | ORAL | Status: DC

## 2021-05-03 MED ORDER — LIDOCAINE VISCOUS HCL 2 % MT SOLN
15.0000 mL | Freq: Once | OROMUCOSAL | Status: AC
  Administered 2021-05-03: 15 mL via OROMUCOSAL
  Filled 2021-05-03: qty 15

## 2021-05-03 MED ORDER — HEPARIN SODIUM (PORCINE) 5000 UNIT/ML IJ SOLN
5000.0000 [IU] | Freq: Two times a day (BID) | INTRAMUSCULAR | Status: DC
  Administered 2021-05-03 – 2021-05-08 (×11): 5000 [IU] via SUBCUTANEOUS
  Filled 2021-05-03 (×11): qty 1

## 2021-05-03 MED ORDER — FUROSEMIDE 10 MG/ML IJ SOLN
60.0000 mg | Freq: Once | INTRAMUSCULAR | Status: AC
  Administered 2021-05-03: 60 mg via INTRAVENOUS
  Filled 2021-05-03: qty 6

## 2021-05-03 MED ORDER — FERROUS SULFATE 325 (65 FE) MG PO TABS
325.0000 mg | ORAL_TABLET | Freq: Two times a day (BID) | ORAL | Status: DC
  Administered 2021-05-03 – 2021-05-08 (×10): 325 mg via ORAL
  Filled 2021-05-03 (×10): qty 1

## 2021-05-03 MED ORDER — TADALAFIL 20 MG PO TABS
40.0000 mg | ORAL_TABLET | Freq: Every day | ORAL | Status: DC
  Administered 2021-05-04 – 2021-05-08 (×5): 40 mg via ORAL
  Filled 2021-05-03 (×5): qty 2

## 2021-05-03 MED ORDER — SPIRONOLACTONE 12.5 MG HALF TABLET
12.5000 mg | ORAL_TABLET | Freq: Every day | ORAL | Status: DC
  Administered 2021-05-05: 12.5 mg via ORAL
  Filled 2021-05-03 (×2): qty 1

## 2021-05-03 MED ORDER — PREDNISONE 20 MG PO TABS
40.0000 mg | ORAL_TABLET | Freq: Every day | ORAL | Status: DC
  Administered 2021-05-04 – 2021-05-08 (×5): 40 mg via ORAL
  Filled 2021-05-03 (×5): qty 2

## 2021-05-03 MED ORDER — PANTOPRAZOLE SODIUM 40 MG PO TBEC
40.0000 mg | DELAYED_RELEASE_TABLET | Freq: Every day | ORAL | Status: DC
  Administered 2021-05-04 – 2021-05-08 (×5): 40 mg via ORAL
  Filled 2021-05-03 (×6): qty 1

## 2021-05-03 MED ORDER — AMBRISENTAN 5 MG PO TABS
10.0000 mg | ORAL_TABLET | Freq: Every day | ORAL | Status: DC
  Administered 2021-05-04 – 2021-05-08 (×5): 10 mg via ORAL
  Filled 2021-05-03 (×6): qty 2

## 2021-05-03 MED ORDER — KETOCONAZOLE 2 % EX CREA
1.0000 "application " | TOPICAL_CREAM | Freq: Every day | CUTANEOUS | Status: DC
  Administered 2021-05-04 – 2021-05-07 (×3): 1 via TOPICAL
  Filled 2021-05-03: qty 15

## 2021-05-03 MED ORDER — AMIODARONE HCL 100 MG PO TABS
100.0000 mg | ORAL_TABLET | Freq: Every day | ORAL | Status: DC
  Administered 2021-05-04 – 2021-05-08 (×5): 100 mg via ORAL
  Filled 2021-05-03 (×5): qty 1

## 2021-05-03 MED ORDER — FUROSEMIDE 10 MG/ML IJ SOLN
20.0000 mg | Freq: Two times a day (BID) | INTRAMUSCULAR | Status: DC
  Filled 2021-05-03: qty 2

## 2021-05-03 MED ORDER — MAGNESIUM SULFATE 2 GM/50ML IV SOLN
2.0000 g | Freq: Once | INTRAVENOUS | Status: AC
  Administered 2021-05-03: 2 g via INTRAVENOUS
  Filled 2021-05-03: qty 50

## 2021-05-03 MED ORDER — FLUTICASONE FUROATE-VILANTEROL 100-25 MCG/INH IN AEPB
1.0000 | INHALATION_SPRAY | Freq: Every day | RESPIRATORY_TRACT | Status: DC
  Administered 2021-05-05 – 2021-05-08 (×4): 1 via RESPIRATORY_TRACT
  Filled 2021-05-03: qty 28

## 2021-05-03 MED ORDER — SODIUM CHLORIDE 0.9 % IV BOLUS
500.0000 mL | Freq: Once | INTRAVENOUS | Status: AC
  Administered 2021-05-03: 500 mL via INTRAVENOUS

## 2021-05-03 MED ORDER — ACETAMINOPHEN 325 MG PO TABS
325.0000 mg | ORAL_TABLET | Freq: Every evening | ORAL | Status: DC | PRN
  Administered 2021-05-04: 325 mg via ORAL
  Filled 2021-05-03: qty 1

## 2021-05-03 MED ORDER — IPRATROPIUM BROMIDE 0.02 % IN SOLN
0.5000 mg | Freq: Once | RESPIRATORY_TRACT | Status: AC
  Administered 2021-05-03: 0.5 mg via RESPIRATORY_TRACT
  Filled 2021-05-03: qty 2.5

## 2021-05-03 MED ORDER — METHYLPREDNISOLONE SODIUM SUCC 125 MG IJ SOLR
125.0000 mg | Freq: Once | INTRAMUSCULAR | Status: AC
  Administered 2021-05-03: 125 mg via INTRAVENOUS
  Filled 2021-05-03: qty 2

## 2021-05-03 MED ORDER — BUDESONIDE 0.5 MG/2ML IN SUSP
0.5000 mg | Freq: Two times a day (BID) | RESPIRATORY_TRACT | Status: DC
  Administered 2021-05-03 – 2021-05-08 (×10): 0.5 mg via RESPIRATORY_TRACT
  Filled 2021-05-03 (×10): qty 2

## 2021-05-03 MED ORDER — AMBRISENTAN 5 MG PO TABS: 10.0000 mg | ORAL_TABLET | Freq: Every day | ORAL | Status: DC

## 2021-05-03 MED ORDER — ASPIRIN EC 81 MG PO TBEC
81.0000 mg | DELAYED_RELEASE_TABLET | Freq: Every day | ORAL | Status: DC
  Administered 2021-05-03 – 2021-05-08 (×6): 81 mg via ORAL
  Filled 2021-05-03 (×6): qty 1

## 2021-05-03 MED ORDER — ALBUTEROL SULFATE (2.5 MG/3ML) 0.083% IN NEBU: 3.0000 mL | INHALATION_SOLUTION | Freq: Four times a day (QID) | RESPIRATORY_TRACT | Status: DC | PRN

## 2021-05-03 MED ORDER — ALBUTEROL SULFATE (2.5 MG/3ML) 0.083% IN NEBU
2.5000 mg | INHALATION_SOLUTION | Freq: Once | RESPIRATORY_TRACT | Status: AC
  Administered 2021-05-03: 2.5 mg via RESPIRATORY_TRACT
  Filled 2021-05-03: qty 3

## 2021-05-03 MED ORDER — POTASSIUM CHLORIDE CRYS ER 20 MEQ PO TBCR
20.0000 meq | EXTENDED_RELEASE_TABLET | Freq: Every day | ORAL | Status: DC
  Administered 2021-05-04 – 2021-05-05 (×2): 20 meq via ORAL
  Filled 2021-05-03 (×2): qty 1

## 2021-05-03 MED ORDER — CICLOPIROX 8 % EX SOLN: Freq: Every day | CUTANEOUS | Status: DC

## 2021-05-03 NOTE — Progress Notes (Signed)
VBG showed no CO2 retention.  CT chest showed no significant pulm congestion or pleural effusions.

## 2021-05-03 NOTE — Telephone Encounter (Signed)
Patient's husband called stating patient is been taking by EMS to the hospital now. B/P was 79/30. Appointment for today was cancelled.  FYI

## 2021-05-03 NOTE — Consult Note (Addendum)
Advanced Heart Failure Team Consult Note   Primary Physician: [name], MD PCP-Cardiologist:  [name], MD  Reason for Consultation: A/C diastolic HF  HPI:    Linda Stevens is seen today for evaluation of acute on chronic diastolic HF at the request of Dr. Roosevelt Locks with [facility].  This is a 75 year old female with h/o chronic diastolic HF, PAH, HTN, COPD (former smoker quit 2015), chronic respiratory failure on chronic 02 at 4L, recurrent right pleural effusion, cirrhosis, seasonal allergies, asthma.   Admitted 1/17 with progressive DOE, hypoxia, and lower ext edema and found to have severe PAH.  R/LHC 10/08/15 with normal coronaries, normal CO, and severe PAH PAP 104/49 with 13.6 WU.  PAH well out of proportion to left-sided filling pressures and COPD. Started revatio 20 TID with consideration for macitentan as outpatient. PFTs 10/05/15  FEV1 0.91 (40%) FVC 1.64 (55%), DLCO 8.80 (38%). Auto-immune and infectious serologies negative. VQ negative.    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 Has been drained twice. Underwent first thoracentesis on 11/7.  Transudative. CT scan 07/24/17 showed large R effusion and small pericardial effusion. +COPD.  Underwent repeat thoracentesis on 07/29/17 with 310cc out.  Cytology negative. F/u CXR 11/21 showed small bilateral effusions.   RF markedly ++ but other serology negative. Saw Dr. Amil Amen who said she did not have RA but was diagnosed with polyarthralgia/OA.    Admitted 6/21 with respiratory failure. Echo EF 55-60% with mild RV HK with large peridardial effusion. Suspicion for auto-immune flare. ESR 63. Auto-immune panel again negative. Treated with steroids with prompt improvement and regression of pericardial effusion. Had PAF Went to SNF.   Readmitted 9/21 with Hgb  3.1. Underwent EGD and colonoscopy , concern for esophageal candidiasis, multiple polyps removed from the colon and  colonic AVMs noted which is suspected to be the cause of ongoing bleeding in the setting of Eliquis. Eliquis stopped. Transfused a total of 4 units of PRBC and given IV iron.  Patient presented to the ED via EMS around 10 am with complaints of worsening dyspnea and cough X 1 week. Was actively wheezing. No weight gain or leg edema reported.  Has longstanding orthopnea. No fevers or chills. Denies nausea, vomiting, diarrhea. She was hypoxic with 02 sats 86% on 4L, increased to 6L. Hypotensive with BP 80/40, improved somewhat after 500 mL fluid bolus. Negative for COVID-19. No leukocytosis, Scr 1.51 (baseline 1.2-13.), C02 35, K and Na okay, Hgb 10.2 (baseline 9s-10s). She was given duonebs and IV solumedrol. Chest x-ray with bibasilar opacities and interstitial prominence suggestive of atelectasis versus less likely consolidation, small bilateral pleural effusions. Patient admitted to hospitalist service for a/c respiratory failure with hypoxia suspected to be due to combination of ? COPD exacerbation and a/c CHF. Initiated on 20 mg lasix IV BID.  02 requirement improved to 4L at time of evaluation. Notes improvement in dyspnea with nebulizers.   Echo pending  Review of Systems: [y] = yes, _0  = no   General: Weight gain _1 ; Weight loss _2 ; Anorexia _3 ; Fatigue [Y]; Fever _4 ; Chills _5 ; Weakness [Y]  Cardiac: Chest pain/pressure _6 ; Resting SOB [Y]; Exertional SOB _7 ; Orthopnea [Y]; Pedal Edema _8 ; Palpitations _9 ; Syncope _10 ; Presyncope _11 ; Paroxysmal nocturnal dyspnea_12   Pulmonary: Cough [Y]; Wheezing[Y]; Hemoptysis_13 ; Sputum [Y]; Snoring _14   GI: Vomiting_15 ; Dysphagia_16 ; Melena_17 ;  Hematochezia _0 ; Heartburn_1 ; Abdominal pain _2 ; Constipation _3 ; Diarrhea _4 ; BRBPR _5   GU: Hematuria_6 ; Dysuria _7 ; Nocturia_8   Vascular: Pain in legs with walking _9 ; Pain in feet with lying flat _10 ; Non-healing sores _11 ; Stroke _12 ; TIA _13 ; Slurred speech _14 ;  Neuro: Headaches_15 ;  Vertigo_16 ; Seizures_17 ; Paresthesias_18 ;Blurred vision _19 ; Diplopia _20 ; Vision changes _21   Ortho/Skin: Arthritis _22 ; Joint pain _23 ; Muscle pain _24 ; Joint swelling _25 ; Back Pain _26 ; Rash _27   Psych: Depression_28 ; Anxiety_29   Heme: Bleeding problems _30 ; Clotting disorders _31 ; Anemia _32   Endocrine: Diabetes _33 ; Thyroid dysfunction[Y]  Home Medications Prior to Admission medications   Medication Sig Start Date End Date Taking? Authorizing Provider  acetaminophen (TYLENOL) 325 MG tablet Take 325 mg by mouth at bedtime as needed for moderate pain or headache.     [provider]  albuterol (VENTOLIN HFA) 108 (90 Base) MCG/ACT inhaler INHALE 2 PUFFS INTO THE LUNGS EVERY 6 HOURS AS NEEDED FOR WHEEZING/SHORTNESS OF BREATH Patient taking differently: Inhale 2 puffs into the lungs every 6 (six) hours as needed for wheezing or shortness of breath. 07/12/20   Bedsole, Amy E, MD  ambrisentan (LETAIRIS) 10 MG tablet Take 1 tablet (10 mg total) by mouth daily. Please schedule an appointment for further refills 04/10/21   Bensimhon, Shaune Pascal, MD  amiodarone (PACERONE) 100 MG tablet TAKE 1 TABLET BY MOUTH EVERY DAY Patient taking differently: Take 100 mg by mouth daily. 09/27/20   Bensimhon, Shaune Pascal, MD  aspirin EC 81 MG EC tablet Take 1 tablet (81 mg total) by mouth daily. 10/09/15   Debbe Odea, MD  ciclopirox (PENLAC) 8 % solution Apply topically at bedtime. Apply over nail and surrounding skin. Apply daily over previous coat. After seven (7) days, may remove with alcohol and continue cycle. 12/14/20   Bedsole, Amy E, MD  ferrous sulfate 325 (65 FE) MG EC tablet TAKE 1 TABLET BY MOUTH 2 TIMES DAILY WITH A MEAL. Patient taking differently: Take 325 mg by mouth 2 (two) times daily with a meal. 12/28/20   Bedsole, Amy E, MD  furosemide (LASIX) 40 MG tablet TAKE 1 TABLET BY MOUTH TWICE A DAY Patient taking differently: Take 40 mg by mouth 2 (two) times daily. 12/21/20   Bensimhon, Shaune Pascal, MD   ketoconazole (NIZORAL) 2 % cream Apply 1 application topically daily. 12/14/20   Bedsole, Amy E, MD  levothyroxine (SYNTHROID) 75 MCG tablet Take 75 mcg by mouth daily. 04/19/21   [provider]  OXYGEN Inhale into the lungs. Using 4 liters    [provider]  pantoprazole (PROTONIX) 40 MG tablet TAKE 1 TABLET BY MOUTH EVERY DAY 01/21/21   Bedsole, Amy E, MD  potassium chloride SA (KLOR-CON M20) 20 MEQ tablet TAKE 1 TABLET BY MOUTH EVERY DAY Patient taking differently: Take 20 mEq by mouth daily. 02/26/21   Bedsole, Amy E, MD  spironolactone (ALDACTONE) 25 MG tablet TAKE 1/2 TABLET BY MOUTH EVERY DAY Patient taking differently: Take 12.5 mg by mouth daily. 12/24/20   Bensimhon, Shaune Pascal, MD  tadalafil (CIALIS) 20 MG tablet Take 2 tablets (40 mg total) by mouth daily. 03/19/21   Bensimhon, Shaune Pascal, MD    Past Medical History: Past Medical History:  Diagnosis Date   Acute respiratory failure with hypoxia and hypercapnea 02/26/2014   Allergic  rhinitis due to pollen    Allergy    Anemia 06/12/2020   Arthritis    Asthma    CHF (congestive heart failure) (HCC)    COPD (chronic obstructive pulmonary disease) (HCC)    Diastolic dysfunction    a. 02/2014 Echo: EF 65-70%, Gr 1 DD, RVH;  b. 09/2015 Echo: EF 55%, Gr 1 DD.   Emphysema of lung (HCC)    H/O seasonal allergies    History of chicken pox    History of tobacco abuse    a. Quit 2015.   History of UTI    Hypertension    Hypertensive heart disease    Lower extremity edema    Pericardial effusion    a. 09/2015 Echo: Mod eff w/o tamponade.   Right heart failure with reduced right ventricular function ([place])    a. 09/2015 Echo: EF 55%, Gr 1 DD, D shaped IV septum, sev dil RV with mod reduced fxn, mod dil RA, RV-RA grad 77mHg, PASP 850mg, mod pericard eff w/o tamponade.   Severe Pulmonary Hypertension    a. 09/2015 Echo: PASP 879m.    Past Surgical History: Past Surgical History:  Procedure Laterality Date   CARDIAC  CATHETERIZATION Stevens/A 10/08/2015   Procedure: Right/Left Heart Cath and Coronary Angiography;  Surgeon: DanJolaine ArtistD;  Location: [facility];  Service: Cardiovascular;  Laterality: Stevens/A;   CHOLECYSTECTOMY     Open   COLONOSCOPY WITH PROPOFOL Stevens/A 06/07/2020   Procedure: COLONOSCOPY WITH PROPOFOL;  Surgeon: PyrJerene BearsD;  [facility];  Laterality: Stevens/A;   ESOPHAGOGASTRODUODENOSCOPY (EGD) WITH PROPOFOL Stevens/A 06/07/2020   Procedure: ESOPHAGOGASTRODUODENOSCOPY (EGD) WITH PROPOFOL;  Surgeon: PyrJerene BearsD;  [facility];  Service: Gastroenterology;  Laterality: Stevens/A;   POLYPECTOMY  06/07/2020   Procedure: POLYPECTOMY;  Surgeon: PyrJerene BearsD;  [facility];;   RIGHT HEART CATH Stevens/A 01/26/2017   Procedure: Right Heart Cath;  Surgeon: BenJolaine ArtistD;  Location: [facility];  Service: Cardiovascular;  Laterality: Stevens/A;   RIGHT HEART CATH Stevens/A 08/12/2017   Procedure: RIGHT HEART CATH;  Surgeon: BenJolaine ArtistD;  Location: [facility];  Service: Cardiovascular;  Laterality: Stevens/A;    Family History: Family History  Problem Relation Age of Onset   Glaucoma Brother    Vascular Disease Father        Died of complications from surgery   Heart attack Father    Asthma Mother        Died of asthma attack   Dementia Mother    Hyperlipidemia Mother    Hypertension Mother     Social History: Social History   Socioeconomic History   Marital status: Married    Spouse name: Not on file   Number of children: Not on file   Years of education: 2   Highest education level: Not on file  Occupational History   Occupation: retired teaPharmacist, hospitalobacco Use   Smoking status: Former    Packs/day: 1.00    Years: 40.00    Pack years: 40.00    Types: Cigarettes    Quit date: 02/22/2014    Years since quitting: 7.1   Smokeless tobacco: Never  Vaping Use   Vaping Use: Never used   Substance and Sexual Activity   Alcohol use: No   Drug use: No   Sexual activity: Never  Other Topics Concern   Not on file  Social History Narrative   Married with 2 children.  Independent of ADLs.      Does not have a living will.   Would desire CPR but would not want prolonged life support if futile- husband aware.   Social Determinants of Health   Financial Resource Strain: Not on file  Food Insecurity: Not on file  Transportation Needs: Not on file  Physical Activity: Not on file  Stress: Not on file  Social Connections: Not on file    Allergies:  Allergies  Allergen Reactions   Amoxicillin-Pot Clavulanate     REACTION: gi upset Has patient had a PCN reaction causing immediate rash, facial/tongue/throat swelling, SOB or lightheadedness with hypotension: No Has patient had a PCN reaction causing severe rash involving mucus membranes or skin necrosis: No Has patient had a PCN reaction that required hospitalization : No Has patient had a PCN reaction occurring within the last 10 years: No If all of the above answers are "NO", then may proceed with Cephalosporin use.    Cefdinir     REACTION: gi  upset   Moxifloxacin     REACTION: rash   Singulair [Montelukast Sodium]     Itching     Objective:    Vital Signs:   Temp:  [98.4 F (36.9 C)] 98.4 F (36.9 C) (08/26 0959) Pulse Rate:  [77-90] 84 (08/26 1330) Resp:  [15-24] 21 (08/26 1330) BP: (99-116)/(42-53) 104/44 (08/26 1330) SpO2:  [92 %-100 %] 95 % (08/26 1330) Weight:  [56.7 kg] 56.7 kg (08/26 1001)    Weight change: Filed Weights   05/03/21 1001  Weight: 56.7 kg    Intake/Output:  No intake or output data in the 24 hours ending 05/03/21 1408    Physical Exam    General:  Thin, elderly female. In respiratory distress with use of accessory muscles to breathe. On 4L 02 [place] HEENT: normal Neck: supple. +JVD. Carotids 2+ bilat; no bruits. No lymphadenopathy or thyromegaly appreciated. Cor: PMI  nondisplaced. Regular rate & rhythm. + systolic murmur Lungs: Diminished in bases, expiratory wheezes Abdomen: soft, nontender, nondistended. No hepatosplenomegaly. No bruits or masses. Good bowel sounds. Extremities: no cyanosis, clubbing, rash, edema Neuro: alert & orientedx3, cranial nerves grossly intact. moves all 4 extremities w/o difficulty. Affect pleasant   Telemetry   NSR 80s-90s  EKG    NSR 1st degree AV block, 86 bpm  Labs   Basic Metabolic Panel: Recent Labs  Lab 05/03/21 1008  NA 141  K 4.7  CL 102  CO2 35*  GLUCOSE 94  BUN 24*  CREATININE 1.51*  CALCIUM 8.5*    Liver Function Tests: No results for input(s): AST, ALT, ALKPHOS, BILITOT, PROT, ALBUMIN in the last 168 hours. No results for input(s): LIPASE, AMYLASE in the last 168 hours. No results for input(s): AMMONIA in the last 168 hours.  CBC: Recent Labs  Lab 05/03/21 1008  WBC 4.7  NEUTROABS 3.2  HGB 10.2*  HCT 34.7*  MCV 103.3*  PLT 136*    Cardiac Enzymes: No results for input(s): CKTOTAL, CKMB, CKMBINDEX, TROPONINI in the last 168 hours.  BNP: BNP (last 3 results) Recent Labs    07/02/20 1441 11/02/20 1413  BNP 344.5* 448.2*    ProBNP (last 3 results) No results for input(s): PROBNP in the last 8760 hours.   CBG: No results for input(s): GLUCAP in the last 168 hours.  Coagulation Studies: No results for input(s): LABPROT, INR in the last 72 hours.   Imaging  DG Chest Port 1 View  Result Date: 05/03/2021 CLINICAL DATA:  75 year old female with history of worsening shortness of breath over the past week. Low blood pressure. EXAM: PORTABLE CHEST 1 VIEW COMPARISON:  Chest x-ray 06/05/2020. FINDINGS: Extensive ill-defined opacities and areas of interstitial prominence throughout the mid to lower lungs bilaterally, most severe in the lung bases. Small bilateral pleural effusions (right greater than left). Mild cephalization of the pulmonary vasculature. No pneumothorax. Mild  cardiomegaly. Upper mediastinal contours are within normal limits. Atherosclerotic calcifications in the thoracic aorta. IMPRESSION: 1. The appearance of the chest suggests congestive heart failure, as above. 2. Bibasilar opacities likely reflect areas of subsegmental atelectasis, however, underlying airspace consolidation is difficult to entirely exclude. 3. Aortic atherosclerosis. Electronically Signed   By: Vinnie Langton M.D.   On: 05/03/2021 10:50     Medications:     Current Medications:  ambrisentan  10 mg Oral Daily   amiodarone  100 mg Oral Daily   aspirin  81 mg Oral Daily   budesonide (PULMICORT) nebulizer solution  2 mg Nebulization Q6H   ciclopirox   Topical QHS   ferrous sulfate  325 mg Oral BID WC   fluticasone furoate-vilanterol  1 puff Inhalation Daily   furosemide  20 mg Intravenous BID   guaiFENesin  1,200 mg Oral BID   heparin  5,000 Units Subcutaneous Q12H   ipratropium-albuterol  3 mL Nebulization Q6H   ketoconazole  1 application Topical Daily   levothyroxine  75 mcg Oral Daily   lidocaine  15 mL Mouth/Throat Once   nystatin  5 mL Oral QID   pantoprazole  40 mg Oral Daily   potassium chloride SA  20 mEq Oral Daily   [START ON 05/04/2021] predniSONE  40 mg Oral Q breakfast   spironolactone  12.5 mg Oral Daily   tadalafil  40 mg Oral Daily    Infusions:    Assessment/Plan   Chronic diastolic HF with RV failure: - Repeat RHC 12/18 with mild PAH (mean PA 33) - Echo 5/18 with complete resolution of RV strain. No significant TR to estimate RVSP - Echo 4/19 with EF 65% no evidence of RV strain  - echo  12/09/19 EF 70-75% RV normal  - Echo 11/21 EF 75% RV normal no evidence PAH - Resolution of PAH on recent echos - Auto-immune panel previously negative. Suspicion for CTD. - Admitted with worsening dyspnea and hypoxia. Small pleural effusions noted on chest x-ray. Weight actually down 7 lb from last visit in February. Mild fluid overload. ReDS 18%. BNP  ordered. - Will give 60 mg lasix IV - Repeat echo pending  PAH: - suspect combination of WHO Group I and III PAH - On Letairis and Tadalafil. Previously discussed adding Selexipag but pressures down on last cath/echo  3.  A/C respiratory failure with hypoxia: - On 4L 02 at home, requiring 6L on initial presentation. Down to 4L at time of evaluation. - Notes some improvement with nebulizers - Suspect primarily d/t COPD exacerbation. Treatment per [facility]. Some component of a/c CHF  4. Recurrent R pleural effusion - Given improvement in PA pressures doubt effusion related to severe PAH/ Previous ab CT with evidence of cirrhosis.   -  Fluid analysis = transudative.  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 Now controlled with with diuresis - Small bilateral effusions noted on CXR this admit  5.  Pericardial effusion: - Given improvement in PA pressures doubt effusion related to severe PAH/ Previous ab CT  with evidence of cirrhosis.   -  Autoimmune panel previously negative. Regression with steroids.  -  Fluid analysis = transudative.  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 Now controlled with with diuresis - No significant recurrence  6.  PAF: - NSR today.  - On amio 100 mg daily. Dr. Vaughan Browner planning to D/C at next clinic f/u. - Eliquis stopped due to severe LGIB 9/21 (hgb 3.1). Will not restart with AVMs  7. Cirrhosis on CT - due to RHF. Hepatitis serologies are negative. Possibly due to right heart failure - given EF > 75% may need to consider propanolol +/- midodrine to avoid high-output HF at some point  8. Hypothyroidism: - Has not been taking Synthroid at home - [facility] checking TSH  9. Hypotension: - BP improved. Continue to monitor. - Given IV fluids in ED   Length of Stay: 0  Linda Stevens, Linda Stevens, Linda Stevens  05/03/2021, 2:08 PM  Advanced Heart Failure Team Pager [phone] (M-F; 7a - 5p)  Please contact [facility] for night-coverage after hours  (4p -7a ) and weekends on [email or web address]   Patient seen with PA, agree with the above note.    Patient was admitted with acute on chronic hypoxemic respiratory failure.  REDS clip 18.  CT chest with scattered heterogeneous airspace opacities throughout the lungs, most concentrated in LUL (new, ?multifocal infection).  BNP 358 (this is stable for her).   General: NAD Neck: JVP 10 cm, no thyromegaly or thyroid nodule.  Lungs: Diffuse bilateral wheezes.  CV: Nondisplaced PMI.  Heart regular S1/S2, no S3/S4, no murmur.  No peripheral edema.   Abdomen: Soft, nontender, no hepatosplenomegaly, no distention.  Skin: Intact without lesions or rashes.  Neurologic: Alert and oriented x 3.  Psych: Normal affect. Extremities: No clubbing or cyanosis.  HEENT: Normal.   Findings most consistent with COPD exacerbation, possibly triggered by viral infection based on appearance of CT chest.  She has mild RV failure on exam, which is likely chronic to a certain degree.  JVP elevated on exam, BNP not markedly elevated and consistent with her baseline, REDS clip normal at 18%.  - Would treat for COPD exacerbation with nebs and steroids per [facility], would consider short course of antibiotics.  - I will give her a dose of Lasix 60 mg IV x 1 now, reassess in am to determine further diuretic requirements.  - Continue her PH medications.  - Pleural effusions on CT are small and loculated, no change from prior.   History of PAF, she is in NSR currently.  Not on anticoagulation due to frequent GI bleeds.  She is on amiodarone, consider discontinuation given pulmonary abnormalities.   Loralie Champagne 05/03/2021 5:24 PM

## 2021-05-03 NOTE — ED Provider Notes (Signed)
[place] EMERGENCY DEPARTMENT Provider Note   CSN: [id number] Arrival date & time: 05/03/21  5929     History Chief Complaint  Patient presents with   Shortness of Breath    Linda Stevens is a 75 y.o. female who presents emergency department with a chief complaint of shortness of breath.  He has a past medical history of COPD pulmonary hypertension, CHF, with chronic respiratory failure on 4 L at baseline.  She states that over the past week she has had progressively worsening shortness of breath which feels like previous COPD exacerbations.  She has been using her albuterol inhaler more frequently but has only been getting intermittent relief with worsening of her breathing again.  She called EMS today due to her work of breathing.  EMS reports they found her hypotensive with oxygen saturation of 86% on her normal 4 L.  She is placed on 6 L.  She was initially hypotensive with a blood pressure of 80/40 given 100 mL of normal saline with improvement to 104/70.  She complains of several days of very sore throat and has had decreased p.o. intake.  She denies fevers or chills or cough outside of her normal cough.  She denies any peripheral edema or weight gain.  She weighs daily.   Shortness of Breath     Past Medical History:  Diagnosis Date   Acute respiratory failure with hypoxia and hypercapnea 02/26/2014   Allergic rhinitis due to pollen    Allergy    Anemia 06/12/2020   Arthritis    Asthma    CHF (congestive heart failure) (HCC)    COPD (chronic obstructive pulmonary disease) (HCC)    Diastolic dysfunction    a. 02/2014 Echo: EF 65-70%, Gr 1 DD, RVH;  b. 09/2015 Echo: EF 55%, Gr 1 DD.   Emphysema of lung (HCC)    H/O seasonal allergies    History of chicken pox    History of tobacco abuse    a. Quit 2015.   History of UTI    Hypertension    Hypertensive heart disease    Lower extremity edema    Pericardial effusion    a. 09/2015 Echo: Mod eff w/o  tamponade.   Right heart failure with reduced right ventricular function ([place])    a. 09/2015 Echo: EF 55%, Gr 1 DD, D shaped IV septum, sev dil RV with mod reduced fxn, mod dil RA, RV-RA grad 61mHg, PASP 816mg, mod pericard eff w/o tamponade.   Severe Pulmonary Hypertension    a. 09/2015 Echo: PASP 8771m.    Patient Active Problem List   Diagnosis Date Noted   Onychomycosis 01/16/2021   Tinea pedis of both feet 01/16/2021   Skin rash 01/16/2021   Hypothyroid 11/23/2020   PAF (paroxysmal atrial fibrillation) ([place]CC[place]ot[place]0/08/2020   Heme positive stool    Benign neoplasm of transverse colon    Benign neoplasm of rectum    Symptomatic anemia 06/05/2020   GI bleed secondary to colonic AVMs 06/05/2020   Acute blood loss anemia 06/05/2020   AKI (acute kidney injury) ([place]CC[place]junt[place]9/28/2021   DNR (do not resuscitate)    Palliative care by specialist    Community acquired pneumonia 02/19/2020   Atrial fibrillation with RVR ([place]CC[place]  Chronic respiratory failure with hypoxia and hypercapnia (HCC[place]0/31/2018   Pleural effusion, bilateral 07/08/2017   Chronic respiratory failure with hypoxia (HCCFalcon Mesa2/03/2016   Hepatic cirrhosis (HCCBarneveld2/02/2016   Pulmonary artery hypertension ([place]CC[place]  Chronic diastolic CHF (congestive heart failure) (HCC)    Acute on chronic respiratory failure with hypoxia (HCC)    COPD GOLD II criteria but 02 dep 24/7     Severe Pulmonary Hypertension    Pericardial effusion    Diastolic dysfunction    Goals of care, counseling/discussion 16/55/3748   Mild diastolic dysfunction 27/03/8674   Acute respiratory failure with hypoxia and hypercapnea 02/26/2014   Hypokalemia 02/22/2014   HTN (hypertension) 03/19/2012   Allergic rhinitis 01/15/2007    Past Surgical History:  Procedure Laterality Date   CARDIAC CATHETERIZATION N/A 10/08/2015   Procedure: Right/Left Heart Cath and Coronary Angiography;  Surgeon: Jolaine Artist, MD;  Location: [facility];  Service:  Cardiovascular;  Laterality: N/A;   CHOLECYSTECTOMY     Open   COLONOSCOPY WITH PROPOFOL N/A 06/07/2020   Procedure: COLONOSCOPY WITH PROPOFOL;  Surgeon: Jerene Bears, MD;  Location: [facility];  Service: Gastroenterology;  Laterality: N/A;   ESOPHAGOGASTRODUODENOSCOPY (EGD) WITH PROPOFOL N/A 06/07/2020   Procedure: ESOPHAGOGASTRODUODENOSCOPY (EGD) WITH PROPOFOL;  Surgeon: Jerene Bears, MD;  Location: [facility]OSCOPY;  Service: Gastroenterology;  Laterality: N/A;   POLYPECTOMY  06/07/2020   Procedure: POLYPECTOMY;  Surgeon: Jerene Bears, MD;  Location: [facility];  Service: Gastroenterology;;   RIGHT HEART CATH N/A 01/26/2017   Procedure: Right Heart Cath;  Surgeon: Jolaine Artist, MD;  Location: [facility] [facility];  Service: Cardiovascular;  Laterality: N/A;   RIGHT HEART CATH N/A 08/12/2017   Procedure: RIGHT HEART CATH;  Surgeon: Jolaine Artist, MD;  Location: [place] [facility];  Service: Cardiovascular;  Laterality: N/A;     OB History   No obstetric history on file.     Family History  Problem Relation Age of Onset   Glaucoma Brother    Vascular Disease Father        Died of complications from surgery   Heart attack Father    Asthma Mother        Died of asthma attack   Dementia Mother    Hyperlipidemia Mother    Hypertension Mother     Social History   Tobacco Use   Smoking status: Former    Packs/day: 1.00    Years: 40.00    Pack years: 40.00    Types: Cigarettes    Quit date: 02/22/2014    Years since quitting: 7.1   Smokeless tobacco: Never  Vaping Use   Vaping Use: Never used  Substance Use Topics   Alcohol use: No   Drug use: No    Home Medications Prior to Admission medications   Medication Sig Start Date End Date Taking? Authorizing Provider  acetaminophen (TYLENOL) 325 MG tablet Take 325 mg by mouth at bedtime as needed for moderate pain or headache.     [provider]  albuterol (VENTOLIN HFA) 108 (90 Base) MCG/ACT inhaler  INHALE 2 PUFFS INTO THE LUNGS EVERY 6 HOURS AS NEEDED FOR WHEEZING/SHORTNESS OF BREATH 07/12/20   Bedsole, Amy E, MD  ambrisentan (LETAIRIS) 10 MG tablet Take 1 tablet (10 mg total) by mouth daily. Please schedule an appointment for further refills 04/10/21   Bensimhon, Shaune Pascal, MD  amiodarone (PACERONE) 100 MG tablet TAKE 1 TABLET BY MOUTH EVERY DAY 09/27/20   Bensimhon, Shaune Pascal, MD  aspirin EC 81 MG EC tablet Take 1 tablet (81 mg total) by mouth daily. 10/09/15   Debbe Odea, MD  ciclopirox (PENLAC) 8 % solution Apply topically at bedtime. Apply  over nail and surrounding skin. Apply daily over previous coat. After seven (7) days, may remove with alcohol and continue cycle. 12/14/20   Bedsole, Amy E, MD  ferrous sulfate 325 (65 FE) MG EC tablet TAKE 1 TABLET BY MOUTH 2 TIMES DAILY WITH A MEAL. 12/28/20   Bedsole, Amy E, MD  furosemide (LASIX) 40 MG tablet TAKE 1 TABLET BY MOUTH TWICE A DAY 12/21/20   Bensimhon, Shaune Pascal, MD  ketoconazole (NIZORAL) 2 % cream Apply 1 application topically daily. 12/14/20   [name], MD  OXYGEN Inhale into the lungs. Using 4 liters    [provider]  pantoprazole (PROTONIX) 40 MG tablet TAKE 1 TABLET BY MOUTH EVERY DAY 01/21/21   Bedsole, Amy E, MD  potassium chloride SA (KLOR-CON M20) 20 MEQ tablet TAKE 1 TABLET BY MOUTH EVERY DAY 02/26/21   Bedsole, Amy E, MD  spironolactone (ALDACTONE) 25 MG tablet TAKE 1/2 TABLET BY MOUTH EVERY DAY 12/24/20   Bensimhon, Shaune Pascal, MD  tadalafil (CIALIS) 20 MG tablet Take 2 tablets (40 mg total) by mouth daily. 03/19/21   Bensimhon, Shaune Pascal, MD    Allergies    Amoxicillin-pot clavulanate, Cefdinir, Moxifloxacin, and Singulair [montelukast sodium]  Review of Systems   Review of Systems  Respiratory:  Positive for shortness of breath.   Ten systems reviewed and are negative for acute change, except as noted in the HPI.   Physical Exam Updated Vital Signs BP (!) 99/46 (BP Location: Right Arm)   Pulse 85   Temp 98.4 F  (36.9 C) (Oral)   Resp 18   Ht 5' 3.5" (1.613 m)   Wt 56.7 kg   SpO2 95%   BMI 21.80 kg/m   Physical Exam Vitals and nursing note reviewed.  Constitutional:      General: She is not in acute distress.    Appearance: She is well-developed. She is not diaphoretic.  HENT:     Head: Normocephalic and atraumatic.     Right Ear: External ear normal.     Left Ear: External ear normal.     Nose: Nose normal.     Mouth/Throat:     Mouth: Mucous membranes are moist.     Comments: Tongue is coated and white, white patches noted on the soft palate and small white patches on the oropharynx Eyes:     General: No scleral icterus.    Extraocular Movements: Extraocular movements intact.     Conjunctiva/sclera: Conjunctivae normal.     Pupils: Pupils are equal, round, and reactive to light.  Cardiovascular:     Rate and Rhythm: Normal rate and regular rhythm.     Heart sounds: Normal heart sounds. No murmur heard.   No friction rub. No gallop.  Pulmonary:     Effort: Pulmonary effort is normal. Prolonged expiration present. No respiratory distress.     Breath sounds: Decreased air movement present. Examination of the right-upper field reveals wheezing. Examination of the left-upper field reveals wheezing. Examination of the right-middle field reveals wheezing. Examination of the left-middle field reveals wheezing. Examination of the right-lower field reveals wheezing. Examination of the left-lower field reveals wheezing. Wheezing present. No rhonchi.  Abdominal:     General: Bowel sounds are normal. There is no distension.     Palpations: Abdomen is soft. There is no mass.     Tenderness: There is no abdominal tenderness. There is no guarding.  Musculoskeletal:     Cervical back: Normal range of motion.  Right lower leg: No edema.     Left lower leg: No edema.  Skin:    General: Skin is warm and dry.  Neurological:     Mental Status: She is alert and oriented to person, place, and time.   Psychiatric:        Behavior: Behavior normal.    ED Results / Procedures / Treatments   Labs (all labs ordered are listed, but only abnormal results are displayed) Labs Reviewed  RESP PANEL BY RT-PCR (FLU A&B, COVID) ARPGX2  GROUP A STREP BY PCR  BASIC METABOLIC PANEL  CBC WITH DIFFERENTIAL/PLATELET    EKG None  Radiology No results found.  Procedures Procedures   Medications Ordered in ED Medications  albuterol (PROVENTIL) (2.5 MG/3ML) 0.083% nebulizer solution 2.5 mg (has no administration in time range)  ipratropium (ATROVENT) nebulizer solution 0.5 mg (has no administration in time range)  magnesium sulfate IVPB 2 g 50 mL (has no administration in time range)  methylPREDNISolone sodium succinate (SOLU-MEDROL) 125 mg/2 mL injection 125 mg (has no administration in time range)  sodium chloride 0.9 % bolus 500 mL (has no administration in time range)    ED Course  I have reviewed the triage vital signs and the nursing notes.  Pertinent labs & imaging results that were available during my care of the patient were reviewed by me and considered in my medical decision making (see chart for details).    MDM Rules/Calculators/A&P                           75 year old female here with complaint of shortness of breath.The emergent differential diagnosis for shortness of breath includes, but is not limited to, Pulmonary edema, bronchoconstriction, Pneumonia, Pulmonary embolism, Pneumotherax/ Hemothorax, Dysrythmia, ACS.  Initially my highest consideration was for COPD exacerbation as patient has no evidence of volume overload and prolonged expiratory phase with inspiratory and expiratory wheezing.  However review of the chest x-ray shows bilateral pulmonary edema.  This may represent a mixed picture.  She has been given intervention with albuterol, magnesium, Solu-Medrol.  This has given her some minor improvement.  Patient does have a history of pulmonary hypertension and she  may get fluid backup in the lungs alone.  Her course is complicated by the fact that her blood pressure has been low.  This is likely due to her poor p.o. intake from the thrush.  I ordered and reviewed labs that include CBC without elevated white blood cell count.  She has a macrocytic anemia, BMP with slightly elevated creatinine just above her baseline renal insufficiency, group A strep is in pot process along with respiratory panel but I have low suspicion for COVID-19 infection.  Patient will need admission as she has acute on chronic hypoxic respiratory failure now requiring 6 L above her normal 4 L.  I also suspect she will need diuresis.  Final Clinical Impression(s) / ED Diagnoses Final diagnoses:  None    Rx / DC Orders ED Discharge Orders     None        Margarita Mail, PA-C 05/03/21 1757    Regan Lemming, MD 05/03/21 2121

## 2021-05-03 NOTE — Telephone Encounter (Signed)
Per chart review tab pt is at [facility] ED.

## 2021-05-03 NOTE — Telephone Encounter (Signed)
[facility] - Client TELEPHONE ADVICE RECORD [facility]urse Patient Name: ANNA-MARIE COLLER Gender: Female DOB: 1945/11/23 Age: 75 Y 4 D Return Phone Number: [phone] (Primary) Address: City/ State/ Zip: [place] [address] Client [facility] - Client Client Site [place] - Day Physician Eliezer Lofts - MD Contact Type Call Who Is Calling Patient / Member / Family / Caregiver Call Type Triage / Clinical Caller Name [name] Riedinger Relationship To Patient Spouse Return Phone Number [phone] (Primary) Chief Complaint BREATHING - shortness of breath or sounds breathless Reason for Call Symptomatic / Request for [place] states his wife has a low oxygen level in the 80's. She has an appointment today at 340pm. Sx of sore throat, dehydration, cough, some breathing issues. Sounds very rhaspy. Translation No Nurse Assessment Nurse: Loletha Carrow, RN, Ronalee Belts Date/Time (Eastern Time): 05/03/2021 8:28:52 AM Confirm and document reason for call. If symptomatic, describe symptoms. ---Caller states: My wife has a low oxygen level in the 80's. She has an appointment today at 340pm. Sx of sore throat does not want to eat or drink , dehydration peeing every 12 hr, wet cough, chronic breathing issues. Denies any other symptoms Does the patient have any new or worsening symptoms? ---Yes Will a triage be completed? ---Yes Related visit to physician within the last 2 weeks? ---No Does the PT have any chronic conditions? (i.e. diabetes, asthma, this includes High risk factors for pregnancy, etc.) ---Yes List chronic conditions. ---CHF, COPD, Is this a behavioral health or substance abuse call? ---No Guidelines Guideline Title Affirmed Question Affirmed Notes Nurse Date/Time (Eastern Time) Breathing Difficulty Slow, shallow and weak breathing Emch, RN, Ronalee Belts 05/03/2021  8:32:26 AM PLEASE NOTE: All timestamps contained within this report are represented as [place] Standard Time. CONFIDENTIALTY NOTICE: This fax transmission is intended only for the addressee. It contains information that is legally privileged, confidential or otherwise protected from use or disclosure. If you are not the intended recipient, you are strictly prohibited from reviewing, disclosing, copying using or disseminating any of this information or taking any action in reliance on or regarding this information. If you have received this fax in error, please notify us immediately by telephone so that we can arrange for its return to [place]. Phone: [phone], Toll-Free: [phone], Fax: [phone] Page: 2 of 2 Call Id: [id number] [place]. Time [name] Time) Disposition Final User 05/03/2021 8:24:43 AM Send to Urgent Mellody Dance 05/03/2021 8:40:37 AM 911 Outcome Documentation Emch, RN, Ronalee Belts Reason: Called Pt back after 5 min EMS on the way 05/03/2021 8:34:52 AM Call EMS 911 Now Yes Emch, RN, Vicenta Dunning Disagree/Comply Comply Caller Understands Yes PreDisposition InappropriateToAsk Care Advice Given Per Guideline CALL EMS 911 NOW: * Immediate medical attention is needed. You need to hang up and call 911 (or an ambulance). CARE ADVICE given per Breathing Difficulty (Adult) guideline.

## 2021-05-03 NOTE — H&P (Addendum)
History and Physical    Linda Stevens WNI:[id number] DOB: 1945-10-05 DOA: 05/03/2021  PCP: [name], MD (Confirm with patient/family/NH records and if not entered, this has to be entered at [facility] point of entry) Patient coming from: Home  I have personally briefly reviewed patient's old medical records in [place]  Chief Complaint: Cough, SOB  HPI: Linda Stevens is a 75 y.o. female with medical history significant of severe pulmonary HTN, chronic diastolic CHF, COPD Gold stage III, chronic hypoxic respite failure on 4 L continuously, CKD stage III, presented with increasing shortness of breath over 1 week.  Patient has chronic worsening symptoms such as cough and exertional dyspnea however starting 1 week ago, her symptoms became significantly worse, now only minimum activity can trigger significant shortness of breath.  She also has had worsening of dry cough but no fever chills or chest pains.  Patient has been following with cardiologist for her severe pulmonary hypertension and diastolic CHF.  And she used to take 40 mg Lasix in the morning, and as needed Lasix 40 mg in the evening for edema or worsening symptoms.  However she told me that she has not been taking extra evening dose of Lasix recently.  In the past, patient has had to recurrent pericardial effusion and pleural effusions, appears her pericardial effusion responded to Lasix and steroid.  ED Course: Blood pressure borderline low,ED gave patient 500 mL IV bolus BP remained low.  Patient asymptomatic continue to complain about shortness of breath.  X-ray shows bilateral pleural effusion.  Review of Systems: As per HPI otherwise 14 point review of systems negative.    Past Medical History:  Diagnosis Date   Acute respiratory failure with hypoxia and hypercapnea 02/26/2014   Allergic rhinitis due to pollen    Allergy    Anemia 06/12/2020   Arthritis    Asthma    CHF (congestive heart failure) (HCC)     COPD (chronic obstructive pulmonary disease) (HCC)    Diastolic dysfunction    a. 02/2014 Echo: EF 65-70%, Gr 1 DD, RVH;  b. 09/2015 Echo: EF 55%, Gr 1 DD.   Emphysema of lung (HCC)    H/O seasonal allergies    History of chicken pox    History of tobacco abuse    a. Quit 2015.   History of UTI    Hypertension    Hypertensive heart disease    Lower extremity edema    Pericardial effusion    a. 09/2015 Echo: Mod eff w/o tamponade.   Right heart failure with reduced right ventricular function ([place])    a. 09/2015 Echo: EF 55%, Gr 1 DD, D shaped IV septum, sev dil RV with mod reduced fxn, mod dil RA, RV-RA grad 69mHg, PASP 856mg, mod pericard eff w/o tamponade.   Severe Pulmonary Hypertension    a. 09/2015 Echo: PASP 8768m.    Past Surgical History:  Procedure Laterality Date   CARDIAC CATHETERIZATION N/A 10/08/2015   Procedure: Right/Left Heart Cath and Coronary Angiography;  Surgeon: DanJolaine ArtistD;  Location: [facility];  Service: Cardiovascular;  Laterality: N/A;   CHOLECYSTECTOMY     Open   COLONOSCOPY WITH PROPOFOL N/A 06/07/2020   Procedure: COLONOSCOPY WITH PROPOFOL;  Surgeon: PyrJerene BearsD;  [facility];  Laterality: N/A;   ESOPHAGOGASTRODUODENOSCOPY (EGD) WITH PROPOFOL N/A 06/07/2020   Procedure: ESOPHAGOGASTRODUODENOSCOPY (EGD) WITH PROPOFOL;  Surgeon: PyrJerene BearsD;  [facility];  Service: Gastroenterology;  Laterality: N/A;   POLYPECTOMY  06/07/2020   Procedure: POLYPECTOMY;  Surgeon: Jerene Bears, MD;  Location: [facility];  Service: Gastroenterology;;   RIGHT HEART CATH N/A 01/26/2017   Procedure: Right Heart Cath;  Surgeon: Jolaine Artist, MD;  Location: [facility];  Service: Cardiovascular;  Laterality: N/A;   RIGHT HEART CATH N/A 08/12/2017   Procedure: RIGHT HEART CATH;  Surgeon: Jolaine Artist, MD;  Location: [facility];  Service: Cardiovascular;  Laterality: N/A;      reports that she quit smoking about 7 years ago. Her smoking use included cigarettes. She has a 40.00 pack-year smoking history. She has never used smokeless tobacco. She reports that she does not drink alcohol and does not use drugs.  Allergies  Allergen Reactions   Amoxicillin-Pot Clavulanate     REACTION: gi upset Has patient had a PCN reaction causing immediate rash, facial/tongue/throat swelling, SOB or lightheadedness with hypotension: No Has patient had a PCN reaction causing severe rash involving mucus membranes or skin necrosis: No Has patient had a PCN reaction that required hospitalization : No Has patient had a PCN reaction occurring within the last 10 years: No If all of the above answers are "NO", then may proceed with Cephalosporin use.    Cefdinir     REACTION: gi  upset   Moxifloxacin     REACTION: rash   Singulair [Montelukast Sodium]     Itching     Family History  Problem Relation Age of Onset   Glaucoma Brother    Vascular Disease Father        Died of complications from surgery   Heart attack Father    Asthma Mother        Died of asthma attack   Dementia Mother    Hyperlipidemia Mother    Hypertension Mother      Prior to Admission medications   Medication Sig Start Date End Date Taking? Authorizing Provider  acetaminophen (TYLENOL) 325 MG tablet Take 325 mg by mouth at bedtime as needed for moderate pain or headache.     [provider]  albuterol (VENTOLIN HFA) 108 (90 Base) MCG/ACT inhaler INHALE 2 PUFFS INTO THE LUNGS EVERY 6 HOURS AS NEEDED FOR WHEEZING/SHORTNESS OF BREATH Patient taking differently: Inhale 2 puffs into the lungs every 6 (six) hours as needed for wheezing or shortness of breath. 07/12/20   Bedsole, Amy E, MD  ambrisentan (LETAIRIS) 10 MG tablet Take 1 tablet (10 mg total) by mouth daily. Please schedule an appointment for further refills 04/10/21   Bensimhon, Shaune Pascal, MD  amiodarone (PACERONE) 100 MG tablet TAKE 1  TABLET BY MOUTH EVERY DAY Patient taking differently: Take 100 mg by mouth daily. 09/27/20   Bensimhon, Shaune Pascal, MD  aspirin EC 81 MG EC tablet Take 1 tablet (81 mg total) by mouth daily. 10/09/15   Debbe Odea, MD  ciclopirox (PENLAC) 8 % solution Apply topically at bedtime. Apply over nail and surrounding skin. Apply daily over previous coat. After seven (7) days, may remove with alcohol and continue cycle. 12/14/20   Bedsole, Amy E, MD  ferrous sulfate 325 (65 FE) MG EC tablet TAKE 1 TABLET BY MOUTH 2 TIMES DAILY WITH A MEAL. Patient taking differently: Take 325 mg by mouth 2 (two) times daily with a meal. 12/28/20   Bedsole, Amy E, MD  furosemide (LASIX) 40 MG tablet TAKE 1 TABLET BY MOUTH TWICE A DAY Patient taking differently: Take  40 mg by mouth 2 (two) times daily. 12/21/20   Bensimhon, Shaune Pascal, MD  ketoconazole (NIZORAL) 2 % cream Apply 1 application topically daily. 12/14/20   Bedsole, Amy E, MD  levothyroxine (SYNTHROID) 75 MCG tablet Take 75 mcg by mouth daily. 04/19/21   [provider]  OXYGEN Inhale into the lungs. Using 4 liters    [provider]  pantoprazole (PROTONIX) 40 MG tablet TAKE 1 TABLET BY MOUTH EVERY DAY 01/21/21   Bedsole, Amy E, MD  potassium chloride SA (KLOR-CON M20) 20 MEQ tablet TAKE 1 TABLET BY MOUTH EVERY DAY Patient taking differently: Take 20 mEq by mouth daily. 02/26/21   Bedsole, Amy E, MD  spironolactone (ALDACTONE) 25 MG tablet TAKE 1/2 TABLET BY MOUTH EVERY DAY Patient taking differently: Take 12.5 mg by mouth daily. 12/24/20   Bensimhon, Shaune Pascal, MD  tadalafil (CIALIS) 20 MG tablet Take 2 tablets (40 mg total) by mouth daily. 03/19/21   Bensimhon, Shaune Pascal, MD    Physical Exam: Vitals:   05/03/21 1230 05/03/21 1300 05/03/21 1315 05/03/21 1330  BP: (!) 100/53 (!) 104/51 (!) 116/42 (!) 104/44  Pulse: 90 80 90 84  Resp: 19 (!) 22 (!) 22 (!) 21  Temp:      TempSrc:      SpO2: 97% 99% 92% 95%  Weight:      Height:         Constitutional: NAD, calm, comfortable Vitals:   05/03/21 1230 05/03/21 1300 05/03/21 1315 05/03/21 1330  BP: (!) 100/53 (!) 104/51 (!) 116/42 (!) 104/44  Pulse: 90 80 90 84  Resp: 19 (!) 22 (!) 22 (!) 21  Temp:      TempSrc:      SpO2: 97% 99% 92% 95%  Weight:      Height:       Eyes: PERRL, lids and conjunctivae normal ENMT: Mucous membranes are moist. Posterior pharynx clear of any exudate or lesions.Normal dentition.  Neck: normal, supple, no masses, no thyromegaly Respiratory: Diminished breathing sound bilaterally, diffuse wheezing, fine crackles on bilateral bases.  Increasing respiratory effort. No accessory muscle use.  Cardiovascular: Regular rate and rhythm, diminished S1 on heart base, loud systolic murmur on heart base. No extremity edema. 2+ pedal pulses. No carotid bruits.  Abdomen: no tenderness, no masses palpated. No hepatosplenomegaly. Bowel sounds positive.  Musculoskeletal: no clubbing / cyanosis. No joint deformity upper and lower extremities. Good ROM, no contractures. Normal muscle tone.  Skin: no rashes, lesions, ulcers. No induration Neurologic: CN 2-12 grossly intact. Sensation intact, DTR normal. Strength 5/5 in all 4.  Psychiatric: Normal judgment and insight. Alert and oriented x 3. Normal mood.     Labs on Admission: I have personally reviewed following labs and imaging studies  CBC: Recent Labs  Lab 05/03/21 1008  WBC 4.7  NEUTROABS 3.2  HGB 10.2*  HCT 34.7*  MCV 103.3*  PLT 109*   Basic Metabolic Panel: Recent Labs  Lab 05/03/21 1008  NA 141  K 4.7  CL 102  CO2 35*  GLUCOSE 94  BUN 24*  CREATININE 1.51*  CALCIUM 8.5*   GFR: Estimated Creatinine Clearance: 28.1 mL/min (A) (by C-G formula based on SCr of 1.51 mg/dL (H)). Liver Function Tests: No results for input(s): AST, ALT, ALKPHOS, BILITOT, PROT, ALBUMIN in the last 168 hours. No results for input(s): LIPASE, AMYLASE in the last 168 hours. No results for input(s):  AMMONIA in the last 168 hours. Coagulation Profile: No results for input(s): INR,  PROTIME in the last 168 hours. Cardiac Enzymes: No results for input(s): CKTOTAL, CKMB, CKMBINDEX, TROPONINI in the last 168 hours. BNP (last 3 results) No results for input(s): PROBNP in the last 8760 hours. HbA1C: No results for input(s): HGBA1C in the last 72 hours. CBG: No results for input(s): GLUCAP in the last 168 hours. Lipid Profile: No results for input(s): CHOL, HDL, LDLCALC, TRIG, CHOLHDL, LDLDIRECT in the last 72 hours. Thyroid Function Tests: No results for input(s): TSH, T4TOTAL, FREET4, T3FREE, THYROIDAB in the last 72 hours. Anemia Panel: No results for input(s): VITAMINB12, FOLATE, FERRITIN, TIBC, IRON, RETICCTPCT in the last 72 hours. Urine analysis:    Component Value Date/Time   COLORURINE YELLOW 02/23/2020 1319   APPEARANCEUR HAZY (A) 02/23/2020 1319   LABSPEC 1.019 02/23/2020 1319   PHURINE 5.0 02/23/2020 1319   GLUCOSEU NEGATIVE 02/23/2020 1319   GLUCOSEU NEGATIVE 07/07/2017 1625   HGBUR NEGATIVE 02/23/2020 1319   HGBUR negative 12/22/2007 1139   BILIRUBINUR NEGATIVE 02/23/2020 1319   KETONESUR NEGATIVE 02/23/2020 1319   PROTEINUR NEGATIVE 02/23/2020 1319   UROBILINOGEN 0.2 07/07/2017 1625   NITRITE NEGATIVE 02/23/2020 1319   LEUKOCYTESUR NEGATIVE 02/23/2020 1319    Radiological Exams on Admission: DG Chest Port 1 View  Result Date: 05/03/2021 CLINICAL DATA:  75 year old female with history of worsening shortness of breath over the past week. Low blood pressure. EXAM: PORTABLE CHEST 1 VIEW COMPARISON:  Chest x-ray 06/05/2020. FINDINGS: Extensive ill-defined opacities and areas of interstitial prominence throughout the mid to lower lungs bilaterally, most severe in the lung bases. Small bilateral pleural effusions (right greater than left). Mild cephalization of the pulmonary vasculature. No pneumothorax. Mild cardiomegaly. Upper mediastinal contours are within normal  limits. Atherosclerotic calcifications in the thoracic aorta. IMPRESSION: 1. The appearance of the chest suggests congestive heart failure, as above. 2. Bibasilar opacities likely reflect areas of subsegmental atelectasis, however, underlying airspace consolidation is difficult to entirely exclude. 3. Aortic atherosclerosis. Electronically Signed   By: Vinnie Langton M.D.   On: 05/03/2021 10:50    EKG: Independently reviewed.  Sinus, chronic T wave inversion on lateral leads  Assessment/Plan Active Problems:   Chronic diastolic CHF (congestive heart failure) (HCC)   CHF (congestive heart failure) (HCC)  (please populate well all problems here in Problem List. (For example, if patient is on BP meds at home and you resume or decide to hold them, it is a problem that needs to be her. Same for CAD, COPD, HLD and so on)  Acute on chronic diastolic CHF decompensation -Symptom sinus falls fluid overload despite blood pressure on the low side.  Cost office records show her blood pressure at baseline has been in the lower 100s. -Starting Lasix 20 mg IV BID -Discussed with on-call cardiology, will help [place] manage decompensated CHF. -For reaccumulated bilateral pleural effusion, will check CT chest, also want pericardial effusion.  Echocardiogram ordered.  Acute COPD exacerbation -With significantly worsening ulcers bicarb level, concern throughout CO2 retention.  Check VBG. -DuoNeb every 6 hours plus as needed albuterol -Advair  Severe pulmonary hypertension with signs of cor pulmonale -Continue Ambrisentan and sildenafil -Overall prognosis is poor.  Discussed with patient and husband at bedside.  Patient is DNR.  CKD stage III -Creatinine level stable, symptoms or signs of fluid overload, diuresis as above.  Chronic anemia -H&H stable.  No symptoms or signs of bleeding  Hyperthyroid -Not on synthroid as patient claims that "it makes me feel sick", will check TSH.  DVT prophylaxis: Heparin  subcu Code Status: DNR Family Communication: Husband at bedside Disposition Plan: Expect less than 2 midnight hospital stay to manage difficult CHF and pulmonary hypertension Consults called: Cardiology Admission status: PCU   Lequita Halt MD [facility] Pager [phone]  05/03/2021, 1:49 PM

## 2021-05-03 NOTE — ED Triage Notes (Signed)
Pt BIB [facility] for SOB. Hx COPD. Pt reports increasing SOB since Friday, worse with exertion. Pt endorses cough, worse over last few days. Pt has a sore throat and has decreased PO intake. Pt wears 4L O2 via  all the time, SpO2 was 86% on 4L. Placed on 6L, 100% on 6L. Wheezing in L upper lobe. Has been using albuterol treatments at home with some relief. Initial BP 80/40, 104/70 after 176m NS.

## 2021-05-03 NOTE — Progress Notes (Signed)
Pharmacy Consult for Pulmonary Hypertension Treatment   Indication - Continuation of prior to admission medication   Patient is 75 y.o.  with history of PAH on chronic Ambrisentan (Letairis) PTA and will be continued while hospitalized. REMS ID # [id number]  Continuing this medication order as an inpatient requires that monitoring parameters per REMS requirements must be met.   Chronic therapy is under the supervision of Dr. Haroldine Laws who is enrolled in the REMS program and is being notified of continuation of therapy. A staff message in EPIC has been sent notifying the certified prescriber.  Per patient report has previously been educated on Hepatotoxicity . On admission pregnancy risk has been assessed and no monitoring required. Hepatic function has been evaluated. AST/ALT appropriate to continue medication at this time.  Hepatic Function Latest Ref Rng & Units 02/29/2020 02/26/2020 02/25/2020  Total Protein 6.5 - 8.1 g/dL 5.3(L) 5.5(L) -  Albumin 3.5 - 5.0 g/dL 2.8(L) 2.8(L) 2.6(L)  AST 15 - 41 U/L 17 13(L) -  ALT 0 - 44 U/L 22 27 -  Alk Phosphatase 38 - 126 U/L 78 78 -  Total Bilirubin 0.3 - 1.2 mg/dL 1.0 0.6 -  Bilirubin, Direct 0.0 - 0.2 mg/dL - - -   If any question arise or pregnancy is identified during hospitalization, contact for bosentan and macitentan: [phone]; ambrisentan: [phone].  Thank for you allowing [place] to participate in the care of this patient.  Joetta Manners, PharmD, [facility] Emergency Medicine Clinical Pharmacist ED RPh Phone: [place]: [phone]

## 2021-05-03 NOTE — Progress Notes (Signed)
REDS VEST / Clip  READING=  18 % CHEST RULER= [place].D. CPP, BCPS Clinical Pharmacist [phone] 05/03/2021 3:02 PM

## 2021-05-04 ENCOUNTER — Inpatient Hospital Stay (HOSPITAL_COMMUNITY): Payer: Medicare HMO

## 2021-05-04 DIAGNOSIS — J9621 Acute and chronic respiratory failure with hypoxia: Secondary | ICD-10-CM | POA: Diagnosis not present

## 2021-05-04 DIAGNOSIS — I5032 Chronic diastolic (congestive) heart failure: Secondary | ICD-10-CM | POA: Diagnosis not present

## 2021-05-04 DIAGNOSIS — I5031 Acute diastolic (congestive) heart failure: Secondary | ICD-10-CM | POA: Diagnosis not present

## 2021-05-04 DIAGNOSIS — I313 Pericardial effusion (noninflammatory): Secondary | ICD-10-CM | POA: Diagnosis not present

## 2021-05-04 LAB — CBC
HCT: 29.4 % — ABNORMAL LOW (ref 36.0–46.0)
Hemoglobin: 9 g/dL — ABNORMAL LOW (ref 12.0–15.0)
MCH: 31 pg (ref 26.0–34.0)
MCHC: 30.6 g/dL (ref 30.0–36.0)
MCV: 101.4 fL — ABNORMAL HIGH (ref 80.0–100.0)
Platelets: 141 10*3/uL — ABNORMAL LOW (ref 150–400)
RBC: 2.9 MIL/uL — ABNORMAL LOW (ref 3.87–5.11)
RDW: 17.7 % — ABNORMAL HIGH (ref 11.5–15.5)
WBC: 4.2 10*3/uL (ref 4.0–10.5)
nRBC: 0 % (ref 0.0–0.2)

## 2021-05-04 LAB — ECHOCARDIOGRAM COMPLETE
Area-P 1/2: 2.42 cm2
Height: 63 in
S' Lateral: 2.4 cm
Weight: 2161.6 oz

## 2021-05-04 LAB — BASIC METABOLIC PANEL
Anion gap: 7 (ref 5–15)
BUN: 33 mg/dL — ABNORMAL HIGH (ref 8–23)
CO2: 29 mmol/L (ref 22–32)
Calcium: 8 mg/dL — ABNORMAL LOW (ref 8.9–10.3)
Chloride: 102 mmol/L (ref 98–111)
Creatinine, Ser: 1.91 mg/dL — ABNORMAL HIGH (ref 0.44–1.00)
GFR, Estimated: 27 mL/min — ABNORMAL LOW (ref >60)
Glucose, Bld: 139 mg/dL — ABNORMAL HIGH (ref 70–99)
Potassium: 5.1 mmol/L (ref 3.5–5.1)
Sodium: 138 mmol/L (ref 135–145)

## 2021-05-04 MED ORDER — MELATONIN 3 MG PO TABS
6.0000 mg | ORAL_TABLET | Freq: Every evening | ORAL | Status: DC | PRN
  Administered 2021-05-04: 6 mg via ORAL
  Filled 2021-05-04: qty 2

## 2021-05-04 NOTE — Progress Notes (Signed)
Advanced Heart Failure Rounding Note   Subjective:    Breathing better with prednisone. No orthopnea or PND.   Got 60 IV lasix no major urine output.   CT chest small effusions. No acute. Small pericardial effusion (decreased)   Objective:   Weight Range:  Vital Signs:   Temp:  [97.6 F (36.4 C)-98.4 F (36.9 C)] 97.9 F (36.6 C) (08/27 0716) Pulse Rate:  [67-90] 76 (08/27 0834) Resp:  [15-24] 18 (08/27 0716) BP: (92-127)/(40-58) 92/41 (08/27 0834) SpO2:  [92 %-100 %] 97 % (08/27 0814) Weight:  [60.5 kg-61.3 kg] 61.3 kg (08/27 0241) Last BM Date: 05/02/21  Weight change: Filed Weights   05/03/21 1001 05/03/21 1600 05/04/21 0241  Weight: 56.7 kg 60.5 kg 61.3 kg    Intake/Output:   Intake/Output Summary (Last 24 hours) at 05/04/2021 1031 Last data filed at 05/04/2021 0830 Gross per 24 hour  Intake 1153.88 ml  Output 650 ml  Net 503.88 ml     Physical Exam: General:  Sitting up in bed No resp difficulty HEENT: normal  + telengectasias  Neck: supple. JVP 7 with prominent v waves  Carotids 2+ bilat; no bruits. No lymphadenopathy or thryomegaly appreciated. Cor: PMI nondisplaced. Regular rate & rhythm. No rubs, gallops or murmurs. Lungs: decreased throughout  Abdomen: soft, nontender, nondistended. No hepatosplenomegaly. No bruits or masses. Good bowel sounds. Extremities: no cyanosis, clubbing, rash, edema Neuro: alert & orientedx3, cranial nerves grossly intact. moves all 4 extremities w/o difficulty. Affect pleasant  Telemetry: SR 70-80s Personally reviewed   Labs: Basic Metabolic Panel: Recent Labs  Lab 05/03/21 1008 05/03/21 1417 05/04/21 0406  NA 141 141 138  K 4.7 4.7 5.1  CL 102  --  102  CO2 35*  --  29  GLUCOSE 94  --  139*  BUN 24*  --  33*  CREATININE 1.51*  --  1.91*  CALCIUM 8.5*  --  8.0*    Liver Function Tests: Recent Labs  Lab 05/03/21 1330  AST 18  ALT 7  ALKPHOS 48  BILITOT 0.5  PROT 6.4*  ALBUMIN 2.6*   No results  for input(s): LIPASE, AMYLASE in the last 168 hours. No results for input(s): AMMONIA in the last 168 hours.  CBC: Recent Labs  Lab 05/03/21 1008 05/03/21 1417 05/04/21 0406  WBC 4.7  --  4.2  NEUTROABS 3.2  --   --   HGB 10.2* 9.5* 9.0*  HCT 34.7* 28.0* 29.4*  MCV 103.3*  --  101.4*  PLT 136*  --  141*    Cardiac Enzymes: No results for input(s): CKTOTAL, CKMB, CKMBINDEX, TROPONINI in the last 168 hours.  BNP: BNP (last 3 results) Recent Labs    07/02/20 1441 11/02/20 1413 05/03/21 1105  BNP 344.5* 448.2* 358.4*    ProBNP (last 3 results) No results for input(s): PROBNP in the last 8760 hours.    Other results:  Imaging: CT CHEST WO CONTRAST  Result Date: 05/03/2021 CLINICAL DATA:  Abnormal chest radiographs, pleural effusions EXAM: CT CHEST WITHOUT CONTRAST TECHNIQUE: Multidetector CT imaging of the chest was performed following the standard protocol without IV contrast. COMPARISON:  Same-day chest radiographs, CT chest angiogram, 02/19/2020 FINDINGS: Cardiovascular: Aortic atherosclerosis. Cardiomegaly. Three-vessel coronary artery calcifications. Gross enlargement of the main pulmonary artery measuring up to 4.0 cm in caliber. Small pericardial effusion, diminished compared to prior examination. Mediastinum/Nodes: Unchanged prominent mediastinal and hilar lymph nodes. Thyroid gland, trachea, and esophagus demonstrate no significant findings. Lungs/Pleura: Moderate centrilobular emphysema.  There are numerous scattered, somewhat ill-defined ground-glass and heterogeneous airspace opacities throughout the lungs, most concentrated in the left upper lobe (series 4, image 61, 76, 53). Unchanged small, loculated bilateral pleural effusions associated atelectasis or consolidation. Upper Abdomen: No acute abnormality. Coarse, nodular contour of the liver. Musculoskeletal: No chest wall mass or suspicious bone lesions identified. IMPRESSION: 1. There are numerous scattered, somewhat  ill-defined ground-glass and heterogeneous airspace opacities throughout the lungs, most concentrated in the left upper lobe, new compared to prior examination. Findings are most consistent with multifocal infection. 2. Unchanged small, loculated bilateral pleural effusions and associated atelectasis or consolidation. 3. Moderate centrilobular emphysema. 4. Cardiomegaly with small pericardial effusion, diminished compared to prior examination. 5. Gross enlargement of the main pulmonary artery, as can be seen with pulmonary hypertension. 6. Coronary artery disease. 7. Coarse, nodular contour of the liver in the included upper abdomen, suggestive of cirrhosis. Aortic Atherosclerosis (ICD10-I70.0) and Emphysema (ICD10-J43.9). Electronically Signed   By: Eddie Candle M.D.   On: 05/03/2021 16:06   DG Chest Port 1 View  Result Date: 05/03/2021 CLINICAL DATA:  75 year old female with history of worsening shortness of breath over the past week. Low blood pressure. EXAM: PORTABLE CHEST 1 VIEW COMPARISON:  Chest x-ray 06/05/2020. FINDINGS: Extensive ill-defined opacities and areas of interstitial prominence throughout the mid to lower lungs bilaterally, most severe in the lung bases. Small bilateral pleural effusions (right greater than left). Mild cephalization of the pulmonary vasculature. No pneumothorax. Mild cardiomegaly. Upper mediastinal contours are within normal limits. Atherosclerotic calcifications in the thoracic aorta. IMPRESSION: 1. The appearance of the chest suggests congestive heart failure, as above. 2. Bibasilar opacities likely reflect areas of subsegmental atelectasis, however, underlying airspace consolidation is difficult to entirely exclude. 3. Aortic atherosclerosis. Electronically Signed   By: Vinnie Langton M.D.   On: 05/03/2021 10:50     Medications:     Scheduled Medications:  ambrisentan  10 mg Oral Daily   amiodarone  100 mg Oral Daily   aspirin EC  81 mg Oral Daily   budesonide  (PULMICORT) nebulizer solution  0.5 mg Nebulization BID   ferrous sulfate  325 mg Oral BID WC   fluticasone furoate-vilanterol  1 puff Inhalation Daily   guaiFENesin  1,200 mg Oral BID   heparin  5,000 Units Subcutaneous Q12H   ipratropium-albuterol  3 mL Nebulization TID   ketoconazole  1 application Topical Daily   nystatin  5 mL Oral QID   pantoprazole  40 mg Oral Daily   potassium chloride SA  20 mEq Oral Daily   predniSONE  40 mg Oral Q breakfast   spironolactone  12.5 mg Oral Daily   tadalafil  40 mg Oral Daily    Infusions:   PRN Medications: acetaminophen, albuterol   Assessment/Plan:   1.  A/C respiratory failure with hypoxia: - On 4L 02 at home, requiring 6L on initial presentation. Down to 4L at time of evaluation. - Suspect primarily d/t COPD exacerbation. Much improved with prednisone  - CT chest notable for emphysema. No acute process - Management per TR[facility]   2. Chronic diastolic HF with RV failure: - Repeat RHC 12/18 with mild PAH (mean PA 33) - Echo 5/18 with complete resolution of RV strain. No significant TR to estimate RVSP - Echo 4/19 with EF 65% no evidence of RV strain  - echo  12/09/19 EF 70-75% RV normal  - Echo 11/21 EF 75% RV normal no evidence PAH - Resolution of PAH on recent  echos - Auto-immune panel previously negative. Suspicion for CTD. - Admitted with worsening dyspnea and hypoxia. Small pleural effusions noted on chest x-ray. Weight actually down 7 lb from last visit in February.ReDS 18%. BNP ordered. - Got 1 dose IV lasix with minimal response  - Echo today EF 60-65% RV ok. No significant effusion    3. PAH - suspect combination of WHO Group I and III PAH - On Letairis and Tadalafil. Previously discussed adding Selexipag but pressures down on last cath/echo   4. Recurrent R pleural effusion - Given improvement in PA pressures doubt effusion related to severe PAH/ Previous ab CT with evidence of cirrhosis.   -  Fluid analysis =  transudative.  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 Now controlled with with diuresis - Small bilateral effusions noted on CXR/CT this admit   5.  Pericardial effusion: - small effusion on CT yesterday (improved)   6.  PAF: - NSR today.  - On amio 100 mg daily. Dr. Vaughan Browner planning to D/C at next clinic f/u. - Eliquis stopped due to severe LGIB 9/21 (hgb 3.1). Will not restart with AVMs   7. Cirrhosis - due to RHF. Hepatitis serologies are negative. Possibly due to right heart failure - given EF > 75% may need to consider propanolol +/- midodrine to avoid high-output HF at some point    Length of Stay: 1   [name] MD 05/04/2021, 10:31 AM  Advanced Heart Failure Team Pager [phone] (M-F; 7a - 4p)  Please contact [facility] for night-coverage after hours (4p -7a ) and weekends on [email or web address]

## 2021-05-04 NOTE — Evaluation (Addendum)
Physical Therapy Evaluation Patient Details Name: Linda Stevens MRN: [id number] DOB: Jan 25, 1946 Today's Date: 05/04/2021   History of Present Illness  75 y.o. female admitted on 05/03/21 for cough, SOB.  Pt dx with acute on chronic diastolic CHF, acute COPD exacerbation, acute on chronic hypoxic respiratory failure.  Pt with significant PMH of asthma, emphysema, HTN, pericardial effusion, pulmonary HTN, PAF, CKD, liver cirrhosis, hypothyroid, chronic normocytic anemia.  Clinical Impression  Pt limited by SOB/DOE during mobility OOB to [facility].  O2 sats lowest were 88% on 4L O2  (her normal for home), however, DOE 3/4 taking 5 mins to recover her breath with very little activity.  BPs are very soft, but held stead and did not drop with activity.  Pt would benefit from attempts at gait progression with rollator next session.   PT to follow acutely for deficits listed below.      05/04/21 1427  Vital Signs  Patient Position (if appropriate) Orthostatic Vitals  Orthostatic Lying   BP- Lying 98/43  Pulse- Lying 77  Orthostatic Sitting  BP- Sitting 94/47  Pulse- Sitting 81  Orthostatic Standing at 0 minutes  BP- Standing at 0 minutes 109/46  Pulse- Standing at 0 minutes 81  Oxygen Therapy  SpO2 97 %  O2 Device Nasal Cannula  O2 Flow Rate (L/min) 4 L/min      Follow Up Recommendations Home health PT    Equipment Recommendations  None recommended by PT    Recommendations for Other Services       Precautions / Restrictions Precautions Precautions: Fall;Other (comment) Precaution Comments: monitor vitals (BP and O2)      Mobility  Bed Mobility Overal bed mobility: Needs Assistance Bed Mobility: Supine to Sit;Sit to Supine     Supine to sit: Min assist Sit to supine: Min assist   General bed mobility comments: Min hand held assist to come up to sitting and to return to supine.  HOB elevated, use of rail and therapist's hand.    Transfers Overall transfer level: Needs  assistance Equipment used: 1 person hand held assist Transfers: Sit to/from [facility] Sit to Stand: Min assist Stand pivot transfers: Min assist       General transfer comment: Heavy min assist to stand and turn to the [facility], stand and get back into the bed.  DOE with just this transfer 3/4, O2 down to 88% with 5 min recovery period.  HR 90s, BPs steady at 90s/40s throughout session.  Ambulation/Gait             General Gait Details: was not ready for ambulation. I discussed the next therapist will bring a rollator so that she could take rest breaks during attempts at ambulation.  Stairs            Wheelchair Mobility    Modified Rankin (Stroke Patients Only)       Balance Overall balance assessment: Needs assistance Sitting-balance support: No upper extremity supported;Feet supported Sitting balance-Leahy Scale: Fair     Standing balance support: Bilateral upper extremity supported;No upper extremity supported;Single extremity supported Standing balance-Leahy Scale: Poor Standing balance comment: needs external support from therapist in standing.                             Pertinent Vitals/Pain Pain Assessment: No/denies pain    Home Living Family/patient expects to be discharged to:: Private residence Living Arrangements: Spouse/significant other Available Help at Discharge: Family;Available 24 hours/day  Type of Home: House Home Access: Stairs to enter     Home Layout: One level Home Equipment: Environmental consultant - 2 wheels;Shower seat Additional Comments: uses 4 L O2 at baseline at home    Prior Function Level of Independence: Needs assistance   Gait / Transfers Assistance Needed: Independent with amb without assistive device  ADL's / Homemaking Assistance Needed: Husband assist in/out of tub and does most of the cleaning and cooking        Hand Dominance   Dominant Hand: Right    Extremity/Trunk Assessment   Upper  Extremity Assessment Upper Extremity Assessment: Generalized weakness    Lower Extremity Assessment Lower Extremity Assessment: Generalized weakness    Cervical / Trunk Assessment Cervical / Trunk Assessment: Normal  Communication   Communication: No difficulties  Cognition Arousal/Alertness: Awake/alert Behavior During Therapy: WFL for tasks assessed/performed Overall Cognitive Status: Within Functional Limits for tasks assessed                                        General Comments      Exercises     Assessment/Plan    PT Assessment Patient needs continued PT services  PT Problem List Decreased strength;Decreased activity tolerance;Decreased balance;Decreased mobility;Decreased knowledge of use of DME;Cardiopulmonary status limiting activity       PT Treatment Interventions DME instruction;Stair training;Gait training;Functional mobility training;Therapeutic activities;Therapeutic exercise;Balance training;Patient/family education    PT Goals (Current goals can be found in the Care Plan section)  Acute Rehab PT Goals Patient Stated Goal: to go home when her breathing is better PT Goal Formulation: With patient Time For Goal Achievement: 05/18/21 Potential to Achieve Goals: Good    Frequency Min 3X/week   Barriers to discharge        Co-evaluation               AM-PAC PT "6 Clicks" Mobility  Outcome Measure Help needed turning from your back to your side while in a flat bed without using bedrails?: A Little Help needed moving from lying on your back to sitting on the side of a flat bed without using bedrails?: A Little Help needed moving to and from a bed to a chair (including a wheelchair)?: A Little Help needed standing up from a chair using your arms (e.g., wheelchair or bedside chair)?: A Little Help needed to walk in hospital room?: A Lot Help needed climbing 3-5 steps with a railing? : A Lot 6 Click Score: 16    End of Session  Equipment Utilized During Treatment: Oxygen (4L O2 [place]) Activity Tolerance: Patient limited by lethargy;Other (comment) (limited by increased WOB/DOE) Patient left: in bed;with call bell/phone within reach;with family/visitor present   PT Visit Diagnosis: Muscle weakness (generalized) (M62.81);Difficulty in walking, not elsewhere classified (R26.2)    Time: 8453-6468 PT Time Calculation (min) (ACUTE ONLY): 45 min   Charges:   PT Evaluation $PT Eval Moderate Complexity: 1 Mod PT Treatments $Therapeutic Activity: 23-37 mins        Verdene Lennert, PT, DPT  Acute Rehabilitation Ortho Tech Supervisor [phone] pager [phone]) [phone] office

## 2021-05-04 NOTE — Progress Notes (Addendum)
PROGRESS NOTE    Linda Stevens  BHA:[id number] DOB: 1945/10/30 DOA: 05/03/2021 PCP: [name], MD   Chief Complain: Shortness of breath  Brief Narrative: Patient is a 75 year old female with history of severe pulmonary hypertension, chronic diastolic congestive heart failure, COPD Gold stage III, chronic hypoxic respiratory failure on 4 L of oxygen, CKD stage IIIb who presented with shortness of breath for a week.  She also reported cough, exertional dyspnea.  She follows with cardiology for history of severe pulmonary hypertension, diastolic congestive heart failure.  Takes Lasix at home.  On presentation she was borderline hypotensive so given 500 mL of bolus of  IV fluid but blood pressure remained low.  Chest x-ray showed bilateral pleural effusion.  CHF team  consulted and following.  Assessment & Plan:   Active Problems:   Chronic diastolic CHF (congestive heart failure) (HCC)   CHF (congestive heart failure) (HCC)   Acute on chronic diastolic congestive heart failure: Presented with symptoms of cough, dyspnea on exertion, hypotension.  CHF team following.  Chest imaging showed bilateral pleural effusion.  Elevated JVP.  Echo done during this hospitalization showed EF of 60 to 65%, no pericardial effusion.. Chest CT showed significant pulmonary congestion, pleural effusion, small pericardial effusion.  Given a dose of 60 mg Lasix IV once  Acute COPD exacerbation: Possibility of concurrent COPD exacerbation.  Started on DuoNeb,prednisone  Acute on chronic hypoxic respiratory failure: Secondary to CHF/COPD: On 4 L of oxygen per minute at home,now at baseline  Hypertension: Continue current medications.  Monitor blood pressure  History of paroxysmal A. fib: Currently in normal sinus rhythm.  No anticoagulation due to history of GI bleed.  On amiodarone at home which is being planned to be discontinued due to pulmonary findings  History of severe pulmonary hypertension with  signs of cor pulmonale: On Ambrisentan, tadafil.  Follows with cardiology.  CKD stage IIIb: Creatinine baseline is around 1.5.  Creatinine has trended up slightly to 1.9 today.  Continue to monitor  Liver cirrhosis: As seen on CT.  Could be secondary to right heart failure.  Hepatitis serologies negative.  Hypothyroidism: Recently stopped taking Synthyroid,saying she cudnt tolerate it.  TSH is elevated at 8.9.  I recommended to be started on levothyroxine 50 mcg daily, but patient and her husband strictly denying to take it  Chronic normocytic anemia: Currently hemoglobin stable.  Has mild thrombocytopenia.  Debility/deconditioning: We will have requested for PT/OT evaluation         DVT prophylaxis:heparin Davidson Code Status: DNR Family Communication: Husband at bedside Status is: Inpatient  Remains inpatient appropriate because:Inpatient level of care appropriate due to severity of illness  Dispo: The patient is from: Home              Anticipated d/c is to: Home              Patient currently is not medically stable to d/c.   Difficult to place patient No     Consultants: Cardiology  Procedures:None  Antimicrobials:  Anti-infectives (From admission, onward)    None       Subjective: Patient seen and examined at the bedside this morning.  Blood pressure was soft when I evaluated her.  Her heart rate is well controlled.  Denies any worsening shortness of breath or chest pain.  Lying in the bed.  Husband at bedside.  Looks chronically ill looking, weak, on 4 L of oxygen per minute which is her baseline.  Objective: Vitals:  05/04/21 0241 05/04/21 0339 05/04/21 0716 05/04/21 0814  BP:  (!) 111/43 (!) 94/40   Pulse:  72 67   Resp:  18 18   Temp:  97.6 F (36.4 C) 97.9 F (36.6 C)   TempSrc:  Oral Oral   SpO2:  100% 100% 97%  Weight: 61.3 kg     Height:        Intake/Output Summary (Last 24 hours) at 05/04/2021 0829 Last data filed at 05/04/2021 0241 Gross per  24 hour  Intake 793.88 ml  Output 650 ml  Net 143.88 ml   Filed Weights   05/03/21 1001 05/03/21 1600 05/04/21 0241  Weight: 56.7 kg 60.5 kg 61.3 kg    Examination:  General exam: Very deconditioned, chronically ill looking, weak  HEENT: PERRL Respiratory system: Diminished air sounds bilaterally, no wheezes or crackles Cardiovascular system: S1 & S2 heard, RRR.  Gastrointestinal system: Abdomen is nondistended, soft and nontender. Central nervous system: Alert and oriented Extremities: No edema, no clubbing ,no cyanosis Skin: No rashes, no ulcers,no icterus    Data Reviewed: I have personally reviewed following labs and imaging studies  CBC: Recent Labs  Lab 05/03/21 1008 05/03/21 1417 05/04/21 0406  WBC 4.7  --  4.2  NEUTROABS 3.2  --   --   HGB 10.2* 9.5* 9.0*  HCT 34.7* 28.0* 29.4*  MCV 103.3*  --  101.4*  PLT 136*  --  562*   Basic Metabolic Panel: Recent Labs  Lab 05/03/21 1008 05/03/21 1417 05/04/21 0406  NA 141 141 138  K 4.7 4.7 5.1  CL 102  --  102  CO2 35*  --  29  GLUCOSE 94  --  139*  BUN 24*  --  33*  CREATININE 1.51*  --  1.91*  CALCIUM 8.5*  --  8.0*   GFR: Estimated Creatinine Clearance: 21.7 mL/min (A) (by C-G formula based on SCr of 1.91 mg/dL (H)). Liver Function Tests: Recent Labs  Lab 05/03/21 1330  AST 18  ALT 7  ALKPHOS 48  BILITOT 0.5  PROT 6.4*  ALBUMIN 2.6*   No results for input(s): LIPASE, AMYLASE in the last 168 hours. No results for input(s): AMMONIA in the last 168 hours. Coagulation Profile: No results for input(s): INR, PROTIME in the last 168 hours. Cardiac Enzymes: No results for input(s): CKTOTAL, CKMB, CKMBINDEX, TROPONINI in the last 168 hours. BNP (last 3 results) No results for input(s): PROBNP in the last 8760 hours. HbA1C: No results for input(s): HGBA1C in the last 72 hours. CBG: No results for input(s): GLUCAP in the last 168 hours. Lipid Profile: No results for input(s): CHOL, HDL, LDLCALC,  TRIG, CHOLHDL, LDLDIRECT in the last 72 hours. Thyroid Function Tests: Recent Labs    05/03/21 1429  TSH 8.955*   Anemia Panel: No results for input(s): VITAMINB12, FOLATE, FERRITIN, TIBC, IRON, RETICCTPCT in the last 72 hours. Sepsis Labs: No results for input(s): PROCALCITON, LATICACIDVEN in the last 168 hours.  Recent Results (from the past 240 hour(s))  Resp Panel by RT-PCR (Flu A&B, Covid) Nasopharyngeal Swab     Status: None   Collection Time: 05/03/21 10:01 AM   Specimen: Nasopharyngeal Swab; Nasopharyngeal(NP) swabs in vial transport medium  Result Value Ref Range Status   SARS Coronavirus 2 by RT PCR NEGATIVE NEGATIVE Final    Comment: (NOTE) SARS-CoV-2 target nucleic acids are NOT DETECTED.  The SARS-CoV-2 RNA is generally detectable in upper respiratory specimens during the acute phase of infection. The lowest concentration  of SARS-CoV-2 viral copies this assay can detect is 138 copies/mL. A negative result does not preclude SARS-Cov-2 infection and should not be used as the sole basis for treatment or other patient management decisions. A negative result may occur with  improper specimen collection/handling, submission of specimen other than nasopharyngeal swab, presence of viral mutation(s) within the areas targeted by this assay, and inadequate number of viral copies(<138 copies/mL). A negative result must be combined with clinical observations, patient history, and epidemiological information. The expected result is Negative.  Fact Sheet for Patients:  EntrepreneurPulse.com.au  Fact Sheet for Healthcare Providers:  IncredibleEmployment.be  This test is no t yet approved or cleared by the [place] FDA and  has been authorized for detection and/or diagnosis of SARS-CoV-2 by FDA under an Emergency Use Authorization (EUA). This EUA will remain  in effect (meaning this test can be used) for the duration of the COVID-19  declaration under Section 564(b)(1) of the Act, 21 U.S.C.section 360bbb-3(b)(1), unless the authorization is terminated  or revoked sooner.       Influenza A by PCR NEGATIVE NEGATIVE Final   Influenza B by PCR NEGATIVE NEGATIVE Final    Comment: (NOTE) The Xpert Xpress SARS-CoV-2/FLU/RSV plus assay is intended as an aid in the diagnosis of influenza from Nasopharyngeal swab specimens and should not be used as a sole basis for treatment. Nasal washings and aspirates are unacceptable for Xpert Xpress SARS-CoV-2/FLU/RSV testing.  Fact Sheet for Patients: EntrepreneurPulse.com.au  Fact Sheet for Healthcare Providers: IncredibleEmployment.be  This test is not yet approved or cleared by the [place] FDA and has been authorized for detection and/or diagnosis of SARS-CoV-2 by FDA under an Emergency Use Authorization (EUA). This EUA will remain in effect (meaning this test can be used) for the duration of the COVID-19 declaration under Section 564(b)(1) of the Act, 21 U.S.C. section 360bbb-3(b)(1), unless the authorization is terminated or revoked.  Performed at [facility], [place] [address], [place], [place] [address]   Group A Strep by PCR     Status: None   Collection Time: 05/03/21 10:02 AM   Specimen: Nasopharyngeal Swab; Sterile Swab  Result Value Ref Range Status   Group A Strep by PCR NOT DETECTED NOT DETECTED Final    Comment: Performed at [facility], [place] [address] [place], [place] [address]         Radiology Studies: CT CHEST WO CONTRAST  Result Date: 05/03/2021 CLINICAL DATA:  Abnormal chest radiographs, pleural effusions EXAM: CT CHEST WITHOUT CONTRAST TECHNIQUE: Multidetector CT imaging of the chest was performed following the standard protocol without IV contrast. COMPARISON:  Same-day chest radiographs, CT chest angiogram, 02/19/2020 FINDINGS: Cardiovascular: Aortic atherosclerosis. Cardiomegaly. Three-vessel  coronary artery calcifications. Gross enlargement of the main pulmonary artery measuring up to 4.0 cm in caliber. Small pericardial effusion, diminished compared to prior examination. Mediastinum/Nodes: Unchanged prominent mediastinal and hilar lymph nodes. Thyroid gland, trachea, and esophagus demonstrate no significant findings. Lungs/Pleura: Moderate centrilobular emphysema. There are numerous scattered, somewhat ill-defined ground-glass and heterogeneous airspace opacities throughout the lungs, most concentrated in the left upper lobe (series 4, image 61, 76, 53). Unchanged small, loculated bilateral pleural effusions associated atelectasis or consolidation. Upper Abdomen: No acute abnormality. Coarse, nodular contour of the liver. Musculoskeletal: No chest wall mass or suspicious bone lesions identified. IMPRESSION: 1. There are numerous scattered, somewhat ill-defined ground-glass and heterogeneous airspace opacities throughout the lungs, most concentrated in the left upper lobe, new compared to prior examination. Findings are  most consistent with multifocal infection. 2. Unchanged small, loculated bilateral pleural effusions and associated atelectasis or consolidation. 3. Moderate centrilobular emphysema. 4. Cardiomegaly with small pericardial effusion, diminished compared to prior examination. 5. Gross enlargement of the main pulmonary artery, as can be seen with pulmonary hypertension. 6. Coronary artery disease. 7. Coarse, nodular contour of the liver in the included upper abdomen, suggestive of cirrhosis. Aortic Atherosclerosis (ICD10-I70.0) and Emphysema (ICD10-J43.9). Electronically Signed   By: Eddie Candle M.D.   On: 05/03/2021 16:06   DG Chest Port 1 View  Result Date: 05/03/2021 CLINICAL DATA:  75 year old female with history of worsening shortness of breath over the past week. Low blood pressure. EXAM: PORTABLE CHEST 1 VIEW COMPARISON:  Chest x-ray 06/05/2020. FINDINGS: Extensive ill-defined  opacities and areas of interstitial prominence throughout the mid to lower lungs bilaterally, most severe in the lung bases. Small bilateral pleural effusions (right greater than left). Mild cephalization of the pulmonary vasculature. No pneumothorax. Mild cardiomegaly. Upper mediastinal contours are within normal limits. Atherosclerotic calcifications in the thoracic aorta. IMPRESSION: 1. The appearance of the chest suggests congestive heart failure, as above. 2. Bibasilar opacities likely reflect areas of subsegmental atelectasis, however, underlying airspace consolidation is difficult to entirely exclude. 3. Aortic atherosclerosis. Electronically Signed   By: Vinnie Langton M.D.   On: 05/03/2021 10:50        Scheduled Meds:  ambrisentan  10 mg Oral Daily   amiodarone  100 mg Oral Daily   aspirin EC  81 mg Oral Daily   budesonide (PULMICORT) nebulizer solution  0.5 mg Nebulization BID   ferrous sulfate  325 mg Oral BID WC   fluticasone furoate-vilanterol  1 puff Inhalation Daily   guaiFENesin  1,200 mg Oral BID   heparin  5,000 Units Subcutaneous Q12H   ipratropium-albuterol  3 mL Nebulization TID   ketoconazole  1 application Topical Daily   nystatin  5 mL Oral QID   pantoprazole  40 mg Oral Daily   potassium chloride SA  20 mEq Oral Daily   predniSONE  40 mg Oral Q breakfast   spironolactone  12.5 mg Oral Daily   tadalafil  40 mg Oral Daily   Continuous Infusions:   LOS: 1 day    Time spent:35 mins, More than 50% of that time was spent in counseling and/or coordination of care.      Shelly Coss, MD [facility] P8/27/2022, 8:29 AM

## 2021-05-04 NOTE — Progress Notes (Signed)
  Echocardiogram 2D Echocardiogram has been performed.  Linda Stevens 05/04/2021, 10:54 AM

## 2021-05-05 DIAGNOSIS — I5032 Chronic diastolic (congestive) heart failure: Secondary | ICD-10-CM | POA: Diagnosis not present

## 2021-05-05 DIAGNOSIS — I313 Pericardial effusion (noninflammatory): Secondary | ICD-10-CM | POA: Diagnosis not present

## 2021-05-05 DIAGNOSIS — I509 Heart failure, unspecified: Secondary | ICD-10-CM

## 2021-05-05 DIAGNOSIS — J9621 Acute and chronic respiratory failure with hypoxia: Secondary | ICD-10-CM | POA: Diagnosis not present

## 2021-05-05 LAB — URINALYSIS, ROUTINE W REFLEX MICROSCOPIC
Bilirubin Urine: NEGATIVE
Glucose, UA: NEGATIVE mg/dL
Hgb urine dipstick: NEGATIVE
Ketones, ur: NEGATIVE mg/dL
Nitrite: NEGATIVE
Protein, ur: NEGATIVE mg/dL
Specific Gravity, Urine: 1.021 (ref 1.005–1.030)
pH: 5 (ref 5.0–8.0)

## 2021-05-05 LAB — CBC
HCT: 28 % — ABNORMAL LOW (ref 36.0–46.0)
Hemoglobin: 8.6 g/dL — ABNORMAL LOW (ref 12.0–15.0)
MCH: 30.7 pg (ref 26.0–34.0)
MCHC: 30.7 g/dL (ref 30.0–36.0)
MCV: 100 fL (ref 80.0–100.0)
Platelets: 146 10*3/uL — ABNORMAL LOW (ref 150–400)
RBC: 2.8 MIL/uL — ABNORMAL LOW (ref 3.87–5.11)
RDW: 17.5 % — ABNORMAL HIGH (ref 11.5–15.5)
WBC: 7.5 10*3/uL (ref 4.0–10.5)
nRBC: 0.3 % — ABNORMAL HIGH (ref 0.0–0.2)

## 2021-05-05 LAB — BASIC METABOLIC PANEL
Anion gap: 8 (ref 5–15)
BUN: 45 mg/dL — ABNORMAL HIGH (ref 8–23)
CO2: 29 mmol/L (ref 22–32)
Calcium: 8.4 mg/dL — ABNORMAL LOW (ref 8.9–10.3)
Chloride: 102 mmol/L (ref 98–111)
Creatinine, Ser: 1.93 mg/dL — ABNORMAL HIGH (ref 0.44–1.00)
GFR, Estimated: 27 mL/min — ABNORMAL LOW (ref >60)
Glucose, Bld: 117 mg/dL — ABNORMAL HIGH (ref 70–99)
Potassium: 4.9 mmol/L (ref 3.5–5.1)
Sodium: 139 mmol/L (ref 135–145)

## 2021-05-05 MED ORDER — HYDROXYZINE HCL 25 MG PO TABS
50.0000 mg | ORAL_TABLET | Freq: Every evening | ORAL | Status: DC | PRN
  Administered 2021-05-05 – 2021-05-06 (×2): 50 mg via ORAL
  Filled 2021-05-05 (×2): qty 2

## 2021-05-05 MED ORDER — HYDROXYZINE HCL 25 MG PO TABS: 25.0000 mg | ORAL_TABLET | Freq: Every evening | ORAL | Status: DC | PRN

## 2021-05-05 MED ORDER — LORAZEPAM 0.5 MG PO TABS
0.5000 mg | ORAL_TABLET | Freq: Once | ORAL | Status: AC | PRN
  Administered 2021-05-05: 0.5 mg via ORAL
  Filled 2021-05-05: qty 1

## 2021-05-05 MED ORDER — AZITHROMYCIN 250 MG PO TABS
500.0000 mg | ORAL_TABLET | Freq: Every day | ORAL | Status: DC
  Administered 2021-05-05 – 2021-05-08 (×4): 500 mg via ORAL
  Filled 2021-05-05 (×5): qty 2

## 2021-05-05 MED ORDER — ACETAMINOPHEN 325 MG PO TABS
650.0000 mg | ORAL_TABLET | Freq: Four times a day (QID) | ORAL | Status: DC | PRN
  Administered 2021-05-05 – 2021-05-06 (×3): 650 mg via ORAL
  Filled 2021-05-05 (×3): qty 2

## 2021-05-05 MED ORDER — SODIUM CHLORIDE 0.9 % IV SOLN
2.0000 g | INTRAVENOUS | Status: DC
  Administered 2021-05-05 – 2021-05-08 (×4): 2 g via INTRAVENOUS
  Filled 2021-05-05 (×4): qty 20

## 2021-05-05 NOTE — Progress Notes (Signed)
Advanced Heart Failure Rounding Note   Subjective:    Says breathing feels better. But doesn't feel well overall. Mild cough. No orthopnea or PND. Chest sore from echocardiogram study.   Objective:   Weight Range:  Vital Signs:   Temp:  [97.7 F (36.5 C)-98.2 F (36.8 C)] 98.2 F (36.8 C) (08/28 0000) Pulse Rate:  [71-75] 71 (08/28 0400) Resp:  [16-20] 16 (08/28 0400) BP: (88-128)/(41-46) 97/43 (08/28 0400) SpO2:  [90 %-98 %] 95 % (08/28 0754) Weight:  [61.8 kg] 61.8 kg (08/28 0500) Last BM Date: 05/04/21  Weight change: Filed Weights   05/03/21 1600 05/04/21 0241 05/05/21 0500  Weight: 60.5 kg 61.3 kg 61.8 kg    Intake/Output:   Intake/Output Summary (Last 24 hours) at 05/05/2021 1138 Last data filed at 05/05/2021 0824 Gross per 24 hour  Intake 580 ml  Output 500 ml  Net 80 ml      Physical Exam: General:  Sitting up in bed No resp difficulty HEENT: normal  + telengectasias  Neck: supple. JVP 6-7 prominent v-waves Carotids 2+ bilat; no bruits. No lymphadenopathy or thryomegaly appreciated. Cor: PMI nondisplaced. Regular rate & rhythm. No rubs, gallops or murmurs. Lungs: decreased throughtout Abdomen: soft, nontender, nondistended. No hepatosplenomegaly. No bruits or masses. Good bowel sounds. Extremities: no cyanosis, clubbing, rash, edema Neuro: alert & orientedx3, cranial nerves grossly intact. moves all 4 extremities w/o difficulty. Affect pleasant  Telemetry: SR 70-80s Personally reviewed   Labs: Basic Metabolic Panel: Recent Labs  Lab 05/03/21 1008 05/03/21 1417 05/04/21 0406 05/05/21 0412  NA 141 141 138 139  K 4.7 4.7 5.1 4.9  CL 102  --  102 102  CO2 35*  --  29 29  GLUCOSE 94  --  139* 117*  BUN 24*  --  33* 45*  CREATININE 1.51*  --  1.91* 1.93*  CALCIUM 8.5*  --  8.0* 8.4*     Liver Function Tests: Recent Labs  Lab 05/03/21 1330  AST 18  ALT 7  ALKPHOS 48  BILITOT 0.5  PROT 6.4*  ALBUMIN 2.6*    No results for  input(s): LIPASE, AMYLASE in the last 168 hours. No results for input(s): AMMONIA in the last 168 hours.  CBC: Recent Labs  Lab 05/03/21 1008 05/03/21 1417 05/04/21 0406 05/05/21 0412  WBC 4.7  --  4.2 7.5  NEUTROABS 3.2  --   --   --   HGB 10.2* 9.5* 9.0* 8.6*  HCT 34.7* 28.0* 29.4* 28.0*  MCV 103.3*  --  101.4* 100.0  PLT 136*  --  141* 146*     Cardiac Enzymes: No results for input(s): CKTOTAL, CKMB, CKMBINDEX, TROPONINI in the last 168 hours.  BNP: BNP (last 3 results) Recent Labs    07/02/20 1441 11/02/20 1413 05/03/21 1105  BNP 344.5* 448.2* 358.4*     ProBNP (last 3 results) No results for input(s): PROBNP in the last 8760 hours.    Other results:  Imaging: CT CHEST WO CONTRAST  Result Date: 05/03/2021 CLINICAL DATA:  Abnormal chest radiographs, pleural effusions EXAM: CT CHEST WITHOUT CONTRAST TECHNIQUE: Multidetector CT imaging of the chest was performed following the standard protocol without IV contrast. COMPARISON:  Same-day chest radiographs, CT chest angiogram, 02/19/2020 FINDINGS: Cardiovascular: Aortic atherosclerosis. Cardiomegaly. Three-vessel coronary artery calcifications. Gross enlargement of the main pulmonary artery measuring up to 4.0 cm in caliber. Small pericardial effusion, diminished compared to prior examination. Mediastinum/Nodes: Unchanged prominent mediastinal and hilar lymph nodes. Thyroid gland, trachea,  and esophagus demonstrate no significant findings. Lungs/Pleura: Moderate centrilobular emphysema. There are numerous scattered, somewhat ill-defined ground-glass and heterogeneous airspace opacities throughout the lungs, most concentrated in the left upper lobe (series 4, image 61, 76, 53). Unchanged small, loculated bilateral pleural effusions associated atelectasis or consolidation. Upper Abdomen: No acute abnormality. Coarse, nodular contour of the liver. Musculoskeletal: No chest wall mass or suspicious bone lesions identified.  IMPRESSION: 1. There are numerous scattered, somewhat ill-defined ground-glass and heterogeneous airspace opacities throughout the lungs, most concentrated in the left upper lobe, new compared to prior examination. Findings are most consistent with multifocal infection. 2. Unchanged small, loculated bilateral pleural effusions and associated atelectasis or consolidation. 3. Moderate centrilobular emphysema. 4. Cardiomegaly with small pericardial effusion, diminished compared to prior examination. 5. Gross enlargement of the main pulmonary artery, as can be seen with pulmonary hypertension. 6. Coronary artery disease. 7. Coarse, nodular contour of the liver in the included upper abdomen, suggestive of cirrhosis. Aortic Atherosclerosis (ICD10-I70.0) and Emphysema (ICD10-J43.9). Electronically Signed   By: Eddie Candle M.D.   On: 05/03/2021 16:06   ECHOCARDIOGRAM COMPLETE  Result Date: 05/04/2021    ECHOCARDIOGRAM REPORT   Patient Name:   Linda Stevens Date of Exam: 05/04/2021 Medical Rec #:  [id number]           Height:       63.0 in Accession #:    [id number]          Weight:       135.1 lb Date of Birth:  November 12, 1945           BSA:          1.637 m Patient Age:    75 years            BP:           94/40 mmHg Patient Gender: F                   HR:           77 bpm. Exam Location:  Inpatient Procedure: 2D Echo, Cardiac Doppler and Color Doppler Indications:    CHF-Acute Diastolic H21.97  History:        Patient has prior history of Echocardiogram examinations, most                 recent 07/13/2020. CHF, COPD and Pulmonary HTN; Risk                 Factors:Former Smoker and Hypertension.  Sonographer:    Bernadene Person RDCS Referring Phys: [id number] [place]  1. Left ventricular ejection fraction, by estimation, is 60 to 65%. The left ventricle has normal function. The left ventricle has no regional wall motion abnormalities. Left ventricular diastolic parameters are indeterminate.  2. Right  ventricular systolic function is normal. The right ventricular size is normal. Tricuspid regurgitation signal is inadequate for assessing PA pressure.  3. The mitral valve is normal in structure. No evidence of mitral valve regurgitation. No evidence of mitral stenosis.  4. The aortic valve has an indeterminant number of cusps. Aortic valve regurgitation is not visualized. No aortic stenosis is present.  5. The inferior vena cava is dilated in size with >50% respiratory variability, suggesting right atrial pressure of 8 mmHg. FINDINGS  Left Ventricle: Left ventricular ejection fraction, by estimation, is 60 to 65%. The left ventricle has normal function. The left ventricle has no regional wall motion abnormalities. The left ventricular internal cavity  size was normal in size. There is  no left ventricular hypertrophy. Left ventricular diastolic parameters are indeterminate. Right Ventricle: The right ventricular size is normal. No increase in right ventricular wall thickness. Right ventricular systolic function is normal. Tricuspid regurgitation signal is inadequate for assessing PA pressure. Left Atrium: Left atrial size was normal in size. Right Atrium: Right atrial size was normal in size. Pericardium: There is no evidence of pericardial effusion. Mitral Valve: The mitral valve is normal in structure. No evidence of mitral valve regurgitation. No evidence of mitral valve stenosis. Tricuspid Valve: The tricuspid valve is not well visualized. Tricuspid valve regurgitation is trivial. No evidence of tricuspid stenosis. Aortic Valve: The aortic valve has an indeterminant number of cusps. Aortic valve regurgitation is not visualized. No aortic stenosis is present. Pulmonic Valve: The pulmonic valve was not well visualized. Pulmonic valve regurgitation is not visualized. No evidence of pulmonic stenosis. Aorta: The aortic root is normal in size and structure. Venous: The inferior vena cava is dilated in size with  greater than 50% respiratory variability, suggesting right atrial pressure of 8 mmHg. IAS/Shunts: No atrial level shunt detected by color flow Doppler.  LEFT VENTRICLE PLAX 2D LVIDd:         4.00 cm  Diastology LVIDs:         2.40 cm  LV e' medial:    6.53 cm/s LV PW:         1.10 cm  LV E/e' medial:  19.3 LV IVS:        0.90 cm  LV e' lateral:   6.87 cm/s LVOT diam:     1.80 cm  LV E/e' lateral: 18.3 LV SV:         69 LV SV Index:   42 LVOT Area:     2.54 cm  RIGHT VENTRICLE RV S prime:     11.00 cm/s TAPSE (M-mode): 1.8 cm LEFT ATRIUM           Index       RIGHT ATRIUM           Index LA diam:      3.20 cm 1.96 cm/m  RA Area:     12.10 cm LA Vol (A2C): 32.5 ml 19.86 ml/m RA Volume:   21.40 ml  13.08 ml/m LA Vol (A4C): 44.0 ml 26.88 ml/m  AORTIC VALVE LVOT Vmax:   113.00 cm/s LVOT Vmean:  75.600 cm/s LVOT VTI:    0.273 m  AORTA Ao Root diam: 2.70 cm Ao Asc diam:  2.80 cm MITRAL VALVE MV Area (PHT): 2.42 cm     SHUNTS MV Decel Time: 313 msec     Systemic VTI:  0.27 m MV E velocity: 126.00 cm/s  Systemic Diam: 1.80 cm MV A velocity: 95.60 cm/s MV E/A ratio:  1.32 Carlyle Dolly MD Electronically signed by Carlyle Dolly MD Signature Date/Time: 05/04/2021/11:04:39 AM    Final      Medications:     Scheduled Medications:  ambrisentan  10 mg Oral Daily   amiodarone  100 mg Oral Daily   aspirin EC  81 mg Oral Daily   azithromycin  500 mg Oral Daily   budesonide (PULMICORT) nebulizer solution  0.5 mg Nebulization BID   ferrous sulfate  325 mg Oral BID WC   fluticasone furoate-vilanterol  1 puff Inhalation Daily   guaiFENesin  1,200 mg Oral BID   heparin  5,000 Units Subcutaneous Q12H   ipratropium-albuterol  3 mL Nebulization TID  ketoconazole  1 application Topical Daily   nystatin  5 mL Oral QID   pantoprazole  40 mg Oral Daily   potassium chloride SA  20 mEq Oral Daily   predniSONE  40 mg Oral Q breakfast   spironolactone  12.5 mg Oral Daily   tadalafil  40 mg Oral Daily     Infusions:  cefTRIAXone (ROCEPHIN)  IV      PRN Medications: acetaminophen, albuterol, melatonin   Assessment/Plan:   1.  A/C respiratory failure with hypoxia: - On 4L 02 at home, requiring 6L on initial presentation. Down to 4L at time of evaluation. - Suspect primarily d/t COPD exacerbation. Continues to improve with treatment of COPD flare - CT chest notable for emphysema. No acute process - Management per TRH   2. Chronic diastolic HF with RV failure: - Repeat RHC 12/18 with mild PAH (mean PA 33) - Echo 5/18 with complete resolution of RV strain. No significant TR to estimate RVSP - Echo 4/19 with EF 65% no evidence of RV strain  - echo  12/09/19 EF 70-75% RV normal  - Echo 11/21 EF 75% RV normal no evidence PAH - Resolution of PAH on recent echos - Auto-immune panel previously negative. Suspicion for CTD. - Admitted with worsening dyspnea and hypoxia. Small pleural effusions noted on chest x-ray. Weight actually down 7 lb from last visit in February.ReDS 18%. BNP ordered. - Got 1 dose IV lasix with minimal response  - Echo 05/04/21 EF 60-65% RV ok. No significant effusion    3. PAH - suspect combination of WHO Group I and III PAH - On Letairis and Tadalafil. Previously discussed adding Selexipag but pressures down on last cath/echo   4. Recurrent R pleural effusion -  Suspect possible component of hepatic hydrothorax. Now controlled with with diuresis - Small bilateral effusions noted on CXR/CT this admit - no changed   5.  Pericardial effusion: - small effusion on CT yesterday (improved)   6.  PAF: - NSR today.  - On amio 100 mg daily.Can stop once COPD flare resolved - Eliquis stopped due to severe LGIB 9/21 (hgb 3.1). Will not restart with AVMs   7. Cirrhosis - due to RHF. Hepatitis serologies are negative. Possibly due to right heart failure - given EF > 75% may need to consider propanolol +/- midodrine to avoid high-output HF at some point  8. AKI - SCr  1.4 -> 1.9. Likely overdiuresis. Will hold diuretics - check UA to make sure not component of nephritic syndrome in setting of possible CTD   Continues to improve with current therapy .   Length of Stay: 2   [name] MD 05/05/2021, 11:38 AM  Advanced Heart Failure Team Pager [phone] (M-F; 7a - 4p)  Please contact [facility] for night-coverage after hours (4p -7a ) and weekends on [email or web address]

## 2021-05-05 NOTE — Evaluation (Signed)
Occupational Therapy Evaluation Patient Details Name: Linda Stevens MRN: [id number] DOB: 1945-09-11 Today's Date: 05/05/2021    History of Present Illness 75 y.o. female admitted on 05/03/21 for cough, SOB.  Pt dx with acute on chronic diastolic CHF, acute COPD exacerbation, acute on chronic hypoxic respiratory failure.  Pt with significant PMH of asthma, emphysema, HTN, pericardial effusion, pulmonary HTN, PAF, CKD, liver cirrhosis, hypothyroid, chronic normocytic anemia.   Clinical Impression   PTA patient independent with ADLs, mobility and limited IADLs.  Admitted for above and presenting with problem list below, including generalized weakness, decreased activity tolerance and endurance.  Pt currently requires min assist for bed mobility, min guard for transfers to recliner, and up to min guard for ADLs.  She is on 4L supplemental O2 during session via [place] (this is her baseline), BP soft but stable (90s/40s). Pt will benefit from further OT services while admitted and after dc at [facility] level to optimize independence, safety and tolerance to ADLs.     Follow Up Recommendations  Home health OT;Supervision/Assistance - 24 hour    Equipment Recommendations  None recommended by OT    Recommendations for Other Services       Precautions / Restrictions Precautions Precautions: Fall;Other (comment) Precaution Comments: watch O2 and BP Restrictions Weight Bearing Restrictions: No      Mobility Bed Mobility Overal bed mobility: Needs Assistance Bed Mobility: Supine to Sit     Supine to sit: Min assist     General bed mobility comments: min assist to elevated trunk, increased time required    Transfers Overall transfer level: Needs assistance Equipment used: Rolling walker (2 wheeled) Transfers: Sit to/from [facility] Sit to Stand: Min guard Stand pivot transfers: Min guard       General transfer comment: min guard to steady using RW to recliner.     Balance Overall balance assessment: Needs assistance Sitting-balance support: No upper extremity supported;Feet supported Sitting balance-Leahy Scale: Fair     Standing balance support: Bilateral upper extremity supported;During functional activity Standing balance-Leahy Scale: Poor Standing balance comment: min guard for safety with BUE support on RW                           ADL either performed or assessed with clinical judgement   ADL Overall ADL's : Needs assistance/impaired     Grooming: Set up;Sitting           Upper Body Dressing : Set up;Sitting   Lower Body Dressing: Min guard;Sit to/from stand   Toilet Transfer: Min guard;Stand-pivot;RW           Functional mobility during ADLs: Min guard;Rolling walker General ADL Comments: pt limited by decreased activity tolerance and endurance     Vision   Vision Assessment?: No apparent visual deficits     Perception     Praxis      Pertinent Vitals/Pain Pain Assessment: No/denies pain     Hand Dominance Right   Extremity/Trunk Assessment Upper Extremity Assessment Upper Extremity Assessment: Generalized weakness   Lower Extremity Assessment Lower Extremity Assessment: Defer to PT evaluation   Cervical / Trunk Assessment Cervical / Trunk Assessment: Normal   Communication Communication Communication: No difficulties   Cognition Arousal/Alertness: Awake/alert Behavior During Therapy: WFL for tasks assessed/performed Overall Cognitive Status: Within Functional Limits for tasks assessed  General Comments  pt on 4L during session with SpO2 >92% at rest, desaturating to 83% with transfer to recliner with 3-4 minute recovery required; BP soft but stable    Exercises     Shoulder Instructions      Home Living Family/patient expects to be discharged to:: Private residence Living Arrangements: Spouse/significant other Available Help at  Discharge: Family;Available PRN/intermittently Type of Home: House Home Access: Stairs to enter     Home Layout: One level     Bathroom Shower/Tub: Teacher, early years/pre: Standard     Home Equipment: Environmental consultant - 2 wheels;Shower seat   Additional Comments: uses 4 L O2 at baseline at home, spouse works part time and is not available 24/7      Prior Functioning/Environment Level of Independence: Needs assistance  Gait / Transfers Assistance Needed: Independent with amb without assistive device ADL's / Homemaking Assistance Needed: Husband assist in/out of tub and does most of the cleaning and cooking, pt indepednent ADLs and can do light meal prep when spouse is at work            OT Problem List: Decreased strength;Decreased activity tolerance;Impaired balance (sitting and/or standing);Decreased knowledge of use of DME or AE;Cardiopulmonary status limiting activity      OT Treatment/Interventions: Self-care/ADL training;Energy conservation;DME and/or AE instruction;Therapeutic exercise;Therapeutic activities;Patient/family education;Balance training    OT Goals(Current goals can be found in the care plan section) Acute Rehab OT Goals Patient Stated Goal: to go home when her breathing is better OT Goal Formulation: With patient Time For Goal Achievement: 05/19/21 Potential to Achieve Goals: Good  OT Frequency: Min 2X/week   Barriers to D/C:            Co-evaluation              AM-PAC OT "6 Clicks" Daily Activity     Outcome Measure Help from another person eating meals?: None Help from another person taking care of personal grooming?: A Little Help from another person toileting, which includes using toliet, bedpan, or urinal?: A Little Help from another person bathing (including washing, rinsing, drying)?: A Little Help from another person to put on and taking off regular upper body clothing?: A Little Help from another person to put on and taking off  regular lower body clothing?: A Little 6 Click Score: 19   End of Session Equipment Utilized During Treatment: Rolling walker;Oxygen (4L) Nurse Communication: Mobility status  Activity Tolerance: Patient tolerated treatment well Patient left: in chair;with call bell/phone within reach;with family/visitor present;with chair alarm set  OT Visit Diagnosis: Muscle weakness (generalized) (M62.81);Unsteadiness on feet (R26.81);Other (comment) (decreased activity tolerance)                Time: 2409-7353 OT Time Calculation (min): 21 min Charges:  OT General Charges $OT Visit: 1 Visit OT Evaluation $OT Eval Moderate Complexity: 1 Mod  Jolaine Artist, OT Acute Rehabilitation Services Pager [phone] Office [phone]   Delight Stare 05/05/2021, 10:36 AM

## 2021-05-05 NOTE — Progress Notes (Signed)
OT Note:   Pt requested to return back to bed, min guard for transfer to EOB using RW and min guard to return supine.  Remains limited by decreased activity tolerance, but SPO2 on 4L only desaturated to 88% and recovered much quicker than previous session.  BP soft (87/40 after transfer and 94/41 supine), RN aware and pt asymptomatic.  Will follow acutely.     05/05/21 1039  OT Visit Information  Last OT Received On 05/05/21  Assistance Needed +1  History of Present Illness 75 y.o. female admitted on 05/03/21 for cough, SOB.  Pt dx with acute on chronic diastolic CHF, acute COPD exacerbation, acute on chronic hypoxic respiratory failure.  Pt with significant PMH of asthma, emphysema, HTN, pericardial effusion, pulmonary HTN, PAF, CKD, liver cirrhosis, hypothyroid, chronic normocytic anemia.  Precautions  Precautions Fall;Other (comment)  Precaution Comments watch O2 and BP  Pain Assessment  Pain Assessment No/denies pain  Cognition  Arousal/Alertness Awake/alert  Behavior During Therapy WFL for tasks assessed/performed  Overall Cognitive Status Within Functional Limits for tasks assessed  ADL  Overall ADL's  Needs assistance/impaired  Lower Body Dressing Min guard;Sit to/from Environmental education officer guard;Stand-pivot;RW  Functional mobility during ADLs Min guard;Rolling walker  General ADL Comments pt limited by decreased activity tolerance and endurance  Bed Mobility  Overal bed mobility Needs Assistance  Bed Mobility Sit to Supine  Sit to supine Min guard  General bed mobility comments min guard for safety  Balance  Overall balance assessment Needs assistance  Sitting-balance support No upper extremity supported;Feet supported  Sitting balance-Leahy Scale Fair  Standing balance support Bilateral upper extremity supported;During functional activity  Standing balance-Leahy Scale Poor  Standing balance comment min guard for safety with BUE support on RW  Restrictions  Weight  Bearing Restrictions No  General Comments  General comments (skin integrity, edema, etc.) 4L during session, SpO2 desat to 88% with quick recovery (30 seconds) back to >92%. 97% upon exit. BP dropped to 87/40 after transfer and up to 94/41 at supine  OT - End of Session  Equipment Utilized During Treatment Rolling walker;Oxygen (4L)  Activity Tolerance Patient tolerated treatment well  Patient left with call bell/phone within reach;with family/visitor present;in bed;with bed alarm set  Nurse Communication Mobility status;Other (comment) (BP)  OT Assessment/Plan  OT Plan Discharge plan remains appropriate;Frequency remains appropriate  OT Visit Diagnosis Muscle weakness (generalized) (M62.81);Unsteadiness on feet (R26.81);Other (comment) (activity tolerance)  OT Frequency (ACUTE ONLY) Min 2X/week  Follow Up Recommendations Home health OT;Supervision/Assistance - 24 hour  OT Equipment None recommended by OT  AM-PAC OT "6 Clicks" Daily Activity Outcome Measure (Version 2)  Help from another person eating meals? 4  Help from another person taking care of personal grooming? 3  Help from another person toileting, which includes using toliet, bedpan, or urinal? 3  Help from another person bathing (including washing, rinsing, drying)? 3  Help from another person to put on and taking off regular upper body clothing? 3  Help from another person to put on and taking off regular lower body clothing? 3  6 Click Score 19  Progressive Mobility  Mobility Out of bed for toileting  Acute Rehab OT Goals  Patient Stated Goal to go home when her breathing is better  OT Goal Formulation With patient  OT Time Calculation  OT Start Time (ACUTE ONLY) 1021  OT Stop Time (ACUTE ONLY) 1030  OT Time Calculation (min) 9 min  OT General Charges  $OT Visit 1  Visit  OT Treatments  $Self Care/Home Management  8-22 mins   [place] Pager [phone] Office [phone]

## 2021-05-05 NOTE — Progress Notes (Addendum)
PROGRESS NOTE    Linda Stevens  KPT:[id number] DOB: 14-Jan-1946 DOA: 05/03/2021 PCP: [name], MD   Chief Complain: Shortness of breath  Brief Narrative: Patient is a 75 year old female with history of severe pulmonary hypertension, chronic diastolic congestive heart failure, COPD Gold stage III, chronic hypoxic respiratory failure on 4 L of oxygen, CKD stage IIIb who presented with shortness of breath for a week.  She also reported cough, exertional dyspnea.  She follows with cardiology for history of severe pulmonary hypertension, diastolic congestive heart failure.  Takes Lasix at home.  On presentation she was borderline hypotensive so given 500 mL of bolus of  IV fluid but blood pressure remained low.  Admitted for HF exaceration.  CHF team  consulted and following.  Assessment & Plan:   Active Problems:   Chronic diastolic CHF (congestive heart failure) (HCC)   CHF (congestive heart failure) (HCC)   Acute on chronic diastolic congestive heart failure: Presented with symptoms of cough, dyspnea on exertion, hypotension.  CHF team following.  Chest imaging showed bilateral pleural effusion.  Elevated JVP.  Echo done during this hospitalization showed EF of 60 to 65%, no pericardial effusion.. Chest CT showed pulmonary congestion, pleural effusion, small pericardial effusion.  Given a dose of 60 mg Lasix IV once,now on hold  Acute COPD exacerbation: Possibility of concurrent COPD exacerbation.  Started on DuoNeb,prednisone  Suspected multifocal pneumonia:CT chest showed numerous scattered, ill-defined ground-glass and heterogeneous airspace opacities throughout the lungs, consistent with multifocal pneumonia.  Started on ceftriaxone and azithromycin.  Patient does not have fever, no leukocytosis  Acute on chronic hypoxic respiratory failure: Secondary to CHF/COPD: On 4 L of oxygen per minute at home,now at baseline  Hypertension: Continue current medications.  Monitor blood  pressure  History of paroxysmal A. fib: .  No anticoagulation due to history of GI bleed.  On amiodarone at home which is being planned to be discontinued due to pulmonary findings  History of severe pulmonary hypertension with signs of cor pulmonale: On Ambrisentan, tadafil.  Follows with cardiology.  CKD stage IIIb: Creatinine baseline is around 1.5.  Creatinine has trended up slightly to 1.9 today.  Continue to monitor  Liver cirrhosis: As seen on CT.  Could be secondary to right heart failure.  Hepatitis serologies negative.  Hypothyroidism: Recently stopped taking Synthyroid,saying she cudnt tolerate it.  TSH is elevated at 8.9.  I recommended to be started on levothyroxine 50 mcg daily, but patient and her husband strictly denying to take it  Chronic normocytic anemia: Currently hemoglobin stable.  Has mild thrombocytopenia.  Debility/deconditioning: We will have requested for PT/OT evaluation, recommended home health on discharge         DVT prophylaxis:heparin [place] Code Status: DNR Family Communication: Husband at bedside on 05/04/21 Status is: Inpatient  Remains inpatient appropriate because:Inpatient level of care appropriate due to severity of illness  Dispo: The patient is from: Home              Anticipated d/c is to: Home              Patient currently is not medically stable to d/c.   Difficult to place patient No     Consultants: Cardiology  Procedures:None  Antimicrobials:  Anti-infectives (From admission, onward)    None       Subjective:  Patient seen and examined the bedside this morning.  She looks weak, lying in bed.  Also having some cough.  Still on 4 L of  oxygen per minute.  Objective: Vitals:   05/04/21 2002 05/05/21 0000 05/05/21 0400 05/05/21 0500  BP:  (!) 94/44 (!) 97/43   Pulse:  71 71   Resp:  20 16   Temp:  98.2 F (36.8 C)    TempSrc:  Oral    SpO2: 98% 96% 95%   Weight:    61.8 kg  Height:        Intake/Output Summary  (Last 24 hours) at 05/05/2021 0746 Last data filed at 05/05/2021 0500 Gross per 24 hour  Intake 820 ml  Output 700 ml  Net 120 ml   Filed Weights   05/03/21 1600 05/04/21 0241 05/05/21 0500  Weight: 60.5 kg 61.3 kg 61.8 kg    Examination:  General exam: Very deconditioned, weak, chronically ill looking HEENT: PERRL Respiratory system: Diminished air entry bilaterally, no wheezing heard today. Cardiovascular system: Irregularly irregular rhythm Gastrointestinal system: Abdomen is nondistended, soft and nontender. Central nervous system: Alert and oriented Extremities: No edema, no clubbing ,no cyanosis Skin: No rashes, no ulcers,no icterus    Data Reviewed: I have personally reviewed following labs and imaging studies  CBC: Recent Labs  Lab 05/03/21 1008 05/03/21 1417 05/04/21 0406 05/05/21 0412  WBC 4.7  --  4.2 7.5  NEUTROABS 3.2  --   --   --   HGB 10.2* 9.5* 9.0* 8.6*  HCT 34.7* 28.0* 29.4* 28.0*  MCV 103.3*  --  101.4* 100.0  PLT 136*  --  141* 832*   Basic Metabolic Panel: Recent Labs  Lab 05/03/21 1008 05/03/21 1417 05/04/21 0406 05/05/21 0412  NA 141 141 138 139  K 4.7 4.7 5.1 4.9  CL 102  --  102 102  CO2 35*  --  29 29  GLUCOSE 94  --  139* 117*  BUN 24*  --  33* 45*  CREATININE 1.51*  --  1.91* 1.93*  CALCIUM 8.5*  --  8.0* 8.4*   GFR: Estimated Creatinine Clearance: 21.5 mL/min (A) (by C-G formula based on SCr of 1.93 mg/dL (H)). Liver Function Tests: Recent Labs  Lab 05/03/21 1330  AST 18  ALT 7  ALKPHOS 48  BILITOT 0.5  PROT 6.4*  ALBUMIN 2.6*   No results for input(s): LIPASE, AMYLASE in the last 168 hours. No results for input(s): AMMONIA in the last 168 hours. Coagulation Profile: No results for input(s): INR, PROTIME in the last 168 hours. Cardiac Enzymes: No results for input(s): CKTOTAL, CKMB, CKMBINDEX, TROPONINI in the last 168 hours. BNP (last 3 results) No results for input(s): PROBNP in the last 8760 hours. HbA1C: No  results for input(s): HGBA1C in the last 72 hours. CBG: No results for input(s): GLUCAP in the last 168 hours. Lipid Profile: No results for input(s): CHOL, HDL, LDLCALC, TRIG, CHOLHDL, LDLDIRECT in the last 72 hours. Thyroid Function Tests: Recent Labs    05/03/21 1429  TSH 8.955*   Anemia Panel: No results for input(s): VITAMINB12, FOLATE, FERRITIN, TIBC, IRON, RETICCTPCT in the last 72 hours. Sepsis Labs: No results for input(s): PROCALCITON, LATICACIDVEN in the last 168 hours.  Recent Results (from the past 240 hour(s))  Resp Panel by RT-PCR (Flu A&B, Covid) Nasopharyngeal Swab     Status: None   Collection Time: 05/03/21 10:01 AM   Specimen: Nasopharyngeal Swab; Nasopharyngeal(NP) swabs in vial transport medium  Result Value Ref Range Status   SARS Coronavirus 2 by RT PCR NEGATIVE NEGATIVE Final    Comment: (NOTE) SARS-CoV-2 target nucleic acids are NOT  DETECTED.  The SARS-CoV-2 RNA is generally detectable in upper respiratory specimens during the acute phase of infection. The lowest concentration of SARS-CoV-2 viral copies this assay can detect is 138 copies/mL. A negative result does not preclude SARS-Cov-2 infection and should not be used as the sole basis for treatment or other patient management decisions. A negative result may occur with  improper specimen collection/handling, submission of specimen other than nasopharyngeal swab, presence of viral mutation(s) within the areas targeted by this assay, and inadequate number of viral copies(<138 copies/mL). A negative result must be combined with clinical observations, patient history, and epidemiological information. The expected result is Negative.  Fact Sheet for Patients:  EntrepreneurPulse.com.au  Fact Sheet for Healthcare Providers:  IncredibleEmployment.be  This test is no t yet approved or cleared by the [place] FDA and  has been authorized for detection and/or  diagnosis of SARS-CoV-2 by FDA under an Emergency Use Authorization (EUA). This EUA will remain  in effect (meaning this test can be used) for the duration of the COVID-19 declaration under Section 564(b)(1) of the Act, 21 U.S.C.section 360bbb-3(b)(1), unless the authorization is terminated  or revoked sooner.       Influenza A by PCR NEGATIVE NEGATIVE Final   Influenza B by PCR NEGATIVE NEGATIVE Final    Comment: (NOTE) The Xpert Xpress SARS-CoV-2/FLU/RSV plus assay is intended as an aid in the diagnosis of influenza from Nasopharyngeal swab specimens and should not be used as a sole basis for treatment. Nasal washings and aspirates are unacceptable for Xpert Xpress SARS-CoV-2/FLU/RSV testing.  Fact Sheet for Patients: EntrepreneurPulse.com.au  Fact Sheet for Healthcare Providers: IncredibleEmployment.be  This test is not yet approved or cleared by the [place] FDA and has been authorized for detection and/or diagnosis of SARS-CoV-2 by FDA under an Emergency Use Authorization (EUA). This EUA will remain in effect (meaning this test can be used) for the duration of the COVID-19 declaration under Section 564(b)(1) of the Act, 21 U.S.C. section 360bbb-3(b)(1), unless the authorization is terminated or revoked.  Performed at [facility], [place] [address] [place], [place] [address]   Group A Strep by PCR     Status: None   Collection Time: 05/03/21 10:02 AM   Specimen: Nasopharyngeal Swab; Sterile Swab  Result Value Ref Range Status   Group A Strep by PCR NOT DETECTED NOT DETECTED Final    Comment: Performed at [facility], [place] [address] [place], [place] [address]         Radiology Studies: CT CHEST WO CONTRAST  Result Date: 05/03/2021 CLINICAL DATA:  Abnormal chest radiographs, pleural effusions EXAM: CT CHEST WITHOUT CONTRAST TECHNIQUE: Multidetector CT imaging of the chest was performed following the standard  protocol without IV contrast. COMPARISON:  Same-day chest radiographs, CT chest angiogram, 02/19/2020 FINDINGS: Cardiovascular: Aortic atherosclerosis. Cardiomegaly. Three-vessel coronary artery calcifications. Gross enlargement of the main pulmonary artery measuring up to 4.0 cm in caliber. Small pericardial effusion, diminished compared to prior examination. Mediastinum/Nodes: Unchanged prominent mediastinal and hilar lymph nodes. Thyroid gland, trachea, and esophagus demonstrate no significant findings. Lungs/Pleura: Moderate centrilobular emphysema. There are numerous scattered, somewhat ill-defined ground-glass and heterogeneous airspace opacities throughout the lungs, most concentrated in the left upper lobe (series 4, image 61, 76, 53). Unchanged small, loculated bilateral pleural effusions associated atelectasis or consolidation. Upper Abdomen: No acute abnormality. Coarse, nodular contour of the liver. Musculoskeletal: No chest wall mass or suspicious bone lesions identified. IMPRESSION: 1. There are numerous scattered, somewhat ill-defined ground-glass  and heterogeneous airspace opacities throughout the lungs, most concentrated in the left upper lobe, new compared to prior examination. Findings are most consistent with multifocal infection. 2. Unchanged small, loculated bilateral pleural effusions and associated atelectasis or consolidation. 3. Moderate centrilobular emphysema. 4. Cardiomegaly with small pericardial effusion, diminished compared to prior examination. 5. Gross enlargement of the main pulmonary artery, as can be seen with pulmonary hypertension. 6. Coronary artery disease. 7. Coarse, nodular contour of the liver in the included upper abdomen, suggestive of cirrhosis. Aortic Atherosclerosis (ICD10-I70.0) and Emphysema (ICD10-J43.9). Electronically Signed   By: Eddie Candle M.D.   On: 05/03/2021 16:06   DG Chest Port 1 View  Result Date: 05/03/2021 CLINICAL DATA:  75 year old female with  history of worsening shortness of breath over the past week. Low blood pressure. EXAM: PORTABLE CHEST 1 VIEW COMPARISON:  Chest x-ray 06/05/2020. FINDINGS: Extensive ill-defined opacities and areas of interstitial prominence throughout the mid to lower lungs bilaterally, most severe in the lung bases. Small bilateral pleural effusions (right greater than left). Mild cephalization of the pulmonary vasculature. No pneumothorax. Mild cardiomegaly. Upper mediastinal contours are within normal limits. Atherosclerotic calcifications in the thoracic aorta. IMPRESSION: 1. The appearance of the chest suggests congestive heart failure, as above. 2. Bibasilar opacities likely reflect areas of subsegmental atelectasis, however, underlying airspace consolidation is difficult to entirely exclude. 3. Aortic atherosclerosis. Electronically Signed   By: Vinnie Langton M.D.   On: 05/03/2021 10:50   ECHOCARDIOGRAM COMPLETE  Result Date: 05/04/2021    ECHOCARDIOGRAM REPORT   Patient Name:   RAMI WADDLE Date of Exam: 05/04/2021 Medical Rec #:  [id number]           Height:       63.0 in Accession #:    [id number]          Weight:       135.1 lb Date of Birth:  1945/11/28           BSA:          1.637 m Patient Age:    45 years            BP:           94/40 mmHg Patient Gender: F                   HR:           77 bpm. Exam Location:  Inpatient Procedure: 2D Echo, Cardiac Doppler and Color Doppler Indications:    CHF-Acute Diastolic G29.52  History:        Patient has prior history of Echocardiogram examinations, most                 recent 07/13/2020. CHF, COPD and Pulmonary HTN; Risk                 Factors:Former Smoker and Hypertension.  Sonographer:    Bernadene Person RDCS Referring Phys: [id number] [place]  1. Left ventricular ejection fraction, by estimation, is 60 to 65%. The left ventricle has normal function. The left ventricle has no regional wall motion abnormalities. Left ventricular diastolic  parameters are indeterminate.  2. Right ventricular systolic function is normal. The right ventricular size is normal. Tricuspid regurgitation signal is inadequate for assessing PA pressure.  3. The mitral valve is normal in structure. No evidence of mitral valve regurgitation. No evidence of mitral stenosis.  4. The aortic valve has an indeterminant number of cusps. Aortic valve  regurgitation is not visualized. No aortic stenosis is present.  5. The inferior vena cava is dilated in size with >50% respiratory variability, suggesting right atrial pressure of 8 mmHg. FINDINGS  Left Ventricle: Left ventricular ejection fraction, by estimation, is 60 to 65%. The left ventricle has normal function. The left ventricle has no regional wall motion abnormalities. The left ventricular internal cavity size was normal in size. There is  no left ventricular hypertrophy. Left ventricular diastolic parameters are indeterminate. Right Ventricle: The right ventricular size is normal. No increase in right ventricular wall thickness. Right ventricular systolic function is normal. Tricuspid regurgitation signal is inadequate for assessing PA pressure. Left Atrium: Left atrial size was normal in size. Right Atrium: Right atrial size was normal in size. Pericardium: There is no evidence of pericardial effusion. Mitral Valve: The mitral valve is normal in structure. No evidence of mitral valve regurgitation. No evidence of mitral valve stenosis. Tricuspid Valve: The tricuspid valve is not well visualized. Tricuspid valve regurgitation is trivial. No evidence of tricuspid stenosis. Aortic Valve: The aortic valve has an indeterminant number of cusps. Aortic valve regurgitation is not visualized. No aortic stenosis is present. Pulmonic Valve: The pulmonic valve was not well visualized. Pulmonic valve regurgitation is not visualized. No evidence of pulmonic stenosis. Aorta: The aortic root is normal in size and structure. Venous: The inferior  vena cava is dilated in size with greater than 50% respiratory variability, suggesting right atrial pressure of 8 mmHg. IAS/Shunts: No atrial level shunt detected by color flow Doppler.  LEFT VENTRICLE PLAX 2D LVIDd:         4.00 cm  Diastology LVIDs:         2.40 cm  LV e' medial:    6.53 cm/s LV PW:         1.10 cm  LV E/e' medial:  19.3 LV IVS:        0.90 cm  LV e' lateral:   6.87 cm/s LVOT diam:     1.80 cm  LV E/e' lateral: 18.3 LV SV:         69 LV SV Index:   42 LVOT Area:     2.54 cm  RIGHT VENTRICLE RV S prime:     11.00 cm/s TAPSE (M-mode): 1.8 cm LEFT ATRIUM           Index       RIGHT ATRIUM           Index LA diam:      3.20 cm 1.96 cm/m  RA Area:     12.10 cm LA Vol (A2C): 32.5 ml 19.86 ml/m RA Volume:   21.40 ml  13.08 ml/m LA Vol (A4C): 44.0 ml 26.88 ml/m  AORTIC VALVE LVOT Vmax:   113.00 cm/s LVOT Vmean:  75.600 cm/s LVOT VTI:    0.273 m  AORTA Ao Root diam: 2.70 cm Ao Asc diam:  2.80 cm MITRAL VALVE MV Area (PHT): 2.42 cm     SHUNTS MV Decel Time: 313 msec     Systemic VTI:  0.27 m MV E velocity: 126.00 cm/s  Systemic Diam: 1.80 cm MV A velocity: 95.60 cm/s MV E/A ratio:  1.32 Carlyle Dolly MD Electronically signed by Carlyle Dolly MD Signature Date/Time: 05/04/2021/11:04:39 AM    Final         Scheduled Meds:  ambrisentan  10 mg Oral Daily   amiodarone  100 mg Oral Daily   aspirin EC  81 mg Oral Daily  budesonide (PULMICORT) nebulizer solution  0.5 mg Nebulization BID   ferrous sulfate  325 mg Oral BID WC   fluticasone furoate-vilanterol  1 puff Inhalation Daily   guaiFENesin  1,200 mg Oral BID   heparin  5,000 Units Subcutaneous Q12H   ipratropium-albuterol  3 mL Nebulization TID   ketoconazole  1 application Topical Daily   nystatin  5 mL Oral QID   pantoprazole  40 mg Oral Daily   potassium chloride SA  20 mEq Oral Daily   predniSONE  40 mg Oral Q breakfast   spironolactone  12.5 mg Oral Daily   tadalafil  40 mg Oral Daily   Continuous Infusions:   LOS: 2  days    Time spent:25 mins, More than 50% of that time was spent in counseling and/or coordination of care.      Shelly Coss, MD [facility] P8/28/2022, 7:46 AM

## 2021-05-06 DIAGNOSIS — I5032 Chronic diastolic (congestive) heart failure: Secondary | ICD-10-CM | POA: Diagnosis not present

## 2021-05-06 DIAGNOSIS — J9621 Acute and chronic respiratory failure with hypoxia: Secondary | ICD-10-CM | POA: Diagnosis not present

## 2021-05-06 LAB — BASIC METABOLIC PANEL
Anion gap: 9 (ref 5–15)
BUN: 47 mg/dL — ABNORMAL HIGH (ref 8–23)
CO2: 30 mmol/L (ref 22–32)
Calcium: 8.3 mg/dL — ABNORMAL LOW (ref 8.9–10.3)
Chloride: 99 mmol/L (ref 98–111)
Creatinine, Ser: 1.96 mg/dL — ABNORMAL HIGH (ref 0.44–1.00)
GFR, Estimated: 27 mL/min — ABNORMAL LOW (ref >60)
Glucose, Bld: 112 mg/dL — ABNORMAL HIGH (ref 70–99)
Potassium: 5.6 mmol/L — ABNORMAL HIGH (ref 3.5–5.1)
Sodium: 138 mmol/L (ref 135–145)

## 2021-05-06 LAB — POTASSIUM: Potassium: 4.8 mmol/L (ref 3.5–5.1)

## 2021-05-06 LAB — IRON AND TIBC
Iron: 55 ug/dL (ref 28–170)
Saturation Ratios: 22 % (ref 10.4–31.8)
TIBC: 252 ug/dL (ref 250–450)
UIBC: 197 ug/dL

## 2021-05-06 LAB — FERRITIN: Ferritin: 162 ng/mL (ref 11–307)

## 2021-05-06 MED ORDER — SODIUM ZIRCONIUM CYCLOSILICATE 10 G PO PACK
10.0000 g | PACK | Freq: Once | ORAL | Status: AC
  Administered 2021-05-06: 10 g via ORAL
  Filled 2021-05-06: qty 1

## 2021-05-06 NOTE — Progress Notes (Signed)
Physical Therapy Treatment Patient Details Name: Linda Stevens MRN: [id number] DOB: [birth year] Today's Date: 05/06/2021    History of Present Illness 75 y.o. female admitted on 05/03/21 for cough, SOB.  Pt dx with acute on chronic diastolic CHF, acute COPD exacerbation, acute on chronic hypoxic respiratory failure.  Pt with significant PMH of asthma, emphysema, HTN, pericardial effusion, pulmonary HTN, PAF, CKD, liver cirrhosis, hypothyroid, chronic normocytic anemia.    PT Comments    Pt supine in bed with 2/4 DoE and complaints of breastbone pain from pressure required for sonogram yesterday. Pt reluctantly agreeable to get out of bed to recliner. While sitting EoB Case Manger and CHF case manager came into room, to discuss discharge planning with daughter in room and on the phone.  Given pt current level of function and increased DoE and oxygen demand, PT recommending wheelchair to be used for mobility within in home. Pt is min A-min guard to get up from bed and get to recliner, but requires ~4 min to recover. Discussed wheelchair is to be able to mobilize in home and facilitate getting up daily to eat and spend more time in upright with goal of working on progressing ambulation with HHPT. PT will continue to follow acutely.     Follow Up Recommendations  Home health PT     Equipment Recommendations  Wheelchair (measurements PT);Wheelchair cushion (measurements PT) (small)       Precautions / Restrictions Precautions Precautions: Fall;Other (comment) Precaution Comments: monitor vitals (BP and O2) Restrictions Weight Bearing Restrictions: No    Mobility  Bed Mobility Overal bed mobility: Needs Assistance Bed Mobility: Supine to Sit;Sit to Supine     Supine to sit: Min assist     General bed mobility comments: Min hand held assist to come up to sitting pt reports husband assists her in this way to pull up to EoB, once there able to scoot hips forward to place feet on  floor    Transfers Overall transfer level: Needs assistance Equipment used: Rolling walker (2 wheeled) Transfers: Sit to/from [facility] Sit to Stand: Min guard            Ambulation/Gait Ambulation/Gait assistance: Min guard Gait Distance (Feet): 3 Feet Assistive device: Rolling walker (2 wheeled) Gait Pattern/deviations: Step-through pattern;Decreased step length - right;Decreased step length - left;Shuffle;Trunk flexed Gait velocity: slowed Gait velocity interpretation: <1.31 ft/sec, indicative of household ambulator General Gait Details: min guard for walking from bed to recliner, pt declined to go further as she was having so much trouble breathing          Balance Overall balance assessment: Needs assistance Sitting-balance support: No upper extremity supported;Feet supported Sitting balance-Leahy Scale: Fair     Standing balance support: Bilateral upper extremity supported;No upper extremity supported;Single extremity supported Standing balance-Leahy Scale: Poor Standing balance comment: requires UE support on RW                            Cognition Arousal/Alertness: Awake/alert Behavior During Therapy: WFL for tasks assessed/performed Overall Cognitive Status: Within Functional Limits for tasks assessed                                           General Comments General comments (skin integrity, edema, etc.): Pt on 4L O2 via [place] on entry SaO2 91%O2, pt with 2/4 DoE  in supine, able to encourage pt to at least sit up in recliner, SaO2 on 4 L [place] dropped to 75%O2 with ambulation to chair, vc for pursed lip breathing and SaO2 rebounded to 90%O2 within ~4 minutes. With sitting upright SaO2 continued to rise to 98%O2. Daughter in room and one on facetime during session, also Case Manager, discussed HHAide to assist with iADLs      Pertinent Vitals/Pain Pain Assessment: 0-10 Pain Score: 4  Pain Location: breast bone  where sonogram was done Pain Descriptors / Indicators: Aching;Sore Pain Intervention(s): Limited activity within patient's tolerance;Monitored during session;Repositioned;Heat applied     PT Goals (current goals can now be found in the care plan section) Acute Rehab PT Goals Patient Stated Goal: to go home when her breathing is better PT Goal Formulation: With patient Time For Goal Achievement: 05/18/21 Potential to Achieve Goals: Good Progress towards PT goals: Progressing toward goals    Frequency    Min 3X/week      PT Plan Equipment recommendations need to be updated       AM-PAC PT "6 Clicks" Mobility   Outcome Measure  Help needed turning from your back to your side while in a flat bed without using bedrails?: A Little Help needed moving from lying on your back to sitting on the side of a flat bed without using bedrails?: A Little Help needed moving to and from a bed to a chair (including a wheelchair)?: A Little Help needed standing up from a chair using your arms (e.g., wheelchair or bedside chair)?: A Little Help needed to walk in hospital room?: A Lot Help needed climbing 3-5 steps with a railing? : A Lot 6 Click Score: 16    End of Session Equipment Utilized During Treatment: Oxygen (4L O2 [place]) Activity Tolerance: Patient limited by lethargy;Other (comment) (limited by increased WOB/DOE) Patient left: with call bell/phone within reach;with family/visitor present;in chair Nurse Communication: Mobility status PT Visit Diagnosis: Muscle weakness (generalized) (M62.81);Difficulty in walking, not elsewhere classified (R26.2)     Time: 0100-7121 PT Time Calculation (min) (ACUTE ONLY): 27 min  Charges:  $Therapeutic Activity: 8-22 mins $Self Care/Home Management: 8-22                     Zakeria Kulzer B. Migdalia Dk PT, DPT Acute Rehabilitation Services Pager [phone] Office [phone]    [place] 05/06/2021, 10:54 AM

## 2021-05-06 NOTE — TOC Initial Note (Addendum)
Transition of Care [facility]) - Initial/Assessment Note    Patient Details  Name: Linda Stevens MRN: [id number] Date of Birth: [birth year]  Transition of Care [facility]) CM/SW Contact:    Erenest Rasher, RN Phone Number: [phone] 05/06/2021, 12:27 PM  Clinical Narrative:                  HF TOC CM spoke to pt and dtr, Linda Stevens at bedside. Pt states she wants to go home. States she does not want to go back to a SNF rehab. Offered choice for [facility]. Agreeable to [place] for [place]. Will need [facility] RN and PT/OT order with [facility]. Pt may need wheelchair for home. Dtr will discuss with sister and pt about wheelchair. States she has a transport chair at home.   Received confirmation from [place], [place]. They have accepted referral.     Expected Discharge Plan: [place] Barriers to Discharge: Continued Medical Work up   Patient Goals and CMS Choice Patient states their goals for this hospitalization and ongoing recovery are:: prefers to go home CMS Medicare.gov Compare Post Acute Care list provided to:: Patient Choice offered to / list presented to : Patient  Expected Discharge Plan and Services Expected Discharge Plan: [place] In-house Referral: Clinical Social Work Discharge Planning Services: CM Consult Post Acute Care Choice: [place] arrangements for the past 2 months: Single Family Home                           HH Arranged: RN, PT, OT          Prior Living Arrangements/Services Living arrangements for the past 2 months: Single Family Home Lives with:: Spouse Patient language and need for interpreter reviewed:: Yes Do you feel safe going back to the place where you live?: Yes      Need for Family Participation in Patient Care: Yes (Comment) Care giver support system in place?: Yes (comment) Current home services: DME (oxygen ([employer]), transport chair, bedside commode, shower chair, rolling walker) Criminal  Activity/Legal Involvement Pertinent to Current Situation/Hospitalization: No - Comment as needed  Activities of Daily Living Home Assistive Devices/Equipment: None ADL Screening (condition at time of admission) Patient's cognitive ability adequate to safely complete daily activities?: Yes Is the patient deaf or have difficulty hearing?: No Does the patient have difficulty seeing, even when wearing glasses/contacts?: No Does the patient have difficulty concentrating, remembering, or making decisions?: No Patient able to express need for assistance with ADLs?: Yes Does the patient have difficulty dressing or bathing?: No Independently performs ADLs?: Yes (appropriate for developmental age) Does the patient have difficulty walking or climbing stairs?: Yes Weakness of Legs: Both Weakness of Arms/Hands: None  Permission Sought/Granted Permission sought to share information with : Case Manager, PCP, Family Supports Permission granted to share information with : Yes, Verbal Permission Granted  Share Information with NAME: Linda Stevens  Permission granted to share info w AGENCY: [facility] granted to share info w Relationship: daughter  Permission granted to share info w Contact Information: [phone]  Emotional Assessment Appearance:: Appears stated age Attitude/Demeanor/Rapport: Gracious, Engaged Affect (typically observed): Accepting Orientation: : Oriented to Self, Oriented to Place, Oriented to  Time, Oriented to Situation   Psych Involvement: No (comment)  Admission diagnosis:  [place] [B37.0] CHF (congestive heart failure) (HCC) [I50.9] Acute pulmonary edema (HCC) [J81.0] Acute on chronic respiratory failure with hypoxia ([place]) [  J96.21] Patient Active Problem List   Diagnosis Date Noted   CHF (congestive heart failure) ([place]) 05/03/2021   Onychomycosis 01/16/2021   Tinea pedis of both feet 01/16/2021   Skin rash 01/16/2021   Hypothyroid 11/23/2020   PAF (paroxysmal  atrial fibrillation) ([place]) 06/19/2020   Heme positive stool    Benign neoplasm of transverse colon    Benign neoplasm of rectum    Symptomatic anemia 06/05/2020   GI bleed secondary to colonic AVMs 06/05/2020   Acute blood loss anemia 06/05/2020   AKI (acute kidney injury) ([place]) 06/05/2020   DNR (do not resuscitate)    Palliative care by specialist    Community acquired pneumonia 02/19/2020   Atrial fibrillation with RVR ([place])    Chronic respiratory failure with hypoxia and hypercapnia ([place]) 07/08/2017   Pleural effusion, bilateral 07/08/2017   Chronic respiratory failure with hypoxia ([place]) 10/16/2015   Hepatic cirrhosis ([place]) 10/15/2015   Pulmonary artery hypertension (HCC)    Chronic diastolic CHF (congestive heart failure) ([place])    Acute on chronic respiratory failure with hypoxia (HCC)    COPD GOLD II criteria but 02 dep 24/7     Severe Pulmonary Hypertension    Pericardial effusion    Diastolic dysfunction    Goals of care, counseling/discussion 09/62/8366   Mild diastolic dysfunction 29/47/6546   Acute respiratory failure with hypoxia and hypercapnea 02/26/2014   Hypokalemia 02/22/2014   HTN (hypertension) 03/19/2012   Allergic rhinitis 01/15/2007   PCP:  [name], MD Pharmacy:   [id number] [place] [place] [id number]: [phone]: [phone] [facility] [id number]: [phone]: [phone]    Social Determinants of Health (SDOH) Interventions    Readmission Risk Interventions No flowsheet data found.

## 2021-05-06 NOTE — Progress Notes (Deleted)
K on recheck 4.8 this afternoon.  Continue to hold K supplement. Restart spiro at 12.5 mg daily.   Monitor K closely.

## 2021-05-06 NOTE — Progress Notes (Addendum)
PROGRESS NOTE    Linda Stevens  DJM:[id number] DOB: 07-Feb-1946 DOA: 05/03/2021 PCP: [name], MD   Chief Complain: Shortness of breath  Brief Narrative: Patient is a 75 year old female with history of severe pulmonary hypertension, chronic diastolic congestive heart failure, COPD Gold stage III, chronic hypoxic respiratory failure on 4 L of oxygen, CKD stage IIIb who presented with shortness of breath for a week.  She also reported cough, exertional dyspnea.  She follows with cardiology for history of severe pulmonary hypertension, diastolic congestive heart failure.  Takes Lasix at home.  On presentation she was borderline hypotensive so given 500 mL of bolus of  IV fluid but blood pressure remained low.  Admitted for HF exaceration.  CHF team  consulted and following.  Assessment & Plan:   Active Problems:   Chronic diastolic CHF (congestive heart failure) (HCC)   CHF (congestive heart failure) (HCC)   Acute on chronic diastolic congestive heart failure: Presented with symptoms of cough, dyspnea on exertion, hypotension.  CHF team following.  Chest imaging showed bilateral pleural effusion.  Elevated JVP.  Echo done during this hospitalization showed EF of 60 to 65%, no pericardial effusion.Chest CT showed pulmonary congestion, pleural effusion, small pericardial effusion.  Given a dose of 60 mg Lasix IV once,now on hold  Acute COPD exacerbation: Possibility of concurrent COPD exacerbation.  Currently on DuoNeb,prednisone.  Now free of wheezing  Hyperkalemia: Given a dose of Lokelma today.  resolved.  Check BMP tomorrow  Suspected multifocal pneumonia:CT chest showed numerous scattered, ill-defined ground-glass and heterogeneous airspace opacities throughout the lungs, consistent with multifocal pneumonia.  Started on ceftriaxone and azithromycin.  Patient does not have fever, no leukocytosis  Acute on chronic hypoxic respiratory failure: Secondary to CHF/COPD: On 4 L of  oxygen per minute at home,now at baseline  Hypertension: BP soft.  Monitor blood pressure  History of paroxysmal A. fib: .  No anticoagulation due to history of GI bleed.  On amiodarone at home which is being planned to be discontinued due to pulmonary findings  History of severe pulmonary hypertension with signs of cor pulmonale: On Ambrisentan, tadafil.  Follows with cardiology.  CKD stage IIIb: Creatinine baseline is around 1.5.  Creatinine has trended up slightly to 1.9 ,now pleateud.  Continue to monitor  Liver cirrhosis: As seen on CT.  Could be secondary to right heart failure.  Hepatitis serologies negative.  Hypothyroidism: Recently stopped taking Synthyroid,saying she cudnt tolerate it.  TSH is elevated at 8.9.  I recommended to be started on levothyroxine 50 mcg daily, but patient and her husband strictly denying to take it  Chronic normocytic anemia: Currently hemoglobin stable.  Has mild thrombocytopenia.Iron panel normal  Debility/deconditioning/weakness: We will have requested for PT/OT evaluation, recommended home health on discharge         DVT prophylaxis:heparin [place] Code Status: DNR Family Communication: Husband on phone on 8/29 Status is: Inpatient  Remains inpatient appropriate because:Inpatient level of care appropriate due to severity of illness  Dispo: The patient is from: Home              Anticipated d/c is to: Home              Patient currently is not medically stable to d/c.   Difficult to place patient No     Consultants: Cardiology  Procedures:None  Antimicrobials:  Anti-infectives (From admission, onward)    Start     Dose/Rate Route Frequency Ordered Stop   05/05/21 1130  cefTRIAXone (ROCEPHIN) 2 g in sodium chloride 0.9 % 100 mL IVPB        2 g 200 mL/hr over 30 Minutes Intravenous Every 24 hours 05/05/21 1040     05/05/21 1130  azithromycin (ZITHROMAX) tablet 500 mg        500 mg Oral Daily 05/05/21 1040          Subjective:  Patient seen and examined at the bedside this morning.  Hemodynamically stable.  She remains significantly weak.  Complains of shortness of breath.  Not feeling well.  Appears very very deconditioned.  Chest examination revealed bilateral decreased air entry but no wheezes or crackles.  She does not have peripheral edema.  Objective: Vitals:   05/05/21 1956 05/06/21 0000 05/06/21 0400 05/06/21 0500  BP: (!) 102/45 (!) 116/43 (!) 98/44   Pulse:  70 74   Resp:  (!) 21 18   Temp: 97.6 F (36.4 C) 98.1 F (36.7 C) 97.7 F (36.5 C)   TempSrc: Oral Oral Oral   SpO2:  100% 99%   Weight:    61.4 kg  Height:        Intake/Output Summary (Last 24 hours) at 05/06/2021 0806 Last data filed at 05/06/2021 0500 Gross per 24 hour  Intake 800 ml  Output 276 ml  Net 524 ml   Filed Weights   05/04/21 0241 05/05/21 0500 05/06/21 0500  Weight: 61.3 kg 61.8 kg 61.4 kg    Examination:  General exam: Extremely deconditioned, chronically ill looking, weak  HEENT: PERRL Respiratory system: Diminished air sounds bilaterally, no wheezes or crackles  Cardiovascular system: Irregularly irregular rhythm Gastrointestinal system: Abdomen is nondistended, soft and nontender. Central nervous system: Alert and oriented Extremities: No edema, no clubbing ,no cyanosis Skin: scattered purpura on face, bilateral upper extremities, no ulcers,no icterus    Data Reviewed: I have personally reviewed following labs and imaging studies  CBC: Recent Labs  Lab 05/03/21 1008 05/03/21 1417 05/04/21 0406 05/05/21 0412  WBC 4.7  --  4.2 7.5  NEUTROABS 3.2  --   --   --   HGB 10.2* 9.5* 9.0* 8.6*  HCT 34.7* 28.0* 29.4* 28.0*  MCV 103.3*  --  101.4* 100.0  PLT 136*  --  141* 102*   Basic Metabolic Panel: Recent Labs  Lab 05/03/21 1008 05/03/21 1417 05/04/21 0406 05/05/21 0412 05/06/21 0328  NA 141 141 138 139 138  K 4.7 4.7 5.1 4.9 5.6*  CL 102  --  102 102 99  CO2 35*  --  _0 GLUCOSE 94  --  139* 117* 112*  BUN 24*  --  33* 45* 47*  CREATININE 1.51*  --  1.91* 1.93* 1.96*  CALCIUM 8.5*  --  8.0* 8.4* 8.3*   GFR: Estimated Creatinine Clearance: 21.1 mL/min (A) (by C-G formula based on SCr of 1.96 mg/dL (H)). Liver Function Tests: Recent Labs  Lab 05/03/21 1330  AST 18  ALT 7  ALKPHOS 48  BILITOT 0.5  PROT 6.4*  ALBUMIN 2.6*   No results for input(s): LIPASE, AMYLASE in the last 168 hours. No results for input(s): AMMONIA in the last 168 hours. Coagulation Profile: No results for input(s): INR, PROTIME in the last 168 hours. Cardiac Enzymes: No results for input(s): CKTOTAL, CKMB, CKMBINDEX, TROPONINI in the last 168 hours. BNP (last 3 results) No results for input(s): PROBNP in the last 8760 hours. HbA1C: No results for input(s): HGBA1C in the last 72 hours. CBG: No  results for input(s): GLUCAP in the last 168 hours. Lipid Profile: No results for input(s): CHOL, HDL, LDLCALC, TRIG, CHOLHDL, LDLDIRECT in the last 72 hours. Thyroid Function Tests: Recent Labs    05/03/21 1429  TSH 8.955*   Anemia Panel: No results for input(s): VITAMINB12, FOLATE, FERRITIN, TIBC, IRON, RETICCTPCT in the last 72 hours. Sepsis Labs: No results for input(s): PROCALCITON, LATICACIDVEN in the last 168 hours.  Recent Results (from the past 240 hour(s))  Resp Panel by RT-PCR (Flu A&B, Covid) Nasopharyngeal Swab     Status: None   Collection Time: 05/03/21 10:01 AM   Specimen: Nasopharyngeal Swab; Nasopharyngeal(NP) swabs in vial transport medium  Result Value Ref Range Status   SARS Coronavirus 2 by RT PCR NEGATIVE NEGATIVE Final    Comment: (NOTE) SARS-CoV-2 target nucleic acids are NOT DETECTED.  The SARS-CoV-2 RNA is generally detectable in upper respiratory specimens during the acute phase of infection. The lowest concentration of SARS-CoV-2 viral copies this assay can detect is 138 copies/mL. A negative result does not preclude SARS-Cov-2 infection and  should not be used as the sole basis for treatment or other patient management decisions. A negative result may occur with  improper specimen collection/handling, submission of specimen other than nasopharyngeal swab, presence of viral mutation(s) within the areas targeted by this assay, and inadequate number of viral copies(<138 copies/mL). A negative result must be combined with clinical observations, patient history, and epidemiological information. The expected result is Negative.  Fact Sheet for Patients:  EntrepreneurPulse.com.au  Fact Sheet for Healthcare Providers:  IncredibleEmployment.be  This test is no t yet approved or cleared by the [place] FDA and  has been authorized for detection and/or diagnosis of SARS-CoV-2 by FDA under an Emergency Use Authorization (EUA). This EUA will remain  in effect (meaning this test can be used) for the duration of the COVID-19 declaration under Section 564(b)(1) of the Act, 21 U.S.C.section 360bbb-3(b)(1), unless the authorization is terminated  or revoked sooner.       Influenza A by PCR NEGATIVE NEGATIVE Final   Influenza B by PCR NEGATIVE NEGATIVE Final    Comment: (NOTE) The Xpert Xpress SARS-CoV-2/FLU/RSV plus assay is intended as an aid in the diagnosis of influenza from Nasopharyngeal swab specimens and should not be used as a sole basis for treatment. Nasal washings and aspirates are unacceptable for Xpert Xpress SARS-CoV-2/FLU/RSV testing.  Fact Sheet for Patients: EntrepreneurPulse.com.au  Fact Sheet for Healthcare Providers: IncredibleEmployment.be  This test is not yet approved or cleared by the [place] FDA and has been authorized for detection and/or diagnosis of SARS-CoV-2 by FDA under an Emergency Use Authorization (EUA). This EUA will remain in effect (meaning this test can be used) for the duration of the COVID-19 declaration  under Section 564(b)(1) of the Act, 21 U.S.C. section 360bbb-3(b)(1), unless the authorization is terminated or revoked.  Performed at [facility], [place] [address] [place], [place] [address]   Group A Strep by PCR     Status: None   Collection Time: 05/03/21 10:02 AM   Specimen: Nasopharyngeal Swab; Sterile Swab  Result Value Ref Range Status   Group A Strep by PCR NOT DETECTED NOT DETECTED Final    Comment: Performed at [facility], [place] [address] [place],  [address]         Radiology Studies: ECHOCARDIOGRAM COMPLETE  Result Date: 05/04/2021    ECHOCARDIOGRAM REPORT   Patient Name:   Linda Stevens Date of Exam: 05/04/2021  Medical Rec #:  [id number]           Height:       63.0 in Accession #:    [id number]          Weight:       135.1 lb Date of Birth:  September 01, 1946           BSA:          1.637 m Patient Age:    61 years            BP:           94/40 mmHg Patient Gender: F                   HR:           77 bpm. Exam Location:  Inpatient Procedure: 2D Echo, Cardiac Doppler and Color Doppler Indications:    CHF-Acute Diastolic J00.93  History:        Patient has prior history of Echocardiogram examinations, most                 recent 07/13/2020. CHF, COPD and Pulmonary HTN; Risk                 Factors:Former Smoker and Hypertension.  Sonographer:    Bernadene Person RDCS Referring Phys: [id number] [place]  1. Left ventricular ejection fraction, by estimation, is 60 to 65%. The left ventricle has normal function. The left ventricle has no regional wall motion abnormalities. Left ventricular diastolic parameters are indeterminate.  2. Right ventricular systolic function is normal. The right ventricular size is normal. Tricuspid regurgitation signal is inadequate for assessing PA pressure.  3. The mitral valve is normal in structure. No evidence of mitral valve regurgitation. No evidence of mitral stenosis.  4. The aortic valve has an indeterminant number of  cusps. Aortic valve regurgitation is not visualized. No aortic stenosis is present.  5. The inferior vena cava is dilated in size with >50% respiratory variability, suggesting right atrial pressure of 8 mmHg. FINDINGS  Left Ventricle: Left ventricular ejection fraction, by estimation, is 60 to 65%. The left ventricle has normal function. The left ventricle has no regional wall motion abnormalities. The left ventricular internal cavity size was normal in size. There is  no left ventricular hypertrophy. Left ventricular diastolic parameters are indeterminate. Right Ventricle: The right ventricular size is normal. No increase in right ventricular wall thickness. Right ventricular systolic function is normal. Tricuspid regurgitation signal is inadequate for assessing PA pressure. Left Atrium: Left atrial size was normal in size. Right Atrium: Right atrial size was normal in size. Pericardium: There is no evidence of pericardial effusion. Mitral Valve: The mitral valve is normal in structure. No evidence of mitral valve regurgitation. No evidence of mitral valve stenosis. Tricuspid Valve: The tricuspid valve is not well visualized. Tricuspid valve regurgitation is trivial. No evidence of tricuspid stenosis. Aortic Valve: The aortic valve has an indeterminant number of cusps. Aortic valve regurgitation is not visualized. No aortic stenosis is present. Pulmonic Valve: The pulmonic valve was not well visualized. Pulmonic valve regurgitation is not visualized. No evidence of pulmonic stenosis. Aorta: The aortic root is normal in size and structure. Venous: The inferior vena cava is dilated in size with greater than 50% respiratory variability, suggesting right atrial pressure of 8 mmHg. IAS/Shunts: No atrial level shunt detected by color flow Doppler.  LEFT VENTRICLE PLAX 2D LVIDd:  4.00 cm  Diastology LVIDs:         2.40 cm  LV e' medial:    6.53 cm/s LV PW:         1.10 cm  LV E/e' medial:  19.3 LV IVS:        0.90  cm  LV e' lateral:   6.87 cm/s LVOT diam:     1.80 cm  LV E/e' lateral: 18.3 LV SV:         69 LV SV Index:   42 LVOT Area:     2.54 cm  RIGHT VENTRICLE RV S prime:     11.00 cm/s TAPSE (M-mode): 1.8 cm LEFT ATRIUM           Index       RIGHT ATRIUM           Index LA diam:      3.20 cm 1.96 cm/m  RA Area:     12.10 cm LA Vol (A2C): 32.5 ml 19.86 ml/m RA Volume:   21.40 ml  13.08 ml/m LA Vol (A4C): 44.0 ml 26.88 ml/m  AORTIC VALVE LVOT Vmax:   113.00 cm/s LVOT Vmean:  75.600 cm/s LVOT VTI:    0.273 m  AORTA Ao Root diam: 2.70 cm Ao Asc diam:  2.80 cm MITRAL VALVE MV Area (PHT): 2.42 cm     SHUNTS MV Decel Time: 313 msec     Systemic VTI:  0.27 m MV E velocity: 126.00 cm/s  Systemic Diam: 1.80 cm MV A velocity: 95.60 cm/s MV E/A ratio:  1.32 Carlyle Dolly MD Electronically signed by Carlyle Dolly MD Signature Date/Time: 05/04/2021/11:04:39 AM    Final         Scheduled Meds:  ambrisentan  10 mg Oral Daily   amiodarone  100 mg Oral Daily   aspirin EC  81 mg Oral Daily   azithromycin  500 mg Oral Daily   budesonide (PULMICORT) nebulizer solution  0.5 mg Nebulization BID   ferrous sulfate  325 mg Oral BID WC   fluticasone furoate-vilanterol  1 puff Inhalation Daily   guaiFENesin  1,200 mg Oral BID   heparin  5,000 Units Subcutaneous Q12H   ipratropium-albuterol  3 mL Nebulization TID   ketoconazole  1 application Topical Daily   nystatin  5 mL Oral QID   pantoprazole  40 mg Oral Daily   predniSONE  40 mg Oral Q breakfast   sodium zirconium cyclosilicate  10 g Oral Once   tadalafil  40 mg Oral Daily   Continuous Infusions:  cefTRIAXone (ROCEPHIN)  IV 2 g (05/05/21 1209)     LOS: 3 days    Time spent:25 mins, More than 50% of that time was spent in counseling and/or coordination of care.      Shelly Coss, MD [facility] P8/29/2022, 8:06 AM

## 2021-05-06 NOTE — Progress Notes (Addendum)
Advanced Heart Failure Rounding Note   Subjective:    Comfortable at rest but reports dyspnea with minimal exertion. Recovers within a few minutes of rest. No CP. Denies LE edema.  She is worried about being able to return home.  Scr remains around 1.9.  Weight stable.     CARDIAC STUDIES: Echo 08/27: EF 60-65%, RV okay  Objective:   Weight Range:  Vital Signs:   Temp:  [97.6 F (36.4 C)-98.1 F (36.7 C)] 97.7 F (36.5 C) (08/29 0400) Pulse Rate:  [70-74] 74 (08/29 0400) Resp:  [18-21] 18 (08/29 0400) BP: (98-123)/(43-45) 98/44 (08/29 0400) SpO2:  [90 %-100 %] 99 % (08/29 0400) Weight:  [61.4 kg] 61.4 kg (08/29 0500) Last BM Date: 05/05/21  Weight change: Filed Weights   05/04/21 0241 05/05/21 0500 05/06/21 0500  Weight: 61.3 kg 61.8 kg 61.4 kg    Intake/Output:   Intake/Output Summary (Last 24 hours) at 05/06/2021 0708 Last data filed at 05/06/2021 0500 Gross per 24 hour  Intake 800 ml  Output 276 ml  Net 524 ml     Physical Exam: General:  Thin, elderly female No resp difficulty on 4L 02. Sitting up in bed HEENT: normal. Telangiectasias Neck: supple. JVP 7-8 cm. Carotids 2+ bilat; no bruits. No lymphadenopathy or thryomegaly appreciated. Cor: PMI nondisplaced. Regular rate & rhythm. No rubs, gallops or murmurs. Lungs: Diminished. Scattered expiratory wheezes. Abdomen: soft, nontender, nondistended. No hepatosplenomegaly. No bruits or masses. Good bowel sounds. Extremities: no cyanosis, clubbing, rash, edema Neuro: alert & orientedx3, cranial nerves grossly intact. moves all 4 extremities w/o difficulty. Affect pleasant   Telemetry: SR with PACs, 70s - 80s (personally reviewed)   Labs: Basic Metabolic Panel: Recent Labs  Lab 05/03/21 1008 05/03/21 1417 05/04/21 0406 05/05/21 0412 05/06/21 0328  NA 141 141 138 139 138  K 4.7 4.7 5.1 4.9 5.6*  CL 102  --  102 102 99  CO2 35*  --  _0 GLUCOSE 94  --  139* 117* 112*  BUN 24*  --   33* 45* 47*  CREATININE 1.51*  --  1.91* 1.93* 1.96*  CALCIUM 8.5*  --  8.0* 8.4* 8.3*    Liver Function Tests: Recent Labs  Lab 05/03/21 1330  AST 18  ALT 7  ALKPHOS 48  BILITOT 0.5  PROT 6.4*  ALBUMIN 2.6*   No results for input(s): LIPASE, AMYLASE in the last 168 hours. No results for input(s): AMMONIA in the last 168 hours.  CBC: Recent Labs  Lab 05/03/21 1008 05/03/21 1417 05/04/21 0406 05/05/21 0412  WBC 4.7  --  4.2 7.5  NEUTROABS 3.2  --   --   --   HGB 10.2* 9.5* 9.0* 8.6*  HCT 34.7* 28.0* 29.4* 28.0*  MCV 103.3*  --  101.4* 100.0  PLT 136*  --  141* 146*    Cardiac Enzymes: No results for input(s): CKTOTAL, CKMB, CKMBINDEX, TROPONINI in the last 168 hours.  BNP: BNP (last 3 results) Recent Labs    07/02/20 1441 11/02/20 1413 05/03/21 1105  BNP 344.5* 448.2* 358.4*    ProBNP (last 3 results) No results for input(s): PROBNP in the last 8760 hours.    Other results:  Imaging: ECHOCARDIOGRAM COMPLETE  Result Date: 05/04/2021    ECHOCARDIOGRAM REPORT   Patient Name:   Linda Stevens Date of Exam: 05/04/2021 Medical Rec #:  [id number]           Height:  63.0 in Accession #:    [id number]          Weight:       135.1 lb Date of Birth:  11-05-1945           BSA:          1.637 m Patient Age:    75 years            BP:           94/40 mmHg Patient Gender: F                   HR:           77 bpm. Exam Location:  Inpatient Procedure: 2D Echo, Cardiac Doppler and Color Doppler Indications:    CHF-Acute Diastolic M01.02  History:        Patient has prior history of Echocardiogram examinations, most                 recent 07/13/2020. CHF, COPD and Pulmonary HTN; Risk                 Factors:Former Smoker and Hypertension.  Sonographer:    Bernadene Person RDCS Referring Phys: [id number] [place]  1. Left ventricular ejection fraction, by estimation, is 60 to 65%. The left ventricle has normal function. The left ventricle has no regional wall  motion abnormalities. Left ventricular diastolic parameters are indeterminate.  2. Right ventricular systolic function is normal. The right ventricular size is normal. Tricuspid regurgitation signal is inadequate for assessing PA pressure.  3. The mitral valve is normal in structure. No evidence of mitral valve regurgitation. No evidence of mitral stenosis.  4. The aortic valve has an indeterminant number of cusps. Aortic valve regurgitation is not visualized. No aortic stenosis is present.  5. The inferior vena cava is dilated in size with >50% respiratory variability, suggesting right atrial pressure of 8 mmHg. FINDINGS  Left Ventricle: Left ventricular ejection fraction, by estimation, is 60 to 65%. The left ventricle has normal function. The left ventricle has no regional wall motion abnormalities. The left ventricular internal cavity size was normal in size. There is  no left ventricular hypertrophy. Left ventricular diastolic parameters are indeterminate. Right Ventricle: The right ventricular size is normal. No increase in right ventricular wall thickness. Right ventricular systolic function is normal. Tricuspid regurgitation signal is inadequate for assessing PA pressure. Left Atrium: Left atrial size was normal in size. Right Atrium: Right atrial size was normal in size. Pericardium: There is no evidence of pericardial effusion. Mitral Valve: The mitral valve is normal in structure. No evidence of mitral valve regurgitation. No evidence of mitral valve stenosis. Tricuspid Valve: The tricuspid valve is not well visualized. Tricuspid valve regurgitation is trivial. No evidence of tricuspid stenosis. Aortic Valve: The aortic valve has an indeterminant number of cusps. Aortic valve regurgitation is not visualized. No aortic stenosis is present. Pulmonic Valve: The pulmonic valve was not well visualized. Pulmonic valve regurgitation is not visualized. No evidence of pulmonic stenosis. Aorta: The aortic root is  normal in size and structure. Venous: The inferior vena cava is dilated in size with greater than 50% respiratory variability, suggesting right atrial pressure of 8 mmHg. IAS/Shunts: No atrial level shunt detected by color flow Doppler.  LEFT VENTRICLE PLAX 2D LVIDd:         4.00 cm  Diastology LVIDs:         2.40 cm  LV e' medial:  6.53 cm/s LV PW:         1.10 cm  LV E/e' medial:  19.3 LV IVS:        0.90 cm  LV e' lateral:   6.87 cm/s LVOT diam:     1.80 cm  LV E/e' lateral: 18.3 LV SV:         69 LV SV Index:   42 LVOT Area:     2.54 cm  RIGHT VENTRICLE RV S prime:     11.00 cm/s TAPSE (M-mode): 1.8 cm LEFT ATRIUM           Index       RIGHT ATRIUM           Index LA diam:      3.20 cm 1.96 cm/m  RA Area:     12.10 cm LA Vol (A2C): 32.5 ml 19.86 ml/m RA Volume:   21.40 ml  13.08 ml/m LA Vol (A4C): 44.0 ml 26.88 ml/m  AORTIC VALVE LVOT Vmax:   113.00 cm/s LVOT Vmean:  75.600 cm/s LVOT VTI:    0.273 m  AORTA Ao Root diam: 2.70 cm Ao Asc diam:  2.80 cm MITRAL VALVE MV Area (PHT): 2.42 cm     SHUNTS MV Decel Time: 313 msec     Systemic VTI:  0.27 m MV E velocity: 126.00 cm/s  Systemic Diam: 1.80 cm MV A velocity: 95.60 cm/s MV E/A ratio:  1.32 Carlyle Dolly MD Electronically signed by Carlyle Dolly MD Signature Date/Time: 05/04/2021/11:04:39 AM    Final      Medications:     Scheduled Medications:  ambrisentan  10 mg Oral Daily   amiodarone  100 mg Oral Daily   aspirin EC  81 mg Oral Daily   azithromycin  500 mg Oral Daily   budesonide (PULMICORT) nebulizer solution  0.5 mg Nebulization BID   ferrous sulfate  325 mg Oral BID WC   fluticasone furoate-vilanterol  1 puff Inhalation Daily   guaiFENesin  1,200 mg Oral BID   heparin  5,000 Units Subcutaneous Q12H   ipratropium-albuterol  3 mL Nebulization TID   ketoconazole  1 application Topical Daily   nystatin  5 mL Oral QID   pantoprazole  40 mg Oral Daily   potassium chloride SA  20 mEq Oral Daily   predniSONE  40 mg Oral Q breakfast    spironolactone  12.5 mg Oral Daily   tadalafil  40 mg Oral Daily    Infusions:  cefTRIAXone (ROCEPHIN)  IV 2 g (05/05/21 1209)    PRN Medications: acetaminophen, albuterol, hydrOXYzine, melatonin   Assessment/Plan:   1.  A/C respiratory failure with hypoxia: - On 4L 02 at home, requiring 6L on initial presentation. Back to home 02 requirements with stable sats. - Suspect primarily d/t COPD exacerbation/possible CAP. Continues to improve with treatment of COPD flare. - AF, no leukocytosis - Management per TRH   2. Chronic diastolic HF with RV failure: - Repeat RHC 12/18 with mild PAH (mean PA 33) - Echo 5/18 with complete resolution of RV strain. No significant TR to estimate RVSP - Echo 4/19 with EF 65% no evidence of RV strain  - echo  12/09/19 EF 70-75% RV normal  - Echo 11/21 EF 75% RV normal no evidence PAH - Echo 08/27 EF 60-65%, RV okay, inadequate TR signal to assess PA pressure - Resolution of PAH on recent echos - Auto-immune panel previously negative. Suspicion for CTD. - Admitted with worsening dyspnea and hypoxia.  Small pleural effusions noted on chest x-ray. Weight actually down 7 lb from last visit in February.ReDS 18%. BNP 358 which is near trend over last year - Got 1 dose IV lasix with minimal response. Creatinine trending up to 1.96, BUN also elevated. Appears compensated. Hold further diuresis. - Echo 05/04/21 EF 60-65% RV ok. No significant effusion    3. PAH - suspect combination of WHO Group I and III PAH - On Letairis and Tadalafil. Previously discussed adding Selexipag but pressures down on last cath/echo   4. Recurrent R pleural effusion -  Suspect possible component of hepatic hydrothorax. Now controlled with with diuresis - Small bilateral effusions noted on CXR/CT this admit - not changed   5.  Pericardial effusion: - small effusion on CT (improved)   6.  PAF: - NSR today.  - On amio 100 mg daily.Can stop once COPD flare resolved - Eliquis  stopped due to severe LGIB 9/21 (hgb 3.1). Will not restart with AVMs   7. Cirrhosis - due to RHF. Hepatitis serologies are negative. Possibly due to right heart failure. - given EF > 75% may need to consider propanolol +/- midodrine to avoid high-output HF at some point  8. AKI - SCr 1.4 -> 1.91 -> 1.93 -> 1.96. Likely overdiuresis.  - No protein or RBCs on UA - Soft BP.  - Continue to hold diuretics as above.   9. Hyperkalemia -K 5.6.  -Stop K supplement and hold spiro -Monitor closely  10. Hypothyroidism: -Not taking home synthroid. TSH 8.9. Has declined to restart.  11. Anemia: -Hgb trend 10.2>9.5>9.0>8.6 -Monitor -Check iron studies -She is not anticoagulated -Consider stopping aspirin as she had normal coronaries on LHC in 2017   Disposition: -HH with PT/OT at discharge  Length of Stay: [place], LINDSAY N MD 05/06/2021, 7:08 AM  Advanced Heart Failure Team Pager [phone] (M-F; [place])  Please contact [facility] for night-coverage after hours (4p -7a ) and weekends on [email or web address]  Patient seen and examined with the above-signed Advanced Practice Provider and/or Housestaff. I personally reviewed laboratory data, imaging studies and relevant notes. I independently examined the patient and formulated the important aspects of the plan. I have edited the note to reflect any of my changes or salient points. I have personally discussed the plan with the patient and/or family.  Feels weak but otherwise ok. Volume status stable to dry. SCR still mildly elevated  General:  Weak appearing. No resp difficulty HEENT: normal + telengectasias  Neck: supple. no JVD. Carotids 2+ bilat; no bruits. No lymphadenopathy or thryomegaly appreciated. Cor: PMI nondisplaced. Regular rate & rhythm. No rubs, gallops or murmurs. Lungs: decreased throughout but no wheeze  Abdomen: soft, nontender, nondistended. No hepatosplenomegaly. No bruits or masses. Good bowel sounds. Extremities:  no cyanosis, clubbing, rash, edema Neuro: alert & orientedx3, cranial nerves grossly intact. moves all 4 extremities w/o difficulty. Affect pleasant  Stable from cardiac perspective for d/c. PT/OT have seen and recommend HHPT.   AHF team will sign off. We will arrange outpatient f/u.   [name], MD  4:55 PM

## 2021-05-06 NOTE — Progress Notes (Addendum)
CSW and RNCM spoke with the patient and her daughter, [name] at bedside. CSW completed a very brief SDOH with the patient who denied having any needs at this time. Patient reported they do have a PCP and they can get to the pharmacy to pick up their medications and she is no longer driving but has help from her spouse and family. [name] reported that her mom lives at home with her husband and he is working part time. CSW provided the patient and family with the social workers name and position and if anything changes to please reach out so that CSW can provide support. DNR is on the front of the patients chart awaiting signature, secure chat sent to attending MD to notify regarding DNR.  CSW will continue to follow throughout discharge.  Maria Gallicchio, MSW, [place] Heart Failure Social Worker

## 2021-05-07 LAB — BASIC METABOLIC PANEL
Anion gap: 10 (ref 5–15)
BUN: 41 mg/dL — ABNORMAL HIGH (ref 8–23)
CO2: 29 mmol/L (ref 22–32)
Calcium: 8.4 mg/dL — ABNORMAL LOW (ref 8.9–10.3)
Chloride: 101 mmol/L (ref 98–111)
Creatinine, Ser: 1.77 mg/dL — ABNORMAL HIGH (ref 0.44–1.00)
GFR, Estimated: 30 mL/min — ABNORMAL LOW (ref >60)
Glucose, Bld: 100 mg/dL — ABNORMAL HIGH (ref 70–99)
Potassium: 4.9 mmol/L (ref 3.5–5.1)
Sodium: 140 mmol/L (ref 135–145)

## 2021-05-07 NOTE — Progress Notes (Signed)
Occupational Therapy Treatment Patient Details Name: Linda Stevens MRN: [id number] DOB: 20-Feb-1946 Today's Date: 05/07/2021    History of present illness 75 y.o. female admitted on 05/03/21 for cough, SOB.  Pt dx with acute on chronic diastolic CHF, acute COPD exacerbation, acute on chronic hypoxic respiratory failure.  Pt with significant PMH of asthma, emphysema, HTN, pericardial effusion, pulmonary HTN, PAF, CKD, liver cirrhosis, hypothyroid, chronic normocytic anemia.   OT comments  Patient supine in bed and eager to participate in OT.  Pt completing bed mobility with min guard, transfer to/from [facility] with min assist to min guard (min boost from [facility] due to fatigue), mod assist for toileting due to decreased activity tolerance. She requires increased time for all activities, cueing for focused breathing and PLB. SpO2 on 4L via [place] during session at rest 96-98% with desaturation to 70% with minimal exertion and requires increased time to recover with cueing.  Pt reports not feeling ready for dc home yet, encouraged continued practice with transfers with nursing and PT today. Plans to have 24/7 support at dc, continue to recommend [facility].  Provided energy conservation handout.  Will follow.    Follow Up Recommendations  Home health OT;Supervision/Assistance - 24 hour    Equipment Recommendations  None recommended by OT    Recommendations for Other Services      Precautions / Restrictions Precautions Precautions: Fall;Other (comment) Precaution Comments: monitor vitals (BP and O2) Restrictions Weight Bearing Restrictions: No       Mobility Bed Mobility Overal bed mobility: Needs Assistance Bed Mobility: Supine to Sit;Sit to Supine     Supine to sit: Min guard Sit to supine: Min guard   General bed mobility comments: min guard for safety, cueing for PLB and increased time required    Transfers Overall transfer level: Needs assistance Equipment used: Rolling walker (2  wheeled) Transfers: Sit to/from [facility] Sit to Stand: Min assist;Min guard Stand pivot transfers: Min guard       General transfer comment: min guard from EOB, min assist from commde after fatigued; min guard to pivot using RW to/from [facility]    Balance Overall balance assessment: Needs assistance Sitting-balance support: No upper extremity supported;Feet supported Sitting balance-Leahy Scale: Fair     Standing balance support: Bilateral upper extremity supported;No upper extremity supported;Single extremity supported Standing balance-Leahy Scale: Poor Standing balance comment: requires UE support on RW                           ADL either performed or assessed with clinical judgement   ADL Overall ADL's : Needs assistance/impaired                         Toilet Transfer: Min guard;Stand-pivot;RW;BSC   Toileting- Clothing Manipulation and Hygiene: Moderate assistance;Sit to/from stand Toileting - Clothing Manipulation Details (indicate cue type and reason): assist for clothing mgmt due to decreased activity tolerance     Functional mobility during ADLs: Min guard;Rolling walker General ADL Comments: pt limited by decreased activity tolerance and endurance     Vision       Perception     Praxis      Cognition Arousal/Alertness: Awake/alert Behavior During Therapy: WFL for tasks assessed/performed Overall Cognitive Status: Within Functional Limits for tasks assessed  Exercises     Shoulder Instructions       General Comments pt on 4L O2 via [place] upon entry, at rest SPO2 maintained 96-98% but with minmal exertion (pivot to BSC)SpO2 desaturation to 70% with cueing for PLB throughout and focused breathing only.  SpO2 rebounded within 4 minutes to >90%, but desaturated again with return to bed (only to 80% this time).    Pertinent Vitals/ Pain       Pain Assessment:  No/denies pain  Home Living                                          Prior Functioning/Environment              Frequency  Min 2X/week        Progress Toward Goals  OT Goals(current goals can now be found in the care plan section)  Progress towards OT goals: Progressing toward goals  Acute Rehab OT Goals Patient Stated Goal: to go home when her breathing is better OT Goal Formulation: With patient  Plan Discharge plan remains appropriate;Frequency remains appropriate    Co-evaluation                 AM-PAC OT "6 Clicks" Daily Activity     Outcome Measure   Help from another person eating meals?: None Help from another person taking care of personal grooming?: A Little Help from another person toileting, which includes using toliet, bedpan, or urinal?: A Little Help from another person bathing (including washing, rinsing, drying)?: A Little Help from another person to put on and taking off regular upper body clothing?: A Little Help from another person to put on and taking off regular lower body clothing?: A Little 6 Click Score: 19    End of Session Equipment Utilized During Treatment: Rolling walker;Oxygen (4L)  OT Visit Diagnosis: Muscle weakness (generalized) (M62.81);Unsteadiness on feet (R26.81);Other (comment) (decreased activity tolerance)   Activity Tolerance Patient tolerated treatment well   Patient Left in bed;with call bell/phone within reach;with bed alarm set   Nurse Communication Mobility status        Time: 0223-3612 OT Time Calculation (min): 24 min  Charges: OT General Charges $OT Visit: 1 Visit OT Treatments $Self Care/Home Management : 23-37 mins  Jolaine Artist, OT Acute Rehabilitation Services Pager [phone] Office [place] 05/07/2021, 9:41 AM

## 2021-05-07 NOTE — Progress Notes (Signed)
Physical Therapy Treatment Patient Details Name: Linda Stevens MRN: [id number] DOB: [birth year] Today's Date: 05/07/2021    History of Present Illness Pt is a 75 y.o. female admitted 05/03/21 with worsening SOB, cough and DOE over the past week. Workup for suspected multifocal PNA, acute on chronic CHF, possible COPD exacerbation. PMH includes severe pulmonary HTN, CHF, COPD (4L O2 baseline), HTN, afib.   PT Comments    Pt progressing well with mobility. Today's session focused on transfer and gait training with RW; pt moving well at supervision-level, but limited by significant anxiety related to SOB with mobility. Increased time discussing strategies to overcome this with activity, as well as educ re: energy conservation, activity recommendations, other need with regards to potential d/c home tomorrow. Pt will have necessary assist from family; would benefit from some sort of w/c to mobilize longer distances. If to remain admitted, will continue to follow acutely.  SpO2 down to 84% on 4L O2 with activity (when pleth reliable); good recover to >/90% with seated rest and pursed lip breathing    Follow Up Recommendations  Home health PT;Supervision - Intermittent     Equipment Recommendations  Wheelchair (measurements PT);Wheelchair cushion (measurements PT) (small-size)    Recommendations for Other Services       Precautions / Restrictions Precautions Precautions: Fall;Other (comment) Precaution Comments: Watch SpO2 (wears 4L O2 baseline) Restrictions Weight Bearing Restrictions: No    Mobility  Bed Mobility Overal bed mobility: Modified Independent Bed Mobility: Supine to Sit                Transfers Overall transfer level: Needs assistance Equipment used: Rolling walker (2 wheeled) Transfers: Sit to/from Stand Sit to Stand: Supervision         General transfer comment: Multiple sit<>stands from EOB, recliner and toilet to RW, supervision for  safety/lines  Ambulation/Gait Ambulation/Gait assistance: Supervision Gait Distance (Feet): 18 Feet (+ 14' + 14' + 28' + 28') Assistive device: Rolling walker (2 wheeled) Gait Pattern/deviations: Step-through pattern;Decreased stride length;Trunk flexed Gait velocity: Decreased   General Gait Details: Slow, steady gait with RW and supervision for safety/lines; distance limited by SOB, and pt's anxiety related to this; frequent seated rest breaks to recover breathing   Stairs             Wheelchair Mobility    Modified Rankin (Stroke Patients Only)       Balance Overall balance assessment: Needs assistance Sitting-balance support: No upper extremity supported;Feet supported Sitting balance-Leahy Scale: Good     Standing balance support: Bilateral upper extremity supported;No upper extremity supported;During functional activity Standing balance-Leahy Scale: Fair Standing balance comment: Can static stand without UE support to pull up underwear; stability improved with RW                            Cognition Arousal/Alertness: Awake/alert Behavior During Therapy: WFL for tasks assessed/performed;Anxious Overall Cognitive Status: Within Functional Limits for tasks assessed                                 General Comments: WFL for simple tasks, except for very nervous related to SOB and mobility; responds well to encouragement      Exercises      General Comments General comments (skin integrity, edema, etc.): SpO2 down to 84% on 4L O2 [place] (when pleth reliable) during activity, recovering well with seated rest breaks  and cues for pursed lip breathing. Pt very nervous regarding mobility and SOB; educ on strategies for overcoming this; responding well to encouragement. Pt's husband present and supportive; hopeful for d/c tomorrow, reports no further questions or concerns      Pertinent Vitals/Pain Pain Assessment: No/denies pain Pain  Intervention(s): Monitored during session    Home Living                      Prior Function            PT Goals (current goals can now be found in the care plan section) Progress towards PT goals: Progressing toward goals    Frequency    Min 3X/week      PT Plan Current plan remains appropriate    Co-evaluation              AM-PAC PT "6 Clicks" Mobility   Outcome Measure  Help needed turning from your back to your side while in a flat bed without using bedrails?: None Help needed moving from lying on your back to sitting on the side of a flat bed without using bedrails?: None Help needed moving to and from a bed to a chair (including a wheelchair)?: A Little Help needed standing up from a chair using your arms (e.g., wheelchair or bedside chair)?: A Little Help needed to walk in hospital room?: A Little Help needed climbing 3-5 steps with a railing? : A Lot 6 Click Score: 19    End of Session Equipment Utilized During Treatment: Gait belt;Oxygen Activity Tolerance: Patient tolerated treatment well Patient left: in chair;with call bell/phone within reach;with family/visitor present Nurse Communication: Mobility status;Other (comment) (ok for husband to assist pt back to bed) PT Visit Diagnosis: Muscle weakness (generalized) (M62.81);Difficulty in walking, not elsewhere classified (R26.2)     Time: 1456-1540 PT Time Calculation (min) (ACUTE ONLY): 44 min  Charges:  $Gait Training: 8-22 mins $Therapeutic Activity: 8-22 mins $Self Care/Home Management: [place], PT, DPT Acute Rehabilitation Services  Pager [phone] Office [place] 05/07/2021, 4:18 PM

## 2021-05-07 NOTE — Progress Notes (Signed)
PROGRESS NOTE    Linda Stevens  DUK:[id number] DOB: 1945/11/26 DOA: 05/03/2021 PCP: [name], MD   Chief Complain: Shortness of breath  Brief Narrative: Patient is a 75 year old female with history of severe pulmonary hypertension, chronic diastolic congestive heart failure, COPD Gold stage III, chronic hypoxic respiratory failure on 4 L of oxygen, CKD stage IIIb who presented with shortness of breath for a week.  She also reported cough, exertional dyspnea.  She follows with cardiology for history of severe pulmonary hypertension, diastolic congestive heart failure.  Takes Lasix at home.  On presentation she was borderline hypotensive so given 500 mL of bolus of  IV fluid but blood pressure remained low.  Admitted for CHF exaceration.  CHF team  consulted and  were following,now signed off.  Plan for discharge tomorrow to home with home health  Assessment & Plan:   Active Problems:   Chronic diastolic CHF (congestive heart failure) (HCC)   CHF (congestive heart failure) (HCC)   Acute on chronic diastolic congestive heart failure: Presented with symptoms of cough, dyspnea on exertion, hypotension.  CHF team following.  Chest imaging showed bilateral pleural effusion.  Elevated JVP.  Echo done during this hospitalization showed EF of 60 to 65%, no pericardial effusion.Chest CT showed pulmonary congestion, pleural effusion, small pericardial effusion.  Given a dose of 60 mg Lasix IV once,now on hold  Acute COPD exacerbation: Possibility of concurrent COPD exacerbation.  Currently on DuoNeb,prednisone.  Now free of wheezing  Suspected multifocal pneumonia:CT chest showed numerous scattered, ill-defined ground-glass and heterogeneous airspace opacities throughout the lungs, consistent with multifocal pneumonia.  Started on ceftriaxone and azithromycin.  Patient does not have fever, no leukocytosis.  We will change antibiotics to oral on discharge  Acute on chronic hypoxic respiratory  failure: Secondary to CHF/COPD: On 4 L of oxygen per minute at home,now at baseline  Hypertension: BP soft.  Monitor blood pressure  History of paroxysmal A. fib: .  No anticoagulation due to history of GI bleed.  On amiodarone at home which is being planned to be discontinued due to pulmonary findings  History of severe pulmonary hypertension with signs of cor pulmonale: On Ambrisentan, tadafil.  Follows with cardiology.  CKD stage IIIb: Creatinine baseline is around 1.5.  Creatinine had trended up slightly to 1.9 ,now trending down again  Liver cirrhosis: As seen on CT.  Could be secondary to right heart failure.  Hepatitis serologies negative.  Hypothyroidism: Recently stopped taking Synthyroid,saying she cudnt tolerate it.  TSH is elevated at 8.9.  I recommended to be started on levothyroxine 50 mcg daily, but patient and her husband strictly denying to take it  Chronic normocytic anemia: Currently hemoglobin stable.  Has mild thrombocytopenia.Iron panel normal  Debility/deconditioning/weakness: We will have requested for PT/OT evaluation, recommended home health on discharge         DVT prophylaxis:heparin [place] Code Status: DNR Family Communication: Husband at bedside Status is: Inpatient  Remains inpatient appropriate because:Inpatient level of care appropriate due to severity of illness  Dispo: The patient is from: Home              Anticipated d/c is to: Home tomorrow              Patient currently is not medically stable to d/c.   Difficult to place patient No     Consultants: Cardiology  Procedures:None  Antimicrobials:  Anti-infectives (From admission, onward)    Start     Dose/Rate Route Frequency Ordered  Stop   05/05/21 1130  cefTRIAXone (ROCEPHIN) 2 g in sodium chloride 0.9 % 100 mL IVPB        2 g 200 mL/hr over 30 Minutes Intravenous Every 24 hours 05/05/21 1040     05/05/21 1130  azithromycin (ZITHROMAX) tablet 500 mg        500 mg Oral Daily 05/05/21  1040         Subjective:  Patient seen and examined at the bedside this morning.  Hemodynamically stable.  Overall comfortable.  She says she is not ready to go home today.  Still feeling weak.  We discussed about discharge planning tomorrow.  Objective: Vitals:   05/07/21 0250 05/07/21 0400 05/07/21 0759 05/07/21 0803  BP: (!) 104/45 (!) 100/46    Pulse: 78 70    Resp: 19 19    Temp: 98.1 F (36.7 C) 97.6 F (36.4 C)    TempSrc: Oral Oral    SpO2: 95% 99% 92% 92%  Weight:  61.3 kg    Height:        Intake/Output Summary (Last 24 hours) at 05/07/2021 1153 Last data filed at 05/06/2021 2158 Gross per 24 hour  Intake 560 ml  Output 200 ml  Net 360 ml   Filed Weights   05/05/21 0500 05/06/21 0500 05/07/21 0400  Weight: 61.8 kg 61.4 kg 61.3 kg    Examination:  General exam: Overall comfortable, not in distress, chronically ill looking, weak HEENT: PERRL Respiratory system: Diminished air sounds bilaterally, no wheezes or crackles  Cardiovascular system: S1 & S2 heard, RRR.  Gastrointestinal system: Abdomen is nondistended, soft and nontender. Central nervous system: Alert and oriented Extremities: No edema, no clubbing ,no cyanosis Skin: Scattered telangiectasia on face and bilateral upper extremities, no ulcers     Data Reviewed: I have personally reviewed following labs and imaging studies  CBC: Recent Labs  Lab 05/03/21 1008 05/03/21 1417 05/04/21 0406 05/05/21 0412  WBC 4.7  --  4.2 7.5  NEUTROABS 3.2  --   --   --   HGB 10.2* 9.5* 9.0* 8.6*  HCT 34.7* 28.0* 29.4* 28.0*  MCV 103.3*  --  101.4* 100.0  PLT 136*  --  141* 338*   Basic Metabolic Panel: Recent Labs  Lab 05/03/21 1008 05/03/21 1417 05/04/21 0406 05/05/21 0412 05/06/21 0328 05/06/21 0913 05/07/21 0242  NA 141 141 138 139 138  --  140  K 4.7 4.7 5.1 4.9 5.6* 4.8 4.9  CL 102  --  102 102 99  --  101  CO2 35*  --  _0 --  29  GLUCOSE 94  --  139* 117* 112*  --  100*  BUN 24*  --   33* 45* 47*  --  41*  CREATININE 1.51*  --  1.91* 1.93* 1.96*  --  1.77*  CALCIUM 8.5*  --  8.0* 8.4* 8.3*  --  8.4*   GFR: Estimated Creatinine Clearance: 23.4 mL/min (A) (by C-G formula based on SCr of 1.77 mg/dL (H)). Liver Function Tests: Recent Labs  Lab 05/03/21 1330  AST 18  ALT 7  ALKPHOS 48  BILITOT 0.5  PROT 6.4*  ALBUMIN 2.6*   No results for input(s): LIPASE, AMYLASE in the last 168 hours. No results for input(s): AMMONIA in the last 168 hours. Coagulation Profile: No results for input(s): INR, PROTIME in the last 168 hours. Cardiac Enzymes: No results for input(s): CKTOTAL, CKMB, CKMBINDEX, TROPONINI in the last 168 hours. BNP (  last 3 results) No results for input(s): PROBNP in the last 8760 hours. HbA1C: No results for input(s): HGBA1C in the last 72 hours. CBG: No results for input(s): GLUCAP in the last 168 hours. Lipid Profile: No results for input(s): CHOL, HDL, LDLCALC, TRIG, CHOLHDL, LDLDIRECT in the last 72 hours. Thyroid Function Tests: No results for input(s): TSH, T4TOTAL, FREET4, T3FREE, THYROIDAB in the last 72 hours.  Anemia Panel: Recent Labs    05/06/21 0850  FERRITIN 162  TIBC 252  IRON 55   Sepsis Labs: No results for input(s): PROCALCITON, LATICACIDVEN in the last 168 hours.  Recent Results (from the past 240 hour(s))  Resp Panel by RT-PCR (Flu A&B, Covid) Nasopharyngeal Swab     Status: None   Collection Time: 05/03/21 10:01 AM   Specimen: Nasopharyngeal Swab; Nasopharyngeal(NP) swabs in vial transport medium  Result Value Ref Range Status   SARS Coronavirus 2 by RT PCR NEGATIVE NEGATIVE Final    Comment: (NOTE) SARS-CoV-2 target nucleic acids are NOT DETECTED.  The SARS-CoV-2 RNA is generally detectable in upper respiratory specimens during the acute phase of infection. The lowest concentration of SARS-CoV-2 viral copies this assay can detect is 138 copies/mL. A negative result does not preclude SARS-Cov-2 infection and  should not be used as the sole basis for treatment or other patient management decisions. A negative result may occur with  improper specimen collection/handling, submission of specimen other than nasopharyngeal swab, presence of viral mutation(s) within the areas targeted by this assay, and inadequate number of viral copies(<138 copies/mL). A negative result must be combined with clinical observations, patient history, and epidemiological information. The expected result is Negative.  Fact Sheet for Patients:  EntrepreneurPulse.com.au  Fact Sheet for Healthcare Providers:  IncredibleEmployment.be  This test is no t yet approved or cleared by the [place] FDA and  has been authorized for detection and/or diagnosis of SARS-CoV-2 by FDA under an Emergency Use Authorization (EUA). This EUA will remain  in effect (meaning this test can be used) for the duration of the COVID-19 declaration under Section 564(b)(1) of the Act, 21 U.S.C.section 360bbb-3(b)(1), unless the authorization is terminated  or revoked sooner.       Influenza A by PCR NEGATIVE NEGATIVE Final   Influenza B by PCR NEGATIVE NEGATIVE Final    Comment: (NOTE) The Xpert Xpress SARS-CoV-2/FLU/RSV plus assay is intended as an aid in the diagnosis of influenza from Nasopharyngeal swab specimens and should not be used as a sole basis for treatment. Nasal washings and aspirates are unacceptable for Xpert Xpress SARS-CoV-2/FLU/RSV testing.  Fact Sheet for Patients: EntrepreneurPulse.com.au  Fact Sheet for Healthcare Providers: IncredibleEmployment.be  This test is not yet approved or cleared by the [place] FDA and has been authorized for detection and/or diagnosis of SARS-CoV-2 by FDA under an Emergency Use Authorization (EUA). This EUA will remain in effect (meaning this test can be used) for the duration of the COVID-19 declaration  under Section 564(b)(1) of the Act, 21 U.S.C. section 360bbb-3(b)(1), unless the authorization is terminated or revoked.  Performed at [facility], [place] [address] [place], [place] [address]   Group A Strep by PCR     Status: None   Collection Time: 05/03/21 10:02 AM   Specimen: Nasopharyngeal Swab; Sterile Swab  Result Value Ref Range Status   Group A Strep by PCR NOT DETECTED NOT DETECTED Final    Comment: Performed at [facility], [place] [address], [place], [place] [address]  Radiology Studies: No results found.      Scheduled Meds:  ambrisentan  10 mg Oral Daily   amiodarone  100 mg Oral Daily   aspirin EC  81 mg Oral Daily   azithromycin  500 mg Oral Daily   budesonide (PULMICORT) nebulizer solution  0.5 mg Nebulization BID   ferrous sulfate  325 mg Oral BID WC   fluticasone furoate-vilanterol  1 puff Inhalation Daily   guaiFENesin  1,200 mg Oral BID   heparin  5,000 Units Subcutaneous Q12H   ipratropium-albuterol  3 mL Nebulization TID   ketoconazole  1 application Topical Daily   nystatin  5 mL Oral QID   pantoprazole  40 mg Oral Daily   predniSONE  40 mg Oral Q breakfast   tadalafil  40 mg Oral Daily   Continuous Infusions:  cefTRIAXone (ROCEPHIN)  IV 2 g (05/07/21 1137)     LOS: 4 days    Time spent:25 mins, More than 50% of that time was spent in counseling and/or coordination of care.      Shelly Coss, MD [facility] P8/30/2022, 11:53 AM

## 2021-05-07 NOTE — Care Management Important Message (Signed)
Important Message  Patient Details  Name: Linda Stevens MRN: [id number] Date of Birth: 12-18-1945   Medicare Important Message Given:  Yes     Shelda Altes 05/07/2021, 9:46 AM

## 2021-05-07 NOTE — TOC CM/SW Note (Addendum)
HF TOC CM spoke to husband, and discussed HH. He agrees with [facility] and provided information on [place]. Explained agency will call to arrange visits. Placed contact number on pt's dc instructions. Husband will bring pt's portable oxygen for home to dc. He has private duty caregivers to sit with her while he is at work. Has all needed DME at home. Declined wheelchair, states he has a transport chair he uses. Will need HH RN and PT orders with [facility]. Updated attending. [place], Heart Failure TOC CM [phone]

## 2021-05-08 DIAGNOSIS — J45909 Unspecified asthma, uncomplicated: Secondary | ICD-10-CM | POA: Diagnosis not present

## 2021-05-08 DIAGNOSIS — R062 Wheezing: Secondary | ICD-10-CM | POA: Diagnosis not present

## 2021-05-08 DIAGNOSIS — J449 Chronic obstructive pulmonary disease, unspecified: Secondary | ICD-10-CM | POA: Diagnosis not present

## 2021-05-08 DIAGNOSIS — J9601 Acute respiratory failure with hypoxia: Secondary | ICD-10-CM | POA: Diagnosis not present

## 2021-05-08 LAB — BASIC METABOLIC PANEL
Anion gap: 6 (ref 5–15)
BUN: 31 mg/dL — ABNORMAL HIGH (ref 8–23)
CO2: 30 mmol/L (ref 22–32)
Calcium: 8.3 mg/dL — ABNORMAL LOW (ref 8.9–10.3)
Chloride: 105 mmol/L (ref 98–111)
Creatinine, Ser: 1.48 mg/dL — ABNORMAL HIGH (ref 0.44–1.00)
GFR, Estimated: 37 mL/min — ABNORMAL LOW (ref >60)
Glucose, Bld: 91 mg/dL (ref 70–99)
Potassium: 4.2 mmol/L (ref 3.5–5.1)
Sodium: 141 mmol/L (ref 135–145)

## 2021-05-08 MED ORDER — GUAIFENESIN-DM 100-10 MG/5ML PO SYRP: 10.0000 mL | ORAL_SOLUTION | Freq: Four times a day (QID) | ORAL | 1 refills | Status: DC | PRN

## 2021-05-08 MED ORDER — GUAIFENESIN-DM 100-10 MG/5ML PO SYRP
10.0000 mL | ORAL_SOLUTION | Freq: Four times a day (QID) | ORAL | Status: DC
  Administered 2021-05-08: 10 mL via ORAL
  Filled 2021-05-08: qty 10

## 2021-05-08 MED ORDER — CEFDINIR 300 MG PO CAPS: 300.0000 mg | ORAL_CAPSULE | Freq: Two times a day (BID) | ORAL | 0 refills | Status: AC

## 2021-05-08 NOTE — TOC CM/SW Note (Signed)
HF TOC CM notified [facility] rep, Stacie of scheduled dc home today with [place]. [place], Heart Failure TOC CM [phone]

## 2021-05-08 NOTE — Progress Notes (Signed)
Physical Therapy Treatment Patient Details Name: Linda Stevens MRN: [id number] DOB: 1946/08/31 Today's Date: 05/08/2021    History of Present Illness Pt is a 75 y.o. female admitted 05/03/21 with worsening SOB, cough and DOE over the past week. Workup for suspected multifocal PNA, acute on chronic CHF, possible COPD exacerbation. PMH includes severe pulmonary HTN, CHF, COPD (4L O2 baseline), HTN, afib.    PT Comments    Pt and husband in room on entry, eager to discharge home, with encouragement from husband pt agrees to working with PT prior to discharge. Pt is much more confident in her abilities today. With relaxed breathing techniques, pt able to ambulate to and from door 5x while maintaining SaO2 >88%O2. Pt requires supervision for safety and assist for management of oxygen tank. D/c plans remain appropriate at this time to continue to progress safe mobility in her home environment.     Follow Up Recommendations  Home health PT;Supervision - Intermittent     Equipment Recommendations  Wheelchair (measurements PT);Wheelchair cushion (measurements PT) (small-size)       Precautions / Restrictions Precautions Precautions: Fall;Other (comment) Precaution Comments: Watch SpO2 (wears 4L O2 baseline) Restrictions Weight Bearing Restrictions: No    Mobility  Bed Mobility Overal bed mobility: Modified Independent                  Transfers Overall transfer level: Needs assistance Equipment used: Rolling walker (2 wheeled) Transfers: Sit to/from Stand Sit to Stand: Supervision            Ambulation/Gait Ambulation/Gait assistance: Supervision Gait Distance (Feet): 20 Feet (+18', +18', +20', +22', +25') Assistive device: Rolling walker (2 wheeled) Gait Pattern/deviations: Step-through pattern;Decreased stride length;Trunk flexed Gait velocity: Decreased Gait velocity interpretation: <1.31 ft/sec, indicative of household ambulator General Gait Details: Slow,  steady gait with RW and supervision for safety/lines; distance limited by SOB, and pt's anxiety related to this; frequent seated rest breaks to recover breathing         Balance Overall balance assessment: Needs assistance Sitting-balance support: No upper extremity supported;Feet supported Sitting balance-Leahy Scale: Good     Standing balance support: Bilateral upper extremity supported;No upper extremity supported;During functional activity Standing balance-Leahy Scale: Fair Standing balance comment: can static stand without outside support, dynamic balance improved with UE support                            Cognition Arousal/Alertness: Awake/alert Behavior During Therapy: WFL for tasks assessed/performed;Anxious Overall Cognitive Status: Within Functional Limits for tasks assessed                                 General Comments: WFL for simple tasks, except for very nervous related to SOB and mobility; responds well to encouragement         General Comments General comments (skin integrity, edema, etc.): Pt much more confident with therapy today, able to recall breathing techniques from PT session yesterday, pt husband present, providing support and encouragement, pt able to take seated rest breaks with deep breathing and then return to ambulation with improved confidence. Pt on 4 L O2 via [place] with SaO2 dropping to 88%O2 with ambulation when good pleth waveform present, at rest SaO2 96-98%O2      Pertinent Vitals/Pain Pain Assessment: No/denies pain     PT Goals (current goals can now be found in the care plan section) Acute  Rehab PT Goals PT Goal Formulation: With patient Time For Goal Achievement: 05/18/21 Potential to Achieve Goals: Good Progress towards PT goals: Progressing toward goals    Frequency    Min 3X/week      PT Plan Current plan remains appropriate       AM-PAC PT "6 Clicks" Mobility   Outcome Measure  Help needed  turning from your back to your side while in a flat bed without using bedrails?: None Help needed moving from lying on your back to sitting on the side of a flat bed without using bedrails?: None Help needed moving to and from a bed to a chair (including a wheelchair)?: A Little Help needed standing up from a chair using your arms (e.g., wheelchair or bedside chair)?: A Little Help needed to walk in hospital room?: A Little Help needed climbing 3-5 steps with a railing? : A Lot 6 Click Score: 19    End of Session Equipment Utilized During Treatment: Gait belt;Oxygen Activity Tolerance: Patient tolerated treatment well Patient left: with family/visitor present;in bed;with call bell/phone within reach Nurse Communication: Mobility status PT Visit Diagnosis: Muscle weakness (generalized) (M62.81);Difficulty in walking, not elsewhere classified (R26.2)     Time: 7121-9758 PT Time Calculation (min) (ACUTE ONLY): 25 min  Charges:  $Gait Training: 23-37 mins                     Kairyn Olmeda B. Migdalia Dk PT, DPT Acute Rehabilitation Services Pager [phone] Office [phone]    [place] 05/08/2021, 12:25 PM

## 2021-05-08 NOTE — Discharge Summary (Signed)
Physician Discharge Summary  Linda Stevens PNT:[id number] DOB: Aug 20, 1946 DOA: 05/03/2021  PCP: [name], MD  Admit date: 05/03/2021 Discharge date: 05/08/2021  Admitted From: Home Disposition:  Home  Discharge Condition:Stable CODE STATUS:DNR Diet recommendation: Heart Healthy   Brief/Interim Summary: Patient is a 75 year old female with history of severe pulmonary hypertension, chronic diastolic congestive heart failure, COPD Gold stage III, chronic hypoxic respiratory failure on 4 L of oxygen, CKD stage IIIb who presented with shortness of breath for a week.  She also reported cough, exertional dyspnea.  She follows with cardiology for history of severe pulmonary hypertension, diastolic congestive heart failure.  Takes Lasix at home. She was admitted for CHF exaceration.  CHF team  consulted and  were following,now signed off. She remains euvolemic.  PT/OT recommend home health.  Plan for discharge today to home with home health.  Following problems were addressed during his hospitalization:  Acute on chronic diastolic congestive heart failure: Presented with symptoms of cough, dyspnea on exertion, hypotension.  CHF team following.  Chest imaging showed bilateral pleural effusion.  Elevated JVP.  Echo done during this hospitalization showed EF of 60 to 65%, no pericardial effusion.Chest CT showed pulmonary congestion, pleural effusion, small pericardial effusion.  Given a dose of 60 mg Lasix IV once,now on hold.  She takes Lasix 40 mg daily at home which we will continue.  She will follow-up with heart failure clinic on 05/16/2021   Acute COPD exacerbation: Possibility of concurrent COPD exacerbation.  Treated with  DuoNeb,prednisone.  Now free of wheezing   Suspected multifocal pneumonia:CT chest showed numerous scattered, ill-defined ground-glass and heterogeneous airspace opacities throughout the lungs, consistent with multifocal pneumonia.  Started on ceftriaxone and azithromycin.   Patient does not have fever, no leukocytosis.  We will change antibiotics to oral on discharge   Acute on chronic hypoxic respiratory failure: Secondary to CHF/COPD: On 4 L of oxygen per minute at home,now at baseline   Hypertension: BP  stable   History of paroxysmal A. fib: .  No anticoagulation due to history of GI bleed.  On amiodarone at home which is being planned to be discontinued due to pulmonary findings after outpatient appointment   History of severe pulmonary hypertension with signs of cor pulmonale: On Ambrisentan, tadafil.  Follows with cardiology.   CKD stage IIIb: Creatinine baseline is around 1.5.  Kidney function at baseline   Liver cirrhosis: As seen on CT.  Could be secondary to right heart failure.  Hepatitis serologies negative.   Hypothyroidism: Recently stopped taking Synthyroid,saying she cudnt tolerate it.  TSH is elevated at 8.9.  I recommended to be started on levothyroxine 50 mcg daily, but patient and her husband strictly denying to take it   Chronic normocytic anemia: Currently hemoglobin stable.  Has mild thrombocytopenia.Iron panel normal   Debility/deconditioning/weakness: Done with PT/OT evaluation, recommended home health on discharge      Discharge Diagnoses:  Active Problems:   Chronic diastolic CHF (congestive heart failure) (HCC)   CHF (congestive heart failure) [facility])    Discharge Instructions  Discharge Instructions     Diet - low sodium heart healthy   Complete by: As directed    Discharge instructions   Complete by: As directed    1)Please take prescribed medications as instructed 2)Monitor your fluid intake, weight at home 3)You have an appointment with heart failure clinic on 05/16/2021 at 3 PM 4)Follow up with her PCP in 1 to 2 weeks   Increase activity slowly  Complete by: As directed       Allergies as of 05/08/2021       Reactions   Amoxicillin-pot Clavulanate Other (See Comments)   REACTION: gi upset Has patient had  a PCN reaction causing immediate rash, facial/tongue/throat swelling, SOB or lightheadedness with hypotension: No Has patient had a PCN reaction causing severe rash involving mucus membranes or skin necrosis: No Has patient had a PCN reaction that required hospitalization : No Has patient had a PCN reaction occurring within the last 10 years: No If all of the above answers are "NO", then may proceed with Cephalosporin use.   Cefdinir Other (See Comments)   REACTION: gi  upset   Singulair [montelukast Sodium] Itching      Moxifloxacin Rash        Medication List     STOP taking these medications    levothyroxine 75 MCG tablet Commonly known as: SYNTHROID       TAKE these medications    acetaminophen 325 MG tablet Commonly known as: TYLENOL Take 325 mg by mouth at bedtime as needed for moderate pain or headache.   acetaminophen 500 MG tablet Commonly known as: TYLENOL Take 500 mg by mouth every 4 (four) hours as needed for headache (pain).   albuterol 108 (90 Base) MCG/ACT inhaler Commonly known as: VENTOLIN HFA INHALE 2 PUFFS INTO THE LUNGS EVERY 6 HOURS AS NEEDED FOR WHEEZING/SHORTNESS OF BREATH What changed: See the new instructions.   ambrisentan 10 MG tablet Commonly known as: LETAIRIS Take 1 tablet (10 mg total) by mouth daily. Please schedule an appointment for further refills   amiodarone 100 MG tablet Commonly known as: PACERONE TAKE 1 TABLET BY MOUTH EVERY DAY   Artificial Tears 1.4 % ophthalmic solution Generic drug: polyvinyl alcohol Place 1 drop into both eyes daily as needed for dry eyes.   aspirin 81 MG EC tablet Take 1 tablet (81 mg total) by mouth daily.   cefdinir 300 MG capsule Commonly known as: OMNICEF Take 1 capsule (300 mg total) by mouth 2 (two) times daily for 2 days.   ciclopirox 8 % solution Commonly known as: Penlac Apply topically at bedtime. Apply over nail and surrounding skin. Apply daily over previous coat. After seven (7)  days, may remove with alcohol and continue cycle.   ferrous sulfate 325 (65 FE) MG EC tablet TAKE 1 TABLET BY MOUTH 2 TIMES DAILY WITH A MEAL.   furosemide 40 MG tablet Commonly known as: LASIX TAKE 1 TABLET BY MOUTH TWICE A DAY What changed:  when to take this additional instructions   guaiFENesin-dextromethorphan 100-10 MG/5ML syrup Commonly known as: ROBITUSSIN DM Take 10 mLs by mouth every 6 (six) hours as needed for cough.   ketoconazole 2 % cream Commonly known as: NIZORAL Apply 1 application topically daily.   Klor-Con M20 20 MEQ tablet Generic drug: potassium chloride SA TAKE 1 TABLET BY MOUTH EVERY DAY What changed: how much to take   OXYGEN Inhale 4 L into the lungs continuous.   pantoprazole 40 MG tablet Commonly known as: PROTONIX TAKE 1 TABLET BY MOUTH EVERY DAY   spironolactone 25 MG tablet Commonly known as: ALDACTONE TAKE 1/2 TABLET BY MOUTH EVERY DAY   tadalafil 20 MG tablet Commonly known as: Cialis Take 2 tablets (40 mg total) by mouth daily.        Follow-up Information     [facility] Follow up on 05/16/2021.   Specialty: Cardiology Why:  Advanced Heart Failure Clinic at [facility] at 3 pm Entrance C, Garage Code 1607 Contact information: [address] [phone] [place], [place] Follow up.   Specialty: Home Health Services Why: Home Health RN and Physical Therapy-agency will call to arrange visits Contact information: [address] [place] [address] [phone]         [name], MD. Schedule an appointment as soon as possible for a visit in 1 month(s).   Specialty: Family Medicine Contact information: [place] [address] [phone]                Allergies  Allergen Reactions   Amoxicillin-Pot Clavulanate Other (See Comments)    REACTION: gi upset Has  patient had a PCN reaction causing immediate rash, facial/tongue/throat swelling, SOB or lightheadedness with hypotension: No Has patient had a PCN reaction causing severe rash involving mucus membranes or skin necrosis: No Has patient had a PCN reaction that required hospitalization : No Has patient had a PCN reaction occurring within the last 10 years: No If all of the above answers are "NO", then may proceed with Cephalosporin use.    Cefdinir Other (See Comments)    REACTION: gi  upset   Singulair [Montelukast Sodium] Itching        Moxifloxacin Rash    Consultations: Cardiology   Procedures/Studies: CT CHEST WO CONTRAST  Result Date: 05/03/2021 CLINICAL DATA:  Abnormal chest radiographs, pleural effusions EXAM: CT CHEST WITHOUT CONTRAST TECHNIQUE: Multidetector CT imaging of the chest was performed following the standard protocol without IV contrast. COMPARISON:  Same-day chest radiographs, CT chest angiogram, 02/19/2020 FINDINGS: Cardiovascular: Aortic atherosclerosis. Cardiomegaly. Three-vessel coronary artery calcifications. Gross enlargement of the main pulmonary artery measuring up to 4.0 cm in caliber. Small pericardial effusion, diminished compared to prior examination. Mediastinum/Nodes: Unchanged prominent mediastinal and hilar lymph nodes. Thyroid gland, trachea, and esophagus demonstrate no significant findings. Lungs/Pleura: Moderate centrilobular emphysema. There are numerous scattered, somewhat ill-defined ground-glass and heterogeneous airspace opacities throughout the lungs, most concentrated in the left upper lobe (series 4, image 61, 76, 53). Unchanged small, loculated bilateral pleural effusions associated atelectasis or consolidation. Upper Abdomen: No acute abnormality. Coarse, nodular contour of the liver. Musculoskeletal: No chest wall mass or suspicious bone lesions identified. IMPRESSION: 1. There are numerous scattered, somewhat ill-defined ground-glass and  heterogeneous airspace opacities throughout the lungs, most concentrated in the left upper lobe, new compared to prior examination. Findings are most consistent with multifocal infection. 2. Unchanged small, loculated bilateral pleural effusions and associated atelectasis or consolidation. 3. Moderate centrilobular emphysema. 4. Cardiomegaly with small pericardial effusion, diminished compared to prior examination. 5. Gross enlargement of the main pulmonary artery, as can be seen with pulmonary hypertension. 6. Coronary artery disease. 7. Coarse, nodular contour of the liver in the included upper abdomen, suggestive of cirrhosis. Aortic Atherosclerosis (ICD10-I70.0) and Emphysema (ICD10-J43.9). Electronically Signed   By: Eddie Candle M.D.   On: 05/03/2021 16:06   DG Chest Port 1 View  Result Date: 05/03/2021 CLINICAL DATA:  75 year old female with history of worsening shortness of breath over the past week. Low blood pressure. EXAM: PORTABLE CHEST 1 VIEW COMPARISON:  Chest x-ray 06/05/2020. FINDINGS: Extensive ill-defined opacities and areas of interstitial prominence throughout the mid to lower lungs bilaterally, most severe in the lung bases. Small bilateral pleural effusions (right greater than left). Mild cephalization of the pulmonary vasculature.  No pneumothorax. Mild cardiomegaly. Upper mediastinal contours are within normal limits. Atherosclerotic calcifications in the thoracic aorta. IMPRESSION: 1. The appearance of the chest suggests congestive heart failure, as above. 2. Bibasilar opacities likely reflect areas of subsegmental atelectasis, however, underlying airspace consolidation is difficult to entirely exclude. 3. Aortic atherosclerosis. Electronically Signed   By: Vinnie Langton M.D.   On: 05/03/2021 10:50   ECHOCARDIOGRAM COMPLETE  Result Date: 05/04/2021    ECHOCARDIOGRAM REPORT   Patient Name:   SHALIMAR MCCLAIN Date of Exam: 05/04/2021 Medical Rec #:  [id number]           Height:        63.0 in Accession #:    [id number]          Weight:       135.1 lb Date of Birth:  05-01-1946           BSA:          1.637 m Patient Age:    70 years            BP:           94/40 mmHg Patient Gender: F                   HR:           77 bpm. Exam Location:  Inpatient Procedure: 2D Echo, Cardiac Doppler and Color Doppler Indications:    CHF-Acute Diastolic E75.17  History:        Patient has prior history of Echocardiogram examinations, most                 recent 07/13/2020. CHF, COPD and Pulmonary HTN; Risk                 Factors:Former Smoker and Hypertension.  Sonographer:    Bernadene Person RDCS Referring Phys: [id number] Geraldine  1. Left ventricular ejection fraction, by estimation, is 60 to 65%. The left ventricle has normal function. The left ventricle has no regional wall motion abnormalities. Left ventricular diastolic parameters are indeterminate.  2. Right ventricular systolic function is normal. The right ventricular size is normal. Tricuspid regurgitation signal is inadequate for assessing PA pressure.  3. The mitral valve is normal in structure. No evidence of mitral valve regurgitation. No evidence of mitral stenosis.  4. The aortic valve has an indeterminant number of cusps. Aortic valve regurgitation is not visualized. No aortic stenosis is present.  5. The inferior vena cava is dilated in size with >50% respiratory variability, suggesting right atrial pressure of 8 mmHg. FINDINGS  Left Ventricle: Left ventricular ejection fraction, by estimation, is 60 to 65%. The left ventricle has normal function. The left ventricle has no regional wall motion abnormalities. The left ventricular internal cavity size was normal in size. There is  no left ventricular hypertrophy. Left ventricular diastolic parameters are indeterminate. Right Ventricle: The right ventricular size is normal. No increase in right ventricular wall thickness. Right ventricular systolic function is normal. Tricuspid  regurgitation signal is inadequate for assessing PA pressure. Left Atrium: Left atrial size was normal in size. Right Atrium: Right atrial size was normal in size. Pericardium: There is no evidence of pericardial effusion. Mitral Valve: The mitral valve is normal in structure. No evidence of mitral valve regurgitation. No evidence of mitral valve stenosis. Tricuspid Valve: The tricuspid valve is not well visualized. Tricuspid valve regurgitation is trivial. No evidence of tricuspid stenosis. Aortic Valve: The aortic valve  has an indeterminant number of cusps. Aortic valve regurgitation is not visualized. No aortic stenosis is present. Pulmonic Valve: The pulmonic valve was not well visualized. Pulmonic valve regurgitation is not visualized. No evidence of pulmonic stenosis. Aorta: The aortic root is normal in size and structure. Venous: The inferior vena cava is dilated in size with greater than 50% respiratory variability, suggesting right atrial pressure of 8 mmHg. IAS/Shunts: No atrial level shunt detected by color flow Doppler.  LEFT VENTRICLE PLAX 2D LVIDd:         4.00 cm  Diastology LVIDs:         2.40 cm  LV e' medial:    6.53 cm/s LV PW:         1.10 cm  LV E/e' medial:  19.3 LV IVS:        0.90 cm  LV e' lateral:   6.87 cm/s LVOT diam:     1.80 cm  LV E/e' lateral: 18.3 LV SV:         69 LV SV Index:   42 LVOT Area:     2.54 cm  RIGHT VENTRICLE RV S prime:     11.00 cm/s TAPSE (M-mode): 1.8 cm LEFT ATRIUM           Index       RIGHT ATRIUM           Index LA diam:      3.20 cm 1.96 cm/m  RA Area:     12.10 cm LA Vol (A2C): 32.5 ml 19.86 ml/m RA Volume:   21.40 ml  13.08 ml/m LA Vol (A4C): 44.0 ml 26.88 ml/m  AORTIC VALVE LVOT Vmax:   113.00 cm/s LVOT Vmean:  75.600 cm/s LVOT VTI:    0.273 m  AORTA Ao Root diam: 2.70 cm Ao Asc diam:  2.80 cm MITRAL VALVE MV Area (PHT): 2.42 cm     SHUNTS MV Decel Time: 313 msec     Systemic VTI:  0.27 m MV E velocity: 126.00 cm/s  Systemic Diam: 1.80 cm MV A  velocity: 95.60 cm/s MV E/A ratio:  1.32 Carlyle Dolly MD Electronically signed by Carlyle Dolly MD Signature Date/Time: 05/04/2021/11:04:39 AM    Final       Subjective: Patient seen and examined at the bedside this morning.  Hemodynamically stable for discharge.  Discharge Exam: Vitals:   05/08/21 0751 05/08/21 0752  BP:    Pulse:    Resp:    Temp:    SpO2: 94% 94%   Vitals:   05/08/21 0000 05/08/21 0400 05/08/21 0751 05/08/21 0752  BP: (!) 101/40 (!) 100/42    Pulse: 68 68    Resp: 20 19    Temp: 98.5 F (36.9 C) 98.9 F (37.2 C)    TempSrc: Oral Oral    SpO2: 98% 96% 94% 94%  Weight: 64.3 kg     Height:        General: Pt is alert, awake, not in acute distress Cardiovascular: RRR, S1/S2 +, no rubs, no gallops Respiratory: CTA bilaterally, no wheezing, no rhonchi Abdominal: Soft, NT, ND, bowel sounds + Extremities: no edema, no cyanosis    The results of significant diagnostics from this hospitalization (including imaging, microbiology, ancillary and laboratory) are listed below for reference.     Microbiology: Recent Results (from the past 240 hour(s))  Resp Panel by RT-PCR (Flu A&B, Covid) Nasopharyngeal Swab     Status: None   Collection Time: 05/03/21 10:01 AM  Specimen: Nasopharyngeal Swab; Nasopharyngeal(NP) swabs in vial transport medium  Result Value Ref Range Status   SARS Coronavirus 2 by RT PCR NEGATIVE NEGATIVE Final    Comment: (NOTE) SARS-CoV-2 target nucleic acids are NOT DETECTED.  The SARS-CoV-2 RNA is generally detectable in upper respiratory specimens during the acute phase of infection. The lowest concentration of SARS-CoV-2 viral copies this assay can detect is 138 copies/mL. A negative result does not preclude SARS-Cov-2 infection and should not be used as the sole basis for treatment or other patient management decisions. A negative result may occur with  improper specimen collection/handling, submission of specimen other than  nasopharyngeal swab, presence of viral mutation(s) within the areas targeted by this assay, and inadequate number of viral copies(<138 copies/mL). A negative result must be combined with clinical observations, patient history, and epidemiological information. The expected result is Negative.  Fact Sheet for Patients:  EntrepreneurPulse.com.au  Fact Sheet for Healthcare Providers:  IncredibleEmployment.be  This test is no t yet approved or cleared by the [place] FDA and  has been authorized for detection and/or diagnosis of SARS-CoV-2 by FDA under an Emergency Use Authorization (EUA). This EUA will remain  in effect (meaning this test can be used) for the duration of the COVID-19 declaration under Section 564(b)(1) of the Act, 21 U.S.C.section 360bbb-3(b)(1), unless the authorization is terminated  or revoked sooner.       Influenza A by PCR NEGATIVE NEGATIVE Final   Influenza B by PCR NEGATIVE NEGATIVE Final    Comment: (NOTE) The Xpert Xpress SARS-CoV-2/FLU/RSV plus assay is intended as an aid in the diagnosis of influenza from Nasopharyngeal swab specimens and should not be used as a sole basis for treatment. Nasal washings and aspirates are unacceptable for Xpert Xpress SARS-CoV-2/FLU/RSV testing.  Fact Sheet for Patients: EntrepreneurPulse.com.au  Fact Sheet for Healthcare Providers: IncredibleEmployment.be  This test is not yet approved or cleared by the [place] FDA and has been authorized for detection and/or diagnosis of SARS-CoV-2 by FDA under an Emergency Use Authorization (EUA). This EUA will remain in effect (meaning this test can be used) for the duration of the COVID-19 declaration under Section 564(b)(1) of the Act, 21 U.S.C. section 360bbb-3(b)(1), unless the authorization is terminated or revoked.  Performed at [facility], [place] [address], [place], [place] [address]    Group A Strep by PCR     Status: None   Collection Time: 05/03/21 10:02 AM   Specimen: Nasopharyngeal Swab; Sterile Swab  Result Value Ref Range Status   Group A Strep by PCR NOT DETECTED NOT DETECTED Final    Comment: Performed at [facility], [place] [address] [place], [place] [address]     Labs: BNP (last 3 results) Recent Labs    07/02/20 1441 11/02/20 1413 05/03/21 1105  BNP 344.5* 448.2* 333.5*   Basic Metabolic Panel: Recent Labs  Lab 05/04/21 0406 05/05/21 0412 05/06/21 0328 05/06/21 0913 05/07/21 0242 05/08/21 0318  NA 138 139 138  --  140 141  K 5.1 4.9 5.6* 4.8 4.9 4.2  CL 102 102 99  --  101 105  CO2 _0 --  29 30  GLUCOSE 139* 117* 112*  --  100* 91  BUN 33* 45* 47*  --  41* 31*  CREATININE 1.91* 1.93* 1.96*  --  1.77* 1.48*  CALCIUM 8.0* 8.4* 8.3*  --  8.4* 8.3*   Liver Function Tests: Recent Labs  Lab 05/03/21 1330  AST 18  ALT 7  ALKPHOS 48  BILITOT 0.5  PROT 6.4*  ALBUMIN 2.6*   No results for input(s): LIPASE, AMYLASE in the last 168 hours. No results for input(s): AMMONIA in the last 168 hours. CBC: Recent Labs  Lab 05/03/21 1008 05/03/21 1417 05/04/21 0406 05/05/21 0412  WBC 4.7  --  4.2 7.5  NEUTROABS 3.2  --   --   --   HGB 10.2* 9.5* 9.0* 8.6*  HCT 34.7* 28.0* 29.4* 28.0*  MCV 103.3*  --  101.4* 100.0  PLT 136*  --  141* 146*   Cardiac Enzymes: No results for input(s): CKTOTAL, CKMB, CKMBINDEX, TROPONINI in the last 168 hours. BNP: Invalid input(s): POCBNP CBG: No results for input(s): GLUCAP in the last 168 hours. D-Dimer No results for input(s): DDIMER in the last 72 hours. Hgb A1c No results for input(s): HGBA1C in the last 72 hours. Lipid Profile No results for input(s): CHOL, HDL, LDLCALC, TRIG, CHOLHDL, LDLDIRECT in the last 72 hours. Thyroid function studies No results for input(s): TSH, T4TOTAL, T3FREE, THYROIDAB in the last 72 hours.  Invalid input(s): FREET3 Anemia work up Recent Labs     05/06/21 0850  FERRITIN 162  TIBC 252  IRON 55   Urinalysis    Component Value Date/Time   COLORURINE YELLOW 05/05/2021 2154   APPEARANCEUR HAZY (A) 05/05/2021 2154   LABSPEC 1.021 05/05/2021 2154   PHURINE 5.0 05/05/2021 2154   GLUCOSEU NEGATIVE 05/05/2021 2154   GLUCOSEU NEGATIVE 07/07/2017 1625   HGBUR NEGATIVE 05/05/2021 2154   HGBUR negative 12/22/2007 [place] 05/05/2021 2154   KETONESUR NEGATIVE 05/05/2021 2154   PROTEINUR NEGATIVE 05/05/2021 2154   UROBILINOGEN 0.2 07/07/2017 1625   NITRITE NEGATIVE 05/05/2021 2154   LEUKOCYTESUR TRACE (A) 05/05/2021 2154   Sepsis Labs Invalid input(s): PROCALCITONIN,  WBC,  LACTICIDVEN Microbiology Recent Results (from the past 240 hour(s))  Resp Panel by RT-PCR (Flu A&B, Covid) Nasopharyngeal Swab     Status: None   Collection Time: 05/03/21 10:01 AM   Specimen: Nasopharyngeal Swab; Nasopharyngeal(NP) swabs in vial transport medium  Result Value Ref Range Status   SARS Coronavirus 2 by RT PCR NEGATIVE NEGATIVE Final    Comment: (NOTE) SARS-CoV-2 target nucleic acids are NOT DETECTED.  The SARS-CoV-2 RNA is generally detectable in upper respiratory specimens during the acute phase of infection. The lowest concentration of SARS-CoV-2 viral copies this assay can detect is 138 copies/mL. A negative result does not preclude SARS-Cov-2 infection and should not be used as the sole basis for treatment or other patient management decisions. A negative result may occur with  improper specimen collection/handling, submission of specimen other than nasopharyngeal swab, presence of viral mutation(s) within the areas targeted by this assay, and inadequate number of viral copies(<138 copies/mL). A negative result must be combined with clinical observations, patient history, and epidemiological information. The expected result is Negative.  Fact Sheet for Patients:  EntrepreneurPulse.com.au  Fact Sheet  for Healthcare Providers:  IncredibleEmployment.be  This test is no t yet approved or cleared by the [place] FDA and  has been authorized for detection and/or diagnosis of SARS-CoV-2 by FDA under an Emergency Use Authorization (EUA). This EUA will remain  in effect (meaning this test can be used) for the duration of the COVID-19 declaration under Section 564(b)(1) of the Act, 21 U.S.C.section 360bbb-3(b)(1), unless the authorization is terminated  or revoked sooner.       Influenza A by PCR NEGATIVE NEGATIVE Final   Influenza  B by PCR NEGATIVE NEGATIVE Final    Comment: (NOTE) The Xpert Xpress SARS-CoV-2/FLU/RSV plus assay is intended as an aid in the diagnosis of influenza from Nasopharyngeal swab specimens and should not be used as a sole basis for treatment. Nasal washings and aspirates are unacceptable for Xpert Xpress SARS-CoV-2/FLU/RSV testing.  Fact Sheet for Patients: EntrepreneurPulse.com.au  Fact Sheet for Healthcare Providers: IncredibleEmployment.be  This test is not yet approved or cleared by the [place] FDA and has been authorized for detection and/or diagnosis of SARS-CoV-2 by FDA under an Emergency Use Authorization (EUA). This EUA will remain in effect (meaning this test can be used) for the duration of the COVID-19 declaration under Section 564(b)(1) of the Act, 21 U.S.C. section 360bbb-3(b)(1), unless the authorization is terminated or revoked.  Performed at [facility], [place] [address], [place], [place] [address]   Group A Strep by PCR     Status: None   Collection Time: 05/03/21 10:02 AM   Specimen: Nasopharyngeal Swab; Sterile Swab  Result Value Ref Range Status   Group A Strep by PCR NOT DETECTED NOT DETECTED Final    Comment: Performed at [facility], [place] [address], [place], [place] [address]    Please note: You were cared for by a hospitalist during your hospital  stay. Once you are discharged, your primary care physician will handle any further medical issues. Please note that NO REFILLS for any discharge medications will be authorized once you are discharged, as it is imperative that you return to your primary care physician (or establish a relationship with a primary care physician if you do not have one) for your post hospital discharge needs so that they can reassess your need for medications and monitor your lab values.    Time coordinating discharge: 40 minutes  SIGNED:   Shelly Coss, MD  [facility] 05/08/2021, 11:37 AM Pager [phone]  If 7PM-7AM, please contact night-coverage [email or web address] Password [email or web address]

## 2021-05-08 NOTE — Progress Notes (Signed)
Occupational Therapy Treatment Patient Details Name: Linda Stevens MRN: [id number] DOB: 1945/12/23 Today's Date: 05/08/2021    History of present illness Pt is a 75 y.o. female admitted 05/03/21 with worsening SOB, cough and DOE over the past week. Workup for suspected multifocal PNA, acute on chronic CHF, possible COPD exacerbation. PMH includes severe pulmonary HTN, CHF, COPD (4L O2 baseline), HTN, afib.   OT comments  Pt and husband in room on entry, reporting awaiting discharge home.  Pt declined any OOB activity due to fatigue from PT session and having just finished lunch.  Therapist educated pt on energy conservation strategies and AE and breathing techniques to utilize during mobility and self-care tasks.  Pt reports feeling more confident today and doing well with mobility during PT session.  Pt with episodes of labored breathing, but O2 sats remained 93-94% on 4L.  Pt will require Supervision with mobility and self-care tasks, reports husband and adult daughters able to assist.  Follow Up Recommendations  Home health OT;Supervision/Assistance - 24 hour    Equipment Recommendations  None recommended by OT    Recommendations for Other Services      Precautions / Restrictions Precautions Precautions: Fall;Other (comment) Precaution Comments: Watch SpO2 (wears 4L O2 baseline) Restrictions Weight Bearing Restrictions: No        ADL either performed or assessed with clinical judgement      Cognition Arousal/Alertness: Awake/alert Behavior During Therapy: [facility] for tasks assessed/performed;Anxious Overall Cognitive Status: Within Functional Limits for tasks assessed                                 General Comments: Very nervous related to SOB and mobility; responds well to encouragement                   Pertinent Vitals/ Pain       Pain Assessment: No/denies pain Pain Intervention(s): Monitored during session         Frequency  Min 2X/week         Progress Toward Goals  OT Goals(current goals can now be found in the care plan section)  Progress towards OT goals: Progressing toward goals  Acute Rehab OT Goals Patient Stated Goal: to go home OT Goal Formulation: With patient Time For Goal Achievement: 05/19/21 Potential to Achieve Goals: Good  Plan Discharge plan remains appropriate;Frequency remains appropriate       AM-PAC OT "6 Clicks" Daily Activity     Outcome Measure   Help from another person eating meals?: None Help from another person taking care of personal grooming?: A Little Help from another person toileting, which includes using toliet, bedpan, or urinal?: A Little Help from another person bathing (including washing, rinsing, drying)?: A Little Help from another person to put on and taking off regular upper body clothing?: A Little Help from another person to put on and taking off regular lower body clothing?: A Little 6 Click Score: 19    End of Session Equipment Utilized During Treatment: Oxygen (4L)  OT Visit Diagnosis: Muscle weakness (generalized) (M62.81);Unsteadiness on feet (R26.81);Other (comment) (decreased activity tolerance)   Activity Tolerance Patient tolerated treatment well   Patient Left in bed;with call bell/phone within reach;with bed alarm set;with family/visitor present   Nurse Communication  (awaiting d/c info)        Time: 9629-5284 OT Time Calculation (min): 16 min  Charges: OT General Charges $OT Visit: 1 Visit OT Treatments $Self  Care/Home Management : 8-22 mins   Simonne Come [phone] 05/08/2021, 1:18 PM

## 2021-05-09 ENCOUNTER — Telehealth: Payer: Self-pay | Admitting: *Deleted

## 2021-05-09 ENCOUNTER — Other Ambulatory Visit: Payer: Self-pay

## 2021-05-09 DIAGNOSIS — I5032 Chronic diastolic (congestive) heart failure: Secondary | ICD-10-CM

## 2021-05-09 DIAGNOSIS — I4891 Unspecified atrial fibrillation: Secondary | ICD-10-CM

## 2021-05-09 DIAGNOSIS — J449 Chronic obstructive pulmonary disease, unspecified: Secondary | ICD-10-CM

## 2021-05-09 NOTE — Chronic Care Management (AMB) (Signed)
  Chronic Care Management   Outreach Note  05/09/2021 Name: [name] MRN: [id number] DOB: June 15, 1946  Linda Stevens is a 75 y.o. year old female who is a primary care patient of Bedsole, Amy E, MD. I reached out to Linda Stevens by phone today in response to a referral sent by Ms. Driscilla Moats Huynh's PCP [name], MD     An unsuccessful telephone outreach was attempted today. The patient was referred to the case management team for assistance with care management and care coordination.   Follow Up Plan: A HIPAA compliant phone message was left for the patient providing contact information and requesting a return call.  If patient returns call to provider office, please advise to call Embedded Care Management Care Guide Jisela Merlino at [place] [place] [facility]  Direct Dial: [phone]

## 2021-05-09 NOTE — Patient Outreach (Signed)
[facility]) Care Management  05/09/2021  Linda Stevens Oct 16, 1945 [id number]   [place] [facility] [ACO] Patient: Linda Stevens Medicare  Primary Care Provider:  [name], MD [facility]  Patient screened for hospitalization to assess for potential [facility] service needs for post hospital transition.  Review of patient's medical record reveals patient is in a provider practice that has an Embedded Chronic Care Management program and listed for the [facility] follow up.   Plan:  Referral to be sent for CCM assessment for program.    For questions contact:   Linda Brood, RN BSN [facility]iaison  [phone] business mobile phone Toll free office [phone]  Fax number: [phone] [email or web address]

## 2021-05-10 ENCOUNTER — Ambulatory Visit (INDEPENDENT_AMBULATORY_CARE_PROVIDER_SITE_OTHER): Payer: Medicare HMO | Admitting: Family Medicine

## 2021-05-10 ENCOUNTER — Other Ambulatory Visit: Payer: Self-pay

## 2021-05-10 ENCOUNTER — Encounter: Payer: Self-pay | Admitting: Family Medicine

## 2021-05-10 ENCOUNTER — Telehealth: Payer: Self-pay | Admitting: Family Medicine

## 2021-05-10 VITALS — BP 83/35 | HR 73 | Temp 97.7°F | Ht 63.5 in | Wt 138.2 lb

## 2021-05-10 DIAGNOSIS — J9621 Acute and chronic respiratory failure with hypoxia: Secondary | ICD-10-CM | POA: Diagnosis not present

## 2021-05-10 DIAGNOSIS — E039 Hypothyroidism, unspecified: Secondary | ICD-10-CM

## 2021-05-10 DIAGNOSIS — J9611 Chronic respiratory failure with hypoxia: Secondary | ICD-10-CM | POA: Diagnosis not present

## 2021-05-10 DIAGNOSIS — J189 Pneumonia, unspecified organism: Secondary | ICD-10-CM

## 2021-05-10 DIAGNOSIS — N183 Chronic kidney disease, stage 3 unspecified: Secondary | ICD-10-CM | POA: Diagnosis not present

## 2021-05-10 DIAGNOSIS — I10 Essential (primary) hypertension: Secondary | ICD-10-CM | POA: Diagnosis not present

## 2021-05-10 DIAGNOSIS — I2721 Secondary pulmonary arterial hypertension: Secondary | ICD-10-CM

## 2021-05-10 DIAGNOSIS — D631 Anemia in chronic kidney disease: Secondary | ICD-10-CM | POA: Diagnosis not present

## 2021-05-10 DIAGNOSIS — J439 Emphysema, unspecified: Secondary | ICD-10-CM | POA: Diagnosis not present

## 2021-05-10 DIAGNOSIS — J90 Pleural effusion, not elsewhere classified: Secondary | ICD-10-CM

## 2021-05-10 DIAGNOSIS — I509 Heart failure, unspecified: Secondary | ICD-10-CM

## 2021-05-10 DIAGNOSIS — I5032 Chronic diastolic (congestive) heart failure: Secondary | ICD-10-CM | POA: Diagnosis not present

## 2021-05-10 DIAGNOSIS — M1990 Unspecified osteoarthritis, unspecified site: Secondary | ICD-10-CM | POA: Diagnosis not present

## 2021-05-10 DIAGNOSIS — I130 Hypertensive heart and chronic kidney disease with heart failure and stage 1 through stage 4 chronic kidney disease, or unspecified chronic kidney disease: Secondary | ICD-10-CM | POA: Diagnosis not present

## 2021-05-10 DIAGNOSIS — I480 Paroxysmal atrial fibrillation: Secondary | ICD-10-CM | POA: Diagnosis not present

## 2021-05-10 DIAGNOSIS — Z87891 Personal history of nicotine dependence: Secondary | ICD-10-CM | POA: Diagnosis not present

## 2021-05-10 DIAGNOSIS — Z9981 Dependence on supplemental oxygen: Secondary | ICD-10-CM | POA: Diagnosis not present

## 2021-05-10 DIAGNOSIS — J9612 Chronic respiratory failure with hypercapnia: Secondary | ICD-10-CM | POA: Diagnosis not present

## 2021-05-10 DIAGNOSIS — I272 Pulmonary hypertension, unspecified: Secondary | ICD-10-CM | POA: Diagnosis not present

## 2021-05-10 DIAGNOSIS — I5033 Acute on chronic diastolic (congestive) heart failure: Secondary | ICD-10-CM | POA: Diagnosis not present

## 2021-05-10 DIAGNOSIS — K7460 Unspecified cirrhosis of liver: Secondary | ICD-10-CM | POA: Diagnosis not present

## 2021-05-10 NOTE — Progress Notes (Signed)
Patient ID: Linda Stevens, female    DOB: 1946-05-16, 75 y.o.   MRN: [id number]  This visit was conducted in person.  BP (!) 83/35   Pulse 73   Temp 97.7 F (36.5 C) (Temporal)   Ht 5' 3.5" (1.613 m)   Wt 138 lb 4 oz (62.7 kg)   SpO2 100% Comment: 4 L O2  BMI 24.11 kg/m    CC: Chief Complaint  Patient presents with   Hospitalization Follow-up    Subjective:   HPI: Linda Stevens is a 75 y.o. female presenting on 05/10/2021 for Hospitalization Follow-up   Admitted 05/03/2021  Discharge 05/08/21  Acute on chronic diastolic congestive heart failure: Presented with symptoms of cough, dyspnea on exertion, hypotension.  CHF team following.  Chest imaging showed bilateral pleural effusion.  Elevated JVP.  Echo done during this hospitalization showed EF of 60 to 65%, no pericardial effusion.Chest CT showed pulmonary congestion, pleural effusion, small pericardial effusion.  Given a dose of 60 mg Lasix IV once,now on hold.  She takes Lasix 40 mg daily at home which we will continue.  She will follow-up with heart failure clinic on 05/16/2021   Acute COPD exacerbation: Possibility of concurrent COPD exacerbation.  Treated with  DuoNeb,prednisone.  Now free of wheezing   Suspected multifocal pneumonia:CT chest showed numerous scattered, ill-defined ground-glass and heterogeneous airspace opacities throughout the lungs, consistent with multifocal pneumonia.  Started on ceftriaxone and azithromycin.  Patient does not have fever, no leukocytosis.  We will change antibiotics to oral on discharge   Acute on chronic hypoxic respiratory failure: Secondary to CHF/COPD: On 4 L of oxygen per minute at home,now at baseline   Hypertension: BP  stable   History of paroxysmal A. fib: .  No anticoagulation due to history of GI bleed.  On amiodarone at home which is being planned to be discontinued due to pulmonary findings after outpatient appointment   History of severe pulmonary hypertension  with signs of cor pulmonale: On Ambrisentan, tadafil.  Follows with cardiology.   CKD stage IIIb: Creatinine baseline is around 1.5.  Kidney function at baseline   Liver cirrhosis: As seen on CT.  Could be secondary to right heart failure.  Hepatitis serologies negative.   Hypothyroidism: Recently stopped taking Synthyroid,saying she cudnt tolerate it.  TSH is elevated at 8.9.  I recommended to be started on levothyroxine 50 mcg daily, but patient and her husband strictly denying to take it   Chronic normocytic anemia: Currently hemoglobin stable.  Has mild thrombocytopenia.Iron panel normal   Debility/deconditioning/weakness: Done with PT/OT evaluation, recommended home health on discharge   Appointment with heart failure clinic on 05/16/2021 at 3 PM    Discharged on cefdinir BID x 2 days... causing diarrhea... almost done.  On lasix 40 mg.. using once a day in last few days.  Husband  started her on probiotic.  She is drinking moderate amount of liquids.   Oxygen  on 4 % [place].. O2 sats 93-97%   BP has been low but she is not dizzy.   BP Readings from Last 3 Encounters:  05/10/21 (!) 83/35  05/08/21 (!) 100/42  12/14/20 116/68     Relevant past medical, surgical, family and social history reviewed and updated as indicated. Interim medical history since our last visit reviewed. Allergies and medications reviewed and updated. Outpatient Medications Prior to Visit  Medication Sig Dispense Refill   acetaminophen (TYLENOL) 325 MG tablet Take 325 mg by mouth at bedtime  as needed for moderate pain or headache.     acetaminophen (TYLENOL) 500 MG tablet Take 500 mg by mouth every 4 (four) hours as needed for headache (pain).     albuterol (VENTOLIN HFA) 108 (90 Base) MCG/ACT inhaler INHALE 2 PUFFS INTO THE LUNGS EVERY 6 HOURS AS NEEDED FOR WHEEZING/SHORTNESS OF BREATH 36 each 0   ambrisentan (LETAIRIS) 10 MG tablet Take 1 tablet (10 mg total) by mouth daily. Please schedule an appointment  for further refills 30 tablet 1   amiodarone (PACERONE) 100 MG tablet TAKE 1 TABLET BY MOUTH EVERY DAY 90 tablet 2   aspirin EC 81 MG EC tablet Take 1 tablet (81 mg total) by mouth daily. 30 tablet 0   cefdinir (OMNICEF) 300 MG capsule Take 1 capsule (300 mg total) by mouth 2 (two) times daily for 2 days. 4 capsule 0   ciclopirox (PENLAC) 8 % solution Apply topically at bedtime. Apply over nail and surrounding skin. Apply daily over previous coat. After seven (7) days, may remove with alcohol and continue cycle. 6.6 mL 0   ferrous sulfate 325 (65 FE) MG EC tablet TAKE 1 TABLET BY MOUTH 2 TIMES DAILY WITH A MEAL. 180 tablet 1   furosemide (LASIX) 40 MG tablet TAKE 1 TABLET BY MOUTH TWICE A DAY 180 tablet 3   guaiFENesin-dextromethorphan (ROBITUSSIN DM) 100-10 MG/5ML syrup Take 10 mLs by mouth every 6 (six) hours as needed for cough. 118 mL 1   ketoconazole (NIZORAL) 2 % cream Apply 1 application topically daily. 15 g 0   OXYGEN Inhale 4 L into the lungs continuous.     pantoprazole (PROTONIX) 40 MG tablet TAKE 1 TABLET BY MOUTH EVERY DAY 90 tablet 1   polyvinyl alcohol (LIQUIFILM TEARS) 1.4 % ophthalmic solution Place 1 drop into both eyes daily as needed for dry eyes.     potassium chloride SA (KLOR-CON M20) 20 MEQ tablet TAKE 1 TABLET BY MOUTH EVERY DAY 90 tablet 0   spironolactone (ALDACTONE) 25 MG tablet TAKE 1/2 TABLET BY MOUTH EVERY DAY 15 tablet 11   tadalafil (CIALIS) 20 MG tablet Take 2 tablets (40 mg total) by mouth daily. 180 tablet 3   No facility-administered medications prior to visit.     Per HPI unless specifically indicated in ROS section below Review of Systems  Constitutional:  Negative for fatigue and fever.  HENT:  Negative for ear pain.   Eyes:  Negative for pain.  Respiratory:  Negative for chest tightness and shortness of breath.   Cardiovascular:  Negative for chest pain, palpitations and leg swelling.  Gastrointestinal:  Negative for abdominal pain.  Genitourinary:   Negative for dysuria.  Objective:  BP (!) 83/35   Pulse 73   Temp 97.7 F (36.5 C) (Temporal)   Ht 5' 3.5" (1.613 m)   Wt 138 lb 4 oz (62.7 kg)   SpO2 100% Comment: 4 L O2  BMI 24.11 kg/m   Wt Readings from Last 3 Encounters:  05/10/21 138 lb 4 oz (62.7 kg)  05/08/21 141 lb 12.1 oz (64.3 kg)  12/14/20 135 lb (61.2 kg)      Physical Exam Constitutional:      General: She is not in acute distress.    Appearance: Normal appearance. She is well-developed. She is not ill-appearing or toxic-appearing.     Comments: On oxygen via [place]  HENT:     Head: Normocephalic.     Right Ear: Hearing, tympanic membrane, ear canal and external ear  normal. Tympanic membrane is not erythematous, retracted or bulging.     Left Ear: Hearing, tympanic membrane, ear canal and external ear normal. Tympanic membrane is not erythematous, retracted or bulging.     Nose: No mucosal edema or rhinorrhea.     Right Sinus: No maxillary sinus tenderness or frontal sinus tenderness.     Left Sinus: No maxillary sinus tenderness or frontal sinus tenderness.     Mouth/Throat:     Pharynx: Uvula midline.  Eyes:     General: Lids are normal. Lids are everted, no foreign bodies appreciated.     Conjunctiva/sclera: Conjunctivae normal.     Pupils: Pupils are equal, round, and reactive to light.  Neck:     Thyroid: No thyroid mass or thyromegaly.     Vascular: No carotid bruit.     Trachea: Trachea normal.  Cardiovascular:     Rate and Rhythm: Normal rate and regular rhythm.     Pulses: Normal pulses.     Heart sounds: Normal heart sounds, S1 normal and S2 normal. No murmur heard.   No friction rub. No gallop.  Pulmonary:     Effort: Pulmonary effort is normal. No tachypnea or respiratory distress.     Breath sounds: Decreased breath sounds present. No wheezing, rhonchi or rales.  Abdominal:     General: Bowel sounds are normal.     Palpations: Abdomen is soft.     Tenderness: There is no abdominal tenderness.   Musculoskeletal:     Cervical back: Normal range of motion and neck supple.  Skin:    General: Skin is warm and dry.     Findings: No rash.  Neurological:     Mental Status: She is alert.  Psychiatric:        Mood and Affect: Mood is not anxious or depressed.        Speech: Speech normal.        Behavior: Behavior normal. Behavior is cooperative.        Thought Content: Thought content normal.        Judgment: Judgment normal.      Results for orders placed or performed during the hospital encounter of 05/03/21  Resp Panel by RT-PCR (Flu A&B, Covid) Nasopharyngeal Swab   Specimen: Nasopharyngeal Swab; Nasopharyngeal(NP) swabs in vial transport medium  Result Value Ref Range   SARS Coronavirus 2 by RT PCR NEGATIVE NEGATIVE   Influenza A by PCR NEGATIVE NEGATIVE   Influenza B by PCR NEGATIVE NEGATIVE  Group A Strep by PCR   Specimen: Nasopharyngeal Swab; Sterile Swab  Result Value Ref Range   Group A Strep by PCR NOT DETECTED NOT DETECTED  Basic metabolic panel  Result Value Ref Range   Sodium 141 135 - 145 mmol/L   Potassium 4.7 3.5 - 5.1 mmol/L   Chloride 102 98 - 111 mmol/L   CO2 35 (H) 22 - 32 mmol/L   Glucose, Bld 94 70 - 99 mg/dL   BUN 24 (H) 8 - 23 mg/dL   Creatinine, Ser 1.51 (H) 0.44 - 1.00 mg/dL   Calcium 8.5 (L) 8.9 - 10.3 mg/dL   GFR, Estimated 36 (L) >60 mL/min   Anion gap 4 (L) 5 - 15  CBC with Differential/Platelet  Result Value Ref Range   WBC 4.7 4.0 - 10.5 K/uL   RBC 3.36 (L) 3.87 - 5.11 MIL/uL   Hemoglobin 10.2 (L) 12.0 - 15.0 g/dL   HCT 34.7 (L) 36.0 - 46.0 %   MCV  103.3 (H) 80.0 - 100.0 fL   MCH 30.4 26.0 - 34.0 pg   MCHC 29.4 (L) 30.0 - 36.0 g/dL   RDW 18.0 (H) 11.5 - 15.5 %   Platelets 136 (L) 150 - 400 K/uL   nRBC 0.0 0.0 - 0.2 %   Neutrophils Relative % 66 %   Neutro Abs 3.2 1.7 - 7.7 K/uL   Lymphocytes Relative 18 %   Lymphs Abs 0.8 0.7 - 4.0 K/uL   Monocytes Relative 12 %   Monocytes Absolute 0.5 0.1 - 1.0 K/uL   Eosinophils Relative  2 %   Eosinophils Absolute 0.1 0.0 - 0.5 K/uL   Basophils Relative 1 %   Basophils Absolute 0.0 0.0 - 0.1 K/uL   WBC Morphology MORPHOLOGY UNREMARKABLE    Smear Review MORPHOLOGY UNREMARKABLE    Immature Granulocytes 1 %   Abs Immature Granulocytes 0.03 0.00 - 0.07 K/uL   Tear Drop Cells PRESENT   Blood gas, venous  Result Value Ref Range   FIO2 44.00    pH, Ven 7.277 7.250 - 7.430   pCO2, Ven 67.9 (H) 44.0 - 60.0 mmHg   pO2, Ven 36.0 32.0 - 45.0 mmHg   Bicarbonate 30.6 (H) 20.0 - 28.0 mmol/L   Acid-Base Excess 4.3 (H) 0.0 - 2.0 mmol/L   O2 Saturation 60.5 %   Patient temperature 37.0    Drawn by 1444    Sample type VENOUS   Hepatic function panel  Result Value Ref Range   Total Protein 6.4 (L) 6.5 - 8.1 g/dL   Albumin 2.6 (L) 3.5 - 5.0 g/dL   AST 18 15 - 41 U/L   ALT 7 0 - 44 U/L   Alkaline Phosphatase 48 38 - 126 U/L   Total Bilirubin 0.5 0.3 - 1.2 mg/dL   Bilirubin, Direct 0.1 0.0 - 0.2 mg/dL   Indirect Bilirubin 0.4 0.3 - 0.9 mg/dL  Brain natriuretic peptide  Result Value Ref Range   B Natriuretic Peptide 358.4 (H) 0.0 - 100.0 pg/mL  TSH  Result Value Ref Range   TSH 8.955 (H) 0.350 - 4.500 uIU/mL  Basic metabolic panel  Result Value Ref Range   Sodium 138 135 - 145 mmol/L   Potassium 5.1 3.5 - 5.1 mmol/L   Chloride 102 98 - 111 mmol/L   CO2 29 22 - 32 mmol/L   Glucose, Bld 139 (H) 70 - 99 mg/dL   BUN 33 (H) 8 - 23 mg/dL   Creatinine, Ser 1.91 (H) 0.44 - 1.00 mg/dL   Calcium 8.0 (L) 8.9 - 10.3 mg/dL   GFR, Estimated 27 (L) >60 mL/min   Anion gap 7 5 - 15  CBC  Result Value Ref Range   WBC 4.2 4.0 - 10.5 K/uL   RBC 2.90 (L) 3.87 - 5.11 MIL/uL   Hemoglobin 9.0 (L) 12.0 - 15.0 g/dL   HCT 29.4 (L) 36.0 - 46.0 %   MCV 101.4 (H) 80.0 - 100.0 fL   MCH 31.0 26.0 - 34.0 pg   MCHC 30.6 30.0 - 36.0 g/dL   RDW 17.7 (H) 11.5 - 15.5 %   Platelets 141 (L) 150 - 400 K/uL   nRBC 0.0 0.0 - 0.2 %  Basic metabolic panel  Result Value Ref Range   Sodium 139 135 - 145  mmol/L   Potassium 4.9 3.5 - 5.1 mmol/L   Chloride 102 98 - 111 mmol/L   CO2 29 22 - 32 mmol/L   Glucose, Bld 117 (H) 70 -  99 mg/dL   BUN 45 (H) 8 - 23 mg/dL   Creatinine, Ser 1.93 (H) 0.44 - 1.00 mg/dL   Calcium 8.4 (L) 8.9 - 10.3 mg/dL   GFR, Estimated 27 (L) >60 mL/min   Anion gap 8 5 - 15  CBC  Result Value Ref Range   WBC 7.5 4.0 - 10.5 K/uL   RBC 2.80 (L) 3.87 - 5.11 MIL/uL   Hemoglobin 8.6 (L) 12.0 - 15.0 g/dL   HCT 28.0 (L) 36.0 - 46.0 %   MCV 100.0 80.0 - 100.0 fL   MCH 30.7 26.0 - 34.0 pg   MCHC 30.7 30.0 - 36.0 g/dL   RDW 17.5 (H) 11.5 - 15.5 %   Platelets 146 (L) 150 - 400 K/uL   nRBC 0.3 (H) 0.0 - 0.2 %  Urinalysis, Routine w reflex microscopic Urine, Clean Catch  Result Value Ref Range   Color, Urine YELLOW YELLOW   APPearance HAZY (A) CLEAR   Specific Gravity, Urine 1.021 1.005 - 1.030   pH 5.0 5.0 - 8.0   Glucose, UA NEGATIVE NEGATIVE mg/dL   Hgb urine dipstick NEGATIVE NEGATIVE   Bilirubin Urine NEGATIVE NEGATIVE   Ketones, ur NEGATIVE NEGATIVE mg/dL   Protein, ur NEGATIVE NEGATIVE mg/dL   Nitrite NEGATIVE NEGATIVE   Leukocytes,Ua TRACE (A) NEGATIVE   RBC / HPF 0-5 0 - 5 RBC/hpf   WBC, UA 6-10 0 - 5 WBC/hpf   Bacteria, UA RARE (A) NONE SEEN   Squamous Epithelial / LPF 0-5 0 - 5   Hyaline Casts, UA PRESENT   Basic metabolic panel  Result Value Ref Range   Sodium 138 135 - 145 mmol/L   Potassium 5.6 (H) 3.5 - 5.1 mmol/L   Chloride 99 98 - 111 mmol/L   CO2 30 22 - 32 mmol/L   Glucose, Bld 112 (H) 70 - 99 mg/dL   BUN 47 (H) 8 - 23 mg/dL   Creatinine, Ser 1.96 (H) 0.44 - 1.00 mg/dL   Calcium 8.3 (L) 8.9 - 10.3 mg/dL   GFR, Estimated 27 (L) >60 mL/min   Anion gap 9 5 - 15  Ferritin  Result Value Ref Range   Ferritin 162 11 - 307 ng/mL  Iron and TIBC  Result Value Ref Range   Iron 55 28 - 170 ug/dL   TIBC 252 250 - 450 ug/dL   Saturation Ratios 22 10.4 - 31.8 %   UIBC 197 ug/dL  Potassium  Result Value Ref Range   Potassium 4.8 3.5 - 5.1 mmol/L   Basic metabolic panel  Result Value Ref Range   Sodium 140 135 - 145 mmol/L   Potassium 4.9 3.5 - 5.1 mmol/L   Chloride 101 98 - 111 mmol/L   CO2 29 22 - 32 mmol/L   Glucose, Bld 100 (H) 70 - 99 mg/dL   BUN 41 (H) 8 - 23 mg/dL   Creatinine, Ser 1.77 (H) 0.44 - 1.00 mg/dL   Calcium 8.4 (L) 8.9 - 10.3 mg/dL   GFR, Estimated 30 (L) >60 mL/min   Anion gap 10 5 - 15  Basic metabolic panel  Result Value Ref Range   Sodium 141 135 - 145 mmol/L   Potassium 4.2 3.5 - 5.1 mmol/L   Chloride 105 98 - 111 mmol/L   CO2 30 22 - 32 mmol/L   Glucose, Bld 91 70 - 99 mg/dL   BUN 31 (H) 8 - 23 mg/dL   Creatinine, Ser 1.48 (H) 0.44 - 1.00 mg/dL  Calcium 8.3 (L) 8.9 - 10.3 mg/dL   GFR, Estimated 37 (L) >60 mL/min   Anion gap 6 5 - 15  I-Stat venous blood gas, ED  Result Value Ref Range   pH, Ven 7.362 7.250 - 7.430   pCO2, Ven 56.1 44.0 - 60.0 mmHg   pO2, Ven 68.0 (H) 32.0 - 45.0 mmHg   Bicarbonate 31.9 (H) 20.0 - 28.0 mmol/L   TCO2 34 (H) 22 - 32 mmol/L   O2 Saturation 92.0 %   Acid-Base Excess 5.0 (H) 0.0 - 2.0 mmol/L   Sodium 141 135 - 145 mmol/L   Potassium 4.7 3.5 - 5.1 mmol/L   Calcium, Ion 1.10 (L) 1.15 - 1.40 mmol/L   HCT 28.0 (L) 36.0 - 46.0 %   Hemoglobin 9.5 (L) 12.0 - 15.0 g/dL   Sample type VENOUS   ECHOCARDIOGRAM COMPLETE  Result Value Ref Range   Weight 2,161.6 oz   Height 63 in   BP 92/41 mmHg   S' Lateral 2.40 cm   Area-P 1/2 2.42 cm2    This visit occurred during the SARS-CoV-2 public health emergency.  Safety protocols were in place, including screening questions prior to the visit, additional usage of staff PPE, and extensive cleaning of exam room while observing appropriate contact time as indicated for disinfecting solutions.   COVID 19 screen:  No recent travel or known exposure to COVID19 The patient denies respiratory symptoms of COVID 19 at this time. The importance of social distancing was discussed today.   Assessment and Plan    Problem List Items  Addressed This Visit     Chronic diastolic CHF (congestive heart failure) (HCC) (Chronic)    Close follow up with CHF clinic as planned.      HTN (hypertension) (Chronic)     Only very gentle diuresis given  Hypotension.. currently asymptomatic.      Acute on chronic respiratory failure with hypoxia (HCC) - Primary    Due to COPD exacerbation  With concurrent pneumonia.  Continue continuous oxygen 4 L [place] Follow O2 sats.      CHF (congestive heart failure) ([place])   Hypothyroid    Feels she has SE to levothyroxine. Willing to start very low dose and titrate up.  Try very lowest dose of levothyroxine... take 1/2 tab of the 50 mcg have at home.. if it cause sedation.. call.  recheck in 4-6 weeks.      Pleural effusion, bilateral   Pneumonia due to infectious organism    Completing course of oral cefdinir. Keep up with po intake given mild diarrhea. Pt reports she can tolerate until she is done.      Pulmonary artery hypertension (HCC)      Eliezer Lofts, MD

## 2021-05-10 NOTE — Telephone Encounter (Signed)
Magda Paganini given verbal orders for weekly nursing visit for heart failure.

## 2021-05-10 NOTE — Patient Instructions (Addendum)
Continue  probipotic.  Try to increase fluids some to keep up with diarrhea. Keep appointment with heart failure clinic on 05/16/2021 at 3 PM  Try very lowest dose of levothyroxine... take 1/2 tab of the 50 mcg have at home.. if it cause sedation.. call.  If you do not have any at home.. call and I will send in 25 mcg  for you to take 1/2 tab or 1 tab daily. Return for lab recheck thyroid in 4-6 weeks.

## 2021-05-10 NOTE — Chronic Care Management (AMB) (Signed)
  Chronic Care Management   Note  05/10/2021 Name: Linda Stevens MRN: [id number] DOB: 1946-03-02  Linda Stevens is a 75 y.o. year old female who is a primary care patient of Bedsole, Amy E, MD. I reached out to Linda Stevens by phone today in response to a referral sent by Linda Stevens's PCP [name], MD     Linda Stevens was given information about Chronic Care Management services today including:  CCM service includes personalized support from designated clinical staff supervised by her physician, including individualized plan of care and coordination with other care providers 24/7 contact phone numbers for assistance for urgent and routine care needs. Service will only be billed when office clinical staff spend 20 minutes or more in a month to coordinate care. Only one practitioner may furnish and bill the service in a calendar month. The patient may stop CCM services at any time (effective at the end of the month) by phone call to the office staff. The patient will be responsible for cost sharing (co-pay) of up to 20% of the service fee (after annual deductible is met).  Patient agreed to services and verbal consent obtained.   Follow up plan: Telephone appointment with care management team member scheduled for: 05/23/2021  Julian Hy, [facility]  Direct Dial: [phone]

## 2021-05-10 NOTE — Telephone Encounter (Signed)
Home Health verbal orders Caller Name: Agency Name: [facility] number: [phone]  Requesting OT/PT/Skilled nursing/Social Work/Speech: nursing, pt, ot  Reason: heart failure   Frequency: nursing is weekly  Please forward to [facility] pool or providers CMA

## 2021-05-14 ENCOUNTER — Inpatient Hospital Stay: Payer: Medicare HMO | Admitting: Family Medicine

## 2021-05-14 DIAGNOSIS — J439 Emphysema, unspecified: Secondary | ICD-10-CM | POA: Diagnosis not present

## 2021-05-14 DIAGNOSIS — J90 Pleural effusion, not elsewhere classified: Secondary | ICD-10-CM | POA: Diagnosis not present

## 2021-05-14 DIAGNOSIS — Z9981 Dependence on supplemental oxygen: Secondary | ICD-10-CM | POA: Diagnosis not present

## 2021-05-14 DIAGNOSIS — M1990 Unspecified osteoarthritis, unspecified site: Secondary | ICD-10-CM | POA: Diagnosis not present

## 2021-05-14 DIAGNOSIS — N183 Chronic kidney disease, stage 3 unspecified: Secondary | ICD-10-CM | POA: Diagnosis not present

## 2021-05-14 DIAGNOSIS — Z87891 Personal history of nicotine dependence: Secondary | ICD-10-CM | POA: Diagnosis not present

## 2021-05-14 DIAGNOSIS — I272 Pulmonary hypertension, unspecified: Secondary | ICD-10-CM | POA: Diagnosis not present

## 2021-05-14 DIAGNOSIS — K7460 Unspecified cirrhosis of liver: Secondary | ICD-10-CM | POA: Diagnosis not present

## 2021-05-14 DIAGNOSIS — I480 Paroxysmal atrial fibrillation: Secondary | ICD-10-CM | POA: Diagnosis not present

## 2021-05-14 DIAGNOSIS — I5033 Acute on chronic diastolic (congestive) heart failure: Secondary | ICD-10-CM | POA: Diagnosis not present

## 2021-05-14 DIAGNOSIS — D631 Anemia in chronic kidney disease: Secondary | ICD-10-CM | POA: Diagnosis not present

## 2021-05-14 DIAGNOSIS — I130 Hypertensive heart and chronic kidney disease with heart failure and stage 1 through stage 4 chronic kidney disease, or unspecified chronic kidney disease: Secondary | ICD-10-CM | POA: Diagnosis not present

## 2021-05-14 DIAGNOSIS — J9611 Chronic respiratory failure with hypoxia: Secondary | ICD-10-CM | POA: Diagnosis not present

## 2021-05-14 DIAGNOSIS — E039 Hypothyroidism, unspecified: Secondary | ICD-10-CM | POA: Diagnosis not present

## 2021-05-14 DIAGNOSIS — J9612 Chronic respiratory failure with hypercapnia: Secondary | ICD-10-CM | POA: Diagnosis not present

## 2021-05-15 ENCOUNTER — Telehealth: Payer: Self-pay | Admitting: Family Medicine

## 2021-05-15 DIAGNOSIS — I480 Paroxysmal atrial fibrillation: Secondary | ICD-10-CM | POA: Diagnosis not present

## 2021-05-15 DIAGNOSIS — D631 Anemia in chronic kidney disease: Secondary | ICD-10-CM | POA: Diagnosis not present

## 2021-05-15 DIAGNOSIS — I272 Pulmonary hypertension, unspecified: Secondary | ICD-10-CM | POA: Diagnosis not present

## 2021-05-15 DIAGNOSIS — I5033 Acute on chronic diastolic (congestive) heart failure: Secondary | ICD-10-CM | POA: Diagnosis not present

## 2021-05-15 DIAGNOSIS — J9611 Chronic respiratory failure with hypoxia: Secondary | ICD-10-CM | POA: Diagnosis not present

## 2021-05-15 DIAGNOSIS — J439 Emphysema, unspecified: Secondary | ICD-10-CM | POA: Diagnosis not present

## 2021-05-15 DIAGNOSIS — Z9981 Dependence on supplemental oxygen: Secondary | ICD-10-CM | POA: Diagnosis not present

## 2021-05-15 DIAGNOSIS — I130 Hypertensive heart and chronic kidney disease with heart failure and stage 1 through stage 4 chronic kidney disease, or unspecified chronic kidney disease: Secondary | ICD-10-CM | POA: Diagnosis not present

## 2021-05-15 DIAGNOSIS — E039 Hypothyroidism, unspecified: Secondary | ICD-10-CM | POA: Diagnosis not present

## 2021-05-15 DIAGNOSIS — Z87891 Personal history of nicotine dependence: Secondary | ICD-10-CM | POA: Diagnosis not present

## 2021-05-15 DIAGNOSIS — M1990 Unspecified osteoarthritis, unspecified site: Secondary | ICD-10-CM | POA: Diagnosis not present

## 2021-05-15 DIAGNOSIS — N183 Chronic kidney disease, stage 3 unspecified: Secondary | ICD-10-CM | POA: Diagnosis not present

## 2021-05-15 DIAGNOSIS — K7460 Unspecified cirrhosis of liver: Secondary | ICD-10-CM | POA: Diagnosis not present

## 2021-05-15 DIAGNOSIS — J9612 Chronic respiratory failure with hypercapnia: Secondary | ICD-10-CM | POA: Diagnosis not present

## 2021-05-15 DIAGNOSIS — J90 Pleural effusion, not elsewhere classified: Secondary | ICD-10-CM | POA: Diagnosis not present

## 2021-05-15 NOTE — Telephone Encounter (Signed)
Mallory with [facility] called needing Verbal order she didn't go into detail per the answering service  Please call Mallory at [phone]

## 2021-05-15 NOTE — Progress Notes (Addendum)
ADVANCED HF CLINIC NOTE   PCP: Dr. Eliezer Lofts Pulmonary: Dr. Melvyn Novas HF Cardiology: Dr Haroldine Laws   HPI: Linda Stevens is  75 y.o.woman with a h/o HTN, COPD (former smoker quit 2015), seasonal allergies, asthma, pulmonary HTN, and diastolic dysfxn.  Admitted 1/17 with progressive DOE, hypoxia, and lower ext edema and found to have severe PAH.  R/LHC 10/08/15 with normal coronaries, normal CO, and severe PAH PAP 104/49 with 13.6 WU.  PAH well out of proportion to left-sided filling pressures and COPD. Started revatio 20 TID with consideration for macitentan as outpatient. PFTs 10/05/15  FEV1 0.91 (40%) FVC 1.64 (55%), DLCO 8.80 (38%). Auto-immune and infectious serologies negative. VQ negative. Discharge weight 148 pounds.   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 Has been drained twice. Underwent first thoracentesis on 11/7.  Transudative. CT scan 07/24/17 showed large R effusion and small pericardial effusion. +COPD.  Underwent repeat thoracentesis on 07/29/17 with 310cc out.  Cytology negative. F/u CXR 11/21 showed small bilateral effusions.  RF markedly ++ but other serology negative. Saw Dr. Amil Amen who said she did not have RA but was diagnosed with polyarthralgia/OA.   Admitted 6/21 with respiratory failure. Echo EF 55-60% with mild RV HK with large peridardial effusion. Suspicion for auto-immune flare. ESR 63. Auto-immune panel again negative. Treated with steroids with prompt improvement and regression of pericardial effusion. Had PAF Went to SNF.  Seen in clinic 7/21. Feeling much better. Able to do ADLs without problem. No edema, orthopnea or PND. Weight stable. Complaint with meds. No palpitations. No bleeding with apixaban.   Readmitted 9/21 with Hgb  3.1. Underwent EGD and colonoscopy , concern for esophageal candidiasis, multiple polyps removed from the colon and colonic AVMs noted which is suspected to be the cause of ongoing  bleeding in the setting of Eliquis. Eliquis stopped. Transfused a total of 4 units of PRBC this admission and given IV iron.  Admitted 8/22 with a/c respiratory failure with hypoxia suspected to be due to combination of ? COPD exacerbation and a/c CHF.  She was started on IV lasix, DuoNebs and prednisone. AHF team consulted to assist with management.  Repeat echo showed EF 60-65%, RV ok. Hospitalization complicated by multifocal PNA and AKI w/ hyperkalemia. Arlyce Harman and diuretics were initially held but later resumed. She was discharged on 4L oxygen (baseline) with HH PT/OT, weight 141 lbs.  Today she returns for post hospitalization HF follow up with her husband and daughter. She has been doing PT/OT 2x week, tired afterwards. Still feels weak, getting around mostly in her WC, able to stand for transfers to bedside commode. Denies CP, dizziness, edema, or PND/Orthopnea. Family says her appetite is poor. No fever or chills. She has not been consistently weight at home. Husband helps her with her medications.  Studies:  Echo 05/04/21: EF 60-65%, RV okay Echo 4/21: LVEF 70-75% RV normal . No effusion. Personally reviewed Echo 4/19: EF 65% RV normal. No effusion Personally reviewed Echo 5/18:EF 60-65% RV completely normal. No TR to measure RVSP. No effusion  Echo 3/17: EF 60-65% septum still flattened. RV size improved moderate HK. RVSP ~80. IVC small. Pericardial effusion essentially resolved   CXR 09/28/17: Small bilateral effusions  PAH regimen: 1. Adcirca 40 2. Letairis 10 (Switched to [name] as it was thought that sinusitis might be due to macitentan.)  6MW 8/17: 840 ft (256 M) on 2 L O2, sats ranged 83-95%, HR ranged 96-111. 6MW 9/19:  780 ft =237.785mters On 3L of O2 Starting O2 94% Ending O2-85%  Starting heart rate-80 Ending heart rate- 110  RHC 12/18: RA = 5 RV = 56/9 PA = 52/17 (33) PCW = 10 Fick cardiac output/index = 7.5/4.5 PVR = 3.5 WU Ao sat = 96% PA sat = 73%, 72% High SVC  sat = 76%  VQ scan low prob. CT chest mild COPD. Moderate pericardial effusion. 3v CAD. + cirrhosis. Hepatitis serologies negative.   PFTs  (FEV1 1.23 (52%) with FVC 2.01 (65%) and DLCO 51% in 7/15) PFT's  10/08/2015  FEV1 0.91 (40 %) ratio 56% DLCO 38 % corrects to 63 % for alv volume  PFTs  12/06/15 FEV1 1.0 (44%) FVC 1.74 (58%0 DLCO 28%  R/LHC 10/08/15 with normal coronaries, normal CO, and sever PAH with 13.6 WU.  Findings: Ao = 108/68 (86) LV = 102/10/11 RA = 9 RV = 97/8/12 PA = 104/49 (72) PCW = 18 Fick cardiac output/index = 3.96/2.34 PVR = 13.6 WU FA sat = 93% PA sat = 66%, 69%  RHC 5/18 RA = 2 RV = 48/4 PA = 45/17 (30) PCW = 8 Fick cardiac output/index = 4.9/3.0 PVR = 4.4 WU Ao sat = 94% PA sat = 68%, 70%  Review of systems complete and found to be negative unless listed in HPI.   SH:  Social History   Socioeconomic History   Marital status: Married    Spouse name: Not on file   Number of children: Not on file   Years of education: 2   Highest education level: Not on file  Occupational History   Occupation: retired tPharmacist, hospital Tobacco Use   Smoking status: Former    Packs/day: 1.00    Years: 40.00    Pack years: 40.00    Types: Cigarettes    Quit date: 02/22/2014    Years since quitting: 7.2   Smokeless tobacco: Never  Vaping Use   Vaping Use: Never used  Substance and Sexual Activity   Alcohol use: No   Drug use: No   Sexual activity: Never  Other Topics Concern   Not on file  Social History Narrative   Married with 2 children.  Independent of ADLs.      Does not have a living will.   Would desire CPR but would not want prolonged life support if futile- husband aware.   Social Determinants of Health   Financial Resource Strain: Low Risk    Difficulty of Paying Living Expenses: Not very hard  Food Insecurity: No Food Insecurity   Worried About RCharity fundraiserin the Last Year: Never true   Ran Out of Food in the Last Year: Never true   Transportation Needs: No Transportation Needs   Lack of Transportation (Medical): No   Lack of Transportation (Non-Medical): No  Physical Activity: Not on file  Stress: Not on file  Social Connections: Not on file  Intimate Partner Violence: Not on file   FH:  Family History  Problem Relation Age of Onset   Glaucoma Brother    Vascular Disease Father        Died of complications from surgery   Heart attack Father    Asthma Mother        Died of asthma attack   Dementia Mother    Hyperlipidemia Mother    Hypertension Mother    Past Medical History:  Diagnosis Date   Acute respiratory failure with hypoxia and hypercapnea 02/26/2014   Allergic  rhinitis due to pollen    Allergy    Anemia 06/12/2020   Arthritis    Asthma    CHF (congestive heart failure) (HCC)    COPD (chronic obstructive pulmonary disease) (HCC)    Diastolic dysfunction    a. 02/2014 Echo: EF 65-70%, Gr 1 DD, RVH;  b. 09/2015 Echo: EF 55%, Gr 1 DD.   Emphysema of lung (HCC)    H/O seasonal allergies    History of chicken pox    History of tobacco abuse    a. Quit 2015.   History of UTI    Hypertension    Hypertensive heart disease    Lower extremity edema    Pericardial effusion    a. 09/2015 Echo: Mod eff w/o tamponade.   Right heart failure with reduced right ventricular function ([place])    a. 09/2015 Echo: EF 55%, Gr 1 DD, D shaped IV septum, sev dil RV with mod reduced fxn, mod dil RA, RV-RA grad 47mHg, PASP 829mg, mod pericard eff w/o tamponade.   Severe Pulmonary Hypertension    a. 09/2015 Echo: PASP 871m.   Current Outpatient Medications  Medication Sig Dispense Refill   acetaminophen (TYLENOL) 325 MG tablet Take 325 mg by mouth at bedtime as needed for moderate pain or headache.     acetaminophen (TYLENOL) 500 MG tablet Take 500 mg by mouth every 4 (four) hours as needed for headache (pain).     albuterol (VENTOLIN HFA) 108 (90 Base) MCG/ACT inhaler INHALE 2 PUFFS INTO THE LUNGS EVERY 6 HOURS AS  NEEDED FOR WHEEZING/SHORTNESS OF BREATH 36 each 0   ambrisentan (LETAIRIS) 10 MG tablet Take 1 tablet (10 mg total) by mouth daily. Please schedule an appointment for further refills 30 tablet 1   amiodarone (PACERONE) 100 MG tablet TAKE 1 TABLET BY MOUTH EVERY DAY 90 tablet 2   aspirin EC 81 MG EC tablet Take 1 tablet (81 mg total) by mouth daily. 30 tablet 0   ciclopirox (PENLAC) 8 % solution Apply topically at bedtime. Apply over nail and surrounding skin. Apply daily over previous coat. After seven (7) days, may remove with alcohol and continue cycle. 6.6 mL 0   ferrous sulfate 325 (65 FE) MG EC tablet TAKE 1 TABLET BY MOUTH 2 TIMES DAILY WITH A MEAL. 180 tablet 1   furosemide (LASIX) 40 MG tablet TAKE 1 TABLET BY MOUTH TWICE A DAY 180 tablet 3   guaiFENesin-dextromethorphan (ROBITUSSIN DM) 100-10 MG/5ML syrup Take 10 mLs by mouth every 6 (six) hours as needed for cough. 118 mL 1   ketoconazole (NIZORAL) 2 % cream Apply 1 application topically daily. 15 g 0   OXYGEN Inhale 4 L into the lungs continuous.     pantoprazole (PROTONIX) 40 MG tablet TAKE 1 TABLET BY MOUTH EVERY DAY 90 tablet 1   polyvinyl alcohol (LIQUIFILM TEARS) 1.4 % ophthalmic solution Place 1 drop into both eyes daily as needed for dry eyes.     potassium chloride SA (KLOR-CON M20) 20 MEQ tablet TAKE 1 TABLET BY MOUTH EVERY DAY 90 tablet 0   spironolactone (ALDACTONE) 25 MG tablet TAKE 1/2 TABLET BY MOUTH EVERY DAY 15 tablet 11   tadalafil (CIALIS) 20 MG tablet Take 2 tablets (40 mg total) by mouth daily. 180 tablet 3   No current facility-administered medications for this encounter.   BP (!) 102/56   Pulse 77   SpO2 93%   Wt Readings from Last 3 Encounters:  05/10/21 62.7 kg  05/08/21  64.3 kg  12/14/20 61.2 kg   PHYSICAL EXAM: General:  NAD. Frail-appearing, arrived in [facility] on oxygen HEENT: Normal Neck: Supple. No JVD. Carotids 2+ bilat; no bruits. No lymphadenopathy or thryomegaly appreciated. Cor: PMI nondisplaced.  Regular rate & rhythm. No rubs, gallops or murmurs. Lungs: Coarse BS in bases. Abdomen: Soft, nontender, nondistended. No hepatosplenomegaly. No bruits or masses. Good bowel sounds. Extremities: No cyanosis, clubbing, rash, trace LE edema, L>R, dry, flaky skin. Neuro: Alert & oriented x 3, cranial nerves grossly intact. Moves all 4 extremities w/o difficulty. Affect pleasant.  ECG: SR 80 bpm (personally reviewed).  ReDs: 31%  ASSESSMENT & PLAN:  1. Chronic diastolic HF with RV failure: - Repeat RHC 12/18 with mild PAH (mean PA 33) - Echo 5/18: with complete resolution of RV strain. No significant TR to estimate RVSP - Echo 4/19: with EF 65% no evidence of RV strain  - Echo 4/21: EF 70-75% RV normal  - Echo 11/21: EF 75% RV normal no evidence PAH - Echo 8/27: EF 60-65%, RV okay, inadequate TR signal to assess PA pressure - Resolution of PAH on recent echos. - Auto-immune panel previously negative. Suspicion for CTD. - NYHA III, functional status limited by deconditioning. Volume ok today, ReDs 31%. - Continue lasix 40 mg bid. She can take extra tablet prn, discussed notifying us if she starts requiring more. - Continue spiro 12.5 mg daily. - Labs today.    2. PAH - Suspect combination of WHO Group I and III PAH - On Letairis and Tadalafil.  - Previously discussed adding Selexipag but pressures down on last cath/echo  3.  COPD-Gold III - On 4L O2 at home - On inhalers. - Follows with Pulmonary.  4. Chronic Respiratory failure - Multifactorial. Continue supplemental O2.  - Follows with Dr. Melvyn Novas.    5.  PAF: - Eliquis stopped due to severe LGIB 9/21 (hgb 3.1). Will not restart with AVMs. - SR on ECG today. - On amio 100 mg. We can stop this.   6. Recurrent R pleural effusion - Suspect possible component of hepatic hydrothorax. Now controlled with with diuresis - Small bilateral effusions noted on CXR/CT this past admit. - Not changed. - We discussed using her IS during  waking hours.   7.  Pericardial effusion: - Small effusion on CT (improved).   8. Cirrhosis - Due to RHF. Hepatitis serologies negative.  - Given previous EF > 75%, may need to consider propanolol +/- midodrine to avoid high-output HF at some point.  9. Deconditioning - Continue PT/OT. - Discussed high-protein food choices/Ensure or Boost to help with her nutrition.   Follow up with Dr. Haroldine Laws in 2-3 months.   [place], FNP-BC. 05/16/21

## 2021-05-15 NOTE — Telephone Encounter (Signed)
Left message for Linda Stevens to call office back with more specific details of what she needs verbal orders for.

## 2021-05-16 ENCOUNTER — Telehealth: Payer: Self-pay | Admitting: Family Medicine

## 2021-05-16 ENCOUNTER — Encounter (HOSPITAL_COMMUNITY): Payer: Self-pay

## 2021-05-16 ENCOUNTER — Ambulatory Visit (HOSPITAL_COMMUNITY)
Admission: RE | Admit: 2021-05-16 | Discharge: 2021-05-16 | Disposition: A | Discharge status: Home / Self Care | Payer: Medicare HMO | Source: Ambulatory Visit | Attending: Family Medicine | Admitting: Family Medicine

## 2021-05-16 ENCOUNTER — Other Ambulatory Visit: Payer: Self-pay

## 2021-05-16 VITALS — BP 102/56 | HR 77

## 2021-05-16 DIAGNOSIS — J9611 Chronic respiratory failure with hypoxia: Secondary | ICD-10-CM | POA: Diagnosis not present

## 2021-05-16 DIAGNOSIS — Z8249 Family history of ischemic heart disease and other diseases of the circulatory system: Secondary | ICD-10-CM | POA: Diagnosis not present

## 2021-05-16 DIAGNOSIS — I480 Paroxysmal atrial fibrillation: Secondary | ICD-10-CM

## 2021-05-16 DIAGNOSIS — J9610 Chronic respiratory failure, unspecified whether with hypoxia or hypercapnia: Secondary | ICD-10-CM | POA: Diagnosis not present

## 2021-05-16 DIAGNOSIS — Z7982 Long term (current) use of aspirin: Secondary | ICD-10-CM | POA: Insufficient documentation

## 2021-05-16 DIAGNOSIS — J449 Chronic obstructive pulmonary disease, unspecified: Secondary | ICD-10-CM | POA: Diagnosis not present

## 2021-05-16 DIAGNOSIS — Z9981 Dependence on supplemental oxygen: Secondary | ICD-10-CM | POA: Diagnosis not present

## 2021-05-16 DIAGNOSIS — Z79899 Other long term (current) drug therapy: Secondary | ICD-10-CM | POA: Diagnosis not present

## 2021-05-16 DIAGNOSIS — I313 Pericardial effusion (noninflammatory): Secondary | ICD-10-CM

## 2021-05-16 DIAGNOSIS — I272 Pulmonary hypertension, unspecified: Secondary | ICD-10-CM | POA: Insufficient documentation

## 2021-05-16 DIAGNOSIS — R5381 Other malaise: Secondary | ICD-10-CM | POA: Diagnosis not present

## 2021-05-16 DIAGNOSIS — I3139 Other pericardial effusion (noninflammatory): Secondary | ICD-10-CM

## 2021-05-16 DIAGNOSIS — I110 Hypertensive heart disease with heart failure: Secondary | ICD-10-CM | POA: Diagnosis not present

## 2021-05-16 DIAGNOSIS — K7460 Unspecified cirrhosis of liver: Secondary | ICD-10-CM | POA: Diagnosis not present

## 2021-05-16 DIAGNOSIS — J90 Pleural effusion, not elsewhere classified: Secondary | ICD-10-CM | POA: Diagnosis not present

## 2021-05-16 DIAGNOSIS — I509 Heart failure, unspecified: Secondary | ICD-10-CM | POA: Diagnosis not present

## 2021-05-16 DIAGNOSIS — Z87891 Personal history of nicotine dependence: Secondary | ICD-10-CM | POA: Insufficient documentation

## 2021-05-16 DIAGNOSIS — I2721 Secondary pulmonary arterial hypertension: Secondary | ICD-10-CM | POA: Diagnosis not present

## 2021-05-16 DIAGNOSIS — I5032 Chronic diastolic (congestive) heart failure: Secondary | ICD-10-CM | POA: Diagnosis not present

## 2021-05-16 LAB — BASIC METABOLIC PANEL
Anion gap: 6 (ref 5–15)
BUN: 21 mg/dL (ref 8–23)
CO2: 37 mmol/L — ABNORMAL HIGH (ref 22–32)
Calcium: 8.2 mg/dL — ABNORMAL LOW (ref 8.9–10.3)
Chloride: 98 mmol/L (ref 98–111)
Creatinine, Ser: 1.61 mg/dL — ABNORMAL HIGH (ref 0.44–1.00)
GFR, Estimated: 34 mL/min — ABNORMAL LOW (ref >60)
Glucose, Bld: 124 mg/dL — ABNORMAL HIGH (ref 70–99)
Potassium: 4.6 mmol/L (ref 3.5–5.1)
Sodium: 141 mmol/L (ref 135–145)

## 2021-05-16 LAB — CBC
HCT: 32.4 % — ABNORMAL LOW (ref 36.0–46.0)
Hemoglobin: 9.8 g/dL — ABNORMAL LOW (ref 12.0–15.0)
MCH: 31.3 pg (ref 26.0–34.0)
MCHC: 30.2 g/dL (ref 30.0–36.0)
MCV: 103.5 fL — ABNORMAL HIGH (ref 80.0–100.0)
Platelets: 146 10*3/uL — ABNORMAL LOW (ref 150–400)
RBC: 3.13 MIL/uL — ABNORMAL LOW (ref 3.87–5.11)
RDW: 20.3 % — ABNORMAL HIGH (ref 11.5–15.5)
WBC: 5.7 10*3/uL (ref 4.0–10.5)
nRBC: 0 % (ref 0.0–0.2)

## 2021-05-16 NOTE — Telephone Encounter (Signed)
Verbal orders given to Mallory for OT 1 x week for 8 weeks.

## 2021-05-16 NOTE — Telephone Encounter (Signed)
Home Health verbal orders Caller Name: [place] Name: Moreen Fowler number: [phone]  Requesting OT/PT/Skilled nursing/Social Work/Speech: OT  Reason:  Frequency: 1w8  Please forward to [facility] pool or providers CMA

## 2021-05-16 NOTE — Patient Instructions (Signed)
STOP Amiodarone  Be sure to use your incentive spirometer  10x's per hour when watching TV.  Be sure to increase the protein in your diet  Your physician recommends that you schedule a follow-up appointment in: 2-3 months with Dr Haroldine Laws  Do the following things EVERYDAY: Weigh yourself in the morning before breakfast. Write it down and keep it in a log. Take your medicines as prescribed Eat low salt foods--Limit salt (sodium) to 2000 mg per day.  Stay as active as you can everyday Limit all fluids for the day to less than 2 liters  At the [facility], you and your health needs are our priority. As part of our continuing mission to provide you with exceptional heart care, we have created designated Provider Care Teams. These Care Teams include your primary Cardiologist (physician) and Advanced Practice Providers (APPs- Physician Assistants and Nurse Practitioners) who all work together to provide you with the care you need, when you need it.   You may see any of the following providers on your designated Care Team at your next follow up: Dr [name] Dr Loralie Champagne Dr Patrice Paradise, NP Lyda Jester, [place] Ginnie Smart Audry Riles, PharmD   Please be sure to bring in all your medications bottles to every appointment.   If you have any questions or concerns before your next appointment please send [place] a message through [place] or call our office at [phone].    TO LEAVE A MESSAGE FOR THE NURSE SELECT OPTION 2, PLEASE LEAVE A MESSAGE INCLUDING: YOUR NAME DATE OF BIRTH CALL BACK NUMBER REASON FOR CALL**this is important as we prioritize the call backs  YOU WILL RECEIVE A CALL BACK THE SAME DAY AS LONG AS YOU CALL BEFORE 4:00 PM

## 2021-05-16 NOTE — Progress Notes (Signed)
ReDS Vest / Clip - 05/16/21 1500       ReDS Vest / Clip   Station Marker A    Ruler Value 24    ReDS Value Range Low volume    ReDS Actual Value 31

## 2021-05-16 NOTE — Telephone Encounter (Signed)
Tried to reach [facility] again in regards to orders she is needing.  Her Voicemail in full so I was unable to leave message.  Will close encounter at this point and await call back.

## 2021-05-17 DIAGNOSIS — Z87891 Personal history of nicotine dependence: Secondary | ICD-10-CM | POA: Diagnosis not present

## 2021-05-17 DIAGNOSIS — I480 Paroxysmal atrial fibrillation: Secondary | ICD-10-CM | POA: Diagnosis not present

## 2021-05-17 DIAGNOSIS — I272 Pulmonary hypertension, unspecified: Secondary | ICD-10-CM | POA: Diagnosis not present

## 2021-05-17 DIAGNOSIS — E039 Hypothyroidism, unspecified: Secondary | ICD-10-CM | POA: Diagnosis not present

## 2021-05-17 DIAGNOSIS — D631 Anemia in chronic kidney disease: Secondary | ICD-10-CM | POA: Diagnosis not present

## 2021-05-17 DIAGNOSIS — I5033 Acute on chronic diastolic (congestive) heart failure: Secondary | ICD-10-CM | POA: Diagnosis not present

## 2021-05-17 DIAGNOSIS — I130 Hypertensive heart and chronic kidney disease with heart failure and stage 1 through stage 4 chronic kidney disease, or unspecified chronic kidney disease: Secondary | ICD-10-CM | POA: Diagnosis not present

## 2021-05-17 DIAGNOSIS — J9611 Chronic respiratory failure with hypoxia: Secondary | ICD-10-CM | POA: Diagnosis not present

## 2021-05-17 DIAGNOSIS — N183 Chronic kidney disease, stage 3 unspecified: Secondary | ICD-10-CM | POA: Diagnosis not present

## 2021-05-17 DIAGNOSIS — K7460 Unspecified cirrhosis of liver: Secondary | ICD-10-CM | POA: Diagnosis not present

## 2021-05-17 DIAGNOSIS — J439 Emphysema, unspecified: Secondary | ICD-10-CM | POA: Diagnosis not present

## 2021-05-17 DIAGNOSIS — J90 Pleural effusion, not elsewhere classified: Secondary | ICD-10-CM | POA: Diagnosis not present

## 2021-05-17 DIAGNOSIS — M1990 Unspecified osteoarthritis, unspecified site: Secondary | ICD-10-CM | POA: Diagnosis not present

## 2021-05-17 DIAGNOSIS — Z9981 Dependence on supplemental oxygen: Secondary | ICD-10-CM | POA: Diagnosis not present

## 2021-05-17 DIAGNOSIS — J9612 Chronic respiratory failure with hypercapnia: Secondary | ICD-10-CM | POA: Diagnosis not present

## 2021-05-20 DIAGNOSIS — E039 Hypothyroidism, unspecified: Secondary | ICD-10-CM | POA: Diagnosis not present

## 2021-05-20 DIAGNOSIS — I5033 Acute on chronic diastolic (congestive) heart failure: Secondary | ICD-10-CM | POA: Diagnosis not present

## 2021-05-20 DIAGNOSIS — K7460 Unspecified cirrhosis of liver: Secondary | ICD-10-CM | POA: Diagnosis not present

## 2021-05-20 DIAGNOSIS — N183 Chronic kidney disease, stage 3 unspecified: Secondary | ICD-10-CM | POA: Diagnosis not present

## 2021-05-20 DIAGNOSIS — D631 Anemia in chronic kidney disease: Secondary | ICD-10-CM | POA: Diagnosis not present

## 2021-05-20 DIAGNOSIS — M1990 Unspecified osteoarthritis, unspecified site: Secondary | ICD-10-CM | POA: Diagnosis not present

## 2021-05-20 DIAGNOSIS — I130 Hypertensive heart and chronic kidney disease with heart failure and stage 1 through stage 4 chronic kidney disease, or unspecified chronic kidney disease: Secondary | ICD-10-CM | POA: Diagnosis not present

## 2021-05-20 DIAGNOSIS — J90 Pleural effusion, not elsewhere classified: Secondary | ICD-10-CM | POA: Diagnosis not present

## 2021-05-20 DIAGNOSIS — Z87891 Personal history of nicotine dependence: Secondary | ICD-10-CM | POA: Diagnosis not present

## 2021-05-20 DIAGNOSIS — I272 Pulmonary hypertension, unspecified: Secondary | ICD-10-CM | POA: Diagnosis not present

## 2021-05-20 DIAGNOSIS — I480 Paroxysmal atrial fibrillation: Secondary | ICD-10-CM | POA: Diagnosis not present

## 2021-05-20 DIAGNOSIS — Z9981 Dependence on supplemental oxygen: Secondary | ICD-10-CM | POA: Diagnosis not present

## 2021-05-20 DIAGNOSIS — J9611 Chronic respiratory failure with hypoxia: Secondary | ICD-10-CM | POA: Diagnosis not present

## 2021-05-20 DIAGNOSIS — J439 Emphysema, unspecified: Secondary | ICD-10-CM | POA: Diagnosis not present

## 2021-05-20 DIAGNOSIS — J9612 Chronic respiratory failure with hypercapnia: Secondary | ICD-10-CM | POA: Diagnosis not present

## 2021-05-23 ENCOUNTER — Ambulatory Visit: Payer: Medicare HMO

## 2021-05-23 DIAGNOSIS — J449 Chronic obstructive pulmonary disease, unspecified: Secondary | ICD-10-CM

## 2021-05-23 DIAGNOSIS — I509 Heart failure, unspecified: Secondary | ICD-10-CM

## 2021-05-23 NOTE — Chronic Care Management (AMB) (Signed)
  Care Management   Outreach Note  05/23/2021 Name: Linda Stevens MRN: [id number] DOB: 1946-05-19  Referred by: [name], MD Reason for referral : Chronic Care Management (Initial assessment)   Successful contact was made with patient's spouse/ designated party release, [name] Palen to discus case management and care coordination services.  HIPAA verified by spouse for patient.  Spouse states patient currently has nurse, occupational therapy and physical therapy coming in the home for services. He states, " there is a lot going on right now."   Requested return call from [facility] in a few weeks.   Follow Up Plan:  The patient has been provided with contact information for the care management team and has been advised to call with any health related questions or concerns.  The care management team will reach out to the patient again over the next 3 weeks.     Quinn Plowman RN,BSN,CCM RN Case Manager [place]  [phone]

## 2021-05-24 DIAGNOSIS — J9611 Chronic respiratory failure with hypoxia: Secondary | ICD-10-CM | POA: Diagnosis not present

## 2021-05-24 DIAGNOSIS — N183 Chronic kidney disease, stage 3 unspecified: Secondary | ICD-10-CM | POA: Diagnosis not present

## 2021-05-24 DIAGNOSIS — D631 Anemia in chronic kidney disease: Secondary | ICD-10-CM | POA: Diagnosis not present

## 2021-05-24 DIAGNOSIS — K7460 Unspecified cirrhosis of liver: Secondary | ICD-10-CM | POA: Diagnosis not present

## 2021-05-24 DIAGNOSIS — E039 Hypothyroidism, unspecified: Secondary | ICD-10-CM | POA: Diagnosis not present

## 2021-05-24 DIAGNOSIS — M1990 Unspecified osteoarthritis, unspecified site: Secondary | ICD-10-CM | POA: Diagnosis not present

## 2021-05-24 DIAGNOSIS — J9612 Chronic respiratory failure with hypercapnia: Secondary | ICD-10-CM | POA: Diagnosis not present

## 2021-05-24 DIAGNOSIS — Z87891 Personal history of nicotine dependence: Secondary | ICD-10-CM | POA: Diagnosis not present

## 2021-05-24 DIAGNOSIS — J439 Emphysema, unspecified: Secondary | ICD-10-CM | POA: Diagnosis not present

## 2021-05-24 DIAGNOSIS — I130 Hypertensive heart and chronic kidney disease with heart failure and stage 1 through stage 4 chronic kidney disease, or unspecified chronic kidney disease: Secondary | ICD-10-CM | POA: Diagnosis not present

## 2021-05-24 DIAGNOSIS — I5033 Acute on chronic diastolic (congestive) heart failure: Secondary | ICD-10-CM | POA: Diagnosis not present

## 2021-05-24 DIAGNOSIS — Z9981 Dependence on supplemental oxygen: Secondary | ICD-10-CM | POA: Diagnosis not present

## 2021-05-24 DIAGNOSIS — I272 Pulmonary hypertension, unspecified: Secondary | ICD-10-CM | POA: Diagnosis not present

## 2021-05-24 DIAGNOSIS — I480 Paroxysmal atrial fibrillation: Secondary | ICD-10-CM | POA: Diagnosis not present

## 2021-05-24 DIAGNOSIS — J90 Pleural effusion, not elsewhere classified: Secondary | ICD-10-CM | POA: Diagnosis not present

## 2021-05-28 ENCOUNTER — Other Ambulatory Visit: Payer: Self-pay | Admitting: Family Medicine

## 2021-05-29 DIAGNOSIS — E039 Hypothyroidism, unspecified: Secondary | ICD-10-CM | POA: Diagnosis not present

## 2021-05-29 DIAGNOSIS — I272 Pulmonary hypertension, unspecified: Secondary | ICD-10-CM | POA: Diagnosis not present

## 2021-05-29 DIAGNOSIS — Z87891 Personal history of nicotine dependence: Secondary | ICD-10-CM | POA: Diagnosis not present

## 2021-05-29 DIAGNOSIS — D631 Anemia in chronic kidney disease: Secondary | ICD-10-CM | POA: Diagnosis not present

## 2021-05-29 DIAGNOSIS — I130 Hypertensive heart and chronic kidney disease with heart failure and stage 1 through stage 4 chronic kidney disease, or unspecified chronic kidney disease: Secondary | ICD-10-CM | POA: Diagnosis not present

## 2021-05-29 DIAGNOSIS — J439 Emphysema, unspecified: Secondary | ICD-10-CM | POA: Diagnosis not present

## 2021-05-29 DIAGNOSIS — N183 Chronic kidney disease, stage 3 unspecified: Secondary | ICD-10-CM | POA: Diagnosis not present

## 2021-05-29 DIAGNOSIS — M1990 Unspecified osteoarthritis, unspecified site: Secondary | ICD-10-CM | POA: Diagnosis not present

## 2021-05-29 DIAGNOSIS — K7460 Unspecified cirrhosis of liver: Secondary | ICD-10-CM | POA: Diagnosis not present

## 2021-05-29 DIAGNOSIS — J9611 Chronic respiratory failure with hypoxia: Secondary | ICD-10-CM | POA: Diagnosis not present

## 2021-05-29 DIAGNOSIS — J90 Pleural effusion, not elsewhere classified: Secondary | ICD-10-CM | POA: Diagnosis not present

## 2021-05-29 DIAGNOSIS — I5033 Acute on chronic diastolic (congestive) heart failure: Secondary | ICD-10-CM | POA: Diagnosis not present

## 2021-05-29 DIAGNOSIS — I480 Paroxysmal atrial fibrillation: Secondary | ICD-10-CM | POA: Diagnosis not present

## 2021-05-29 DIAGNOSIS — Z9981 Dependence on supplemental oxygen: Secondary | ICD-10-CM | POA: Diagnosis not present

## 2021-05-29 DIAGNOSIS — J9612 Chronic respiratory failure with hypercapnia: Secondary | ICD-10-CM | POA: Diagnosis not present

## 2021-05-30 DIAGNOSIS — I272 Pulmonary hypertension, unspecified: Secondary | ICD-10-CM | POA: Diagnosis not present

## 2021-05-30 DIAGNOSIS — Z87891 Personal history of nicotine dependence: Secondary | ICD-10-CM | POA: Diagnosis not present

## 2021-05-30 DIAGNOSIS — I480 Paroxysmal atrial fibrillation: Secondary | ICD-10-CM | POA: Diagnosis not present

## 2021-05-30 DIAGNOSIS — I130 Hypertensive heart and chronic kidney disease with heart failure and stage 1 through stage 4 chronic kidney disease, or unspecified chronic kidney disease: Secondary | ICD-10-CM | POA: Diagnosis not present

## 2021-05-30 DIAGNOSIS — J90 Pleural effusion, not elsewhere classified: Secondary | ICD-10-CM | POA: Diagnosis not present

## 2021-05-30 DIAGNOSIS — J9611 Chronic respiratory failure with hypoxia: Secondary | ICD-10-CM | POA: Diagnosis not present

## 2021-05-30 DIAGNOSIS — Z9981 Dependence on supplemental oxygen: Secondary | ICD-10-CM | POA: Diagnosis not present

## 2021-05-30 DIAGNOSIS — E039 Hypothyroidism, unspecified: Secondary | ICD-10-CM | POA: Diagnosis not present

## 2021-05-30 DIAGNOSIS — D631 Anemia in chronic kidney disease: Secondary | ICD-10-CM | POA: Diagnosis not present

## 2021-05-30 DIAGNOSIS — M1990 Unspecified osteoarthritis, unspecified site: Secondary | ICD-10-CM | POA: Diagnosis not present

## 2021-05-30 DIAGNOSIS — K7460 Unspecified cirrhosis of liver: Secondary | ICD-10-CM | POA: Diagnosis not present

## 2021-05-30 DIAGNOSIS — J9612 Chronic respiratory failure with hypercapnia: Secondary | ICD-10-CM | POA: Diagnosis not present

## 2021-05-30 DIAGNOSIS — N183 Chronic kidney disease, stage 3 unspecified: Secondary | ICD-10-CM | POA: Diagnosis not present

## 2021-05-30 DIAGNOSIS — I5033 Acute on chronic diastolic (congestive) heart failure: Secondary | ICD-10-CM | POA: Diagnosis not present

## 2021-05-30 DIAGNOSIS — J439 Emphysema, unspecified: Secondary | ICD-10-CM | POA: Diagnosis not present

## 2021-05-31 ENCOUNTER — Other Ambulatory Visit: Payer: Self-pay | Admitting: Family Medicine

## 2021-06-02 NOTE — Telephone Encounter (Signed)
Last office visit 05/10/21 for hospital follow up.  Last refilled:  Hydroxyzine not on current medication list.  No future appointments with PCP.

## 2021-06-03 NOTE — Telephone Encounter (Signed)
Left message for patient to call us back to confirm is she is still needing refill on hydroxyzine.

## 2021-06-03 NOTE — Telephone Encounter (Signed)
Call patient tp determine if she is still requiring this.

## 2021-06-04 DIAGNOSIS — E039 Hypothyroidism, unspecified: Secondary | ICD-10-CM | POA: Diagnosis not present

## 2021-06-04 DIAGNOSIS — M1990 Unspecified osteoarthritis, unspecified site: Secondary | ICD-10-CM | POA: Diagnosis not present

## 2021-06-04 DIAGNOSIS — I130 Hypertensive heart and chronic kidney disease with heart failure and stage 1 through stage 4 chronic kidney disease, or unspecified chronic kidney disease: Secondary | ICD-10-CM | POA: Diagnosis not present

## 2021-06-04 DIAGNOSIS — I272 Pulmonary hypertension, unspecified: Secondary | ICD-10-CM | POA: Diagnosis not present

## 2021-06-04 DIAGNOSIS — Z87891 Personal history of nicotine dependence: Secondary | ICD-10-CM | POA: Diagnosis not present

## 2021-06-04 DIAGNOSIS — D631 Anemia in chronic kidney disease: Secondary | ICD-10-CM | POA: Diagnosis not present

## 2021-06-04 DIAGNOSIS — N183 Chronic kidney disease, stage 3 unspecified: Secondary | ICD-10-CM | POA: Diagnosis not present

## 2021-06-04 DIAGNOSIS — I5033 Acute on chronic diastolic (congestive) heart failure: Secondary | ICD-10-CM | POA: Diagnosis not present

## 2021-06-04 DIAGNOSIS — I480 Paroxysmal atrial fibrillation: Secondary | ICD-10-CM | POA: Diagnosis not present

## 2021-06-04 DIAGNOSIS — J9611 Chronic respiratory failure with hypoxia: Secondary | ICD-10-CM | POA: Diagnosis not present

## 2021-06-04 DIAGNOSIS — J90 Pleural effusion, not elsewhere classified: Secondary | ICD-10-CM | POA: Diagnosis not present

## 2021-06-04 DIAGNOSIS — J9612 Chronic respiratory failure with hypercapnia: Secondary | ICD-10-CM | POA: Diagnosis not present

## 2021-06-04 DIAGNOSIS — Z9981 Dependence on supplemental oxygen: Secondary | ICD-10-CM | POA: Diagnosis not present

## 2021-06-04 DIAGNOSIS — K7460 Unspecified cirrhosis of liver: Secondary | ICD-10-CM | POA: Diagnosis not present

## 2021-06-04 DIAGNOSIS — J439 Emphysema, unspecified: Secondary | ICD-10-CM | POA: Diagnosis not present

## 2021-06-04 NOTE — Telephone Encounter (Signed)
Spoke with Mr. Linda Stevens.  He states she is taking the hydroxyzine and they do need a refill.

## 2021-06-04 NOTE — Telephone Encounter (Signed)
Received fax from [facility] requesting PA for hydroxyzine.  PA completed on CoverMyMeds and sent for review.  Can take up to 72 hours for a decision.

## 2021-06-04 NOTE — Telephone Encounter (Signed)
Pt husband called returning your call  Please call [phone]

## 2021-06-05 DIAGNOSIS — K7460 Unspecified cirrhosis of liver: Secondary | ICD-10-CM | POA: Diagnosis not present

## 2021-06-05 DIAGNOSIS — I130 Hypertensive heart and chronic kidney disease with heart failure and stage 1 through stage 4 chronic kidney disease, or unspecified chronic kidney disease: Secondary | ICD-10-CM | POA: Diagnosis not present

## 2021-06-05 DIAGNOSIS — Z9981 Dependence on supplemental oxygen: Secondary | ICD-10-CM | POA: Diagnosis not present

## 2021-06-05 DIAGNOSIS — Z87891 Personal history of nicotine dependence: Secondary | ICD-10-CM | POA: Diagnosis not present

## 2021-06-05 DIAGNOSIS — I272 Pulmonary hypertension, unspecified: Secondary | ICD-10-CM | POA: Diagnosis not present

## 2021-06-05 DIAGNOSIS — M1990 Unspecified osteoarthritis, unspecified site: Secondary | ICD-10-CM | POA: Diagnosis not present

## 2021-06-05 DIAGNOSIS — D631 Anemia in chronic kidney disease: Secondary | ICD-10-CM | POA: Diagnosis not present

## 2021-06-05 DIAGNOSIS — J9612 Chronic respiratory failure with hypercapnia: Secondary | ICD-10-CM | POA: Diagnosis not present

## 2021-06-05 DIAGNOSIS — J90 Pleural effusion, not elsewhere classified: Secondary | ICD-10-CM | POA: Diagnosis not present

## 2021-06-05 DIAGNOSIS — E039 Hypothyroidism, unspecified: Secondary | ICD-10-CM | POA: Diagnosis not present

## 2021-06-05 DIAGNOSIS — I480 Paroxysmal atrial fibrillation: Secondary | ICD-10-CM | POA: Diagnosis not present

## 2021-06-05 DIAGNOSIS — I5033 Acute on chronic diastolic (congestive) heart failure: Secondary | ICD-10-CM | POA: Diagnosis not present

## 2021-06-05 DIAGNOSIS — N183 Chronic kidney disease, stage 3 unspecified: Secondary | ICD-10-CM | POA: Diagnosis not present

## 2021-06-05 DIAGNOSIS — J9611 Chronic respiratory failure with hypoxia: Secondary | ICD-10-CM | POA: Diagnosis not present

## 2021-06-05 DIAGNOSIS — J439 Emphysema, unspecified: Secondary | ICD-10-CM | POA: Diagnosis not present

## 2021-06-07 DIAGNOSIS — Z9981 Dependence on supplemental oxygen: Secondary | ICD-10-CM | POA: Diagnosis not present

## 2021-06-07 DIAGNOSIS — N183 Chronic kidney disease, stage 3 unspecified: Secondary | ICD-10-CM | POA: Diagnosis not present

## 2021-06-07 DIAGNOSIS — D631 Anemia in chronic kidney disease: Secondary | ICD-10-CM | POA: Diagnosis not present

## 2021-06-07 DIAGNOSIS — J90 Pleural effusion, not elsewhere classified: Secondary | ICD-10-CM | POA: Diagnosis not present

## 2021-06-07 DIAGNOSIS — I480 Paroxysmal atrial fibrillation: Secondary | ICD-10-CM | POA: Diagnosis not present

## 2021-06-07 DIAGNOSIS — K7460 Unspecified cirrhosis of liver: Secondary | ICD-10-CM | POA: Diagnosis not present

## 2021-06-07 DIAGNOSIS — J45909 Unspecified asthma, uncomplicated: Secondary | ICD-10-CM | POA: Diagnosis not present

## 2021-06-07 DIAGNOSIS — E039 Hypothyroidism, unspecified: Secondary | ICD-10-CM | POA: Diagnosis not present

## 2021-06-07 DIAGNOSIS — I130 Hypertensive heart and chronic kidney disease with heart failure and stage 1 through stage 4 chronic kidney disease, or unspecified chronic kidney disease: Secondary | ICD-10-CM | POA: Diagnosis not present

## 2021-06-07 DIAGNOSIS — M1990 Unspecified osteoarthritis, unspecified site: Secondary | ICD-10-CM | POA: Diagnosis not present

## 2021-06-07 DIAGNOSIS — J9612 Chronic respiratory failure with hypercapnia: Secondary | ICD-10-CM | POA: Diagnosis not present

## 2021-06-07 DIAGNOSIS — J9611 Chronic respiratory failure with hypoxia: Secondary | ICD-10-CM | POA: Diagnosis not present

## 2021-06-07 DIAGNOSIS — Z87891 Personal history of nicotine dependence: Secondary | ICD-10-CM | POA: Diagnosis not present

## 2021-06-07 DIAGNOSIS — J449 Chronic obstructive pulmonary disease, unspecified: Secondary | ICD-10-CM | POA: Diagnosis not present

## 2021-06-07 DIAGNOSIS — I5033 Acute on chronic diastolic (congestive) heart failure: Secondary | ICD-10-CM | POA: Diagnosis not present

## 2021-06-07 DIAGNOSIS — J9601 Acute respiratory failure with hypoxia: Secondary | ICD-10-CM | POA: Diagnosis not present

## 2021-06-07 DIAGNOSIS — R062 Wheezing: Secondary | ICD-10-CM | POA: Diagnosis not present

## 2021-06-07 DIAGNOSIS — J439 Emphysema, unspecified: Secondary | ICD-10-CM | POA: Diagnosis not present

## 2021-06-07 DIAGNOSIS — I272 Pulmonary hypertension, unspecified: Secondary | ICD-10-CM | POA: Diagnosis not present

## 2021-06-10 DIAGNOSIS — I480 Paroxysmal atrial fibrillation: Secondary | ICD-10-CM | POA: Diagnosis not present

## 2021-06-10 DIAGNOSIS — Z87891 Personal history of nicotine dependence: Secondary | ICD-10-CM | POA: Diagnosis not present

## 2021-06-10 DIAGNOSIS — Z9981 Dependence on supplemental oxygen: Secondary | ICD-10-CM | POA: Diagnosis not present

## 2021-06-10 DIAGNOSIS — M1990 Unspecified osteoarthritis, unspecified site: Secondary | ICD-10-CM | POA: Diagnosis not present

## 2021-06-10 DIAGNOSIS — K7460 Unspecified cirrhosis of liver: Secondary | ICD-10-CM | POA: Diagnosis not present

## 2021-06-10 DIAGNOSIS — D631 Anemia in chronic kidney disease: Secondary | ICD-10-CM | POA: Diagnosis not present

## 2021-06-10 DIAGNOSIS — N183 Chronic kidney disease, stage 3 unspecified: Secondary | ICD-10-CM | POA: Diagnosis not present

## 2021-06-10 DIAGNOSIS — E039 Hypothyroidism, unspecified: Secondary | ICD-10-CM | POA: Diagnosis not present

## 2021-06-10 DIAGNOSIS — J90 Pleural effusion, not elsewhere classified: Secondary | ICD-10-CM | POA: Diagnosis not present

## 2021-06-10 DIAGNOSIS — I5033 Acute on chronic diastolic (congestive) heart failure: Secondary | ICD-10-CM | POA: Diagnosis not present

## 2021-06-10 DIAGNOSIS — J9611 Chronic respiratory failure with hypoxia: Secondary | ICD-10-CM | POA: Diagnosis not present

## 2021-06-10 DIAGNOSIS — I272 Pulmonary hypertension, unspecified: Secondary | ICD-10-CM | POA: Diagnosis not present

## 2021-06-10 DIAGNOSIS — I130 Hypertensive heart and chronic kidney disease with heart failure and stage 1 through stage 4 chronic kidney disease, or unspecified chronic kidney disease: Secondary | ICD-10-CM | POA: Diagnosis not present

## 2021-06-10 DIAGNOSIS — J439 Emphysema, unspecified: Secondary | ICD-10-CM | POA: Diagnosis not present

## 2021-06-10 DIAGNOSIS — J9612 Chronic respiratory failure with hypercapnia: Secondary | ICD-10-CM | POA: Diagnosis not present

## 2021-06-10 NOTE — Assessment & Plan Note (Signed)
Close follow up with CHF clinic as planned.

## 2021-06-10 NOTE — Assessment & Plan Note (Signed)
Only very gentle diuresis given  Hypotension.. currently asymptomatic.

## 2021-06-10 NOTE — Assessment & Plan Note (Signed)
Completing course of oral cefdinir. Keep up with po intake given mild diarrhea. Pt reports she can tolerate until she is done.

## 2021-06-10 NOTE — Assessment & Plan Note (Addendum)
Feels she has SE to levothyroxine. Willing to start very low dose and titrate up.  Try very lowest dose of levothyroxine... take 1/2 tab of the 50 mcg have at home.. if it cause sedation.. call.  recheck in 4-6 weeks.

## 2021-06-10 NOTE — Assessment & Plan Note (Addendum)
Due to COPD exacerbation  With concurrent pneumonia.  Continue continuous oxygen 4 L [place] Follow O2 sats.

## 2021-06-11 DIAGNOSIS — I272 Pulmonary hypertension, unspecified: Secondary | ICD-10-CM | POA: Diagnosis not present

## 2021-06-11 DIAGNOSIS — I480 Paroxysmal atrial fibrillation: Secondary | ICD-10-CM | POA: Diagnosis not present

## 2021-06-11 DIAGNOSIS — J9612 Chronic respiratory failure with hypercapnia: Secondary | ICD-10-CM | POA: Diagnosis not present

## 2021-06-11 DIAGNOSIS — I130 Hypertensive heart and chronic kidney disease with heart failure and stage 1 through stage 4 chronic kidney disease, or unspecified chronic kidney disease: Secondary | ICD-10-CM | POA: Diagnosis not present

## 2021-06-11 DIAGNOSIS — Z87891 Personal history of nicotine dependence: Secondary | ICD-10-CM | POA: Diagnosis not present

## 2021-06-11 DIAGNOSIS — K7460 Unspecified cirrhosis of liver: Secondary | ICD-10-CM | POA: Diagnosis not present

## 2021-06-11 DIAGNOSIS — J9611 Chronic respiratory failure with hypoxia: Secondary | ICD-10-CM | POA: Diagnosis not present

## 2021-06-11 DIAGNOSIS — N183 Chronic kidney disease, stage 3 unspecified: Secondary | ICD-10-CM | POA: Diagnosis not present

## 2021-06-11 DIAGNOSIS — J439 Emphysema, unspecified: Secondary | ICD-10-CM | POA: Diagnosis not present

## 2021-06-11 DIAGNOSIS — E039 Hypothyroidism, unspecified: Secondary | ICD-10-CM | POA: Diagnosis not present

## 2021-06-11 DIAGNOSIS — Z9981 Dependence on supplemental oxygen: Secondary | ICD-10-CM | POA: Diagnosis not present

## 2021-06-11 DIAGNOSIS — M1990 Unspecified osteoarthritis, unspecified site: Secondary | ICD-10-CM | POA: Diagnosis not present

## 2021-06-11 DIAGNOSIS — D631 Anemia in chronic kidney disease: Secondary | ICD-10-CM | POA: Diagnosis not present

## 2021-06-11 DIAGNOSIS — I5033 Acute on chronic diastolic (congestive) heart failure: Secondary | ICD-10-CM | POA: Diagnosis not present

## 2021-06-11 DIAGNOSIS — J90 Pleural effusion, not elsewhere classified: Secondary | ICD-10-CM | POA: Diagnosis not present

## 2021-06-14 ENCOUNTER — Ambulatory Visit (INDEPENDENT_AMBULATORY_CARE_PROVIDER_SITE_OTHER): Payer: Medicare HMO

## 2021-06-14 DIAGNOSIS — I509 Heart failure, unspecified: Secondary | ICD-10-CM

## 2021-06-14 DIAGNOSIS — J449 Chronic obstructive pulmonary disease, unspecified: Secondary | ICD-10-CM

## 2021-06-14 NOTE — Chronic Care Management (AMB) (Signed)
   06/14/2021  Linda Stevens 04/26/1946 [id number]  Telephone call to patient's spouse/ DPR, [name]. HIPAA verified by spouse for patient. Spouse states he is still not ready to engage chronic care management services. He reports patient continues to receive services with home health and would rather engage case management services after home health complete.  Spouse request call back in 6-8 weeks.   Quinn Plowman RN,BSN,CCM RN Case Manager [place]  [phone]

## 2021-06-18 DIAGNOSIS — E039 Hypothyroidism, unspecified: Secondary | ICD-10-CM | POA: Diagnosis not present

## 2021-06-18 DIAGNOSIS — Z9981 Dependence on supplemental oxygen: Secondary | ICD-10-CM | POA: Diagnosis not present

## 2021-06-18 DIAGNOSIS — N183 Chronic kidney disease, stage 3 unspecified: Secondary | ICD-10-CM | POA: Diagnosis not present

## 2021-06-18 DIAGNOSIS — I5033 Acute on chronic diastolic (congestive) heart failure: Secondary | ICD-10-CM | POA: Diagnosis not present

## 2021-06-18 DIAGNOSIS — I130 Hypertensive heart and chronic kidney disease with heart failure and stage 1 through stage 4 chronic kidney disease, or unspecified chronic kidney disease: Secondary | ICD-10-CM | POA: Diagnosis not present

## 2021-06-18 DIAGNOSIS — I480 Paroxysmal atrial fibrillation: Secondary | ICD-10-CM | POA: Diagnosis not present

## 2021-06-18 DIAGNOSIS — K7460 Unspecified cirrhosis of liver: Secondary | ICD-10-CM | POA: Diagnosis not present

## 2021-06-18 DIAGNOSIS — J90 Pleural effusion, not elsewhere classified: Secondary | ICD-10-CM | POA: Diagnosis not present

## 2021-06-18 DIAGNOSIS — J9611 Chronic respiratory failure with hypoxia: Secondary | ICD-10-CM | POA: Diagnosis not present

## 2021-06-18 DIAGNOSIS — J9612 Chronic respiratory failure with hypercapnia: Secondary | ICD-10-CM | POA: Diagnosis not present

## 2021-06-18 DIAGNOSIS — M1990 Unspecified osteoarthritis, unspecified site: Secondary | ICD-10-CM | POA: Diagnosis not present

## 2021-06-18 DIAGNOSIS — Z87891 Personal history of nicotine dependence: Secondary | ICD-10-CM | POA: Diagnosis not present

## 2021-06-18 DIAGNOSIS — I272 Pulmonary hypertension, unspecified: Secondary | ICD-10-CM | POA: Diagnosis not present

## 2021-06-18 DIAGNOSIS — J439 Emphysema, unspecified: Secondary | ICD-10-CM | POA: Diagnosis not present

## 2021-06-18 DIAGNOSIS — D631 Anemia in chronic kidney disease: Secondary | ICD-10-CM | POA: Diagnosis not present

## 2021-06-19 DIAGNOSIS — D631 Anemia in chronic kidney disease: Secondary | ICD-10-CM | POA: Diagnosis not present

## 2021-06-19 DIAGNOSIS — J9612 Chronic respiratory failure with hypercapnia: Secondary | ICD-10-CM | POA: Diagnosis not present

## 2021-06-19 DIAGNOSIS — I272 Pulmonary hypertension, unspecified: Secondary | ICD-10-CM | POA: Diagnosis not present

## 2021-06-19 DIAGNOSIS — J439 Emphysema, unspecified: Secondary | ICD-10-CM | POA: Diagnosis not present

## 2021-06-19 DIAGNOSIS — J90 Pleural effusion, not elsewhere classified: Secondary | ICD-10-CM | POA: Diagnosis not present

## 2021-06-19 DIAGNOSIS — I480 Paroxysmal atrial fibrillation: Secondary | ICD-10-CM | POA: Diagnosis not present

## 2021-06-19 DIAGNOSIS — K7460 Unspecified cirrhosis of liver: Secondary | ICD-10-CM | POA: Diagnosis not present

## 2021-06-19 DIAGNOSIS — M1990 Unspecified osteoarthritis, unspecified site: Secondary | ICD-10-CM | POA: Diagnosis not present

## 2021-06-19 DIAGNOSIS — I130 Hypertensive heart and chronic kidney disease with heart failure and stage 1 through stage 4 chronic kidney disease, or unspecified chronic kidney disease: Secondary | ICD-10-CM | POA: Diagnosis not present

## 2021-06-19 DIAGNOSIS — N183 Chronic kidney disease, stage 3 unspecified: Secondary | ICD-10-CM | POA: Diagnosis not present

## 2021-06-19 DIAGNOSIS — Z9981 Dependence on supplemental oxygen: Secondary | ICD-10-CM | POA: Diagnosis not present

## 2021-06-19 DIAGNOSIS — Z87891 Personal history of nicotine dependence: Secondary | ICD-10-CM | POA: Diagnosis not present

## 2021-06-19 DIAGNOSIS — J9611 Chronic respiratory failure with hypoxia: Secondary | ICD-10-CM | POA: Diagnosis not present

## 2021-06-19 DIAGNOSIS — I5033 Acute on chronic diastolic (congestive) heart failure: Secondary | ICD-10-CM | POA: Diagnosis not present

## 2021-06-19 DIAGNOSIS — E039 Hypothyroidism, unspecified: Secondary | ICD-10-CM | POA: Diagnosis not present

## 2021-06-24 DIAGNOSIS — N183 Chronic kidney disease, stage 3 unspecified: Secondary | ICD-10-CM | POA: Diagnosis not present

## 2021-06-24 DIAGNOSIS — M1990 Unspecified osteoarthritis, unspecified site: Secondary | ICD-10-CM | POA: Diagnosis not present

## 2021-06-24 DIAGNOSIS — Z9981 Dependence on supplemental oxygen: Secondary | ICD-10-CM | POA: Diagnosis not present

## 2021-06-24 DIAGNOSIS — I480 Paroxysmal atrial fibrillation: Secondary | ICD-10-CM | POA: Diagnosis not present

## 2021-06-24 DIAGNOSIS — J90 Pleural effusion, not elsewhere classified: Secondary | ICD-10-CM | POA: Diagnosis not present

## 2021-06-24 DIAGNOSIS — K7460 Unspecified cirrhosis of liver: Secondary | ICD-10-CM | POA: Diagnosis not present

## 2021-06-24 DIAGNOSIS — I130 Hypertensive heart and chronic kidney disease with heart failure and stage 1 through stage 4 chronic kidney disease, or unspecified chronic kidney disease: Secondary | ICD-10-CM | POA: Diagnosis not present

## 2021-06-24 DIAGNOSIS — E039 Hypothyroidism, unspecified: Secondary | ICD-10-CM | POA: Diagnosis not present

## 2021-06-24 DIAGNOSIS — J439 Emphysema, unspecified: Secondary | ICD-10-CM | POA: Diagnosis not present

## 2021-06-24 DIAGNOSIS — I5033 Acute on chronic diastolic (congestive) heart failure: Secondary | ICD-10-CM | POA: Diagnosis not present

## 2021-06-24 DIAGNOSIS — D631 Anemia in chronic kidney disease: Secondary | ICD-10-CM | POA: Diagnosis not present

## 2021-06-24 DIAGNOSIS — Z87891 Personal history of nicotine dependence: Secondary | ICD-10-CM | POA: Diagnosis not present

## 2021-06-24 DIAGNOSIS — I272 Pulmonary hypertension, unspecified: Secondary | ICD-10-CM | POA: Diagnosis not present

## 2021-06-24 DIAGNOSIS — J9611 Chronic respiratory failure with hypoxia: Secondary | ICD-10-CM | POA: Diagnosis not present

## 2021-06-24 DIAGNOSIS — J9612 Chronic respiratory failure with hypercapnia: Secondary | ICD-10-CM | POA: Diagnosis not present

## 2021-06-26 DIAGNOSIS — M1990 Unspecified osteoarthritis, unspecified site: Secondary | ICD-10-CM | POA: Diagnosis not present

## 2021-06-26 DIAGNOSIS — E039 Hypothyroidism, unspecified: Secondary | ICD-10-CM | POA: Diagnosis not present

## 2021-06-26 DIAGNOSIS — I5033 Acute on chronic diastolic (congestive) heart failure: Secondary | ICD-10-CM | POA: Diagnosis not present

## 2021-06-26 DIAGNOSIS — I130 Hypertensive heart and chronic kidney disease with heart failure and stage 1 through stage 4 chronic kidney disease, or unspecified chronic kidney disease: Secondary | ICD-10-CM | POA: Diagnosis not present

## 2021-06-26 DIAGNOSIS — J439 Emphysema, unspecified: Secondary | ICD-10-CM | POA: Diagnosis not present

## 2021-06-26 DIAGNOSIS — N183 Chronic kidney disease, stage 3 unspecified: Secondary | ICD-10-CM | POA: Diagnosis not present

## 2021-06-26 DIAGNOSIS — I480 Paroxysmal atrial fibrillation: Secondary | ICD-10-CM | POA: Diagnosis not present

## 2021-06-26 DIAGNOSIS — Z9981 Dependence on supplemental oxygen: Secondary | ICD-10-CM | POA: Diagnosis not present

## 2021-06-26 DIAGNOSIS — D631 Anemia in chronic kidney disease: Secondary | ICD-10-CM | POA: Diagnosis not present

## 2021-06-26 DIAGNOSIS — K7460 Unspecified cirrhosis of liver: Secondary | ICD-10-CM | POA: Diagnosis not present

## 2021-06-26 DIAGNOSIS — J9611 Chronic respiratory failure with hypoxia: Secondary | ICD-10-CM | POA: Diagnosis not present

## 2021-06-26 DIAGNOSIS — I272 Pulmonary hypertension, unspecified: Secondary | ICD-10-CM | POA: Diagnosis not present

## 2021-06-26 DIAGNOSIS — J90 Pleural effusion, not elsewhere classified: Secondary | ICD-10-CM | POA: Diagnosis not present

## 2021-06-26 DIAGNOSIS — J9612 Chronic respiratory failure with hypercapnia: Secondary | ICD-10-CM | POA: Diagnosis not present

## 2021-06-26 DIAGNOSIS — Z87891 Personal history of nicotine dependence: Secondary | ICD-10-CM | POA: Diagnosis not present

## 2021-07-03 ENCOUNTER — Other Ambulatory Visit (HOSPITAL_COMMUNITY): Payer: Self-pay | Admitting: Internal Medicine

## 2021-07-03 DIAGNOSIS — J9612 Chronic respiratory failure with hypercapnia: Secondary | ICD-10-CM | POA: Diagnosis not present

## 2021-07-03 DIAGNOSIS — J90 Pleural effusion, not elsewhere classified: Secondary | ICD-10-CM | POA: Diagnosis not present

## 2021-07-03 DIAGNOSIS — K7460 Unspecified cirrhosis of liver: Secondary | ICD-10-CM | POA: Diagnosis not present

## 2021-07-03 DIAGNOSIS — Z9981 Dependence on supplemental oxygen: Secondary | ICD-10-CM | POA: Diagnosis not present

## 2021-07-03 DIAGNOSIS — N183 Chronic kidney disease, stage 3 unspecified: Secondary | ICD-10-CM | POA: Diagnosis not present

## 2021-07-03 DIAGNOSIS — I480 Paroxysmal atrial fibrillation: Secondary | ICD-10-CM | POA: Diagnosis not present

## 2021-07-03 DIAGNOSIS — I130 Hypertensive heart and chronic kidney disease with heart failure and stage 1 through stage 4 chronic kidney disease, or unspecified chronic kidney disease: Secondary | ICD-10-CM | POA: Diagnosis not present

## 2021-07-03 DIAGNOSIS — E039 Hypothyroidism, unspecified: Secondary | ICD-10-CM | POA: Diagnosis not present

## 2021-07-03 DIAGNOSIS — J9611 Chronic respiratory failure with hypoxia: Secondary | ICD-10-CM | POA: Diagnosis not present

## 2021-07-03 DIAGNOSIS — Z87891 Personal history of nicotine dependence: Secondary | ICD-10-CM | POA: Diagnosis not present

## 2021-07-03 DIAGNOSIS — D631 Anemia in chronic kidney disease: Secondary | ICD-10-CM | POA: Diagnosis not present

## 2021-07-03 DIAGNOSIS — I5033 Acute on chronic diastolic (congestive) heart failure: Secondary | ICD-10-CM | POA: Diagnosis not present

## 2021-07-03 DIAGNOSIS — J439 Emphysema, unspecified: Secondary | ICD-10-CM | POA: Diagnosis not present

## 2021-07-03 DIAGNOSIS — I272 Pulmonary hypertension, unspecified: Secondary | ICD-10-CM | POA: Diagnosis not present

## 2021-07-03 DIAGNOSIS — M1990 Unspecified osteoarthritis, unspecified site: Secondary | ICD-10-CM | POA: Diagnosis not present

## 2021-07-04 DIAGNOSIS — E039 Hypothyroidism, unspecified: Secondary | ICD-10-CM | POA: Diagnosis not present

## 2021-07-04 DIAGNOSIS — I272 Pulmonary hypertension, unspecified: Secondary | ICD-10-CM | POA: Diagnosis not present

## 2021-07-04 DIAGNOSIS — J9612 Chronic respiratory failure with hypercapnia: Secondary | ICD-10-CM | POA: Diagnosis not present

## 2021-07-04 DIAGNOSIS — N183 Chronic kidney disease, stage 3 unspecified: Secondary | ICD-10-CM | POA: Diagnosis not present

## 2021-07-04 DIAGNOSIS — I5033 Acute on chronic diastolic (congestive) heart failure: Secondary | ICD-10-CM | POA: Diagnosis not present

## 2021-07-04 DIAGNOSIS — M1990 Unspecified osteoarthritis, unspecified site: Secondary | ICD-10-CM | POA: Diagnosis not present

## 2021-07-04 DIAGNOSIS — Z9981 Dependence on supplemental oxygen: Secondary | ICD-10-CM | POA: Diagnosis not present

## 2021-07-04 DIAGNOSIS — J439 Emphysema, unspecified: Secondary | ICD-10-CM | POA: Diagnosis not present

## 2021-07-04 DIAGNOSIS — J90 Pleural effusion, not elsewhere classified: Secondary | ICD-10-CM | POA: Diagnosis not present

## 2021-07-04 DIAGNOSIS — Z87891 Personal history of nicotine dependence: Secondary | ICD-10-CM | POA: Diagnosis not present

## 2021-07-04 DIAGNOSIS — J9611 Chronic respiratory failure with hypoxia: Secondary | ICD-10-CM | POA: Diagnosis not present

## 2021-07-04 DIAGNOSIS — D631 Anemia in chronic kidney disease: Secondary | ICD-10-CM | POA: Diagnosis not present

## 2021-07-04 DIAGNOSIS — I130 Hypertensive heart and chronic kidney disease with heart failure and stage 1 through stage 4 chronic kidney disease, or unspecified chronic kidney disease: Secondary | ICD-10-CM | POA: Diagnosis not present

## 2021-07-04 DIAGNOSIS — K7460 Unspecified cirrhosis of liver: Secondary | ICD-10-CM | POA: Diagnosis not present

## 2021-07-04 DIAGNOSIS — I480 Paroxysmal atrial fibrillation: Secondary | ICD-10-CM | POA: Diagnosis not present

## 2021-07-08 DIAGNOSIS — Z87891 Personal history of nicotine dependence: Secondary | ICD-10-CM | POA: Diagnosis not present

## 2021-07-08 DIAGNOSIS — M1990 Unspecified osteoarthritis, unspecified site: Secondary | ICD-10-CM | POA: Diagnosis not present

## 2021-07-08 DIAGNOSIS — J9612 Chronic respiratory failure with hypercapnia: Secondary | ICD-10-CM | POA: Diagnosis not present

## 2021-07-08 DIAGNOSIS — R062 Wheezing: Secondary | ICD-10-CM | POA: Diagnosis not present

## 2021-07-08 DIAGNOSIS — J449 Chronic obstructive pulmonary disease, unspecified: Secondary | ICD-10-CM | POA: Diagnosis not present

## 2021-07-08 DIAGNOSIS — D631 Anemia in chronic kidney disease: Secondary | ICD-10-CM | POA: Diagnosis not present

## 2021-07-08 DIAGNOSIS — I130 Hypertensive heart and chronic kidney disease with heart failure and stage 1 through stage 4 chronic kidney disease, or unspecified chronic kidney disease: Secondary | ICD-10-CM | POA: Diagnosis not present

## 2021-07-08 DIAGNOSIS — I509 Heart failure, unspecified: Secondary | ICD-10-CM | POA: Diagnosis not present

## 2021-07-08 DIAGNOSIS — J439 Emphysema, unspecified: Secondary | ICD-10-CM | POA: Diagnosis not present

## 2021-07-08 DIAGNOSIS — J45909 Unspecified asthma, uncomplicated: Secondary | ICD-10-CM | POA: Diagnosis not present

## 2021-07-08 DIAGNOSIS — I272 Pulmonary hypertension, unspecified: Secondary | ICD-10-CM | POA: Diagnosis not present

## 2021-07-08 DIAGNOSIS — J9601 Acute respiratory failure with hypoxia: Secondary | ICD-10-CM | POA: Diagnosis not present

## 2021-07-08 DIAGNOSIS — Z9981 Dependence on supplemental oxygen: Secondary | ICD-10-CM | POA: Diagnosis not present

## 2021-07-08 DIAGNOSIS — E039 Hypothyroidism, unspecified: Secondary | ICD-10-CM | POA: Diagnosis not present

## 2021-07-08 DIAGNOSIS — N183 Chronic kidney disease, stage 3 unspecified: Secondary | ICD-10-CM | POA: Diagnosis not present

## 2021-07-08 DIAGNOSIS — J9611 Chronic respiratory failure with hypoxia: Secondary | ICD-10-CM | POA: Diagnosis not present

## 2021-07-08 DIAGNOSIS — I5033 Acute on chronic diastolic (congestive) heart failure: Secondary | ICD-10-CM | POA: Diagnosis not present

## 2021-07-08 DIAGNOSIS — J90 Pleural effusion, not elsewhere classified: Secondary | ICD-10-CM | POA: Diagnosis not present

## 2021-07-08 DIAGNOSIS — K7460 Unspecified cirrhosis of liver: Secondary | ICD-10-CM | POA: Diagnosis not present

## 2021-07-08 DIAGNOSIS — I480 Paroxysmal atrial fibrillation: Secondary | ICD-10-CM | POA: Diagnosis not present

## 2021-07-09 ENCOUNTER — Telehealth: Payer: Medicare HMO

## 2021-07-09 ENCOUNTER — Telehealth: Payer: Self-pay

## 2021-07-09 DIAGNOSIS — I130 Hypertensive heart and chronic kidney disease with heart failure and stage 1 through stage 4 chronic kidney disease, or unspecified chronic kidney disease: Secondary | ICD-10-CM | POA: Diagnosis not present

## 2021-07-09 NOTE — Telephone Encounter (Signed)
  Care Management   Follow Up Note   07/09/2021 Name: Linda Stevens MRN: [id number] DOB: 08/03/1946   Referred by: [name], MD Reason for referral : Chronic Care Management (Initial assessment)   An unsuccessful telephone outreach was attempted today. The patient was referred to the case management team for assistance with care management and care coordination.  and Successful contact was made with the patient to discuss care management and care coordination services. Patient declines engagement at this time. HIPAA verified by patients spouse for patient. Spouse states patient has 4 more weeks of home health services. He request return call at that time to engage embedded case management/ chronic disease program.   Follow Up Plan: The care management team will reach out to the patient again over the next 4-6 weeks .    Quinn Plowman RN,BSN,CCM RN Case Manager [place]  [phone]

## 2021-07-12 DIAGNOSIS — M1990 Unspecified osteoarthritis, unspecified site: Secondary | ICD-10-CM | POA: Diagnosis not present

## 2021-07-12 DIAGNOSIS — I272 Pulmonary hypertension, unspecified: Secondary | ICD-10-CM | POA: Diagnosis not present

## 2021-07-12 DIAGNOSIS — J439 Emphysema, unspecified: Secondary | ICD-10-CM | POA: Diagnosis not present

## 2021-07-12 DIAGNOSIS — N183 Chronic kidney disease, stage 3 unspecified: Secondary | ICD-10-CM | POA: Diagnosis not present

## 2021-07-12 DIAGNOSIS — D631 Anemia in chronic kidney disease: Secondary | ICD-10-CM | POA: Diagnosis not present

## 2021-07-12 DIAGNOSIS — J9611 Chronic respiratory failure with hypoxia: Secondary | ICD-10-CM | POA: Diagnosis not present

## 2021-07-12 DIAGNOSIS — J90 Pleural effusion, not elsewhere classified: Secondary | ICD-10-CM | POA: Diagnosis not present

## 2021-07-12 DIAGNOSIS — I5033 Acute on chronic diastolic (congestive) heart failure: Secondary | ICD-10-CM | POA: Diagnosis not present

## 2021-07-12 DIAGNOSIS — J9612 Chronic respiratory failure with hypercapnia: Secondary | ICD-10-CM | POA: Diagnosis not present

## 2021-07-12 DIAGNOSIS — I130 Hypertensive heart and chronic kidney disease with heart failure and stage 1 through stage 4 chronic kidney disease, or unspecified chronic kidney disease: Secondary | ICD-10-CM | POA: Diagnosis not present

## 2021-07-18 ENCOUNTER — Other Ambulatory Visit: Payer: Self-pay | Admitting: Family Medicine

## 2021-07-19 ENCOUNTER — Other Ambulatory Visit: Payer: Self-pay | Admitting: Family Medicine

## 2021-07-19 DIAGNOSIS — M1990 Unspecified osteoarthritis, unspecified site: Secondary | ICD-10-CM | POA: Diagnosis not present

## 2021-07-19 DIAGNOSIS — J9611 Chronic respiratory failure with hypoxia: Secondary | ICD-10-CM | POA: Diagnosis not present

## 2021-07-19 DIAGNOSIS — J90 Pleural effusion, not elsewhere classified: Secondary | ICD-10-CM | POA: Diagnosis not present

## 2021-07-19 DIAGNOSIS — J439 Emphysema, unspecified: Secondary | ICD-10-CM | POA: Diagnosis not present

## 2021-07-19 DIAGNOSIS — I272 Pulmonary hypertension, unspecified: Secondary | ICD-10-CM | POA: Diagnosis not present

## 2021-07-19 DIAGNOSIS — I130 Hypertensive heart and chronic kidney disease with heart failure and stage 1 through stage 4 chronic kidney disease, or unspecified chronic kidney disease: Secondary | ICD-10-CM | POA: Diagnosis not present

## 2021-07-19 DIAGNOSIS — J9612 Chronic respiratory failure with hypercapnia: Secondary | ICD-10-CM | POA: Diagnosis not present

## 2021-07-19 DIAGNOSIS — N183 Chronic kidney disease, stage 3 unspecified: Secondary | ICD-10-CM | POA: Diagnosis not present

## 2021-07-19 DIAGNOSIS — D631 Anemia in chronic kidney disease: Secondary | ICD-10-CM | POA: Diagnosis not present

## 2021-07-19 DIAGNOSIS — I5033 Acute on chronic diastolic (congestive) heart failure: Secondary | ICD-10-CM | POA: Diagnosis not present

## 2021-07-19 MED ORDER — ALBUTEROL SULFATE HFA 108 (90 BASE) MCG/ACT IN AERS: INHALATION_SPRAY | RESPIRATORY_TRACT | 0 refills | Status: DC

## 2021-07-19 NOTE — Addendum Note (Signed)
Addended by: Carter Kitten on: 07/19/2021 09:13 AM   Modules accepted: Orders

## 2021-07-22 DIAGNOSIS — I272 Pulmonary hypertension, unspecified: Secondary | ICD-10-CM | POA: Diagnosis not present

## 2021-07-22 DIAGNOSIS — N183 Chronic kidney disease, stage 3 unspecified: Secondary | ICD-10-CM | POA: Diagnosis not present

## 2021-07-22 DIAGNOSIS — J90 Pleural effusion, not elsewhere classified: Secondary | ICD-10-CM | POA: Diagnosis not present

## 2021-07-22 DIAGNOSIS — J439 Emphysema, unspecified: Secondary | ICD-10-CM | POA: Diagnosis not present

## 2021-07-22 DIAGNOSIS — J9611 Chronic respiratory failure with hypoxia: Secondary | ICD-10-CM | POA: Diagnosis not present

## 2021-07-22 DIAGNOSIS — I5033 Acute on chronic diastolic (congestive) heart failure: Secondary | ICD-10-CM | POA: Diagnosis not present

## 2021-07-22 DIAGNOSIS — D631 Anemia in chronic kidney disease: Secondary | ICD-10-CM | POA: Diagnosis not present

## 2021-07-22 DIAGNOSIS — M1990 Unspecified osteoarthritis, unspecified site: Secondary | ICD-10-CM | POA: Diagnosis not present

## 2021-07-22 DIAGNOSIS — J9612 Chronic respiratory failure with hypercapnia: Secondary | ICD-10-CM | POA: Diagnosis not present

## 2021-07-22 DIAGNOSIS — I130 Hypertensive heart and chronic kidney disease with heart failure and stage 1 through stage 4 chronic kidney disease, or unspecified chronic kidney disease: Secondary | ICD-10-CM | POA: Diagnosis not present

## 2021-07-24 DIAGNOSIS — J9612 Chronic respiratory failure with hypercapnia: Secondary | ICD-10-CM | POA: Diagnosis not present

## 2021-07-24 DIAGNOSIS — M1990 Unspecified osteoarthritis, unspecified site: Secondary | ICD-10-CM | POA: Diagnosis not present

## 2021-07-24 DIAGNOSIS — I272 Pulmonary hypertension, unspecified: Secondary | ICD-10-CM | POA: Diagnosis not present

## 2021-07-24 DIAGNOSIS — J90 Pleural effusion, not elsewhere classified: Secondary | ICD-10-CM | POA: Diagnosis not present

## 2021-07-24 DIAGNOSIS — J9611 Chronic respiratory failure with hypoxia: Secondary | ICD-10-CM | POA: Diagnosis not present

## 2021-07-24 DIAGNOSIS — I5033 Acute on chronic diastolic (congestive) heart failure: Secondary | ICD-10-CM | POA: Diagnosis not present

## 2021-07-24 DIAGNOSIS — I130 Hypertensive heart and chronic kidney disease with heart failure and stage 1 through stage 4 chronic kidney disease, or unspecified chronic kidney disease: Secondary | ICD-10-CM | POA: Diagnosis not present

## 2021-07-24 DIAGNOSIS — D631 Anemia in chronic kidney disease: Secondary | ICD-10-CM | POA: Diagnosis not present

## 2021-07-24 DIAGNOSIS — J439 Emphysema, unspecified: Secondary | ICD-10-CM | POA: Diagnosis not present

## 2021-07-24 DIAGNOSIS — N183 Chronic kidney disease, stage 3 unspecified: Secondary | ICD-10-CM | POA: Diagnosis not present

## 2021-07-30 ENCOUNTER — Telehealth: Payer: Self-pay | Admitting: Family Medicine

## 2021-07-30 NOTE — Telephone Encounter (Signed)
Left message for Linda Stevens giving verbal orders to collect urine culture for frequent urination per Dr. Diona Browner.

## 2021-07-30 NOTE — Telephone Encounter (Signed)
Garden from [facility] called wanting to know if Dr.Bedsole would give a verbal for a urine culture she is having frequent urination

## 2021-07-30 NOTE — Telephone Encounter (Signed)
Okay for verbal order

## 2021-07-31 ENCOUNTER — Encounter: Payer: Self-pay | Admitting: Family Medicine

## 2021-07-31 DIAGNOSIS — J439 Emphysema, unspecified: Secondary | ICD-10-CM | POA: Diagnosis not present

## 2021-07-31 DIAGNOSIS — J9612 Chronic respiratory failure with hypercapnia: Secondary | ICD-10-CM | POA: Diagnosis not present

## 2021-07-31 DIAGNOSIS — I130 Hypertensive heart and chronic kidney disease with heart failure and stage 1 through stage 4 chronic kidney disease, or unspecified chronic kidney disease: Secondary | ICD-10-CM | POA: Diagnosis not present

## 2021-07-31 DIAGNOSIS — R399 Unspecified symptoms and signs involving the genitourinary system: Secondary | ICD-10-CM | POA: Diagnosis not present

## 2021-07-31 DIAGNOSIS — J9611 Chronic respiratory failure with hypoxia: Secondary | ICD-10-CM | POA: Diagnosis not present

## 2021-07-31 DIAGNOSIS — M1990 Unspecified osteoarthritis, unspecified site: Secondary | ICD-10-CM | POA: Diagnosis not present

## 2021-07-31 DIAGNOSIS — N390 Urinary tract infection, site not specified: Secondary | ICD-10-CM | POA: Diagnosis not present

## 2021-07-31 DIAGNOSIS — N183 Chronic kidney disease, stage 3 unspecified: Secondary | ICD-10-CM | POA: Diagnosis not present

## 2021-07-31 DIAGNOSIS — D631 Anemia in chronic kidney disease: Secondary | ICD-10-CM | POA: Diagnosis not present

## 2021-07-31 DIAGNOSIS — I5033 Acute on chronic diastolic (congestive) heart failure: Secondary | ICD-10-CM | POA: Diagnosis not present

## 2021-07-31 DIAGNOSIS — I272 Pulmonary hypertension, unspecified: Secondary | ICD-10-CM | POA: Diagnosis not present

## 2021-07-31 DIAGNOSIS — J90 Pleural effusion, not elsewhere classified: Secondary | ICD-10-CM | POA: Diagnosis not present

## 2021-08-05 DIAGNOSIS — I272 Pulmonary hypertension, unspecified: Secondary | ICD-10-CM | POA: Diagnosis not present

## 2021-08-05 DIAGNOSIS — J9612 Chronic respiratory failure with hypercapnia: Secondary | ICD-10-CM | POA: Diagnosis not present

## 2021-08-05 DIAGNOSIS — J439 Emphysema, unspecified: Secondary | ICD-10-CM | POA: Diagnosis not present

## 2021-08-05 DIAGNOSIS — I5033 Acute on chronic diastolic (congestive) heart failure: Secondary | ICD-10-CM | POA: Diagnosis not present

## 2021-08-05 DIAGNOSIS — N183 Chronic kidney disease, stage 3 unspecified: Secondary | ICD-10-CM | POA: Diagnosis not present

## 2021-08-05 DIAGNOSIS — M1990 Unspecified osteoarthritis, unspecified site: Secondary | ICD-10-CM | POA: Diagnosis not present

## 2021-08-05 DIAGNOSIS — J9611 Chronic respiratory failure with hypoxia: Secondary | ICD-10-CM | POA: Diagnosis not present

## 2021-08-05 DIAGNOSIS — D631 Anemia in chronic kidney disease: Secondary | ICD-10-CM | POA: Diagnosis not present

## 2021-08-05 DIAGNOSIS — J90 Pleural effusion, not elsewhere classified: Secondary | ICD-10-CM | POA: Diagnosis not present

## 2021-08-05 DIAGNOSIS — I130 Hypertensive heart and chronic kidney disease with heart failure and stage 1 through stage 4 chronic kidney disease, or unspecified chronic kidney disease: Secondary | ICD-10-CM | POA: Diagnosis not present

## 2021-08-07 ENCOUNTER — Telehealth: Payer: Self-pay | Admitting: Family Medicine

## 2021-08-07 DIAGNOSIS — J449 Chronic obstructive pulmonary disease, unspecified: Secondary | ICD-10-CM | POA: Diagnosis not present

## 2021-08-07 DIAGNOSIS — M1990 Unspecified osteoarthritis, unspecified site: Secondary | ICD-10-CM | POA: Diagnosis not present

## 2021-08-07 DIAGNOSIS — N183 Chronic kidney disease, stage 3 unspecified: Secondary | ICD-10-CM | POA: Diagnosis not present

## 2021-08-07 DIAGNOSIS — D631 Anemia in chronic kidney disease: Secondary | ICD-10-CM | POA: Diagnosis not present

## 2021-08-07 DIAGNOSIS — J439 Emphysema, unspecified: Secondary | ICD-10-CM | POA: Diagnosis not present

## 2021-08-07 DIAGNOSIS — I130 Hypertensive heart and chronic kidney disease with heart failure and stage 1 through stage 4 chronic kidney disease, or unspecified chronic kidney disease: Secondary | ICD-10-CM | POA: Diagnosis not present

## 2021-08-07 DIAGNOSIS — J45909 Unspecified asthma, uncomplicated: Secondary | ICD-10-CM | POA: Diagnosis not present

## 2021-08-07 DIAGNOSIS — J90 Pleural effusion, not elsewhere classified: Secondary | ICD-10-CM | POA: Diagnosis not present

## 2021-08-07 DIAGNOSIS — R062 Wheezing: Secondary | ICD-10-CM | POA: Diagnosis not present

## 2021-08-07 DIAGNOSIS — J9612 Chronic respiratory failure with hypercapnia: Secondary | ICD-10-CM | POA: Diagnosis not present

## 2021-08-07 DIAGNOSIS — J9611 Chronic respiratory failure with hypoxia: Secondary | ICD-10-CM | POA: Diagnosis not present

## 2021-08-07 DIAGNOSIS — I272 Pulmonary hypertension, unspecified: Secondary | ICD-10-CM | POA: Diagnosis not present

## 2021-08-07 DIAGNOSIS — I5033 Acute on chronic diastolic (congestive) heart failure: Secondary | ICD-10-CM | POA: Diagnosis not present

## 2021-08-07 DIAGNOSIS — J9601 Acute respiratory failure with hypoxia: Secondary | ICD-10-CM | POA: Diagnosis not present

## 2021-08-07 NOTE — Telephone Encounter (Signed)
Merry Proud with  [facility] called stating that pt is still burning when urinating. Merry Proud is asking if Dr Diona Browner would want to call in a antibiotic. Please advise.

## 2021-08-08 NOTE — Telephone Encounter (Signed)
At last note urine culture was to be sent.. I have not seen results, have you?

## 2021-08-08 NOTE — Telephone Encounter (Signed)
It is in your office in box.  Mixed Urogenital Flora 10,000-25,000 colony forming units per mL.

## 2021-08-09 ENCOUNTER — Telehealth (HOSPITAL_COMMUNITY): Payer: Self-pay | Admitting: Pharmacy Technician

## 2021-08-09 NOTE — Telephone Encounter (Signed)
Linda Stevens at the number provided but the person answered said there was no one there by that name. I called [facility] and was transferred to speak to clinical manager-Amanda-she was in a metting and I left a detailed message of why I called and asked to have her call us back.  Please relay the message from Dr Diona Browner if she calls .

## 2021-08-09 NOTE — Telephone Encounter (Signed)
Advanced Heart Failure Patient Advocate Encounter  Patient's husband called and stated that he got a call that they needed to apply for a new grant for 2023 for Tadalafil and Ambrisentan but he wasn't sure where it came from. The patient currently has a [place]AF [place] grant that is set to expire 09/07/21.  Called TAF ([employer]) to confirm if the call came from them. I was told the patient has until 10/31 to get the application back to them for next year's coverage. I requested the company to mail the packet to the patient, I was told the patient would have to call in and request the information herself. I advised that the husband would be calling, he handles all her needs and should be on the account.  I called and relayed the above information to the patient's husband, [phone]. After he gets the application filled in, I offered to fax it in on their behalf. He will get back to me when the application is ready.

## 2021-08-09 NOTE — Telephone Encounter (Signed)
Please contact  home health.. if this was clean catch there is no infection. Just contaminated sample  No antibiotic needed.  If pt still concerned.. can recollect.

## 2021-08-09 NOTE — Telephone Encounter (Signed)
Estill Bamberg advised and she will let Merry Proud know

## 2021-08-12 ENCOUNTER — Encounter (HOSPITAL_COMMUNITY): Payer: Medicare HMO | Admitting: Internal Medicine

## 2021-08-13 ENCOUNTER — Ambulatory Visit (HOSPITAL_COMMUNITY)
Admission: RE | Admit: 2021-08-13 | Discharge: 2021-08-13 | Disposition: A | Discharge status: Home / Self Care | Payer: Medicare HMO | Source: Ambulatory Visit | Attending: Internal Medicine | Admitting: Internal Medicine

## 2021-08-13 ENCOUNTER — Encounter (HOSPITAL_COMMUNITY): Payer: Self-pay | Admitting: Internal Medicine

## 2021-08-13 ENCOUNTER — Ambulatory Visit (INDEPENDENT_AMBULATORY_CARE_PROVIDER_SITE_OTHER): Payer: Medicare HMO

## 2021-08-13 ENCOUNTER — Other Ambulatory Visit: Payer: Self-pay

## 2021-08-13 ENCOUNTER — Telehealth: Payer: Self-pay | Admitting: Internal Medicine

## 2021-08-13 VITALS — BP 104/52 | HR 87 | Wt 130.6 lb

## 2021-08-13 DIAGNOSIS — Z79899 Other long term (current) drug therapy: Secondary | ICD-10-CM | POA: Insufficient documentation

## 2021-08-13 DIAGNOSIS — J9610 Chronic respiratory failure, unspecified whether with hypoxia or hypercapnia: Secondary | ICD-10-CM | POA: Diagnosis not present

## 2021-08-13 DIAGNOSIS — Z09 Encounter for follow-up examination after completed treatment for conditions other than malignant neoplasm: Secondary | ICD-10-CM | POA: Insufficient documentation

## 2021-08-13 DIAGNOSIS — Z87891 Personal history of nicotine dependence: Secondary | ICD-10-CM | POA: Insufficient documentation

## 2021-08-13 DIAGNOSIS — J449 Chronic obstructive pulmonary disease, unspecified: Secondary | ICD-10-CM

## 2021-08-13 DIAGNOSIS — J90 Pleural effusion, not elsewhere classified: Secondary | ICD-10-CM | POA: Insufficient documentation

## 2021-08-13 DIAGNOSIS — I3139 Other pericardial effusion (noninflammatory): Secondary | ICD-10-CM | POA: Insufficient documentation

## 2021-08-13 DIAGNOSIS — L409 Psoriasis, unspecified: Secondary | ICD-10-CM | POA: Insufficient documentation

## 2021-08-13 DIAGNOSIS — K7460 Unspecified cirrhosis of liver: Secondary | ICD-10-CM | POA: Insufficient documentation

## 2021-08-13 DIAGNOSIS — J440 Chronic obstructive pulmonary disease with acute lower respiratory infection: Secondary | ICD-10-CM | POA: Insufficient documentation

## 2021-08-13 DIAGNOSIS — I272 Pulmonary hypertension, unspecified: Secondary | ICD-10-CM | POA: Insufficient documentation

## 2021-08-13 DIAGNOSIS — Z9981 Dependence on supplemental oxygen: Secondary | ICD-10-CM | POA: Diagnosis not present

## 2021-08-13 DIAGNOSIS — I5032 Chronic diastolic (congestive) heart failure: Secondary | ICD-10-CM | POA: Diagnosis not present

## 2021-08-13 DIAGNOSIS — I480 Paroxysmal atrial fibrillation: Secondary | ICD-10-CM | POA: Insufficient documentation

## 2021-08-13 DIAGNOSIS — I509 Heart failure, unspecified: Secondary | ICD-10-CM | POA: Diagnosis not present

## 2021-08-13 DIAGNOSIS — I110 Hypertensive heart disease with heart failure: Secondary | ICD-10-CM | POA: Diagnosis not present

## 2021-08-13 DIAGNOSIS — I10 Essential (primary) hypertension: Secondary | ICD-10-CM

## 2021-08-13 NOTE — Progress Notes (Signed)
ADVANCED HF CLINIC NOTE   PCP: Dr. Eliezer Lofts Pulmonary: Dr. Melvyn Novas HF Cardiology: Dr Haroldine Laws   HPI: Ms. Schriever is  75 y.o.woman with a h/o HTN, COPD (former smoker quit 2015), seasonal allergies, asthma, pulmonary HTN, and diastolic dysfxn.  Admitted 1/17 with progressive DOE, hypoxia, and lower ext edema and found to have severe PAH.  R/LHC 10/08/15 with normal coronaries, normal CO, and severe PAH PAP 104/49 with 13.6 WU.  PAH well out of proportion to left-sided filling pressures and COPD. Started revatio 20 TID with consideration for macitentan as outpatient. PFTs 10/05/15  FEV1 0.91 (40%) FVC 1.64 (55%), DLCO 8.80 (38%). Auto-immune and infectious serologies negative. VQ negative. Discharge weight 148 pounds.   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 Has been drained twice. Underwent first thoracentesis on 11/7.  Transudative. CT scan 07/24/17 showed large R effusion and small pericardial effusion. +COPD.  Underwent repeat thoracentesis on 07/29/17 with 310cc out.  Cytology negative. F/u CXR 11/21 showed small bilateral effusions.  RF markedly ++ but other serology negative. Saw Dr. Amil Amen who said she did not have RA but was diagnosed with polyarthralgia/OA.   Admitted 6/21 with respiratory failure. Echo EF 55-60% with mild RV HK with large peridardial effusion. Suspicion for auto-immune flare. ESR 63. Auto-immune panel again negative. Treated with steroids with prompt improvement and regression of pericardial effusion. Had PAF Went to SNF.  Readmitted 9/21 with Hgb  3.1. Underwent EGD and colonoscopy , concern for esophageal candidiasis, multiple polyps removed from the colon and colonic AVMs noted which is suspected to be the cause of ongoing bleeding in the setting of Eliquis. Eliquis stopped. Transfused a total of 4 units of PRBC this admission and given IV iron.  Admitted 8/22 with a/c respiratory failure with hypoxia  suspected to be due to combination of ? COPD exacerbation and a/c CHF.  She was started on IV lasix, DuoNebs and prednisone. AHF team consulted to assist with management.  Repeat echo showed EF 60-65%, RV ok. Hospitalization complicated by multifocal PNA and AKI w/ hyperkalemia. Arlyce Harman and diuretics were initially held but later resumed. She was discharged on 4L oxygen (baseline) with HH PT/OT, weight 141 lbs.  Today she returns for follow up with her husband and daughter. She still has HHPT coming every other week. Now able to do ADLs. Doesn't feel like she can go to the store or do more because doesn't do well with pulse O2 machine.    Studies:  Echo 05/04/21: EF 60-65%, RV okay Echo 4/21: LVEF 70-75% RV normal . No effusion. Personally reviewed Echo 4/19: EF 65% RV normal. No effusion Personally reviewed Echo 5/18:EF 60-65% RV completely normal. No TR to measure RVSP. No effusion  Echo 3/17: EF 60-65% septum still flattened. RV size improved moderate HK. RVSP ~80. IVC small. Pericardial effusion essentially resolved   CXR 09/28/17: Small bilateral effusions  PAH regimen: 1. Adcirca 40 2. Letairis 10 (Switched to [name] as it was thought that sinusitis might be due to macitentan.)  6MW 8/17: 840 ft (256 M) on 2 L O2, sats ranged 83-95%, HR ranged 96-111. 6MW 9/19: 780 ft =237.787mters On 3L of O2 Starting O2 94% Ending O2-85%  Starting heart rate-80 Ending heart rate- 110  RHC 12/18: RA = 5 RV = 56/9 PA = 52/17 (33) PCW = 10 Fick cardiac output/index = 7.5/4.5 PVR = 3.5 WU Ao sat = 96% PA sat = 73%, 72% High  SVC sat = 76%  VQ scan low prob. CT chest mild COPD. Moderate pericardial effusion. 3v CAD. + cirrhosis. Hepatitis serologies negative.   PFTs  (FEV1 1.23 (52%) with FVC 2.01 (65%) and DLCO 51% in 7/15) PFT's  10/08/2015  FEV1 0.91 (40 %) ratio 56% DLCO 38 % corrects to 63 % for alv volume  PFTs  12/06/15 FEV1 1.0 (44%) FVC 1.74 (58%0 DLCO 28%  R/LHC 10/08/15 with normal  coronaries, normal CO, and sever PAH with 13.6 WU.  Findings: Ao = 108/68 (86) LV = 102/10/11 RA = 9 RV = 97/8/12 PA = 104/49 (72) PCW = 18 Fick cardiac output/index = 3.96/2.34 PVR = 13.6 WU FA sat = 93% PA sat = 66%, 69%  RHC 5/18 RA = 2 RV = 48/4 PA = 45/17 (30) PCW = 8 Fick cardiac output/index = 4.9/3.0 PVR = 4.4 WU Ao sat = 94% PA sat = 68%, 70%  Review of systems complete and found to be negative unless listed in HPI.   SH:  Social History   Socioeconomic History   Marital status: Married    Spouse name: Not on file   Number of children: Not on file   Years of education: 2   Highest education level: Not on file  Occupational History   Occupation: retired Pharmacist, hospital  Tobacco Use   Smoking status: Former    Packs/day: 1.00    Years: 40.00    Pack years: 40.00    Types: Cigarettes    Quit date: 02/22/2014    Years since quitting: 7.4   Smokeless tobacco: Never  Vaping Use   Vaping Use: Never used  Substance and Sexual Activity   Alcohol use: No   Drug use: No   Sexual activity: Never  Other Topics Concern   Not on file  Social History Narrative   Married with 2 children.  Independent of ADLs.      Does not have a living will.   Would desire CPR but would not want prolonged life support if futile- husband aware.   Social Determinants of Health   Financial Resource Strain: Low Risk    Difficulty of Paying Living Expenses: Not very hard  Food Insecurity: No Food Insecurity   Worried About Charity fundraiser in the Last Year: Never true   Ran Out of Food in the Last Year: Never true  Transportation Needs: No Transportation Needs   Lack of Transportation (Medical): No   Lack of Transportation (Non-Medical): No  Physical Activity: Not on file  Stress: Not on file  Social Connections: Not on file  Intimate Partner Violence: Not on file   FH:  Family History  Problem Relation Age of Onset   Glaucoma Brother    Vascular Disease Father        Died  of complications from surgery   Heart attack Father    Asthma Mother        Died of asthma attack   Dementia Mother    Hyperlipidemia Mother    Hypertension Mother    Past Medical History:  Diagnosis Date   Acute respiratory failure with hypoxia and hypercapnea 02/26/2014   Allergic rhinitis due to pollen    Allergy    Anemia 06/12/2020   Arthritis    Asthma    CHF (congestive heart failure) (HCC)    COPD (chronic obstructive pulmonary disease) (HCC)    Diastolic dysfunction    a. 02/2014 Echo: EF 65-70%, Gr 1 DD, RVH;  b. 09/2015 Echo: EF 55%, Gr 1 DD.   Emphysema of lung (HCC)    H/O seasonal allergies    History of chicken pox    History of tobacco abuse    a. Quit 2015.   History of UTI    Hypertension    Hypertensive heart disease    Lower extremity edema    Pericardial effusion    a. 09/2015 Echo: Mod eff w/o tamponade.   Right heart failure with reduced right ventricular function ([place])    a. 09/2015 Echo: EF 55%, Gr 1 DD, D shaped IV septum, sev dil RV with mod reduced fxn, mod dil RA, RV-RA grad 57mHg, PASP 846mg, mod pericard eff w/o tamponade.   Severe Pulmonary Hypertension    a. 09/2015 Echo: PASP 8746m.   Current Outpatient Medications  Medication Sig Dispense Refill   acetaminophen (TYLENOL) 325 MG tablet Take 325 mg by mouth at bedtime as needed for moderate pain or headache.     acetaminophen (TYLENOL) 500 MG tablet Take 500 mg by mouth every 4 (four) hours as needed for headache (pain).     albuterol (VENTOLIN HFA) 108 (90 Base) MCG/ACT inhaler INHALE 2 PUFFS INTO THE LUNGS EVERY 6 HOURS AS NEEDED FOR WHEEZING/SHORTNESS OF BREATH 36 each 0   ambrisentan (LETAIRIS) 10 MG tablet Take 1 tablet (10 mg total) by mouth daily. 30 tablet 1   aspirin EC 81 MG EC tablet Take 1 tablet (81 mg total) by mouth daily. 30 tablet 0   ferrous sulfate 325 (65 FE) MG EC tablet TAKE 1 TABLET BY MOUTH TWICE A DAY WITH A MEAL 180 tablet 1   furosemide (LASIX) 40 MG tablet Patient  takes 1 tablet once daily unless she has swelling then she can take another tablet as needed.     guaiFENesin-dextromethorphan (ROBITUSSIN DM) 100-10 MG/5ML syrup Take 10 mLs by mouth every 6 (six) hours as needed for cough. 118 mL 1   hydrOXYzine (ATARAX/VISTARIL) 50 MG tablet TAKE 1/2 TO 1 TABLET BY MOUTH AT BEDTIME FOR ANXIETY 90 tablet 1   ketoconazole (NIZORAL) 2 % cream Apply 1 application topically daily. (Patient taking differently: Apply 1 application topically daily as needed.) 15 g 0   KLOR-CON M20 20 MEQ tablet TAKE 1 TABLET BY MOUTH EVERY DAY 90 tablet 1   OXYGEN Inhale 4 L into the lungs continuous.     pantoprazole (PROTONIX) 40 MG tablet TAKE 1 TABLET BY MOUTH EVERY DAY 90 tablet 1   polyvinyl alcohol (LIQUIFILM TEARS) 1.4 % ophthalmic solution Place 1 drop into both eyes daily as needed for dry eyes.     spironolactone (ALDACTONE) 25 MG tablet TAKE 1/2 TABLET BY MOUTH EVERY DAY 15 tablet 11   tadalafil (CIALIS) 20 MG tablet Take 2 tablets (40 mg total) by mouth daily. 180 tablet 3   No current facility-administered medications for this encounter.   BP (!) 104/52   Pulse 87   Wt 59.2 kg (130 lb 9.6 oz)   SpO2 100%   BMI 22.77 kg/m   Wt Readings from Last 3 Encounters:  08/13/21 59.2 kg (130 lb 9.6 oz)  05/10/21 62.7 kg (138 lb 4 oz)  05/08/21 64.3 kg (141 lb 12.1 oz)   PHYSICAL EXAM: General:  Sitting in WC on O2 No resp difficulty HEENT: normal + telengectacias Neck: supple. no JVD. Carotids 2+ bilat; no bruits. No lymphadenopathy or thryomegaly appreciated. Cor: PMI nondisplaced. Regular rate & rhythm. No rubs, gallops or murmurs. Lungs: decreased  throughout Abdomen: soft, nontender, nondistended. No hepatosplenomegaly. No bruits or masses. Good bowel sounds. Extremities: no cyanosis, clubbing, rash, edema + psoriatic path on LLE Neuro: alert & orientedx3, cranial nerves grossly intact. moves all 4 extremities w/o difficulty. Affect pleasant    ASSESSMENT &  PLAN:  1. Chronic diastolic HF with RV failure: - Repeat RHC 12/18 with mild PAH (mean PA 33) - Echo 5/18: with complete resolution of RV strain. No significant TR to estimate RVSP - Echo 4/19: with EF 65% no evidence of RV strain  - Echo 4/21: EF 70-75% RV normal  - Echo 11/21: EF 75% RV normal no evidence PAH - Echo 8/27: EF 60-65%, RV normal inadequate TR signal to assess PA pressure - Likely undiagnosed CTD though auto-immune panel previously negative. Does have psoriasis. Has seen Rheum  - Stable NYHA III, functional status limited by deconditioning. Stressed need to walk 10 min 2-3x/day and exercise with bands - Volume status ok  - Continue lasix 40 mg bid - Continue spiro 12.5 mg daily.   2. PAH - Suspect combination of WHO Group I and III PAH - On Letairis and Tadalafil.  - Previously discussed adding Selexipag but pressures down on last cath. RV now normal on echo  - repeat echo on next visit  3.  COPD-Gold III - On 4L O2 at home - On inhalers. - Follows with Pulmonary.  4. Chronic Respiratory failure - Multifactorial. Continue supplemental O2.  - Follows with Dr. Melvyn Novas.  - No change   5.  PAF: - Eliquis stopped due to severe LGIB 9/21 (hgb 3.1). Will not restart with AVMs. - Amio stopped previously - Remains in NSR   6. Recurrent R pleural effusion - Suspect possible component of hepatic hydrothorax. Now controlled with with diuretics   7.  Pericardial effusion: - resolved with steroid taper   8. Cirrhosis - Due to RHF. Hepatitis serologies negative.  - Given previous EF > 75%, may need to consider propanolol +/- midodrine to avoid high-output HF at some point.  9. Deconditioning - Continue PT/OT. - Discussed need to walk 2-3x per day for 10 mins. + exercise bands   [name], MD  10:25 PM

## 2021-08-13 NOTE — Telephone Encounter (Signed)
Linda Stevens states pt saw cardiologist and was suggested to call MW. Pt has POC but her lungs are not responding to the pulsating even at level 4. She likes continuous air, and that only goes to level 2. Wonders if they could get a POC that has continous air to level 4/5. Please advise.

## 2021-08-13 NOTE — Patient Instructions (Signed)
Medication Changes:  No change  Lab Work:  none  Testing/Procedures:  none  Referrals:  none  Special Instructions // Education:  none  Follow-Up in: 6 months (June 2023) with Echocardiogram ** Call in May 2023 for appointment**   At the [facility], you and your health needs are our priority. We have a designated team specialized in the treatment of Heart Failure. This Care Team includes your primary Heart Failure Specialized Cardiologist (physician), Advanced Practice Providers (APPs- Physician Assistants and Nurse Practitioners), and Pharmacist who all work together to provide you with the care you need, when you need it.   You may see any of the following providers on your designated Care Team at your next follow up:  Dr [name] Dr Haynes Kerns, NP Lyda Jester, [place] [facility], [place] Audry Riles, PharmD   Please be sure to bring in all your medications bottles to every appointment.   Need to Contact us:  If you have any questions or concerns before your next appointment please send [place] a message through [place] or call our office at [phone].    TO LEAVE A MESSAGE FOR THE NURSE SELECT OPTION 2, PLEASE LEAVE A MESSAGE INCLUDING: YOUR NAME DATE OF BIRTH CALL BACK NUMBER REASON FOR CALL**this is important as we prioritize the call backs  YOU WILL RECEIVE A CALL BACK THE SAME DAY AS LONG AS YOU CALL BEFORE 4:00 PM

## 2021-08-13 NOTE — Chronic Care Management (AMB) (Signed)
  Care Management   Follow Up Note   08/13/2021 Name: Linda Stevens MRN: [id number] DOB: Mar 04, 1946   Referred by: [name], MD Reason for referral : Chronic Care Management (Initial assessment)   Successful contact was made with the patient to discuss care management and care coordination services. Patient declines engagement at this time.   Follow Up Plan: The patient has been provided with contact information for the care management team and has been advised to call with any health related questions or concerns.   Quinn Plowman RN,BSN,CCM RN Case Manager [place]  [phone]

## 2021-08-13 NOTE — Patient Instructions (Signed)
Visit Information  Thank you for allowing me to share the care management and care coordination services that are available to you as part of your health plan and services through your primary care provider and medical home. Please reach out to me at [phone]  if the care management/care coordination team may be of assistance to you in the future.   Quinn Plowman RN,BSN,CCM RN Case Manager [place]  [phone]

## 2021-08-15 NOTE — Telephone Encounter (Signed)
Called and spoke to pt's daughter, [name] (on [place]). Pt currently on POC. She states the pt is wanting a POC that goes higher than 2lpm continuous. [facility] that type of system isnt available. I also advised [name] that pt is needing an appt (last seen 02/2020) and she can have walk test to see how much O2 she needs and then assure she has a O2 system that delivers that. [name] states she will talk with the pt and call back for the appt.     Pt needs OV with NP or MW.

## 2021-08-19 ENCOUNTER — Other Ambulatory Visit: Payer: Self-pay

## 2021-08-19 ENCOUNTER — Telehealth: Payer: Self-pay | Admitting: Nurse Practitioner

## 2021-08-19 ENCOUNTER — Encounter: Payer: Self-pay | Admitting: Nurse Practitioner

## 2021-08-19 ENCOUNTER — Ambulatory Visit: Payer: Medicare HMO | Admitting: Nurse Practitioner

## 2021-08-19 ENCOUNTER — Ambulatory Visit (INDEPENDENT_AMBULATORY_CARE_PROVIDER_SITE_OTHER): Payer: Medicare HMO

## 2021-08-19 VITALS — BP 110/58 | Temp 98.7°F | Ht 62.0 in | Wt 132.6 lb

## 2021-08-19 DIAGNOSIS — I5032 Chronic diastolic (congestive) heart failure: Secondary | ICD-10-CM

## 2021-08-19 DIAGNOSIS — J441 Chronic obstructive pulmonary disease with (acute) exacerbation: Secondary | ICD-10-CM | POA: Diagnosis not present

## 2021-08-19 DIAGNOSIS — J90 Pleural effusion, not elsewhere classified: Secondary | ICD-10-CM

## 2021-08-19 DIAGNOSIS — R0602 Shortness of breath: Secondary | ICD-10-CM | POA: Diagnosis not present

## 2021-08-19 DIAGNOSIS — K219 Gastro-esophageal reflux disease without esophagitis: Secondary | ICD-10-CM

## 2021-08-19 DIAGNOSIS — R059 Cough, unspecified: Secondary | ICD-10-CM | POA: Diagnosis not present

## 2021-08-19 DIAGNOSIS — J301 Allergic rhinitis due to pollen: Secondary | ICD-10-CM | POA: Diagnosis not present

## 2021-08-19 DIAGNOSIS — J9611 Chronic respiratory failure with hypoxia: Secondary | ICD-10-CM | POA: Diagnosis not present

## 2021-08-19 DIAGNOSIS — I2721 Secondary pulmonary arterial hypertension: Secondary | ICD-10-CM

## 2021-08-19 DIAGNOSIS — J449 Chronic obstructive pulmonary disease, unspecified: Secondary | ICD-10-CM | POA: Diagnosis not present

## 2021-08-19 DIAGNOSIS — J9811 Atelectasis: Secondary | ICD-10-CM | POA: Diagnosis not present

## 2021-08-19 MED ORDER — PREDNISONE 10 MG PO TABS: ORAL_TABLET | ORAL | 0 refills | Status: DC

## 2021-08-19 MED ORDER — BREZTRI AEROSPHERE 160-9-4.8 MCG/ACT IN AERO: 2.0000 | INHALATION_SPRAY | Freq: Two times a day (BID) | RESPIRATORY_TRACT | 0 refills | Status: DC

## 2021-08-19 MED ORDER — LORATADINE 10 MG PO TABS: 10.0000 mg | ORAL_TABLET | Freq: Every day | ORAL | 3 refills | Status: DC

## 2021-08-19 MED ORDER — PANTOPRAZOLE SODIUM 40 MG PO TBEC: 40.0000 mg | DELAYED_RELEASE_TABLET | Freq: Two times a day (BID) | ORAL | 1 refills | Status: DC

## 2021-08-19 MED ORDER — FLUTICASONE PROPIONATE 50 MCG/ACT NA SUSP: 1.0000 | Freq: Every day | NASAL | 2 refills | Status: AC

## 2021-08-19 MED ORDER — BREZTRI AEROSPHERE 160-9-4.8 MCG/ACT IN AERO: 2.0000 | INHALATION_SPRAY | Freq: Two times a day (BID) | RESPIRATORY_TRACT | 3 refills | Status: DC

## 2021-08-19 NOTE — Assessment & Plan Note (Signed)
Unchanged on CXR today. CT chest for further evaluation.

## 2021-08-19 NOTE — Assessment & Plan Note (Signed)
Initiated daily regimen. See above plan.

## 2021-08-19 NOTE — Assessment & Plan Note (Signed)
No longer able to tolerate POC; needs continuous flow O2 at 4lpm. Order to DME sent for portable O2 tanks. Continue supplemental O2 at 4-6lpm for goal SpO2 >88-90%. See above plan.

## 2021-08-19 NOTE — Patient Instructions (Addendum)
-  Start Breztri inhaler 2 puffs Twice daily for maintenance therapy, brush tongue and rinse mouth afterwards -Continue Albuterol inhaler 2 puffs every 6 hours as needed for shortness of breath or wheezing. Notify if symptoms persist despite rescue inhaler use.   -Continue supplemental O2 at 4-6L/min. Continue to use home concentrator when at home. Change from POC to continuous flow portable tanks when out of the house. Goal oxygen saturation >88-90%.  -Increase Protonix 40 mg to Twice daily  -Flonase nasal spray 1-2 sprays each nostril daily for nasal congestion/runny nose/allergy symptoms -Claritin 10 mg daily -Prednisone taper pack. 4 tabs for 2 days, then 3 tabs for 2 days, 2 tabs for 2 days, then 1 tab for 2 days, then stop. Take in the AM with food  -Saline nasal sprays 2-3 times a day -Mucinex (guaifenesin) 600 mg twice daily   Schedule pulmonary function testing  Chest x ray today. We will notify you of results.   Walking oximetry today with SpO2 low 97% on 4L.  Notify if worsening breathlessness, cough, mucus production, fatigue, or wheezing occurs.  Maintain up to date vaccinations, including influenza, COVID, and pneumococcal.  Wash your hands often and avoid sick exposures.  Encouraged masking in crowds.  Avoid triggers, when possible.  Exercise, as tolerated. Notify if worsening symptoms upon exertion occur.    Follow up with cardiology/heart failure specialist as scheduled.   Follow up with Dr. Melvyn Novas, Roxan Diesel, NP or APP after PFTs. If symptoms do not improve or worsen, please contact office for sooner follow up or seek emergency care.

## 2021-08-19 NOTE — Telephone Encounter (Signed)
Called and spoke with patient's daughter [name]. She stated that she was returning a call from [place].   Joellen Jersey, can you please advise? She stated that it was ok for you to leave a detailed message.  Thanks!

## 2021-08-19 NOTE — Progress Notes (Signed)
_0  ID: Linda Stevens, female    DOB: [birth year], 75 y.o.   MRN: [id number]  Chief Complaint  Patient presents with   Follow-up    Follow up. Reports her breathing has gotten better. Still has cough with minimal mucous.     Referring provider: [name], MD  HPI: 75 year old female, former smoker (40 pack years) followed for COPD Gold 2 and chronic respiratory failure. She is a patient of Dr. Gustavus Stevens and last seen in office 02/13/2020.  Past medical history significant for pulmonary hypertension, A. fib with RVR, CHF, hypertension, allergic rhinitis, hepatic cirrhosis, hypothyroidism.  She is followed by cardiology and palliative care.  TEST/EVENTS:  12/06/2015 PFTs: FVC 1.89 (63), FEV1 1.17 (52), ratio 62, RV 3.74 (178), TLC 5.54 (113), DLCO uncorrected 6.59 (28).  Moderately severe obstruction with mild BD reversibility.  Severe diffusion defect 07/13/2020 echocardiogram: LVEF greater than 75%.  LV hyperdynamic function.  Mild LVH.  GradeII DD.  Tricuspid regurgitation signal was inadequate for assessing PA pressure.  RV function and size normal.  No valvular dysfunction noted. 05/03/2021 CXR 1 view: Ill-defined opacities and areas of interstitial prominence throughout the mid to lower lungs bilaterally, most severe in the lung bases, likely reflect areas of subsegmental atelectasis however underlying airspace consolidation is difficult to entirely exclude.  Small bilateral pleural effusions (right greater than left).  Mild cephalization of the pulmonary vasculature.  No pneumothorax.  mild cardiomegaly.  Appearance of the chest suggest CHF 05/03/2021 CT chest without contrast: Atherosclerosis.  Gross enlargement of main PA measuring up to 4 cm.  Small pericardial effusion.  Unchanged prominent mediastinal and hilar lymph nodes.  Moderate centrilobular emphysema.  Numerous scattered, ill-defined groundglass and heterogeneous airspace opacities throughout the lungs, most concentrated in  the left upper lobe.  Unchanged small, loculated bilateral pleural effusions associated atelectasis or consolidation.  Coarse nodular contour of the liver, suggestive of cirrhosis.  05/04/2021 echocardiogram: LVEF 60 to 65%.  LV diastolic parameters indeterminate.  Tricuspid regurgitation signal was inadequate for assessing PA pressure.  No valvular dysfunction noted.  IVC dilated in size with greater than 50% respiratory variability, suggesting right atrial pressure of 60mHg  02/13/2020: OV with Dr. WMelvyn Stevens  Increase shortness of breath and increased fatigue.  Utilizing 3 L/min with increased to 4-5 upon exercise.  Prednisone taper back.  Continued on Trelegy.  Azithromycin rx.  Follow-up 6 months.  Utilizing POC for supplemental O2 therapy.  08/13/2021: Telephone encounter.  Patient reported doing better with continuous air vs pulsating level and requesting POC that goes higher than 2lpm continuous. Notified there is not a POC system available that offers this. OV visit made to complete walk test and determine appropriate O2 therapy.   08/19/2021: Today - follow up Patient presents today with her husband and her youngest daughter, Linda Stevens for follow up after she was recommended to see [facility] her HF physician. She has been on a POC for when she leaves the house but has been unable to tolerate the pulsating therapy since she now requires a continuous flow of 4lpm. Unfortunately, this has resulted in her being relatively home bound. She has to use a wheelchair when out due to limitations in her activity caused by her breathing. She also has been experiencing daily symptoms of shortness of breath, upon exertion and at rest, which has been progressively worsening over the last year. She has a chronic productive daily cough with clear sputum. She also reports clear rhinorrhea and occasional nasal congestion, that  flares with season change. She denies any recent fevers, chills or sick exposures. She denies wheezing, PND,  orthopnea, chest pain or worsening lower extremity swelling. She stopped her Trelegy inhaler due to it making her cough worse over a year ago and has not been on a maintenance inhaler since. Her husband reports that she uses her rescue inhaler multiple times a day. She is followed by cardiology (last seen 08/13/2021) for CHF and pulmonary hypertension and is treated with Letairis, lasix, spironolactone, and Cialis. Overall, she feels ok but reports her breathing as worse.   Allergies  Allergen Reactions   Amoxicillin-Pot Clavulanate Other (See Comments)    REACTION: gi upset Has patient had a PCN reaction causing immediate rash, facial/tongue/throat swelling, SOB or lightheadedness with hypotension: No Has patient had a PCN reaction causing severe rash involving mucus membranes or skin necrosis: No Has patient had a PCN reaction that required hospitalization : No Has patient had a PCN reaction occurring within the last 10 years: No If all of the above answers are "NO", then may proceed with Cephalosporin use.    Cefdinir Other (See Comments)    REACTION: gi  upset   Singulair [Montelukast Sodium] Itching        Moxifloxacin Rash    Immunization History  Administered Date(s) Administered   Fluad Quad(high Dose 65+) 06/19/2020   Influenza,inj,Quad PF,6+ Mos 05/03/2014, 08/21/2015, 07/08/2016, 06/05/2017, 07/19/2018, 07/14/2019   PFIZER(Purple Top)SARS-COV-2 Vaccination 10/15/2019, 11/07/2019   Pneumococcal Conjugate-13 01/17/2016   Pneumococcal Polysaccharide-23 04/19/2014   Td 07/23/2018    Past Medical History:  Diagnosis Date   Acute respiratory failure with hypoxia and hypercapnea 02/26/2014   Allergic rhinitis due to pollen    Allergy    Anemia 06/12/2020   Arthritis    Asthma    CHF (congestive heart failure) (HCC)    COPD (chronic obstructive pulmonary disease) ([place])    Diastolic dysfunction    a. 02/2014 Echo: EF 65-70%, Gr 1 DD, RVH;  b. 09/2015 Echo: EF 55%, Gr 1 DD.    Emphysema of lung (HCC)    H/O seasonal allergies    History of chicken pox    History of tobacco abuse    a. Quit 2015.   History of UTI    Hypertension    Hypertensive heart disease    Lower extremity edema    Pericardial effusion    a. 09/2015 Echo: Mod eff w/o tamponade.   Right heart failure with reduced right ventricular function ([place])    a. 09/2015 Echo: EF 55%, Gr 1 DD, D shaped IV septum, sev dil RV with mod reduced fxn, mod dil RA, RV-RA grad 25mHg, PASP 87mg, mod pericard eff w/o tamponade.   Severe Pulmonary Hypertension    a. 09/2015 Echo: PASP 8768m.    Tobacco History: Social History   Tobacco Use  Smoking Status Former   Packs/day: 1.00   Years: 40.00   Pack years: 40.00   Types: Cigarettes   Quit date: 02/22/2014   Years since quitting: 7.4  Smokeless Tobacco Never   Counseling given: Not Answered   Outpatient Medications Prior to Visit  Medication Sig Dispense Refill   acetaminophen (TYLENOL) 325 MG tablet Take 325 mg by mouth at bedtime as needed for moderate pain or headache.     acetaminophen (TYLENOL) 500 MG tablet Take 500 mg by mouth every 4 (four) hours as needed for headache (pain).     albuterol (VENTOLIN HFA) 108 (90 Base) MCG/ACT inhaler INHALE 2 PUFFS  INTO THE LUNGS EVERY 6 HOURS AS NEEDED FOR WHEEZING/SHORTNESS OF BREATH 36 each 0   ambrisentan (LETAIRIS) 10 MG tablet Take 1 tablet (10 mg total) by mouth daily. 30 tablet 1   aspirin EC 81 MG EC tablet Take 1 tablet (81 mg total) by mouth daily. 30 tablet 0   ferrous sulfate 325 (65 FE) MG EC tablet TAKE 1 TABLET BY MOUTH TWICE A DAY WITH A MEAL 180 tablet 1   furosemide (LASIX) 40 MG tablet Patient takes 1 tablet once daily unless she has swelling then she can take another tablet as needed.     guaiFENesin-dextromethorphan (ROBITUSSIN DM) 100-10 MG/5ML syrup Take 10 mLs by mouth every 6 (six) hours as needed for cough. 118 mL 1   hydrOXYzine (ATARAX/VISTARIL) 50 MG tablet TAKE 1/2 TO 1 TABLET  BY MOUTH AT BEDTIME FOR ANXIETY 90 tablet 1   ketoconazole (NIZORAL) 2 % cream Apply 1 application topically daily. (Patient taking differently: Apply 1 application topically daily as needed.) 15 g 0   KLOR-CON M20 20 MEQ tablet TAKE 1 TABLET BY MOUTH EVERY DAY 90 tablet 1   OXYGEN Inhale 4 L into the lungs continuous.     polyvinyl alcohol (LIQUIFILM TEARS) 1.4 % ophthalmic solution Place 1 drop into both eyes daily as needed for dry eyes.     spironolactone (ALDACTONE) 25 MG tablet TAKE 1/2 TABLET BY MOUTH EVERY DAY 15 tablet 11   tadalafil (CIALIS) 20 MG tablet Take 2 tablets (40 mg total) by mouth daily. 180 tablet 3   pantoprazole (PROTONIX) 40 MG tablet TAKE 1 TABLET BY MOUTH EVERY DAY 90 tablet 1   No facility-administered medications prior to visit.     Review of Systems:   Constitutional: No weight loss or gain, night sweats, fevers, chills. +fatigue HEENT: No headaches, difficulty swallowing, tooth/dental problems, or sore throat. No sneezing, itching, ear ache. +Nasal congestion, clear rhinorrhea, postnasal drip CV:  No chest pain, orthopnea, PND, swelling in lower extremities, anasarca, dizziness, palpitations, syncope Resp: +shortness of breath with exertion and occasionally at rest; chronic productive cough with clear sputum production (worse on Trelegy inhaler); 4L continuous supplemental O2, unable to tolerate POC. No hemoptysis. No wheezing.  No chest wall deformity GI:  +occasional feelings of fullness, belching after meals. No abdominal pain, nausea, vomiting, diarrhea, change in bowel habits, loss of appetite, bloody stools.  GU: No dysuria, change in color of urine, urgency or frequency.  No flank pain, no hematuria  Skin: No rash, lesions, ulcerations MSK:  No joint pain or swelling.  No decreased range of motion.  No back pain. Neuro: No dizziness or lightheadedness.  Psych: No depression or anxiety. Mood stable.     Physical Exam:  BP (!) 110/58 (BP Location:  Left Arm, Patient Position: Sitting, Cuff Size: Normal)   Temp 98.7 F (37.1 C) (Oral)   Ht _0  (1.575 m)   Wt 132 lb 9.6 oz (60.1 kg)   SpO2 93%   BMI 24.25 kg/m   GEN: Pleasant, interactive, chronically-ill appearing; in no acute distress. HEENT:  Normocephalic and atraumatic. EACs patent bilaterally. TM pearly gray with present light reflex bilaterally. PERRLA. Sclera white. Nasal turbinates pink, moist and patent bilaterally. Clear rhinorrhea present. Oropharynx erythematous and moist, without exudate or edema. No lesions, ulcerations. NECK:  Supple w/ fair ROM. No JVD present. Normal carotid impulses w/o bruits. Thyroid symmetrical with no goiter or nodules palpated. No lymphadenopathy.   CV: RRR, no m/r/g, no peripheral edema.  Pulses intact, +2 bilaterally. No cyanosis, pallor or clubbing. PULMONARY:  Unlabored, regular breathing. Diminished breath sounds bilaterally A&P w/o wheezes/rales/rhonchi. No accessory muscle use. No dullness to percussion. GI: BS present and normoactive. Soft, non-tender to palpation. No organomegaly or masses detected. No CVA tenderness. MSK: No erythema, warmth or tenderness. Cap refil <2 sec all extrem. No deformities or joint swelling noted.  Neuro: A/Ox3. No focal deficits noted.   Skin: Warm, no lesions or rashe Psych: Normal affect and behavior. Judgement and thought content appropriate.     Lab Results:  CBC    Component Value Date/Time   WBC 5.7 05/16/2021 1552   RBC 3.13 (L) 05/16/2021 1552   HGB 9.8 (L) 05/16/2021 1552   HCT 32.4 (L) 05/16/2021 1552   PLT 146 (L) 05/16/2021 1552   MCV 103.5 (H) 05/16/2021 1552   MCH 31.3 05/16/2021 1552   MCHC 30.2 05/16/2021 1552   RDW 20.3 (H) 05/16/2021 1552   LYMPHSABS 0.8 05/03/2021 1008   MONOABS 0.5 05/03/2021 1008   EOSABS 0.1 05/03/2021 1008   BASOSABS 0.0 05/03/2021 1008    BMET    Component Value Date/Time   NA 141 05/16/2021 1552   K 4.6 05/16/2021 1552   CL 98 05/16/2021 1552    CO2 37 (H) 05/16/2021 1552   GLUCOSE 124 (H) 05/16/2021 1552   BUN 21 05/16/2021 1552   CREATININE 1.61 (H) 05/16/2021 1552   CREATININE 1.27 (H) 03/23/2020 1524   CALCIUM 8.2 (L) 05/16/2021 1552   GFRNONAA 34 (L) 05/16/2021 1552   GFRAA 48 (L) 06/09/2020 0202    BNP    Component Value Date/Time   BNP 358.4 (H) 05/03/2021 1105     Imaging:  08/19/2021: CXR reviewed by me. Atherosclerosis. Cardiomegaly. Persistent small right and trace left pleural effusions, unchanged. Streaky left basilar opacity improved from prior study. Diffuse interstitial prominence with mild improvement.   DG Chest 2 View  Result Date: 08/19/2021 CLINICAL DATA:  Worsening shortness of breath and cough. History of COPD. EXAM: CHEST - 2 VIEW COMPARISON:  Chest radiograph and CT 05/03/2021 FINDINGS: The cardiac silhouette remains enlarged. Aortic atherosclerosis is noted. There are persistent small right and trace left pleural effusions with chronic atelectasis or consolidation in the right lung base. Streaky left basilar opacity has improved from the prior study. Diffuse interstitial prominence has also mildly improved. No overt pulmonary edema or pneumothorax is identified. Right upper quadrant abdominal surgical clips are noted. No acute osseous abnormality is seen. IMPRESSION: 1. Unchanged small right and trace left pleural effusions with chronic right basilar atelectasis or consolidation. 2. Streaky left basilar opacity, improved from prior and favoring atelectasis. Electronically Signed   By: Logan Bores M.D.   On: 08/19/2021 15:22      PFT Results Latest Ref Rng & Units 12/06/2015 10/05/2015 04/06/2014  FVC-Pre L 1.74 1.64 2.01  FVC-Predicted Pre % 58 55 65  FVC-Post L 1.89 1.60 2.17  FVC-Predicted Post % 63 54 70  Pre FEV1/FVC % % 58 56 61  Post FEV1/FCV % % 62 49 63  FEV1-Pre L 1.00 0.91 1.23  FEV1-Predicted Pre % 44 40 52  FEV1-Post L 1.17 0.79 1.36  DLCO uncorrected ml/min/mmHg 6.59 8.80 12.25   DLCO UNC% % 28 38 51  DLVA Predicted % 44 63 71  TLC L 5.54 4.32 4.39  TLC % Predicted % 113 88 88  RV % Predicted % 178 137 114    No results found for: NITRICOXIDE   Walking  oximetry today SpO2 low 97% on 4lpm. Limited by breathlessness. Completed 250 ft.    Assessment & Plan:   COPD GOLD II criteria but 02 dep 24/7  High symptom burden with increasing O2 requirements. Has been off maintenance therapy for approx 1 year. Utilizing rescue inhaler daily. Discussed importance of maintenance therapy. Started Breztri Twice daily as trelegy (DPI) worsened cough. Continued albuterol PRN. Allergic rhinitis maintenance therapy initiated. Optimized PPI therapy. Prednisone taper. CXR today significant for unchanged bilateral pleural effusions; improving streaky left basilar opacity compared to prev exam. CT chest wo contrast for further evaluation. PFTs. Follow by HF clinic for CHF and pulm HTN.   Patient Instructions  -Start Breztri inhaler 2 puffs Twice daily for maintenance therapy, brush tongue and rinse mouth afterwards -Continue Albuterol inhaler 2 puffs every 6 hours as needed for shortness of breath or wheezing. Notify if symptoms persist despite rescue inhaler use.   -Continue supplemental O2 at 4-6L/min. Continue to use home concentrator when at home. Change from POC to continuous flow portable tanks when out of the house. Goal oxygen saturation >88-90%.  -Increase Protonix 40 mg to Twice daily  -Flonase nasal spray 1-2 sprays each nostril daily for nasal congestion/runny nose/allergy symptoms -Claritin 10 mg daily -Prednisone taper pack. 4 tabs for 2 days, then 3 tabs for 2 days, 2 tabs for 2 days, then 1 tab for 2 days, then stop. Take in the AM with food  -Saline nasal sprays 2-3 times a day -Mucinex (guaifenesin) 600 mg twice daily   Schedule pulmonary function testing  Chest x ray today. We will notify you of results.   Walking oximetry today with SpO2 low 97% on  4L.  Notify if worsening breathlessness, cough, mucus production, fatigue, or wheezing occurs.  Maintain up to date vaccinations, including influenza, COVID, and pneumococcal.  Wash your hands often and avoid sick exposures.  Encouraged masking in crowds.  Avoid triggers, when possible.  Exercise, as tolerated. Notify if worsening symptoms upon exertion occur.    Follow up with cardiology/heart failure specialist as scheduled.   Follow up with Dr. Melvyn Stevens, Roxan Diesel, NP or APP after PFTs. If symptoms do not improve or worsen, please contact office for sooner follow up or seek emergency care.    Chronic respiratory failure with hypoxia (HCC) No longer able to tolerate POC; needs continuous flow O2 at 4lpm. Order to DME sent for portable O2 tanks. Continue supplemental O2 at 4-6lpm for goal SpO2 >88-90%. See above plan.  Allergic rhinitis Initiated daily regimen. See above plan.  Chronic diastolic CHF (congestive heart failure) ([place]) Follow up with cardiology/HR as ordered. See above plan.  Pulmonary artery hypertension (HCC) Continue Letairis, lasix, spironolactone, and Cialis as ordered by Dr. Haroldine Laws. See above plan.  Pleural effusion, bilateral Unchanged on CXR today. CT chest for further evaluation.      Clayton Bibles, NP 08/19/2021  Pt aware and understands NP's role.

## 2021-08-19 NOTE — Assessment & Plan Note (Signed)
High symptom burden with increasing O2 requirements. Has been off maintenance therapy for approx 1 year. Utilizing rescue inhaler daily. Discussed importance of maintenance therapy. Started Breztri Twice daily as trelegy (DPI) worsened cough. Continued albuterol PRN. Allergic rhinitis maintenance therapy initiated. Optimized PPI therapy. Prednisone taper. CXR today significant for unchanged bilateral pleural effusions; improving streaky left basilar opacity compared to prev exam. CT chest wo contrast for further evaluation. PFTs. Follow by HF clinic for CHF and pulm HTN.   Patient Instructions  -Start Breztri inhaler 2 puffs Twice daily for maintenance therapy, brush tongue and rinse mouth afterwards -Continue Albuterol inhaler 2 puffs every 6 hours as needed for shortness of breath or wheezing. Notify if symptoms persist despite rescue inhaler use.   -Continue supplemental O2 at 4-6L/min. Continue to use home concentrator when at home. Change from POC to continuous flow portable tanks when out of the house. Goal oxygen saturation >88-90%.  -Increase Protonix 40 mg to Twice daily  -Flonase nasal spray 1-2 sprays each nostril daily for nasal congestion/runny nose/allergy symptoms -Claritin 10 mg daily -Prednisone taper pack. 4 tabs for 2 days, then 3 tabs for 2 days, 2 tabs for 2 days, then 1 tab for 2 days, then stop. Take in the AM with food  -Saline nasal sprays 2-3 times a day -Mucinex (guaifenesin) 600 mg twice daily   Schedule pulmonary function testing  Chest x ray today. We will notify you of results.   Walking oximetry today with SpO2 low 97% on 4L.  Notify if worsening breathlessness, cough, mucus production, fatigue, or wheezing occurs.  Maintain up to date vaccinations, including influenza, COVID, and pneumococcal.  Wash your hands often and avoid sick exposures.  Encouraged masking in crowds.  Avoid triggers, when possible.  Exercise, as tolerated. Notify if worsening symptoms  upon exertion occur.    Follow up with cardiology/heart failure specialist as scheduled.   Follow up with Dr. Melvyn Novas, Roxan Diesel, NP or APP after PFTs. If symptoms do not improve or worsen, please contact office for sooner follow up or seek emergency care.

## 2021-08-19 NOTE — Assessment & Plan Note (Signed)
Follow up with cardiology/HR as ordered. See above plan.

## 2021-08-19 NOTE — Telephone Encounter (Signed)
I spoke with patient's daughter, with patient's approval, and discussed CXR findings. Persistent small right and trace pleural effusions, unchanged from previous exam; improving left basilar streaky opacity and improving diffuse interstitial prominence. CT chest ordered for further evaluation. Daughter verbalized understanding and had no further questions. Thanks!

## 2021-08-19 NOTE — Assessment & Plan Note (Signed)
Continue Letairis, lasix, spironolactone, and Cialis as ordered by Dr. Haroldine Laws. See above plan.

## 2021-08-20 ENCOUNTER — Telehealth: Payer: Medicare HMO

## 2021-08-21 DIAGNOSIS — I130 Hypertensive heart and chronic kidney disease with heart failure and stage 1 through stage 4 chronic kidney disease, or unspecified chronic kidney disease: Secondary | ICD-10-CM | POA: Diagnosis not present

## 2021-08-21 DIAGNOSIS — N183 Chronic kidney disease, stage 3 unspecified: Secondary | ICD-10-CM | POA: Diagnosis not present

## 2021-08-21 DIAGNOSIS — J9612 Chronic respiratory failure with hypercapnia: Secondary | ICD-10-CM | POA: Diagnosis not present

## 2021-08-21 DIAGNOSIS — D631 Anemia in chronic kidney disease: Secondary | ICD-10-CM | POA: Diagnosis not present

## 2021-08-21 DIAGNOSIS — J439 Emphysema, unspecified: Secondary | ICD-10-CM | POA: Diagnosis not present

## 2021-08-21 DIAGNOSIS — I272 Pulmonary hypertension, unspecified: Secondary | ICD-10-CM | POA: Diagnosis not present

## 2021-08-21 DIAGNOSIS — J9611 Chronic respiratory failure with hypoxia: Secondary | ICD-10-CM | POA: Diagnosis not present

## 2021-08-21 DIAGNOSIS — M1990 Unspecified osteoarthritis, unspecified site: Secondary | ICD-10-CM | POA: Diagnosis not present

## 2021-08-21 DIAGNOSIS — J90 Pleural effusion, not elsewhere classified: Secondary | ICD-10-CM | POA: Diagnosis not present

## 2021-08-21 DIAGNOSIS — I5033 Acute on chronic diastolic (congestive) heart failure: Secondary | ICD-10-CM | POA: Diagnosis not present

## 2021-08-23 ENCOUNTER — Other Ambulatory Visit: Payer: Self-pay | Admitting: Family Medicine

## 2021-08-23 DIAGNOSIS — K219 Gastro-esophageal reflux disease without esophagitis: Secondary | ICD-10-CM

## 2021-08-26 ENCOUNTER — Telehealth: Payer: Self-pay | Admitting: Nurse Practitioner

## 2021-08-27 MED ORDER — AEROCHAMBER MV MISC
0 refills | Status: DC
Start: 2021-08-27 — End: 2021-12-02

## 2021-08-27 NOTE — Telephone Encounter (Signed)
Call made to patient spouse, confirmed patient DOB. Patient husband is very upset and irritated with [employer]. Per husband at last appt a order should have been sent in for POC at [facility]. Per the husband they went to the location advised and they refused to do the appt stating she has been on oxygen more than 30 days so the insurance will not pay for it.   Community message sent to [employer] with [employer]. Will update message once we have a response.

## 2021-08-27 NOTE — Telephone Encounter (Signed)
Pt had OV with [facility] 12/12. Closing encounter.

## 2021-08-27 NOTE — Telephone Encounter (Signed)
Called and spoke with Delfino Lovett about the info from [place]. Per [name], pt does not have a spacer but he is willing to give this a try to see if it would help with pt's cough. Rx has been sent to pt's preferred pharmacy. Nothing further needed.

## 2021-08-27 NOTE — Telephone Encounter (Signed)
Is she using a spacer with the Breztri? If not, let's send order for spacer to the pharmacy and advise her to trial Breztri with the spacer as it is likely upper airway irritation. Thanks!

## 2021-08-27 NOTE — Telephone Encounter (Signed)
Called and spoke with pt's spouse Delfino Lovett who stated the Breztri made pt cough very bad just like the Trelegy did. He stated that he had to give her cough meds to help with pt's cough.  [name] stated that pt did stop taking the Breztri due to pt's cough being just as bad with the [facility] as when she was taking the Trelegy.  Routing to [employer] for recommendations. Please advise.

## 2021-08-28 NOTE — Telephone Encounter (Addendum)
Checked Brittany's community msgs. [employer] responded stating they would look into this. Will continue to await response.

## 2021-09-01 DIAGNOSIS — J449 Chronic obstructive pulmonary disease, unspecified: Secondary | ICD-10-CM | POA: Diagnosis not present

## 2021-09-03 ENCOUNTER — Telehealth: Payer: Self-pay | Admitting: Nurse Practitioner

## 2021-09-03 DIAGNOSIS — I130 Hypertensive heart and chronic kidney disease with heart failure and stage 1 through stage 4 chronic kidney disease, or unspecified chronic kidney disease: Secondary | ICD-10-CM | POA: Diagnosis not present

## 2021-09-03 DIAGNOSIS — I5033 Acute on chronic diastolic (congestive) heart failure: Secondary | ICD-10-CM | POA: Diagnosis not present

## 2021-09-03 DIAGNOSIS — J439 Emphysema, unspecified: Secondary | ICD-10-CM | POA: Diagnosis not present

## 2021-09-03 DIAGNOSIS — M1990 Unspecified osteoarthritis, unspecified site: Secondary | ICD-10-CM | POA: Diagnosis not present

## 2021-09-03 DIAGNOSIS — J90 Pleural effusion, not elsewhere classified: Secondary | ICD-10-CM | POA: Diagnosis not present

## 2021-09-03 DIAGNOSIS — D631 Anemia in chronic kidney disease: Secondary | ICD-10-CM | POA: Diagnosis not present

## 2021-09-03 DIAGNOSIS — N183 Chronic kidney disease, stage 3 unspecified: Secondary | ICD-10-CM | POA: Diagnosis not present

## 2021-09-03 DIAGNOSIS — J9611 Chronic respiratory failure with hypoxia: Secondary | ICD-10-CM | POA: Diagnosis not present

## 2021-09-03 DIAGNOSIS — I272 Pulmonary hypertension, unspecified: Secondary | ICD-10-CM | POA: Diagnosis not present

## 2021-09-03 DIAGNOSIS — J9612 Chronic respiratory failure with hypercapnia: Secondary | ICD-10-CM | POA: Diagnosis not present

## 2021-09-03 NOTE — Telephone Encounter (Signed)
Called and spoke with Delfino Lovett, patient's husband (DPR) regarding oxygen issue.  He states he received a call from [place] telling them about the visit, that it would last from 1pm-2pm and that it would take an hour and not to be late.  They drove 45 minutes to get there and when they arrived, they acted like they knew nothing about it.  They went to get evaluated for continuous oxygen from pulsed oxygen.  Advised I would call our contact and see what I could find out.  He verbalized understanding.  Called and spoke with [facility] with [employer] regarding above situation.  He states there may be an issue with past due balance and had a hard time contacting them in the past and [employer] may have had an incorrect address on file.  Ler[facility] will do more investigating, however, since they have made a payment and they do not have a large balance, it is not likely that the oxygen will be picked up.  Leroy Sea will contact Danielle and have her f/u.  Called and spoke with Mr. Allington, advised him that [employer] will be continuing the investigating and will f/u with them directly.  Nothing further needed.

## 2021-09-07 DIAGNOSIS — I10 Essential (primary) hypertension: Secondary | ICD-10-CM

## 2021-09-07 DIAGNOSIS — J9601 Acute respiratory failure with hypoxia: Secondary | ICD-10-CM | POA: Diagnosis not present

## 2021-09-07 DIAGNOSIS — J449 Chronic obstructive pulmonary disease, unspecified: Secondary | ICD-10-CM | POA: Diagnosis not present

## 2021-09-07 DIAGNOSIS — I509 Heart failure, unspecified: Secondary | ICD-10-CM

## 2021-09-07 DIAGNOSIS — J45909 Unspecified asthma, uncomplicated: Secondary | ICD-10-CM | POA: Diagnosis not present

## 2021-09-07 DIAGNOSIS — R062 Wheezing: Secondary | ICD-10-CM | POA: Diagnosis not present

## 2021-09-10 ENCOUNTER — Other Ambulatory Visit: Payer: Self-pay

## 2021-09-10 ENCOUNTER — Telehealth: Payer: Self-pay | Admitting: Nurse Practitioner

## 2021-09-10 ENCOUNTER — Other Ambulatory Visit (HOSPITAL_COMMUNITY): Payer: Self-pay

## 2021-09-10 ENCOUNTER — Ambulatory Visit (INDEPENDENT_AMBULATORY_CARE_PROVIDER_SITE_OTHER)
Admission: RE | Admit: 2021-09-10 | Discharge: 2021-09-10 | Disposition: A | Discharge status: Home / Self Care | Payer: Medicare HMO | Source: Ambulatory Visit | Attending: Nurse Practitioner | Admitting: Nurse Practitioner

## 2021-09-10 ENCOUNTER — Telehealth (HOSPITAL_COMMUNITY): Payer: Self-pay | Admitting: Pharmacist

## 2021-09-10 DIAGNOSIS — J441 Chronic obstructive pulmonary disease with (acute) exacerbation: Secondary | ICD-10-CM | POA: Diagnosis not present

## 2021-09-10 DIAGNOSIS — J9611 Chronic respiratory failure with hypoxia: Secondary | ICD-10-CM | POA: Diagnosis not present

## 2021-09-10 DIAGNOSIS — J439 Emphysema, unspecified: Secondary | ICD-10-CM | POA: Diagnosis not present

## 2021-09-10 DIAGNOSIS — J9690 Respiratory failure, unspecified, unspecified whether with hypoxia or hypercapnia: Secondary | ICD-10-CM | POA: Diagnosis not present

## 2021-09-10 DIAGNOSIS — J90 Pleural effusion, not elsewhere classified: Secondary | ICD-10-CM | POA: Diagnosis not present

## 2021-09-10 DIAGNOSIS — J449 Chronic obstructive pulmonary disease, unspecified: Secondary | ICD-10-CM | POA: Diagnosis not present

## 2021-09-10 NOTE — Telephone Encounter (Signed)
Advanced Heart Failure Patient Advocate Encounter  Prior Authorization for Ambrisentan has been approved.     Effective dates: 09/08/21 through 09/07/22  Audry Riles, PharmD, BCPS, BCCP, CPP Heart Failure Clinic Pharmacist [phone]

## 2021-09-10 NOTE — Telephone Encounter (Signed)
Called pt and there was no answer, no option to leave msg.

## 2021-09-10 NOTE — Telephone Encounter (Signed)
If pt's O2 sats are 77-78% and especially if this is with pt wearing her O2, pt needs to seek emergent care.  Attempted to call pt's spouse Delfino Lovett but unable to reach and unable to leave VM due to VM not being set up yet. Will try to call back later.

## 2021-09-10 NOTE — Telephone Encounter (Signed)
Patient Advocate Encounter   Received notification from [employer] that prior authorization for Ambrisentan is required.   PA submitted on CoverMyMeds Key [id number] Status is pending   Will continue to follow.   Of note, attempted to also submit PA for tadalafil and received the following message: The patient currently has access to the requested medication and a Prior Authorization is not needed for the patient/medication.   Audry Riles, PharmD, BCPS, BCCP, CPP Heart Failure Clinic Pharmacist [phone]

## 2021-09-11 ENCOUNTER — Telehealth: Payer: Self-pay | Admitting: Nurse Practitioner

## 2021-09-11 NOTE — Progress Notes (Signed)
See telephone encounter. Copied you as well! Thanks!

## 2021-09-11 NOTE — Telephone Encounter (Signed)
Called and spoke with patient's daughter [name] per [place] and she states that patient is fine and they have everything figured out. She states that has a new machine and that the patient was only getting air and not oxygen but that the daughter went over there and they got it figured out. Nothing further needed at this time.

## 2021-09-11 NOTE — Telephone Encounter (Signed)
Lm for patient's daughter, Linda Stevens.

## 2021-09-11 NOTE — Progress Notes (Signed)
Please notify patient that her CT chest scan showed some improvement in her lungs when compared to her previous chest x ray. She has chronic, unchanged emphysema and mild scarring. She was supposed to have PFTs and a follow up after, which were never scheduled. Can we please ensure these get scheduled? Thanks!

## 2021-09-11 NOTE — Progress Notes (Signed)
Also, there is a telephone encounter from yesterday stating that she was having desaturations to 77-78%. Please reach out to husband or daughter to determine how patient is doing and if this has resolved. Thanks!

## 2021-09-11 NOTE — Telephone Encounter (Signed)
No new concerns regarding findings in her lungs. If she is experiencing back pain, we can refer her to orthospine or neurosurgery. Endplate fractures are usually treated conservatively (pain control and activity modifications). Thanks.

## 2021-09-11 NOTE — Telephone Encounter (Signed)
ATC patient's home #, no answer, no VM. ATC patient's daughter, Linda Stevens [facility]), Left vm to return call.  Will await return call.

## 2021-09-11 NOTE — Telephone Encounter (Signed)
Spoke to patient's daughter, Linda Stevens(DPR). She reviewed CT via mychart and she has concerns regarding the impression copied below. She wants to know if anything can be done about it?  Newly seen superior endplate fractures at P59 and T12 with loss of height of only 10%. Exact age is indeterminate. This could be acute or simply new since August of last year. These were not present last August. The degree healing cannot be established by this CT.  Belenda Cruise please advise. Thanks

## 2021-09-11 NOTE — Progress Notes (Signed)
ATC x1, no answer and no VM.

## 2021-09-11 NOTE — Telephone Encounter (Signed)
Linda Stevens daughter is returning phone call. Linda Stevens phone number is [phone].

## 2021-09-12 NOTE — Telephone Encounter (Signed)
Spoke to patient's daughter, Wendy(DPR) is aware of recommendations and voiced her understanding.  [name] stated that patient is not experiencing any back pain.  She will call back for referral if patient develops back pain.  Nothing further needed at this time.

## 2021-10-08 DIAGNOSIS — J449 Chronic obstructive pulmonary disease, unspecified: Secondary | ICD-10-CM | POA: Diagnosis not present

## 2021-10-08 DIAGNOSIS — R062 Wheezing: Secondary | ICD-10-CM | POA: Diagnosis not present

## 2021-10-08 DIAGNOSIS — J9601 Acute respiratory failure with hypoxia: Secondary | ICD-10-CM | POA: Diagnosis not present

## 2021-10-08 DIAGNOSIS — J45909 Unspecified asthma, uncomplicated: Secondary | ICD-10-CM | POA: Diagnosis not present

## 2021-10-11 ENCOUNTER — Other Ambulatory Visit (HOSPITAL_COMMUNITY): Payer: Self-pay | Admitting: Internal Medicine

## 2021-10-12 ENCOUNTER — Other Ambulatory Visit: Payer: Self-pay | Admitting: Nurse Practitioner

## 2021-10-12 ENCOUNTER — Other Ambulatory Visit: Payer: Self-pay | Admitting: Family Medicine

## 2021-10-12 DIAGNOSIS — J301 Allergic rhinitis due to pollen: Secondary | ICD-10-CM

## 2021-10-12 DIAGNOSIS — K219 Gastro-esophageal reflux disease without esophagitis: Secondary | ICD-10-CM

## 2021-10-13 NOTE — Telephone Encounter (Signed)
Last office visit 9/202/22 for hospital follow up.  Last refilled hydroxyzine 06/04/21 for #90 with 1 refill.  Ketoconazole cream 12/14/20 for 15 g with no refills.

## 2021-11-01 ENCOUNTER — Telehealth: Payer: Self-pay | Admitting: Family Medicine

## 2021-11-01 NOTE — Progress Notes (Signed)
Chronic Care Management   Note  11/01/2021 Name: Linda Stevens MRN: [id number] DOB: [birth year]  Linda Stevens is a 76 y.o. year old female who is a primary care patient of Bedsole, Amy E, MD. I reached out to Linda Stevens by phone today in response to a referral sent by Ms. Driscilla Moats [place]'s PCP, [name], MD.   Ms. Anastos was given information about Chronic Care Management services today including:  CCM service includes personalized support from designated clinical staff supervised by her physician, including individualized plan of care and coordination with other care providers 24/7 contact phone numbers for assistance for urgent and routine care needs. Service will only be billed when office clinical staff spend 20 minutes or more in a month to coordinate care. Only one practitioner may furnish and bill the service in a calendar month. The patient may stop CCM services at any time (effective at the end of the month) by phone call to the office staff.   [name] Mayeux/SPOUSE verbally agreed to assistance and services provided by embedded care coordination/care management team today.  Follow up plan:   Tatjana Secretary/administrator

## 2021-11-05 DIAGNOSIS — J9601 Acute respiratory failure with hypoxia: Secondary | ICD-10-CM | POA: Diagnosis not present

## 2021-11-05 DIAGNOSIS — R062 Wheezing: Secondary | ICD-10-CM | POA: Diagnosis not present

## 2021-11-05 DIAGNOSIS — J45909 Unspecified asthma, uncomplicated: Secondary | ICD-10-CM | POA: Diagnosis not present

## 2021-11-05 DIAGNOSIS — J449 Chronic obstructive pulmonary disease, unspecified: Secondary | ICD-10-CM | POA: Diagnosis not present

## 2021-11-11 NOTE — Progress Notes (Unsigned)
Subjective:   Linda Stevens is a 76 y.o. female who presents for Medicare Annual (Subsequent) preventive examination.  I connected with Kaley Jutras today by telephone and verified that I am speaking with the correct person using two identifiers. Location patient: home Location provider: work Persons participating in the virtual visit: patient, Marine scientist.    I discussed the limitations, risks, security and privacy concerns of performing an evaluation and management service by telephone and the availability of in person appointments. I also discussed with the patient that there may be a patient responsible charge related to this service. The patient expressed understanding and verbally consented to this telephonic visit.    Interactive audio and video telecommunications were attempted between this provider and patient, however failed, due to patient having technical difficulties OR patient did not have access to video capability.  We continued and completed visit with audio only.  Some vital signs may be absent or patient reported.   Time Spent with patient on telephone encounter: *** minutes  Review of Systems           Objective:    There were no vitals filed for this visit. There is no height or weight on file to calculate BMI.  Advanced Directives 05/03/2021 05/03/2021 05/03/2021 06/07/2020 06/06/2020 02/20/2020 02/19/2020  Does Patient Have a Medical Advance Directive? _0  Yes -  Type of Paramedic of [place];Living will Living will;Healthcare Power of [place];Living will [place];Living will [place];Living will [place];Living will -  Does patient want to make changes to medical advance directive? No - Patient declined No - Patient declined - - No - Patient declined No - Patient declined No - Patient declined  Copy of [place]  in Chart? No - copy requested - - - Yes - validated most recent copy scanned in chart (See row information) Yes - validated most recent copy scanned in chart (See row information) -  Would patient like information on creating a medical advance directive? - - - - - - -  Pre-existing out of facility DNR order (yellow form or pink MOST form) - - - - - - -    Current Medications (verified) Outpatient Encounter Medications as of 11/12/2021  Medication Sig   acetaminophen (TYLENOL) 325 MG tablet Take 325 mg by mouth at bedtime as needed for moderate pain or headache.   acetaminophen (TYLENOL) 500 MG tablet Take 500 mg by mouth every 4 (four) hours as needed for headache (pain).   albuterol (VENTOLIN HFA) 108 (90 Base) MCG/ACT inhaler INHALE 2 PUFFS INTO THE LUNGS EVERY 6 HOURS AS NEEDED FOR WHEEZING/SHORTNESS OF BREATH   ambrisentan (LETAIRIS) 10 MG tablet TAKE 1 TABLET BY MOUTH 1 TIME A DAY. DO NOT HANDLE IF PREGNANT.   aspirin EC 81 MG EC tablet Take 1 tablet (81 mg total) by mouth daily.   Budeson-Glycopyrrol-Formoterol (BREZTRI AEROSPHERE) 160-9-4.8 MCG/ACT AERO Inhale 2 puffs into the lungs in the morning and at bedtime. Brush tongue and rinse mouth afterwards.   Budeson-Glycopyrrol-Formoterol (BREZTRI AEROSPHERE) 160-9-4.8 MCG/ACT AERO Inhale 2 puffs into the lungs in the morning and at bedtime.   ferrous sulfate 325 (65 FE) MG EC tablet TAKE 1 TABLET BY MOUTH TWICE A DAY WITH A MEAL   fluticasone (FLONASE) 50 MCG/ACT nasal spray Place 1 spray into both nostrils daily.   furosemide (LASIX) 40 MG tablet Patient takes 1 tablet once  daily unless she has swelling then she can take another tablet as needed.   guaiFENesin-dextromethorphan (ROBITUSSIN DM) 100-10 MG/5ML syrup Take 10 mLs by mouth every 6 (six) hours as needed for cough.   hydrOXYzine (ATARAX) 50 MG tablet TAKE 1/2 TO 1 TABLET BY MOUTH AT BEDTIME FOR ANXIETY   ketoconazole (NIZORAL) 2 % cream APPLY 1 APPLICATION TOPICALLY DAILY   KLOR-CON  M20 20 MEQ tablet TAKE 1 TABLET BY MOUTH EVERY DAY   loratadine (CLARITIN) 10 MG tablet TAKE 1 TABLET BY MOUTH EVERY DAY   OXYGEN Inhale 4 L into the lungs continuous.   pantoprazole (PROTONIX) 40 MG tablet TAKE 1 TABLET BY MOUTH TWICE A DAY   polyvinyl alcohol (LIQUIFILM TEARS) 1.4 % ophthalmic solution Place 1 drop into both eyes daily as needed for dry eyes.   predniSONE (DELTASONE) 10 MG tablet Take 4 tabs by mouth for 2 days, then 3 tabs for 2 days, 2 tabs for 2 days, then 1 tab for 2 days, then stop. Take in the AM with food.   Spacer/Aero-Holding Chambers (AEROCHAMBER MV) inhaler Use as instructed   spironolactone (ALDACTONE) 25 MG tablet TAKE 1/2 TABLET BY MOUTH EVERY DAY   tadalafil (CIALIS) 20 MG tablet Take 2 tablets (40 mg total) by mouth daily.   No facility-administered encounter medications on file as of 11/12/2021.    Allergies (verified) Amoxicillin-pot clavulanate, Cefdinir, Singulair [montelukast sodium], and Moxifloxacin   History: Past Medical History:  Diagnosis Date   Acute respiratory failure with hypoxia and hypercapnea 02/26/2014   Allergic rhinitis due to pollen    Allergy    Anemia 06/12/2020   Arthritis    Asthma    CHF (congestive heart failure) (HCC)    COPD (chronic obstructive pulmonary disease) ([place])    Diastolic dysfunction    a. 02/2014 Echo: EF 65-70%, Gr 1 DD, RVH;  b. 09/2015 Echo: EF 55%, Gr 1 DD.   Emphysema of lung (HCC)    H/O seasonal allergies    History of chicken pox    History of tobacco abuse    a. Quit 2015.   History of UTI    Hypertension    Hypertensive heart disease    Lower extremity edema    Pericardial effusion    a. 09/2015 Echo: Mod eff w/o tamponade.   Right heart failure with reduced right ventricular function ([place])    a. 09/2015 Echo: EF 55%, Gr 1 DD, D shaped IV septum, sev dil RV with mod reduced fxn, mod dil RA, RV-RA grad 80mHg, PASP 835mg, mod pericard eff w/o tamponade.   Severe Pulmonary Hypertension    a.  09/2015 Echo: PASP 8745m.   Past Surgical History:  Procedure Laterality Date   CARDIAC CATHETERIZATION N/A 10/08/2015   Procedure: Right/Left Heart Cath and Coronary Angiography;  Surgeon: DanJolaine ArtistD;  Location: [facility];  Service: Cardiovascular;  Laterality: N/A;   CHOLECYSTECTOMY     Open   COLONOSCOPY WITH PROPOFOL N/A 06/07/2020   Procedure: COLONOSCOPY WITH PROPOFOL;  Surgeon: PyrJerene BearsD;  [facility];  Laterality: N/A;   ESOPHAGOGASTRODUODENOSCOPY (EGD) WITH PROPOFOL N/A 06/07/2020   Procedure: ESOPHAGOGASTRODUODENOSCOPY (EGD) WITH PROPOFOL;  Surgeon: PyrJerene BearsD;  [facility];  Service: Gastroenterology;  Laterality: N/A;   POLYPECTOMY  06/07/2020   Procedure: POLYPECTOMY;  Surgeon: PyrJerene BearsD;  [facility];;   RIGHT HEART CATH N/A 01/26/2017   Procedure: Right  Heart Cath;  Surgeon: Jolaine Artist, MD;  Location: [facility];  Service: Cardiovascular;  Laterality: N/A;   RIGHT HEART CATH N/A 08/12/2017   Procedure: RIGHT HEART CATH;  Surgeon: Jolaine Artist, MD;  Location: [facility];  Service: Cardiovascular;  Laterality: N/A;   Family History  Problem Relation Age of Onset   Glaucoma Brother    Vascular Disease Father        Died of complications from surgery   Heart attack Father    Asthma Mother        Died of asthma attack   Dementia Mother    Hyperlipidemia Mother    Hypertension Mother    Social History   Socioeconomic History   Marital status: Married    Spouse name: Not on file   Number of children: Not on file   Years of education: 2   Highest education level: Not on file  Occupational History   Occupation: retired Pharmacist, hospital  Tobacco Use   Smoking status: Former    Packs/day: 1.00    Years: 40.00    Pack years: 40.00    Types: Cigarettes    Quit date: 02/22/2014    Years since quitting: 7.7   Smokeless  tobacco: Never  Vaping Use   Vaping Use: Never used  Substance and Sexual Activity   Alcohol use: No   Drug use: No   Sexual activity: Never  Other Topics Concern   Not on file  Social History Narrative   Married with 2 children.  Independent of ADLs.      Does not have a living will.   Would desire CPR but would not want prolonged life support if futile- husband aware.   Social Determinants of Health   Financial Resource Strain: Low Risk    Difficulty of Paying Living Expenses: Not very hard  Food Insecurity: No Food Insecurity   Worried About Charity fundraiser in the Last Year: Never true   Ran Out of Food in the Last Year: Never true  Transportation Needs: No Transportation Needs   Lack of Transportation (Medical): No   Lack of Transportation (Non-Medical): No  Physical Activity: Not on file  Stress: Not on file  Social Connections: Not on file    Tobacco Counseling Counseling given: Not Answered   Clinical Intake:                 Diabetic? No         Activities of Daily Living In your present state of health, do you have any difficulty performing the following activities: 05/03/2021  Hearing? N  Vision? N  Difficulty concentrating or making decisions? N  Walking or climbing stairs? Y  Dressing or bathing? N  Doing errands, shopping? N  Some recent data might be hidden    Patient Care Team: [name], MD as PCP - General (Family Medicine) Bensimhon, Shaune Pascal, MD as PCP - Cardiology (Cardiology) Tanda Rockers, MD as Consulting Physician (Pulmonary Disease) Rutherford Guys, MD as Consulting Physician (Ophthalmology) Charlton Haws, [facility] as Pharmacist (Pharmacist)  Indicate any recent Medical Services you may have received from other than [facility] providers in the past year (date may be approximate).     Assessment:   This is a routine wellness examination for Julieanna.  Hearing/Vision screen No results found.  Dietary issues and  exercise activities discussed:     Goals Addressed   None    Depression Screen PHQ  2/9 Scores 07/19/2018  PHQ - 2 Score 0  PHQ- 9 Score 0    Fall Risk Fall Risk  07/19/2018  Falls in the past year? 0    FALL RISK PREVENTION PERTAINING TO THE HOME:  Any stairs in or around the home? {YES/NO:21197} If so, are there any without handrails? {YES/NO:21197} Home free of loose throw rugs in walkways, pet beds, electrical cords, etc? {YES/NO:21197} Adequate lighting in your home to reduce risk of falls? {YES/NO:21197}  ASSISTIVE DEVICES UTILIZED TO PREVENT FALLS:  Life alert? {YES/NO:21197} Use of a cane, walker or w/c? {YES/NO:21197} Grab bars in the bathroom? {YES/NO:21197} Shower chair or bench in shower? {YES/NO:21197} Elevated toilet seat or a handicapped toilet? {YES/NO:21197}  TIMED UP AND GO:  Was the test performed? No .  Length of time to ambulate 10 feet: *** sec.   {Appearance of Gait:2101803}  Cognitive Function: MMSE - Mini Mental State Exam 07/19/2018  Orientation to time 5  Orientation to Place 5  Registration 3  Attention/ Calculation 0  Recall 3  Language- name 2 objects 0  Language- repeat 1  Language- follow 3 step command 3  Language- read & follow direction 0  Write a sentence 0  Copy design 0  Total score 20        Immunizations Immunization History  Administered Date(s) Administered   Fluad Quad(high Dose 65+) 06/19/2020   Influenza,inj,Quad PF,6+ Mos 05/03/2014, 08/21/2015, 07/08/2016, 06/05/2017, 07/19/2018, 07/14/2019   PFIZER(Purple Top)SARS-COV-2 Vaccination 10/15/2019, 11/07/2019   Pneumococcal Conjugate-13 01/17/2016   Pneumococcal Polysaccharide-23 04/19/2014   Td 07/23/2018    TDAP status: Up to date  {Flu Vaccine status:2101806}  Pneumococcal vaccine status: Up to date  {Covid-19 vaccine status:2101808}  Qualifies for Shingles Vaccine? Yes   Zostavax completed No   {Shingrix Completed?:2101804}  Screening  Tests Health Maintenance  Topic Date Due   Zoster Vaccines- Shingrix (1 of 2) Never done   MAMMOGRAM  05/09/2017   COVID-19 Vaccine (3 - Booster for Pfizer series) 01/02/2020   INFLUENZA VACCINE  04/08/2021   COLON CANCER SCREENING ANNUAL FOBT  06/07/2021   TETANUS/TDAP  07/23/2028   COLONOSCOPY (Pts 45-57yr Insurance coverage will need to be confirmed)  06/07/2030   Pneumonia Vaccine 76 Years old  Completed   DEXA SCAN  Completed   Hepatitis C Screening  Completed   HPV VACCINES  Aged Out    Health Maintenance  Health Maintenance Due  Topic Date Due   Zoster Vaccines- Shingrix (1 of 2) Never done   MAMMOGRAM  05/09/2017   COVID-19 Vaccine (3 - Booster for Pfizer series) 01/02/2020   INFLUENZA VACCINE  04/08/2021   COLON CANCER SCREENING ANNUAL FOBT  06/07/2021    Colorectal cancer screening: Type of screening: Colonoscopy. Completed 06/07/20. Repeat every 10 years  {Mammogram status:21018020}  {Bone Density status:21018021}  Lung Cancer Screening: (Low Dose CT Chest recommended if Age 74-80years, 30 pack-year currently smoking OR have quit w/in 15years.) does qualify. Chest CT completed 09/10/21    Additional Screening:  Hepatitis C Screening: does qualify; Completed 10/15/15  Vision Screening: Recommended annual ophthalmology exams for early detection of glaucoma and other disorders of the eye. Is the patient up to date with their annual eye exam?  {YES/NO:21197} Who is the provider or what is the name of the office in which the patient attends annual eye exams? *** If pt is not established with a provider, would they like to be referred to a provider to establish care? {YES/NO:21197}.   Dental  Screening: Recommended annual dental exams for proper oral hygiene  Community Resource Referral / Chronic Care Management: CRR required this visit?  {YES/NO:21197}  CCM required this visit?  {YES/NO:21197}     Plan:     I have personally reviewed and noted the  following in the patients chart:   Medical and social history Use of alcohol, tobacco or illicit drugs  Current medications and supplements including opioid prescriptions.  Functional ability and status Nutritional status Physical activity Advanced directives List of other physicians Hospitalizations, surgeries, and ER visits in previous 12 months Vitals Screenings to include cognitive, depression, and falls Referrals and appointments  In addition, I have reviewed and discussed with patient certain preventive protocols, quality metrics, and best practice recommendations. A written personalized care plan for preventive services as well as general preventive health recommendations were provided to patient.   Due to this being a telephonic visit, the after visit summary with patients personalized plan was offered to patient via mail or my-chart. ***Patient declined at this time./ Patient would like to access on my-chart/ per request, patient was mailed a copy of AVS./ Patient preferred to pick up at office at next visit.   Loma Messing, LPN   09/08/209   Nurse Health Advisor  Nurse Notes: none

## 2021-11-12 ENCOUNTER — Ambulatory Visit: Payer: Medicare HMO

## 2021-11-21 ENCOUNTER — Other Ambulatory Visit: Payer: Self-pay | Admitting: Family Medicine

## 2021-11-27 ENCOUNTER — Other Ambulatory Visit (HOSPITAL_COMMUNITY): Payer: Self-pay | Admitting: Internal Medicine

## 2021-11-27 ENCOUNTER — Telehealth: Payer: Self-pay

## 2021-11-27 NOTE — Progress Notes (Signed)
? ? ?Chronic Care Management ?Pharmacy Assistant  ? ?Name: Linda Stevens  MRN: [id number] DOB: March 13, 1946 ? ?Reason for Encounter: CCM (Initial Questions) ?  ?Recent office visits:  ?07/08/2021 Linda Stevens Obstructive Pulmonary Disease No other info ? ?Recent consult visits:  ?08/19/2021 Linda Stevens Kitchen, NP (Pulmonology): COPD Start: [name]. Start: Flonase 50 MCG Start: Claritin 10 mg Start: Prednisone 10 mg. Increase: Protonix 40 mg BID Start: Spacer with Breztri ?08/13/2021 [name], MD (Cardiology): CHF No med changes. ? ?Hospital visits:  ?None in previous 6 months ? ?Medications: ?Outpatient Encounter Medications as of 11/27/2021  ?Medication Sig  ? acetaminophen (TYLENOL) 325 MG tablet Take 325 mg by mouth at bedtime as needed for moderate pain or headache.  ? acetaminophen (TYLENOL) 500 MG tablet Take 500 mg by mouth every 4 (four) hours as needed for headache (pain).  ? albuterol (VENTOLIN HFA) 108 (90 Base) MCG/ACT inhaler INHALE 2 PUFFS INTO THE LUNGS EVERY 6 HOURS AS NEEDED FOR WHEEZING/SHORTNESS OF BREATH  ? ambrisentan (LETAIRIS) 10 MG tablet TAKE 1 TABLET BY MOUTH 1 TIME A DAY. DO NOT HANDLE IF PREGNANT.  ? aspirin EC 81 MG EC tablet Take 1 tablet (81 mg total) by mouth daily.  ? Budeson-Glycopyrrol-Formoterol (BREZTRI AEROSPHERE) 160-9-4.8 MCG/ACT AERO Inhale 2 puffs into the lungs in the morning and at bedtime. Brush tongue and rinse mouth afterwards.  ? Budeson-Glycopyrrol-Formoterol (BREZTRI AEROSPHERE) 160-9-4.8 MCG/ACT AERO Inhale 2 puffs into the lungs in the morning and at bedtime.  ? ferrous sulfate 325 (65 FE) MG EC tablet TAKE 1 TABLET BY MOUTH TWICE A DAY WITH A MEAL  ? fluticasone (FLONASE) 50 MCG/ACT nasal spray Place 1 spray into both nostrils daily.  ? furosemide (LASIX) 40 MG tablet Patient takes 1 tablet once daily unless she has swelling then she can take another tablet as needed.  ? guaiFENesin-dextromethorphan (ROBITUSSIN DM) 100-10 MG/5ML syrup Take 10 mLs by mouth  every 6 (six) hours as needed for cough.  ? hydrOXYzine (ATARAX) 50 MG tablet TAKE 1/2 TO 1 TABLET BY MOUTH AT BEDTIME FOR ANXIETY  ? ketoconazole (NIZORAL) 2 % cream APPLY 1 APPLICATION TOPICALLY DAILY  ? KLOR-CON M20 20 MEQ tablet TAKE 1 TABLET BY MOUTH EVERY DAY  ? loratadine (CLARITIN) 10 MG tablet TAKE 1 TABLET BY MOUTH EVERY DAY  ? OXYGEN Inhale 4 L into the lungs continuous.  ? pantoprazole (PROTONIX) 40 MG tablet TAKE 1 TABLET BY MOUTH TWICE A DAY  ? polyvinyl alcohol (LIQUIFILM TEARS) 1.4 % ophthalmic solution Place 1 drop into both eyes daily as needed for dry eyes.  ? predniSONE (DELTASONE) 10 MG tablet Take 4 tabs by mouth for 2 days, then 3 tabs for 2 days, 2 tabs for 2 days, then 1 tab for 2 days, then stop. Take in the AM with food.  ? Spacer/Aero-Holding Chambers (AEROCHAMBER MV) inhaler Use as instructed  ? spironolactone (ALDACTONE) 25 MG tablet TAKE 1/2 TABLET BY MOUTH EVERY DAY  ? tadalafil (CIALIS) 20 MG tablet Take 2 tablets (40 mg total) by mouth daily.  ? ?No facility-administered encounter medications on file as of 11/27/2021.  ? ?No results found for: HGBA1C, MICROALBUR  ? ?BP Readings from Last 3 Encounters:  ?08/19/21 (!) 110/58  ?08/13/21 (!) 104/52  ?05/16/21 (!) 102/56  ? ?Patient contacted to confirm telephone appointment with Charlene Brooke, Pharm D, on 12/02/2021 at 9:30. Spoke with patient's husband for the encounter. He stated he will need to be there for the appointment. Please ask for [name].  Please call [phone]. ? ?Do you have any problems getting your medications? No ? ?What is your top health concern you would like to discuss at your upcoming visit? No concerns at this time.  ? ?Have you seen any other providers since your last visit with PCP? Yes Pulmonology and Cardiology ? ?Star Rating Drugs:  ?Medication:  Last Fill: Day Supply ?No star rating drugs noted ? ?Care Gaps: ?Annual wellness visit in last year? No ?Most Recent BP reading: 110/58 ? ?Charlene Brooke,  CPP notified ? ?[name], RMA ?Clinical Pharmacy Assistant ?[phone] ? ? ? ? ?

## 2021-12-02 ENCOUNTER — Ambulatory Visit (INDEPENDENT_AMBULATORY_CARE_PROVIDER_SITE_OTHER): Payer: Medicare HMO | Admitting: Pharmacist

## 2021-12-02 ENCOUNTER — Other Ambulatory Visit: Payer: Self-pay

## 2021-12-02 DIAGNOSIS — J449 Chronic obstructive pulmonary disease, unspecified: Secondary | ICD-10-CM

## 2021-12-02 DIAGNOSIS — I10 Essential (primary) hypertension: Secondary | ICD-10-CM

## 2021-12-02 DIAGNOSIS — I480 Paroxysmal atrial fibrillation: Secondary | ICD-10-CM

## 2021-12-02 DIAGNOSIS — I5032 Chronic diastolic (congestive) heart failure: Secondary | ICD-10-CM

## 2021-12-02 DIAGNOSIS — I2721 Secondary pulmonary arterial hypertension: Secondary | ICD-10-CM

## 2021-12-02 DIAGNOSIS — E039 Hypothyroidism, unspecified: Secondary | ICD-10-CM

## 2021-12-02 NOTE — Patient Instructions (Signed)
Visit Information ? ?Phone number for Pharmacist: [phone] ? ?Thank you for meeting with me to discuss your medications! I look forward to working with you to achieve your health care goals. Below is a summary of what we talked about during the visit: ? ? Goals Addressed   ?None ?  ? ? ?Care Plan : [place]  ?Updates made by Linda Stevens, [place] since 12/02/2021 12:00 AM  ?  ? ?Problem: Hypertension, Hyperlipidemia, Atrial Fibrillation, Heart Failure, COPD, and Chronic Kidney Disease, PAH   ?Priority: High  ?  ? ?Long-Range Goal: Disease mgmt   ?Start Date: 12/02/2021  ?Expected End Date: 12/03/2022  ?This Visit's Progress: On track  ?Priority: High  ?Note:   ? ?Current Barriers:  ?Unable to tolerate maintenance inhalers ? ?Pharmacist Clinical Goal(s):  ?Patient will contact provider office for questions/concerns as evidenced notation of same in electronic health record through collaboration with PharmD and provider.  ? ?Interventions: ?1:1 collaboration with [name], MD regarding development and update of comprehensive plan of care as evidenced by provider attestation and co-signature ?Inter-disciplinary care team collaboration (see longitudinal plan of care) ?Comprehensive medication review performed; medication list updated in electronic medical record ? ?Hypertension / Heart Failure (BP goal <130/80) ?-Controlled - pt husband reports she has not needed extra furosemide in months ?-Last ejection fraction: 60-65% (Date: 05/04/21) ?-HF type: Diastolic w/ RV failure; NYHA Class: III (marked limitation of activity) ?-Follows with HF Clinic (Dr Haroldine Laws) ?-Current treatment: ?Furosemide 40 mg daily - Appropriate, Effective, Safe, Accessible ?Spironolactone 25 mg - 1/2 tab daily -Appropriate, Effective, Safe, Accessible ? Klor Con 20 mEq daily -Appropriate, Effective, Safe, Accessible ?-Medications previously tried: n/a  ?-Denies hypotensive/hypertensive symptoms ?-Educated on BP goals and  benefits of medications for prevention of heart attack, stroke and kidney damage; ?-Counseled to monitor BP at home weekly, document, and provide log at future appointments ?-Recommended to continue current medication ? ?Pulmonary arterial hypertension (Goal: manage symptoms) ?-Controlled - pt husband reports they have assistance for PAH medications ?-Follows with HF clinic (Dr Haroldine Laws) ?-Current treatment  ?Ambrisentan 10 mg daily - Appropriate, Effective, Safe, Accessible ?Tadalafil 20 mg - 2 tab daily - Appropriate, Effective, Safe, Accessible ?-Medications previously tried: n/a  ?-Recommended to continue current medication ? ?Atrial Fibrillation (Goal: prevent stroke and major bleeding) ?-Controlled -CHADSVASC: 4; hx life-threatening GI bleed 05/2020, off anticoagulation d/t AVMs ?-Current treatment: ?None ?-Medications previously tried: Eliquis, amiodarone ?-Continue to monitor ? ?COPD (Goal: control symptoms and prevent exacerbations) ?-Not ideally controlled - pt stopped using Breztri as it made coughing worse (even with spacer); pt is using albuterol 2-3 time daily; pt has never had nebulizers at home ?-Gold Grade: Gold 2 (FEV1 50-79%); Current COPD Classification:  D (high sx, >/=2 exacerbations/yr) ?-Pulmonary function testing: PFT's 12/06/2015 FEV1 1.17 (52 %) ratio 62 p 16 % ?-Exacerbations requiring treatment in last 6 months: 0 ?-Current treatment  ?Breztri 160-9-4.8 mcg/act 2 puff BID - not taking ?Albuterol HFA prn - Appropriate, Effective, Safe, Accessible ?O2 4-6L ?-Medications previously tried: Trelegy (worsened cough), Breztri (worsened cough) ?-Patient denies consistent use of maintenance inhaler ?-Frequency of rescue inhaler use: 2-3 x daily ?-Counseled on Benefits of consistent maintenance inhaler use ?-Discussed possibility of using Symbicort for longer acting LABA/ICS since it is same device as albuterol; pt does not want to try another inhaler right now ?-Recommended to continue current  medication; consider Symbicort in future ? ?Hx GI bleed (Goal: mange symptoms) ?-Controlled - pt is taking PPI once  daily, not twice; this is reasonable given length of time since GI bleed ?-Severe GI bleed 05/2020 on Eliquis; off A/C now ?-Current treatment  ?Pantoprazole 40 mg BID - taking once daily- Appropriate, Effective, Safe, Accessible ?-Medications previously tried: n/a  ?-Discussed benefits of PPI for GI protection ?-Recommended to continue current medication ? ?Hypothyroidism (Goal: maintain TSH in goal range) ?-Not ideally controlled - TSH was elevated in hospital 05/2021, pt had not been taking levothyroxine at home; pt was advised to start 25 mcg at PCP f/u appt Sept, pt is apparently now taking 75 mcg tablet because that's what they had refills of ?-Current treatment  ?Levothyroxine 75 mcg daily HS - Appropriate, Query effective ?-Medications previously tried: n/a  ?-Discussed need for PCP f/u for thryoid levels; pt will schedule appt when they can ?-Recommend repeat TSH as soon as able ? ?Health Maintenance ?-Vaccine gaps: Shingrix, Flu, Covid booster ? -husband reports pt is sick for 3 days following flu shots. She did not get one this season ?-Using home-made cough syrup: Rye whiskey, Raw candy ?-Current therapy:  ?Hydroxyzine 50 mg HS prn anxiety ?Ferrous sulfate 325 mg BID ?Fluticasone nasal spray  ?Loratadine 10 mg ?Tylenol 500 mg PRN ?Aspirin 81 mg ?-Patient is satisfied with current therapy and denies issues ?-Recommended to continue current medication ? ?Patient Goals/Self-Care Activities ?Patient will:  ?- take medications as prescribed as evidenced by patient report and record review ?focus on medication adherence by pill box ?collaborate with provider on medication access solutions ?  ?  ? ?Linda Stevens was given information about Chronic Care Management services today including:  ?CCM service includes personalized support from designated clinical staff supervised by her physician,  including individualized plan of care and coordination with other care providers ?24/7 contact phone numbers for assistance for urgent and routine care needs. ?Standard insurance, coinsurance, copays and deductibles apply for chronic care management only during months in which we provide at least 20 minutes of these services. Most insurances cover these services at 100%, however patients may be responsible for any copay, coinsurance and/or deductible if applicable. This service may help you avoid the need for more expensive face-to-face services. ?Only one practitioner may furnish and bill the service in a calendar month. ?The patient may stop CCM services at any time (effective at the end of the month) by phone call to the office staff. ? ?Patient agreed to services and verbal consent obtained.  ? ?Patient verbalizes understanding of instructions and care plan provided today and agrees to view in [place]. Active MyChart status confirmed with patient.   ?Telephone follow up appointment with pharmacy team member scheduled for: 6 months ? ?Charlene Brooke, PharmD, BCACP ?Clinical Pharmacist ?[facility] ?[phone]  ?

## 2021-12-02 NOTE — Progress Notes (Signed)
? ?Chronic Care Management ?Pharmacy Note ? ?12/02/2021 ?Name:  Linda Stevens MRN:  [id number] DOB:  1945-11-05 ? ?Summary: CCM Initial visit ?-Pt was unable to tolerate Breztri even with spacer (made cough worse); she is using albuterol 2-3x per day now, and not willing to try a different inhaler at this time ?-Pt is taking levothyroxine 75 mcg at night, she was meant to restart 25 mcg in Sept but had 75 mcg tablets so this is what she has been taking. Last TSH 8.16 Apr 2021 during hospitalization. ? ?Recommendations/Changes made from today's visit: ?-Consider Symbicort as maintenance inhaler, pt declined today ?-Recommend repeat TSH -advised pt to set up PCP f/u appt, they will call to make appt after looking at their calendar ? ?Plan: ?-[facility] will call patient 1 month for thyroid update ?-Pharmacist follow up televisit scheduled for 6 months ? ? ? ?Subjective: ?Linda Stevens is an 76 y.o. year old female who is a primary patient of Bedsole, Amy E, MD.  The CCM team was consulted for assistance with disease management and care coordination needs.   ? ?Engaged with patient by telephone for initial visit in response to provider referral for pharmacy case management and/or care coordination services.  ? ?Consent to Services:  ?The patient was given the following information about Chronic Care Management services today, agreed to services, and gave verbal consent: 1. CCM service includes personalized support from designated clinical staff supervised by the primary care provider, including individualized plan of care and coordination with other care providers 2. 24/7 contact phone numbers for assistance for urgent and routine care needs. 3. Service will only be billed when office clinical staff spend 20 minutes or more in a month to coordinate care. 4. Only one practitioner may furnish and bill the service in a calendar month. 5.The patient may stop CCM services at any time (effective at the end  of the month) by phone call to the office staff. 6. The patient will be responsible for cost sharing (co-pay) of up to 20% of the service fee (after annual deductible is met). Patient agreed to services and consent obtained. ? ?Patient Care Team: ?[name], MD as PCP - General (Family Medicine) ?Bensimhon, Shaune Pascal, MD as PCP - Cardiology (Cardiology) ?Tanda Rockers, MD as Consulting Physician (Pulmonary Disease) ?Rutherford Guys, MD as Consulting Physician (Ophthalmology) ?Alanys Godino, Cle[facility] [place], [facility] as Pharmacist (Pharmacist) ? ?Recent office visits: ?05/10/21 Dr Diona Browner OV: hospital f/u; restart levothyroxine 1/2 of 50 mcg. RTC 4-6 wks for TSH (never did labs again) ? ?Recent consult visits: ?08/19/2021 [name], NP (Pulmonology): COPD Start: [name]. Start: Flonase 50 MCG Start: Claritin 10 mg Start: Prednisone 10 mg. Increase: Protonix 40 mg BID Start: Spacer with Breztri ? ?08/13/2021 [name], MD (Cardiology): CHF, [place].  No med changes. ? ?Hospital visits: ?None in previous 6 months ?8/26-22-05/08/21 admission for CHF/COPD exacerbation. Discharged with [facility]. ? ? ?Objective: ? ?Lab Results  ?Component Value Date  ? CREATININE 1.61 (H) 05/16/2021  ? BUN 21 05/16/2021  ? GFR 44.18 (L) 06/19/2020  ? GFRNONAA 34 (L) 05/16/2021  ? GFRAA 48 (L) 06/09/2020  ? NA 141 05/16/2021  ? K 4.6 05/16/2021  ? CALCIUM 8.2 (L) 05/16/2021  ? CO2 37 (H) 05/16/2021  ? GLUCOSE 124 (H) 05/16/2021  ? ? ?Lab Results  ?Component Value Date/Time  ? GFR 44.18 (L) 06/19/2020 03:57 PM  ? GFR 48.32 (L) 04/10/2020 10:48 AM  ?  ?Last diabetic Eye exam: No results  found for: HMDIABEYEEXA  ?Last diabetic Foot exam: No results found for: HMDIABFOOTEX  ? ?Lab Results  ?Component Value Date  ? CHOL 165 03/26/2020  ? HDL 59.80 03/26/2020  ? LDLCALC 81 03/26/2020  ? TRIG 119.0 03/26/2020  ? CHOLHDL 3 03/26/2020  ? ? ? ?  Latest Ref Rng & Units 05/03/2021  ?  1:30 PM 02/29/2020  ?  2:31 PM 02/26/2020  ?  7:18 AM  ?Hepatic Function  ?Total  Protein 6.5 - 8.1 g/dL 6.4   5.3   5.5    ?Albumin 3.5 - 5.0 g/dL 2.6   2.8   2.8    ?AST 15 - 41 U/L 18   17   13    ?ALT 0 - 44 U/L 7   22   27    ?Alk Phosphatase 38 - 126 U/L 48   78   78    ?Total Bilirubin 0.3 - 1.2 mg/dL 0.5   1.0   0.6    ?Bilirubin, Direct 0.0 - 0.2 mg/dL 0.1      ? ? ?Lab Results  ?Component Value Date/Time  ? TSH 8.955 (H) 05/03/2021 02:29 PM  ? TSH 4.33 01/30/2021 03:35 PM  ? TSH 4.75 (H) 12/26/2020 12:26 PM  ? FREET4 1.08 01/30/2021 03:35 PM  ? FREET4 1.45 12/26/2020 12:26 PM  ? ? ? ?  Latest Ref Rng & Units 05/16/2021  ?  3:52 PM 05/05/2021  ?  4:12 AM 05/04/2021  ?  4:06 AM  ?CBC  ?WBC 4.0 - 10.5 K/uL 5.7   7.5   4.2    ?Hemoglobin 12.0 - 15.0 g/dL 9.8   8.6   9.0    ?Hematocrit 36.0 - 46.0 % 32.4   28.0   29.4    ?Platelets 150 - 400 K/uL 146   146   141    ? ? ?No results found for: VD25OH ? ?Clinical ASCVD: No  ?The 10-year ASCVD risk score (Arnett DK, et al., 2019) is: 12.5% ?  Values used to calculate the score: ?    Age: 76 years ?    Sex: Female ?    Is Non-Hispanic African American: No ?    Diabetic: No ?    Tobacco smoker: No ?    Systolic Blood Pressure: 110 mmHg ?    Is BP treated: Yes ?    HDL Cholesterol: 59.8 mg/dL ?    Total Cholesterol: 165 mg/dL   ? ? ?  07/19/2018  ?  2:48 PM  ?Depression screen PHQ 2/9  ?Decreased Interest 0  ?Down, Depressed, Hopeless 0  ?PHQ - 2 Score 0  ?Altered sleeping 0  ?Tired, decreased energy 0  ?Change in appetite 0  ?Feeling bad or failure about yourself  0  ?Trouble concentrating 0  ?Moving slowly or fidgety/restless 0  ?Suicidal thoughts 0  ?PHQ-9 Score 0  ?Difficult doing work/chores Not difficult at all  ?  ? ?CHA2DS2/VAS Stroke Risk Points  Current as of 4 days ago  ?   4 >= 2 Points: High Risk  ?1 - 1.99 Points: Medium Risk  ?0 Points: Low Risk  ?  Last Change: N/A   ? ?  ? Points Metrics  ?1 Has Congestive Heart Failure:  Yes   ? Current as of 4 days ago  ?0 Has Vascular Disease:  No   ? Current as of 4 days ago  ?1 Has Hypertension:   Yes   ? Current as of   4 days ago  ?1 Age:  89   ? Current as of 4 days ago  ?0 Has Diabetes:  No   ? Current as of 4 days ago  ?0 Had Stroke:  No  Had TIA:  No  Had Thromboembolism:  No   ? Current as of 4 days ago  ?1 Female:  Yes   ? Current as of 4 days ago  ?  ?  ? ?Social History  ? ?Tobacco Use  ?Smoking Status Former  ? Packs/day: 1.00  ? Years: 40.00  ? Pack years: 40.00  ? Types: Cigarettes  ? Quit date: 02/22/2014  ? Years since quitting: 7.7  ?Smokeless Tobacco Never  ? ?BP Readings from Last 3 Encounters:  ?08/19/21 (!) 110/58  ?08/13/21 (!) 104/52  ?05/16/21 (!) 102/56  ? ?Pulse Readings from Last 3 Encounters:  ?08/13/21 87  ?05/16/21 77  ?05/10/21 73  ? ?Wt Readings from Last 3 Encounters:  ?08/19/21 132 lb 9.6 oz (60.1 kg)  ?08/13/21 130 lb 9.6 oz (59.2 kg)  ?05/10/21 138 lb 4 oz (62.7 kg)  ? ?BMI Readings from Last 3 Encounters:  ?08/19/21 24.25 kg/m?  ?08/13/21 22.77 kg/m?  ?05/10/21 24.11 kg/m?  ? ? ?Assessment/Interventions: Review of patient past medical history, allergies, medications, health status, including review of consultants reports, laboratory and other test data, was performed as part of comprehensive evaluation and provision of chronic care management services.  ? ?SDOH:  (Social Determinants of Health) assessments and interventions performed: No - done Aug 2022 ? ?SDOH Screenings  ? ?Alcohol Screen: Not on file  ?Depression (PHQ2-9): Not on file  ?Financial Resource Strain: Low Risk   ? Difficulty of Paying Living Expenses: Not very hard  ?Food Insecurity: No Food Insecurity  ? Worried About Charity fundraiser in the Last Year: Never true  ? Ran Out of Food in the Last Year: Never true  ?Housing: Low Risk   ? Last Housing Risk Score: 0  ?Physical Activity: Not on file  ?Social Connections: Not on file  ?Stress: Not on file  ?Tobacco Use: Medium Risk  ? Smoking Tobacco Use: Former  ? Smokeless Tobacco Use: Never  ? Passive Exposure: Not on file  ?Transportation Needs: No  Transportation Needs  ? Lack of Transportation (Medical): No  ? Lack of Transportation (Non-Medical): No  ? ? ?CCM Care Plan ? ?Allergies  ?Allergen Reactions  ? Amoxicillin-Pot Clavulanate Other (See Comments)  ?

## 2021-12-06 DIAGNOSIS — J449 Chronic obstructive pulmonary disease, unspecified: Secondary | ICD-10-CM | POA: Diagnosis not present

## 2021-12-06 DIAGNOSIS — I272 Pulmonary hypertension, unspecified: Secondary | ICD-10-CM | POA: Diagnosis not present

## 2021-12-06 DIAGNOSIS — R062 Wheezing: Secondary | ICD-10-CM | POA: Diagnosis not present

## 2021-12-06 DIAGNOSIS — I5030 Unspecified diastolic (congestive) heart failure: Secondary | ICD-10-CM

## 2021-12-06 DIAGNOSIS — E039 Hypothyroidism, unspecified: Secondary | ICD-10-CM

## 2021-12-06 DIAGNOSIS — I4891 Unspecified atrial fibrillation: Secondary | ICD-10-CM | POA: Diagnosis not present

## 2021-12-06 DIAGNOSIS — I110 Hypertensive heart disease with heart failure: Secondary | ICD-10-CM | POA: Diagnosis not present

## 2021-12-06 DIAGNOSIS — J9601 Acute respiratory failure with hypoxia: Secondary | ICD-10-CM | POA: Diagnosis not present

## 2021-12-06 DIAGNOSIS — J45909 Unspecified asthma, uncomplicated: Secondary | ICD-10-CM | POA: Diagnosis not present

## 2021-12-13 ENCOUNTER — Telehealth (HOSPITAL_COMMUNITY): Payer: Self-pay | Admitting: *Deleted

## 2021-12-13 NOTE — Telephone Encounter (Signed)
Pts daughter left vm requesting a script for prednisone because pt is having issues with her allergies. Daughter asked that we call pt back directly. I left a detailed vm informing pt to contact her pcp for script.  ?

## 2021-12-26 ENCOUNTER — Other Ambulatory Visit (HOSPITAL_COMMUNITY): Payer: Self-pay | Admitting: Internal Medicine

## 2022-01-05 DIAGNOSIS — J9601 Acute respiratory failure with hypoxia: Secondary | ICD-10-CM | POA: Diagnosis not present

## 2022-01-05 DIAGNOSIS — J449 Chronic obstructive pulmonary disease, unspecified: Secondary | ICD-10-CM | POA: Diagnosis not present

## 2022-01-05 DIAGNOSIS — J45909 Unspecified asthma, uncomplicated: Secondary | ICD-10-CM | POA: Diagnosis not present

## 2022-01-05 DIAGNOSIS — R062 Wheezing: Secondary | ICD-10-CM | POA: Diagnosis not present

## 2022-01-06 ENCOUNTER — Other Ambulatory Visit (HOSPITAL_COMMUNITY): Payer: Self-pay | Admitting: Internal Medicine

## 2022-01-08 ENCOUNTER — Other Ambulatory Visit (INDEPENDENT_AMBULATORY_CARE_PROVIDER_SITE_OTHER): Payer: Medicare HMO

## 2022-01-08 ENCOUNTER — Telehealth: Payer: Self-pay | Admitting: Family Medicine

## 2022-01-08 DIAGNOSIS — R5383 Other fatigue: Secondary | ICD-10-CM

## 2022-01-08 LAB — CBC WITH DIFFERENTIAL/PLATELET
Basophils Absolute: 0 10*3/uL (ref 0.0–0.1)
Basophils Relative: 0.5 % (ref 0.0–3.0)
Eosinophils Absolute: 0.2 10*3/uL (ref 0.0–0.7)
Eosinophils Relative: 3.9 % (ref 0.0–5.0)
HCT: 31.9 % — ABNORMAL LOW (ref 36.0–46.0)
Hemoglobin: 10.3 g/dL — ABNORMAL LOW (ref 12.0–15.0)
Lymphocytes Relative: 10.4 % — ABNORMAL LOW (ref 12.0–46.0)
Lymphs Abs: 0.5 10*3/uL — ABNORMAL LOW (ref 0.7–4.0)
MCHC: 32.3 g/dL (ref 30.0–36.0)
MCV: 98.2 fl (ref 78.0–100.0)
Monocytes Absolute: 0.4 10*3/uL (ref 0.1–1.0)
Monocytes Relative: 9 % (ref 3.0–12.0)
Neutro Abs: 3.5 10*3/uL (ref 1.4–7.7)
Neutrophils Relative %: 76.2 % (ref 43.0–77.0)
Platelets: 143 10*3/uL — ABNORMAL LOW (ref 150.0–400.0)
RBC: 3.25 Mil/uL — ABNORMAL LOW (ref 3.87–5.11)
RDW: 16 % — ABNORMAL HIGH (ref 11.5–15.5)
WBC: 4.6 10*3/uL (ref 4.0–10.5)

## 2022-01-08 LAB — COMPREHENSIVE METABOLIC PANEL
ALT: 8 U/L (ref 0–35)
AST: 15 U/L (ref 0–37)
Albumin: 3.7 g/dL (ref 3.5–5.2)
Alkaline Phosphatase: 68 U/L (ref 39–117)
BUN: 21 mg/dL (ref 6–23)
CO2: 37 mEq/L — ABNORMAL HIGH (ref 19–32)
Calcium: 9.2 mg/dL (ref 8.4–10.5)
Chloride: 97 mEq/L (ref 96–112)
Creatinine, Ser: 1.24 mg/dL — ABNORMAL HIGH (ref 0.40–1.20)
GFR: 43.12 mL/min — ABNORMAL LOW (ref >60.00)
Glucose, Bld: 103 mg/dL — ABNORMAL HIGH (ref 70–99)
Potassium: 4.1 mEq/L (ref 3.5–5.1)
Sodium: 142 mEq/L (ref 135–145)
Total Bilirubin: 0.4 mg/dL (ref 0.2–1.2)
Total Protein: 6.9 g/dL (ref 6.0–8.3)

## 2022-01-08 LAB — IBC + FERRITIN
Ferritin: 55.6 ng/mL (ref 10.0–291.0)
Iron: 47 ug/dL (ref 42–145)
Saturation Ratios: 17.1 % — ABNORMAL LOW (ref 20.0–50.0)
TIBC: 274.4 ug/dL (ref 250.0–450.0)
Transferrin: 196 mg/dL — ABNORMAL LOW (ref 212.0–360.0)

## 2022-01-08 NOTE — Telephone Encounter (Addendum)
Pt's spouse Linda Stevens) called stating he would like to speak with Linda Stevens, not anyone else. This is about his wife.  ? ?Callback Number: [phone] ?

## 2022-01-08 NOTE — Telephone Encounter (Signed)
I would recommend that she be seen earlier as you have already told them, but if they are not interested in seeing another provider, I could have him bring her in to do labs prior to the appointment, today? ? ? This would be to check a CBC for anemia, etc. ? ?I went ahead and placed the orders but they can be canceled if she does not wish to come in today for labs. ?Orders Placed This Encounter  ?Procedures  ? IBC + Ferritin  ?  Standing Status:   Future  ?  Standing Expiration Date:   01/09/2023  ? CBC with Differential/Platelet  ?  Standing Status:   Future  ?  Standing Expiration Date:   01/09/2023  ? Comprehensive metabolic panel  ?  Standing Status:   Future  ?  Standing Expiration Date:   04/08/2022  ? ? ?

## 2022-01-08 NOTE — Telephone Encounter (Signed)
Spoke with [place].  Lab appointment scheduled today at 11:00 am.  ?

## 2022-01-08 NOTE — Telephone Encounter (Signed)
Spoke with [name].  He states for the past 10 days Linda Stevens has been real nervous and weak.  She will tell him she needs to catch her breath and ask him to open to door so she can get some fresh air.  He states when this happens her SpO2 is about 87-88% but after she breathes in the fresh air for a few minutes it will go back up to 95%. He is wondering if her hgb is low again. Offered appointment today but they only want to see Dr. Diona Browner.  Appointment scheduled for 01/10/22 at 9:20am.  ER precautions given.   ?

## 2022-01-09 ENCOUNTER — Other Ambulatory Visit: Payer: Self-pay | Admitting: Family Medicine

## 2022-01-10 ENCOUNTER — Ambulatory Visit (INDEPENDENT_AMBULATORY_CARE_PROVIDER_SITE_OTHER): Payer: Medicare HMO | Admitting: Family Medicine

## 2022-01-10 ENCOUNTER — Encounter: Payer: Self-pay | Admitting: Family Medicine

## 2022-01-10 VITALS — BP 105/46 | HR 94 | Temp 98.5°F

## 2022-01-10 DIAGNOSIS — J301 Allergic rhinitis due to pollen: Secondary | ICD-10-CM | POA: Diagnosis not present

## 2022-01-10 DIAGNOSIS — I5032 Chronic diastolic (congestive) heart failure: Secondary | ICD-10-CM

## 2022-01-10 DIAGNOSIS — R0602 Shortness of breath: Secondary | ICD-10-CM

## 2022-01-10 DIAGNOSIS — J449 Chronic obstructive pulmonary disease, unspecified: Secondary | ICD-10-CM | POA: Diagnosis not present

## 2022-01-10 NOTE — Assessment & Plan Note (Signed)
Acute worsening of chronic shortness of breath ? ?No clear sign of infection or COPD exacerbation.  No clear sign of CHF exacerbation.  Potentially due to worsening allergies.  Oxygen always seems to come back up with fresh air and a fan. ?Given her history of anemia CBC was checked and returned stable hemoglobin.  Offered further laboratory testing to rule out worsening thyroid issues causing the shortness of breath but she is not interested at this time. ?

## 2022-01-10 NOTE — Assessment & Plan Note (Signed)
Chronic, stable control ?She does appear somewhat dehydrated today given her low blood pressure but denies lightheadedness. ?He is currently managed tenuously on Lasix 40 mg twice daily and spironolactone half tablet 25 mg daily. ? ?

## 2022-01-10 NOTE — Progress Notes (Signed)
? ? Patient ID: Linda Stevens, female    DOB: [birth year], 76 y.o.   MRN: [id number] ? ?This visit was conducted in person. ? ?BP (!) 105/46   Pulse 94   Temp 98.5 ?F (36.9 ?C) (Oral)   SpO2 94% Comment: 4 L O2  ? ?CC:  ?Chief Complaint  ?Patient presents with  ? Shortness of Breath  ?   ?  ? Weakness  ? ? ?Subjective:  ? ?HPI: ?Linda Stevens is a 76 y.o. female with severe COPD, history of atrial fibrillation, hepatic cirrhosis, hypothyroidism and iron deficiency anemia  (from GI bleed from colonic AVMs ) presenting on 01/10/2022 for Shortness of Breath (/) and Weakness ? ? ?She presents today with her husband given increased weakness and shortness of breath over the last several weeks. ?She  is having spells of not being able to get her breath on  3-5 L ( 87-88%)... opens door to get cool air in.. O2 sat comes up at O2 goes up to  93-96% ?This has been occurring every 4-5 days. ?She is also having watery eyes, sneezing fits ? No fever, no change in cough.. no color change in mucus. ? Using flonase. ? ?Given her history of anemia prior to coming to the office visit today she had CBC and iron panel. ?Her hemoglobin was stable and actually little better than previous. ?   ? She does not drink much liquids.[name] drinks pepsi and OJ.. 32 oz. ? She denies lightheadedness. ? She is using lasix 40 mg BID, spironolactone ?BP Readings from Last 3 Encounters:  ?01/10/22 (!) 105/46  ?08/19/21 (!) 110/58  ?08/13/21 (!) 104/52  ? ? ? ?Relevant past medical, surgical, family and social history reviewed and updated as indicated. Interim medical history since our last visit reviewed. ?Allergies and medications reviewed and updated. ?Outpatient Medications Prior to Visit  ?Medication Sig Dispense Refill  ? acetaminophen (TYLENOL) 500 MG tablet Take 500 mg by mouth every 4 (four) hours as needed for headache (pain).    ? albuterol (VENTOLIN HFA) 108 (90 Base) MCG/ACT inhaler INHALE 2 PUFFS INTO THE LUNGS EVERY 6 HOURS AS  NEEDED FOR WHEEZING/SHORTNESS OF BREATH 36 each 0  ? ambrisentan (LETAIRIS) 10 MG tablet TAKE 1 TABLET BY MOUTH 1 TIME A DAY. DO NOT HANDLE IF PREGNANT. 30 tablet 1  ? aspirin EC 81 MG EC tablet Take 1 tablet (81 mg total) by mouth daily. 30 tablet 0  ? ferrous sulfate 325 (65 FE) MG EC tablet TAKE 1 TABLET BY MOUTH TWICE A DAY WITH MEALS 180 tablet 1  ? fluticasone (FLONASE) 50 MCG/ACT nasal spray Place 1 spray into both nostrils daily. 18.2 mL 2  ? furosemide (LASIX) 40 MG tablet TAKE 1 TABLET BY MOUTH TWICE A DAY 180 tablet 3  ? hydrOXYzine (ATARAX) 50 MG tablet TAKE 1/2 TO 1 TABLET BY MOUTH AT BEDTIME FOR ANXIETY 90 tablet 1  ? KLOR-CON M20 20 MEQ tablet TAKE 1 TABLET BY MOUTH EVERY DAY 90 tablet 0  ? levothyroxine (SYNTHROID) 75 MCG tablet Take 75 mcg by mouth daily before breakfast.    ? loratadine (CLARITIN) 10 MG tablet TAKE 1 TABLET BY MOUTH EVERY DAY 90 tablet 1  ? OXYGEN Inhale 4 L into the lungs continuous.    ? pantoprazole (PROTONIX) 40 MG tablet Take 40 mg by mouth daily.    ? polyvinyl alcohol (LIQUIFILM TEARS) 1.4 % ophthalmic solution Place 1 drop into both eyes daily as needed for dry  eyes.    ? spironolactone (ALDACTONE) 25 MG tablet TAKE 1/2 TABLET BY MOUTH EVERY DAY 45 tablet 3  ? tadalafil (CIALIS) 20 MG tablet Take 2 tablets (40 mg total) by mouth daily. 180 tablet 3  ? pantoprazole (PROTONIX) 40 MG tablet TAKE 1 TABLET BY MOUTH TWICE A DAY (Patient taking differently: Take 40 mg by mouth daily.) 180 tablet 1  ? ?No facility-administered medications prior to visit.  ?  ? ?Per HPI unless specifically indicated in ROS section below ?Review of Systems  ?Constitutional:  Negative for fatigue and fever.  ?HENT:  Negative for congestion.   ?Eyes:  Negative for pain.  ?Respiratory:  Negative for cough and shortness of breath.   ?Cardiovascular:  Negative for chest pain, palpitations and leg swelling.  ?Gastrointestinal:  Negative for abdominal pain.  ?Genitourinary:  Negative for dysuria and vaginal  bleeding.  ?Musculoskeletal:  Negative for back pain.  ?Neurological:  Negative for syncope, light-headedness and headaches.  ?Psychiatric/Behavioral:  Negative for dysphoric mood.   ?Objective:  ?BP (!) 105/46   Pulse 94   Temp 98.5 ?F (36.9 ?C) (Oral)   SpO2 94% Comment: 4 L O2  ?Wt Readings from Last 3 Encounters:  ?08/19/21 132 lb 9.6 oz (60.1 kg)  ?08/13/21 130 lb 9.6 oz (59.2 kg)  ?05/10/21 138 lb 4 oz (62.7 kg)  ?  ?  ?Physical Exam ?Constitutional:   ?   General: She is not in acute distress. ?   Appearance: She is underweight. She is not ill-appearing or toxic-appearing.  ?   Interventions: Nasal cannula in place.  ?   Comments: On continuous oxygen  ?HENT:  ?   Head: Normocephalic.  ?   Right Ear: Hearing, tympanic membrane, ear canal and external ear normal. Tympanic membrane is not erythematous, retracted or bulging.  ?   Left Ear: Hearing, tympanic membrane, ear canal and external ear normal. Tympanic membrane is not erythematous, retracted or bulging.  ?   Nose: No mucosal edema or rhinorrhea.  ?   Right Sinus: No maxillary sinus tenderness or frontal sinus tenderness.  ?   Left Sinus: No maxillary sinus tenderness or frontal sinus tenderness.  ?   Mouth/Throat:  ?   Pharynx: Uvula midline.  ?Eyes:  ?   General: Lids are normal. Lids are everted, no foreign bodies appreciated.  ?   Extraocular Movements: Extraocular movements intact.  ?   Conjunctiva/sclera:  ?   Right eye: Right conjunctiva is injected.  ?   Left eye: Left conjunctiva is injected.  ?   Pupils: Pupils are equal, round, and reactive to light.  ?   Comments: Bilateral eyes watery, patient constantly mopping with tissue.  She did not take her antihistamine eyedrops this morning.  ?Neck:  ?   Thyroid: No thyroid mass or thyromegaly.  ?   Vascular: No carotid bruit.  ?   Trachea: Trachea normal.  ?Cardiovascular:  ?   Rate and Rhythm: Normal rate and regular rhythm.  ?   Pulses: Normal pulses.  ?   Heart sounds: Normal heart sounds, S1  normal and S2 normal. No murmur heard. ?  No friction rub. No gallop.  ?Pulmonary:  ?   Effort: Pulmonary effort is normal. No tachypnea or respiratory distress.  ?   Breath sounds: Decreased air movement present. Wheezing present. No decreased breath sounds, rhonchi or rales.  ?Abdominal:  ?   General: Bowel sounds are normal.  ?   Palpations: Abdomen is soft.  ?  Tenderness: There is no abdominal tenderness.  ?Musculoskeletal:  ?   Cervical back: Normal range of motion and neck supple.  ?Skin: ?   General: Skin is warm and dry.  ?   Findings: No rash.  ?Neurological:  ?   Mental Status: She is alert.  ?Psychiatric:     ?   Mood and Affect: Mood is not anxious or depressed.     ?   Speech: Speech normal.     ?   Behavior: Behavior normal. Behavior is cooperative.     ?   Thought Content: Thought content normal.     ?   Judgment: Judgment normal.  ? ?   ?Results for orders placed or performed in visit on 01/08/22  ?Comprehensive metabolic panel  ?Result Value Ref Range  ? Sodium 142 135 - 145 mEq/L  ? Potassium 4.1 3.5 - 5.1 mEq/L  ? Chloride 97 96 - 112 mEq/L  ? CO2 37 (H) 19 - 32 mEq/L  ? Glucose, Bld 103 (H) 70 - 99 mg/dL  ? BUN 21 6 - 23 mg/dL  ? Creatinine, Ser 1.24 (H) 0.40 - 1.20 mg/dL  ? Total Bilirubin 0.4 0.2 - 1.2 mg/dL  ? Alkaline Phosphatase 68 39 - 117 U/L  ? AST 15 0 - 37 U/L  ? ALT 8 0 - 35 U/L  ? Total Protein 6.9 6.0 - 8.3 g/dL  ? Albumin 3.7 3.5 - 5.2 g/dL  ? GFR 43.12 (L) >60.00 mL/min  ? Calcium 9.2 8.4 - 10.5 mg/dL  ?CBC with Differential/Platelet  ?Result Value Ref Range  ? WBC 4.6 4.0 - 10.5 K/uL  ? RBC 3.25 (L) 3.87 - 5.11 Mil/uL  ? Hemoglobin 10.3 (L) 12.0 - 15.0 g/dL  ? HCT 31.9 (L) 36.0 - 46.0 %  ? MCV 98.2 78.0 - 100.0 fl  ? MCHC 32.3 30.0 - 36.0 g/dL  ? RDW 16.0 (H) 11.5 - 15.5 %  ? Platelets 143.0 (L) 150.0 - 400.0 K/uL  ? Neutrophils Relative % 76.2 43.0 - 77.0 %  ? Lymphocytes Relative 10.4 (L) 12.0 - 46.0 %  ? Monocytes Relative 9.0 3.0 - 12.0 %  ? Eosinophils Relative 3.9 0.0 -  5.0 %  ? Basophils Relative 0.5 0.0 - 3.0 %  ? Neutro Abs 3.5 1.4 - 7.7 K/uL  ? Lymphs Abs 0.5 (L) 0.7 - 4.0 K/uL  ? Monocytes Absolute 0.4 0.1 - 1.0 K/uL  ? Eosinophils Absolute 0.2 0.0 - 0.7 K/uL  ? Basophils A

## 2022-01-10 NOTE — Assessment & Plan Note (Signed)
Acute worsening ? ?She reports significant worsening of this in the last several weeks.  She feels this is primarily the cause of her shortness of breath.  She will increase Flonase to 2 sprays per nostril daily.  She will try a change to Xyzal from Claritin if the increased dose of Flonase does not work.  She will continue over-the-counter antihistamine eyedrops. ?

## 2022-01-10 NOTE — Patient Instructions (Addendum)
Increase fluticasone nasal spray TWO sprays per nostril daily. ? Can consider switching Claritin to Xyzal  at bedtime for allergies. ? Let me know if not feeling better. ? Increase water intake. ?

## 2022-01-10 NOTE — Assessment & Plan Note (Signed)
Chronic, no clear worsening from baseline. ?No clear sign of ongoing bacterial infection. ?She will follow-up with pulmonary as planned ? ?

## 2022-01-28 ENCOUNTER — Telehealth: Payer: Self-pay | Admitting: Family Medicine

## 2022-01-28 NOTE — Telephone Encounter (Signed)
Left message for patient to call back and schedule Medicare Annual Wellness Visit (AWV) either virtually or phone   Last AWV ;07/19/18  please schedule at anytime with health coach  I left my direct # [phone]

## 2022-01-29 ENCOUNTER — Other Ambulatory Visit: Payer: Self-pay

## 2022-01-29 DIAGNOSIS — Z743 Need for continuous supervision: Secondary | ICD-10-CM | POA: Diagnosis not present

## 2022-01-29 DIAGNOSIS — Z87891 Personal history of nicotine dependence: Secondary | ICD-10-CM | POA: Diagnosis not present

## 2022-01-29 DIAGNOSIS — R040 Epistaxis: Secondary | ICD-10-CM | POA: Diagnosis not present

## 2022-01-29 DIAGNOSIS — I509 Heart failure, unspecified: Secondary | ICD-10-CM | POA: Diagnosis not present

## 2022-01-29 DIAGNOSIS — J449 Chronic obstructive pulmonary disease, unspecified: Secondary | ICD-10-CM | POA: Insufficient documentation

## 2022-01-29 DIAGNOSIS — J45909 Unspecified asthma, uncomplicated: Secondary | ICD-10-CM | POA: Diagnosis not present

## 2022-01-29 DIAGNOSIS — Z7982 Long term (current) use of aspirin: Secondary | ICD-10-CM | POA: Diagnosis not present

## 2022-01-29 DIAGNOSIS — I110 Hypertensive heart disease with heart failure: Secondary | ICD-10-CM | POA: Insufficient documentation

## 2022-01-29 DIAGNOSIS — I959 Hypotension, unspecified: Secondary | ICD-10-CM | POA: Diagnosis not present

## 2022-01-29 NOTE — ED Triage Notes (Signed)
Pt bib EMS, c/o nose bleed started around 1pm, pt denies injury. Bleeding controlled by EMS, no bleeding in triage. Pt on chronic O2 @ 4-5 LPM. Takes ASA 51m daily. Husband reports intermittent pollen allergy x 1 week. No chest pain, no headache, no dizziness.

## 2022-01-29 NOTE — ED Notes (Signed)
Pt called for a nurse in the lobby requesting update on time. Pt's husband states that she looks more pale than when she first came in and is concerned of blood loss from nose bleed. Vital signs repeated and stable. O2 tank replaced by RT. Pt returned to waiting room.

## 2022-01-29 NOTE — ED Notes (Signed)
Oxygen tank replaced with full tank. Patient placed on 4L.

## 2022-01-30 ENCOUNTER — Encounter (HOSPITAL_BASED_OUTPATIENT_CLINIC_OR_DEPARTMENT_OTHER): Payer: Self-pay

## 2022-01-30 ENCOUNTER — Emergency Department (HOSPITAL_BASED_OUTPATIENT_CLINIC_OR_DEPARTMENT_OTHER)
Admission: EM | Admit: 2022-01-30 | Discharge: 2022-01-30 | Disposition: A | Discharge status: Home / Self Care | Payer: Medicare HMO | Attending: Emergency Medicine | Admitting: Emergency Medicine

## 2022-01-30 DIAGNOSIS — R040 Epistaxis: Secondary | ICD-10-CM

## 2022-01-30 MED ORDER — ONDANSETRON 4 MG PO TBDP
4.0000 mg | ORAL_TABLET | Freq: Once | ORAL | Status: AC | PRN
  Administered 2022-01-30: 4 mg via ORAL
  Filled 2022-01-30: qty 1

## 2022-01-30 MED ORDER — OXYMETAZOLINE HCL 0.05 % NA SOLN
3.0000 | NASAL | Status: DC | PRN
  Filled 2022-01-30: qty 30

## 2022-01-30 NOTE — ED Notes (Signed)
Dc instructions reviewed with pt and dtg.  Both verbalized understanding

## 2022-01-30 NOTE — ED Notes (Signed)
Pt stated she wanted to stay in the wheelchair and not get into the bed. RN made aware.

## 2022-01-30 NOTE — ED Notes (Signed)
Nausea has improved  Family at bedside

## 2022-01-30 NOTE — ED Notes (Signed)
Pt in Linda Stevens, placed on O2 no active bleeding at this time

## 2022-01-30 NOTE — ED Provider Notes (Signed)
[facility] Provider Note: Georgena Spurling, MD, FACEP  CSN: [id number] MRN: [id number] ARRIVAL: 01/29/22 at [place]: [id number]   CHIEF COMPLAINT  Epistaxis   HISTORY OF PRESENT ILLNESS  01/30/22 2:48 AM Linda Stevens is a 76 y.o. female who developed a nosebleed about 1 PM.  The nosebleed was profuse and she believes it was primarily from the left naris.  There was no trauma or pain associated with this.  She denies any lightheadedness.  There was passage of clots as well as liquid blood.  EMS was summoned and they removed the nasal cannula (she is on chronic oxygen for COPD and CHF) and were able to get the bleeding controlled and she has been hemostatic since.  She is not on anticoagulation.  She does have environmental allergies which she thinks irritates her nose as well as the oxygen which dries her nose out.   Past Medical History:  Diagnosis Date   Acute respiratory failure with hypoxia and hypercapnea 02/26/2014   Allergic rhinitis due to pollen    Allergy    Anemia 06/12/2020   Arthritis    Asthma    CHF (congestive heart failure) (HCC)    COPD (chronic obstructive pulmonary disease) (HCC)    Diastolic dysfunction    a. 02/2014 Echo: EF 65-70%, Gr 1 DD, RVH;  b. 09/2015 Echo: EF 55%, Gr 1 DD.   Emphysema of lung (HCC)    H/O seasonal allergies    History of chicken pox    History of tobacco abuse    a. Quit 2015.   History of UTI    Hypertension    Hypertensive heart disease    Lower extremity edema    Pericardial effusion    a. 09/2015 Echo: Mod eff w/o tamponade.   Right heart failure with reduced right ventricular function ([place])    a. 09/2015 Echo: EF 55%, Gr 1 DD, D shaped IV septum, sev dil RV with mod reduced fxn, mod dil RA, RV-RA grad 50mHg, PASP 866mg, mod pericard eff w/o tamponade.   Severe Pulmonary Hypertension    a. 09/2015 Echo: PASP 8755m.    Past Surgical History:  Procedure Laterality Date   CARDIAC CATHETERIZATION N/A 10/08/2015    Procedure: Right/Left Heart Cath and Coronary Angiography;  Surgeon: DanJolaine ArtistD;  Location: [facility];  Service: Cardiovascular;  Laterality: N/A;   CHOLECYSTECTOMY     Open   COLONOSCOPY WITH PROPOFOL N/A 06/07/2020   Procedure: COLONOSCOPY WITH PROPOFOL;  Surgeon: PyrJerene BearsD;  [facility];  Laterality: N/A;   ESOPHAGOGASTRODUODENOSCOPY (EGD) WITH PROPOFOL N/A 06/07/2020   Procedure: ESOPHAGOGASTRODUODENOSCOPY (EGD) WITH PROPOFOL;  Surgeon: PyrJerene BearsD;  [facility];  Service: Gastroenterology;  Laterality: N/A;   POLYPECTOMY  06/07/2020   Procedure: POLYPECTOMY;  Surgeon: PyrJerene BearsD;  [facility];;   RIGHT HEART CATH N/A 01/26/2017   Procedure: Right Heart Cath;  Surgeon: BenJolaine ArtistD;  Location: [facility];  Service: Cardiovascular;  Laterality: N/A;   RIGHT HEART CATH N/A 08/12/2017   Procedure: RIGHT HEART CATH;  Surgeon: BenJolaine ArtistD;  Location: [facility];  Service: Cardiovascular;  Laterality: N/A;    Family History  Problem Relation Age of Onset   Glaucoma Brother    Vascular Disease Father        Died of complications from surgery   Heart attack Father  Asthma Mother        Died of asthma attack   Dementia Mother    Hyperlipidemia Mother    Hypertension Mother     Social History   Tobacco Use   Smoking status: Former    Packs/day: 1.00    Years: 40.00    Pack years: 40.00    Types: Cigarettes    Quit date: 02/22/2014    Years since quitting: 7.9   Smokeless tobacco: Never  Vaping Use   Vaping Use: Never used  Substance Use Topics   Alcohol use: No   Drug use: No    Prior to Admission medications   Medication Sig Start Date End Date Taking? Authorizing Provider  acetaminophen (TYLENOL) 500 MG tablet Take 500 mg by mouth every 4 (four) hours as needed for headache (pain).    [provider]  albuterol (VENTOLIN HFA) 108 (90 Base) MCG/ACT inhaler INHALE 2 PUFFS INTO THE LUNGS EVERY 6 HOURS AS NEEDED FOR WHEEZING/SHORTNESS OF BREATH 10/13/21   Bedsole, Amy E, MD  ambrisentan (LETAIRIS) 10 MG tablet TAKE 1 TABLET BY MOUTH 1 TIME A DAY. DO NOT HANDLE IF PREGNANT. 01/06/22   Bensimhon, Shaune Pascal, MD  aspirin EC 81 MG EC tablet Take 1 tablet (81 mg total) by mouth daily. 10/09/15   Debbe Odea, MD  ferrous sulfate 325 (65 FE) MG EC tablet TAKE 1 TABLET BY MOUTH TWICE A DAY WITH MEALS 01/09/22   Bedsole, Amy E, MD  fluticasone (FLONASE) 50 MCG/ACT nasal spray Place 1 spray into both nostrils daily. 08/19/21   Cobb, Karie Schwalbe, NP  furosemide (LASIX) 40 MG tablet TAKE 1 TABLET BY MOUTH TWICE A DAY 12/26/21   Bensimhon, Shaune Pascal, MD  hydrOXYzine (ATARAX) 50 MG tablet TAKE 1/2 TO 1 TABLET BY MOUTH AT BEDTIME FOR ANXIETY 10/15/21   Bedsole, Amy E, MD  KLOR-CON M20 20 MEQ tablet TAKE 1 TABLET BY MOUTH EVERY DAY 11/21/21   Bedsole, Amy E, MD  levothyroxine (SYNTHROID) 75 MCG tablet Take 75 mcg by mouth daily before breakfast.    [provider]  loratadine (CLARITIN) 10 MG tablet TAKE 1 TABLET BY MOUTH EVERY DAY 10/14/21   Cobb, Karie Schwalbe, NP  OXYGEN Inhale 4 L into the lungs continuous.    [provider]  pantoprazole (PROTONIX) 40 MG tablet Take 40 mg by mouth daily.    [provider]  polyvinyl alcohol (LIQUIFILM TEARS) 1.4 % ophthalmic solution Place 1 drop into both eyes daily as needed for dry eyes.    [provider]  spironolactone (ALDACTONE) 25 MG tablet TAKE 1/2 TABLET BY MOUTH EVERY DAY 11/28/21   Bensimhon, Shaune Pascal, MD  tadalafil (CIALIS) 20 MG tablet Take 2 tablets (40 mg total) by mouth daily. 03/19/21   Bensimhon, Shaune Pascal, MD    Allergies Amoxicillin-pot clavulanate, Cefdinir, Singulair [montelukast sodium], and Moxifloxacin   REVIEW OF SYSTEMS  Negative except as noted here or in the History of Present Illness.   PHYSICAL EXAMINATION   Initial Vital Signs Blood pressure 136/62, pulse 88, temperature 98 F (36.7 C), resp. rate 18, height _0  (1.575 m), weight 56.7 kg, SpO2 97 %.  Examination General: Well-developed, frail-appearing female in no acute distress; appearance consistent with age of record HENT: normocephalic; atraumatic; dried blood in left naris Eyes: Normal appearance Neck: supple Heart: regular rate and rhythm; distant sounds Lungs: Distant sounds Abdomen: soft; nondistended; nontender; bowel sounds present Extremities: No deformity; full range of motion Neurologic:  Awake, alert and oriented; motor function intact in all extremities and symmetric; no facial droop Skin: Warm and dry Psychiatric: Normal mood and affect   RESULTS  Summary of this visit's results, reviewed and interpreted by myself:   EKG Interpretation  Date/Time:    Ventricular Rate:    PR Interval:    QRS Duration:   QT Interval:    QTC Calculation:   R Axis:     Text Interpretation:         Laboratory Studies: No results found for this or any previous visit (from the past 24 hour(s)). Imaging Studies: No results found.  ED COURSE and MDM  Nursing notes, initial and subsequent vitals signs, including pulse oximetry, reviewed and interpreted by myself.  Vitals:   01/29/22 1837 01/29/22 1842 01/29/22 2230 01/30/22 0251  BP: (!) 126/46  136/62 126/60  Pulse: 98  88 (!) 103  Resp: _0 Temp: 98 F (36.7 C)     SpO2: 93%  97% 92%  Weight:  56.7 kg    Height:  _1  (1.575 m)     Medications  oxymetazoline (AFRIN) 0.05 % nasal spray 3 spray (has no administration in time range)  ondansetron (ZOFRAN-ODT) disintegrating tablet 4 mg (4 mg Oral Given 01/30/22 0150)   The patient was given Zofran on arrival due to nausea likely from swallowing blood.  She is hemostatic but we will provide her with Afrin should bleeding recur.   PROCEDURES  Procedures   ED DIAGNOSES     ICD-10-CM   1. Epistaxis  R04.0           Janeli Lewison, MD 01/30/22 [id number]

## 2022-02-05 DIAGNOSIS — J45909 Unspecified asthma, uncomplicated: Secondary | ICD-10-CM | POA: Diagnosis not present

## 2022-02-05 DIAGNOSIS — J9601 Acute respiratory failure with hypoxia: Secondary | ICD-10-CM | POA: Diagnosis not present

## 2022-02-05 DIAGNOSIS — R062 Wheezing: Secondary | ICD-10-CM | POA: Diagnosis not present

## 2022-02-05 DIAGNOSIS — J449 Chronic obstructive pulmonary disease, unspecified: Secondary | ICD-10-CM | POA: Diagnosis not present

## 2022-02-21 ENCOUNTER — Other Ambulatory Visit: Payer: Self-pay | Admitting: Family Medicine

## 2022-03-01 ENCOUNTER — Other Ambulatory Visit: Payer: Self-pay | Admitting: Family Medicine

## 2022-03-05 ENCOUNTER — Other Ambulatory Visit: Payer: Self-pay | Admitting: Family Medicine

## 2022-03-05 DIAGNOSIS — E039 Hypothyroidism, unspecified: Secondary | ICD-10-CM

## 2022-03-07 ENCOUNTER — Other Ambulatory Visit (HOSPITAL_COMMUNITY): Payer: Self-pay

## 2022-03-07 DIAGNOSIS — J45909 Unspecified asthma, uncomplicated: Secondary | ICD-10-CM | POA: Diagnosis not present

## 2022-03-07 DIAGNOSIS — R062 Wheezing: Secondary | ICD-10-CM | POA: Diagnosis not present

## 2022-03-07 DIAGNOSIS — J449 Chronic obstructive pulmonary disease, unspecified: Secondary | ICD-10-CM | POA: Diagnosis not present

## 2022-03-07 DIAGNOSIS — J9601 Acute respiratory failure with hypoxia: Secondary | ICD-10-CM | POA: Diagnosis not present

## 2022-03-07 MED ORDER — AMBRISENTAN 10 MG PO TABS: ORAL_TABLET | ORAL | 3 refills | Status: AC

## 2022-03-07 MED ORDER — TADALAFIL 20 MG PO TABS: 40.0000 mg | ORAL_TABLET | Freq: Every day | ORAL | 0 refills | Status: DC

## 2022-04-01 ENCOUNTER — Telehealth: Payer: Self-pay | Admitting: Family Medicine

## 2022-04-01 NOTE — Telephone Encounter (Signed)
Left message for patient to call back and schedule Medicare Annual Wellness Visit (AWV) either virtually or phone   Last AWV ;07/19/18   I left my direct # [phone]

## 2022-04-07 DIAGNOSIS — J449 Chronic obstructive pulmonary disease, unspecified: Secondary | ICD-10-CM | POA: Diagnosis not present

## 2022-04-07 DIAGNOSIS — J9601 Acute respiratory failure with hypoxia: Secondary | ICD-10-CM | POA: Diagnosis not present

## 2022-04-07 DIAGNOSIS — R062 Wheezing: Secondary | ICD-10-CM | POA: Diagnosis not present

## 2022-04-07 DIAGNOSIS — J45909 Unspecified asthma, uncomplicated: Secondary | ICD-10-CM | POA: Diagnosis not present

## 2022-04-09 ENCOUNTER — Other Ambulatory Visit: Payer: Self-pay | Admitting: Nurse Practitioner

## 2022-04-09 DIAGNOSIS — J301 Allergic rhinitis due to pollen: Secondary | ICD-10-CM

## 2022-04-11 ENCOUNTER — Ambulatory Visit (INDEPENDENT_AMBULATORY_CARE_PROVIDER_SITE_OTHER): Payer: Medicare HMO

## 2022-04-11 VITALS — Ht 62.0 in | Wt 135.0 lb

## 2022-04-11 DIAGNOSIS — Z0000 Encounter for general adult medical examination without abnormal findings: Secondary | ICD-10-CM

## 2022-04-11 NOTE — Patient Instructions (Signed)
Linda Stevens , Thank you for taking time to come for your Medicare Wellness Visit. I appreciate your ongoing commitment to your health goals. Please review the following plan we discussed and let me know if I can assist you in the future.   Screening recommendations/referrals: Colonoscopy: not required Mammogram: decline Bone Density: complete 05/08/2014 Recommended yearly ophthalmology/optometry visit for glaucoma screening and checkup Recommended yearly dental visit for hygiene and checkup  Vaccinations: Influenza vaccine: due Pneumococcal vaccine: completed 01/17/2016 Tdap vaccine: completed 07/23/2018, due 07/23/2028 Shingles vaccine: discussed   Covid-19: 11/07/2019, 10/15/2019  Advanced directives: copy in chart  Conditions/risks identified: none  Next appointment: Follow up in one year for your annual wellness visit    Preventive Care 26 Years and Older, Female Preventive care refers to lifestyle choices and visits with your health care provider that can promote health and wellness. What does preventive care include? A yearly physical exam. This is also called an annual well check. Dental exams once or twice a year. Routine eye exams. Ask your health care provider how often you should have your eyes checked. Personal lifestyle choices, including: Daily care of your teeth and gums. Regular physical activity. Eating a healthy diet. Avoiding tobacco and drug use. Limiting alcohol use. Practicing safe sex. Taking low-dose aspirin every day. Taking vitamin and mineral supplements as recommended by your health care provider. What happens during an annual well check? The services and screenings done by your health care provider during your annual well check will depend on your age, overall health, lifestyle risk factors, and family history of disease. Counseling  Your health care provider may ask you questions about your: Alcohol use. Tobacco use. Drug use. Emotional  well-being. Home and relationship well-being. Sexual activity. Eating habits. History of falls. Memory and ability to understand (cognition). Work and work Statistician. Reproductive health. Screening  You may have the following tests or measurements: Height, weight, and BMI. Blood pressure. Lipid and cholesterol levels. These may be checked every 5 years, or more frequently if you are over 85 years old. Skin check. Lung cancer screening. You may have this screening every year starting at age 80 if you have a 30-pack-year history of smoking and currently smoke or have quit within the past 15 years. Fecal occult blood test (FOBT) of the stool. You may have this test every year starting at age 37. Flexible sigmoidoscopy or colonoscopy. You may have a sigmoidoscopy every 5 years or a colonoscopy every 10 years starting at age 80. Hepatitis C blood test. Hepatitis B blood test. Sexually transmitted disease (STD) testing. Diabetes screening. This is done by checking your blood sugar (glucose) after you have not eaten for a while (fasting). You may have this done every 1-3 years. Bone density scan. This is done to screen for osteoporosis. You may have this done starting at age 66. Mammogram. This may be done every 1-2 years. Talk to your health care provider about how often you should have regular mammograms. Talk with your health care provider about your test results, treatment options, and if necessary, the need for more tests. Vaccines  Your health care provider may recommend certain vaccines, such as: Influenza vaccine. This is recommended every year. Tetanus, diphtheria, and acellular pertussis (Tdap, Td) vaccine. You may need a Td booster every 10 years. Zoster vaccine. You may need this after age 85. Pneumococcal 13-valent conjugate (PCV13) vaccine. One dose is recommended after age 52. Pneumococcal polysaccharide (PPSV23) vaccine. One dose is recommended after age 47. Talk to  your  health care provider about which screenings and vaccines you need and how often you need them. This information is not intended to replace advice given to you by your health care provider. Make sure you discuss any questions you have with your health care provider. Document Released: 09/21/2015 Document Revised: 05/14/2016 Document Reviewed: 06/26/2015 Elsevier Interactive Patient Education  2017 [place] Prevention in the Home Falls can cause injuries. They can happen to people of all ages. There are many things you can do to make your home safe and to help prevent falls. What can I do on the outside of my home? Regularly fix the edges of walkways and driveways and fix any cracks. Remove anything that might make you trip as you walk through a door, such as a raised step or threshold. Trim any bushes or trees on the path to your home. Use bright outdoor lighting. Clear any walking paths of anything that might make someone trip, such as rocks or tools. Regularly check to see if handrails are loose or broken. Make sure that both sides of any steps have handrails. Any raised decks and porches should have guardrails on the edges. Have any leaves, snow, or ice cleared regularly. Use sand or salt on walking paths during winter. Clean up any spills in your garage right away. This includes oil or grease spills. What can I do in the bathroom? Use night lights. Install grab bars by the toilet and in the tub and shower. Do not use towel bars as grab bars. Use non-skid mats or decals in the tub or shower. If you need to sit down in the shower, use a plastic, non-slip stool. Keep the floor dry. Clean up any water that spills on the floor as soon as it happens. Remove soap buildup in the tub or shower regularly. Attach bath mats securely with double-sided non-slip rug tape. Do not have throw rugs and other things on the floor that can make you trip. What can I do in the bedroom? Use night  lights. Make sure that you have a light by your bed that is easy to reach. Do not use any sheets or blankets that are too big for your bed. They should not hang down onto the floor. Have a firm chair that has side arms. You can use this for support while you get dressed. Do not have throw rugs and other things on the floor that can make you trip. What can I do in the kitchen? Clean up any spills right away. Avoid walking on wet floors. Keep items that you use a lot in easy-to-reach places. If you need to reach something above you, use a strong step stool that has a grab bar. Keep electrical cords out of the way. Do not use floor polish or wax that makes floors slippery. If you must use wax, use non-skid floor wax. Do not have throw rugs and other things on the floor that can make you trip. What can I do with my stairs? Do not leave any items on the stairs. Make sure that there are handrails on both sides of the stairs and use them. Fix handrails that are broken or loose. Make sure that handrails are as long as the stairways. Check any carpeting to make sure that it is firmly attached to the stairs. Fix any carpet that is loose or worn. Avoid having throw rugs at the top or bottom of the stairs. If you do have throw rugs, attach  them to the floor with carpet tape. Make sure that you have a light switch at the top of the stairs and the bottom of the stairs. If you do not have them, ask someone to add them for you. What else can I do to help prevent falls? Wear shoes that: Do not have high heels. Have rubber bottoms. Are comfortable and fit you well. Are closed at the toe. Do not wear sandals. If you use a stepladder: Make sure that it is fully opened. Do not climb a closed stepladder. Make sure that both sides of the stepladder are locked into place. Ask someone to hold it for you, if possible. Clearly mark and make sure that you can see: Any grab bars or handrails. First and last  steps. Where the edge of each step is. Use tools that help you move around (mobility aids) if they are needed. These include: Canes. Walkers. Scooters. Crutches. Turn on the lights when you go into a dark area. Replace any light bulbs as soon as they burn out. Set up your furniture so you have a clear path. Avoid moving your furniture around. If any of your floors are uneven, fix them. If there are any pets around you, be aware of where they are. Review your medicines with your doctor. Some medicines can make you feel dizzy. This can increase your chance of falling. Ask your doctor what other things that you can do to help prevent falls. This information is not intended to replace advice given to you by your health care provider. Make sure you discuss any questions you have with your health care provider. Document Released: 06/21/2009 Document Revised: 01/31/2016 Document Reviewed: 09/29/2014 Elsevier Interactive Patient Education  2017 [employer].

## 2022-04-11 NOTE — Progress Notes (Signed)
I connected with Anastasia Tompson today by telephone and verified that I am speaking with the correct person using two identifiers. Location patient: home Location provider: work Persons participating in the virtual visit: Cambryn Charters, Spouse, Glenna Durand LPN.   I discussed the limitations, risks, security and privacy concerns of performing an evaluation and management service by telephone and the availability of in person appointments. I also discussed with the patient that there may be a patient responsible charge related to this service. The patient expressed understanding and verbally consented to this telephonic visit.    Interactive audio and video telecommunications were attempted between this provider and patient, however failed, due to patient having technical difficulties OR patient did not have access to video capability.  We continued and completed visit with audio only.     Vital signs may be patient reported or missing.  Subjective:   Linda Stevens is a 76 y.o. female who presents for Medicare Annual (Subsequent) preventive examination.  Review of Systems     Cardiac Risk Factors include: advanced age (>68mn, >>69women);hypertension     Objective:    Today's Vitals   04/11/22 1014  Weight: 135 lb (61.2 kg)  Height: 5' 2" (1.575 m)   Body mass index is 24.69 kg/m.     04/11/2022   10:18 AM 01/29/2022    6:42 PM 05/03/2021    4:26 PM 05/03/2021   10:05 AM 05/03/2021   10:03 AM 06/07/2020    7:05 AM 06/06/2020    5:00 PM  Advanced Directives  Does Patient Have a Medical Advance Directive? _0  Yes Yes  Type of [place] Healthcare Power of [place] will Living will;Healthcare Power of [place]Living will [place] will [place]Living will  Does patient want to make changes to  medical advance directive?   No - Patient declined No - Patient declined   No - Patient declined  Copy of HMount Eatonin Chart? Yes - validated most recent copy scanned in chart (See row information)  No - copy requested    Yes - validated most recent copy scanned in chart (See row information)    Current Medications (verified) Outpatient Encounter Medications as of 04/11/2022  Medication Sig   acetaminophen (TYLENOL) 500 MG tablet Take 500 mg by mouth every 4 (four) hours as needed for headache (pain).   albuterol (VENTOLIN HFA) 108 (90 Base) MCG/ACT inhaler INHALE 2 PUFFS INTO THE LUNGS EVERY 6 HOURS AS NEEDED FOR WHEEZING/SHORTNESS OF BREATH   ambrisentan (LETAIRIS) 10 MG tablet TAKE 1 TABLET BY MOUTH 1 TIME A DAY. DO NOT HANDLE IF PREGNANT.Please make appointment for further refills   aspirin EC 81 MG EC tablet Take 1 tablet (81 mg total) by mouth daily.   ferrous sulfate 325 (65 FE) MG EC tablet TAKE 1 TABLET BY MOUTH TWICE A DAY WITH MEALS   fluticasone (FLONASE) 50 MCG/ACT nasal spray Place 1 spray into both nostrils daily.   furosemide (LASIX) 40 MG tablet TAKE 1 TABLET BY MOUTH TWICE A DAY   hydrOXYzine (ATARAX) 50 MG tablet TAKE 1/2 TO 1 TABLET BY MOUTH AT BEDTIME FOR ANXIETY   KLOR-CON M20 20 MEQ tablet TAKE 1 TABLET BY MOUTH EVERY DAY   levothyroxine (SYNTHROID) 75 MCG tablet TAKE 1 TABLET BY MOUTH EVERY DAY   loratadine (CLARITIN) 10 MG tablet TAKE 1 TABLET BY MOUTH  EVERY DAY   OXYGEN Inhale 4 L into the lungs continuous.   pantoprazole (PROTONIX) 40 MG tablet Take 40 mg by mouth daily.   polyvinyl alcohol (LIQUIFILM TEARS) 1.4 % ophthalmic solution Place 1 drop into both eyes daily as needed for dry eyes.   spironolactone (ALDACTONE) 25 MG tablet TAKE 1/2 TABLET BY MOUTH EVERY DAY   tadalafil (CIALIS) 20 MG tablet Take 2 tablets (40 mg total) by mouth daily. Please schedule an appointment for further refills   No facility-administered encounter medications on file  as of 04/11/2022.    Allergies (verified) Amoxicillin-pot clavulanate, Cefdinir, Singulair [montelukast sodium], and Moxifloxacin   History: Past Medical History:  Diagnosis Date   Acute respiratory failure with hypoxia and hypercapnea 02/26/2014   Allergic rhinitis due to pollen    Allergy    Anemia 06/12/2020   Arthritis    Asthma    CHF (congestive heart failure) (HCC)    COPD (chronic obstructive pulmonary disease) ([place])    Diastolic dysfunction    a. 02/2014 Echo: EF 65-70%, Gr 1 DD, RVH;  b. 09/2015 Echo: EF 55%, Gr 1 DD.   Emphysema of lung (HCC)    H/O seasonal allergies    History of chicken pox    History of tobacco abuse    a. Quit 2015.   History of UTI    Hypertension    Hypertensive heart disease    Lower extremity edema    Pericardial effusion    a. 09/2015 Echo: Mod eff w/o tamponade.   Right heart failure with reduced right ventricular function ([place])    a. 09/2015 Echo: EF 55%, Gr 1 DD, D shaped IV septum, sev dil RV with mod reduced fxn, mod dil RA, RV-RA grad 50mHg, PASP 851mg, mod pericard eff w/o tamponade.   Severe Pulmonary Hypertension    a. 09/2015 Echo: PASP 8787m.   Past Surgical History:  Procedure Laterality Date   CARDIAC CATHETERIZATION N/A 10/08/2015   Procedure: Right/Left Heart Cath and Coronary Angiography;  Surgeon: DanJolaine ArtistD;  Location: [facility];  Service: Cardiovascular;  Laterality: N/A;   CHOLECYSTECTOMY     Open   COLONOSCOPY WITH PROPOFOL N/A 06/07/2020   Procedure: COLONOSCOPY WITH PROPOFOL;  Surgeon: PyrJerene BearsD;  [facility];  Laterality: N/A;   ESOPHAGOGASTRODUODENOSCOPY (EGD) WITH PROPOFOL N/A 06/07/2020   Procedure: ESOPHAGOGASTRODUODENOSCOPY (EGD) WITH PROPOFOL;  Surgeon: PyrJerene BearsD;  [facility];  Service: Gastroenterology;  Laterality: N/A;   POLYPECTOMY  06/07/2020   Procedure: POLYPECTOMY;  Surgeon: PyrJerene BearsD;  Location: [facility] Service: Gastroenterology;;   RIGHT HEART CATH N/A 01/26/2017   Procedure: Right Heart Cath;  Surgeon: BenJolaine ArtistD;  Location: [facility];  Service: Cardiovascular;  Laterality: N/A;   RIGHT HEART CATH N/A 08/12/2017   Procedure: RIGHT HEART CATH;  Surgeon: BenJolaine ArtistD;  Location: [facility];  Service: Cardiovascular;  Laterality: N/A;   Family History  Problem Relation Age of Onset   Glaucoma Brother    Vascular Disease Father        Died of complications from surgery   Heart attack Father    Asthma Mother        Died of asthma attack   Dementia Mother    Hyperlipidemia Mother    Hypertension Mother    Social History   Socioeconomic History   Marital status: Married  Spouse name: Not on file   Number of children: Not on file   Years of education: 2   Highest education level: Not on file  Occupational History   Occupation: retired Pharmacist, hospital  Tobacco Use   Smoking status: Former    Packs/day: 1.00    Years: 40.00    Total pack years: 40.00    Types: Cigarettes    Quit date: 02/22/2014    Years since quitting: 8.1   Smokeless tobacco: Never  Vaping Use   Vaping Use: Never used  Substance and Sexual Activity   Alcohol use: No   Drug use: No   Sexual activity: Never  Other Topics Concern   Not on file  Social History Narrative   Married with 2 children.  Independent of ADLs.      Does not have a living will.   Would desire CPR but would not want prolonged life support if futile- husband aware.   Social Determinants of Health   Financial Resource Strain: Low Risk  (04/11/2022)   Overall Financial Resource Strain (CARDIA)    Difficulty of Paying Living Expenses: Not hard at all  Food Insecurity: No Food Insecurity (04/11/2022)   Hunger Vital Sign    Worried About Running Out of Food in the Last Year: Never true    Ran Out of Food in the Last Year: Never true  Transportation Needs: No Transportation Needs (04/11/2022)   PRAPARE -  Hydrologist (Medical): No    Lack of Transportation (Non-Medical): No  Physical Activity: Insufficiently Active (04/11/2022)   Exercise Vital Sign    Days of Exercise per Week: 5 days    Minutes of Exercise per Session: 20 min  Stress: No Stress Concern Present (04/11/2022)   [place]    Feeling of Stress : Not at all  Social Connections: Not on file    Tobacco Counseling Counseling given: Not Answered   Clinical Intake:  Pre-visit preparation completed: Yes  Pain : No/denies pain     Nutritional Status: BMI of 19-24  Normal Nutritional Risks: None Diabetes: No  How often do you need to have someone help you when you read instructions, pamphlets, or other written materials from your doctor or pharmacy?: 1 - Never  Diabetic? no  Interpreter Needed?: No  Information entered by :: NAllen LPN   Activities of Daily Living    04/11/2022   10:19 AM 05/03/2021    4:31 PM  In your present state of health, do you have any difficulty performing the following activities:  Hearing? 0   Vision? 0   Difficulty concentrating or making decisions? 0   Walking or climbing stairs? 0   Dressing or bathing? 1   Doing errands, shopping? 1 0  Preparing Food and eating ? Y   Using the Toilet? N   In the past six months, have you accidently leaked urine? N   Do you have problems with loss of bowel control? N   Managing your Medications? Y   Managing your Finances? Y   Housekeeping or managing your Housekeeping? Y     Patient Care Team: [name], MD as PCP - General (Family Medicine) Bensimhon, Shaune Pascal, MD as PCP - Cardiology (Cardiology) Tanda Rockers, MD as Consulting Physician (Pulmonary Disease) Rutherford Guys, MD as Consulting Physician (Ophthalmology) Charlton Haws, [facility] as Pharmacist (Pharmacist)  Indicate any recent Medical Services you may have received from  other  than [facility] providers in the past year (date may be approximate).     Assessment:   This is a routine wellness examination for [place].  Hearing/Vision screen Vision Screening - Comments:: No regular eye exams,   Dietary issues and exercise activities discussed: Current Exercise Habits: Home exercise routine, Type of exercise: stretching, Time (Minutes): 20, Frequency (Times/Week): 5, Weekly Exercise (Minutes/Week): 100   Goals Addressed             This Visit's Progress    Patient Stated       04/11/2022, no goals       Depression Screen    04/11/2022   10:19 AM 07/19/2018    2:48 PM  PHQ 2/9 Scores  PHQ - 2 Score 0 0  PHQ- 9 Score  0    Fall Risk    04/11/2022   10:18 AM 07/19/2018    2:48 PM  [place] in the past year? 0 0  Number falls in past yr: 0   Injury with Fall? 1   Risk for fall due to : Impaired balance/gait;Medication side effect   Follow up Falls evaluation completed;Education provided;Falls prevention discussed     FALL RISK PREVENTION PERTAINING TO THE HOME:  Any stairs in or around the home? No  If so, are there any without handrails? N/a Home free of loose throw rugs in walkways, pet beds, electrical cords, etc? Yes  Adequate lighting in your home to reduce risk of falls? Yes   ASSISTIVE DEVICES UTILIZED TO PREVENT FALLS:  Life alert? No  Use of a cane, walker or w/c? No  Grab bars in the bathroom? Yes  Shower chair or bench in shower? Yes  Elevated toilet seat or a handicapped toilet? Yes   TIMED UP AND GO:  Was the test performed? No .     Cognitive Function:    07/19/2018    2:48 PM  MMSE - Mini Mental State Exam  Orientation to time 5  Orientation to Place 5  Registration 3  Attention/ Calculation 0  Recall 3  Language- name 2 objects 0  Language- repeat 1  Language- follow 3 step command 3  Language- read & follow direction 0  Write a sentence 0  Copy design 0  Total score 20         Immunizations Immunization History  Administered Date(s) Administered   Fluad Quad(high Dose 65+) 06/19/2020   Influenza,inj,Quad PF,6+ Mos 05/03/2014, 08/21/2015, 07/08/2016, 06/05/2017, 07/19/2018, 07/14/2019   PFIZER(Purple Top)SARS-COV-2 Vaccination 10/15/2019, 11/07/2019   Pneumococcal Conjugate-13 01/17/2016   Pneumococcal Polysaccharide-23 04/19/2014   Td 07/23/2018    TDAP status: Up to date  Flu Vaccine status: Due, Education has been provided regarding the importance of this vaccine. Advised may receive this vaccine at local pharmacy or Health Dept. Aware to provide a copy of the vaccination record if obtained from local pharmacy or Health Dept. Verbalized acceptance and understanding.  Pneumococcal vaccine status: Up to date  Covid-19 vaccine status: Completed vaccines  Qualifies for Shingles Vaccine? Yes   Zostavax completed No   Shingrix Completed?: No.    Education has been provided regarding the importance of this vaccine. Patient has been advised to call insurance company to determine out of pocket expense if they have not yet received this vaccine. Advised may also receive vaccine at local pharmacy or Health Dept. Verbalized acceptance and understanding.  Screening Tests Health Maintenance  Topic Date Due   Zoster Vaccines- Shingrix (1  of 2) Never done   MAMMOGRAM  05/09/2017   COVID-19 Vaccine (3 - Pfizer series) 01/02/2020   COLON CANCER SCREENING ANNUAL FOBT  06/07/2021   INFLUENZA VACCINE  04/08/2022   TETANUS/TDAP  07/23/2028   COLONOSCOPY (Pts 45-38yr Insurance coverage will need to be confirmed)  06/07/2030   Pneumonia Vaccine 74 Years old  Completed   DEXA SCAN  Completed   Hepatitis C Screening  Completed   HPV VACCINES  Aged Out    Health Maintenance  Health Maintenance Due  Topic Date Due   Zoster Vaccines- Shingrix (1 of 2) Never done   MAMMOGRAM  05/09/2017   COVID-19 Vaccine (3 - Pfizer series) 01/02/2020   COLON CANCER SCREENING  ANNUAL FOBT  06/07/2021   INFLUENZA VACCINE  04/08/2022    Colorectal cancer screening: No longer required.   Mammogram status: decline  Bone Density status: Completed 05/08/2014.   Lung Cancer Screening: (Low Dose CT Chest recommended if Age 74-80years, 30 pack-year currently smoking OR have quit w/in 15years.) does not qualify.   Lung Cancer Screening Referral: no  Additional Screening:  Hepatitis C Screening: does qualify; Completed 10/15/2015  Vision Screening: Recommended annual ophthalmology exams for early detection of glaucoma and other disorders of the eye. Is the patient up to date with their annual eye exam?  No  Who is the provider or what is the name of the office in which the patient attends annual eye exams? none If pt is not established with a provider, would they like to be referred to a provider to establish care? No .   Dental Screening: Recommended annual dental exams for proper oral hygiene  Community Resource Referral / Chronic Care Management: CRR required this visit?  No   CCM required this visit?  No      Plan:     I have personally reviewed and noted the following in the patient's chart:   Medical and social history Use of alcohol, tobacco or illicit drugs  Current medications and supplements including opioid prescriptions.  Functional ability and status Nutritional status Physical activity Advanced directives List of other physicians Hospitalizations, surgeries, and ER visits in previous 12 months Vitals Screenings to include cognitive, depression, and falls Referrals and appointments  In addition, I have reviewed and discussed with patient certain preventive protocols, quality metrics, and best practice recommendations. A written personalized care plan for preventive services as well as general preventive health recommendations were provided to patient.     NKellie Simmering LPN   84/04/[id number]  Nurse Notes: 6 CIT not administered.  Declined  Due to this being a virtual visit, the after visit summary with patients personalized plan was offered to patient via mail or my-chart.  Patient would like to access on my-chart

## 2022-04-30 ENCOUNTER — Other Ambulatory Visit: Payer: Self-pay | Admitting: Family Medicine

## 2022-04-30 NOTE — Telephone Encounter (Signed)
Last office visit 01/10/22 for SOB/Weakness.  Last refilled 10/15/21 for #90 with 1 refill.  No future appointments with PCP.

## 2022-05-08 DIAGNOSIS — J449 Chronic obstructive pulmonary disease, unspecified: Secondary | ICD-10-CM | POA: Diagnosis not present

## 2022-05-08 DIAGNOSIS — J45909 Unspecified asthma, uncomplicated: Secondary | ICD-10-CM | POA: Diagnosis not present

## 2022-05-08 DIAGNOSIS — R062 Wheezing: Secondary | ICD-10-CM | POA: Diagnosis not present

## 2022-05-08 DIAGNOSIS — J9601 Acute respiratory failure with hypoxia: Secondary | ICD-10-CM | POA: Diagnosis not present

## 2022-05-28 ENCOUNTER — Telehealth: Payer: Self-pay

## 2022-05-28 NOTE — Progress Notes (Unsigned)
    Chronic Care Management Pharmacy Assistant   Name: Linda Stevens  MRN: [id number] DOB: 1945/11/05  Reason for Encounter: CCM (Appointment Reminder)  Medications: Outpatient Encounter Medications as of 05/28/2022  Medication Sig   acetaminophen (TYLENOL) 500 MG tablet Take 500 mg by mouth every 4 (four) hours as needed for headache (pain).   albuterol (VENTOLIN HFA) 108 (90 Base) MCG/ACT inhaler INHALE 2 PUFFS INTO THE LUNGS EVERY 6 HOURS AS NEEDED FOR WHEEZING/SHORTNESS OF BREATH   ambrisentan (LETAIRIS) 10 MG tablet TAKE 1 TABLET BY MOUTH 1 TIME A DAY. DO NOT HANDLE IF PREGNANT.Please make appointment for further refills   aspirin EC 81 MG EC tablet Take 1 tablet (81 mg total) by mouth daily.   ferrous sulfate 325 (65 FE) MG EC tablet TAKE 1 TABLET BY MOUTH TWICE A DAY WITH MEALS   fluticasone (FLONASE) 50 MCG/ACT nasal spray Place 1 spray into both nostrils daily.   furosemide (LASIX) 40 MG tablet TAKE 1 TABLET BY MOUTH TWICE A DAY   hydrOXYzine (ATARAX) 50 MG tablet TAKE 1/2 TO 1 TABLET BY MOUTH AT BEDTIME FOR ANXIETY   KLOR-CON M20 20 MEQ tablet TAKE 1 TABLET BY MOUTH EVERY DAY   levothyroxine (SYNTHROID) 75 MCG tablet TAKE 1 TABLET BY MOUTH EVERY DAY   loratadine (CLARITIN) 10 MG tablet TAKE 1 TABLET BY MOUTH EVERY DAY   OXYGEN Inhale 4 L into the lungs continuous.   pantoprazole (PROTONIX) 40 MG tablet Take 40 mg by mouth daily.   polyvinyl alcohol (LIQUIFILM TEARS) 1.4 % ophthalmic solution Place 1 drop into both eyes daily as needed for dry eyes.   spironolactone (ALDACTONE) 25 MG tablet TAKE 1/2 TABLET BY MOUTH EVERY DAY   tadalafil (CIALIS) 20 MG tablet Take 2 tablets (40 mg total) by mouth daily. Please schedule an appointment for further refills   No facility-administered encounter medications on file as of 05/28/2022.   Linda Stevens was contacted to remind of upcoming telephone visit with Charlene Brooke on 06/03/2022 at 8:45. Patient was reminded to have  any blood glucose and blood pressure readings available for review at appointment.   {Appointment confirmation:27274}  Are you having any problems with your medications? {yes/no:20286}   Do you have any concerns you like to discuss with the pharmacist? {yes/no:20286}  CCM referral has been placed prior to visit?  Yes   Star Rating Drugs: Medication:  Last Fill: Day Supply No star rating drugs  Charlene Brooke, CPP notified  [name], [place] Clinical Pharmacy Assistant [phone]

## 2022-05-31 ENCOUNTER — Other Ambulatory Visit: Payer: Self-pay | Admitting: Family Medicine

## 2022-05-31 DIAGNOSIS — E039 Hypothyroidism, unspecified: Secondary | ICD-10-CM

## 2022-06-01 NOTE — Telephone Encounter (Signed)
Please schedule CPE with fasting labs prior with Dr Diona Browner.  Already had [place] with nurse.   Ketoconazole cream not on current medication list.  Refill?

## 2022-06-02 ENCOUNTER — Inpatient Hospital Stay (HOSPITAL_COMMUNITY)
Admission: EM | Admit: 2022-06-02 | Discharge: 2022-06-07 | DRG: 291 | Disposition: A | Discharge status: Home Health | Payer: Medicare HMO | Attending: Internal Medicine | Admitting: Internal Medicine

## 2022-06-02 ENCOUNTER — Encounter (HOSPITAL_COMMUNITY): Payer: Self-pay

## 2022-06-02 ENCOUNTER — Other Ambulatory Visit: Payer: Self-pay

## 2022-06-02 ENCOUNTER — Other Ambulatory Visit (INDEPENDENT_AMBULATORY_CARE_PROVIDER_SITE_OTHER): Payer: Medicare HMO

## 2022-06-02 ENCOUNTER — Telehealth: Payer: Self-pay

## 2022-06-02 ENCOUNTER — Telehealth: Payer: Self-pay | Admitting: Family Medicine

## 2022-06-02 ENCOUNTER — Emergency Department (HOSPITAL_COMMUNITY): Payer: Medicare HMO

## 2022-06-02 DIAGNOSIS — J9622 Acute and chronic respiratory failure with hypercapnia: Secondary | ICD-10-CM | POA: Diagnosis not present

## 2022-06-02 DIAGNOSIS — N179 Acute kidney failure, unspecified: Secondary | ICD-10-CM | POA: Diagnosis not present

## 2022-06-02 DIAGNOSIS — I130 Hypertensive heart and chronic kidney disease with heart failure and stage 1 through stage 4 chronic kidney disease, or unspecified chronic kidney disease: Principal | ICD-10-CM | POA: Diagnosis present

## 2022-06-02 DIAGNOSIS — J90 Pleural effusion, not elsewhere classified: Secondary | ICD-10-CM | POA: Diagnosis not present

## 2022-06-02 DIAGNOSIS — F419 Anxiety disorder, unspecified: Secondary | ICD-10-CM | POA: Diagnosis present

## 2022-06-02 DIAGNOSIS — Z7982 Long term (current) use of aspirin: Secondary | ICD-10-CM | POA: Diagnosis not present

## 2022-06-02 DIAGNOSIS — Z79899 Other long term (current) drug therapy: Secondary | ICD-10-CM | POA: Diagnosis not present

## 2022-06-02 DIAGNOSIS — Z743 Need for continuous supervision: Secondary | ICD-10-CM | POA: Diagnosis not present

## 2022-06-02 DIAGNOSIS — I2729 Other secondary pulmonary hypertension: Secondary | ICD-10-CM | POA: Diagnosis not present

## 2022-06-02 DIAGNOSIS — I10 Essential (primary) hypertension: Secondary | ICD-10-CM | POA: Diagnosis not present

## 2022-06-02 DIAGNOSIS — Z83438 Family history of other disorder of lipoprotein metabolism and other lipidemia: Secondary | ICD-10-CM | POA: Diagnosis not present

## 2022-06-02 DIAGNOSIS — E039 Hypothyroidism, unspecified: Secondary | ICD-10-CM | POA: Diagnosis present

## 2022-06-02 DIAGNOSIS — Z9981 Dependence on supplemental oxygen: Secondary | ICD-10-CM | POA: Diagnosis not present

## 2022-06-02 DIAGNOSIS — D509 Iron deficiency anemia, unspecified: Secondary | ICD-10-CM | POA: Diagnosis not present

## 2022-06-02 DIAGNOSIS — I272 Pulmonary hypertension, unspecified: Secondary | ICD-10-CM | POA: Diagnosis not present

## 2022-06-02 DIAGNOSIS — R0602 Shortness of breath: Secondary | ICD-10-CM | POA: Diagnosis not present

## 2022-06-02 DIAGNOSIS — K761 Chronic passive congestion of liver: Secondary | ICD-10-CM | POA: Diagnosis present

## 2022-06-02 DIAGNOSIS — I5082 Biventricular heart failure: Secondary | ICD-10-CM | POA: Diagnosis not present

## 2022-06-02 DIAGNOSIS — J439 Emphysema, unspecified: Secondary | ICD-10-CM | POA: Diagnosis not present

## 2022-06-02 DIAGNOSIS — Z87891 Personal history of nicotine dependence: Secondary | ICD-10-CM | POA: Diagnosis not present

## 2022-06-02 DIAGNOSIS — D6959 Other secondary thrombocytopenia: Secondary | ICD-10-CM | POA: Diagnosis not present

## 2022-06-02 DIAGNOSIS — I959 Hypotension, unspecified: Secondary | ICD-10-CM | POA: Diagnosis not present

## 2022-06-02 DIAGNOSIS — J9621 Acute and chronic respiratory failure with hypoxia: Secondary | ICD-10-CM | POA: Diagnosis present

## 2022-06-02 DIAGNOSIS — N1832 Chronic kidney disease, stage 3b: Secondary | ICD-10-CM | POA: Diagnosis present

## 2022-06-02 DIAGNOSIS — Z79890 Hormone replacement therapy (postmenopausal): Secondary | ICD-10-CM

## 2022-06-02 DIAGNOSIS — I2510 Atherosclerotic heart disease of native coronary artery without angina pectoris: Secondary | ICD-10-CM | POA: Diagnosis not present

## 2022-06-02 DIAGNOSIS — D649 Anemia, unspecified: Secondary | ICD-10-CM | POA: Diagnosis not present

## 2022-06-02 DIAGNOSIS — Z825 Family history of asthma and other chronic lower respiratory diseases: Secondary | ICD-10-CM | POA: Diagnosis not present

## 2022-06-02 DIAGNOSIS — I110 Hypertensive heart disease with heart failure: Secondary | ICD-10-CM | POA: Diagnosis not present

## 2022-06-02 DIAGNOSIS — K7460 Unspecified cirrhosis of liver: Secondary | ICD-10-CM | POA: Diagnosis not present

## 2022-06-02 DIAGNOSIS — R0609 Other forms of dyspnea: Secondary | ICD-10-CM | POA: Diagnosis not present

## 2022-06-02 DIAGNOSIS — I480 Paroxysmal atrial fibrillation: Secondary | ICD-10-CM | POA: Diagnosis present

## 2022-06-02 DIAGNOSIS — Z8249 Family history of ischemic heart disease and other diseases of the circulatory system: Secondary | ICD-10-CM | POA: Diagnosis not present

## 2022-06-02 DIAGNOSIS — R7989 Other specified abnormal findings of blood chemistry: Secondary | ICD-10-CM | POA: Diagnosis not present

## 2022-06-02 DIAGNOSIS — J984 Other disorders of lung: Secondary | ICD-10-CM | POA: Diagnosis present

## 2022-06-02 DIAGNOSIS — I5033 Acute on chronic diastolic (congestive) heart failure: Secondary | ICD-10-CM | POA: Diagnosis present

## 2022-06-02 DIAGNOSIS — I5031 Acute diastolic (congestive) heart failure: Secondary | ICD-10-CM | POA: Diagnosis not present

## 2022-06-02 DIAGNOSIS — I50810 Right heart failure, unspecified: Secondary | ICD-10-CM | POA: Diagnosis not present

## 2022-06-02 DIAGNOSIS — Z66 Do not resuscitate: Secondary | ICD-10-CM | POA: Diagnosis present

## 2022-06-02 DIAGNOSIS — J441 Chronic obstructive pulmonary disease with (acute) exacerbation: Secondary | ICD-10-CM | POA: Diagnosis present

## 2022-06-02 DIAGNOSIS — N3944 Nocturnal enuresis: Secondary | ICD-10-CM | POA: Diagnosis present

## 2022-06-02 LAB — TSH: TSH: 4.36 u[IU]/mL (ref 0.35–5.50)

## 2022-06-02 LAB — I-STAT VENOUS BLOOD GAS, ED
Acid-Base Excess: 14 mmol/L — ABNORMAL HIGH (ref 0.0–2.0)
Bicarbonate: 40.6 mmol/L — ABNORMAL HIGH (ref 20.0–28.0)
Calcium, Ion: 1.12 mmol/L — ABNORMAL LOW (ref 1.15–1.40)
HCT: 30 % — ABNORMAL LOW (ref 36.0–46.0)
Hemoglobin: 10.2 g/dL — ABNORMAL LOW (ref 12.0–15.0)
O2 Saturation: 90 %
Potassium: 4.2 mmol/L (ref 3.5–5.1)
Sodium: 142 mmol/L (ref 135–145)
TCO2: 42 mmol/L — ABNORMAL HIGH (ref 22–32)
pCO2, Ven: 61.2 mmHg — ABNORMAL HIGH (ref 44–60)
pH, Ven: 7.429 (ref 7.25–7.43)
pO2, Ven: 60 mmHg — ABNORMAL HIGH (ref 32–45)

## 2022-06-02 LAB — COMPREHENSIVE METABOLIC PANEL
ALT: 10 U/L (ref 0–35)
AST: 18 U/L (ref 0–37)
Albumin: 3.6 g/dL (ref 3.5–5.2)
Alkaline Phosphatase: 85 U/L (ref 39–117)
BUN: 24 mg/dL — ABNORMAL HIGH (ref 6–23)
CO2: 41 mEq/L — ABNORMAL HIGH (ref 19–32)
Calcium: 9.1 mg/dL (ref 8.4–10.5)
Chloride: 97 mEq/L (ref 96–112)
Creatinine, Ser: 1.46 mg/dL — ABNORMAL HIGH (ref 0.40–1.20)
GFR: 35.34 mL/min — ABNORMAL LOW (ref >60.00)
Glucose, Bld: 97 mg/dL (ref 70–99)
Potassium: 4.3 mEq/L (ref 3.5–5.1)
Sodium: 145 mEq/L (ref 135–145)
Total Bilirubin: 0.6 mg/dL (ref 0.2–1.2)
Total Protein: 6.6 g/dL (ref 6.0–8.3)

## 2022-06-02 LAB — LIPID PANEL
Cholesterol: 152 mg/dL (ref 0–200)
HDL: 71.4 mg/dL (ref >39.00)
LDL Cholesterol: 63 mg/dL (ref 0–99)
NonHDL: 80.51
Total CHOL/HDL Ratio: 2
Triglycerides: 88 mg/dL (ref 0.0–149.0)
VLDL: 17.6 mg/dL (ref 0.0–40.0)

## 2022-06-02 LAB — CBC WITH DIFFERENTIAL/PLATELET
Basophils Absolute: 0 10*3/uL (ref 0.0–0.1)
Basophils Relative: 0.8 % (ref 0.0–3.0)
Eosinophils Absolute: 0.3 10*3/uL (ref 0.0–0.7)
Eosinophils Relative: 7 % — ABNORMAL HIGH (ref 0.0–5.0)
HCT: 27.2 % — ABNORMAL LOW (ref 36.0–46.0)
Hemoglobin: 9 g/dL — ABNORMAL LOW (ref 12.0–15.0)
Lymphocytes Relative: 10.7 % — ABNORMAL LOW (ref 12.0–46.0)
Lymphs Abs: 0.4 10*3/uL — ABNORMAL LOW (ref 0.7–4.0)
MCHC: 33 g/dL (ref 30.0–36.0)
MCV: 99.8 fl (ref 78.0–100.0)
Monocytes Absolute: 0.6 10*3/uL (ref 0.1–1.0)
Monocytes Relative: 14.2 % — ABNORMAL HIGH (ref 3.0–12.0)
Neutro Abs: 2.6 10*3/uL (ref 1.4–7.7)
Neutrophils Relative %: 67.3 % (ref 43.0–77.0)
Platelets: 117 10*3/uL — ABNORMAL LOW (ref 150.0–400.0)
RBC: 2.73 Mil/uL — ABNORMAL LOW (ref 3.87–5.11)
RDW: 18.1 % — ABNORMAL HIGH (ref 11.5–15.5)
WBC: 3.9 10*3/uL — ABNORMAL LOW (ref 4.0–10.5)

## 2022-06-02 LAB — LACTIC ACID, PLASMA: Lactic Acid, Venous: 1 mmol/L (ref 0.5–1.9)

## 2022-06-02 LAB — IBC + FERRITIN
Ferritin: 32.1 ng/mL (ref 10.0–291.0)
Iron: 68 ug/dL (ref 42–145)
Saturation Ratios: 24.3 % (ref 20.0–50.0)
TIBC: 280 ug/dL (ref 250.0–450.0)
Transferrin: 200 mg/dL — ABNORMAL LOW (ref 212.0–360.0)

## 2022-06-02 LAB — D-DIMER, QUANTITATIVE: D-Dimer, Quant: 2.69 ug/mL-FEU — ABNORMAL HIGH (ref 0.00–0.50)

## 2022-06-02 LAB — MAGNESIUM: Magnesium: 2.1 mg/dL (ref 1.7–2.4)

## 2022-06-02 LAB — T3, FREE: T3, Free: 2.9 pg/mL (ref 2.3–4.2)

## 2022-06-02 LAB — T4, FREE: Free T4: 1.36 ng/dL (ref 0.60–1.60)

## 2022-06-02 LAB — BRAIN NATRIURETIC PEPTIDE: B Natriuretic Peptide: 1663.8 pg/mL — ABNORMAL HIGH (ref 0.0–100.0)

## 2022-06-02 MED ORDER — IPRATROPIUM-ALBUTEROL 0.5-2.5 (3) MG/3ML IN SOLN
3.0000 mL | Freq: Once | RESPIRATORY_TRACT | Status: AC
  Administered 2022-06-02: 3 mL via RESPIRATORY_TRACT

## 2022-06-02 MED ORDER — IPRATROPIUM-ALBUTEROL 0.5-2.5 (3) MG/3ML IN SOLN
3.0000 mL | RESPIRATORY_TRACT | Status: AC
  Filled 2022-06-02: qty 3

## 2022-06-02 MED ORDER — FUROSEMIDE 10 MG/ML IJ SOLN
40.0000 mg | Freq: Once | INTRAMUSCULAR | Status: AC
  Administered 2022-06-02: 40 mg via INTRAVENOUS
  Filled 2022-06-02: qty 4

## 2022-06-02 MED ORDER — METHYLPREDNISOLONE SODIUM SUCC 125 MG IJ SOLR
125.0000 mg | Freq: Once | INTRAMUSCULAR | Status: AC
  Administered 2022-06-02: 125 mg via INTRAVENOUS
  Filled 2022-06-02: qty 2

## 2022-06-02 MED ORDER — IPRATROPIUM-ALBUTEROL 0.5-2.5 (3) MG/3ML IN SOLN
3.0000 mL | Freq: Once | RESPIRATORY_TRACT | Status: AC
  Administered 2022-06-02: 3 mL via RESPIRATORY_TRACT
  Filled 2022-06-02: qty 3

## 2022-06-02 NOTE — Telephone Encounter (Signed)
I agree with POC.  With 91% of 6L of 02, she needs high level of care with ability to watch over time such as in the ER.

## 2022-06-02 NOTE — ED Provider Notes (Signed)
[facility] EMERGENCY DEPARTMENT Provider Note   CSN: [id number] Arrival date & time: 06/02/22  1520     History  Chief Complaint  Patient presents with   Shortness of Breath    Linda Stevens is a 76 y.o. female with COPD, CHF, cirrhosis, chronic respiratory failure with hypoxia and hypercapnia.,  Palliative care by specialist with DNR, GI bleed secondary to colonic AVMs, hypothyroidism presents with SOB requiring 6L [place].  Accompanied by daughter and husband who provide additional history.   Several weeks of worsening SOB. Endorses cough w/ secretions but can't cough them up. Worse with lying down. Has been using her albuterol a lot over the last few weeks which she does think helps some. Denies f/c, CP, palpitations, N/V. Has h/o both COPD and CHF which patient notes have been worsening. Husband has had patient on 6L [place] at home 24/7 to help with her increased WOB. +poor PO intake and decreased activity, decreased movement, generalized weakness.   HPI     Home Medications Prior to Admission medications   Medication Sig Start Date End Date Taking? Authorizing Provider  acetaminophen (TYLENOL) 500 MG tablet Take 500 mg by mouth every 4 (four) hours as needed for headache (pain).    [provider]  albuterol (VENTOLIN HFA) 108 (90 Base) MCG/ACT inhaler INHALE 2 PUFFS INTO THE LUNGS EVERY 6 HOURS AS NEEDED FOR WHEEZING/SHORTNESS OF BREATH 03/02/22   Bedsole, Amy E, MD  ambrisentan (LETAIRIS) 10 MG tablet TAKE 1 TABLET BY MOUTH 1 TIME A DAY. DO NOT HANDLE IF PREGNANT.Please make appointment for further refills 03/07/22   Bensimhon, Shaune Pascal, MD  aspirin EC 81 MG EC tablet Take 1 tablet (81 mg total) by mouth daily. 10/09/15   Debbe Odea, MD  ferrous sulfate 325 (65 FE) MG EC tablet TAKE 1 TABLET BY MOUTH TWICE A DAY WITH MEALS 01/09/22   Bedsole, Amy E, MD  fluticasone (FLONASE) 50 MCG/ACT nasal spray Place 1 spray into both nostrils daily. 08/19/21   Cobb,  Karie Schwalbe, NP  furosemide (LASIX) 40 MG tablet TAKE 1 TABLET BY MOUTH TWICE A DAY 12/26/21   Bensimhon, Shaune Pascal, MD  hydrOXYzine (ATARAX) 50 MG tablet TAKE 1/2 TO 1 TABLET BY MOUTH AT BEDTIME FOR ANXIETY 04/30/22   Bedsole, Amy E, MD  levothyroxine (SYNTHROID) 75 MCG tablet TAKE 1 TABLET BY MOUTH EVERY DAY 06/01/22   Bedsole, Amy E, MD  loratadine (CLARITIN) 10 MG tablet TAKE 1 TABLET BY MOUTH EVERY DAY 04/10/22   Cobb, Karie Schwalbe, NP  OXYGEN Inhale 4 L into the lungs continuous.    [provider]  pantoprazole (PROTONIX) 40 MG tablet Take 40 mg by mouth daily.    [provider]  polyvinyl alcohol (LIQUIFILM TEARS) 1.4 % ophthalmic solution Place 1 drop into both eyes daily as needed for dry eyes.    [provider]  potassium chloride SA (KLOR-CON M20) 20 MEQ tablet TAKE 1 TABLET BY MOUTH EVERY DAY 06/01/22   Bedsole, Amy E, MD  spironolactone (ALDACTONE) 25 MG tablet TAKE 1/2 TABLET BY MOUTH EVERY DAY 11/28/21   Bensimhon, Shaune Pascal, MD  tadalafil (CIALIS) 20 MG tablet Take 2 tablets (40 mg total) by mouth daily. Please schedule an appointment for further refills 03/07/22   Bensimhon, Shaune Pascal, MD      Allergies    Amoxicillin-pot clavulanate, Cefdinir, Singulair [montelukast sodium], and Moxifloxacin    Review of Systems   Review of Systems Review of systems  negative for f/c.  A 10 point review of systems was performed and is negative unless otherwise reported in HPI.  Physical Exam Updated Vital Signs BP (!) 118/55   Pulse 79   Resp (!) 25   Ht _0  (1.575 m)   Wt 61.2 kg   SpO2 96%   BMI 24.68 kg/m  Physical Exam General: chronically ill-appearing female, lying in bed.  HEENT: PERRLA, Sclera anicteric, Dry mucous membranes, trachea midline. Cardiology: RRR, no murmurs/rubs/gallops. BL radial and DP pulses equal bilaterally.  Resp: Tachypneic with slightly increased WOB. On 5L [place]. Poor air movement bilaterally. Mild crackles. No wheezes.  Abd: Soft,  non-tender, non-distended. No rebound tenderness or guarding.  GU: Deferred. MSK: No peripheral edema or signs of trauma. Extremities without deformity or TTP. No cyanosis or clubbing. Skin: warm, dry. Psoriasis lesions on RLE.  Neuro: A&Ox4, CNs II-XII grossly intact. MAEs. Sensation grossly intact.  Psych: Normal mood and affect.   ED Results / Procedures / Treatments   Labs (all labs ordered are listed, but only abnormal results are displayed) Labs Reviewed  CBC WITH DIFFERENTIAL/PLATELET  BRAIN NATRIURETIC PEPTIDE  LACTIC ACID, PLASMA  LACTIC ACID, PLASMA  COMPREHENSIVE METABOLIC PANEL  MAGNESIUM  D-DIMER, QUANTITATIVE  I-STAT VENOUS BLOOD GAS, ED    EKG EKG Interpretation  Date/Time:  Monday June 02 2022 15:36:13 EDT Ventricular Rate:  73 PR Interval:  192 QRS Duration: 87 QT Interval:  418 QTC Calculation: 461 R Axis:   87 Text Interpretation: Sinus rhythm LAE, consider biatrial enlargement Borderline right axis deviation Borderline T abnormalities, anterior leads Confirmed by Lennice Sites ([id number]) on 06/03/2022 5:56:23 PM  Radiology  CXR 06/02/22: IMPRESSION: 1. Cardiomegaly with vascular congestion, mild interstitial edema and small bilateral effusions 2. Emphysema. 3. Basilar airspace disease, atelectasis versus pneumonia      Procedures Procedures    Medications Ordered in ED Medications  ipratropium-albuterol (DUONEB) 0.5-2.5 (3) MG/3ML nebulizer solution 3 mL ( Nebulization Canceled Entry 06/02/22 1700)  ipratropium-albuterol (DUONEB) 0.5-2.5 (3) MG/3ML nebulizer solution 3 mL (3 mLs Nebulization Given 06/02/22 1713)  furosemide (LASIX) injection 40 mg (40 mg Intravenous Given 06/02/22 2335)  methylPREDNISolone sodium succinate (SOLU-MEDROL) 125 mg/2 mL injection 125 mg (125 mg Intravenous Given 06/02/22 2335)  ipratropium-albuterol (DUONEB) 0.5-2.5 (3) MG/3ML nebulizer solution 3 mL (3 mLs Nebulization Given 06/02/22 2332)    ED Course/ Medical  Decision Making/ A&P                          Medical Decision Making Amount and/or Complexity of Data Reviewed Labs: ordered. Decision-making details documented in ED Course. Radiology: ordered. Decision-making details documented in ED Course.  Risk Prescription drug management. Decision regarding hospitalization.   Patient w/ SOB chronically worsening likely d/t chronic CHF and COPD, end stage. ON 6L [place] at home to help. Worsened to the point that she needed to come into ED today. Consider worsening of chronic diseases CHF and COPD, pulm edema, pleural effusions, or pneumonia, will give duonebs and obtain CXR and labs. W/ generalized weakness and SOB will obtain electrolytes, EKG. Will also obtain D-dimer to evaluate for PE given decreased activity, though no leg swelling/tenderness. Consider anemia. No fever/tachycardia to suggest sepsis.    I have personally reviewed and interpreted all labs and imaging.   Clinical Course as of 06/06/22 0046  Mon Jun 02, 2022  1651 DG Chest [place] 1 View 1. Cardiomegaly with vascular congestion, mild interstitial edema and small bilateral  effusions 2. Emphysema. 3. Basilar airspace disease, atelectasis versus pneumonia [HN]  1653 Labs from PCP earlier this afternoon demonstrate increase in Cr to 1.4 from 1.2 in May, Decrease in Hgb to 9.0 from 10 in May.  [HN]  1942 D-Dimer, Quant(!): 2.69 [HN]  1942 B Natriuretic Peptide(!): 0,768.0 [HN]    Clinical Course User Index [HN] Audley Hose, MD   Patient likely w/ acute on chronic COPD exacerbation and CHF. Pulm note recommends no SpO2 >90%, and patient's O2 decreased to 3L [place]. Patient's w/u demonstrates elevated proBNP and pulm edema/pleural effusions on CXR. Will give lasix as well as duonebs and methylprednisolone to treat for CHF and COPD. Patient also w/ elevated d-dimer and will likely need CT PE. Patient does feel improved after duonebs but is still tachypneic w/ slightly increased WOB. Will need  admission for management and w/u.   Dispo: admit         Final Clinical Impression(s) / ED Diagnoses Final diagnoses:  Acute on chronic respiratory failure with hypoxia ([place])  Acute on chronic diastolic heart failure ([place])    Rx / DC Orders ED Discharge Orders     None        This note was created using dictation software, which may contain spelling or grammatical errors.    Audley Hose, MD 06/06/22 [phone]

## 2022-06-02 NOTE — Telephone Encounter (Signed)
Pt came to lab at [facility] by w/c; pts husband wanted someone to eval pt; pt has COPD and CHF; Mr [facility] [place] signed) said for the last 2 wk pt condition has significantly worsened. Pt clears throat a lot; raspy voice, shallow breathing.and limited mobility. Pt complaining of shortness of breath; for 2 months pts husband increased oxygen to 6L 24/7. Pt said albuterol inhaler helps but for very short period of time. Pt said taking Lasix 40 mg bid and has not noted any extremity or abd swelling. In last 24 hrs estimates pt has drank 5 oz of OJ and few sips of pepsi and gingerale. Pt does not drink water. No abd pain, ? Dizziness, pt does have dry mouth and urine is dark Yellow in color. T 97.8 P 77 BP lt arm sitting reg cuff 100/46. Pt oxygen level consistently at 91% with 6L oxygen.  Dr Lorelei Pont said he could work pt in this afternoon but with pts vitals and oxygen level may need to go to ED. Pt and pts family agreed pt should go to ED. Pt said she cannot go by car; pt said she can not get in and out of car again. Pt and pts family request EMS to take pt to ED and they also request I call [facility] to let them be aware of pt condition; I explained I will call [place] but nurse will triage pt when arrives at ED. I spoke with Judson Roch at [facility] ED triage. Sending note to Dr Diona Browner as PCP who is out of office and Dr Lorelei Pont. I also asked pts daughter and husband to do FU visit with Dr Melvyn Novas in pulmonology and they both voiced understanding.

## 2022-06-02 NOTE — Telephone Encounter (Signed)
Scheduled cpe for 06/06/22. Pt's husband stated they aren't sure what Ketoconazole cream is, so no refill.

## 2022-06-02 NOTE — ED Triage Notes (Signed)
PCP called EMS for SOB stated 1 week of SOB and no appetite stated saturations were at 91% 6L with EMS was 94% alert and oriented

## 2022-06-02 NOTE — Progress Notes (Signed)
No critical labs need to be addressed urgently. We will discuss labs in detail at upcoming office visit.

## 2022-06-02 NOTE — Telephone Encounter (Signed)
Agree with disposition.

## 2022-06-02 NOTE — ED Notes (Signed)
Family would like and update and has some questions for EDP, MD Naasz notified.

## 2022-06-02 NOTE — Telephone Encounter (Signed)
-----  Message from Ellamae Sia sent at 06/02/2022  9:33 AM EDT ----- Regarding: lab orders for today, Patient is scheduled for CPX labs, please order future labs, Thanks , Terri    Late add on

## 2022-06-03 ENCOUNTER — Inpatient Hospital Stay (HOSPITAL_COMMUNITY): Payer: Medicare HMO

## 2022-06-03 ENCOUNTER — Telehealth: Payer: Medicare HMO

## 2022-06-03 ENCOUNTER — Encounter (HOSPITAL_COMMUNITY): Payer: Self-pay | Admitting: Family Medicine

## 2022-06-03 DIAGNOSIS — I50810 Right heart failure, unspecified: Secondary | ICD-10-CM

## 2022-06-03 DIAGNOSIS — I5033 Acute on chronic diastolic (congestive) heart failure: Secondary | ICD-10-CM | POA: Diagnosis not present

## 2022-06-03 DIAGNOSIS — R7989 Other specified abnormal findings of blood chemistry: Secondary | ICD-10-CM

## 2022-06-03 DIAGNOSIS — J439 Emphysema, unspecified: Secondary | ICD-10-CM | POA: Insufficient documentation

## 2022-06-03 DIAGNOSIS — I5031 Acute diastolic (congestive) heart failure: Secondary | ICD-10-CM | POA: Diagnosis not present

## 2022-06-03 DIAGNOSIS — N1832 Chronic kidney disease, stage 3b: Secondary | ICD-10-CM | POA: Diagnosis present

## 2022-06-03 DIAGNOSIS — J441 Chronic obstructive pulmonary disease with (acute) exacerbation: Secondary | ICD-10-CM | POA: Insufficient documentation

## 2022-06-03 LAB — ECHOCARDIOGRAM COMPLETE
AR max vel: 1.82 cm2
AV Area VTI: 1.82 cm2
AV Area mean vel: 1.77 cm2
AV Mean grad: 9 mmHg
AV Peak grad: 15.5 mmHg
Ao pk vel: 1.97 m/s
Area-P 1/2: 2.77 cm2
Height: 62 in
MV VTI: 1.73 cm2
S' Lateral: 2.6 cm
Weight: 2158.74 oz

## 2022-06-03 LAB — HEPATIC FUNCTION PANEL
ALT: 14 U/L (ref 0–44)
AST: 37 U/L (ref 15–41)
Albumin: 3.2 g/dL — ABNORMAL LOW (ref 3.5–5.0)
Alkaline Phosphatase: 71 U/L (ref 38–126)
Bilirubin, Direct: 0.3 mg/dL — ABNORMAL HIGH (ref 0.0–0.2)
Indirect Bilirubin: 0.8 mg/dL (ref 0.3–0.9)
Total Bilirubin: 1.1 mg/dL (ref 0.3–1.2)
Total Protein: 6.4 g/dL — ABNORMAL LOW (ref 6.5–8.1)

## 2022-06-03 LAB — CBC
HCT: 29.5 % — ABNORMAL LOW (ref 36.0–46.0)
Hemoglobin: 9.1 g/dL — ABNORMAL LOW (ref 12.0–15.0)
MCH: 32.5 pg (ref 26.0–34.0)
MCHC: 30.8 g/dL (ref 30.0–36.0)
MCV: 105.4 fL — ABNORMAL HIGH (ref 80.0–100.0)
Platelets: 120 10*3/uL — ABNORMAL LOW (ref 150–400)
RBC: 2.8 MIL/uL — ABNORMAL LOW (ref 3.87–5.11)
RDW: 17.6 % — ABNORMAL HIGH (ref 11.5–15.5)
WBC: 3.9 10*3/uL — ABNORMAL LOW (ref 4.0–10.5)
nRBC: 0 % (ref 0.0–0.2)

## 2022-06-03 LAB — TROPONIN I (HIGH SENSITIVITY): Troponin I (High Sensitivity): 16 ng/L (ref <18)

## 2022-06-03 LAB — BASIC METABOLIC PANEL
Anion gap: 13 (ref 5–15)
BUN: 20 mg/dL (ref 8–23)
CO2: 36 mmol/L — ABNORMAL HIGH (ref 22–32)
Calcium: 8.8 mg/dL — ABNORMAL LOW (ref 8.9–10.3)
Chloride: 94 mmol/L — ABNORMAL LOW (ref 98–111)
Creatinine, Ser: 1.51 mg/dL — ABNORMAL HIGH (ref 0.44–1.00)
GFR, Estimated: 36 mL/min — ABNORMAL LOW (ref >60)
Glucose, Bld: 125 mg/dL — ABNORMAL HIGH (ref 70–99)
Potassium: 4.1 mmol/L (ref 3.5–5.1)
Sodium: 143 mmol/L (ref 135–145)

## 2022-06-03 LAB — PROTIME-INR
INR: 1.2 (ref 0.8–1.2)
Prothrombin Time: 15 seconds (ref 11.4–15.2)

## 2022-06-03 LAB — MAGNESIUM: Magnesium: 2.1 mg/dL (ref 1.7–2.4)

## 2022-06-03 MED ORDER — HEPARIN SODIUM (PORCINE) 5000 UNIT/ML IJ SOLN
5000.0000 [IU] | Freq: Three times a day (TID) | INTRAMUSCULAR | Status: DC
  Administered 2022-06-03 – 2022-06-07 (×13): 5000 [IU] via SUBCUTANEOUS
  Filled 2022-06-03 (×13): qty 1

## 2022-06-03 MED ORDER — SODIUM CHLORIDE 0.9% FLUSH
3.0000 mL | Freq: Two times a day (BID) | INTRAVENOUS | Status: DC
  Administered 2022-06-03 – 2022-06-07 (×10): 3 mL via INTRAVENOUS

## 2022-06-03 MED ORDER — LEVOTHYROXINE SODIUM 75 MCG PO TABS
75.0000 ug | ORAL_TABLET | Freq: Every day | ORAL | Status: DC
  Administered 2022-06-03 – 2022-06-07 (×5): 75 ug via ORAL
  Filled 2022-06-03 (×5): qty 1

## 2022-06-03 MED ORDER — AMBRISENTAN 5 MG PO TABS
10.0000 mg | ORAL_TABLET | Freq: Every day | ORAL | Status: DC
  Administered 2022-06-03 – 2022-06-07 (×5): 10 mg via ORAL
  Filled 2022-06-03 (×5): qty 2

## 2022-06-03 MED ORDER — ONDANSETRON HCL 4 MG PO TABS: 4.0000 mg | ORAL_TABLET | Freq: Four times a day (QID) | ORAL | Status: DC | PRN

## 2022-06-03 MED ORDER — TADALAFIL 20 MG PO TABS
40.0000 mg | ORAL_TABLET | Freq: Every day | ORAL | Status: DC
  Administered 2022-06-03 – 2022-06-07 (×5): 40 mg via ORAL
  Filled 2022-06-03 (×5): qty 2

## 2022-06-03 MED ORDER — HYDROXYZINE HCL 25 MG PO TABS
50.0000 mg | ORAL_TABLET | Freq: Every day | ORAL | Status: DC
  Administered 2022-06-03 – 2022-06-06 (×5): 50 mg via ORAL
  Filled 2022-06-03 (×5): qty 2

## 2022-06-03 MED ORDER — IOHEXOL 350 MG/ML SOLN
60.0000 mL | Freq: Once | INTRAVENOUS | Status: AC | PRN
  Administered 2022-06-03: 60 mL via INTRAVENOUS

## 2022-06-03 MED ORDER — ONDANSETRON HCL 4 MG/2ML IJ SOLN: 4.0000 mg | Freq: Four times a day (QID) | INTRAMUSCULAR | Status: DC | PRN

## 2022-06-03 MED ORDER — ACETAMINOPHEN 650 MG RE SUPP: 650.0000 mg | Freq: Four times a day (QID) | RECTAL | Status: DC | PRN

## 2022-06-03 MED ORDER — FUROSEMIDE 10 MG/ML IJ SOLN
40.0000 mg | Freq: Two times a day (BID) | INTRAMUSCULAR | Status: DC
  Administered 2022-06-03 – 2022-06-04 (×3): 40 mg via INTRAVENOUS
  Filled 2022-06-03 (×3): qty 4

## 2022-06-03 MED ORDER — SPIRONOLACTONE 12.5 MG HALF TABLET
12.5000 mg | ORAL_TABLET | Freq: Every day | ORAL | Status: DC
  Filled 2022-06-03: qty 1

## 2022-06-03 MED ORDER — PANTOPRAZOLE SODIUM 40 MG PO TBEC
40.0000 mg | DELAYED_RELEASE_TABLET | Freq: Every day | ORAL | Status: DC
  Administered 2022-06-03 – 2022-06-07 (×5): 40 mg via ORAL
  Filled 2022-06-03 (×5): qty 1

## 2022-06-03 MED ORDER — SENNOSIDES-DOCUSATE SODIUM 8.6-50 MG PO TABS: 1.0000 | ORAL_TABLET | Freq: Every evening | ORAL | Status: DC | PRN

## 2022-06-03 MED ORDER — ASPIRIN 81 MG PO TBEC
81.0000 mg | DELAYED_RELEASE_TABLET | Freq: Every day | ORAL | Status: DC
  Administered 2022-06-03 – 2022-06-07 (×5): 81 mg via ORAL
  Filled 2022-06-03 (×5): qty 1

## 2022-06-03 MED ORDER — IPRATROPIUM-ALBUTEROL 0.5-2.5 (3) MG/3ML IN SOLN
3.0000 mL | RESPIRATORY_TRACT | Status: DC | PRN
  Administered 2022-06-03: 3 mL via RESPIRATORY_TRACT
  Filled 2022-06-03: qty 3

## 2022-06-03 MED ORDER — ACETAMINOPHEN 325 MG PO TABS: 650.0000 mg | ORAL_TABLET | Freq: Four times a day (QID) | ORAL | Status: DC | PRN

## 2022-06-03 NOTE — Progress Notes (Signed)
Indication - Continuation of prior to admission medication   Patient is 76 y.o.  with history of PAH on chronic Ambrisentan (Letairis) PTA and will be continued while hospitalized.   Continuing this medication order as an inpatient requires that monitoring parameters per REMS requirements must be met.  Chronic therapy is under the supervision of Dr. Haroldine Laws who is enrolled in the REMS program and is being notified of continuation of therapy. A staff message in EPIC has been sent notifying the certified prescriber.  Per patient report has previously been educated on Pregnancy Risk and Hepatotoxicity . On admission pregnancy risk has been assessed and no monitoring required. Hepatic function has been evaluated. AST/ALT appropriate to continue medication at this time.     Latest Ref Rng & Units 06/02/2022    2:11 PM 01/08/2022   11:07 AM 05/03/2021    1:30 PM  Hepatic Function  Total Protein 6.0 - 8.3 g/dL 6.6  6.9  6.4   Albumin 3.5 - 5.2 g/dL 3.6  3.7  2.6   AST 0 - 37 U/L _0 ALT 0 - 35 U/L _1 Alk Phosphatase 39 - 117 U/L 85  68  48   Total Bilirubin 0.2 - 1.2 mg/dL 0.6  0.4  0.5   Bilirubin, Direct 0.0 - 0.2 mg/dL   0.1    REMS program was contacted and confirmed patient is enrolled within REMS. Stated that patient is current and Ambrisentan is able to be dispensed.   If any question arise or pregnancy is identified during hospitalization, contact for bosentan: [phone]; macitentan: [phone]; ambrisentan: [phone].

## 2022-06-03 NOTE — ED Notes (Signed)
ED TO INPATIENT HANDOFF REPORT  ED Nurse Name and Phone #: Caryl Pina RN [phone]  S Name/Age/Gender Linda Stevens Racey 76 y.o. female Room/Bed: 026C/026C  Code Status   Code Status: DNR  Home/SNF/Other Home Patient oriented to: self, place, time, and situation Is this baseline? Yes   Triage Complete: Triage complete  Chief Complaint Acute on chronic diastolic CHF (congestive heart failure) ([place]) [I50.33]  Triage Note PCP called EMS for SOB stated 1 week of SOB and no appetite stated saturations were at 91% 6L with EMS was 94% alert and oriented     Allergies Allergies  Allergen Reactions   Amoxicillin-Pot Clavulanate Other (See Comments)    REACTION: gi upset Has patient had a PCN reaction causing immediate rash, facial/tongue/throat swelling, SOB or lightheadedness with hypotension: No Has patient had a PCN reaction causing severe rash involving mucus membranes or skin necrosis: No Has patient had a PCN reaction that required hospitalization : No Has patient had a PCN reaction occurring within the last 10 years: No If all of the above answers are "NO", then may proceed with Cephalosporin use.    Cefdinir Other (See Comments)    gi  upset   Singulair [Montelukast Sodium] Itching        Moxifloxacin Rash    Level of Care/Admitting Diagnosis ED Disposition     ED Disposition  Admit   Condition  --   [place]: [place] [[id number]]  Level of Care: Telemetry Cardiac [[id number]]  May admit patient to [facility] or [facility] if equivalent level of care is available:: Yes  Covid Evaluation: Asymptomatic - no recent exposure (last 10 days) testing not required  Diagnosis: Acute on chronic diastolic CHF (congestive heart failure) [facility]) [[id number]]  Admitting Physician: Vianne Bulls [[id number]]  Attending Physician: Vianne Bulls [[id number]]  Certification:: I certify this patient will need inpatient services for at least 2 midnights  Estimated  Length of Stay: 3          B Medical/Surgery History Past Medical History:  Diagnosis Date   Acute respiratory failure with hypoxia and hypercapnea 02/26/2014   Allergic rhinitis due to pollen    Allergy    Anemia 06/12/2020   Arthritis    Asthma    CHF (congestive heart failure) (HCC)    COPD (chronic obstructive pulmonary disease) ([place])    Diastolic dysfunction    a. 02/2014 Echo: EF 65-70%, Gr 1 DD, RVH;  b. 09/2015 Echo: EF 55%, Gr 1 DD.   Emphysema of lung (HCC)    H/O seasonal allergies    History of chicken pox    History of tobacco abuse    a. Quit 2015.   History of UTI    Hypertension    Hypertensive heart disease    Lower extremity edema    Pericardial effusion    a. 09/2015 Echo: Mod eff w/o tamponade.   Right heart failure with reduced right ventricular function ([place])    a. 09/2015 Echo: EF 55%, Gr 1 DD, D shaped IV septum, sev dil RV with mod reduced fxn, mod dil RA, RV-RA grad 66mHg, PASP 857mg, mod pericard eff w/o tamponade.   Severe Pulmonary Hypertension    a. 09/2015 Echo: PASP 8745m.   Past Surgical History:  Procedure Laterality Date   CARDIAC CATHETERIZATION N/A 10/08/2015   Procedure: Right/Left Heart Cath and Coronary Angiography;  Surgeon: DanJolaine ArtistD;  Location: [facility];  Service: Cardiovascular;  Laterality:  N/A;   CHOLECYSTECTOMY     Open   COLONOSCOPY WITH PROPOFOL N/A 06/07/2020   Procedure: COLONOSCOPY WITH PROPOFOL;  Surgeon: Jerene Bears, MD;  Location: [place];  Service: Gastroenterology;  Laterality: N/A;   ESOPHAGOGASTRODUODENOSCOPY (EGD) WITH PROPOFOL N/A 06/07/2020   Procedure: ESOPHAGOGASTRODUODENOSCOPY (EGD) WITH PROPOFOL;  Surgeon: Jerene Bears, MD;  Location: [facility] END[facility]COPY;  Service: Gastroenterology;  Laterality: N/A;   POLYPECTOMY  06/07/2020   Procedure: POLYPECTOMY;  Surgeon: Jerene Bears, MD;  Location: [place];  Service: Gastroenterology;;   RIGHT HEART CATH N/A 01/26/2017   Procedure: Right  Heart Cath;  Surgeon: Jolaine Artist, MD;  Location: [place] [facility];  Service: Cardiovascular;  Laterality: N/A;   RIGHT HEART CATH N/A 08/12/2017   Procedure: RIGHT HEART CATH;  Surgeon: Jolaine Artist, MD;  Location: [facility] [facility];  Service: Cardiovascular;  Laterality: N/A;     A IV Location/Drains/Wounds Patient Lines/Drains/Airways Status     Active Line/Drains/Airways     Name Placement date Placement time Site Days   Peripheral IV 06/02/22 20 G Left Antecubital 06/02/22  1656  Antecubital  1            Intake/Output Last 24 hours  Intake/Output Summary (Last 24 hours) at 06/03/2022 1257 Last data filed at 06/03/2022 0618 Gross per 24 hour  Intake --  Output 350 ml  Net -350 ml    Labs/Imaging Results for orders placed or performed during the hospital encounter of 06/02/22 (from the past 48 hour(s))  I-Stat venous blood gas, [facility] ED only)     Status: Abnormal   Collection Time: 06/02/22  4:58 PM  Result Value Ref Range   pH, Ven 7.429 7.25 - 7.43   pCO2, Ven 61.2 (H) 44 - 60 mmHg   pO2, Ven 60 (H) 32 - 45 mmHg   Bicarbonate 40.6 (H) 20.0 - 28.0 mmol/L   TCO2 42 (H) 22 - 32 mmol/L   O2 Saturation 90 %   Acid-Base Excess 14.0 (H) 0.0 - 2.0 mmol/L   Sodium 142 135 - 145 mmol/L   Potassium 4.2 3.5 - 5.1 mmol/L   Calcium, Ion 1.12 (L) 1.15 - 1.40 mmol/L   HCT 30.0 (L) 36.0 - 46.0 %   Hemoglobin 10.2 (L) 12.0 - 15.0 g/dL   Sample type VENOUS   Lactic acid, plasma     Status: None   Collection Time: 06/02/22  5:12 PM  Result Value Ref Range   Lactic Acid, Venous 1.0 0.5 - 1.9 mmol/L    Comment: Performed at [facility], [address], [place], [place] [address]  Magnesium     Status: None   Collection Time: 06/02/22  5:30 PM  Result Value Ref Range   Magnesium 2.1 1.7 - 2.4 mg/dL    Comment: Performed at [facility] [address] [place], [place] [address]  Brain natriuretic peptide     Status: Abnormal   Collection Time:  06/02/22  5:31 PM  Result Value Ref Range   B Natriuretic Peptide 1,663.8 (H) 0.0 - 100.0 pg/mL    Comment: Performed at [facility] [address] [place], [place] [address]  D-dimer, quantitative     Status: Abnormal   Collection Time: 06/02/22  5:31 PM  Result Value Ref Range   D-Dimer, Quant 2.69 (H) 0.00 - 0.50 ug/mL-FEU    Comment: (NOTE) At the manufacturer cut-off value of 0.5 g/mL FEU, this assay has a  negative predictive value of 95-100%.This assay is intended for use in conjunction with a clinical pretest probability (PTP) assessment model to exclude pulmonary embolism (PE) and deep venous thrombosis (DVT) in outpatients suspected of PE or DVT. Results should be correlated with clinical presentation. Performed at [facility], [place] [address], [place], [place] [address]   Basic metabolic panel     Status: Abnormal   Collection Time: 06/03/22  3:55 AM  Result Value Ref Range   Sodium 143 135 - 145 mmol/L   Potassium 4.1 3.5 - 5.1 mmol/L   Chloride 94 (L) 98 - 111 mmol/L   CO2 36 (H) 22 - 32 mmol/L   Glucose, Bld 125 (H) 70 - 99 mg/dL    Comment: Glucose reference range applies only to samples taken after fasting for at least 8 hours.   BUN 20 8 - 23 mg/dL   Creatinine, Ser 1.51 (H) 0.44 - 1.00 mg/dL   Calcium 8.8 (L) 8.9 - 10.3 mg/dL   GFR, Estimated 36 (L) >60 mL/min    Comment: (NOTE) Calculated using the CKD-EPI Creatinine Equation (2021)    Anion gap 13 5 - 15    Comment: Performed at [facility] [address], [place], [place] [address]  Magnesium     Status: None   Collection Time: 06/03/22  3:55 AM  Result Value Ref Range   Magnesium 2.1 1.7 - 2.4 mg/dL    Comment: Performed at [facility] [address] [place], [place] [address]  CBC     Status: Abnormal   Collection Time: 06/03/22  3:55 AM  Result Value Ref Range   WBC 3.9 (L) 4.0 - 10.5 K/uL   RBC 2.80 (L) 3.87 - 5.11 MIL/uL   Hemoglobin 9.1 (L) 12.0 - 15.0 g/dL    HCT 29.5 (L) 36.0 - 46.0 %   MCV 105.4 (H) 80.0 - 100.0 fL   MCH 32.5 26.0 - 34.0 pg   MCHC 30.8 30.0 - 36.0 g/dL   RDW 17.6 (H) 11.5 - 15.5 %   Platelets 120 (L) 150 - 400 K/uL    Comment: REPEATED TO VERIFY   nRBC 0.0 0.0 - 0.2 %    Comment: Performed at [facility], [place] [address] [place], [place] [address]  Protime-INR     Status: None   Collection Time: 06/03/22  9:39 AM  Result Value Ref Range   Prothrombin Time 15.0 11.4 - 15.2 seconds   INR 1.2 0.8 - 1.2    Comment: (NOTE) INR goal varies based on device and disease states. Performed at [facility], [place] [address], [place], [place] [address]   Hepatic function panel     Status: Abnormal   Collection Time: 06/03/22  9:39 AM  Result Value Ref Range   Total Protein 6.4 (L) 6.5 - 8.1 g/dL   Albumin 3.2 (L) 3.5 - 5.0 g/dL   AST 37 15 - 41 U/L    Comment: HEMOLYSIS AT THIS LEVEL MAY AFFECT RESULT   ALT 14 0 - 44 U/L    Comment: HEMOLYSIS AT THIS LEVEL MAY AFFECT RESULT   Alkaline Phosphatase 71 38 - 126 U/L   Total Bilirubin 1.1 0.3 - 1.2 mg/dL    Comment: HEMOLYSIS AT THIS LEVEL MAY AFFECT RESULT   Bilirubin, Direct 0.3 (H) 0.0 - 0.2 mg/dL   Indirect Bilirubin 0.8 0.3 - 0.9 mg/dL    Comment: Performed at [facility], [address] [place], [place] [address]  Troponin I (High Sensitivity)     Status: None   Collection Time: 06/03/22  9:39 AM  Result Value Ref Range   Troponin I (High Sensitivity) 16 <18 ng/L    Comment: (NOTE) Elevated high sensitivity troponin I (hsTnI) values and significant  changes across serial measurements may suggest ACS but many other  chronic and acute conditions are known to elevate hsTnI results.  Refer to the "Links" section for chest pain algorithms and additional  guidance. Performed at [facility], [place] [address] [place], [place] [address]    CT Angio Chest Pulmonary Embolism (PE) W or WO Contrast  Result Date: 06/03/2022 CLINICAL DATA:  Positive  D-dimer, dyspnea on exertion EXAM: CT ANGIOGRAPHY CHEST WITH CONTRAST TECHNIQUE: Multidetector CT imaging of the chest was performed using the standard protocol during bolus administration of intravenous contrast. Multiplanar CT image reconstructions and MIPs were obtained to evaluate the vascular anatomy. RADIATION DOSE REDUCTION: This exam was performed according to the departmental dose-optimization program which includes automated exposure control, adjustment of the mA and/or kV according to patient size and/or use of iterative reconstruction technique. CONTRAST:  11m OMNIPAQUE IOHEXOL 350 MG/ML SOLN COMPARISON:  CT done on 09/10/2021, chest radiograph done on 06/02/2022 FINDINGS: Cardiovascular: There is homogeneous enhancement in thoracic aorta. There is focal ectasia of lower course of descending thoracic aorta measuring 3.2 cm. Coronary artery calcifications are seen. There is ectasia of main pulmonary artery measuring 4 cm. There are no intraluminal filling defects in pulmonary artery branches. Evaluation of small peripheral branches in the lower lung fields is limited by infiltrates. Heart is enlarged in size. Mediastinum/Nodes: There are slightly enlarged lymph nodes in mediastinum and hilar regions with no significant change. Lungs/Pleura: Centrilobular emphysema is seen. There are patchy linear infiltrates in right middle lobe, lingula and both lower lobes, more so in right lower lobe. In image 27 of series 6, there is 1.7 x 0.8 cm nodular density in the medial left upper lung field. Small to moderate bilateral pleural effusions are seen, more so on the right side. There is mild thickening of pleura in the periphery of right lower lung field. There is no pneumothorax. Upper Abdomen: There is mild nodularity in liver surface suggesting possible cirrhosis. Musculoskeletal: There is mild decrease in height of upper endplates of bodies of TT64and T12 vertebrae with no significant interval change. Review of  the MIP images confirms the above findings. IMPRESSION: There is no evidence of pulmonary artery embolism. There is ectasia of main pulmonary artery suggesting pulmonary arterial hypertension. There is no evidence of thoracic aortic dissection. There is aneurysmal dilation in the descending thoracic aorta measuring 3.2 cm. Coronary artery disease. Cardiomegaly. COPD. Patchy infiltrates are seen in both lungs, more so in right lower lobe suggesting atelectasis/pneumonia. Small to moderate bilateral pleural effusions are seen, more so on the right side with interval increase. There is 1.7 x 0.8 cm pleural-based density in the medial left upper lobe which may suggest atelectasis/pneumonia or neoplastic process. Short-term follow-up CT chest in 3 months should be considered to re-evaluate this finding. If the density is persistent, PET-CT should be considered. Other findings as described in the body of the report. Electronically Signed   By: PElmer PickerM.D.   On: 06/03/2022 12:50   VAS [place]OWER EXTREMITY VENOUS (DVT)  Result Date: 06/03/2022  Lower Venous DVT Study Patient Name:  Linda Stevens Date of Exam:   06/03/2022 Medical  Rec #: [id number]            Accession #:    [id number] Date of Birth: Mar 05, 1946            Patient Gender: F Patient Age:   45 years Exam Location:  [facility] Procedure:      VAS [place] LOWER EXTREMITY VENOUS (DVT) Referring Phys: A POWELL JR --------------------------------------------------------------------------------  Indications: Elevated Ddimer.  Risk Factors: None identified. Limitations: Poor ultrasound/tissue interface and patient pain tolerance. Comparison Study: No prior studies. Performing Technologist: Oliver Hum RVT  Examination Guidelines: A complete evaluation includes B-mode imaging, spectral Doppler, color Doppler, and power Doppler as needed of all accessible portions of each vessel. Bilateral testing is considered an integral part of a complete  examination. Limited examinations for reoccurring indications may be performed as noted. The reflux portion of the exam is performed with the patient in reverse Trendelenburg.  +---------+---------------+---------+-----------+----------+--------------+ RIGHT    CompressibilityPhasicitySpontaneityPropertiesThrombus Aging +---------+---------------+---------+-----------+----------+--------------+ CFV      Full           Yes      Yes                                 +---------+---------------+---------+-----------+----------+--------------+ SFJ      Full                                                        +---------+---------------+---------+-----------+----------+--------------+ FV Prox  Full                                                        +---------+---------------+---------+-----------+----------+--------------+ FV Mid   Full                                                        +---------+---------------+---------+-----------+----------+--------------+ FV DistalFull                                                        +---------+---------------+---------+-----------+----------+--------------+ PFV      Full                                                        +---------+---------------+---------+-----------+----------+--------------+ POP      Full           Yes      Yes                                 +---------+---------------+---------+-----------+----------+--------------+ PTV      Full                                                        +---------+---------------+---------+-----------+----------+--------------+  PERO     Full                                                        +---------+---------------+---------+-----------+----------+--------------+   +---------+---------------+---------+-----------+----------+--------------+ LEFT     CompressibilityPhasicitySpontaneityPropertiesThrombus Aging  +---------+---------------+---------+-----------+----------+--------------+ CFV      Full           Yes      Yes                                 +---------+---------------+---------+-----------+----------+--------------+ SFJ      Full                                                        +---------+---------------+---------+-----------+----------+--------------+ FV Prox  Full                                                        +---------+---------------+---------+-----------+----------+--------------+ FV Mid   Full                                                        +---------+---------------+---------+-----------+----------+--------------+ FV DistalFull                                                        +---------+---------------+---------+-----------+----------+--------------+ PFV      Full                                                        +---------+---------------+---------+-----------+----------+--------------+ POP      Full           Yes      Yes                                 +---------+---------------+---------+-----------+----------+--------------+ PTV      Full                                                        +---------+---------------+---------+-----------+----------+--------------+ PERO     Full                                                        +---------+---------------+---------+-----------+----------+--------------+  Summary: RIGHT: - There is no evidence of deep vein thrombosis in the lower extremity. However, portions of this examination were limited- see technologist comments above.  - No cystic structure found in the popliteal fossa.  LEFT: - There is no evidence of deep vein thrombosis in the lower extremity. However, portions of this examination were limited- see technologist comments above.  - No cystic structure found in the popliteal fossa.  *See table(s) above for measurements and observations.     Preliminary    DG Chest Port 1 View  Result Date: 06/02/2022 CLINICAL DATA:  Shortness of breath EXAM: PORTABLE CHEST 1 VIEW COMPARISON:  08/19/2021, CT 09/10/2021 FINDINGS: Cardiomegaly with small bilateral pleural effusions. Vascular congestion and mild diffuse interstitial edema. There is emphysema. Basilar airspace disease. IMPRESSION: 1. Cardiomegaly with vascular congestion, mild interstitial edema and small bilateral effusions 2. Emphysema. 3. Basilar airspace disease, atelectasis versus pneumonia Electronically Signed   By: Donavan Foil M.D.   On: 06/02/2022 16:04    Pending Labs Unresulted Labs (From admission, onward)     Start     Ordered   06/03/22 1610  Basic metabolic panel  Daily at 5am,   R      06/03/22 0014            Vitals/Pain Today's Vitals   06/03/22 0816 06/03/22 0830 06/03/22 0930 06/03/22 1215  BP: (!) 143/63 132/60 (!) 119/51 (!) 114/52  Pulse: 100 96 87 85  Resp: 20 (!) 24 (!) 22 (!) 21  Temp:   97.8 F (36.6 C)   TempSrc:   Oral   SpO2: 92% 92% 97% 99%  Weight:      Height:      PainSc:        Isolation Precautions No active isolations  Medications Medications  ipratropium-albuterol (DUONEB) 0.5-2.5 (3) MG/3ML nebulizer solution 3 mL ( Nebulization Canceled Entry 06/02/22 1700)  aspirin EC tablet 81 mg (81 mg Oral Given 06/03/22 1002)  ambrisentan (LETAIRIS) tablet 10 mg (10 mg Oral Given 06/03/22 1001)  hydrOXYzine (ATARAX) tablet 50 mg (50 mg Oral Given 06/03/22 0106)  levothyroxine (SYNTHROID) tablet 75 mcg (75 mcg Oral Given 06/03/22 0552)  pantoprazole (PROTONIX) EC tablet 40 mg (40 mg Oral Given 06/03/22 1002)  heparin injection 5,000 Units (5,000 Units Subcutaneous Not Given 06/03/22 0547)  sodium chloride flush (NS) 0.9 % injection 3 mL (3 mLs Intravenous Given 06/03/22 1002)  acetaminophen (TYLENOL) tablet 650 mg (has no administration in time range)    Or  acetaminophen (TYLENOL) suppository 650 mg (has no administration in time range)   senna-docusate (Senokot-S) tablet 1 tablet (has no administration in time range)  ondansetron (ZOFRAN) tablet 4 mg (has no administration in time range)    Or  ondansetron (ZOFRAN) injection 4 mg (has no administration in time range)  furosemide (LASIX) injection 40 mg (40 mg Intravenous Given 06/03/22 0816)  ipratropium-albuterol (DUONEB) 0.5-2.5 (3) MG/3ML nebulizer solution 3 mL (3 mLs Nebulization Given 06/03/22 0513)  ipratropium-albuterol (DUONEB) 0.5-2.5 (3) MG/3ML nebulizer solution 3 mL (3 mLs Nebulization Given 06/02/22 1713)  furosemide (LASIX) injection 40 mg (40 mg Intravenous Given 06/02/22 2335)  methylPREDNISolone sodium succinate (SOLU-MEDROL) 125 mg/2 mL injection 125 mg (125 mg Intravenous Given 06/02/22 2335)  ipratropium-albuterol (DUONEB) 0.5-2.5 (3) MG/3ML nebulizer solution 3 mL (3 mLs Nebulization Given 06/02/22 2332)  iohexol (OMNIPAQUE) 350 MG/ML injection 60 mL (60 mLs Intravenous Contrast Given 06/03/22 1235)    Mobility walks with device Low fall risk   Focused  Assessments Pulmonary Assessment Handoff:  Lung sounds: Bilateral Breath Sounds: [place] O2 Device: Nasal Cannula O2 Flow Rate (L/min): 4 L/min    R Recommendations: See Admitting Provider Note  Report given to:   Additional Notes:

## 2022-06-03 NOTE — Consult Note (Addendum)
Advanced Heart Failure Team Consult Note   Primary Physician: [name], MD PCP-Cardiologist:  [name], MD  Reason for Consultation: acute on chronic HFpEF  HPI:    Linda Stevens is seen today for evaluation of acute on chronic HFpEF at the request of Dr. Myna Hidalgo, Internal medicine.   Linda Stevens is a 76 y.o.female with PMH of HTN, COPD (former smoker quit 2015), seasonal allergies, asthma, recurrent pleural effusions, pulmonary HTN, PAF off anticoagulation 2/2 GIB and diastolic dysfxn.   Admitted 1/17 w/ progressive DOE, hypoxia, and lower ext edema. Found to have severe PAH.  R/LHC 10/08/15 with normal coronaries, normal CO, and severe PAH PAP 104/49 with 13.6 WU.  Started revatio 20 TID with consideration for macitentan as OP. PFTs 10/05/15 FEV1 0.91 (40%) FVC 1.64 (55%), DLCO 8.80 (38%). Auto-immune and infectious serologies (-). VQ (-).    11/18 recurrent pleural effusions and thoracentesis.    RF markedly ++ but other serology negative. Saw Dr. Amil Amen who said she did not have RA but was diagnosed with polyarthralgia/OA.    Admitted 6/21 w/ respiratory failure. EF 55-60% w/ mild RV HK with large peridardial effusion. Suspicion for auto-immune flare. Auto-immune panel again (-). Treated w/ steroids w/ prompt improvement and regression of pericardial effusion. Had PAF. Went to SNF.   Admitted 9/21 with Hgb 3.1. EGD and colonoscopy concern for esophageal candidiasis, multiple polyps removed from the colon and colonic AVMs noted which were suspected to be the cause of ongoing bleeding in the setting of Eliquis. Eliquis stopped. 4 units of PRBC & given IV iron.   Admitted 8/22 with a/c respiratory failure w/ hypoxia suspected to be due to combination of ? COPD exacerbation and a/c CHF. Treated w/ IV lasix, DuoNebs and prednisone. EF 60-65%, RV ok. Was discharged on 4L oxygen (baseline) with [facility] PT/OT.   Presented to the ED with complaints of increased SOB, chronic  cough, and fatigue worsening over 2-3 weeks. Oxygen requirements increased (3 at baseline >6L). Compliant with all meds. BNP 1663, D-dimer 2.69, Cr. 1.46, Hgb 9, Trop (-). EKG NSR 76 bpm, consider BAE, CXR: cardiomegaly with vascular congestion, mild interstitial edema and small bilateral effusions, emphysema, basilar airspace disease, atelectasis versus pneumonia. Treated in ED w/ IV lasix, duonebs and solumedrol. Denies CP.   On 4L [place], resting in bed. Appetite isn't too good at home, drinks lots of fluids (chocolate milk, boost, pepsi, and OJ are her go to). Only med change noted per husband is that she only takes lasix once a day rather than BID, unsure when/why this was changed. She sleeps in a chair, minimal activity at home. SOB + cough. Denies CP and dizziness.   Echo today pending. Last 8/22: EF 60-65%, RV normal inadequate TR signal to assess PA pressure  Review of Systems: [y] = yes, _0  = no   General: Weight gain _1 ; Weight loss _2 ; Anorexia _3 ; Fatigue [ Y]; Fever _4 ; Chills _5 ; Weakness _6   Cardiac: Chest pain/pressure _7 ; Resting SOB _8 ; Exertional SOB [ Y]; Orthopnea _9 ; Pedal Edema _10 ; Palpitations _11 ; Syncope _12 ; Presyncope _13 ; Paroxysmal nocturnal dyspnea_14   Pulmonary: Cough [ Y]; Wheezing_15 ; Hemoptysis_16 ; Sputum _17 ; Snoring _18   GI: Vomiting_19 ; Dysphagia_20 ; Melena_21 ; Hematochezia _22 ; Heartburn_23 ; Abdominal pain _24 ; Constipation _25 ; Diarrhea _26 ; BRBPR _27   GU: Hematuria_28 ; Dysuria _29 ; Nocturia_30   Vascular: Pain in legs with walking _0 ; Pain in feet with lying flat _1 ; Non-healing sores _2 ; Stroke _3 ; TIA _4 ; Slurred speech _5 ;  Neuro: Headaches_6 ; Vertigo_7 ; Seizures_8 ; Paresthesias_9 ;Blurred vision _10 ; Diplopia _11 ; Vision changes _12   Ortho/Skin: Arthritis _13 ; Joint pain _14 ; Muscle pain _15 ; Joint swelling _16 ; Back Pain _17 ; Rash _18   Psych: Depression_19 ; Anxiety_20   Heme: Bleeding problems [ Y]; Clotting disorders _21 ; Anemia _22    Endocrine: Diabetes _23 ; Thyroid dysfunction_24   Home Medications Prior to Admission medications   Medication Sig Start Date End Date Taking? Authorizing Provider  acetaminophen (TYLENOL) 500 MG tablet Take 500 mg by mouth every 4 (four) hours as needed for headache (pain).    [provider]  albuterol (VENTOLIN HFA) 108 (90 Base) MCG/ACT inhaler INHALE 2 PUFFS INTO THE LUNGS EVERY 6 HOURS AS NEEDED FOR WHEEZING/SHORTNESS OF BREATH 03/02/22   Bedsole, Amy E, MD  ambrisentan (LETAIRIS) 10 MG tablet TAKE 1 TABLET BY MOUTH 1 TIME A DAY. DO NOT HANDLE IF PREGNANT.Please make appointment for further refills 03/07/22   Raelie Lohr, Shaune Pascal, MD  aspirin EC 81 MG EC tablet Take 1 tablet (81 mg total) by mouth daily. 10/09/15   Debbe Odea, MD  ferrous sulfate 325 (65 FE) MG EC tablet TAKE 1 TABLET BY MOUTH TWICE A DAY WITH MEALS 01/09/22   Bedsole, Amy E, MD  fluticasone (FLONASE) 50 MCG/ACT nasal spray Place 1 spray into both nostrils daily. 08/19/21   Cobb, Karie Schwalbe, NP  furosemide (LASIX) 40 MG tablet TAKE 1 TABLET BY MOUTH TWICE A DAY 12/26/21   Zasha Belleau, Shaune Pascal, MD  hydrOXYzine (ATARAX) 50 MG tablet TAKE 1/2 TO 1 TABLET BY MOUTH AT BEDTIME FOR ANXIETY 04/30/22   Bedsole, Amy E, MD  levothyroxine (SYNTHROID) 75 MCG tablet TAKE 1 TABLET BY MOUTH EVERY DAY 06/01/22   Bedsole, Amy E, MD  loratadine (CLARITIN) 10 MG tablet TAKE 1 TABLET BY MOUTH EVERY DAY 04/10/22   Cobb, Karie Schwalbe, NP  OXYGEN Inhale 4 L into the lungs continuous.    [provider]  pantoprazole (PROTONIX) 40 MG tablet Take 40 mg by mouth daily.    [provider]  polyvinyl alcohol (LIQUIFILM TEARS) 1.4 % ophthalmic solution Place 1 drop into both eyes daily as needed for dry eyes.    [provider]  potassium chloride SA (KLOR-CON M20) 20 MEQ tablet TAKE 1 TABLET BY MOUTH EVERY DAY 06/01/22   Bedsole, Amy E, MD  spironolactone (ALDACTONE) 25 MG tablet TAKE 1/2 TABLET BY MOUTH EVERY DAY 11/28/21    Annalisa Colonna, Shaune Pascal, MD  tadalafil (CIALIS) 20 MG tablet Take 2 tablets (40 mg total) by mouth daily. Please schedule an appointment for further refills 03/07/22   Romaine Neville, Shaune Pascal, MD    Past Medical History: Past Medical History:  Diagnosis Date   Acute respiratory failure with hypoxia and hypercapnea 02/26/2014   Allergic rhinitis due to pollen    Allergy    Anemia 06/12/2020   Arthritis    Asthma    CHF (congestive heart failure) (HCC)    COPD (chronic obstructive pulmonary disease) ([place])    Diastolic dysfunction    a. 02/2014 Echo: EF 65-70%, Gr 1 DD, RVH;  b. 09/2015 Echo: EF 55%, Gr 1 DD.   Emphysema of lung ([place])    H/O seasonal allergies    History  of chicken pox    History of tobacco abuse    a. Quit 2015.   History of UTI    Hypertension    Hypertensive heart disease    Lower extremity edema    Pericardial effusion    a. 09/2015 Echo: Mod eff w/o tamponade.   Right heart failure with reduced right ventricular function ([place])    a. 09/2015 Echo: EF 55%, Gr 1 DD, D shaped IV septum, sev dil RV with mod reduced fxn, mod dil RA, RV-RA grad 8mHg, PASP 872mg, mod pericard eff w/o tamponade.   Severe Pulmonary Hypertension    a. 09/2015 Echo: PASP 8749m.    Past Surgical History: Past Surgical History:  Procedure Laterality Date   CARDIAC CATHETERIZATION N/A 10/08/2015   Procedure: Right/Left Heart Cath and Coronary Angiography;  Surgeon: DanJolaine ArtistD;  Location: [facility];  Service: Cardiovascular;  Laterality: N/A;   CHOLECYSTECTOMY     Open   COLONOSCOPY WITH PROPOFOL N/A 06/07/2020   Procedure: COLONOSCOPY WITH PROPOFOL;  Surgeon: PyrJerene BearsD;  [facility];  Laterality: N/A;   ESOPHAGOGASTRODUODENOSCOPY (EGD) WITH PROPOFOL N/A 06/07/2020   Procedure: ESOPHAGOGASTRODUODENOSCOPY (EGD) WITH PROPOFOL;  Surgeon: PyrJerene BearsD;  [facility];  Service: Gastroenterology;  Laterality: N/A;    POLYPECTOMY  06/07/2020   Procedure: POLYPECTOMY;  Surgeon: PyrJerene BearsD;  [facility];;   RIGHT HEART CATH N/A 01/26/2017   Procedure: Right Heart Cath;  Surgeon: BenJolaine ArtistD;  Location[facility];  Service: Cardiovascular;  Laterality: N/A;   RIGHT HEART CATH N/A 08/12/2017   Procedure: RIGHT HEART CATH;  Surgeon: BenJolaine ArtistD;  Location[facility];  Service: Cardiovascular;  Laterality: N/A;    Family History: Family History  Problem Relation Age of Onset   Glaucoma Brother    Vascular Disease Father        Died of complications from surgery   Heart attack Father    Asthma Mother        Died of asthma attack   Dementia Mother    Hyperlipidemia Mother    Hypertension Mother     Social History: Social History   Socioeconomic History   Marital status: Married    Spouse name: Not on file   Number of children: Not on file   Years of education: 2   Highest education level: Not on file  Occupational History   Occupation: retired teaPharmacist, hospitalobacco Use   Smoking status: Former    Packs/day: 1.00    Years: 40.00    Total pack years: 40.00    Types: Cigarettes    Quit date: 02/22/2014    Years since quitting: 8.2   Smokeless tobacco: Never  Vaping Use   Vaping Use: Never used  Substance and Sexual Activity   Alcohol use: No   Drug use: No   Sexual activity: Never  Other Topics Concern   Not on file  Social History Narrative   Married with 2 children.  Independent of ADLs.      Does not have a living will.   Would desire CPR but would not want prolonged life support if futile- husband aware.   Social Determinants of Health   Financial Resource Strain: Low Risk  (04/11/2022)   Overall Financial Resource Strain (CARDIA)    Difficulty of Paying Living Expenses: Not hard at all  Food Insecurity: No  Food Insecurity (04/11/2022)   Hunger Vital Sign    Worried About Running Out of Food in the  Last Year: Never true    Ran Out of Food in the Last Year: Never true  Transportation Needs: No Transportation Needs (04/11/2022)   PRAPARE - Hydrologist (Medical): No    Lack of Transportation (Non-Medical): No  Physical Activity: Insufficiently Active (04/11/2022)   Exercise Vital Sign    Days of Exercise per Week: 5 days    Minutes of Exercise per Session: 20 min  Stress: No Stress Concern Present (04/11/2022)   [place]    Feeling of Stress : Not at all  Social Connections: Not on file    Allergies:  Allergies  Allergen Reactions   Amoxicillin-Pot Clavulanate Other (See Comments)    REACTION: gi upset Has patient had a PCN reaction causing immediate rash, facial/tongue/throat swelling, SOB or lightheadedness with hypotension: No Has patient had a PCN reaction causing severe rash involving mucus membranes or skin necrosis: No Has patient had a PCN reaction that required hospitalization : No Has patient had a PCN reaction occurring within the last 10 years: No If all of the above answers are "NO", then may proceed with Cephalosporin use.    Cefdinir Other (See Comments)    REACTION: gi  upset   Singulair [Montelukast Sodium] Itching        Moxifloxacin Rash    Objective:    Vital Signs:   Temp:  [97.6 F (36.4 C)-98.3 F (36.8 C)] 97.8 F (36.6 C) (09/26 0930) Pulse Rate:  [71-100] 87 (09/26 0930) Resp:  [14-25] 22 (09/26 0930) BP: (96-143)/(42-87) 119/51 (09/26 0930) SpO2:  [90 %-100 %] 97 % (09/26 0930) Weight:  [61.2 kg] 61.2 kg (09/25 1534)    Weight change: Filed Weights   06/02/22 1534  Weight: 61.2 kg    Intake/Output:   Intake/Output Summary (Last 24 hours) at 06/03/2022 0946 Last data filed at 06/03/2022 0618 Gross per 24 hour  Intake --  Output 350 ml  Net -350 ml    Physical Exam    General:  chronically ill appearing. No respiratory  difficulty HEENT: face petechiae Neck: supple. JVD ~9-10 cm. Carotids 2+ bilat; no bruits. No lymphadenopathy or thyromegaly appreciated. Cor: PMI nondisplaced. Regular rate & rhythm. No rubs, gallops. + murmur Lungs: diminished Abdomen: soft, nontender, nondistended. No hepatosplenomegaly. No bruits or masses. Good bowel sounds. Extremities: no cyanosis, clubbing, rash, +1-2 BLE edema  Neuro: alert & oriented x 3, cranial nerves grossly intact. moves all 4 extremities w/o difficulty. Affect pleasant.   Telemetry   NSR 80s  EKG    NSR 73 bpm, consider BAE (Personally reviewed)    Labs   Basic Metabolic Panel: Recent Labs  Lab 06/02/22 1411 06/02/22 1658 06/02/22 1730 06/03/22 0355  NA 145 142  --  143  K 4.3 4.2  --  4.1  CL 97  --   --  94*  CO2 41*  --   --  36*  GLUCOSE 97  --   --  125*  BUN 24*  --   --  20  CREATININE 1.46*  --   --  1.51*  CALCIUM 9.1  --   --  8.8*  MG  --   --  2.1 2.1    Liver Function Tests: Recent Labs  Lab 06/02/22 1411  AST 18  ALT 10  ALKPHOS 85  BILITOT 0.6  PROT 6.6  ALBUMIN 3.6   No results for input(s): "LIPASE", "AMYLASE" in the last 168 hours. No results for input(s): "AMMONIA" in the last 168 hours.  CBC: Recent Labs  Lab 06/02/22 1411 06/02/22 1658 06/03/22 0355  WBC 3.9*  --  3.9*  NEUTROABS 2.6  --   --   HGB 9.0* 10.2* 9.1*  HCT 27.2* 30.0* 29.5*  MCV 99.8  --  105.4*  PLT 117.0*  --  120*    Cardiac Enzymes: No results for input(s): "CKTOTAL", "CKMB", "CKMBINDEX", "TROPONINI" in the last 168 hours.  BNP: BNP (last 3 results) Recent Labs    06/02/22 1731  BNP 1,663.8*    ProBNP (last 3 results) No results for input(s): "PROBNP" in the last 8760 hours.   CBG: No results for input(s): "GLUCAP" in the last 168 hours.  Coagulation Studies: No results for input(s): "LABPROT", "INR" in the last 72 hours.   Imaging   DG Chest Port 1 View  Result Date: 06/02/2022 CLINICAL DATA:  Shortness of  breath EXAM: PORTABLE CHEST 1 VIEW COMPARISON:  08/19/2021, CT 09/10/2021 FINDINGS: Cardiomegaly with small bilateral pleural effusions. Vascular congestion and mild diffuse interstitial edema. There is emphysema. Basilar airspace disease. IMPRESSION: 1. Cardiomegaly with vascular congestion, mild interstitial edema and small bilateral effusions 2. Emphysema. 3. Basilar airspace disease, atelectasis versus pneumonia Electronically Signed   By: Donavan Foil M.D.   On: 06/02/2022 16:04     Medications:     Current Medications:  ambrisentan  10 mg Oral Daily   aspirin EC  81 mg Oral Daily   furosemide  40 mg Intravenous BID   heparin  5,000 Units Subcutaneous Q8H   hydrOXYzine  50 mg Oral QHS   levothyroxine  75 mcg Oral Daily   pantoprazole  40 mg Oral Daily   sodium chloride flush  3 mL Intravenous Q12H    Infusions:   Patient Profile   Linda Stevens is a 76 y.o.female with PMH of HTN, COPD (former smoker quit 2015), seasonal allergies, asthma, recurrent pleural effusions, pulmonary HTN, PAF, off anticoagulation 2/2 GIB and diastolic dysfxn. AHF team asked to see for a/c diastolic HF w/ RV failure and PAH.   Assessment/Plan   1. Acute on chronic diastolic HF with RV failure: - Repeat RHC 12/18 with mild PAH (mean PA 33) - Echo 5/18: with complete resolution of RV strain. No significant TR to estimate RVSP - Echo 4/19: with EF 65% no evidence of RV strain  - Echo 4/21: EF 70-75% RV normal  - Echo 11/21: EF 75% RV normal no evidence PAH - Echo 8/22: EF 60-65%, RV normal inadequate TR signal to assess PA pressure - Likely undiagnosed CTD though auto-immune panel previously negative. Does have psoriasis. Has seen Rheum  - NYHA class IIIb on admission, ReDS clip attempted, pt unable to tolerate - Volume status elevated on assessment, continue 25m IV Lasix BID - Echo pending today - Hold spiro 12.5 mg w/ AKI  - May need losartan, monitor BP for now - Iron studies stable tSat 24.3,  Ferritin 32.1   2. PAH - Suspect combination of WHO Group I and III PAH - Continue Letairis and Tadalafil.  - repeat echo pending  3. Acute on chronic Respiratory failure - Multifactorial. Continue supplemental O2.  - SOB on assessment, D-Dimer elevated, rec r/o PE - Follows with Dr. WMelvyn Novas   4. COPD-Gold III - On 3L O2 at home, up to 6L recently. 4L on assessment.  -  DuoNebs, steroid per primary. - Follows Pulmonary OP.   5.  PAF - Eliquis stopped due to severe LGIB 9/21 (hgb 3.1). Would not restart with AVMs. - Amio stopped previously - Remains in NSR   6. Cirrhosis - Due to RHF. Hepatitis serologies negative.  - AST 18, ALT 10  7. GIB - Hgb stable, no bleeding  8. AKI - Baseline ~1.2-1.4, 1.51 today - avoid hypotension  Length of Stay: [place], AGACNP-BC  06/03/2022, 9:46 AM  Advanced Heart Failure Team Pager [phone] (M-F; 7a - 5p)  Please contact [facility] for night-coverage after hours (4p -7a ) and weekends on [email or web address]  Patient seen and examined with the above-signed Advanced Practice Provider and/or Housestaff. I personally reviewed laboratory data, imaging studies and relevant notes. I independently examined the patient and formulated the important aspects of the plan. I have edited the note to reflect any of my changes or salient points. I have personally discussed the plan with the patient and/or family.  76 y/o woman as above with PAH (excellent response to vasodilators), COPD, PAF presents to ER with 3 weeks progressive SOB and cough.   CXR: cardiomegaly with vascular congestion, mild interstitial edema and small bilateral effusions, emphysema, basilar airspace disease, atelectasis versus pneumonia. Treated in ED w/ IV lasix, duonebs and solumedrol.   General:  Chronically-ill appearing. No resp difficulty HEENT: normal + telengectacias  Neck: supple. JVP to jaw Carotids 2+ bilat; no bruits. No lymphadenopathy or thryomegaly  appreciated. Cor: PMI nondisplaced. Regular rate & rhythm. No rubs, gallops or murmurs. Lungs: coarse Abdomen: soft, nontender, nondistended. No hepatosplenomegaly. No bruits or masses. Good bowel sounds. Extremities: no cyanosis, clubbing, rash, 1+ edema Neuro: alert & orientedx3, cranial nerves grossly intact. moves all 4 extremities w/o difficulty. Affect pleasant  Appears volume overloaded on exam. Agree with repeat echo. Gentle diuresis as tolerated. Continue PAH meds.   Chest CT pending.   [name], MD  1:18 PM

## 2022-06-03 NOTE — Progress Notes (Signed)
Bilateral lower extremity venous duplex has been completed. Preliminary results can be found in CV Proc through chart review.   06/03/22 10:59 AM Linda Stevens RVT

## 2022-06-03 NOTE — H&P (Signed)
History and Physical    Linda Stevens:[id number] DOB: April 08, 1946 DOA: 06/02/2022  PCP: Linda Sanders, MD   Patient coming from: Home   Chief Complaint: SOB   HPI: Linda Stevens is a 76 y.o. female with medical history significant for COPD, cirrhosis, chronic hypoxic and hypercarbic respiratory failure, pulmonary hypertension, chronic HFpEF, and paroxysmal atrial fibrillation not anticoagulated due to GI bleeding who presents for evaluation of worsening shortness of breath.  Patient reports 2 weeks of progressive dyspnea and fatigue with mild increase in her chronic cough which has been nonproductive.  She denies any chest pain, fever, or chills.  She had been on 3 L/min of supplemental oxygen but has been using 6 L recently.  She reports adherence with her diuretics.  ED Course: Upon arrival to the ED, patient is found to be afebrile and saturating 100% on 6 L/min of supplemental oxygen with mild tachypnea and systolic blood pressure of 96 and greater.  EKG features sinus rhythm and chest x-ray notable for cardiomegaly with vascular congestion, interstitial edema, small bilateral pleural effusions, and emphysematous changes.  Chemistry panel notable for bicarbonate of 41 and creatinine 1.46.  Hemoglobin is 9.0 and platelets 117,000.  BNP is elevated at 1663.  D-dimer is elevated to 2.69.  She was treated with IV Lasix, IV Solu-Medrol, and DuoNebs in the ED.  Review of Systems:  All other systems reviewed and apart from HPI, are negative.  Past Medical History:  Diagnosis Date   Acute respiratory failure with hypoxia and hypercapnea 02/26/2014   Allergic rhinitis due to pollen    Allergy    Anemia 06/12/2020   Arthritis    Asthma    CHF (congestive heart failure) (HCC)    COPD (chronic obstructive pulmonary disease) (HCC)    Diastolic dysfunction    a. 02/2014 Echo: EF 65-70%, Gr 1 DD, RVH;  b. 09/2015 Echo: EF 55%, Gr 1 DD.   Emphysema of lung (HCC)    H/O seasonal  allergies    History of chicken pox    History of tobacco abuse    a. Quit 2015.   History of UTI    Hypertension    Hypertensive heart disease    Lower extremity edema    Pericardial effusion    a. 09/2015 Echo: Mod eff w/o tamponade.   Right heart failure with reduced right ventricular function ([place])    a. 09/2015 Echo: EF 55%, Gr 1 DD, D shaped IV septum, sev dil RV with mod reduced fxn, mod dil RA, RV-RA grad 21mHg, PASP 862mg, mod pericard eff w/o tamponade.   Severe Pulmonary Hypertension    a. 09/2015 Echo: PASP 8724m.    Past Surgical History:  Procedure Laterality Date   CARDIAC CATHETERIZATION N/A 10/08/2015   Procedure: Right/Left Heart Cath and Coronary Angiography;  Surgeon: DanJolaine ArtistD;  Location: [facility];  Service: Cardiovascular;  Laterality: N/A;   CHOLECYSTECTOMY     Open   COLONOSCOPY WITH PROPOFOL N/A 06/07/2020   Procedure: COLONOSCOPY WITH PROPOFOL;  Surgeon: PyrJerene BearsD;  [facility];  Laterality: N/A;   ESOPHAGOGASTRODUODENOSCOPY (EGD) WITH PROPOFOL N/A 06/07/2020   Procedure: ESOPHAGOGASTRODUODENOSCOPY (EGD) WITH PROPOFOL;  Surgeon: PyrJerene BearsD;  [facility];  Service: Gastroenterology;  Laterality: N/A;   POLYPECTOMY  06/07/2020   Procedure: POLYPECTOMY;  Surgeon: PyrJerene BearsD;  [facility];;   RIGHT HEART CATH N/A 01/26/2017  Procedure: Right Heart Cath;  Surgeon: Jolaine Artist, MD;  Location: [facility];  Service: Cardiovascular;  Laterality: N/A;   RIGHT HEART CATH N/A 08/12/2017   Procedure: RIGHT HEART CATH;  Surgeon: Jolaine Artist, MD;  Location: [facility];  Service: Cardiovascular;  Laterality: N/A;    Social History:   reports that she quit smoking about 8 years ago. Her smoking use included cigarettes. She has a 40.00 pack-year smoking history. She has never used smokeless tobacco. She reports that  she does not drink alcohol and does not use drugs.  Allergies  Allergen Reactions   Amoxicillin-Pot Clavulanate Other (See Comments)    REACTION: gi upset Has patient had a PCN reaction causing immediate rash, facial/tongue/throat swelling, SOB or lightheadedness with hypotension: No Has patient had a PCN reaction causing severe rash involving mucus membranes or skin necrosis: No Has patient had a PCN reaction that required hospitalization : No Has patient had a PCN reaction occurring within the last 10 years: No If all of the above answers are "NO", then may proceed with Cephalosporin use.    Cefdinir Other (See Comments)    REACTION: gi  upset   Singulair [Montelukast Sodium] Itching        Moxifloxacin Rash    Family History  Problem Relation Age of Onset   Glaucoma Brother    Vascular Disease Father        Died of complications from surgery   Heart attack Father    Asthma Mother        Died of asthma attack   Dementia Mother    Hyperlipidemia Mother    Hypertension Mother      Prior to Admission medications   Medication Sig Start Date End Date Taking? Authorizing Provider  acetaminophen (TYLENOL) 500 MG tablet Take 500 mg by mouth every 4 (four) hours as needed for headache (pain).    [provider]  albuterol (VENTOLIN HFA) 108 (90 Base) MCG/ACT inhaler INHALE 2 PUFFS INTO THE LUNGS EVERY 6 HOURS AS NEEDED FOR WHEEZING/SHORTNESS OF BREATH 03/02/22   Bedsole, Amy E, MD  ambrisentan (LETAIRIS) 10 MG tablet TAKE 1 TABLET BY MOUTH 1 TIME A DAY. DO NOT HANDLE IF PREGNANT.Please make appointment for further refills 03/07/22   Bensimhon, Shaune Pascal, MD  aspirin EC 81 MG EC tablet Take 1 tablet (81 mg total) by mouth daily. 10/09/15   Debbe Odea, MD  ferrous sulfate 325 (65 FE) MG EC tablet TAKE 1 TABLET BY MOUTH TWICE A DAY WITH MEALS 01/09/22   Bedsole, Amy E, MD  fluticasone (FLONASE) 50 MCG/ACT nasal spray Place 1 spray into both nostrils daily. 08/19/21   Cobb,  Karie Schwalbe, NP  furosemide (LASIX) 40 MG tablet TAKE 1 TABLET BY MOUTH TWICE A DAY 12/26/21   Bensimhon, Shaune Pascal, MD  hydrOXYzine (ATARAX) 50 MG tablet TAKE 1/2 TO 1 TABLET BY MOUTH AT BEDTIME FOR ANXIETY 04/30/22   Bedsole, Amy E, MD  levothyroxine (SYNTHROID) 75 MCG tablet TAKE 1 TABLET BY MOUTH EVERY DAY 06/01/22   Bedsole, Amy E, MD  loratadine (CLARITIN) 10 MG tablet TAKE 1 TABLET BY MOUTH EVERY DAY 04/10/22   Cobb, Karie Schwalbe, NP  OXYGEN Inhale 4 L into the lungs continuous.    [provider]  pantoprazole (PROTONIX) 40 MG tablet Take 40 mg by mouth daily.    [provider]  polyvinyl alcohol (LIQUIFILM TEARS) 1.4 % ophthalmic solution Place 1 drop into both eyes daily  as needed for dry eyes.    [provider]  potassium chloride SA (KLOR-CON M20) 20 MEQ tablet TAKE 1 TABLET BY MOUTH EVERY DAY 06/01/22   Bedsole, Amy E, MD  spironolactone (ALDACTONE) 25 MG tablet TAKE 1/2 TABLET BY MOUTH EVERY DAY 11/28/21   Bensimhon, Shaune Pascal, MD  tadalafil (CIALIS) 20 MG tablet Take 2 tablets (40 mg total) by mouth daily. Please schedule an appointment for further refills 03/07/22   Jolaine Artist, MD    Physical Exam: Vitals:   06/02/22 2203 06/02/22 2216 06/02/22 2230 06/02/22 2357  BP: 104/87  (!) 107/51   Pulse: 77  78 83  Resp: (!) 24  (!) 21 19  Temp:  97.6 F (36.4 C)    TempSrc:  Oral    SpO2: 97%  97% 93%  Weight:      Height:        Constitutional: NAD, no diaphoresis or pallor  Eyes: PERTLA, lids and conjunctivae normal ENMT: Mucous membranes are moist. Posterior pharynx clear of any exudate or lesions.   Neck: supple, no masses  Respiratory: fine rales b/l, no wheezing. Speaking full sentences.  Cardiovascular: S1 & S2 heard, regular rate and rhythm. No extremity edema. JVD is present. Abdomen: No distension, no tenderness, soft. Bowel sounds active.  Musculoskeletal: no clubbing / cyanosis. No joint deformity upper and lower extremities.   Skin:  no significant rashes, lesions, ulcers. Warm, dry, well-perfused. Neurologic: CN 2-12 grossly intact. Moving all extremities. Alert and oriented to person, place, and situation.  Psychiatric: Anxious. Cooperative.    Labs and Imaging on Admission: I have personally reviewed following labs and imaging studies  CBC: Recent Labs  Lab 06/02/22 1411 06/02/22 1658  WBC 3.9*  --   NEUTROABS 2.6  --   HGB 9.0* 10.2*  HCT 27.2* 30.0*  MCV 99.8  --   PLT 117.0*  --    Basic Metabolic Panel: Recent Labs  Lab 06/02/22 1411 06/02/22 1658 06/02/22 1730  NA 145 142  --   K 4.3 4.2  --   CL 97  --   --   CO2 41*  --   --   GLUCOSE 97  --   --   BUN 24*  --   --   CREATININE 1.46*  --   --   CALCIUM 9.1  --   --   MG  --   --  2.1   GFR: Estimated Creatinine Clearance: 29.1 mL/min (A) (by C-G formula based on SCr of 1.46 mg/dL (H)). Liver Function Tests: Recent Labs  Lab 06/02/22 1411  AST 18  ALT 10  ALKPHOS 85  BILITOT 0.6  PROT 6.6  ALBUMIN 3.6   No results for input(s): "LIPASE", "AMYLASE" in the last 168 hours. No results for input(s): "AMMONIA" in the last 168 hours. Coagulation Profile: No results for input(s): "INR", "PROTIME" in the last 168 hours. Cardiac Enzymes: No results for input(s): "CKTOTAL", "CKMB", "CKMBINDEX", "TROPONINI" in the last 168 hours. BNP (last 3 results) No results for input(s): "PROBNP" in the last 8760 hours. HbA1C: No results for input(s): "HGBA1C" in the last 72 hours. CBG: No results for input(s): "GLUCAP" in the last 168 hours. Lipid Profile: Recent Labs    06/02/22 1411  CHOL 152  HDL 71.40  LDLCALC 63  TRIG 88.0  CHOLHDL 2   Thyroid Function Tests: Recent Labs    06/02/22 1411  TSH 4.36  FREET4 1.36  T3FREE 2.9   Anemia  Panel: Recent Labs    06/02/22 1411  FERRITIN 32.1  TIBC 280.0  IRON 68   Urine analysis:    Component Value Date/Time   COLORURINE YELLOW 05/05/2021 2154   APPEARANCEUR HAZY (A) 05/05/2021  2154   LABSPEC 1.021 05/05/2021 2154   PHURINE 5.0 05/05/2021 2154   GLUCOSEU NEGATIVE 05/05/2021 2154   GLUCOSEU NEGATIVE 07/07/2017 1625   HGBUR NEGATIVE 05/05/2021 2154   HGBUR negative 12/22/2007 [place] 05/05/2021 2154   [place] 05/05/2021 2154   PROTEINUR NEGATIVE 05/05/2021 2154   UROBILINOGEN 0.2 07/07/2017 1625   NITRITE NEGATIVE 05/05/2021 2154   LEUKOCYTESUR TRACE (A) 05/05/2021 2154   Sepsis Labs: _0 (procalcitonin:4,lacticidven:4) )No results found for this or any previous visit (from the past 240 hour(s)).   Radiological Exams on Admission: DG Chest Port 1 View  Result Date: 06/02/2022 CLINICAL DATA:  Shortness of breath EXAM: PORTABLE CHEST 1 VIEW COMPARISON:  08/19/2021, CT 09/10/2021 FINDINGS: Cardiomegaly with small bilateral pleural effusions. Vascular congestion and mild diffuse interstitial edema. There is emphysema. Basilar airspace disease. IMPRESSION: 1. Cardiomegaly with vascular congestion, mild interstitial edema and small bilateral effusions 2. Emphysema. 3. Basilar airspace disease, atelectasis versus pneumonia Electronically Signed   By: Donavan Foil M.D.   On: 06/02/2022 16:04    EKG: Independently reviewed. Sinus rhythm.   Assessment/Plan   1. Acute on chronic HFpEF; acute on chronic hypoxic & hypercarbic respiratory failure - Presents with insidiously worsening SOB over the course of weeks and is found to have JVD, edema on CXR, and increased BNP  - She was given 40 mg IV Lasix in ED  - Continue diuresis with 40 mg IV Lasix q12h, monitor wt and I/Os, update echocardiogram, and monitor renal function and electrolytes    2. COPD  - Not wheezing in ED and no sputum production to suggest exacerbation  - Continue breathing treatments    3. Pulmonary HTN  - Continue ambrisentan   4. Cirrhosis  - Appears compensated     5. CKD IIIb  - SCr is 1.46 on admission, up from 1.24 in May 2023  - Renally-dose  medications, monitor closely while diuresing   6. Anemia; thrombocytopenia  - Hgb appears stable at 10.2 on admission without overt bleeding  - Chronic thrombocytopenia worse today at 117,000 without bleeding or infection, likely related to her cirrhosis    7. Anxiety  - She is quite anxious in ED, family states this is usual for her  - Continue Atarax    DVT prophylaxis: sq heparin  Code Status: DNR  Level of Care: Level of care: Telemetry Cardiac Family Communication: Daughter updated at bedside and another daughter by phone   Disposition Plan:  Patient is from: home  Anticipated d/c is to: TBD Anticipated d/c date is: 06/06/22  Patient currently: Pending improvement in respiratory status, echocardiogram, transition back to oral medications  Consults called: none  Admission status: Inpatient     Vianne Bulls, MD [facility]  06/03/2022, 12:14 AM

## 2022-06-03 NOTE — Progress Notes (Signed)
Heart Failure Navigator Progress Note  Assessed for Heart & Vascular TOC clinic readiness.  Patient does not meet criteria due to established with AHF clinic prior to current hospitalization. Pt of Dr. Haroldine Laws.   HF Navigation will sign off.     Pricilla Holm, MSN, RN Heart Failure Nurse Navigator

## 2022-06-03 NOTE — Progress Notes (Signed)
PROGRESS NOTE    Linda Stevens  SWF:[id number] DOB: 1946-09-01 DOA: 06/02/2022 PCP: [name], MD  Chief Complaint  Patient presents with   Shortness of Breath    Brief Narrative:  Linda Stevens is Linda Stevens 76 y.o. female with medical history significant for COPD, cirrhosis, chronic hypoxic and hypercarbic respiratory failure, pulmonary hypertension, chronic HFpEF, and paroxysmal atrial fibrillation not anticoagulated due to GI bleeding who presents for evaluation of worsening shortness of breath.  Patient reports 2 weeks of progressive dyspnea and fatigue with mild increase in her chronic cough which has been nonproductive.  She denies any chest pain, fever, or chills.  She had been on 3 L/min of supplemental oxygen but has been using 6 L recently.  She reports adherence with her diuretics.  She's been admitted for Shilah Hefel heart failure exacerbation.  Assessment & Plan:   Principal Problem:   Acute on chronic diastolic CHF (congestive heart failure) (HCC) Active Problems:   Acute on chronic respiratory failure with hypoxia and hypercapnia (HCC)   Severe Pulmonary Hypertension   Hepatic cirrhosis (HCC)   Stage 3b chronic kidney disease (CKD) (HCC)   Assessment and Plan: 1. Acute on chronic HFpEF; acute on chronic hypoxic & hypercarbic respiratory failure - Presents with insidiously worsening SOB over the course of weeks and is found to have JVD, edema on CXR, and increased BNP  - CT chest with patchy infiltrates in both lungs (greater in RLL), small to moderate bilateral effusions - She was given 40 mg IV Lasix in ED  - echo with EF 70-75%, grade II diastolic dysfunction (similar to prior study - BID lasix, strict I/O, daily weights - appreciate cards assistance    # Elevated D dimer: negative LE [place], CT PE protocol negative for acute PE  # 1.7 x 0.8 cm pleural based density in the medial left upper lobe Atelectasis vs pneumonia vs neoplasm Needs f/u within 3 months, if  persistent, consider PET-CT  2. COPD  - Not wheezing in ED and no sputum production to suggest exacerbation  - Continue breathing treatments     3. Pulmonary HTN  - Continue ambrisentan    4. Cirrhosis  - Appears compensated      5. CKD IIIb  - SCr is 1.46 on admission, up from 1.24 in May 2023  - mildly up today, follow closely - Renally-dose medications, monitor closely while diuresing    6. Anemia; thrombocytopenia  - chronic, likely related to cirrhosis, follow   7. Anxiety  - She is quite anxious in ED, family states this is usual for her  - Continue Atarax   # Hx Atrial Fibrillation - anticoagulation on hold with hx prior GI bleed  # aneurysmal dilation in Descending Thoracic Aorta Follow outpatient     DVT prophylaxis: heparin Code Status: DNR Family Communication: none at bedside Disposition:   Status is: Inpatient Remains inpatient appropriate because: needs continued diuresis   Consultants:  HF team  Procedures:  Echo IMPRESSIONS     1. Left ventricular ejection fraction, by estimation, is 70 to 75%. The  left ventricle has hyperdynamic function. Left ventricular endocardial  border not optimally defined to evaluate regional wall motion. There is  moderate concentric left ventricular  hypertrophy. Left ventricular diastolic parameters are consistent with  Grade II diastolic dysfunction (pseudonormalization). Elevated left atrial  pressure.   2. Right ventricular systolic function is normal. The right ventricular  size is normal. Tricuspid regurgitation signal is inadequate for assessing  PA pressure.   3. Left atrial size was mildly dilated.   4. Shaaron Golliday small pericardial effusion is present.   5. Mitral Valve Area (MVA) = 1.74 cm2, but elevated gradients may be  related to hyperdynamic ventricle. The mitral valve is grossly normal. No  evidence of mitral valve regurgitation. Mild mitral stenosis. The mean  mitral valve gradient is 5.0 mmHg with   average heart rate of 82 bpm.   6. The aortic valve was not well visualized. Aortic valve regurgitation  is not visualized. Aortic valve sclerosis is present, with no evidence of  aortic valve stenosis.   7. The inferior vena cava is dilated in size with <50% respiratory  variability, suggesting right atrial pressure of 15 mmHg.   Comparison(s): No significant change from prior study.   Summary:  RIGHT:  - There is no evidence of deep vein thrombosis in the lower extremity.  However, portions of this examination were limited- see technologist  comments above.    - No cystic structure found in the popliteal fossa.    LEFT:  - There is no evidence of deep vein thrombosis in the lower extremity.  However, portions of this examination were limited- see technologist  comments above.    - No cystic structure found in the popliteal fossa.    Antimicrobials:  Anti-infectives (From admission, onward)    None       Subjective: No new complaints Notes SOB for last few weeks  Objective: Vitals:   06/03/22 1430 06/03/22 1500 06/03/22 1600 06/03/22 1700  BP: (!) 132/57 (!) 118/48 (!) 99/57 (!) 110/48  Pulse: 85 80 78   Resp: _0 Temp: 97.7 F (36.5 C)     TempSrc: Oral     SpO2: 94% 95% 100% 100%  Weight:      Height:        Intake/Output Summary (Last 24 hours) at 06/03/2022 1837 Last data filed at 06/03/2022 1400 Gross per 24 hour  Intake --  Output 850 ml  Net -850 ml   Filed Weights   06/02/22 1534  Weight: 61.2 kg    Examination:  General exam: Appears calm and comfortable  Respiratory system: diminished Cardiovascular system: RRR Gastrointestinal system: Abdomen is nondistended, soft and nontender. Central nervous system: Alert and oriented. No focal neurological deficits. Extremities: bilateral LE edema    Data Reviewed: I have personally reviewed following labs and imaging studies  CBC: Recent Labs  Lab 06/02/22 1411 06/02/22 1658  06/03/22 0355  WBC 3.9*  --  3.9*  NEUTROABS 2.6  --   --   HGB 9.0* 10.2* 9.1*  HCT 27.2* 30.0* 29.5*  MCV 99.8  --  105.4*  PLT 117.0*  --  120*    Basic Metabolic Panel: Recent Labs  Lab 06/02/22 1411 06/02/22 1658 06/02/22 1730 06/03/22 0355  NA 145 142  --  143  K 4.3 4.2  --  4.1  CL 97  --   --  94*  CO2 41*  --   --  36*  GLUCOSE 97  --   --  125*  BUN 24*  --   --  20  CREATININE 1.46*  --   --  1.51*  CALCIUM 9.1  --   --  8.8*  MG  --   --  2.1 2.1    GFR: Estimated Creatinine Clearance: 28.1 mL/min (Auriella Wieand) (by C-G formula based on SCr of 1.51 mg/dL (H)).  Liver Function Tests: Recent  Labs  Lab 06/02/22 1411 06/03/22 0939  AST 18 37  ALT 10 14  ALKPHOS 85 71  BILITOT 0.6 1.1  PROT 6.6 6.4*  ALBUMIN 3.6 3.2*    CBG: No results for input(s): "GLUCAP" in the last 168 hours.   No results found for this or any previous visit (from the past 240 hour(s)).       Radiology Studies: ECHOCARDIOGRAM COMPLETE  Result Date: 06/03/2022    ECHOCARDIOGRAM REPORT   Patient Name:   KEI LANGHORST Date of Exam: 06/03/2022 Medical Rec #:  [id number]           Height:       62.0 in Accession #:    [id number]          Weight:       134.9 lb Date of Birth:  1946/01/30           BSA:          1.617 m Patient Age:    25 years            BP:           119/51 mmHg Patient Gender: F                   HR:           84 bpm. Exam Location:  Inpatient Procedure: 2D Echo, Cardiac Doppler and Color Doppler Indications:    CHF-acute diastolic  History:        Patient has prior history of Echocardiogram examinations, most                 recent 04/14/2021. CHF, COPD and Pulmonary HTN, Arrythmias:Atrial                 Fibrillation, Signs/Symptoms:Shortness of Breath; Risk                 Factors:Former Smoker and Hypertension.  Sonographer:    Clayton Lefort RDCS (AE) Referring Phys: [id number] Octa  1. Left ventricular ejection fraction, by estimation, is 70 to 75%. The  left ventricle has hyperdynamic function. Left ventricular endocardial border not optimally defined to evaluate regional wall motion. There is moderate concentric left ventricular hypertrophy. Left ventricular diastolic parameters are consistent with Grade II diastolic dysfunction (pseudonormalization). Elevated left atrial pressure.  2. Right ventricular systolic function is normal. The right ventricular size is normal. Tricuspid regurgitation signal is inadequate for assessing PA pressure.  3. Left atrial size was mildly dilated.  4. Uriah Trueba small pericardial effusion is present.  5. Mitral Valve Area (MVA) = 1.74 cm2, but elevated gradients may be related to hyperdynamic ventricle. The mitral valve is grossly normal. No evidence of mitral valve regurgitation. Mild mitral stenosis. The mean mitral valve gradient is 5.0 mmHg with average heart rate of 82 bpm.  6. The aortic valve was not well visualized. Aortic valve regurgitation is not visualized. Aortic valve sclerosis is present, with no evidence of aortic valve stenosis.  7. The inferior vena cava is dilated in size with <50% respiratory variability, suggesting right atrial pressure of 15 mmHg. Comparison(s): No significant change from prior study. FINDINGS  Left Ventricle: Left ventricular ejection fraction, by estimation, is 70 to 75%. The left ventricle has hyperdynamic function. Left ventricular endocardial border not optimally defined to evaluate regional wall motion. The left ventricular internal cavity size was normal in size. There is moderate concentric left ventricular hypertrophy. Left ventricular diastolic parameters are consistent  with Grade II diastolic dysfunction (pseudonormalization). Elevated left atrial pressure. Right Ventricle: The right ventricular size is normal. No increase in right ventricular wall thickness. Right ventricular systolic function is normal. Tricuspid regurgitation signal is inadequate for assessing PA pressure. Left Atrium:  Left atrial size was mildly dilated. Right Atrium: Right atrial size was normal in size. Pericardium: Marcques Wrightsman small pericardial effusion is present. Mitral Valve: Mitral Valve Area (MVA) = 1.74 cm2, but elevated gradients may be related to hyperdynamic ventricle. The mitral valve is grossly normal. Mild mitral annular calcification. No evidence of mitral valve regurgitation. Mild mitral valve stenosis. MV peak gradient, 10.9 mmHg. The mean mitral valve gradient is 5.0 mmHg with average heart rate of 82 bpm. Tricuspid Valve: The tricuspid valve is normal in structure. Tricuspid valve regurgitation is not demonstrated. No evidence of tricuspid stenosis. Aortic Valve: The aortic valve was not well visualized. Aortic valve regurgitation is not visualized. Aortic valve sclerosis is present, with no evidence of aortic valve stenosis. Aortic valve mean gradient measures 9.0 mmHg. Aortic valve peak gradient measures 15.5 mmHg. Aortic valve area, by VTI measures 1.82 cm. Pulmonic Valve: The pulmonic valve was not well visualized. Pulmonic valve regurgitation is not visualized. Aorta: The aortic root and ascending aorta are structurally normal, with no evidence of dilitation. Venous: The inferior vena cava is dilated in size with less than 50% respiratory variability, suggesting right atrial pressure of 15 mmHg. IAS/Shunts: No atrial level shunt detected by color flow Doppler.  LEFT VENTRICLE PLAX 2D LVIDd:         4.10 cm   Diastology LVIDs:         2.60 cm   LV e' medial:    6.64 cm/s LV PW:         1.60 cm   LV E/e' medial:  20.2 LV IVS:        1.30 cm   LV e' lateral:   6.53 cm/s LVOT diam:     1.80 cm   LV E/e' lateral: 20.5 LV SV:         76 LV SV Index:   47 LVOT Area:     2.54 cm  RIGHT VENTRICLE             IVC RV Basal diam:  3.00 cm     IVC diam: 2.20 cm RV S prime:     10.40 cm/s TAPSE (M-mode): 1.4 cm LEFT ATRIUM             Index        RIGHT ATRIUM           Index LA diam:        3.80 cm 2.35 cm/m   RA Area:      16.10 cm LA Vol (A2C):   40.8 ml 25.23 ml/m  RA Volume:   31.30 ml  19.35 ml/m LA Vol (A4C):   65.7 ml 40.63 ml/m LA Biplane Vol: 52.2 ml 32.28 ml/m  AORTIC VALVE AV Area (Vmax):    1.82 cm AV Area (Vmean):   1.77 cm AV Area (VTI):     1.82 cm AV Vmax:           197.00 cm/s AV Vmean:          136.000 cm/s AV VTI:            0.419 m AV Peak Grad:      15.5 mmHg AV Mean Grad:      9.0 mmHg LVOT Vmax:  141.00 cm/s LVOT Vmean:        94.700 cm/s LVOT VTI:          0.299 m LVOT/AV VTI ratio: 0.71  AORTA Ao Root diam: 2.80 cm Ao Asc diam:  3.10 cm MITRAL VALVE MV Area (PHT): 2.77 cm     SHUNTS MV Area VTI:   1.73 cm     Systemic VTI:  0.30 m MV Peak grad:  10.9 mmHg    Systemic Diam: 1.80 cm MV Mean grad:  5.0 mmHg MV Vmax:       1.65 m/s MV Vmean:      102.0 cm/s MV Decel Time: 274 msec MV E velocity: 134.00 cm/s MV Daelon Dunivan velocity: 117.00 cm/s MV E/Authur Cubit ratio:  1.15 Rudean Haskell MD Electronically signed by Rudean Haskell MD Signature Date/Time: 06/03/2022/3:32:29 PM    Final    VAS [place] LOWER EXTREMITY VENOUS (DVT)  Result Date: 06/03/2022  Lower Venous DVT Study Patient Name:  MARJO GROSVENOR  Date of Exam:   06/03/2022 Medical Rec #: [id number]            Accession #:    [id number] Date of Birth: [birth year]            Patient Gender: F Patient Age:   31 years Exam Location:  [facility] Procedure:      VAS [place] LOWER EXTREMITY VENOUS (DVT) Referring Phys: Klaryssa Fauth POWELL JR --------------------------------------------------------------------------------  Indications: Elevated Ddimer.  Risk Factors: None identified. Limitations: Poor ultrasound/tissue interface and patient pain tolerance. Comparison Study: No prior studies. Performing Technologist: Oliver Hum RVT  Examination Guidelines: Marionna Gonia complete evaluation includes B-mode imaging, spectral Doppler, color Doppler, and power Doppler as needed of all accessible portions of each vessel. Bilateral testing is considered an integral part of Pardeep Pautz  complete examination. Limited examinations for reoccurring indications may be performed as noted. The reflux portion of the exam is performed with the patient in reverse Trendelenburg.  +---------+---------------+---------+-----------+----------+--------------+ RIGHT    CompressibilityPhasicitySpontaneityPropertiesThrombus Aging +---------+---------------+---------+-----------+----------+--------------+ CFV      Full           Yes      Yes                                 +---------+---------------+---------+-----------+----------+--------------+ SFJ      Full                                                        +---------+---------------+---------+-----------+----------+--------------+ FV Prox  Full                                                        +---------+---------------+---------+-----------+----------+--------------+ FV Mid   Full                                                        +---------+---------------+---------+-----------+----------+--------------+ FV DistalFull                                                        +---------+---------------+---------+-----------+----------+--------------+  PFV      Full                                                        +---------+---------------+---------+-----------+----------+--------------+ POP      Full           Yes      Yes                                 +---------+---------------+---------+-----------+----------+--------------+ PTV      Full                                                        +---------+---------------+---------+-----------+----------+--------------+ PERO     Full                                                        +---------+---------------+---------+-----------+----------+--------------+   +---------+---------------+---------+-----------+----------+--------------+ LEFT     CompressibilityPhasicitySpontaneityPropertiesThrombus Aging  +---------+---------------+---------+-----------+----------+--------------+ CFV      Full           Yes      Yes                                 +---------+---------------+---------+-----------+----------+--------------+ SFJ      Full                                                        +---------+---------------+---------+-----------+----------+--------------+ FV Prox  Full                                                        +---------+---------------+---------+-----------+----------+--------------+ FV Mid   Full                                                        +---------+---------------+---------+-----------+----------+--------------+ FV DistalFull                                                        +---------+---------------+---------+-----------+----------+--------------+ PFV      Full                                                        +---------+---------------+---------+-----------+----------+--------------+  POP      Full           Yes      Yes                                 +---------+---------------+---------+-----------+----------+--------------+ PTV      Full                                                        +---------+---------------+---------+-----------+----------+--------------+ PERO     Full                                                        +---------+---------------+---------+-----------+----------+--------------+     Summary: RIGHT: - There is no evidence of deep vein thrombosis in the lower extremity. However, portions of this examination were limited- see technologist comments above.  - No cystic structure found in the popliteal fossa.  LEFT: - There is no evidence of deep vein thrombosis in the lower extremity. However, portions of this examination were limited- see technologist comments above.  - No cystic structure found in the popliteal fossa.  *See table(s) above for measurements and observations.  Electronically signed by Deitra Mayo MD on 06/03/2022 at 1:08:09 PM.    Final    CT Angio Chest Pulmonary Embolism (PE) W or WO Contrast  Result Date: 06/03/2022 CLINICAL DATA:  Positive D-dimer, dyspnea on exertion EXAM: CT ANGIOGRAPHY CHEST WITH CONTRAST TECHNIQUE: Multidetector CT imaging of the chest was performed using the standard protocol during bolus administration of intravenous contrast. Multiplanar CT image reconstructions and MIPs were obtained to evaluate the vascular anatomy. RADIATION DOSE REDUCTION: This exam was performed according to the departmental dose-optimization program which includes automated exposure control, adjustment of the mA and/or kV according to patient size and/or use of iterative reconstruction technique. CONTRAST:  60m OMNIPAQUE IOHEXOL 350 MG/ML SOLN COMPARISON:  CT done on 09/10/2021, chest radiograph done on 06/02/2022 FINDINGS: Cardiovascular: There is homogeneous enhancement in thoracic aorta. There is focal ectasia of lower course of descending thoracic aorta measuring 3.2 cm. Coronary artery calcifications are seen. There is ectasia of main pulmonary artery measuring 4 cm. There are no intraluminal filling defects in pulmonary artery branches. Evaluation of small peripheral branches in the lower lung fields is limited by infiltrates. Heart is enlarged in size. Mediastinum/Nodes: There are slightly enlarged lymph nodes in mediastinum and hilar regions with no significant change. Lungs/Pleura: Centrilobular emphysema is seen. There are patchy linear infiltrates in right middle lobe, lingula and both lower lobes, more so in right lower lobe. In image 27 of series 6, there is 1.7 x 0.8 cm nodular density in the medial left upper lung field. Small to moderate bilateral pleural effusions are seen, more so on the right side. There is mild thickening of pleura in the periphery of right lower lung field. There is no pneumothorax. Upper Abdomen: There is mild nodularity  in liver surface suggesting possible cirrhosis. Musculoskeletal: There is mild decrease in height of upper endplates of bodies of TF57and T12 vertebrae with no significant interval change. Review of the MIP images confirms  the above findings. IMPRESSION: There is no evidence of pulmonary artery embolism. There is ectasia of main pulmonary artery suggesting pulmonary arterial hypertension. There is no evidence of thoracic aortic dissection. There is aneurysmal dilation in the descending thoracic aorta measuring 3.2 cm. Coronary artery disease. Cardiomegaly. COPD. Patchy infiltrates are seen in both lungs, more so in right lower lobe suggesting atelectasis/pneumonia. Small to moderate bilateral pleural effusions are seen, more so on the right side with interval increase. There is 1.7 x 0.8 cm pleural-based density in the medial left upper lobe which may suggest atelectasis/pneumonia or neoplastic process. Short-term follow-up CT chest in 3 months should be considered to re-evaluate this finding. If the density is persistent, PET-CT should be considered. Other findings as described in the body of the report. Electronically Signed   By: Elmer Picker M.D.   On: 06/03/2022 12:50   DG Chest Port 1 View  Result Date: 06/02/2022 CLINICAL DATA:  Shortness of breath EXAM: PORTABLE CHEST 1 VIEW COMPARISON:  08/19/2021, CT 09/10/2021 FINDINGS: Cardiomegaly with small bilateral pleural effusions. Vascular congestion and mild diffuse interstitial edema. There is emphysema. Basilar airspace disease. IMPRESSION: 1. Cardiomegaly with vascular congestion, mild interstitial edema and small bilateral effusions 2. Emphysema. 3. Basilar airspace disease, atelectasis versus pneumonia Electronically Signed   By: Donavan Foil M.D.   On: 06/02/2022 16:04        Scheduled Meds:  ambrisentan  10 mg Oral Daily   aspirin EC  81 mg Oral Daily   furosemide  40 mg Intravenous BID   heparin  5,000 Units Subcutaneous Q8H    hydrOXYzine  50 mg Oral QHS   levothyroxine  75 mcg Oral Daily   pantoprazole  40 mg Oral Daily   sodium chloride flush  3 mL Intravenous Q12H   Continuous Infusions:   LOS: 1 day    Time spent: over 30 min    Fayrene Helper, MD [facility]   To contact the attending provider between 7A-7P or the covering provider during after hours 7P-7A, please log into the web site [email or web address] and access using universal  password for that web site. If you do not have the password, please call the hospital operator.  06/03/2022, 6:37 PM

## 2022-06-04 DIAGNOSIS — I5033 Acute on chronic diastolic (congestive) heart failure: Secondary | ICD-10-CM | POA: Diagnosis not present

## 2022-06-04 LAB — BASIC METABOLIC PANEL
Anion gap: 10 (ref 5–15)
BUN: 31 mg/dL — ABNORMAL HIGH (ref 8–23)
CO2: 38 mmol/L — ABNORMAL HIGH (ref 22–32)
Calcium: 8.4 mg/dL — ABNORMAL LOW (ref 8.9–10.3)
Chloride: 94 mmol/L — ABNORMAL LOW (ref 98–111)
Creatinine, Ser: 1.71 mg/dL — ABNORMAL HIGH (ref 0.44–1.00)
GFR, Estimated: 31 mL/min — ABNORMAL LOW (ref >60)
Glucose, Bld: 100 mg/dL — ABNORMAL HIGH (ref 70–99)
Potassium: 4 mmol/L (ref 3.5–5.1)
Sodium: 142 mmol/L (ref 135–145)

## 2022-06-04 LAB — CBC WITH DIFFERENTIAL/PLATELET
Abs Immature Granulocytes: 0.01 10*3/uL (ref 0.00–0.07)
Basophils Absolute: 0 10*3/uL (ref 0.0–0.1)
Basophils Relative: 0 %
Eosinophils Absolute: 0 10*3/uL (ref 0.0–0.5)
Eosinophils Relative: 0 %
HCT: 28.3 % — ABNORMAL LOW (ref 36.0–46.0)
Hemoglobin: 8.7 g/dL — ABNORMAL LOW (ref 12.0–15.0)
Immature Granulocytes: 0 %
Lymphocytes Relative: 10 %
Lymphs Abs: 0.5 10*3/uL — ABNORMAL LOW (ref 0.7–4.0)
MCH: 32 pg (ref 26.0–34.0)
MCHC: 30.7 g/dL (ref 30.0–36.0)
MCV: 104 fL — ABNORMAL HIGH (ref 80.0–100.0)
Monocytes Absolute: 0.7 10*3/uL (ref 0.1–1.0)
Monocytes Relative: 15 %
Neutro Abs: 3.3 10*3/uL (ref 1.7–7.7)
Neutrophils Relative %: 75 %
Platelets: 133 10*3/uL — ABNORMAL LOW (ref 150–400)
RBC: 2.72 MIL/uL — ABNORMAL LOW (ref 3.87–5.11)
RDW: 17.8 % — ABNORMAL HIGH (ref 11.5–15.5)
WBC: 4.5 10*3/uL (ref 4.0–10.5)
nRBC: 0 % (ref 0.0–0.2)

## 2022-06-04 LAB — PHOSPHORUS: Phosphorus: 3.6 mg/dL (ref 2.5–4.6)

## 2022-06-04 LAB — MAGNESIUM: Magnesium: 2.3 mg/dL (ref 1.7–2.4)

## 2022-06-04 MED ORDER — IPRATROPIUM-ALBUTEROL 0.5-2.5 (3) MG/3ML IN SOLN
3.0000 mL | Freq: Four times a day (QID) | RESPIRATORY_TRACT | Status: DC
  Administered 2022-06-04 – 2022-06-05 (×4): 3 mL via RESPIRATORY_TRACT
  Filled 2022-06-04 (×3): qty 3

## 2022-06-04 MED ORDER — POTASSIUM CHLORIDE CRYS ER 20 MEQ PO TBCR
40.0000 meq | EXTENDED_RELEASE_TABLET | Freq: Once | ORAL | Status: AC
  Administered 2022-06-04: 40 meq via ORAL
  Filled 2022-06-04: qty 2

## 2022-06-04 MED ORDER — FUROSEMIDE 10 MG/ML IJ SOLN
40.0000 mg | Freq: Once | INTRAMUSCULAR | Status: AC
  Administered 2022-06-04: 40 mg via INTRAVENOUS
  Filled 2022-06-04: qty 4

## 2022-06-04 NOTE — Progress Notes (Addendum)
Advanced Heart Failure Rounding Note  PCP-Cardiologist: [name], MD   Subjective:    Continues on IV lasix 40 BID. Is/Os incomplete, bed weights charted.  RN and patient both report brisk diuresis overnight  Scr 1.46>1.51>1.71 CO2 38 (chronically elevated) K 4.0  Reports more shortness of breath last night. Desaturations noted with ambulation.    Echo: EF 70-75%, moderate LVH, grade II DD, RV okay, mild MS with mean gradient of 5 mmHg, dilated IVC  Chest CT: No PE, evidence COPD, patchy b/l infiltrates RLL> L suggesting ATX vs pneumonia, small to moderate b/l pleural effusions ( R>L), 1.7 X 0.8 cm pleural density LUL (repeat CT chest recommended in 3 months)   Objective:   Weight Range: 61.6 kg Body mass index is 24.84 kg/m.   Vital Signs:   Temp:  [97.6 F (36.4 C)-97.9 F (36.6 C)] 97.6 F (36.4 C) (09/27 0425) Pulse Rate:  [77-96] 77 (09/27 0425) Resp:  [17-25] 17 (09/26 2000) BP: (98-132)/(41-62) 116/53 (09/27 0425) SpO2:  [92 %-100 %] 97 % (09/27 0425) Weight:  [61.6 kg] 61.6 kg (09/27 0111) Last BM Date : 06/02/22  Weight change: Filed Weights   06/02/22 1534 06/04/22 0111  Weight: 61.2 kg 61.6 kg    Intake/Output:   Intake/Output Summary (Last 24 hours) at 06/04/2022 0817 Last data filed at 06/04/2022 0100 Gross per 24 hour  Intake --  Output 750 ml  Net -750 ml      Physical Exam    General:  Chronically ill appearing elderly female HEENT: + telangiectasias Neck: Supple. JVP to jaw. Carotids 2+ bilat; no bruits.  Cor: PMI nondisplaced. Regular rate & rhythm. No rubs, gallops or murmurs. Lungs: coarse Abdomen: Soft, nontender, nondistended.  Extremities: No cyanosis, clubbing, rash, trace edema Neuro: Alert & orientedx3, cranial nerves grossly intact. moves all 4 extremities w/o difficulty. Affect pleasant   Telemetry   SR 60s-70s  Labs    CBC Recent Labs    06/02/22 1411 06/02/22 1658 06/03/22 0355 06/04/22 0055  WBC  3.9*  --  3.9* 4.5  NEUTROABS 2.6  --   --  3.3  HGB 9.0*   < > 9.1* 8.7*  HCT 27.2*   < > 29.5* 28.3*  MCV 99.8  --  105.4* 104.0*  PLT 117.0*  --  120* 133*   < > = values in this interval not displayed.   Basic Metabolic Panel Recent Labs    06/03/22 0355 06/04/22 0055  NA 143 142  K 4.1 4.0  CL 94* 94*  CO2 36* 38*  GLUCOSE 125* 100*  BUN 20 31*  CREATININE 1.51* 1.71*  CALCIUM 8.8* 8.4*  MG 2.1 2.3  PHOS  --  3.6   Liver Function Tests Recent Labs    06/02/22 1411 06/03/22 0939  AST 18 37  ALT 10 14  ALKPHOS 85 71  BILITOT 0.6 1.1  PROT 6.6 6.4*  ALBUMIN 3.6 3.2*   No results for input(s): "LIPASE", "AMYLASE" in the last 72 hours. Cardiac Enzymes No results for input(s): "CKTOTAL", "CKMB", "CKMBINDEX", "TROPONINI" in the last 72 hours.  BNP: BNP (last 3 results) Recent Labs    06/02/22 1731  BNP 1,663.8*    ProBNP (last 3 results) No results for input(s): "PROBNP" in the last 8760 hours.   D-Dimer Recent Labs    06/02/22 1731  DDIMER 2.69*   Hemoglobin A1C No results for input(s): "HGBA1C" in the last 72 hours. Fasting Lipid Panel Recent Labs  06/02/22 1411  CHOL 152  HDL 71.40  LDLCALC 63  TRIG 88.0  CHOLHDL 2   Thyroid Function Tests Recent Labs    06/02/22 1411  TSH 4.36  T3FREE 2.9    Other results:   Imaging    ECHOCARDIOGRAM COMPLETE  Result Date: 06/03/2022    ECHOCARDIOGRAM REPORT   Patient Name:   NIKIE CID Date of Exam: 06/03/2022 Medical Rec #:  [id number]           Height:       62.0 in Accession #:    [id number]          Weight:       134.9 lb Date of Birth:  [birth year]           BSA:          1.617 m Patient Age:    76 years            BP:           119/51 mmHg Patient Gender: F                   HR:           84 bpm. Exam Location:  Inpatient Procedure: 2D Echo, Cardiac Doppler and Color Doppler Indications:    CHF-acute diastolic  History:        Patient has prior history of Echocardiogram  examinations, most                 recent 04/14/2021. CHF, COPD and Pulmonary HTN, Arrythmias:Atrial                 Fibrillation, Signs/Symptoms:Shortness of Breath; Risk                 Factors:Former Smoker and Hypertension.  Sonographer:    Clayton Lefort RDCS (AE) Referring Phys: [id number] [place]  1. Left ventricular ejection fraction, by estimation, is 70 to 75%. The left ventricle has hyperdynamic function. Left ventricular endocardial border not optimally defined to evaluate regional wall motion. There is moderate concentric left ventricular hypertrophy. Left ventricular diastolic parameters are consistent with Grade II diastolic dysfunction (pseudonormalization). Elevated left atrial pressure.  2. Right ventricular systolic function is normal. The right ventricular size is normal. Tricuspid regurgitation signal is inadequate for assessing PA pressure.  3. Left atrial size was mildly dilated.  4. A small pericardial effusion is present.  5. Mitral Valve Area (MVA) = 1.74 cm2, but elevated gradients may be related to hyperdynamic ventricle. The mitral valve is grossly normal. No evidence of mitral valve regurgitation. Mild mitral stenosis. The mean mitral valve gradient is 5.0 mmHg with average heart rate of 82 bpm.  6. The aortic valve was not well visualized. Aortic valve regurgitation is not visualized. Aortic valve sclerosis is present, with no evidence of aortic valve stenosis.  7. The inferior vena cava is dilated in size with <50% respiratory variability, suggesting right atrial pressure of 15 mmHg. Comparison(s): No significant change from prior study. FINDINGS  Left Ventricle: Left ventricular ejection fraction, by estimation, is 70 to 75%. The left ventricle has hyperdynamic function. Left ventricular endocardial border not optimally defined to evaluate regional wall motion. The left ventricular internal cavity size was normal in size. There is moderate concentric left ventricular  hypertrophy. Left ventricular diastolic parameters are consistent with Grade II diastolic dysfunction (pseudonormalization). Elevated left atrial pressure. Right Ventricle: The right ventricular size is normal. No increase in right  ventricular wall thickness. Right ventricular systolic function is normal. Tricuspid regurgitation signal is inadequate for assessing PA pressure. Left Atrium: Left atrial size was mildly dilated. Right Atrium: Right atrial size was normal in size. Pericardium: A small pericardial effusion is present. Mitral Valve: Mitral Valve Area (MVA) = 1.74 cm2, but elevated gradients may be related to hyperdynamic ventricle. The mitral valve is grossly normal. Mild mitral annular calcification. No evidence of mitral valve regurgitation. Mild mitral valve stenosis. MV peak gradient, 10.9 mmHg. The mean mitral valve gradient is 5.0 mmHg with average heart rate of 82 bpm. Tricuspid Valve: The tricuspid valve is normal in structure. Tricuspid valve regurgitation is not demonstrated. No evidence of tricuspid stenosis. Aortic Valve: The aortic valve was not well visualized. Aortic valve regurgitation is not visualized. Aortic valve sclerosis is present, with no evidence of aortic valve stenosis. Aortic valve mean gradient measures 9.0 mmHg. Aortic valve peak gradient measures 15.5 mmHg. Aortic valve area, by VTI measures 1.82 cm. Pulmonic Valve: The pulmonic valve was not well visualized. Pulmonic valve regurgitation is not visualized. Aorta: The aortic root and ascending aorta are structurally normal, with no evidence of dilitation. Venous: The inferior vena cava is dilated in size with less than 50% respiratory variability, suggesting right atrial pressure of 15 mmHg. IAS/Shunts: No atrial level shunt detected by color flow Doppler.  LEFT VENTRICLE PLAX 2D LVIDd:         4.10 cm   Diastology LVIDs:         2.60 cm   LV e' medial:    6.64 cm/s LV PW:         1.60 cm   LV E/e' medial:  20.2 LV IVS:         1.30 cm   LV e' lateral:   6.53 cm/s LVOT diam:     1.80 cm   LV E/e' lateral: 20.5 LV SV:         76 LV SV Index:   47 LVOT Area:     2.54 cm  RIGHT VENTRICLE             IVC RV Basal diam:  3.00 cm     IVC diam: 2.20 cm RV S prime:     10.40 cm/s TAPSE (M-mode): 1.4 cm LEFT ATRIUM             Index        RIGHT ATRIUM           Index LA diam:        3.80 cm 2.35 cm/m   RA Area:     16.10 cm LA Vol (A2C):   40.8 ml 25.23 ml/m  RA Volume:   31.30 ml  19.35 ml/m LA Vol (A4C):   65.7 ml 40.63 ml/m LA Biplane Vol: 52.2 ml 32.28 ml/m  AORTIC VALVE AV Area (Vmax):    1.82 cm AV Area (Vmean):   1.77 cm AV Area (VTI):     1.82 cm AV Vmax:           197.00 cm/s AV Vmean:          136.000 cm/s AV VTI:            0.419 m AV Peak Grad:      15.5 mmHg AV Mean Grad:      9.0 mmHg LVOT Vmax:         141.00 cm/s LVOT Vmean:        94.700 cm/s LVOT VTI:  0.299 m LVOT/AV VTI ratio: 0.71  AORTA Ao Root diam: 2.80 cm Ao Asc diam:  3.10 cm MITRAL VALVE MV Area (PHT): 2.77 cm     SHUNTS MV Area VTI:   1.73 cm     Systemic VTI:  0.30 m MV Peak grad:  10.9 mmHg    Systemic Diam: 1.80 cm MV Mean grad:  5.0 mmHg MV Vmax:       1.65 m/s MV Vmean:      102.0 cm/s MV Decel Time: 274 msec MV E velocity: 134.00 cm/s MV A velocity: 117.00 cm/s MV E/A ratio:  1.15 Rudean Haskell MD Electronically signed by Rudean Haskell MD Signature Date/Time: 06/03/2022/3:32:29 PM    Final    VAS [place] LOWER EXTREMITY VENOUS (DVT)  Result Date: 06/03/2022  Lower Venous DVT Study Patient Name:  KENIESHA ADDERLY  Date of Exam:   06/03/2022 Medical Rec #: [id number]            Accession #:    [id number] Date of Birth: 1945/12/04            Patient Gender: F Patient Age:   12 years Exam Location:  [facility] Procedure:      VAS [place] LOWER EXTREMITY VENOUS (DVT) Referring Phys: A POWELL JR --------------------------------------------------------------------------------  Indications: Elevated Ddimer.  Risk Factors: None  identified. Limitations: Poor ultrasound/tissue interface and patient pain tolerance. Comparison Study: No prior studies. Performing Technologist: Oliver Hum RVT  Examination Guidelines: A complete evaluation includes B-mode imaging, spectral Doppler, color Doppler, and power Doppler as needed of all accessible portions of each vessel. Bilateral testing is considered an integral part of a complete examination. Limited examinations for reoccurring indications may be performed as noted. The reflux portion of the exam is performed with the patient in reverse Trendelenburg.  +---------+---------------+---------+-----------+----------+--------------+ RIGHT    CompressibilityPhasicitySpontaneityPropertiesThrombus Aging +---------+---------------+---------+-----------+----------+--------------+ CFV      Full           Yes      Yes                                 +---------+---------------+---------+-----------+----------+--------------+ SFJ      Full                                                        +---------+---------------+---------+-----------+----------+--------------+ FV Prox  Full                                                        +---------+---------------+---------+-----------+----------+--------------+ FV Mid   Full                                                        +---------+---------------+---------+-----------+----------+--------------+ FV DistalFull                                                        +---------+---------------+---------+-----------+----------+--------------+  PFV      Full                                                        +---------+---------------+---------+-----------+----------+--------------+ POP      Full           Yes      Yes                                 +---------+---------------+---------+-----------+----------+--------------+ PTV      Full                                                         +---------+---------------+---------+-----------+----------+--------------+ PERO     Full                                                        +---------+---------------+---------+-----------+----------+--------------+   +---------+---------------+---------+-----------+----------+--------------+ LEFT     CompressibilityPhasicitySpontaneityPropertiesThrombus Aging +---------+---------------+---------+-----------+----------+--------------+ CFV      Full           Yes      Yes                                 +---------+---------------+---------+-----------+----------+--------------+ SFJ      Full                                                        +---------+---------------+---------+-----------+----------+--------------+ FV Prox  Full                                                        +---------+---------------+---------+-----------+----------+--------------+ FV Mid   Full                                                        +---------+---------------+---------+-----------+----------+--------------+ FV DistalFull                                                        +---------+---------------+---------+-----------+----------+--------------+ PFV      Full                                                        +---------+---------------+---------+-----------+----------+--------------+  POP      Full           Yes      Yes                                 +---------+---------------+---------+-----------+----------+--------------+ PTV      Full                                                        +---------+---------------+---------+-----------+----------+--------------+ PERO     Full                                                        +---------+---------------+---------+-----------+----------+--------------+     Summary: RIGHT: - There is no evidence of deep vein thrombosis in the lower extremity. However, portions of this  examination were limited- see technologist comments above.  - No cystic structure found in the popliteal fossa.  LEFT: - There is no evidence of deep vein thrombosis in the lower extremity. However, portions of this examination were limited- see technologist comments above.  - No cystic structure found in the popliteal fossa.  *See table(s) above for measurements and observations. Electronically signed by Deitra Mayo MD on 06/03/2022 at 1:08:09 PM.    Final    CT Angio Chest Pulmonary Embolism (PE) W or WO Contrast  Result Date: 06/03/2022 CLINICAL DATA:  Positive D-dimer, dyspnea on exertion EXAM: CT ANGIOGRAPHY CHEST WITH CONTRAST TECHNIQUE: Multidetector CT imaging of the chest was performed using the standard protocol during bolus administration of intravenous contrast. Multiplanar CT image reconstructions and MIPs were obtained to evaluate the vascular anatomy. RADIATION DOSE REDUCTION: This exam was performed according to the departmental dose-optimization program which includes automated exposure control, adjustment of the mA and/or kV according to patient size and/or use of iterative reconstruction technique. CONTRAST:  25m OMNIPAQUE IOHEXOL 350 MG/ML SOLN COMPARISON:  CT done on 09/10/2021, chest radiograph done on 06/02/2022 FINDINGS: Cardiovascular: There is homogeneous enhancement in thoracic aorta. There is focal ectasia of lower course of descending thoracic aorta measuring 3.2 cm. Coronary artery calcifications are seen. There is ectasia of main pulmonary artery measuring 4 cm. There are no intraluminal filling defects in pulmonary artery branches. Evaluation of small peripheral branches in the lower lung fields is limited by infiltrates. Heart is enlarged in size. Mediastinum/Nodes: There are slightly enlarged lymph nodes in mediastinum and hilar regions with no significant change. Lungs/Pleura: Centrilobular emphysema is seen. There are patchy linear infiltrates in right middle lobe,  lingula and both lower lobes, more so in right lower lobe. In image 27 of series 6, there is 1.7 x 0.8 cm nodular density in the medial left upper lung field. Small to moderate bilateral pleural effusions are seen, more so on the right side. There is mild thickening of pleura in the periphery of right lower lung field. There is no pneumothorax. Upper Abdomen: There is mild nodularity in liver surface suggesting possible cirrhosis. Musculoskeletal: There is mild decrease in height of upper endplates of bodies of TY86and T12 vertebrae with no significant interval change. Review of the MIP images confirms  the above findings. IMPRESSION: There is no evidence of pulmonary artery embolism. There is ectasia of main pulmonary artery suggesting pulmonary arterial hypertension. There is no evidence of thoracic aortic dissection. There is aneurysmal dilation in the descending thoracic aorta measuring 3.2 cm. Coronary artery disease. Cardiomegaly. COPD. Patchy infiltrates are seen in both lungs, more so in right lower lobe suggesting atelectasis/pneumonia. Small to moderate bilateral pleural effusions are seen, more so on the right side with interval increase. There is 1.7 x 0.8 cm pleural-based density in the medial left upper lobe which may suggest atelectasis/pneumonia or neoplastic process. Short-term follow-up CT chest in 3 months should be considered to re-evaluate this finding. If the density is persistent, PET-CT should be considered. Other findings as described in the body of the report. Electronically Signed   By: Elmer Picker M.D.   On: 06/03/2022 12:50     Medications:     Scheduled Medications:  ambrisentan  10 mg Oral Daily   aspirin EC  81 mg Oral Daily   furosemide  40 mg Intravenous BID   heparin  5,000 Units Subcutaneous Q8H   hydrOXYzine  50 mg Oral QHS   levothyroxine  75 mcg Oral Daily   pantoprazole  40 mg Oral Daily   sodium chloride flush  3 mL Intravenous Q12H   tadalafil  40 mg  Oral Daily    Infusions:   PRN Medications: acetaminophen **OR** acetaminophen, ipratropium-albuterol, ondansetron **OR** ondansetron (ZOFRAN) IV, senna-docusate    Patient Profile   76 y.o.female with PMH of HTN, COPD (former smoker quit 2015), seasonal allergies, asthma, recurrent pleural effusions, pulmonary HTN, PAF, off anticoagulation 2/2 GIB and diastolic dysfxn. Admitted with  a/c respiratory failure with hypoxia and hypercarbia 2/2 a/c diastolic HF w/ RV failure and PAH.     Assessment/Plan   1. Acute on chronic diastolic HF with RV failure: - Repeat RHC 12/18 with mild PAH (mean PA 33) - Echo 5/18: with complete resolution of RV strain. No significant TR to estimate RVSP - Echo 4/19: with EF 65% no evidence of RV strain  - Echo 4/21: EF 70-75% RV normal  - Echo 11/21: EF 75% RV normal no evidence PAH - Echo 8/22: EF 60-65%, RV normal inadequate TR signal to assess PA pressure - Echo this admit: EF 70-75%, RV okay, inadequate TR signal to assess PA pressure - Likely undiagnosed CTD though auto-immune panel previously negative. Does have psoriasis. Has seen Rheum. - NYHA class IIIb on admission, ReDS clip attempted, pt unable to tolerate - Volume overloaded after she had decreased home diuretics from BID to daily. Diuresing with IV lasix 40 BID. Is/Os and weights not accurate. Mild AKI. Give one more dose IV lasix this afternoon and plan to transition to po diuretic tomorrow. - Holding spiro with AKI - No BP room for ARB/ARNi    2. PAH - Suspect combination of WHO Group I and III PAH - Continue Letairis and Tadalafil.  - Echo this admit as above   3. Acute on chronic Respiratory failure - Multifactorial. Continue supplemental O2. - Fluctuating O2 needs, 3-6L this admit. Desaturations with activity. - Follows with Dr. Melvyn Novas.    4. COPD-Gold III - On 3L O2 at home, recently had to increase to 5L  - Currently on 4L this am - DuoNebs, steroid per primary. - Follows  Pulmonary OP.   5.  PAF - Eliquis stopped due to severe LGIB 9/21 (hgb 3.1). Would not restart with AVMs. - Amio stopped previously -  Remains in NSR   6. Cirrhosis - Due to RHF. Hepatitis serologies negative.  - LFTs okay on 09/25   7. Hx GIB/anemia - hgb averaging 8s-9s, 8.7 today - monitor   8. AKI - Baseline Scr ~1.2-1.4,  - Scr up slightly to 1.7 today - avoid hypotension  9. Left upper lobe density - 1.7 X 0.8 cm density noted medial left upper lobe on CTA chest - Felt to represent ATX vs PNA vs neoplasm - Will need repeat imaging in 3 months    Very weak. Needing more assistance with ADLs last few weeks. PT/OT consult. May need HH PT or short-term rehab.   Length of Stay: 2  FINCH, LINDSAY N, PA-C  06/04/2022, 8:17 AM  Advanced Heart Failure Team Pager [phone] (M-F; 7a - 5p)  Please contact [facility] for night-coverage after hours (5p -7a ) and weekends on [email or web address]    Patient seen and examined with the above-signed Advanced Practice Provider and/or Housestaff. I personally reviewed laboratory data, imaging studies and relevant notes. I independently examined the patient and formulated the important aspects of the plan. I have edited the note to reflect any of my changes or salient points. I have personally discussed the plan with the patient and/or family.  Diuresing well but says she feels like she can't get enough air.   Echo with normal EF. No effusion   Sats 92-96% on O2. RR 17-24  General:  Sitting up in bed  No visual resp difficulty HEENT: normal + telengectacias Neck: supple.  JVP to jaw . Carotids 2+ bilat; no bruits. No lymphadenopathy or thryomegaly appreciated. Cor: PMI nondisplaced. Regular rate & rhythm. 2/6 TR Lungs: coarse no wheeze Abdomen: soft, nontender, nondistended. No hepatosplenomegaly. No bruits or masses. Good bowel sounds. Extremities: no cyanosis, clubbing, rash, edema Neuro: alert & orientedx3, cranial nerves grossly  intact. moves all 4 extremities w/o difficulty. Affect pleasant  Volume status improving. Echo stable but still feels SOB. Will continue IV diuresis. If symptomatically not improving can consider RHC. Remains on treatment for AECOPD per primary.   [name], MD  5:44 PM

## 2022-06-04 NOTE — Progress Notes (Signed)
PROGRESS NOTE    TYSON MASIN  UUV:[id number] DOB: [birth year] DOA: 06/02/2022 PCP: [name], MD   76/F with history of COPD, pulmonary hypertension, diastolic CHF, chronic respiratory failure on 4 to 5 L home O2, paroxysmal A-fib off anticoagulation for recurrent GI bleed scented to the ED with progressive dyspnea on exertion fatigue and cough X 2 weeks. -In the emergency room she was tachypneic, SBP was 96, O2 sats 100% on 6 L chest x-ray noted pulmonary vascular congestion, interstitial edema and small pleural effusions, hemoglobin was 9, BNP was 1663, D-dimer was elevated, CT chest noted coronary artery disease. Cardiomegaly. COPD. Patchy infiltrates in both lungs, suggesting atelectasis/pneumonia. Small to moderate bilateral pleural effusions, There is 1.7 x 0.8 cm pleural-based density in the medial left upper lobe which may suggest atelectasis/pneumonia or neoplastic process. -Echo noted EF 70-75%, moderate LVH, grade 2 DD  Subjective: Complains of shortness of breath, asking for more O2  Assessment and Plan:  Acute hypoxic respiratory failure Acute on chronic diastolic CHF ? Pulm HTN -Echo this admission EF 70-75%, moderate LVH, grade 2 diastolic dysfunction, PA pressures could not be assessed -Advanced heart failure team following -Continue IV Lasix today -Soft BP, mild AKI limiting GDMT, start SGLT2i tomorrow -Add DuoNebs -Continue tadalafil and Ambrisentan -Ambulate, PT OT eval  COPD, chronic respiratory failure -On 4 to 5 L home O2 at baseline -Add DuoNebs, no wheezing at this time, low threshold to start systemic steroids  P.Atrial fibrillation -Eliquis discontinued in 2021 for severe GI bleed from AVMs -In sinus rhythm,  Cardiac cirrhosis -Likely secondary to right heart failure, hep serologies previously negative -Check albumin/CMP with tomorrow's labs  History of chronic GI bleeds/AVM History of chronic anemia -Hemoglobin is stable, monitor  Left  upper lobe density -Incidentally noted on imaging, will need repeat CT chest in 3 to 6 months   DVT prophylaxis: Heparin subcutaneous Code Status: DNR Family Communication: This patient detail, no fam at bedside Disposition Plan: Home likely 2 to 3 days  Consultants: Advanced heart failure team   Procedures:   Antimicrobials:    Objective: Vitals:   06/04/22 0111 06/04/22 0425 06/04/22 0850 06/04/22 1028  BP: (!) 98/41 (!) 116/53 (!) 129/53   Pulse: 79 77 82 74  Resp:   19 16  Temp: 97.9 F (36.6 C) 97.6 F (36.4 C) 97.6 F (36.4 C)   TempSrc: Oral Oral Oral   SpO2: 97% 97% 94% 100%  Weight: 61.6 kg     Height:        Intake/Output Summary (Last 24 hours) at 06/04/2022 1051 Last data filed at 06/04/2022 4742 Gross per 24 hour  Intake 3 ml  Output 750 ml  Net -747 ml   Filed Weights   06/02/22 1534 06/04/22 0111  Weight: 61.2 kg 61.6 kg    Examination:  General exam: Chronically ill female sitting up in bed, AAOx3, mildly tachypneic HEENT: Positive JVD CVS: S1-S2, regular rhythm Lungs: Scattered rhonchi, no wheezes Abdomen: Soft, nontender, bowel sounds present Extremities: Trace edema Skin: No rashes Psychiatry:  Mood & affect appropriate.     Data Reviewed:   CBC: Recent Labs  Lab 06/02/22 1411 06/02/22 1658 06/03/22 0355 06/04/22 0055  WBC 3.9*  --  3.9* 4.5  NEUTROABS 2.6  --   --  3.3  HGB 9.0* 10.2* 9.1* 8.7*  HCT 27.2* 30.0* 29.5* 28.3*  MCV 99.8  --  105.4* 104.0*  PLT 117.0*  --  120* 595*   Basic Metabolic  Panel: Recent Labs  Lab 06/02/22 1411 06/02/22 1658 06/02/22 1730 06/03/22 0355 06/04/22 0055  NA 145 142  --  143 142  K 4.3 4.2  --  4.1 4.0  CL 97  --   --  94* 94*  CO2 41*  --   --  36* 38*  GLUCOSE 97  --   --  125* 100*  BUN 24*  --   --  20 31*  CREATININE 1.46*  --   --  1.51* 1.71*  CALCIUM 9.1  --   --  8.8* 8.4*  MG  --   --  2.1 2.1 2.3  PHOS  --   --   --   --  3.6   GFR: Estimated Creatinine  Clearance: 24.9 mL/min (A) (by C-G formula based on SCr of 1.71 mg/dL (H)). Liver Function Tests: Recent Labs  Lab 06/02/22 1411 06/03/22 0939  AST 18 37  ALT 10 14  ALKPHOS 85 71  BILITOT 0.6 1.1  PROT 6.6 6.4*  ALBUMIN 3.6 3.2*   No results for input(s): "LIPASE", "AMYLASE" in the last 168 hours. No results for input(s): "AMMONIA" in the last 168 hours. Coagulation Profile: Recent Labs  Lab 06/03/22 0939  INR 1.2   Cardiac Enzymes: No results for input(s): "CKTOTAL", "CKMB", "CKMBINDEX", "TROPONINI" in the last 168 hours. BNP (last 3 results) No results for input(s): "PROBNP" in the last 8760 hours. HbA1C: No results for input(s): "HGBA1C" in the last 72 hours. CBG: No results for input(s): "GLUCAP" in the last 168 hours. Lipid Profile: Recent Labs    06/02/22 1411  CHOL 152  HDL 71.40  LDLCALC 63  TRIG 88.0  CHOLHDL 2   Thyroid Function Tests: Recent Labs    06/02/22 1411  TSH 4.36  FREET4 1.36  T3FREE 2.9   Anemia Panel: Recent Labs    06/02/22 1411  FERRITIN 32.1  TIBC 280.0  IRON 68   Urine analysis:    Component Value Date/Time   COLORURINE YELLOW 05/05/2021 2154   APPEARANCEUR HAZY (A) 05/05/2021 2154   LABSPEC 1.021 05/05/2021 2154   PHURINE 5.0 05/05/2021 2154   GLUCOSEU NEGATIVE 05/05/2021 2154   GLUCOSEU NEGATIVE 07/07/2017 1625   HGBUR NEGATIVE 05/05/2021 2154   HGBUR negative 12/22/2007 1139   BILIRUBINUR NEGATIVE 05/05/2021 2154   [place] NEGATIVE 05/05/2021 2154   PROTEINUR NEGATIVE 05/05/2021 2154   UROBILINOGEN 0.2 07/07/2017 1625   NITRITE NEGATIVE 05/05/2021 2154   LEUKOCYTESUR TRACE (A) 05/05/2021 2154   Sepsis Labs: _0 (procalcitonin:4,lacticidven:4)  )No results found for this or any previous visit (from the past 240 hour(s)).   Radiology Studies: ECHOCARDIOGRAM COMPLETE  Result Date: 06/03/2022    ECHOCARDIOGRAM REPORT   Patient Name:   RUBY LOGIUDICE Date of Exam: 06/03/2022 Medical Rec #:   [id number]           Height:       62.0 in Accession #:    [id number]          Weight:       134.9 lb Date of Birth:  [birth year]           BSA:          1.617 m Patient Age:    73 years            BP:           119/51 mmHg Patient Gender: F  HR:           84 bpm. Exam Location:  Inpatient Procedure: 2D Echo, Cardiac Doppler and Color Doppler Indications:    CHF-acute diastolic  History:        Patient has prior history of Echocardiogram examinations, most                 recent 04/14/2021. CHF, COPD and Pulmonary HTN, Arrythmias:Atrial                 Fibrillation, Signs/Symptoms:Shortness of Breath; Risk                 Factors:Former Smoker and Hypertension.  Sonographer:    Clayton Lefort RDCS (AE) Referring Phys: [id number] [place]  1. Left ventricular ejection fraction, by estimation, is 70 to 75%. The left ventricle has hyperdynamic function. Left ventricular endocardial border not optimally defined to evaluate regional wall motion. There is moderate concentric left ventricular hypertrophy. Left ventricular diastolic parameters are consistent with Grade II diastolic dysfunction (pseudonormalization). Elevated left atrial pressure.  2. Right ventricular systolic function is normal. The right ventricular size is normal. Tricuspid regurgitation signal is inadequate for assessing PA pressure.  3. Left atrial size was mildly dilated.  4. A small pericardial effusion is present.  5. Mitral Valve Area (MVA) = 1.74 cm2, but elevated gradients may be related to hyperdynamic ventricle. The mitral valve is grossly normal. No evidence of mitral valve regurgitation. Mild mitral stenosis. The mean mitral valve gradient is 5.0 mmHg with average heart rate of 82 bpm.  6. The aortic valve was not well visualized. Aortic valve regurgitation is not visualized. Aortic valve sclerosis is present, with no evidence of aortic valve stenosis.  7. The inferior vena cava is dilated in size with <50% respiratory  variability, suggesting right atrial pressure of 15 mmHg. Comparison(s): No significant change from prior study. FINDINGS  Left Ventricle: Left ventricular ejection fraction, by estimation, is 70 to 75%. The left ventricle has hyperdynamic function. Left ventricular endocardial border not optimally defined to evaluate regional wall motion. The left ventricular internal cavity size was normal in size. There is moderate concentric left ventricular hypertrophy. Left ventricular diastolic parameters are consistent with Grade II diastolic dysfunction (pseudonormalization). Elevated left atrial pressure. Right Ventricle: The right ventricular size is normal. No increase in right ventricular wall thickness. Right ventricular systolic function is normal. Tricuspid regurgitation signal is inadequate for assessing PA pressure. Left Atrium: Left atrial size was mildly dilated. Right Atrium: Right atrial size was normal in size. Pericardium: A small pericardial effusion is present. Mitral Valve: Mitral Valve Area (MVA) = 1.74 cm2, but elevated gradients may be related to hyperdynamic ventricle. The mitral valve is grossly normal. Mild mitral annular calcification. No evidence of mitral valve regurgitation. Mild mitral valve stenosis. MV peak gradient, 10.9 mmHg. The mean mitral valve gradient is 5.0 mmHg with average heart rate of 82 bpm. Tricuspid Valve: The tricuspid valve is normal in structure. Tricuspid valve regurgitation is not demonstrated. No evidence of tricuspid stenosis. Aortic Valve: The aortic valve was not well visualized. Aortic valve regurgitation is not visualized. Aortic valve sclerosis is present, with no evidence of aortic valve stenosis. Aortic valve mean gradient measures 9.0 mmHg. Aortic valve peak gradient measures 15.5 mmHg. Aortic valve area, by VTI measures 1.82 cm. Pulmonic Valve: The pulmonic valve was not well visualized. Pulmonic valve regurgitation is not visualized. Aorta: The aortic root and  ascending aorta are structurally normal, with no evidence  of dilitation. Venous: The inferior vena cava is dilated in size with less than 50% respiratory variability, suggesting right atrial pressure of 15 mmHg. IAS/Shunts: No atrial level shunt detected by color flow Doppler.  LEFT VENTRICLE PLAX 2D LVIDd:         4.10 cm   Diastology LVIDs:         2.60 cm   LV e' medial:    6.64 cm/s LV PW:         1.60 cm   LV E/e' medial:  20.2 LV IVS:        1.30 cm   LV e' lateral:   6.53 cm/s LVOT diam:     1.80 cm   LV E/e' lateral: 20.5 LV SV:         76 LV SV Index:   47 LVOT Area:     2.54 cm  RIGHT VENTRICLE             IVC RV Basal diam:  3.00 cm     IVC diam: 2.20 cm RV S prime:     10.40 cm/s TAPSE (M-mode): 1.4 cm LEFT ATRIUM             Index        RIGHT ATRIUM           Index LA diam:        3.80 cm 2.35 cm/m   RA Area:     16.10 cm LA Vol (A2C):   40.8 ml 25.23 ml/m  RA Volume:   31.30 ml  19.35 ml/m LA Vol (A4C):   65.7 ml 40.63 ml/m LA Biplane Vol: 52.2 ml 32.28 ml/m  AORTIC VALVE AV Area (Vmax):    1.82 cm AV Area (Vmean):   1.77 cm AV Area (VTI):     1.82 cm AV Vmax:           197.00 cm/s AV Vmean:          136.000 cm/s AV VTI:            0.419 m AV Peak Grad:      15.5 mmHg AV Mean Grad:      9.0 mmHg LVOT Vmax:         141.00 cm/s LVOT Vmean:        94.700 cm/s LVOT VTI:          0.299 m LVOT/AV VTI ratio: 0.71  AORTA Ao Root diam: 2.80 cm Ao Asc diam:  3.10 cm MITRAL VALVE MV Area (PHT): 2.77 cm     SHUNTS MV Area VTI:   1.73 cm     Systemic VTI:  0.30 m MV Peak grad:  10.9 mmHg    Systemic Diam: 1.80 cm MV Mean grad:  5.0 mmHg MV Vmax:       1.65 m/s MV Vmean:      102.0 cm/s MV Decel Time: 274 msec MV E velocity: 134.00 cm/s MV A velocity: 117.00 cm/s MV E/A ratio:  1.15 Rudean Haskell MD Electronically signed by Rudean Haskell MD Signature Date/Time: 06/03/2022/3:32:29 PM    Final    VAS [place] LOWER EXTREMITY VENOUS (DVT)  Result Date: 06/03/2022  Lower Venous DVT Study Patient  Name:  RHIANNE SOMAN  Date of Exam:   06/03/2022 Medical Rec #: [id number]            Accession #:    [id number] Date of Birth: 06-27-1946  Patient Gender: F Patient Age:   82 years Exam Location:  [facility] Procedure:      VAS [place] LOWER EXTREMITY VENOUS (DVT) Referring Phys: A POWELL JR --------------------------------------------------------------------------------  Indications: Elevated Ddimer.  Risk Factors: None identified. Limitations: Poor ultrasound/tissue interface and patient pain tolerance. Comparison Study: No prior studies. Performing Technologist: Oliver Hum RVT  Examination Guidelines: A complete evaluation includes B-mode imaging, spectral Doppler, color Doppler, and power Doppler as needed of all accessible portions of each vessel. Bilateral testing is considered an integral part of a complete examination. Limited examinations for reoccurring indications may be performed as noted. The reflux portion of the exam is performed with the patient in reverse Trendelenburg.  +---------+---------------+---------+-----------+----------+--------------+ RIGHT    CompressibilityPhasicitySpontaneityPropertiesThrombus Aging +---------+---------------+---------+-----------+----------+--------------+ CFV      Full           Yes      Yes                                 +---------+---------------+---------+-----------+----------+--------------+ SFJ      Full                                                        +---------+---------------+---------+-----------+----------+--------------+ FV Prox  Full                                                        +---------+---------------+---------+-----------+----------+--------------+ FV Mid   Full                                                        +---------+---------------+---------+-----------+----------+--------------+ FV DistalFull                                                         +---------+---------------+---------+-----------+----------+--------------+ PFV      Full                                                        +---------+---------------+---------+-----------+----------+--------------+ POP      Full           Yes      Yes                                 +---------+---------------+---------+-----------+----------+--------------+ PTV      Full                                                        +---------+---------------+---------+-----------+----------+--------------+  PERO     Full                                                        +---------+---------------+---------+-----------+----------+--------------+   +---------+---------------+---------+-----------+----------+--------------+ LEFT     CompressibilityPhasicitySpontaneityPropertiesThrombus Aging +---------+---------------+---------+-----------+----------+--------------+ CFV      Full           Yes      Yes                                 +---------+---------------+---------+-----------+----------+--------------+ SFJ      Full                                                        +---------+---------------+---------+-----------+----------+--------------+ FV Prox  Full                                                        +---------+---------------+---------+-----------+----------+--------------+ FV Mid   Full                                                        +---------+---------------+---------+-----------+----------+--------------+ FV DistalFull                                                        +---------+---------------+---------+-----------+----------+--------------+ PFV      Full                                                        +---------+---------------+---------+-----------+----------+--------------+ POP      Full           Yes      Yes                                  +---------+---------------+---------+-----------+----------+--------------+ PTV      Full                                                        +---------+---------------+---------+-----------+----------+--------------+ PERO     Full                                                        +---------+---------------+---------+-----------+----------+--------------+  Summary: RIGHT: - There is no evidence of deep vein thrombosis in the lower extremity. However, portions of this examination were limited- see technologist comments above.  - No cystic structure found in the popliteal fossa.  LEFT: - There is no evidence of deep vein thrombosis in the lower extremity. However, portions of this examination were limited- see technologist comments above.  - No cystic structure found in the popliteal fossa.  *See table(s) above for measurements and observations. Electronically signed by Deitra Mayo MD on 06/03/2022 at 1:08:09 PM.    Final    CT Angio Chest Pulmonary Embolism (PE) W or WO Contrast  Result Date: 06/03/2022 CLINICAL DATA:  Positive D-dimer, dyspnea on exertion EXAM: CT ANGIOGRAPHY CHEST WITH CONTRAST TECHNIQUE: Multidetector CT imaging of the chest was performed using the standard protocol during bolus administration of intravenous contrast. Multiplanar CT image reconstructions and MIPs were obtained to evaluate the vascular anatomy. RADIATION DOSE REDUCTION: This exam was performed according to the departmental dose-optimization program which includes automated exposure control, adjustment of the mA and/or kV according to patient size and/or use of iterative reconstruction technique. CONTRAST:  63m OMNIPAQUE IOHEXOL 350 MG/ML SOLN COMPARISON:  CT done on 09/10/2021, chest radiograph done on 06/02/2022 FINDINGS: Cardiovascular: There is homogeneous enhancement in thoracic aorta. There is focal ectasia of lower course of descending thoracic aorta measuring 3.2 cm. Coronary artery  calcifications are seen. There is ectasia of main pulmonary artery measuring 4 cm. There are no intraluminal filling defects in pulmonary artery branches. Evaluation of small peripheral branches in the lower lung fields is limited by infiltrates. Heart is enlarged in size. Mediastinum/Nodes: There are slightly enlarged lymph nodes in mediastinum and hilar regions with no significant change. Lungs/Pleura: Centrilobular emphysema is seen. There are patchy linear infiltrates in right middle lobe, lingula and both lower lobes, more so in right lower lobe. In image 27 of series 6, there is 1.7 x 0.8 cm nodular density in the medial left upper lung field. Small to moderate bilateral pleural effusions are seen, more so on the right side. There is mild thickening of pleura in the periphery of right lower lung field. There is no pneumothorax. Upper Abdomen: There is mild nodularity in liver surface suggesting possible cirrhosis. Musculoskeletal: There is mild decrease in height of upper endplates of bodies of TK09and T12 vertebrae with no significant interval change. Review of the MIP images confirms the above findings. IMPRESSION: There is no evidence of pulmonary artery embolism. There is ectasia of main pulmonary artery suggesting pulmonary arterial hypertension. There is no evidence of thoracic aortic dissection. There is aneurysmal dilation in the descending thoracic aorta measuring 3.2 cm. Coronary artery disease. Cardiomegaly. COPD. Patchy infiltrates are seen in both lungs, more so in right lower lobe suggesting atelectasis/pneumonia. Small to moderate bilateral pleural effusions are seen, more so on the right side with interval increase. There is 1.7 x 0.8 cm pleural-based density in the medial left upper lobe which may suggest atelectasis/pneumonia or neoplastic process. Short-term follow-up CT chest in 3 months should be considered to re-evaluate this finding. If the density is persistent, PET-CT should be  considered. Other findings as described in the body of the report. Electronically Signed   By: PElmer PickerM.D.   On: 06/03/2022 12:50   DG Chest Port 1 View  Result Date: 06/02/2022 CLINICAL DATA:  Shortness of breath EXAM: PORTABLE CHEST 1 VIEW COMPARISON:  08/19/2021, CT 09/10/2021 FINDINGS: Cardiomegaly with small bilateral pleural effusions. Vascular congestion and mild  diffuse interstitial edema. There is emphysema. Basilar airspace disease. IMPRESSION: 1. Cardiomegaly with vascular congestion, mild interstitial edema and small bilateral effusions 2. Emphysema. 3. Basilar airspace disease, atelectasis versus pneumonia Electronically Signed   By: Donavan Foil M.D.   On: 06/02/2022 16:04     Scheduled Meds:  ambrisentan  10 mg Oral Daily   aspirin EC  81 mg Oral Daily   furosemide  40 mg Intravenous Once   heparin  5,000 Units Subcutaneous Q8H   hydrOXYzine  50 mg Oral QHS   ipratropium-albuterol  3 mL Nebulization QID   levothyroxine  75 mcg Oral Daily   pantoprazole  40 mg Oral Daily   sodium chloride flush  3 mL Intravenous Q12H   tadalafil  40 mg Oral Daily   Continuous Infusions:   LOS: 2 days    Time spent: 2mn  PDomenic Polite MD [facility]   06/04/2022, 10:51 AM

## 2022-06-04 NOTE — Evaluation (Signed)
Occupational Therapy Evaluation Patient Details Name: Linda Stevens MRN: [id number] DOB: 11/26/1945 Today's Date: 06/04/2022   History of Present Illness Pt is a 76 y/o female admitted for acute hypoxic respiratory failure and acute on chronic CHF work up. PMH: COPD, a fib, pulmonary HTN   Clinical Impression   PTA, pt lives with spouse, primarily stays in her recliner and transfers to [facility] due to [place]. Pt's husband has been assisting with sponge bathing/dressing though has been having difficulty managing at home. Pt presents with deficits in cardiopulmonary tolerance (requiring increased O2 to maintain sats), dynamic standing balance, strength and overall endurance. Pt able to complete bed mobility and side steps at bedside with Min A. Increased time required with cues for breathing techniques and calming strategies throughout. Pt's spouse at bedside. Began education on helpful DME at home with plans to further address. Recommend SNF rehab based on current deficits though pt may decline and opt for HHOT instead.   SpO2 97% on 4 L O2 at rest, 86% after standing at bedside HR 70-80s BP 100s/80s     Recommendations for follow up therapy are one component of a multi-disciplinary discharge planning process, led by the attending physician.  Recommendations may be updated based on patient status, additional functional criteria and insurance authorization.   Follow Up Recommendations  Skilled nursing-short term rehab (<3 hours/day) (HHOT if pt declines SNF rehab)    Assistance Recommended at Discharge Frequent or constant Supervision/Assistance  Patient can return home with the following A little help with walking and/or transfers;A lot of help with bathing/dressing/bathroom    Functional Status Assessment  Patient has had a recent decline in their functional status and demonstrates the ability to make significant improvements in function in a reasonable and predictable amount of time.   Equipment Recommendations  Wheelchair (measurements OT);Wheelchair cushion (measurements OT)    Recommendations for Other Services       Precautions / Restrictions Precautions Precautions: Fall;Other (comment) Precaution Comments: monitor O2/DOE, BP soft at baseline Restrictions Weight Bearing Restrictions: No      Mobility Bed Mobility Overal bed mobility: Needs Assistance Bed Mobility: Supine to Sit, Sit to Supine     Supine to sit: Min assist, HOB elevated Sit to supine: Min assist   General bed mobility comments: MIn A to get BLE off of bed and back on to bed    Transfers Overall transfer level: Needs assistance Equipment used: 1 person hand held assist Transfers: Sit to/from Stand Sit to Stand: Min assist           General transfer comment: steadying assist for side steps at bedside, declined RW trial at this time      Balance Overall balance assessment: Needs assistance Sitting-balance support: No upper extremity supported, Feet supported Sitting balance-Leahy Scale: Fair     Standing balance support: Single extremity supported, During functional activity Standing balance-Leahy Scale: Poor                             ADL either performed or assessed with clinical judgement   ADL Overall ADL's : Needs assistance/impaired Eating/Feeding: Set up   Grooming: Set up;Sitting   Upper Body Bathing: Minimal assistance;Sitting   Lower Body Bathing: Maximal assistance;Sitting/lateral leans;Sit to/from stand;Bed level   Upper Body Dressing : Minimal assistance;Sitting   Lower Body Dressing: Maximal assistance;Sit to/from stand;Sitting/lateral leans;Bed level       Toileting- Clothing Manipulation and Hygiene: Total assistance;Bed level Toileting -  Clothing Manipulation Details (indicate cue type and reason): use of purewick, too SOB to access [facility]       General ADL Comments: Limited by DOE,anxiety with SOB. Emphasis on breathing, calming  strategies, and energy conservation. Collab with spouse/pt on DME that may be helpful at home     Vision Ability to See in Adequate Light: 0 Adequate Patient Visual Report: No change from baseline Vision Assessment?: No apparent visual deficits     Perception     Praxis      Pertinent Vitals/Pain Pain Assessment Pain Assessment: No/denies pain     Hand Dominance Right   Extremity/Trunk Assessment Upper Extremity Assessment Upper Extremity Assessment: Generalized weakness   Lower Extremity Assessment Lower Extremity Assessment: Defer to PT evaluation   Cervical / Trunk Assessment Cervical / Trunk Assessment: Kyphotic   Communication Communication Communication: No difficulties   Cognition Arousal/Alertness: Awake/alert Behavior During Therapy: WFL for tasks assessed/performed, Anxious Overall Cognitive Status: Within Functional Limits for tasks assessed                                 General Comments: anxious with activity, anticipating SOB     General Comments  Husband present during session, endorses difficulty managing at home by himself    Exercises     Shoulder Instructions      Home Living Family/patient expects to be discharged to:: Private residence Living Arrangements: Spouse/significant other Available Help at Discharge: Family;Available 24 hours/day Type of Home: House Home Access: Stairs to enter [employer] of Steps: 1.5   Home Layout: One level               Home Equipment: BSC/3in1;Rolling Walker (2 wheels);Cane - single point;Transport chair   Additional Comments: pt's spouse having some difficulty assisting at home, other family that could help works      Prior Functioning/Environment Prior Level of Function : Needs assist             Mobility Comments: has not been ambulating much recently. stays in recliner most of the day ADLs Comments: uses BSC, husband assists with dressing/sponge bathing  recently. does not access bathroom d/t SOB        OT Problem List: Decreased strength;Decreased activity tolerance;Impaired balance (sitting and/or standing);Decreased knowledge of use of DME or AE;Cardiopulmonary status limiting activity      OT Treatment/Interventions: Self-care/ADL training;Therapeutic exercise;Energy conservation;DME and/or AE instruction;Therapeutic activities;Patient/family education    OT Goals(Current goals can be found in the care plan section) Acute Rehab OT Goals Patient Stated Goal: spouse needs for pt to be able to stand and transfer without assist, hopeful for walking as well. pt would like to decrease SOB OT Goal Formulation: With patient/family Time For Goal Achievement: 06/18/22 Potential to Achieve Goals: Good ADL Goals Pt Will Transfer to Toilet: with set-up;bedside commode;stand pivot transfer Pt Will Perform Toileting - Clothing Manipulation and hygiene: with supervision;sit to/from stand;sitting/lateral leans Additional ADL Goal #1: Pt to demonstrate implementation of pursed lip breathing and calming strategies independently during functional tasks to maintain SpO2 >90% Additional ADL Goal #2: Pt to verbalize at least 3 energy conservation strategies to implement during ADLs/transfers  OT Frequency: Min 2X/week    Co-evaluation              AM-PAC OT "6 Clicks" Daily Activity     Outcome Measure Help from another person eating meals?: A Little Help from another person taking  care of personal grooming?: A Little Help from another person toileting, which includes using toliet, bedpan, or urinal?: Total Help from another person bathing (including washing, rinsing, drying)?: A Lot Help from another person to put on and taking off regular upper body clothing?: A Little Help from another person to put on and taking off regular lower body clothing?: A Lot 6 Click Score: 14   End of Session Equipment Utilized During Treatment: Oxygen Nurse  Communication: Mobility status  Activity Tolerance: Treatment limited secondary to medical complications (Comment) Patient left: in bed;with call bell/phone within reach;with bed alarm set;with family/visitor present  OT Visit Diagnosis: Unsteadiness on feet (R26.81);Other abnormalities of gait and mobility (R26.89);Muscle weakness (generalized) (M62.81)                Time: 7017-7939 OT Time Calculation (min): 30 min Charges:  OT General Charges $OT Visit: 1 Visit OT Evaluation $OT Eval Moderate Complexity: 1 Mod OT Treatments $Therapeutic Activity: 8-22 mins  Malachy Chamber, OTR/L Acute Rehab Services Office: [phone]   Layla Maw 06/04/2022, 3:03 PM

## 2022-06-04 NOTE — TOC Initial Note (Signed)
Transition of Care [facility]) - Initial/Assessment Note    Patient Details  Name: Linda Stevens MRN: [id number] Date of Birth: [birth year]  [facility] CM/SW Contact:    Erenest Rasher, RN Phone Number: [phone] 06/04/2022, 6:51 AM  Clinical Narrative:                 HF TOC CM spoke to pt at bedside. Lives in home with husband. Husband assist in the home as needed. Pt has needed DME at home. States she had HH with [facility] and [facility]. She will decide if she wants HH again. Has scale at home. Educated pt on importance of daily weights and adhering to a low sodium heart healthy diet.   Expected Discharge Plan: [place] Barriers to Discharge: Continued Medical Work up   Patient Goals and CMS Choice   CMS Medicare.gov Compare Post Acute Care list provided to:: Patient Choice offered to / list presented to : Patient  Expected Discharge Plan and Services Expected Discharge Plan: [place]   Discharge Planning Services: CM Consult Post Acute Care Choice: [place] arrangements for the past 2 months: Single Family Home                                      Prior Living Arrangements/Services Living arrangements for the past 2 months: Single Family Home Lives with:: Spouse Patient language and need for interpreter reviewed:: Yes Do you feel safe going back to the place where you live?: Yes      Need for Family Participation in Patient Care: No (Comment) Care giver support system in place?: Yes (comment) Current home services: DME (oxygen ([employer]), transport chair, bedside commode, shower chair, rolling walker) Criminal Activity/Legal Involvement Pertinent to Current Situation/Hospitalization: No - Comment as needed  Activities of Daily Living      Permission Sought/Granted Permission sought to share information with : Case Manager, PCP, Family Supports Permission granted to share information  with : Yes, Verbal Permission Granted  Share Information with NAME: Sakina Briones  Permission granted to share info w AGENCY: [place] granted to share info w Relationship: husband  Permission granted to share info w Contact Information: [phone]  Emotional Assessment Appearance:: Appears stated age Attitude/Demeanor/Rapport: Engaged Affect (typically observed): Accepting Orientation: : Oriented to Self, Oriented to Place, Oriented to  Time, Oriented to Situation   Psych Involvement: No (comment)  Admission diagnosis:  Acute on chronic diastolic CHF (congestive heart failure) (HCC) [I50.33] Patient Active Problem List   Diagnosis Date Noted   COPD with acute exacerbation ([place]) 06/03/2022   Stage 3b chronic kidney disease (CKD) ([place]) 06/03/2022   Pulmonary emphysema (HCC)    Acute on chronic diastolic CHF (congestive heart failure) ([place]) 06/02/2022   CHF (congestive heart failure) ([place]) 05/03/2021   Onychomycosis 01/16/2021   Tinea pedis of both feet 01/16/2021   Skin rash 01/16/2021   Hypothyroid 11/23/2020   Heme positive stool    Benign neoplasm of transverse colon    Benign neoplasm of rectum    Symptomatic anemia 06/05/2020   GI bleed secondary to colonic AVMs 06/05/2020   Acute blood loss anemia 06/05/2020   AKI (acute kidney injury) ([place]) 06/05/2020   DNR (do not resuscitate)    Palliative care by specialist    Pneumonia due to infectious organism 02/19/2020   Atrial  fibrillation with RVR (HCC)    Chronic respiratory failure with hypoxia and hypercapnia (HCC) 07/08/2017   Pleural effusion, bilateral 07/08/2017   Chronic respiratory failure with hypoxia ([place]) 10/16/2015   Hepatic cirrhosis ([place]) 10/15/2015   Pulmonary artery hypertension (HCC)    Chronic diastolic CHF (congestive heart failure) ([place])    Acute on chronic respiratory failure with hypoxia and hypercapnia (HCC)    COPD GOLD II criteria but 02 dep 24/7     Severe Pulmonary  Hypertension    Pericardial effusion    Diastolic dysfunction    Goals of care, counseling/discussion 09/31/1216   Mild diastolic dysfunction 24/46/9507   Acute respiratory failure with hypoxia and hypercapnea 02/26/2014   SOB (shortness of breath) 02/22/2014   Hypokalemia 02/22/2014   HTN (hypertension) 03/19/2012   Allergic rhinitis 01/15/2007   PCP:  [name], MD Pharmacy:   [id number] [place] [id number]: [phone]: [phone] [facility] [address]Phone: [phone]: [phone]    Social Determinants of Health (SDOH) Interventions    Readmission Risk Interventions     No data to display

## 2022-06-05 DIAGNOSIS — I5033 Acute on chronic diastolic (congestive) heart failure: Secondary | ICD-10-CM | POA: Diagnosis not present

## 2022-06-05 LAB — BASIC METABOLIC PANEL
Anion gap: 9 (ref 5–15)
BUN: 36 mg/dL — ABNORMAL HIGH (ref 8–23)
CO2: 41 mmol/L — ABNORMAL HIGH (ref 22–32)
Calcium: 8.5 mg/dL — ABNORMAL LOW (ref 8.9–10.3)
Chloride: 94 mmol/L — ABNORMAL LOW (ref 98–111)
Creatinine, Ser: 1.68 mg/dL — ABNORMAL HIGH (ref 0.44–1.00)
GFR, Estimated: 32 mL/min — ABNORMAL LOW (ref >60)
Glucose, Bld: 96 mg/dL (ref 70–99)
Potassium: 3.8 mmol/L (ref 3.5–5.1)
Sodium: 144 mmol/L (ref 135–145)

## 2022-06-05 LAB — CBC
HCT: 29.9 % — ABNORMAL LOW (ref 36.0–46.0)
Hemoglobin: 9.1 g/dL — ABNORMAL LOW (ref 12.0–15.0)
MCH: 32 pg (ref 26.0–34.0)
MCHC: 30.4 g/dL (ref 30.0–36.0)
MCV: 105.3 fL — ABNORMAL HIGH (ref 80.0–100.0)
Platelets: 140 10*3/uL — ABNORMAL LOW (ref 150–400)
RBC: 2.84 MIL/uL — ABNORMAL LOW (ref 3.87–5.11)
RDW: 18 % — ABNORMAL HIGH (ref 11.5–15.5)
WBC: 5.4 10*3/uL (ref 4.0–10.5)
nRBC: 0 % (ref 0.0–0.2)

## 2022-06-05 MED ORDER — OXYMETAZOLINE HCL 0.05 % NA SOLN
1.0000 | Freq: Two times a day (BID) | NASAL | Status: DC | PRN
  Administered 2022-06-05: 1 via NASAL
  Filled 2022-06-05: qty 30

## 2022-06-05 MED ORDER — FUROSEMIDE 40 MG PO TABS: 40.0000 mg | ORAL_TABLET | Freq: Two times a day (BID) | ORAL | Status: DC

## 2022-06-05 MED ORDER — PREDNISONE 20 MG PO TABS
40.0000 mg | ORAL_TABLET | Freq: Every day | ORAL | Status: DC
  Administered 2022-06-05 – 2022-06-07 (×3): 40 mg via ORAL
  Filled 2022-06-05 (×3): qty 2

## 2022-06-05 MED ORDER — CEFDINIR 300 MG PO CAPS
300.0000 mg | ORAL_CAPSULE | Freq: Every day | ORAL | Status: DC
  Administered 2022-06-05 – 2022-06-07 (×3): 300 mg via ORAL
  Filled 2022-06-05 (×3): qty 1

## 2022-06-05 MED ORDER — IPRATROPIUM-ALBUTEROL 0.5-2.5 (3) MG/3ML IN SOLN
3.0000 mL | Freq: Three times a day (TID) | RESPIRATORY_TRACT | Status: DC
  Administered 2022-06-05 – 2022-06-06 (×3): 3 mL via RESPIRATORY_TRACT
  Filled 2022-06-05 (×4): qty 3

## 2022-06-05 MED ORDER — FUROSEMIDE 40 MG PO TABS
40.0000 mg | ORAL_TABLET | Freq: Two times a day (BID) | ORAL | Status: DC
  Administered 2022-06-06 – 2022-06-07 (×3): 40 mg via ORAL
  Filled 2022-06-05 (×3): qty 1

## 2022-06-05 MED ORDER — FUROSEMIDE 10 MG/ML IJ SOLN
40.0000 mg | Freq: Once | INTRAMUSCULAR | Status: AC
  Administered 2022-06-05: 40 mg via INTRAVENOUS
  Filled 2022-06-05: qty 4

## 2022-06-05 NOTE — Progress Notes (Addendum)
PROGRESS NOTE    Linda Stevens  ZVJ:[id number] DOB: [birth year] DOA: 06/02/2022 PCP: [name], MD   76/F with history of COPD, pulmonary hypertension, diastolic CHF, chronic respiratory failure on 4 to 5 L home O2, paroxysmal A-fib off anticoagulation for recurrent GI bleed scented to the ED with progressive dyspnea on exertion fatigue and cough X 2 weeks. -In the emergency room she was tachypneic, SBP was 96, O2 sats 100% on 6 L chest x-ray noted pulmonary vascular congestion, interstitial edema and small pleural effusions, hemoglobin was 9, BNP was 1663, D-dimer was elevated, CT chest noted coronary artery disease. Cardiomegaly. COPD. Patchy infiltrates in both lungs, suggesting atelectasis/pneumonia. Small to moderate bilateral pleural effusions, There is 1.7 x 0.8 cm pleural-based density in the medial left upper lobe which may suggest atelectasis/pneumonia or neoplastic process. -Echo noted EF 70-75%, moderate LVH, grade 2 DD  Subjective: Continues to report dyspnea in the morning  Assessment and Plan:  Acute hypoxic respiratory failure Acute on chronic diastolic CHF ? Pulm HTN -Echo this admission EF 70-75%, moderate LVH, grade 2 diastolic dysfunction, PA pressures could not be assessed -Advanced heart failure team following -Diuresed with IV Lasix X 2 days, I/O inaccurate, weight down 3lbs -Soft BP, mild AKI limiting GDMT, will consider SGLT2i if blood pressure improves -Continue tadalafil and Ambrisentan -Ambulate, PT OT eval  COPD, chronic respiratory failure -On 4 to 5 L home O2 at baseline -Continue DuoNebs, prednisone-slow taper -add cefdinir, cannot r/o Pneumonia vs Atx on CT chest  P.Atrial fibrillation -Eliquis discontinued in 2021 for severe GI bleed from AVMs -In sinus rhythm,  Cardiac cirrhosis -Likely secondary to right heart failure, hep serologies previously negative -Albumin is 3.2  History of chronic GI bleeds/AVM History of chronic  anemia -Hemoglobin is stable, monitor  Left upper lobe density -Incidentally noted on imaging, will need repeat CT chest in 3 to 6 months   DVT prophylaxis: Heparin subcutaneous Code Status: DNR Family Communication: This patient detail, no fam at bedside Disposition Plan: Home likely 2 to 3 days  Consultants: Advanced heart failure team   Procedures:   Antimicrobials:    Objective: Vitals:   06/04/22 2102 06/05/22 0030 06/05/22 0403 06/05/22 0808  BP:  (!) 95/43 (!) 114/47   Pulse:  75    Resp: _0 Temp: 97.6 F (36.4 C) 98.8 F (37.1 C) 97.7 F (36.5 C)   TempSrc: Oral Oral Oral   SpO2: 96% 100% 97% 95%  Weight:   60.1 kg   Height:        Intake/Output Summary (Last 24 hours) at 06/05/2022 1125 Last data filed at 06/05/2022 0408 Gross per 24 hour  Intake 240 ml  Output 600 ml  Net -360 ml   Filed Weights   06/02/22 1534 06/04/22 0111 06/05/22 0403  Weight: 61.2 kg 61.6 kg 60.1 kg    Examination:  General exam: Chronically ill female sitting up in bed, AAOx3, intermittently tachypneic HEENT: No JVD CVS: S1-S2, regular rhythm Lungs: Poor air movement, scattered rhonchi Abdomen: Soft, nontender, bowel sounds present Extremities: Trace edema  Skin: No rashes Psychiatry:  Mood & affect appropriate.     Data Reviewed:   CBC: Recent Labs  Lab 06/02/22 1411 06/02/22 1658 06/03/22 0355 06/04/22 0055 06/05/22 0029  WBC 3.9*  --  3.9* 4.5 5.4  NEUTROABS 2.6  --   --  3.3  --   HGB 9.0* 10.2* 9.1* 8.7* 9.1*  HCT 27.2* 30.0* 29.5* 28.3* 29.9*  MCV 99.8  --  105.4* 104.0* 105.3*  PLT 117.0*  --  120* 133* 336*   Basic Metabolic Panel: Recent Labs  Lab 06/02/22 1411 06/02/22 1658 06/02/22 1730 06/03/22 0355 06/04/22 0055 06/05/22 0029  NA 145 142  --  143 142 144  K 4.3 4.2  --  4.1 4.0 3.8  CL 97  --   --  94* 94* 94*  CO2 41*  --   --  36* 38* 41*  GLUCOSE 97  --   --  125* 100* 96  BUN 24*  --   --  20 31* 36*  CREATININE 1.46*   --   --  1.51* 1.71* 1.68*  CALCIUM 9.1  --   --  8.8* 8.4* 8.5*  MG  --   --  2.1 2.1 2.3  --   PHOS  --   --   --   --  3.6  --    GFR: Estimated Creatinine Clearance: 23.2 mL/min (A) (by C-G formula based on SCr of 1.68 mg/dL (H)). Liver Function Tests: Recent Labs  Lab 06/02/22 1411 06/03/22 0939  AST 18 37  ALT 10 14  ALKPHOS 85 71  BILITOT 0.6 1.1  PROT 6.6 6.4*  ALBUMIN 3.6 3.2*   No results for input(s): "LIPASE", "AMYLASE" in the last 168 hours. No results for input(s): "AMMONIA" in the last 168 hours. Coagulation Profile: Recent Labs  Lab 06/03/22 0939  INR 1.2   Cardiac Enzymes: No results for input(s): "CKTOTAL", "CKMB", "CKMBINDEX", "TROPONINI" in the last 168 hours. BNP (last 3 results) No results for input(s): "PROBNP" in the last 8760 hours. HbA1C: No results for input(s): "HGBA1C" in the last 72 hours. CBG: No results for input(s): "GLUCAP" in the last 168 hours. Lipid Profile: Recent Labs    06/02/22 1411  CHOL 152  HDL 71.40  LDLCALC 63  TRIG 88.0  CHOLHDL 2   Thyroid Function Tests: Recent Labs    06/02/22 1411  TSH 4.36  FREET4 1.36  T3FREE 2.9   Anemia Panel: Recent Labs    06/02/22 1411  FERRITIN 32.1  TIBC 280.0  IRON 68   Urine analysis:    Component Value Date/Time   COLORURINE YELLOW 05/05/2021 2154   APPEARANCEUR HAZY (A) 05/05/2021 2154   LABSPEC 1.021 05/05/2021 2154   PHURINE 5.0 05/05/2021 2154   GLUCOSEU NEGATIVE 05/05/2021 2154   GLUCOSEU NEGATIVE 07/07/2017 1625   HGBUR NEGATIVE 05/05/2021 2154   HGBUR negative 12/22/2007 1139   BILIRUBINUR NEGATIVE 05/05/2021 2154   [place] NEGATIVE 05/05/2021 2154   PROTEINUR NEGATIVE 05/05/2021 2154   UROBILINOGEN 0.2 07/07/2017 1625   NITRITE NEGATIVE 05/05/2021 2154   LEUKOCYTESUR TRACE (A) 05/05/2021 2154   Sepsis Labs: _0 (procalcitonin:4,lacticidven:4)  )No results found for this or any previous visit (from the past 240 hour(s)).   Radiology  Studies: ECHOCARDIOGRAM COMPLETE  Result Date: 06/03/2022    ECHOCARDIOGRAM REPORT   Patient Name:   Linda Stevens Date of Exam: 06/03/2022 Medical Rec #:  [id number]           Height:       62.0 in Accession #:    [id number]          Weight:       134.9 lb Date of Birth:  03-18-1946           BSA:          1.617 m Patient Age:    65 years  BP:           119/51 mmHg Patient Gender: F                   HR:           84 bpm. Exam Location:  Inpatient Procedure: 2D Echo, Cardiac Doppler and Color Doppler Indications:    CHF-acute diastolic  History:        Patient has prior history of Echocardiogram examinations, most                 recent 04/14/2021. CHF, COPD and Pulmonary HTN, Arrythmias:Atrial                 Fibrillation, Signs/Symptoms:Shortness of Breath; Risk                 Factors:Former Smoker and Hypertension.  Sonographer:    Clayton Lefort RDCS (AE) Referring Phys: [id number] [place]  1. Left ventricular ejection fraction, by estimation, is 70 to 75%. The left ventricle has hyperdynamic function. Left ventricular endocardial border not optimally defined to evaluate regional wall motion. There is moderate concentric left ventricular hypertrophy. Left ventricular diastolic parameters are consistent with Grade II diastolic dysfunction (pseudonormalization). Elevated left atrial pressure.  2. Right ventricular systolic function is normal. The right ventricular size is normal. Tricuspid regurgitation signal is inadequate for assessing PA pressure.  3. Left atrial size was mildly dilated.  4. A small pericardial effusion is present.  5. Mitral Valve Area (MVA) = 1.74 cm2, but elevated gradients may be related to hyperdynamic ventricle. The mitral valve is grossly normal. No evidence of mitral valve regurgitation. Mild mitral stenosis. The mean mitral valve gradient is 5.0 mmHg with average heart rate of 82 bpm.  6. The aortic valve was not well visualized. Aortic valve regurgitation  is not visualized. Aortic valve sclerosis is present, with no evidence of aortic valve stenosis.  7. The inferior vena cava is dilated in size with <50% respiratory variability, suggesting right atrial pressure of 15 mmHg. Comparison(s): No significant change from prior study. FINDINGS  Left Ventricle: Left ventricular ejection fraction, by estimation, is 70 to 75%. The left ventricle has hyperdynamic function. Left ventricular endocardial border not optimally defined to evaluate regional wall motion. The left ventricular internal cavity size was normal in size. There is moderate concentric left ventricular hypertrophy. Left ventricular diastolic parameters are consistent with Grade II diastolic dysfunction (pseudonormalization). Elevated left atrial pressure. Right Ventricle: The right ventricular size is normal. No increase in right ventricular wall thickness. Right ventricular systolic function is normal. Tricuspid regurgitation signal is inadequate for assessing PA pressure. Left Atrium: Left atrial size was mildly dilated. Right Atrium: Right atrial size was normal in size. Pericardium: A small pericardial effusion is present. Mitral Valve: Mitral Valve Area (MVA) = 1.74 cm2, but elevated gradients may be related to hyperdynamic ventricle. The mitral valve is grossly normal. Mild mitral annular calcification. No evidence of mitral valve regurgitation. Mild mitral valve stenosis. MV peak gradient, 10.9 mmHg. The mean mitral valve gradient is 5.0 mmHg with average heart rate of 82 bpm. Tricuspid Valve: The tricuspid valve is normal in structure. Tricuspid valve regurgitation is not demonstrated. No evidence of tricuspid stenosis. Aortic Valve: The aortic valve was not well visualized. Aortic valve regurgitation is not visualized. Aortic valve sclerosis is present, with no evidence of aortic valve stenosis. Aortic valve mean gradient measures 9.0 mmHg. Aortic valve peak gradient measures 15.5 mmHg. Aortic valve  area, by VTI measures 1.82 cm. Pulmonic Valve: The pulmonic valve was not well visualized. Pulmonic valve regurgitation is not visualized. Aorta: The aortic root and ascending aorta are structurally normal, with no evidence of dilitation. Venous: The inferior vena cava is dilated in size with less than 50% respiratory variability, suggesting right atrial pressure of 15 mmHg. IAS/Shunts: No atrial level shunt detected by color flow Doppler.  LEFT VENTRICLE PLAX 2D LVIDd:         4.10 cm   Diastology LVIDs:         2.60 cm   LV e' medial:    6.64 cm/s LV PW:         1.60 cm   LV E/e' medial:  20.2 LV IVS:        1.30 cm   LV e' lateral:   6.53 cm/s LVOT diam:     1.80 cm   LV E/e' lateral: 20.5 LV SV:         76 LV SV Index:   47 LVOT Area:     2.54 cm  RIGHT VENTRICLE             IVC RV Basal diam:  3.00 cm     IVC diam: 2.20 cm RV S prime:     10.40 cm/s TAPSE (M-mode): 1.4 cm LEFT ATRIUM             Index        RIGHT ATRIUM           Index LA diam:        3.80 cm 2.35 cm/m   RA Area:     16.10 cm LA Vol (A2C):   40.8 ml 25.23 ml/m  RA Volume:   31.30 ml  19.35 ml/m LA Vol (A4C):   65.7 ml 40.63 ml/m LA Biplane Vol: 52.2 ml 32.28 ml/m  AORTIC VALVE AV Area (Vmax):    1.82 cm AV Area (Vmean):   1.77 cm AV Area (VTI):     1.82 cm AV Vmax:           197.00 cm/s AV Vmean:          136.000 cm/s AV VTI:            0.419 m AV Peak Grad:      15.5 mmHg AV Mean Grad:      9.0 mmHg LVOT Vmax:         141.00 cm/s LVOT Vmean:        94.700 cm/s LVOT VTI:          0.299 m LVOT/AV VTI ratio: 0.71  AORTA Ao Root diam: 2.80 cm Ao Asc diam:  3.10 cm MITRAL VALVE MV Area (PHT): 2.77 cm     SHUNTS MV Area VTI:   1.73 cm     Systemic VTI:  0.30 m MV Peak grad:  10.9 mmHg    Systemic Diam: 1.80 cm MV Mean grad:  5.0 mmHg MV Vmax:       1.65 m/s MV Vmean:      102.0 cm/s MV Decel Time: 274 msec MV E velocity: 134.00 cm/s MV A velocity: 117.00 cm/s MV E/A ratio:  1.15 Rudean Haskell MD Electronically signed by Rudean Haskell MD Signature Date/Time: 06/03/2022/3:32:29 PM    Final    VAS [place] LOWER EXTREMITY VENOUS (DVT)  Result Date: 06/03/2022  Lower Venous DVT Study Patient Name:  Linda Stevens  Date of Exam:   06/03/2022 Medical  Rec #: [id number]            Accession #:    [id number] Date of Birth: 01/17/1946            Patient Gender: F Patient Age:   10 years Exam Location:  [facility] Procedure:      VAS [place] LOWER EXTREMITY VENOUS (DVT) Referring Phys: A POWELL JR --------------------------------------------------------------------------------  Indications: Elevated Ddimer.  Risk Factors: None identified. Limitations: Poor ultrasound/tissue interface and patient pain tolerance. Comparison Study: No prior studies. Performing Technologist: Oliver Hum RVT  Examination Guidelines: A complete evaluation includes B-mode imaging, spectral Doppler, color Doppler, and power Doppler as needed of all accessible portions of each vessel. Bilateral testing is considered an integral part of a complete examination. Limited examinations for reoccurring indications may be performed as noted. The reflux portion of the exam is performed with the patient in reverse Trendelenburg.  +---------+---------------+---------+-----------+----------+--------------+ RIGHT    CompressibilityPhasicitySpontaneityPropertiesThrombus Aging +---------+---------------+---------+-----------+----------+--------------+ CFV      Full           Yes      Yes                                 +---------+---------------+---------+-----------+----------+--------------+ SFJ      Full                                                        +---------+---------------+---------+-----------+----------+--------------+ FV Prox  Full                                                        +---------+---------------+---------+-----------+----------+--------------+ FV Mid   Full                                                         +---------+---------------+---------+-----------+----------+--------------+ FV DistalFull                                                        +---------+---------------+---------+-----------+----------+--------------+ PFV      Full                                                        +---------+---------------+---------+-----------+----------+--------------+ POP      Full           Yes      Yes                                 +---------+---------------+---------+-----------+----------+--------------+ PTV      Full                                                        +---------+---------------+---------+-----------+----------+--------------+  PERO     Full                                                        +---------+---------------+---------+-----------+----------+--------------+   +---------+---------------+---------+-----------+----------+--------------+ LEFT     CompressibilityPhasicitySpontaneityPropertiesThrombus Aging +---------+---------------+---------+-----------+----------+--------------+ CFV      Full           Yes      Yes                                 +---------+---------------+---------+-----------+----------+--------------+ SFJ      Full                                                        +---------+---------------+---------+-----------+----------+--------------+ FV Prox  Full                                                        +---------+---------------+---------+-----------+----------+--------------+ FV Mid   Full                                                        +---------+---------------+---------+-----------+----------+--------------+ FV DistalFull                                                        +---------+---------------+---------+-----------+----------+--------------+ PFV      Full                                                         +---------+---------------+---------+-----------+----------+--------------+ POP      Full           Yes      Yes                                 +---------+---------------+---------+-----------+----------+--------------+ PTV      Full                                                        +---------+---------------+---------+-----------+----------+--------------+ PERO     Full                                                        +---------+---------------+---------+-----------+----------+--------------+  Summary: RIGHT: - There is no evidence of deep vein thrombosis in the lower extremity. However, portions of this examination were limited- see technologist comments above.  - No cystic structure found in the popliteal fossa.  LEFT: - There is no evidence of deep vein thrombosis in the lower extremity. However, portions of this examination were limited- see technologist comments above.  - No cystic structure found in the popliteal fossa.  *See table(s) above for measurements and observations. Electronically signed by Deitra Mayo MD on 06/03/2022 at 1:08:09 PM.    Final    CT Angio Chest Pulmonary Embolism (PE) W or WO Contrast  Result Date: 06/03/2022 CLINICAL DATA:  Positive D-dimer, dyspnea on exertion EXAM: CT ANGIOGRAPHY CHEST WITH CONTRAST TECHNIQUE: Multidetector CT imaging of the chest was performed using the standard protocol during bolus administration of intravenous contrast. Multiplanar CT image reconstructions and MIPs were obtained to evaluate the vascular anatomy. RADIATION DOSE REDUCTION: This exam was performed according to the departmental dose-optimization program which includes automated exposure control, adjustment of the mA and/or kV according to patient size and/or use of iterative reconstruction technique. CONTRAST:  71m OMNIPAQUE IOHEXOL 350 MG/ML SOLN COMPARISON:  CT done on 09/10/2021, chest radiograph done on 06/02/2022 FINDINGS: Cardiovascular: There  is homogeneous enhancement in thoracic aorta. There is focal ectasia of lower course of descending thoracic aorta measuring 3.2 cm. Coronary artery calcifications are seen. There is ectasia of main pulmonary artery measuring 4 cm. There are no intraluminal filling defects in pulmonary artery branches. Evaluation of small peripheral branches in the lower lung fields is limited by infiltrates. Heart is enlarged in size. Mediastinum/Nodes: There are slightly enlarged lymph nodes in mediastinum and hilar regions with no significant change. Lungs/Pleura: Centrilobular emphysema is seen. There are patchy linear infiltrates in right middle lobe, lingula and both lower lobes, more so in right lower lobe. In image 27 of series 6, there is 1.7 x 0.8 cm nodular density in the medial left upper lung field. Small to moderate bilateral pleural effusions are seen, more so on the right side. There is mild thickening of pleura in the periphery of right lower lung field. There is no pneumothorax. Upper Abdomen: There is mild nodularity in liver surface suggesting possible cirrhosis. Musculoskeletal: There is mild decrease in height of upper endplates of bodies of TY65and T12 vertebrae with no significant interval change. Review of the MIP images confirms the above findings. IMPRESSION: There is no evidence of pulmonary artery embolism. There is ectasia of main pulmonary artery suggesting pulmonary arterial hypertension. There is no evidence of thoracic aortic dissection. There is aneurysmal dilation in the descending thoracic aorta measuring 3.2 cm. Coronary artery disease. Cardiomegaly. COPD. Patchy infiltrates are seen in both lungs, more so in right lower lobe suggesting atelectasis/pneumonia. Small to moderate bilateral pleural effusions are seen, more so on the right side with interval increase. There is 1.7 x 0.8 cm pleural-based density in the medial left upper lobe which may suggest atelectasis/pneumonia or neoplastic  process. Short-term follow-up CT chest in 3 months should be considered to re-evaluate this finding. If the density is persistent, PET-CT should be considered. Other findings as described in the body of the report. Electronically Signed   By: PElmer PickerM.D.   On: 06/03/2022 12:50   DG Chest Port 1 View  Result Date: 06/02/2022 CLINICAL DATA:  Shortness of breath EXAM: PORTABLE CHEST 1 VIEW COMPARISON:  08/19/2021, CT 09/10/2021 FINDINGS: Cardiomegaly with small bilateral pleural effusions. Vascular congestion and mild  diffuse interstitial edema. There is emphysema. Basilar airspace disease. IMPRESSION: 1. Cardiomegaly with vascular congestion, mild interstitial edema and small bilateral effusions 2. Emphysema. 3. Basilar airspace disease, atelectasis versus pneumonia Electronically Signed   By: Donavan Foil M.D.   On: 06/02/2022 16:04     Scheduled Meds:  ambrisentan  10 mg Oral Daily   aspirin EC  81 mg Oral Daily   furosemide  40 mg Oral BID   heparin  5,000 Units Subcutaneous Q8H   hydrOXYzine  50 mg Oral QHS   ipratropium-albuterol  3 mL Nebulization TID   levothyroxine  75 mcg Oral Daily   pantoprazole  40 mg Oral Daily   predniSONE  40 mg Oral Q breakfast   sodium chloride flush  3 mL Intravenous Q12H   tadalafil  40 mg Oral Daily   Continuous Infusions:   LOS: 3 days    Time spent: 41mn  PDomenic Polite MD [facility]   06/05/2022, 11:25 AM

## 2022-06-05 NOTE — Evaluation (Signed)
Physical Therapy Evaluation Patient Details Name: Linda Stevens MRN: [id number] DOB: 1945/09/16 Today's Date: 06/05/2022  History of Present Illness  Pt is a 76 y/o female admitted for acute hypoxic respiratory failure and acute on chronic CHF work up. PMH: COPD, a fib, pulmonary HTN  Clinical Impression  Pt presents to PT with deficits in activity tolerance, pulmonary funciton, power, gait. Pt expresses anxiety over difficulty breathing at rest, despite saturation stable in 90s on 4LNC when resting. PT encourages pursed lip breathing and provides reassurance. Pt is able to mobilize in bed and stand without physical assistance at this time, however activity tolerance is poor. At baseline pt reports having some days where she does not ambulate and only transfers from couch to bedside commode and back. If spouse is able to intermittently assist PT feels the most appropriate PT follow-up is HHPT. Acute PT will continue to follow in an effort to improve endurance.       Recommendations for follow up therapy are one component of a multi-disciplinary discharge planning process, led by the attending physician.  Recommendations may be updated based on patient status, additional functional criteria and insurance authorization.  Follow Up Recommendations Home health PT      Assistance Recommended at Discharge Intermittent Supervision/Assistance  Patient can return home with the following  A little help with walking and/or transfers;A little help with bathing/dressing/bathroom;Assistance with cooking/housework;Assist for transportation;Help with stairs or ramp for entrance    Equipment Recommendations  (TBD, may benefit from rollator if able to progress to ambulation)  Recommendations for Other Services       Functional Status Assessment Patient has had a recent decline in their functional status and demonstrates the ability to make significant improvements in function in a reasonable and  predictable amount of time.     Precautions / Restrictions Precautions Precautions: Fall;Other (comment) Precaution Comments: monitor O2/DOE, BP soft at baseline Restrictions Weight Bearing Restrictions: No      Mobility  Bed Mobility Overal bed mobility: Needs Assistance Bed Mobility: Supine to Sit, Sit to Supine     Supine to sit: Supervision, HOB elevated Sit to supine: Supervision        Transfers Overall transfer level: Needs assistance Equipment used: None Transfers: Sit to/from Stand Sit to Stand: Min guard           General transfer comment: poor tolerance for standing    Ambulation/Gait                  Stairs            Wheelchair Mobility    Modified Rankin (Stroke Patients Only)       Balance Overall balance assessment: Needs assistance Sitting-balance support: No upper extremity supported, Feet supported Sitting balance-Leahy Scale: Fair     Standing balance support: No upper extremity supported, During functional activity Standing balance-Leahy Scale: Fair Standing balance comment: brief period of standing                             Pertinent Vitals/Pain Pain Assessment Pain Assessment: No/denies pain    Home Living Family/patient expects to be discharged to:: Private residence Living Arrangements: Spouse/significant other Available Help at Discharge: Family;Available 24 hours/day Type of Home: House Home Access: Stairs to enter Entrance Stairs-Rails: Right Entrance Stairs-Number of Steps: 1.5   Home Layout: One level Home Equipment: BSC/3in1;Rolling Walker (2 wheels);Cane - single point;Transport chair Additional Comments: pt's spouse having some  difficulty assisting at home, other family that could help works    Prior Function Prior Level of Function : Needs assist             Mobility Comments: has not been ambulating much recently. stays in recliner most of the day. occasionally walks to sink  to bathe ADLs Comments: uses BSC, husband assists with dressing/sponge bathing recently. does not access bathroom d/t SOB     Hand Dominance   Dominant Hand: Right    Extremity/Trunk Assessment   Upper Extremity Assessment Upper Extremity Assessment: Generalized weakness    Lower Extremity Assessment Lower Extremity Assessment: Generalized weakness    Cervical / Trunk Assessment Cervical / Trunk Assessment: Kyphotic  Communication   Communication: No difficulties  Cognition Arousal/Alertness: Awake/alert Behavior During Therapy: Anxious Overall Cognitive Status: Within Functional Limits for tasks assessed                                          General Comments General comments (skin integrity, edema, etc.): pt on 4L [place] initially, pt is very anxious, reports dyspnea although sats stable at rest. After mobilizing sats drop to mid-80s. PT increases supplemental oxygen to 5L [place] for recovery as the pt reports utilizing 5L PR[place] at home. Pt recovers back to 90s within ~2 minutes when resting on 5L, PT weans back to 4L prior to departure.    Exercises     Assessment/Plan    PT Assessment Patient needs continued PT services  PT Problem List Decreased strength;Decreased balance;Decreased activity tolerance;Decreased mobility;Cardiopulmonary status limiting activity       PT Treatment Interventions DME instruction;Gait training;Functional mobility training;Therapeutic activities;Therapeutic exercise;Balance training;Neuromuscular re-education;Patient/family education    PT Goals (Current goals can be found in the Care Plan section)  Acute Rehab PT Goals Patient Stated Goal: to improve pulmonary function and activity tolerance PT Goal Formulation: With patient Time For Goal Achievement: 06/19/22 Potential to Achieve Goals: Fair    Frequency Min 3X/week     Co-evaluation               AM-PAC PT "6 Clicks" Mobility  Outcome Measure Help needed  turning from your back to your side while in a flat bed without using bedrails?: A Little Help needed moving from lying on your back to sitting on the side of a flat bed without using bedrails?: A Little Help needed moving to and from a bed to a chair (including a wheelchair)?: A Little Help needed standing up from a chair using your arms (e.g., wheelchair or bedside chair)?: A Little Help needed to walk in hospital room?: Total Help needed climbing 3-5 steps with a railing? : Total 6 Click Score: 14    End of Session Equipment Utilized During Treatment: Oxygen Activity Tolerance: Patient limited by fatigue Patient left: in bed;with call bell/phone within reach;with bed alarm set Nurse Communication: Mobility status PT Visit Diagnosis: Other abnormalities of gait and mobility (R26.89)    Time: 0539-7673 PT Time Calculation (min) (ACUTE ONLY): 32 min   Charges:   PT Evaluation $PT Eval Low Complexity: [place], PT, DPT Acute Rehabilitation Office [phone]   Zenaida Niece 06/05/2022, 9:48 AM

## 2022-06-05 NOTE — Progress Notes (Addendum)
Advanced Heart Failure Rounding Note  PCP-Cardiologist: [name], MD   Subjective:    Last dose of IV lasix was yesterday. Is/Os inaccurate, frequently wets bed. Down 3lbs.   Scr 1.46>1.51>1.71>1.68 CO2 41 (chronically elevated) K 3.8  Feels better this morning. Working with PT during assessment. Able to briefly stand at EOB. On 4L [place]. Denies CP.   Echo: EF 70-75%, moderate LVH, grade II DD, RV okay, mild MS with mean gradient of 5 mmHg, dilated IVC  Chest CT: No PE, evidence COPD, patchy b/l infiltrates RLL> L suggesting ATX vs pneumonia, small to moderate b/l pleural effusions ( R>L), 1.7 X 0.8 cm pleural density LUL (repeat CT chest recommended in 3 months)  Objective:   Weight Range: 60.1 kg Body mass index is 24.23 kg/m.   Vital Signs:   Temp:  [97.5 F (36.4 C)-98.8 F (37.1 C)] 97.7 F (36.5 C) (09/28 0403) Pulse Rate:  [68-75] 75 (09/28 0030) Resp:  [15-20] 20 (09/28 0403) BP: (95-114)/(43-48) 114/47 (09/28 0403) SpO2:  [95 %-100 %] 95 % (09/28 0808) FiO2 (%):  [28 %] 28 % (09/27 1545) Weight:  [60.1 kg] 60.1 kg (09/28 0403) Last BM Date : 06/02/22  Weight change: Filed Weights   06/02/22 1534 06/04/22 0111 06/05/22 0403  Weight: 61.2 kg 61.6 kg 60.1 kg    Intake/Output:   Intake/Output Summary (Last 24 hours) at 06/05/2022 0910 Last data filed at 06/05/2022 0408 Gross per 24 hour  Intake 240 ml  Output 600 ml  Net -360 ml    Physical Exam    General:  chronically ill, weak appearing. +cough HEENT: + telangiectasias  Neck: supple. JVD ~8 cm. Carotids 2+ bilat; no bruits. No lymphadenopathy or thyromegaly appreciated. Cor: PMI nondisplaced. Regular rate & rhythm. No rubs, gallops or murmurs. Lungs: diminished. Expiratory wheezes L>R.  Abdomen: soft, nontender, nondistended. No hepatosplenomegaly. No bruits or masses. Good bowel sounds. Extremities: no cyanosis, clubbing, rash, non-pitting BLE edema  Neuro: alert & oriented x 3, cranial  nerves grossly intact. moves all 4 extremities w/o difficulty. Affect pleasant.   Telemetry   NSR 80s (Personally reviewed)    Labs    CBC Recent Labs    06/02/22 1411 06/02/22 1658 06/04/22 0055 06/05/22 0029  WBC 3.9*   < > 4.5 5.4  NEUTROABS 2.6  --  3.3  --   HGB 9.0*   < > 8.7* 9.1*  HCT 27.2*   < > 28.3* 29.9*  MCV 99.8   < > 104.0* 105.3*  PLT 117.0*   < > 133* 140*   < > = values in this interval not displayed.   Basic Metabolic Panel Recent Labs    06/03/22 0355 06/04/22 0055 06/05/22 0029  NA 143 142 144  K 4.1 4.0 3.8  CL 94* 94* 94*  CO2 36* 38* 41*  GLUCOSE 125* 100* 96  BUN 20 31* 36*  CREATININE 1.51* 1.71* 1.68*  CALCIUM 8.8* 8.4* 8.5*  MG 2.1 2.3  --   PHOS  --  3.6  --    Liver Function Tests Recent Labs    06/02/22 1411 06/03/22 0939  AST 18 37  ALT 10 14  ALKPHOS 85 71  BILITOT 0.6 1.1  PROT 6.6 6.4*  ALBUMIN 3.6 3.2*   No results for input(s): "LIPASE", "AMYLASE" in the last 72 hours. Cardiac Enzymes No results for input(s): "CKTOTAL", "CKMB", "CKMBINDEX", "TROPONINI" in the last 72 hours.  BNP: BNP (last 3 results) Recent Labs  06/02/22 1731  BNP 1,663.8*    ProBNP (last 3 results) No results for input(s): "PROBNP" in the last 8760 hours.   D-Dimer Recent Labs    06/02/22 1731  DDIMER 2.69*   Hemoglobin A1C No results for input(s): "HGBA1C" in the last 72 hours. Fasting Lipid Panel Recent Labs    06/02/22 1411  CHOL 152  HDL 71.40  LDLCALC 63  TRIG 88.0  CHOLHDL 2   Thyroid Function Tests Recent Labs    06/02/22 1411  TSH 4.36  T3FREE 2.9    Other results:   Imaging    No results found.   Medications:     Scheduled Medications:  ambrisentan  10 mg Oral Daily   aspirin EC  81 mg Oral Daily   heparin  5,000 Units Subcutaneous Q8H   hydrOXYzine  50 mg Oral QHS   ipratropium-albuterol  3 mL Nebulization TID   levothyroxine  75 mcg Oral Daily   pantoprazole  40 mg Oral Daily    predniSONE  40 mg Oral Q breakfast   sodium chloride flush  3 mL Intravenous Q12H   tadalafil  40 mg Oral Daily    Infusions:   PRN Medications: acetaminophen **OR** acetaminophen, ondansetron **OR** ondansetron (ZOFRAN) IV, senna-docusate    Patient Profile   76 y.o.female with PMH of HTN, COPD (former smoker quit 2015), seasonal allergies, asthma, recurrent pleural effusions, pulmonary HTN, PAF, off anticoagulation 2/2 GIB and diastolic dysfxn. Admitted with  a/c respiratory failure with hypoxia and hypercarbia 2/2 a/c diastolic HF w/ RV failure and PAH.     Assessment/Plan   1. Acute on chronic diastolic HF with RV failure: - Repeat RHC 12/18 with mild PAH (mean PA 33) - Echo 5/18: with complete resolution of RV strain. No significant TR to estimate RVSP - Echo 4/19: with EF 65% no evidence of RV strain  - Echo 4/21: EF 70-75% RV normal  - Echo 11/21: EF 75% RV normal no evidence PAH - Echo 8/22: EF 60-65%, RV normal inadequate TR signal to assess PA pressure - Echo this admit: EF 70-75%, RV okay, inadequate TR signal to assess PA pressure - Likely undiagnosed CTD though auto-immune panel previously negative. Does have psoriasis. Has seen Rheum. - NYHA class IIIb on admission, ReDS clip attempted, pt unable to tolerate - Volume overloaded after she had decreased home diuretics from BID to daily. Diuresed well with IV lasix 40 BID. Is/Os not accurate d/t incontinence, -3lbs. Mild AKI.  - Start PO lasix 77m BID - Holding spiro with AKI - No BP room for ARB/ARNi    2. PAH - Suspect combination of WHO Group I and III PAH - Continue Letairis and Tadalafil.  - Echo this admit as above   3. Acute on chronic Respiratory failure - Multifactorial. Continue supplemental O2. - Fluctuating O2 needs, 3-6L this admit. Desaturations with activity. - Follows with Dr. WMelvyn Novas    4. COPD-Gold III - On 3L O2 at home, recently had to increase to 6L at home - On 4L this am - DuoNebs,  steroid per primary. - Follows Pulmonary OP.   5.  PAF - Eliquis stopped due to severe LGIB 9/21 (hgb 3.1). Would not restart with AVMs. - Amio stopped previously - Remains in NSR   6. Cirrhosis - Due to RHF. Hepatitis serologies negative.  - LFTs okay on 09/25   7. Hx GIB/anemia - hgb averaging 8s-9s, 9.1 today - monitor   8. AKI - Baseline Scr ~1.2-1.4,  -  Scr up slightly to 1.68 today - avoid hypotension  9. Left upper lobe density - 1.7 X 0.8 cm density noted medial left upper lobe on CTA chest - Felt to represent ATX vs PNA vs neoplasm - Will need repeat imaging in 3 months   10. Weakness - PT/OT consulted - May need [facility] PT or short-term rehab   Length of Stay: [place], AGACNP-BC  06/05/2022, 9:10 AM  Advanced Heart Failure Team Pager [phone] (M-F; 7a - 5p)  Please contact [facility] for night-coverage after hours (5p -7a ) and weekends on [email or web address]    Patient seen and examined with the above-signed Advanced Practice Provider and/or Housestaff. I personally reviewed laboratory data, imaging studies and relevant notes. I independently examined the patient and formulated the important aspects of the plan. I have edited the note to reflect any of my changes or salient points. I have personally discussed the plan with the patient and/or family.  Continues to diurese. Breathing better but still SOB. + severe cough. No fevers or chills  General:  Weak appearing. No resp difficulty HEENT: normal Neck: supple. JVP 8-9 Carotids 2+ bilat; no bruits. No lymphadenopathy or thryomegaly appreciated. Cor: PMI nondisplaced. Regular rate & rhythm. No rubs, gallops or murmurs. Lungs: + coarse/rhonchi Abdomen: soft, nontender, nondistended. No hepatosplenomegaly. No bruits or masses. Good bowel sounds. Extremities: no cyanosis, clubbing, rash, edema Neuro: alert & orientedx3, cranial nerves grossly intact. moves all 4 extremities w/o difficulty. Affect  pleasant  Improving but still not back to baseline. Continue IV diuresis. Continue PAH meds. Continue AECOPD treatment.   If not improving can consider RHC.   [name], MD  9:04 AM

## 2022-06-05 NOTE — Plan of Care (Signed)
  Problem: Education: Goal: Ability to demonstrate management of disease process will improve Outcome: Progressing Goal: Ability to verbalize understanding of medication therapies will improve Outcome: Progressing   Problem: Activity: Goal: Capacity to carry out activities will improve Outcome: Progressing

## 2022-06-06 ENCOUNTER — Encounter: Payer: Medicare HMO | Admitting: Family Medicine

## 2022-06-06 DIAGNOSIS — I5033 Acute on chronic diastolic (congestive) heart failure: Secondary | ICD-10-CM | POA: Diagnosis not present

## 2022-06-06 LAB — BASIC METABOLIC PANEL
Anion gap: 10 (ref 5–15)
BUN: 34 mg/dL — ABNORMAL HIGH (ref 8–23)
CO2: 38 mmol/L — ABNORMAL HIGH (ref 22–32)
Calcium: 8.3 mg/dL — ABNORMAL LOW (ref 8.9–10.3)
Chloride: 92 mmol/L — ABNORMAL LOW (ref 98–111)
Creatinine, Ser: 1.76 mg/dL — ABNORMAL HIGH (ref 0.44–1.00)
GFR, Estimated: 30 mL/min — ABNORMAL LOW (ref >60)
Glucose, Bld: 158 mg/dL — ABNORMAL HIGH (ref 70–99)
Potassium: 4 mmol/L (ref 3.5–5.1)
Sodium: 140 mmol/L (ref 135–145)

## 2022-06-06 MED ORDER — IPRATROPIUM-ALBUTEROL 0.5-2.5 (3) MG/3ML IN SOLN
3.0000 mL | Freq: Two times a day (BID) | RESPIRATORY_TRACT | Status: DC
  Administered 2022-06-06 – 2022-06-07 (×2): 3 mL via RESPIRATORY_TRACT
  Filled 2022-06-06 (×3): qty 3

## 2022-06-06 MED ORDER — ALPRAZOLAM 0.5 MG PO TABS
0.5000 mg | ORAL_TABLET | Freq: Once | ORAL | Status: AC
  Administered 2022-06-06: 0.5 mg via ORAL
  Filled 2022-06-06: qty 1

## 2022-06-06 NOTE — Progress Notes (Signed)
Physical Therapy Treatment Patient Details Name: Linda Stevens MRN: [id number] DOB: 01/15/1946 Today's Date: 06/06/2022   History of Present Illness Pt is a 76 y/o female admitted for acute hypoxic respiratory failure and acute on chronic CHF work up. PMH: COPD, a fib, pulmonary HTN    PT Comments    Pt making slow progress with mobility. Pt is very, very anxious which is slowing progress.Did well with rollator and I believe she can benefit from one at home to encourage incr mobility in and out of the home.    Recommendations for follow up therapy are one component of a multi-disciplinary discharge planning process, led by the attending physician.  Recommendations may be updated based on patient status, additional functional criteria and insurance authorization.  Follow Up Recommendations  Home health PT     Assistance Recommended at Discharge Intermittent Supervision/Assistance  Patient can return home with the following A little help with walking and/or transfers;A little help with bathing/dressing/bathroom;Assistance with cooking/housework;Assist for transportation;Help with stairs or ramp for entrance   Equipment Recommendations  Rollator (4 wheels)    Recommendations for Other Services       Precautions / Restrictions Precautions Precautions: Fall;Other (comment) Precaution Comments: monitor O2/DOE, BP soft at baseline Restrictions Weight Bearing Restrictions: No     Mobility  Bed Mobility               General bed mobility comments: Pt up on BSC    Transfers Overall transfer level: Needs assistance Equipment used: None, Rollator (4 wheels) Transfers: Sit to/from Stand Sit to Stand: Min guard           General transfer comment: Assist for safety/lines    Ambulation/Gait Ambulation/Gait assistance: Min guard Gait Distance (Feet): 12 Feet Assistive device: Rollator (4 wheels) Gait Pattern/deviations: Step-through pattern, Decreased stride  length, Trunk flexed, Decreased step length - right, Decreased step length - left Gait velocity: decr Gait velocity interpretation: <1.31 ft/sec, indicative of household ambulator   General Gait Details: Assist for safety and lines. Pt very anxious and needs extra time to decr anxiety   Stairs             Wheelchair Mobility    Modified Rankin (Stroke Patients Only)       Balance Overall balance assessment: Needs assistance Sitting-balance support: No upper extremity supported, Feet supported Sitting balance-Leahy Scale: Fair     Standing balance support: No upper extremity supported, During functional activity Standing balance-Leahy Scale: Fair                              Cognition Arousal/Alertness: Awake/alert Behavior During Therapy: Anxious Overall Cognitive Status: Within Functional Limits for tasks assessed                                          Exercises      General Comments General comments (skin integrity, edema, etc.): Pt on 3L O2. With activity SpO2 82% so incr O2 to 4L. With seated rest and pursed lip breathing x 4-5 minutes returned to >90%      Pertinent Vitals/Pain      Home Living                          Prior Function  PT Goals (current goals can now be found in the care plan section) Acute Rehab PT Goals Patient Stated Goal: to improve pulmonary function and activity tolerance Progress towards PT goals: Progressing toward goals    Frequency    Min 3X/week      PT Plan Current plan remains appropriate;Equipment recommendations need to be updated    Co-evaluation              AM-PAC PT "6 Clicks" Mobility   Outcome Measure  Help needed turning from your back to your side while in a flat bed without using bedrails?: A Little Help needed moving from lying on your back to sitting on the side of a flat bed without using bedrails?: A Little Help needed moving to and  from a bed to a chair (including a wheelchair)?: A Little Help needed standing up from a chair using your arms (e.g., wheelchair or bedside chair)?: A Little Help needed to walk in hospital room?: Total Help needed climbing 3-5 steps with a railing? : Total 6 Click Score: 14    End of Session Equipment Utilized During Treatment: Oxygen Activity Tolerance: Patient limited by fatigue Patient left: with call bell/phone within reach;in chair;with chair alarm set;with family/visitor present Nurse Communication: Mobility status;Other (comment) (left O2 on 4L) PT Visit Diagnosis: Other abnormalities of gait and mobility (R26.89)     Time: 8832-5498 PT Time Calculation (min) (ACUTE ONLY): 35 min  Charges:  $Gait Training: 8-22 mins $Therapeutic Activity: 8-22 mins                     [place] [facility] [place] 06/06/2022, 1:09 PM

## 2022-06-06 NOTE — Progress Notes (Addendum)
PROGRESS NOTE    Linda Stevens  XBD:[id number] DOB: October 18, 1945 DOA: 06/02/2022 PCP: [name], MD   76/F with history of COPD, pulmonary hypertension, diastolic CHF, chronic respiratory failure on 4 to 5 L home O2, paroxysmal A-fib off anticoagulation for recurrent GI bleed scented to the ED with progressive dyspnea on exertion fatigue and cough X 2 weeks. -In the emergency room she was tachypneic, SBP was 96, O2 sats 100% on 6 L chest x-ray noted pulmonary vascular congestion, interstitial edema and small pleural effusions, hemoglobin was 9, BNP was 1663, D-dimer was elevated, CT chest noted coronary artery disease. Cardiomegaly. COPD. Patchy infiltrates in both lungs, suggesting atelectasis/pneumonia. Small to moderate bilateral pleural effusions, There is 1.7 x 0.8 cm pleural-based density in the medial left upper lobe which may suggest atelectasis/pneumonia or neoplastic process. -Echo noted EF 70-75%, moderate LVH, grade 2 DD  Subjective: -Breathing a little better this morning, down to 3 L O2  Assessment and Plan:  Acute hypoxic respiratory failure Acute on chronic diastolic CHF ? Pulm HTN -Echo this admission EF 70-75%, moderate LVH, grade 2 diastolic dysfunction, PA pressures could not be assessed -Advanced heart failure team following -Diuresed with IV Lasix X 2 days, I/O inaccurate, 1.5 L negative, weight down 3lbs -Marginal BP, mild AKI limiting GDMT, will consider SGLT2i if blood pressure improves -Continue tadalafil and Ambrisentan -Ambulate, PT OT eval-> Home health PT recommended -Anxiety likely contributing to her symptoms as well  COPD, chronic respiratory failure -On 4 to 5 L home O2 at baseline -Continue DuoNebs, prednisone-slow taper, cefdinir x5days -cannot r/o Pneumonia vs Atx on CT chest  P.Atrial fibrillation -Eliquis discontinued in 2021 for severe GI bleed from AVMs -In sinus rhythm, off amio  Cardiac cirrhosis -Likely secondary to right heart  failure, hep serologies previously negative -Albumin is 3.2  History of chronic GI bleeds/AVM History of chronic anemia -Hemoglobin is stable, monitor  Left upper lobe density -Incidentally noted on imaging, will need repeat CT chest in 3 to 6 months   DVT prophylaxis: Heparin subcutaneous Code Status: DNR Family Communication: This patient detail, no fam at bedside Disposition Plan: Home likely 1 to 2 days  Consultants: Advanced heart failure team   Procedures:   Antimicrobials:    Objective: Vitals:   06/06/22 0331 06/06/22 0716 06/06/22 0735 06/06/22 0814  BP: 102/65 (!) 113/53    Pulse: 75 73    Resp: (!) 23 15    Temp: (!) 97.5 F (36.4 C) 98 F (36.7 C) 97.7 F (36.5 C)   TempSrc: Oral Oral Oral   SpO2: 99% 100%  100%  Weight: 60.2 kg     Height:        Intake/Output Summary (Last 24 hours) at 06/06/2022 1025 Last data filed at 06/06/2022 8341 Gross per 24 hour  Intake 200 ml  Output 700 ml  Net -500 ml   Filed Weights   06/04/22 0111 06/05/22 0403 06/06/22 0331  Weight: 61.6 kg 60.1 kg 60.2 kg    Examination:  General exam: Chronically ill female appears older than stated age, AAOx3, no distress HEENT: No JVD CVS: S1-S2, regular rhythm Lungs: Poor air movement, rare scattered rhonchi Abdomen: Soft, nontender, bowel sounds present Extremities: Trace edema Skin: No rashes Psychiatry:  Mood & affect appropriate.     Data Reviewed:   CBC: Recent Labs  Lab 06/02/22 1411 06/02/22 1658 06/03/22 0355 06/04/22 0055 06/05/22 0029  WBC 3.9*  --  3.9* 4.5 5.4  NEUTROABS 2.6  --   --  3.3  --   HGB 9.0* 10.2* 9.1* 8.7* 9.1*  HCT 27.2* 30.0* 29.5* 28.3* 29.9*  MCV 99.8  --  105.4* 104.0* 105.3*  PLT 117.0*  --  120* 133* 269*   Basic Metabolic Panel: Recent Labs  Lab 06/02/22 1411 06/02/22 1658 06/02/22 1730 06/03/22 0355 06/04/22 0055 06/05/22 0029 06/06/22 0022  NA 145 142  --  143 142 144 140  K 4.3 4.2  --  4.1 4.0 3.8 4.0  CL  97  --   --  94* 94* 94* 92*  CO2 41*  --   --  36* 38* 41* 38*  GLUCOSE 97  --   --  125* 100* 96 158*  BUN 24*  --   --  20 31* 36* 34*  CREATININE 1.46*  --   --  1.51* 1.71* 1.68* 1.76*  CALCIUM 9.1  --   --  8.8* 8.4* 8.5* 8.3*  MG  --   --  2.1 2.1 2.3  --   --   PHOS  --   --   --   --  3.6  --   --    GFR: Estimated Creatinine Clearance: 24 mL/min (A) (by C-G formula based on SCr of 1.76 mg/dL (H)). Liver Function Tests: Recent Labs  Lab 06/02/22 1411 06/03/22 0939  AST 18 37  ALT 10 14  ALKPHOS 85 71  BILITOT 0.6 1.1  PROT 6.6 6.4*  ALBUMIN 3.6 3.2*   No results for input(s): "LIPASE", "AMYLASE" in the last 168 hours. No results for input(s): "AMMONIA" in the last 168 hours. Coagulation Profile: Recent Labs  Lab 06/03/22 0939  INR 1.2   Cardiac Enzymes: No results for input(s): "CKTOTAL", "CKMB", "CKMBINDEX", "TROPONINI" in the last 168 hours. BNP (last 3 results) No results for input(s): "PROBNP" in the last 8760 hours. HbA1C: No results for input(s): "HGBA1C" in the last 72 hours. CBG: No results for input(s): "GLUCAP" in the last 168 hours. Lipid Profile: No results for input(s): "CHOL", "HDL", "LDLCALC", "TRIG", "CHOLHDL", "LDLDIRECT" in the last 72 hours.  Thyroid Function Tests: No results for input(s): "TSH", "T4TOTAL", "FREET4", "T3FREE", "THYROIDAB" in the last 72 hours.  Anemia Panel: No results for input(s): "VITAMINB12", "FOLATE", "FERRITIN", "TIBC", "IRON", "RETICCTPCT" in the last 72 hours.  Urine analysis:    Component Value Date/Time   COLORURINE YELLOW 05/05/2021 2154   APPEARANCEUR HAZY (A) 05/05/2021 2154   LABSPEC 1.021 05/05/2021 2154   PHURINE 5.0 05/05/2021 2154   GLUCOSEU NEGATIVE 05/05/2021 2154   GLUCOSEU NEGATIVE 07/07/2017 1625   HGBUR NEGATIVE 05/05/2021 2154   HGBUR negative 12/22/2007 [place] 05/05/2021 2154   [place] 05/05/2021 2154   PROTEINUR NEGATIVE 05/05/2021 2154   UROBILINOGEN  0.2 07/07/2017 1625   NITRITE NEGATIVE 05/05/2021 2154   LEUKOCYTESUR TRACE (A) 05/05/2021 2154   Sepsis Labs: _0 (procalcitonin:4,lacticidven:4)  )No results found for this or any previous visit (from the past 240 hour(s)).   Radiology Studies: ECHOCARDIOGRAM COMPLETE  Result Date: 06/03/2022    ECHOCARDIOGRAM REPORT   Patient Name:   Linda Stevens Date of Exam: 06/03/2022 Medical Rec #:  [id number]           Height:       62.0 in Accession #:    [id number]          Weight:       134.9 lb Date of Birth:  June 25, 1946           BSA:  1.617 m Patient Age:    79 years            BP:           119/51 mmHg Patient Gender: F                   HR:           84 bpm. Exam Location:  Inpatient Procedure: 2D Echo, Cardiac Doppler and Color Doppler Indications:    CHF-acute diastolic  History:        Patient has prior history of Echocardiogram examinations, most                 recent 04/14/2021. CHF, COPD and Pulmonary HTN, Arrythmias:Atrial                 Fibrillation, Signs/Symptoms:Shortness of Breath; Risk                 Factors:Former Smoker and Hypertension.  Sonographer:    Clayton Lefort RDCS (AE) Referring Phys: [id number] [place]  1. Left ventricular ejection fraction, by estimation, is 70 to 75%. The left ventricle has hyperdynamic function. Left ventricular endocardial border not optimally defined to evaluate regional wall motion. There is moderate concentric left ventricular hypertrophy. Left ventricular diastolic parameters are consistent with Grade II diastolic dysfunction (pseudonormalization). Elevated left atrial pressure.  2. Right ventricular systolic function is normal. The right ventricular size is normal. Tricuspid regurgitation signal is inadequate for assessing PA pressure.  3. Left atrial size was mildly dilated.  4. A small pericardial effusion is present.  5. Mitral Valve Area (MVA) = 1.74 cm2, but elevated gradients may be related to hyperdynamic ventricle.  The mitral valve is grossly normal. No evidence of mitral valve regurgitation. Mild mitral stenosis. The mean mitral valve gradient is 5.0 mmHg with average heart rate of 82 bpm.  6. The aortic valve was not well visualized. Aortic valve regurgitation is not visualized. Aortic valve sclerosis is present, with no evidence of aortic valve stenosis.  7. The inferior vena cava is dilated in size with <50% respiratory variability, suggesting right atrial pressure of 15 mmHg. Comparison(s): No significant change from prior study. FINDINGS  Left Ventricle: Left ventricular ejection fraction, by estimation, is 70 to 75%. The left ventricle has hyperdynamic function. Left ventricular endocardial border not optimally defined to evaluate regional wall motion. The left ventricular internal cavity size was normal in size. There is moderate concentric left ventricular hypertrophy. Left ventricular diastolic parameters are consistent with Grade II diastolic dysfunction (pseudonormalization). Elevated left atrial pressure. Right Ventricle: The right ventricular size is normal. No increase in right ventricular wall thickness. Right ventricular systolic function is normal. Tricuspid regurgitation signal is inadequate for assessing PA pressure. Left Atrium: Left atrial size was mildly dilated. Right Atrium: Right atrial size was normal in size. Pericardium: A small pericardial effusion is present. Mitral Valve: Mitral Valve Area (MVA) = 1.74 cm2, but elevated gradients may be related to hyperdynamic ventricle. The mitral valve is grossly normal. Mild mitral annular calcification. No evidence of mitral valve regurgitation. Mild mitral valve stenosis. MV peak gradient, 10.9 mmHg. The mean mitral valve gradient is 5.0 mmHg with average heart rate of 82 bpm. Tricuspid Valve: The tricuspid valve is normal in structure. Tricuspid valve regurgitation is not demonstrated. No evidence of tricuspid stenosis. Aortic Valve: The aortic valve was  not well visualized. Aortic valve regurgitation is not visualized. Aortic valve sclerosis is present, with no evidence  of aortic valve stenosis. Aortic valve mean gradient measures 9.0 mmHg. Aortic valve peak gradient measures 15.5 mmHg. Aortic valve area, by VTI measures 1.82 cm. Pulmonic Valve: The pulmonic valve was not well visualized. Pulmonic valve regurgitation is not visualized. Aorta: The aortic root and ascending aorta are structurally normal, with no evidence of dilitation. Venous: The inferior vena cava is dilated in size with less than 50% respiratory variability, suggesting right atrial pressure of 15 mmHg. IAS/Shunts: No atrial level shunt detected by color flow Doppler.  LEFT VENTRICLE PLAX 2D LVIDd:         4.10 cm   Diastology LVIDs:         2.60 cm   LV e' medial:    6.64 cm/s LV PW:         1.60 cm   LV E/e' medial:  20.2 LV IVS:        1.30 cm   LV e' lateral:   6.53 cm/s LVOT diam:     1.80 cm   LV E/e' lateral: 20.5 LV SV:         76 LV SV Index:   47 LVOT Area:     2.54 cm  RIGHT VENTRICLE             IVC RV Basal diam:  3.00 cm     IVC diam: 2.20 cm RV S prime:     10.40 cm/s TAPSE (M-mode): 1.4 cm LEFT ATRIUM             Index        RIGHT ATRIUM           Index LA diam:        3.80 cm 2.35 cm/m   RA Area:     16.10 cm LA Vol (A2C):   40.8 ml 25.23 ml/m  RA Volume:   31.30 ml  19.35 ml/m LA Vol (A4C):   65.7 ml 40.63 ml/m LA Biplane Vol: 52.2 ml 32.28 ml/m  AORTIC VALVE AV Area (Vmax):    1.82 cm AV Area (Vmean):   1.77 cm AV Area (VTI):     1.82 cm AV Vmax:           197.00 cm/s AV Vmean:          136.000 cm/s AV VTI:            0.419 m AV Peak Grad:      15.5 mmHg AV Mean Grad:      9.0 mmHg LVOT Vmax:         141.00 cm/s LVOT Vmean:        94.700 cm/s LVOT VTI:          0.299 m LVOT/AV VTI ratio: 0.71  AORTA Ao Root diam: 2.80 cm Ao Asc diam:  3.10 cm MITRAL VALVE MV Area (PHT): 2.77 cm     SHUNTS MV Area VTI:   1.73 cm     Systemic VTI:  0.30 m MV Peak grad:  10.9 mmHg     Systemic Diam: 1.80 cm MV Mean grad:  5.0 mmHg MV Vmax:       1.65 m/s MV Vmean:      102.0 cm/s MV Decel Time: 274 msec MV E velocity: 134.00 cm/s MV A velocity: 117.00 cm/s MV E/A ratio:  1.15 Rudean Haskell MD Electronically signed by Rudean Haskell MD Signature Date/Time: 06/03/2022/3:32:29 PM    Final    VAS [place] LOWER EXTREMITY VENOUS (DVT)  Result Date:  06/03/2022  Lower Venous DVT Study Patient Name:  Linda Stevens  Date of Exam:   06/03/2022 Medical Rec #: [id number]            Accession #:    [id number] Date of Birth: [birth year]            Patient Gender: F Patient Age:   34 years Exam Location:  [facility] Procedure:      VAS [place] LOWER EXTREMITY VENOUS (DVT) Referring Phys: A POWELL JR --------------------------------------------------------------------------------  Indications: Elevated Ddimer.  Risk Factors: None identified. Limitations: Poor ultrasound/tissue interface and patient pain tolerance. Comparison Study: No prior studies. Performing Technologist: Oliver Hum RVT  Examination Guidelines: A complete evaluation includes B-mode imaging, spectral Doppler, color Doppler, and power Doppler as needed of all accessible portions of each vessel. Bilateral testing is considered an integral part of a complete examination. Limited examinations for reoccurring indications may be performed as noted. The reflux portion of the exam is performed with the patient in reverse Trendelenburg.  +---------+---------------+---------+-----------+----------+--------------+ RIGHT    CompressibilityPhasicitySpontaneityPropertiesThrombus Aging +---------+---------------+---------+-----------+----------+--------------+ CFV      Full           Yes      Yes                                 +---------+---------------+---------+-----------+----------+--------------+ SFJ      Full                                                         +---------+---------------+---------+-----------+----------+--------------+ FV Prox  Full                                                        +---------+---------------+---------+-----------+----------+--------------+ FV Mid   Full                                                        +---------+---------------+---------+-----------+----------+--------------+ FV DistalFull                                                        +---------+---------------+---------+-----------+----------+--------------+ PFV      Full                                                        +---------+---------------+---------+-----------+----------+--------------+ POP      Full           Yes      Yes                                 +---------+---------------+---------+-----------+----------+--------------+  PTV      Full                                                        +---------+---------------+---------+-----------+----------+--------------+ PERO     Full                                                        +---------+---------------+---------+-----------+----------+--------------+   +---------+---------------+---------+-----------+----------+--------------+ LEFT     CompressibilityPhasicitySpontaneityPropertiesThrombus Aging +---------+---------------+---------+-----------+----------+--------------+ CFV      Full           Yes      Yes                                 +---------+---------------+---------+-----------+----------+--------------+ SFJ      Full                                                        +---------+---------------+---------+-----------+----------+--------------+ FV Prox  Full                                                        +---------+---------------+---------+-----------+----------+--------------+ FV Mid   Full                                                         +---------+---------------+---------+-----------+----------+--------------+ FV DistalFull                                                        +---------+---------------+---------+-----------+----------+--------------+ PFV      Full                                                        +---------+---------------+---------+-----------+----------+--------------+ POP      Full           Yes      Yes                                 +---------+---------------+---------+-----------+----------+--------------+ PTV      Full                                                        +---------+---------------+---------+-----------+----------+--------------+  PERO     Full                                                        +---------+---------------+---------+-----------+----------+--------------+     Summary: RIGHT: - There is no evidence of deep vein thrombosis in the lower extremity. However, portions of this examination were limited- see technologist comments above.  - No cystic structure found in the popliteal fossa.  LEFT: - There is no evidence of deep vein thrombosis in the lower extremity. However, portions of this examination were limited- see technologist comments above.  - No cystic structure found in the popliteal fossa.  *See table(s) above for measurements and observations. Electronically signed by Deitra Mayo MD on 06/03/2022 at 1:08:09 PM.    Final    CT Angio Chest Pulmonary Embolism (PE) W or WO Contrast  Result Date: 06/03/2022 CLINICAL DATA:  Positive D-dimer, dyspnea on exertion EXAM: CT ANGIOGRAPHY CHEST WITH CONTRAST TECHNIQUE: Multidetector CT imaging of the chest was performed using the standard protocol during bolus administration of intravenous contrast. Multiplanar CT image reconstructions and MIPs were obtained to evaluate the vascular anatomy. RADIATION DOSE REDUCTION: This exam was performed according to the departmental dose-optimization program  which includes automated exposure control, adjustment of the mA and/or kV according to patient size and/or use of iterative reconstruction technique. CONTRAST:  86m OMNIPAQUE IOHEXOL 350 MG/ML SOLN COMPARISON:  CT done on 09/10/2021, chest radiograph done on 06/02/2022 FINDINGS: Cardiovascular: There is homogeneous enhancement in thoracic aorta. There is focal ectasia of lower course of descending thoracic aorta measuring 3.2 cm. Coronary artery calcifications are seen. There is ectasia of main pulmonary artery measuring 4 cm. There are no intraluminal filling defects in pulmonary artery branches. Evaluation of small peripheral branches in the lower lung fields is limited by infiltrates. Heart is enlarged in size. Mediastinum/Nodes: There are slightly enlarged lymph nodes in mediastinum and hilar regions with no significant change. Lungs/Pleura: Centrilobular emphysema is seen. There are patchy linear infiltrates in right middle lobe, lingula and both lower lobes, more so in right lower lobe. In image 27 of series 6, there is 1.7 x 0.8 cm nodular density in the medial left upper lung field. Small to moderate bilateral pleural effusions are seen, more so on the right side. There is mild thickening of pleura in the periphery of right lower lung field. There is no pneumothorax. Upper Abdomen: There is mild nodularity in liver surface suggesting possible cirrhosis. Musculoskeletal: There is mild decrease in height of upper endplates of bodies of TC78and T12 vertebrae with no significant interval change. Review of the MIP images confirms the above findings. IMPRESSION: There is no evidence of pulmonary artery embolism. There is ectasia of main pulmonary artery suggesting pulmonary arterial hypertension. There is no evidence of thoracic aortic dissection. There is aneurysmal dilation in the descending thoracic aorta measuring 3.2 cm. Coronary artery disease. Cardiomegaly. COPD. Patchy infiltrates are seen in both lungs,  more so in right lower lobe suggesting atelectasis/pneumonia. Small to moderate bilateral pleural effusions are seen, more so on the right side with interval increase. There is 1.7 x 0.8 cm pleural-based density in the medial left upper lobe which may suggest atelectasis/pneumonia or neoplastic process. Short-term follow-up CT chest in 3 months should be considered to re-evaluate this finding. If the density is persistent,  PET-CT should be considered. Other findings as described in the body of the report. Electronically Signed   By: Elmer Picker M.D.   On: 06/03/2022 12:50   DG Chest Port 1 View  Result Date: 06/02/2022 CLINICAL DATA:  Shortness of breath EXAM: PORTABLE CHEST 1 VIEW COMPARISON:  08/19/2021, CT 09/10/2021 FINDINGS: Cardiomegaly with small bilateral pleural effusions. Vascular congestion and mild diffuse interstitial edema. There is emphysema. Basilar airspace disease. IMPRESSION: 1. Cardiomegaly with vascular congestion, mild interstitial edema and small bilateral effusions 2. Emphysema. 3. Basilar airspace disease, atelectasis versus pneumonia Electronically Signed   By: Donavan Foil M.D.   On: 06/02/2022 16:04     Scheduled Meds:  ALPRAZolam  0.5 mg Oral Once   ambrisentan  10 mg Oral Daily   aspirin EC  81 mg Oral Daily   cefdinir  300 mg Oral Daily   furosemide  40 mg Oral BID   heparin  5,000 Units Subcutaneous Q8H   hydrOXYzine  50 mg Oral QHS   ipratropium-albuterol  3 mL Nebulization TID   levothyroxine  75 mcg Oral Daily   pantoprazole  40 mg Oral Daily   predniSONE  40 mg Oral Q breakfast   sodium chloride flush  3 mL Intravenous Q12H   tadalafil  40 mg Oral Daily   Continuous Infusions:   LOS: 4 days    Time spent: 65mn  PDomenic Polite MD [facility]   06/06/2022, 10:25 AM

## 2022-06-06 NOTE — Progress Notes (Addendum)
Advanced Heart Failure Rounding Note  PCP-Cardiologist: [name], MD   Subjective:    Last dose of IV lasix was yesterday. Is/Os inaccurate, frequently wets bed.   Scr 1.46>1.51>1.71>1.68>1.76 CO2 38 (chronically elevated) K 4  Feels better and breathing better this morning. On 5L [place]. Denies CP.   Echo: EF 70-75%, moderate LVH, grade II DD, RV okay, mild MS with mean gradient of 5 mmHg, dilated IVC  Chest CT: No PE, evidence COPD, patchy b/l infiltrates RLL> L suggesting ATX vs pneumonia, small to moderate b/l pleural effusions ( R>L), 1.7 X 0.8 cm pleural density LUL (repeat CT chest recommended in 3 months)  Objective:   Weight Range: 60.2 kg Body mass index is 24.27 kg/m.   Vital Signs:   Temp:  [97.5 F (36.4 C)-98 F (36.7 C)] 97.7 F (36.5 C) (09/29 0735) Pulse Rate:  [70-78] 73 (09/29 0716) Resp:  [15-23] 15 (09/29 0716) BP: (97-114)/(43-65) 113/53 (09/29 0716) SpO2:  [94 %-100 %] 100 % (09/29 0716) Weight:  [60.2 kg] 60.2 kg (09/29 0331) Last BM Date : 06/02/22  Weight change: Filed Weights   06/04/22 0111 06/05/22 0403 06/06/22 0331  Weight: 61.6 kg 60.1 kg 60.2 kg    Intake/Output:   Intake/Output Summary (Last 24 hours) at 06/06/2022 0810 Last data filed at 06/06/2022 0333 Gross per 24 hour  Intake 400 ml  Output 700 ml  Net -300 ml    Physical Exam  General:  elderly, chronically ill appearing.  No respiratory difficulty HEENT: + telangiectasias  Neck: supple. No JVD. Carotids 2+ bilat; no bruits. No lymphadenopathy or thyromegaly appreciated. Cor: PMI nondisplaced. Regular rate & rhythm. No rubs, gallops or murmurs. Lungs: diminished Abdomen: soft, nontender, nondistended. No hepatosplenomegaly. No bruits or masses. Good bowel sounds. Extremities: no cyanosis, clubbing, rash, edema  Neuro: alert & oriented x 3, cranial nerves grossly intact. moves all 4 extremities w/o difficulty. Affect pleasant.   Telemetry   NSR 60s  (Personally reviewed)    Labs    CBC Recent Labs    06/04/22 0055 06/05/22 0029  WBC 4.5 5.4  NEUTROABS 3.3  --   HGB 8.7* 9.1*  HCT 28.3* 29.9*  MCV 104.0* 105.3*  PLT 133* 622*   Basic Metabolic Panel Recent Labs    06/04/22 0055 06/05/22 0029 06/06/22 0022  NA 142 144 140  K 4.0 3.8 4.0  CL 94* 94* 92*  CO2 38* 41* 38*  GLUCOSE 100* 96 158*  BUN 31* 36* 34*  CREATININE 1.71* 1.68* 1.76*  CALCIUM 8.4* 8.5* 8.3*  MG 2.3  --   --   PHOS 3.6  --   --    Liver Function Tests Recent Labs    06/03/22 0939  AST 37  ALT 14  ALKPHOS 71  BILITOT 1.1  PROT 6.4*  ALBUMIN 3.2*   No results for input(s): "LIPASE", "AMYLASE" in the last 72 hours. Cardiac Enzymes No results for input(s): "CKTOTAL", "CKMB", "CKMBINDEX", "TROPONINI" in the last 72 hours.  BNP: BNP (last 3 results) Recent Labs    06/02/22 1731  BNP 1,663.8*    ProBNP (last 3 results) No results for input(s): "PROBNP" in the last 8760 hours.   D-Dimer No results for input(s): "DDIMER" in the last 72 hours.  Hemoglobin A1C No results for input(s): "HGBA1C" in the last 72 hours. Fasting Lipid Panel No results for input(s): "CHOL", "HDL", "LDLCALC", "TRIG", "CHOLHDL", "LDLDIRECT" in the last 72 hours.  Thyroid Function Tests No results for  input(s): "TSH", "T4TOTAL", "T3FREE", "THYROIDAB" in the last 72 hours.  Invalid input(s): "FREET3"   Other results:   Imaging    No results found.   Medications:     Scheduled Medications:  ambrisentan  10 mg Oral Daily   aspirin EC  81 mg Oral Daily   cefdinir  300 mg Oral Daily   furosemide  40 mg Oral BID   heparin  5,000 Units Subcutaneous Q8H   hydrOXYzine  50 mg Oral QHS   ipratropium-albuterol  3 mL Nebulization TID   levothyroxine  75 mcg Oral Daily   pantoprazole  40 mg Oral Daily   predniSONE  40 mg Oral Q breakfast   sodium chloride flush  3 mL Intravenous Q12H   tadalafil  40 mg Oral Daily    Infusions:   PRN  Medications: acetaminophen **OR** acetaminophen, ondansetron **OR** ondansetron (ZOFRAN) IV, oxymetazoline, senna-docusate    Patient Profile   76 y.o.female with PMH of HTN, COPD (former smoker quit 2015), seasonal allergies, asthma, recurrent pleural effusions, pulmonary HTN, PAF, off anticoagulation 2/2 GIB and diastolic dysfxn. Admitted with  a/c respiratory failure with hypoxia and hypercarbia 2/2 a/c diastolic HF w/ RV failure and PAH.     Assessment/Plan   1. Acute on chronic diastolic HF with RV failure: - Repeat RHC 12/18 with mild PAH (mean PA 33) - Echo 5/18: with complete resolution of RV strain. No significant TR to estimate RVSP - Echo 4/19: with EF 65% no evidence of RV strain  - Echo 4/21: EF 70-75% RV normal  - Echo 11/21: EF 75% RV normal no evidence PAH - Echo 8/22: EF 60-65%, RV normal inadequate TR signal to assess PA pressure - Echo this admit: EF 70-75%, RV okay, inadequate TR signal to assess PA pressure - Likely undiagnosed CTD though auto-immune panel previously negative. Does have psoriasis. Has seen Rheum. - NYHA class IIIb on admission, ReDS clip attempted, pt unable to tolerate - Volume overloaded after she had decreased home diuretics from BID to daily. Diuresed well with IV lasix 40 BID, last dose yesterday. Is/Os not accurate d/t incontinence. Mild AKI.  - Start PO lasix 25m BID today - Holding spiro with AKI - No BP room for ARB/ARNi  - Refused repeat RHC today   2. PAH - Suspect combination of WHO Group I and III PAH - Continue Letairis and Tadalafil.  - Echo this admit as above   3. Acute on chronic Respiratory failure - Multifactorial. Continue supplemental O2. - Fluctuating O2 needs, 3-6L this admit. Desaturations with activity. - Follows with Dr. WMelvyn Novas    4. COPD-Gold III - On 3L O2 at home, recently had to increase to 6L at home - On 4L this am - DuoNebs, steroid per primary. - Follows Pulmonary OP.   5.  PAF - Eliquis stopped due  to severe LGIB 9/21 (hgb 3.1). Would not restart with AVMs. - Amio stopped previously - Remains in NSR   6. Cirrhosis - Due to RHF. Hepatitis serologies negative.  - LFTs okay on 09/25   7. Hx GIB/anemia - hgb averaging 8s-9s, 9.1 yesterday - monitor   8. AKI - Baseline Scr ~1.2-1.4,  - Scr up slightly to 1.76 today - avoid hypotension  9. Left upper lobe density - 1.7 X 0.8 cm density noted medial left upper lobe on CTA chest - Felt to represent ATX vs PNA vs neoplasm - Will need repeat imaging in 3 months   10. Weakness - PT/OT consulted -  May need HH PT or short-term rehab  Length of Stay: 4  Heart failure team will now sign off. Diuretics to start tomorrow, Lasix 40 mg BID. F/u scheduled. Please contact HF team if needed again.   Earnie Larsson, AGACNP-BC  06/06/2022, 8:10 AM  Advanced Heart Failure Team Pager [phone] (M-F; 7a - 5p)  Please contact [facility] for night-coverage after hours (5p -7a ) and weekends on [email or web address]   Patient seen and examined with the above-signed Advanced Practice Provider and/or Housestaff. I personally reviewed laboratory data, imaging studies and relevant notes. I independently examined the patient and formulated the important aspects of the plan. I have edited the note to reflect any of my changes or salient points. I have personally discussed the plan with the patient and/or family.  Breathing better. Getting close to baseline. Still anxious about dispo. PT recommending HHPT  General:  Sitting in bed No resp difficulty HEENT: normal Neck: supple. JVP 7. Carotids 2+ bilat; no bruits. No lymphadenopathy or thryomegaly appreciated. Cor: PMI nondisplaced. Regular rate & rhythm. 2/6 TR Lungs: coarse Abdomen: soft, nontender, nondistended. No hepatosplenomegaly. No bruits or masses. Good bowel sounds. Extremities: no cyanosis, clubbing, rash, edema Neuro: alert & orientedx3, cranial nerves grossly intact. moves all 4 extremities w/o  difficulty. Affect pleasant  She is ready for d/c from HF perspective. Continue previous HF meds. We have arranged f/u in HF Clinic. The HF team will sign off.  [name], MD  6:41 PM

## 2022-06-06 NOTE — Plan of Care (Signed)
  Problem: Education: Goal: Ability to demonstrate management of disease process will improve Outcome: Progressing Goal: Ability to verbalize understanding of medication therapies will improve Outcome: Progressing   

## 2022-06-06 NOTE — Care Management Important Message (Signed)
Important Message  Patient Details  Name: Linda Stevens MRN: [id number] Date of Birth: [birth year]   Medicare Important Message Given:  Yes     Wen Merced [address] 06/06/2022, 3:25 PM

## 2022-06-07 DIAGNOSIS — J45909 Unspecified asthma, uncomplicated: Secondary | ICD-10-CM | POA: Diagnosis not present

## 2022-06-07 DIAGNOSIS — J9601 Acute respiratory failure with hypoxia: Secondary | ICD-10-CM | POA: Diagnosis not present

## 2022-06-07 DIAGNOSIS — I5033 Acute on chronic diastolic (congestive) heart failure: Secondary | ICD-10-CM | POA: Diagnosis not present

## 2022-06-07 DIAGNOSIS — R062 Wheezing: Secondary | ICD-10-CM | POA: Diagnosis not present

## 2022-06-07 DIAGNOSIS — J449 Chronic obstructive pulmonary disease, unspecified: Secondary | ICD-10-CM | POA: Diagnosis not present

## 2022-06-07 LAB — CBC
HCT: 26.5 % — ABNORMAL LOW (ref 36.0–46.0)
Hemoglobin: 8.4 g/dL — ABNORMAL LOW (ref 12.0–15.0)
MCH: 32.8 pg (ref 26.0–34.0)
MCHC: 31.7 g/dL (ref 30.0–36.0)
MCV: 103.5 fL — ABNORMAL HIGH (ref 80.0–100.0)
Platelets: 127 10*3/uL — ABNORMAL LOW (ref 150–400)
RBC: 2.56 MIL/uL — ABNORMAL LOW (ref 3.87–5.11)
RDW: 17.5 % — ABNORMAL HIGH (ref 11.5–15.5)
WBC: 4.5 10*3/uL (ref 4.0–10.5)
nRBC: 0 % (ref 0.0–0.2)

## 2022-06-07 LAB — BASIC METABOLIC PANEL
Anion gap: 9 (ref 5–15)
BUN: 40 mg/dL — ABNORMAL HIGH (ref 8–23)
CO2: 37 mmol/L — ABNORMAL HIGH (ref 22–32)
Calcium: 8.1 mg/dL — ABNORMAL LOW (ref 8.9–10.3)
Chloride: 95 mmol/L — ABNORMAL LOW (ref 98–111)
Creatinine, Ser: 2.2 mg/dL — ABNORMAL HIGH (ref 0.44–1.00)
GFR, Estimated: 23 mL/min — ABNORMAL LOW (ref >60)
Glucose, Bld: 100 mg/dL — ABNORMAL HIGH (ref 70–99)
Potassium: 4.1 mmol/L (ref 3.5–5.1)
Sodium: 141 mmol/L (ref 135–145)

## 2022-06-07 MED ORDER — PREDNISONE 20 MG PO TABS: 20.0000 mg | ORAL_TABLET | Freq: Every day | ORAL | 0 refills | Status: AC

## 2022-06-07 MED ORDER — CEFDINIR 300 MG PO CAPS: 300.0000 mg | ORAL_CAPSULE | Freq: Every day | ORAL | 0 refills | Status: AC

## 2022-06-07 NOTE — Plan of Care (Signed)
  Problem: Activity: ?Goal: Capacity to carry out activities will improve ?Outcome: Progressing ?  ?

## 2022-06-07 NOTE — Plan of Care (Signed)
  Problem: Education: Goal: Ability to demonstrate management of disease process will improve 06/07/2022 0805 by Emmaline Life, RN Outcome: Progressing 06/06/2022 1858 by Emmaline Life, RN Outcome: Progressing Goal: Ability to verbalize understanding of medication therapies will improve Outcome: Progressing   Problem: Activity: Goal: Capacity to carry out activities will improve Outcome: Progressing

## 2022-06-07 NOTE — Plan of Care (Signed)
  Problem: Education: Goal: Ability to demonstrate management of disease process will improve 06/07/2022 1028 by Emmaline Life, RN Outcome: Adequate for Discharge 06/07/2022 0805 by Emmaline Life, RN Outcome: Progressing Goal: Ability to verbalize understanding of medication therapies will improve Outcome: Adequate for Discharge Goal: Individualized Educational Video(s) Outcome: Adequate for Discharge   Problem: Activity: Goal: Capacity to carry out activities will improve 06/07/2022 1028 by Emmaline Life, RN Outcome: Adequate for Discharge 06/07/2022 0805 by Emmaline Life, RN Outcome: Progressing   Problem: Cardiac: Goal: Ability to achieve and maintain adequate cardiopulmonary perfusion will improve Outcome: Adequate for Discharge   Problem: Cardiac: Goal: Ability to achieve and maintain adequate cardiopulmonary perfusion will improve Outcome: Adequate for Discharge   Problem: Education: Goal: Knowledge of General Education information will improve Description: Including pain rating scale, medication(s)/side effects and non-pharmacologic comfort measures Outcome: Adequate for Discharge   Problem: Health Behavior/Discharge Planning: Goal: Ability to manage health-related needs will improve Outcome: Adequate for Discharge   Problem: Clinical Measurements: Goal: Ability to maintain clinical measurements within normal limits will improve Outcome: Adequate for Discharge Goal: Will remain free from infection Outcome: Adequate for Discharge Goal: Diagnostic test results will improve Outcome: Adequate for Discharge Goal: Respiratory complications will improve Outcome: Adequate for Discharge Goal: Cardiovascular complication will be avoided Outcome: Adequate for Discharge   Problem: Activity: Goal: Risk for activity intolerance will decrease Outcome: Adequate for Discharge   Problem: Nutrition: Goal: Adequate nutrition will be maintained Outcome: Adequate for  Discharge   Problem: Coping: Goal: Level of anxiety will decrease Outcome: Adequate for Discharge   Problem: Elimination: Goal: Will not experience complications related to bowel motility Outcome: Adequate for Discharge Goal: Will not experience complications related to urinary retention Outcome: Adequate for Discharge   Problem: Pain Managment: Goal: General experience of comfort will improve Outcome: Adequate for Discharge   Problem: Safety: Goal: Ability to remain free from injury will improve Outcome: Adequate for Discharge   Problem: Skin Integrity: Goal: Risk for impaired skin integrity will decrease Outcome: Adequate for Discharge   Problem: Acute Rehab OT Goals (only OT should resolve) Goal: Pt. Will Transfer To Toilet Outcome: Adequate for Discharge Goal: Pt. Will Perform Toileting-Clothing Manipulation Outcome: Adequate for Discharge Goal: OT Additional ADL Goal #1 Outcome: Adequate for Discharge Goal: OT Additional ADL Goal #2 Outcome: Adequate for Discharge   Problem: Acute Rehab OT Goals (only OT should resolve) Goal: Pt. Will Transfer To Toilet Outcome: Adequate for Discharge Goal: Pt. Will Perform Toileting-Clothing Manipulation Outcome: Adequate for Discharge Goal: OT Additional ADL Goal #1 Outcome: Adequate for Discharge Goal: OT Additional ADL Goal #2 Outcome: Adequate for Discharge   Problem: Acute Rehab PT Goals(only PT should resolve) Goal: Pt Will Go Supine/Side To Sit Outcome: Adequate for Discharge Goal: Pt Will Go Sit To Supine/Side Outcome: Adequate for Discharge Goal: Patient Will Transfer Sit To/From Stand Outcome: Adequate for Discharge Goal: Pt Will Transfer Bed To Chair/Chair To Bed Outcome: Adequate for Discharge Goal: Pt Will Ambulate Outcome: Adequate for Discharge Goal: Pt Will Go Up/Down Stairs Outcome: Adequate for Discharge

## 2022-06-07 NOTE — Progress Notes (Signed)
Discharge instructions, RX and follow up appts explained and provided to patient and husband verbalized understanding. Rolator sent home with patient, Home Health Set up by case mang contact information on AVS. Patient left floor via wheelchair accompanied by staff. No c/o pain or shortness of breath at d/c.   Alisia Vanengen, Tivis Ringer, RN

## 2022-06-07 NOTE — TOC Transition Note (Addendum)
Transition of Care [facility]) - CM/SW Discharge Note   Patient Details  Name: Linda Stevens MRN: [id number] Date of Birth: March 13, 1946  [facility] CM/SW Contact:  Carles Collet, RN Phone Number: 06/07/2022, 9:38 AM   Clinical Narrative:     Spoke w patient and spouse at bedside. Spouse will transport home. He has home oxygen in car, and already received rollator. No additional DME needs. They do not have a preference for [facility] services.  Referral pending to [facility], through [employer].  Patient can DC home when ready, no other TOC needs identified.   [facility] accepted    Barriers to Discharge: Continued Medical Work up   Patient Goals and CMS Choice   CMS Medicare.gov Compare Post Acute Care list provided to:: Patient Choice offered to / list presented to : Patient  Discharge Placement                       Discharge Plan and Services   Discharge Planning Services: CM Consult Post Acute Care Choice: Home Health                               Social Determinants of Health (SDOH) Interventions     Readmission Risk Interventions     No data to display

## 2022-06-07 NOTE — Discharge Summary (Signed)
Physician Discharge Summary  Linda Stevens YCX:[id number] DOB: August 31, 1946 DOA: 06/02/2022  PCP: [name], MD  Admit date: 06/02/2022 Discharge date: 06/07/2022  Time spent: 45 minutes  Recommendations for Outpatient Follow-up:  Advanced heart failure team in 1 to 2 weeks, consider palliative care referral Home health PT RN Left upper lobe density, needs follow-up   Discharge Diagnoses:  Principal Problem:   Acute on chronic diastolic CHF (congestive heart failure) (HCC) Right heart failure Pulmonary hypertension Advanced COPD   Acute on chronic respiratory failure with hypoxia and hypercapnia (HCC)    Hepatic cirrhosis (HCC)   Stage 3b chronic kidney disease (CKD) (HCC) Paroxysmal atrial fibrillation Recurrent GI bleed Iron deficiency anemia  Discharge Condition: Stable  Diet recommendation: Low sodium, heart healthy  Filed Weights   06/05/22 0403 06/06/22 0331 06/07/22 0446  Weight: 60.1 kg 60.2 kg 60.8 kg    History of present illness:  76/F with history of COPD, pulmonary hypertension, diastolic CHF, chronic respiratory failure on 4 to 5 L home O2, paroxysmal A-fib off anticoagulation for recurrent GI bleed scented to the ED with progressive dyspnea on exertion fatigue and cough X 2 weeks. -In the emergency room she was tachypneic, SBP was 96, O2 sats 100% on 6 L chest x-ray noted pulmonary vascular congestion, interstitial edema and small pleural effusions, hemoglobin was 9, BNP was 1663, D-dimer was elevated, CT chest noted coronary artery disease. Cardiomegaly. COPD. Patchy infiltrates in both lungs, suggesting atelectasis/pneumonia. Small to moderate bilateral pleural effusions, There is 1.7 x 0.8 cm pleural-based density in the medial left upper lobe which may suggest atelectasis/pneumonia or neoplastic process. -Echo noted EF 70-75%, moderate LVH, grade 2 DD    Hospital Course:   Acute hypoxic respiratory failure Acute on chronic diastolic CHF, RV  failure  Pulm HTN -Echo this admission EF 70-75%, moderate LVH, grade 2 diastolic dysfunction, PA pressures could not be assessed -Advanced heart failure team consulted -Diuresed with IV Lasix  -Marginal BP, mild AKI limiting GDMT, -Cleared for discharge, Lasix 40 Mg twice daily and Aldactone resumed -Continue tadalafil and Ambrisentan -Ambulate, PT OT eval-> Home health PT recommended -Follow-up in heart failure clinic, further GDMT as blood pressure tolerates   COPD, chronic respiratory failure -On 4 to 5 L home O2 at baseline -Continue DuoNebs, prednisone-taper-for 3 more days, cefdinir x5days total -cannot r/o Pneumonia vs Atx on CT chest -Improving   P.Atrial fibrillation -Eliquis discontinued in 2021 for severe GI bleed from AVMs -In sinus rhythm, off amio   Cardiac cirrhosis -Likely secondary to right heart failure, hep serologies previously negative -Albumin is 3.2   History of chronic GI bleeds/AVM History of chronic anemia -Hemoglobin is stable, monitor   Left upper lobe density -Incidentally noted on imaging, will need repeat CT chest in 3 to 6 months   Consultations: Advanced heart failure team  Discharge Exam: Vitals:   06/06/22 2037 06/07/22 0446  BP: (!) 103/46 (!) 99/47  Pulse: 75 75  Resp: 19 18  Temp: 98.2 F (36.8 C) 98.2 F (36.8 C)  SpO2: 100% 100%   General exam: Chronically ill female appears older than stated age, AAOx3, no distress HEENT: No JVD CVS: S1-S2, regular rhythm Lungs: Improved air movement, clear Abdomen: Soft, nontender, bowel sounds present Extremities: No edema Skin: No rashes Psychiatry:  Mood & affect appropriate.  Discharge Instructions   Discharge Instructions     Diet - low sodium heart healthy   Complete by: As directed    Increase activity slowly  Complete by: As directed       Allergies as of 06/07/2022       Reactions   Amoxicillin-pot Clavulanate Other (See Comments)   REACTION: gi upset Has  patient had a PCN reaction causing immediate rash, facial/tongue/throat swelling, SOB or lightheadedness with hypotension: No Has patient had a PCN reaction causing severe rash involving mucus membranes or skin necrosis: No Has patient had a PCN reaction that required hospitalization : No Has patient had a PCN reaction occurring within the last 10 years: No If all of the above answers are "NO", then may proceed with Cephalosporin use.   Cefdinir Other (See Comments)   gi  upset   Singulair [montelukast Sodium] Itching      Moxifloxacin Rash        Medication List     TAKE these medications    acetaminophen 325 MG tablet Commonly known as: TYLENOL Take 325 mg by mouth as needed for mild pain.   albuterol 108 (90 Base) MCG/ACT inhaler Commonly known as: VENTOLIN HFA INHALE 2 PUFFS INTO THE LUNGS EVERY 6 HOURS AS NEEDED FOR WHEEZING/SHORTNESS OF BREATH What changed: See the new instructions.   ambrisentan 10 MG tablet Commonly known as: LETAIRIS TAKE 1 TABLET BY MOUTH 1 TIME A DAY. DO NOT HANDLE IF PREGNANT.Please make appointment for further refills What changed:  how much to take how to take this when to take this additional instructions   aspirin EC 81 MG tablet Take 1 tablet (81 mg total) by mouth daily.   cefdinir 300 MG capsule Commonly known as: OMNICEF Take 1 capsule (300 mg total) by mouth daily for 2 days.   ferrous sulfate 325 (65 FE) MG EC tablet TAKE 1 TABLET BY MOUTH TWICE A DAY WITH MEALS What changed: when to take this   fluticasone 50 MCG/ACT nasal spray Commonly known as: FLONASE Place 1 spray into both nostrils daily. What changed:  when to take this reasons to take this   furosemide 40 MG tablet Commonly known as: LASIX TAKE 1 TABLET BY MOUTH TWICE A DAY What changed: when to take this   hydrOXYzine 50 MG tablet Commonly known as: ATARAX TAKE 1/2 TO 1 TABLET BY MOUTH AT BEDTIME FOR ANXIETY What changed: See the new instructions.    Klor-Con M20 20 MEQ tablet Generic drug: potassium chloride SA TAKE 1 TABLET BY MOUTH EVERY DAY What changed: how much to take   levothyroxine 75 MCG tablet Commonly known as: SYNTHROID TAKE 1 TABLET BY MOUTH EVERY DAY What changed: when to take this   loratadine 10 MG tablet Commonly known as: CLARITIN TAKE 1 TABLET BY MOUTH EVERY DAY   OXYGEN Inhale 6 L into the lungs continuous.   pantoprazole 40 MG tablet Commonly known as: PROTONIX Take 40 mg by mouth daily.   polyvinyl alcohol 1.4 % ophthalmic solution Commonly known as: LIQUIFILM TEARS Place 1 drop into both eyes daily as needed for dry eyes.   predniSONE 20 MG tablet Commonly known as: DELTASONE Take 1 tablet (20 mg total) by mouth daily with breakfast for 3 days. Start taking on: June 08, 2022   spironolactone 25 MG tablet Commonly known as: ALDACTONE TAKE 1/2 TABLET BY MOUTH EVERY DAY   tadalafil 20 MG tablet Commonly known as: Cialis Take 2 tablets (40 mg total) by mouth daily. Please schedule an appointment for further refills What changed: additional instructions               Durable Medical  Equipment  (From admission, onward)           Start     Ordered   06/06/22 1312  For home use only DME 4 wheeled rolling walker with seat  Once       Question:  Patient needs a walker to treat with the following condition  Answer:  Abnormal gait   06/06/22 1312           Allergies  Allergen Reactions   Amoxicillin-Pot Clavulanate Other (See Comments)    REACTION: gi upset Has patient had a PCN reaction causing immediate rash, facial/tongue/throat swelling, SOB or lightheadedness with hypotension: No Has patient had a PCN reaction causing severe rash involving mucus membranes or skin necrosis: No Has patient had a PCN reaction that required hospitalization : No Has patient had a PCN reaction occurring within the last 10 years: No If all of the above answers are "NO", then may proceed with  Cephalosporin use.    Cefdinir Other (See Comments)    gi  upset   Singulair [Montelukast Sodium] Itching        Moxifloxacin Rash    Follow-up Information     [facility] Follow up on 06/19/2022.   Specialty: Cardiology Why: Follow up in [facility] October 12th at 2 pm.   Please bring all medications with you. Entrance C, free valet Contact information: [address] [id number] [place] [place] Follow up.   Specialty: Home Health Services Contact information: [address] [place] [address] [phone]                  The results of significant diagnostics from this hospitalization (including imaging, microbiology, ancillary and laboratory) are listed below for reference.    Significant Diagnostic Studies: ECHOCARDIOGRAM COMPLETE  Result Date: 06/03/2022    ECHOCARDIOGRAM REPORT   Patient Name:   SUMMERLYN FICKEL Date of Exam: 06/03/2022 Medical Rec #:  [id number]           Height:       62.0 in Accession #:    [id number]          Weight:       134.9 lb Date of Birth:  19-May-1946           BSA:          1.617 m Patient Age:    24 years            BP:           119/51 mmHg Patient Gender: F                   HR:           84 bpm. Exam Location:  Inpatient Procedure: 2D Echo, Cardiac Doppler and Color Doppler Indications:    CHF-acute diastolic  History:        Patient has prior history of Echocardiogram examinations, most                 recent 04/14/2021. CHF, COPD and Pulmonary HTN, Arrythmias:Atrial                 Fibrillation, Signs/Symptoms:Shortness of Breath; Risk                 Factors:Former Smoker and Hypertension.  Sonographer:    Arnell Sieving  Danelle Berry (AE) Referring Phys: [id number] TIMOTHY S OPYD IMPRESSIONS  1. Left ventricular ejection fraction, by estimation, is 70 to 75%. The left ventricle has  hyperdynamic function. Left ventricular endocardial border not optimally defined to evaluate regional wall motion. There is moderate concentric left ventricular hypertrophy. Left ventricular diastolic parameters are consistent with Grade II diastolic dysfunction (pseudonormalization). Elevated left atrial pressure.  2. Right ventricular systolic function is normal. The right ventricular size is normal. Tricuspid regurgitation signal is inadequate for assessing PA pressure.  3. Left atrial size was mildly dilated.  4. A small pericardial effusion is present.  5. Mitral Valve Area (MVA) = 1.74 cm2, but elevated gradients may be related to hyperdynamic ventricle. The mitral valve is grossly normal. No evidence of mitral valve regurgitation. Mild mitral stenosis. The mean mitral valve gradient is 5.0 mmHg with average heart rate of 82 bpm.  6. The aortic valve was not well visualized. Aortic valve regurgitation is not visualized. Aortic valve sclerosis is present, with no evidence of aortic valve stenosis.  7. The inferior vena cava is dilated in size with <50% respiratory variability, suggesting right atrial pressure of 15 mmHg. Comparison(s): No significant change from prior study. FINDINGS  Left Ventricle: Left ventricular ejection fraction, by estimation, is 70 to 75%. The left ventricle has hyperdynamic function. Left ventricular endocardial border not optimally defined to evaluate regional wall motion. The left ventricular internal cavity size was normal in size. There is moderate concentric left ventricular hypertrophy. Left ventricular diastolic parameters are consistent with Grade II diastolic dysfunction (pseudonormalization). Elevated left atrial pressure. Right Ventricle: The right ventricular size is normal. No increase in right ventricular wall thickness. Right ventricular systolic function is normal. Tricuspid regurgitation signal is inadequate for assessing PA pressure. Left Atrium: Left atrial size was  mildly dilated. Right Atrium: Right atrial size was normal in size. Pericardium: A small pericardial effusion is present. Mitral Valve: Mitral Valve Area (MVA) = 1.74 cm2, but elevated gradients may be related to hyperdynamic ventricle. The mitral valve is grossly normal. Mild mitral annular calcification. No evidence of mitral valve regurgitation. Mild mitral valve stenosis. MV peak gradient, 10.9 mmHg. The mean mitral valve gradient is 5.0 mmHg with average heart rate of 82 bpm. Tricuspid Valve: The tricuspid valve is normal in structure. Tricuspid valve regurgitation is not demonstrated. No evidence of tricuspid stenosis. Aortic Valve: The aortic valve was not well visualized. Aortic valve regurgitation is not visualized. Aortic valve sclerosis is present, with no evidence of aortic valve stenosis. Aortic valve mean gradient measures 9.0 mmHg. Aortic valve peak gradient measures 15.5 mmHg. Aortic valve area, by VTI measures 1.82 cm. Pulmonic Valve: The pulmonic valve was not well visualized. Pulmonic valve regurgitation is not visualized. Aorta: The aortic root and ascending aorta are structurally normal, with no evidence of dilitation. Venous: The inferior vena cava is dilated in size with less than 50% respiratory variability, suggesting right atrial pressure of 15 mmHg. IAS/Shunts: No atrial level shunt detected by color flow Doppler.  LEFT VENTRICLE PLAX 2D LVIDd:         4.10 cm   Diastology LVIDs:         2.60 cm   LV e' medial:    6.64 cm/s LV PW:         1.60 cm   LV E/e' medial:  20.2 LV IVS:        1.30 cm   LV e' lateral:   6.53 cm/s LVOT diam:  1.80 cm   LV E/e' lateral: 20.5 LV SV:         76 LV SV Index:   47 LVOT Area:     2.54 cm  RIGHT VENTRICLE             IVC RV Basal diam:  3.00 cm     IVC diam: 2.20 cm RV S prime:     10.40 cm/s TAPSE (M-mode): 1.4 cm LEFT ATRIUM             Index        RIGHT ATRIUM           Index LA diam:        3.80 cm 2.35 cm/m   RA Area:     16.10 cm LA Vol  (A2C):   40.8 ml 25.23 ml/m  RA Volume:   31.30 ml  19.35 ml/m LA Vol (A4C):   65.7 ml 40.63 ml/m LA Biplane Vol: 52.2 ml 32.28 ml/m  AORTIC VALVE AV Area (Vmax):    1.82 cm AV Area (Vmean):   1.77 cm AV Area (VTI):     1.82 cm AV Vmax:           197.00 cm/s AV Vmean:          136.000 cm/s AV VTI:            0.419 m AV Peak Grad:      15.5 mmHg AV Mean Grad:      9.0 mmHg LVOT Vmax:         141.00 cm/s LVOT Vmean:        94.700 cm/s LVOT VTI:          0.299 m LVOT/AV VTI ratio: 0.71  AORTA Ao Root diam: 2.80 cm Ao Asc diam:  3.10 cm MITRAL VALVE MV Area (PHT): 2.77 cm     SHUNTS MV Area VTI:   1.73 cm     Systemic VTI:  0.30 m MV Peak grad:  10.9 mmHg    Systemic Diam: 1.80 cm MV Mean grad:  5.0 mmHg MV Vmax:       1.65 m/s MV Vmean:      102.0 cm/s MV Decel Time: 274 msec MV E velocity: 134.00 cm/s MV A velocity: 117.00 cm/s MV E/A ratio:  1.15 Rudean Haskell MD Electronically signed by Rudean Haskell MD Signature Date/Time: 06/03/2022/3:32:29 PM    Final    VAS [place] LOWER EXTREMITY VENOUS (DVT)  Result Date: 06/03/2022  Lower Venous DVT Study Patient Name:  ANDREY HOOBLER  Date of Exam:   06/03/2022 Medical Rec #: [id number]            Accession #:    [id number] Date of Birth: October 14, 1945            Patient Gender: F Patient Age:   62 years Exam Location:  [facility] Procedure:      VAS [place] LOWER EXTREMITY VENOUS (DVT) Referring Phys: A POWELL JR --------------------------------------------------------------------------------  Indications: Elevated Ddimer.  Risk Factors: None identified. Limitations: Poor ultrasound/tissue interface and patient pain tolerance. Comparison Study: No prior studies. Performing Technologist: Oliver Hum RVT  Examination Guidelines: A complete evaluation includes B-mode imaging, spectral Doppler, color Doppler, and power Doppler as needed of all accessible portions of each vessel. Bilateral testing is considered an integral part of a complete  examination. Limited examinations for reoccurring indications may be performed as noted. The reflux portion of the exam is performed with the patient  in reverse Trendelenburg.  +---------+---------------+---------+-----------+----------+--------------+ RIGHT    CompressibilityPhasicitySpontaneityPropertiesThrombus Aging +---------+---------------+---------+-----------+----------+--------------+ CFV      Full           Yes      Yes                                 +---------+---------------+---------+-----------+----------+--------------+ SFJ      Full                                                        +---------+---------------+---------+-----------+----------+--------------+ FV Prox  Full                                                        +---------+---------------+---------+-----------+----------+--------------+ FV Mid   Full                                                        +---------+---------------+---------+-----------+----------+--------------+ FV DistalFull                                                        +---------+---------------+---------+-----------+----------+--------------+ PFV      Full                                                        +---------+---------------+---------+-----------+----------+--------------+ POP      Full           Yes      Yes                                 +---------+---------------+---------+-----------+----------+--------------+ PTV      Full                                                        +---------+---------------+---------+-----------+----------+--------------+ PERO     Full                                                        +---------+---------------+---------+-----------+----------+--------------+   +---------+---------------+---------+-----------+----------+--------------+ LEFT     CompressibilityPhasicitySpontaneityPropertiesThrombus Aging  +---------+---------------+---------+-----------+----------+--------------+ CFV      Full           Yes      Yes                                 +---------+---------------+---------+-----------+----------+--------------+  SFJ      Full                                                        +---------+---------------+---------+-----------+----------+--------------+ FV Prox  Full                                                        +---------+---------------+---------+-----------+----------+--------------+ FV Mid   Full                                                        +---------+---------------+---------+-----------+----------+--------------+ FV DistalFull                                                        +---------+---------------+---------+-----------+----------+--------------+ PFV      Full                                                        +---------+---------------+---------+-----------+----------+--------------+ POP      Full           Yes      Yes                                 +---------+---------------+---------+-----------+----------+--------------+ PTV      Full                                                        +---------+---------------+---------+-----------+----------+--------------+ PERO     Full                                                        +---------+---------------+---------+-----------+----------+--------------+     Summary: RIGHT: - There is no evidence of deep vein thrombosis in the lower extremity. However, portions of this examination were limited- see technologist comments above.  - No cystic structure found in the popliteal fossa.  LEFT: - There is no evidence of deep vein thrombosis in the lower extremity. However, portions of this examination were limited- see technologist comments above.  - No cystic structure found in the popliteal fossa.  *See table(s) above for measurements and observations.  Electronically signed by Deitra Mayo MD on 06/03/2022 at 1:08:09 PM.    Final    CT Angio Chest Pulmonary Embolism (PE) W or WO Contrast  Result Date: 06/03/2022 CLINICAL DATA:  Positive D-dimer, dyspnea on exertion EXAM: CT ANGIOGRAPHY CHEST WITH CONTRAST TECHNIQUE: Multidetector CT imaging of the chest was performed using the standard protocol during bolus administration of intravenous contrast. Multiplanar CT image reconstructions and MIPs were obtained to evaluate the vascular anatomy. RADIATION DOSE REDUCTION: This exam was performed according to the departmental dose-optimization program which includes automated exposure control, adjustment of the mA and/or kV according to patient size and/or use of iterative reconstruction technique. CONTRAST:  70m OMNIPAQUE IOHEXOL 350 MG/ML SOLN COMPARISON:  CT done on 09/10/2021, chest radiograph done on 06/02/2022 FINDINGS: Cardiovascular: There is homogeneous enhancement in thoracic aorta. There is focal ectasia of lower course of descending thoracic aorta measuring 3.2 cm. Coronary artery calcifications are seen. There is ectasia of main pulmonary artery measuring 4 cm. There are no intraluminal filling defects in pulmonary artery branches. Evaluation of small peripheral branches in the lower lung fields is limited by infiltrates. Heart is enlarged in size. Mediastinum/Nodes: There are slightly enlarged lymph nodes in mediastinum and hilar regions with no significant change. Lungs/Pleura: Centrilobular emphysema is seen. There are patchy linear infiltrates in right middle lobe, lingula and both lower lobes, more so in right lower lobe. In image 27 of series 6, there is 1.7 x 0.8 cm nodular density in the medial left upper lung field. Small to moderate bilateral pleural effusions are seen, more so on the right side. There is mild thickening of pleura in the periphery of right lower lung field. There is no pneumothorax. Upper Abdomen: There is mild nodularity  in liver surface suggesting possible cirrhosis. Musculoskeletal: There is mild decrease in height of upper endplates of bodies of TV76and T12 vertebrae with no significant interval change. Review of the MIP images confirms the above findings. IMPRESSION: There is no evidence of pulmonary artery embolism. There is ectasia of main pulmonary artery suggesting pulmonary arterial hypertension. There is no evidence of thoracic aortic dissection. There is aneurysmal dilation in the descending thoracic aorta measuring 3.2 cm. Coronary artery disease. Cardiomegaly. COPD. Patchy infiltrates are seen in both lungs, more so in right lower lobe suggesting atelectasis/pneumonia. Small to moderate bilateral pleural effusions are seen, more so on the right side with interval increase. There is 1.7 x 0.8 cm pleural-based density in the medial left upper lobe which may suggest atelectasis/pneumonia or neoplastic process. Short-term follow-up CT chest in 3 months should be considered to re-evaluate this finding. If the density is persistent, PET-CT should be considered. Other findings as described in the body of the report. Electronically Signed   By: PElmer PickerM.D.   On: 06/03/2022 12:50   DG Chest Port 1 View  Result Date: 06/02/2022 CLINICAL DATA:  Shortness of breath EXAM: PORTABLE CHEST 1 VIEW COMPARISON:  08/19/2021, CT 09/10/2021 FINDINGS: Cardiomegaly with small bilateral pleural effusions. Vascular congestion and mild diffuse interstitial edema. There is emphysema. Basilar airspace disease. IMPRESSION: 1. Cardiomegaly with vascular congestion, mild interstitial edema and small bilateral effusions 2. Emphysema. 3. Basilar airspace disease, atelectasis versus pneumonia Electronically Signed   By: KDonavan FoilM.D.   On: 06/02/2022 16:04    Microbiology: No results found for this or any previous visit (from the past 240 hour(s)).   Labs: Basic Metabolic Panel: Recent Labs  Lab 06/02/22 1730 06/03/22 0355  06/04/22 0055 06/05/22 0029 06/06/22 0022 06/07/22 0107  NA  --  143 142 144 140 141  K  --  4.1 4.0 3.8 4.0 4.1  CL  --  94* 94* 94* 92* 95*  CO2  --  36* 38* 41* 38* 37*  GLUCOSE  --  125* 100* 96 158* 100*  BUN  --  20 31* 36* 34* 40*  CREATININE  --  1.51* 1.71* 1.68* 1.76* 2.20*  CALCIUM  --  8.8* 8.4* 8.5* 8.3* 8.1*  MG 2.1 2.1 2.3  --   --   --   PHOS  --   --  3.6  --   --   --    Liver Function Tests: Recent Labs  Lab 06/02/22 1411 06/03/22 0939  AST 18 37  ALT 10 14  ALKPHOS 85 71  BILITOT 0.6 1.1  PROT 6.6 6.4*  ALBUMIN 3.6 3.2*   No results for input(s): "LIPASE", "AMYLASE" in the last 168 hours. No results for input(s): "AMMONIA" in the last 168 hours. CBC: Recent Labs  Lab 06/02/22 1411 06/02/22 1658 06/03/22 0355 06/04/22 0055 06/05/22 0029 06/07/22 0107  WBC 3.9*  --  3.9* 4.5 5.4 4.5  NEUTROABS 2.6  --   --  3.3  --   --   HGB 9.0* 10.2* 9.1* 8.7* 9.1* 8.4*  HCT 27.2* 30.0* 29.5* 28.3* 29.9* 26.5*  MCV 99.8  --  105.4* 104.0* 105.3* 103.5*  PLT 117.0*  --  120* 133* 140* 127*   Cardiac Enzymes: No results for input(s): "CKTOTAL", "CKMB", "CKMBINDEX", "TROPONINI" in the last 168 hours. BNP: BNP (last 3 results) Recent Labs    06/02/22 1731  BNP 1,663.8*    ProBNP (last 3 results) No results for input(s): "PROBNP" in the last 8760 hours.  CBG: No results for input(s): "GLUCAP" in the last 168 hours.     Signed:  Domenic Polite MD.  [facility] 06/07/2022, 11:03 AM

## 2022-06-09 ENCOUNTER — Telehealth: Payer: Self-pay | Admitting: *Deleted

## 2022-06-09 ENCOUNTER — Other Ambulatory Visit (HOSPITAL_COMMUNITY): Payer: Self-pay | Admitting: Internal Medicine

## 2022-06-09 NOTE — Patient Outreach (Signed)
  Care Coordination [facility] Note Transition Care Management Follow-up Telephone Call Date of discharge and from where: [phone] How have you been since you were released from the hospital? Doing good Any questions or concerns? No  Items Reviewed: Did the pt receive and understand the discharge instructions provided? Yes  Medications obtained and verified? Yes  Other? No  Any new allergies since your discharge? No  Dietary orders reviewed? Yes low sodium heart healthy Do you have support at home? Yes   Home Care and Equipment/Supplies: Were home health services ordered? yes If so, what is the name of the agency? [facility]  Has the agency set up a time to come to the patient's home? No RN discussed with patient husband about calling and find out when they will be coming out. Per husband he stated he would. Were any new equipment or medical supplies ordered?  No What is the name of the medical supply agency? N  Were you able to get the supplies/equipment? not applicable Do you have any questions related to the use of the equipment or supplies? No  Functional Questionnaire: (I = Independent and D = Dependent) ADLs: D  Bathing/Dressing- D  Meal Prep- D  Eating- I  Maintaining continence- I  Transferring/Ambulation- I  Managing Meds- I  Follow up appointments reviewed:  PCP Hospital f/u appt confirmed? No  . [facility] f/u appt confirmed? Yes  Scheduled to see [facility] [phone] 2 PM  Are transportation arrangements needed? No  If their condition worsens, is the pt aware to call PCP or go to the Emergency Dept.? Yes Was the patient provided with contact information for the PCP's office or ED? Yes Was to pt encouraged to call back with questions or concerns? Yes  SDOH assessments and interventions completed:   Yes  Care Coordination Interventions Activated:  Yes   Care Coordination Interventions:  No Care Coordination interventions needed at  this time.   Encounter Outcome:  Pt. Visit Completed    [place] Management [phone]

## 2022-06-10 DIAGNOSIS — J9622 Acute and chronic respiratory failure with hypercapnia: Secondary | ICD-10-CM | POA: Diagnosis not present

## 2022-06-10 DIAGNOSIS — I480 Paroxysmal atrial fibrillation: Secondary | ICD-10-CM | POA: Diagnosis not present

## 2022-06-10 DIAGNOSIS — N1831 Chronic kidney disease, stage 3a: Secondary | ICD-10-CM | POA: Diagnosis not present

## 2022-06-10 DIAGNOSIS — E785 Hyperlipidemia, unspecified: Secondary | ICD-10-CM | POA: Diagnosis not present

## 2022-06-10 DIAGNOSIS — K761 Chronic passive congestion of liver: Secondary | ICD-10-CM | POA: Diagnosis not present

## 2022-06-10 DIAGNOSIS — D696 Thrombocytopenia, unspecified: Secondary | ICD-10-CM | POA: Diagnosis not present

## 2022-06-10 DIAGNOSIS — I2510 Atherosclerotic heart disease of native coronary artery without angina pectoris: Secondary | ICD-10-CM | POA: Diagnosis not present

## 2022-06-10 DIAGNOSIS — Z7982 Long term (current) use of aspirin: Secondary | ICD-10-CM | POA: Diagnosis not present

## 2022-06-10 DIAGNOSIS — J9621 Acute and chronic respiratory failure with hypoxia: Secondary | ICD-10-CM | POA: Diagnosis not present

## 2022-06-10 DIAGNOSIS — I5033 Acute on chronic diastolic (congestive) heart failure: Secondary | ICD-10-CM | POA: Diagnosis not present

## 2022-06-10 DIAGNOSIS — M1990 Unspecified osteoarthritis, unspecified site: Secondary | ICD-10-CM | POA: Diagnosis not present

## 2022-06-10 DIAGNOSIS — J4489 Other specified chronic obstructive pulmonary disease: Secondary | ICD-10-CM | POA: Diagnosis not present

## 2022-06-10 DIAGNOSIS — E039 Hypothyroidism, unspecified: Secondary | ICD-10-CM | POA: Diagnosis not present

## 2022-06-10 DIAGNOSIS — Z8701 Personal history of pneumonia (recurrent): Secondary | ICD-10-CM | POA: Diagnosis not present

## 2022-06-10 DIAGNOSIS — Z79899 Other long term (current) drug therapy: Secondary | ICD-10-CM | POA: Diagnosis not present

## 2022-06-10 DIAGNOSIS — I130 Hypertensive heart and chronic kidney disease with heart failure and stage 1 through stage 4 chronic kidney disease, or unspecified chronic kidney disease: Secondary | ICD-10-CM | POA: Diagnosis not present

## 2022-06-10 DIAGNOSIS — Z9181 History of falling: Secondary | ICD-10-CM | POA: Diagnosis not present

## 2022-06-10 DIAGNOSIS — Z87440 Personal history of urinary (tract) infections: Secondary | ICD-10-CM | POA: Diagnosis not present

## 2022-06-10 DIAGNOSIS — Z87891 Personal history of nicotine dependence: Secondary | ICD-10-CM | POA: Diagnosis not present

## 2022-06-10 DIAGNOSIS — Z9981 Dependence on supplemental oxygen: Secondary | ICD-10-CM | POA: Diagnosis not present

## 2022-06-10 DIAGNOSIS — D6859 Other primary thrombophilia: Secondary | ICD-10-CM | POA: Diagnosis not present

## 2022-06-10 DIAGNOSIS — I272 Pulmonary hypertension, unspecified: Secondary | ICD-10-CM | POA: Diagnosis not present

## 2022-06-10 DIAGNOSIS — J439 Emphysema, unspecified: Secondary | ICD-10-CM | POA: Diagnosis not present

## 2022-06-10 DIAGNOSIS — D631 Anemia in chronic kidney disease: Secondary | ICD-10-CM | POA: Diagnosis not present

## 2022-06-11 ENCOUNTER — Telehealth (HOSPITAL_COMMUNITY): Payer: Self-pay | Admitting: *Deleted

## 2022-06-11 NOTE — Telephone Encounter (Signed)
Pts daughter left vm for return call about mediations. I called her back no answer/left vm.

## 2022-06-13 ENCOUNTER — Encounter (HOSPITAL_COMMUNITY): Payer: Self-pay | Admitting: Internal Medicine

## 2022-06-19 ENCOUNTER — Encounter (HOSPITAL_COMMUNITY): Payer: Self-pay

## 2022-06-19 ENCOUNTER — Ambulatory Visit (HOSPITAL_COMMUNITY)
Admit: 2022-06-19 | Discharge: 2022-06-19 | Disposition: A | Discharge status: Home / Self Care | Payer: Medicare HMO | Source: Ambulatory Visit | Attending: Family Medicine | Admitting: Family Medicine

## 2022-06-19 VITALS — BP 97/36 | HR 72 | Wt 134.6 lb

## 2022-06-19 DIAGNOSIS — I2510 Atherosclerotic heart disease of native coronary artery without angina pectoris: Secondary | ICD-10-CM | POA: Diagnosis not present

## 2022-06-19 DIAGNOSIS — J90 Pleural effusion, not elsewhere classified: Secondary | ICD-10-CM | POA: Insufficient documentation

## 2022-06-19 DIAGNOSIS — J449 Chronic obstructive pulmonary disease, unspecified: Secondary | ICD-10-CM | POA: Diagnosis not present

## 2022-06-19 DIAGNOSIS — Z79899 Other long term (current) drug therapy: Secondary | ICD-10-CM | POA: Insufficient documentation

## 2022-06-19 DIAGNOSIS — K7460 Unspecified cirrhosis of liver: Secondary | ICD-10-CM | POA: Insufficient documentation

## 2022-06-19 DIAGNOSIS — E875 Hyperkalemia: Secondary | ICD-10-CM | POA: Insufficient documentation

## 2022-06-19 DIAGNOSIS — J440 Chronic obstructive pulmonary disease with acute lower respiratory infection: Secondary | ICD-10-CM | POA: Diagnosis not present

## 2022-06-19 DIAGNOSIS — I272 Pulmonary hypertension, unspecified: Secondary | ICD-10-CM | POA: Diagnosis not present

## 2022-06-19 DIAGNOSIS — I509 Heart failure, unspecified: Secondary | ICD-10-CM

## 2022-06-19 DIAGNOSIS — J9610 Chronic respiratory failure, unspecified whether with hypoxia or hypercapnia: Secondary | ICD-10-CM | POA: Insufficient documentation

## 2022-06-19 DIAGNOSIS — J9611 Chronic respiratory failure with hypoxia: Secondary | ICD-10-CM

## 2022-06-19 DIAGNOSIS — M7989 Other specified soft tissue disorders: Secondary | ICD-10-CM | POA: Diagnosis not present

## 2022-06-19 DIAGNOSIS — R5381 Other malaise: Secondary | ICD-10-CM | POA: Diagnosis not present

## 2022-06-19 DIAGNOSIS — Z9981 Dependence on supplemental oxygen: Secondary | ICD-10-CM | POA: Diagnosis not present

## 2022-06-19 DIAGNOSIS — Z87891 Personal history of nicotine dependence: Secondary | ICD-10-CM | POA: Insufficient documentation

## 2022-06-19 DIAGNOSIS — I110 Hypertensive heart disease with heart failure: Secondary | ICD-10-CM | POA: Diagnosis not present

## 2022-06-19 DIAGNOSIS — L409 Psoriasis, unspecified: Secondary | ICD-10-CM | POA: Insufficient documentation

## 2022-06-19 DIAGNOSIS — I3139 Other pericardial effusion (noninflammatory): Secondary | ICD-10-CM | POA: Insufficient documentation

## 2022-06-19 DIAGNOSIS — F419 Anxiety disorder, unspecified: Secondary | ICD-10-CM | POA: Insufficient documentation

## 2022-06-19 DIAGNOSIS — J441 Chronic obstructive pulmonary disease with (acute) exacerbation: Secondary | ICD-10-CM | POA: Diagnosis not present

## 2022-06-19 DIAGNOSIS — J984 Other disorders of lung: Secondary | ICD-10-CM

## 2022-06-19 DIAGNOSIS — R69 Illness, unspecified: Secondary | ICD-10-CM | POA: Diagnosis not present

## 2022-06-19 DIAGNOSIS — I2721 Secondary pulmonary arterial hypertension: Secondary | ICD-10-CM

## 2022-06-19 DIAGNOSIS — I5032 Chronic diastolic (congestive) heart failure: Secondary | ICD-10-CM | POA: Diagnosis not present

## 2022-06-19 DIAGNOSIS — I480 Paroxysmal atrial fibrillation: Secondary | ICD-10-CM | POA: Diagnosis not present

## 2022-06-19 LAB — BASIC METABOLIC PANEL
Anion gap: 9 (ref 5–15)
BUN: 19 mg/dL (ref 8–23)
CO2: 34 mmol/L — ABNORMAL HIGH (ref 22–32)
Calcium: 9.1 mg/dL (ref 8.9–10.3)
Chloride: 101 mmol/L (ref 98–111)
Creatinine, Ser: 1.47 mg/dL — ABNORMAL HIGH (ref 0.44–1.00)
GFR, Estimated: 37 mL/min — ABNORMAL LOW (ref >60)
Glucose, Bld: 114 mg/dL — ABNORMAL HIGH (ref 70–99)
Potassium: 4.1 mmol/L (ref 3.5–5.1)
Sodium: 144 mmol/L (ref 135–145)

## 2022-06-19 LAB — BRAIN NATRIURETIC PEPTIDE: B Natriuretic Peptide: 1099.7 pg/mL — ABNORMAL HIGH (ref 0.0–100.0)

## 2022-06-19 MED ORDER — MIDODRINE HCL 2.5 MG PO TABS: 2.5000 mg | ORAL_TABLET | Freq: Three times a day (TID) | ORAL | 3 refills | Status: AC

## 2022-06-19 NOTE — Patient Instructions (Signed)
START Midodrine  2.5 mg one tab three times a day  Labs today We will only contact you if something comes back abnormal or we need to make some changes. Otherwise no news is good news!   Your physician wants you to follow-up in: 6 months with Dr Haroldine Laws. You will receive a reminder letter in the mail two months in advance. If you don't receive a letter, please call our office to schedule the follow-up appointment in April 2024.   Do the following things EVERYDAY: Weigh yourself in the morning before breakfast. Write it down and keep it in a log. Take your medicines as prescribed Eat low salt foods--Limit salt (sodium) to 2000 mg per day.  Stay as active as you can everyday Limit all fluids for the day to less than 2 liters   At the [facility], you and your health needs are our priority. As part of our continuing mission to provide you with exceptional heart care, we have created designated Provider Care Teams. These Care Teams include your primary Cardiologist (physician) and Advanced Practice Providers (APPs- Physician Assistants and Nurse Practitioners) who all work together to provide you with the care you need, when you need it.   You may see any of the following providers on your designated Care Team at your next follow up: Dr [name] Dr Loralie Champagne Dr. Roxana Hires, NP Lyda Jester, [place] [facility], [place] Forestine Na, NP Audry Riles, PharmD   Please be sure to bring in all your medications bottles to every appointment.     If you have any questions or concerns before your next appointment please send [place] a message through [place] or call our office at [phone].    TO LEAVE A MESSAGE FOR THE NURSE SELECT OPTION 2, PLEASE LEAVE A MESSAGE INCLUDING: YOUR NAME DATE OF BIRTH CALL BACK NUMBER REASON FOR CALL**this is important as we prioritize the call backs  YOU WILL RECEIVE A CALL BACK THE SAME DAY AS  LONG AS YOU CALL BEFORE 4:00 PM

## 2022-06-19 NOTE — Progress Notes (Addendum)
ADVANCED HF CLINIC NOTE   PCP: Dr. Eliezer Lofts Pulmonary: Dr. Melvyn Novas HF Cardiology: Dr Haroldine Laws   HPI: Linda Stevens is  76 y.o.woman with a h/o HTN, COPD (former smoker quit 2015), seasonal allergies, asthma, pulmonary HTN, and diastolic dysfxn.  Admitted 1/17 with progressive DOE, hypoxia, and lower ext edema and found to have severe PAH.  R/LHC 10/08/15 with normal coronaries, normal CO, and severe PAH PAP 104/49 with 13.6 WU.  PAH well out of proportion to left-sided filling pressures and COPD. Started revatio 20 TID with consideration for macitentan as outpatient. PFTs 10/05/15  FEV1 0.91 (40%) FVC 1.64 (55%), DLCO 8.80 (38%). Auto-immune and infectious serologies negative. VQ negative. Discharge weight 148 pounds.   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 Has been drained twice. Underwent first thoracentesis on 11/7.  Transudative. CT scan 07/24/17 showed large R effusion and small pericardial effusion. +COPD.  Underwent repeat thoracentesis on 07/29/17 with 310cc out.  Cytology negative. F/u CXR 11/21 showed small bilateral effusions.  RF markedly ++ but other serology negative. Saw Dr. Amil Amen who said she did not have RA but was diagnosed with polyarthralgia/OA.   Admitted 6/21 with respiratory failure. Echo EF 55-60% with mild RV HK with large peridardial effusion. Suspicion for auto-immune flare. ESR 63. Auto-immune panel again negative. Treated with steroids with prompt improvement and regression of pericardial effusion. Had PAF Went to SNF.  Readmitted 9/21 with Hgb 3.1. Underwent EGD and colonoscopy , concern for esophageal candidiasis, multiple polyps removed from the colon and colonic AVMs noted which is suspected to be the cause of ongoing bleeding in the setting of Eliquis. Eliquis stopped. Transfused a total of 4 units of PRBC this admission and given IV iron.  Admitted 8/22 with a/c respiratory failure with hypoxia  suspected to be due to combination of ? COPD exacerbation and a/c CHF.  She was started on IV lasix, DuoNebs and prednisone. AHF team consulted to assist with management.  Repeat echo showed EF 60-65%, RV ok. Hospitalization complicated by multifocal PNA and AKI w/ hyperkalemia. Arlyce Harman and diuretics were initially held but later resumed. She was discharged on 4L oxygen (baseline) with HH PT/OT, weight 141 lbs.  Admitted 9/23 with a/c respiratory failure with hypoxia 2/2 a/c dHF with RV failure and PAH. Diuresed with IV lasix. Echo showed EF 70-75%, moderate LVH, grade II DD, RV okay. Patient refused RHC. GDMT titrated but limited by low BP. She was discharged home, weight 134 lbs.  Today she returns for post hospital HF follow up with her daughter and husband. Walking very little at the house. Walked from her chair to the sink once with PT. Overall feeling weak and fatigued. Remains on 4L oxgygen, SOB with minimal activity. Has some LE swelling. Denies palpitations, CP, dizziness, or PND/Orthopnea. Appetite ok. No fever or chills. Weight at home 132 pounds. Taking all medications. Daughter says Neva's anxiety is worse.   Cardiac Studies: Echo (9/23): EF 70-75%, moderate LVH, grade II DD, RV okay Echo (8/22): EF 60-65%, RV okay Echo (4/21): EF 70-75% RV normal . No effusion. Personally reviewed Echo (4/19): EF 65% RV normal. No effusion Personally reviewed Echo (5/18): EF 60-65% RV completely normal. No TR to measure RVSP. No effusion  Echo (3/17): EF 60-65% septum still flattened. RV size improved moderate HK. RVSP ~80. IVC small. Pericardial effusion essentially resolved   CXR 09/28/17: Small bilateral effusions  - PAH regimen: 1. Adcirca 40 2. Letairis  10 (Switched to letaitris as it was thought that sinusitis might be due to macitentan.)  6MW 8/17: 840 ft (256 M) on 2 L O2, sats ranged 83-95%, HR ranged 96-111. 6MW 9/19: 780 ft =237.755mters On 3L of O2 Starting O2 94% Ending O2-85%   Starting heart rate-80 Ending heart rate- 110  - RHC 12/18: RA = 5 RV = 56/9 PA = 52/17 (33) PCW = 10 Fick cardiac output/index = 7.5/4.5 PVR = 3.5 WU Ao sat = 96% PA sat = 73%, 72% High SVC sat = 76%  - VQ scan low prob. CT chest mild COPD. Moderate pericardial effusion. 3v CAD. + cirrhosis. Hepatitis serologies negative.   PFTs (7/15) (FEV1 1.23 (52%) with FVC 2.01 (65%) and DLCO 51% PFT's (1/17)  FEV1 0.91 (40 %) ratio 56% DLCO 38 % corrects to 63 % for alv volume  PFTs (3/17) FEV1 1.0 (44%) FVC 1.74 (58%0 DLCO 28%  - R/LHC 10/08/15 with normal coronaries, normal CO, and sever PAH with 13.6 WU.  Findings: Ao = 108/68 (86) LV = 102/10/11 RA = 9 RV = 97/8/12 PA = 104/49 (72) PCW = 18 Fick cardiac output/index = 3.96/2.34 PVR = 13.6 WU FA sat = 93% PA sat = 66%, 69%  - RHC 5/18 RA = 2 RV = 48/4 PA = 45/17 (30) PCW = 8 Fick cardiac output/index = 4.9/3.0 PVR = 4.4 WU Ao sat = 94% PA sat = 68%, 70%  Review of systems complete and found to be negative unless listed in HPI.   SH:  Social History   Socioeconomic History   Marital status: Married    Spouse name: Not on file   Number of children: Not on file   Years of education: 2   Highest education level: Not on file  Occupational History   Occupation: retired tPharmacist, hospital Tobacco Use   Smoking status: Former    Packs/day: 1.00    Years: 40.00    Total pack years: 40.00    Types: Cigarettes    Quit date: 02/22/2014    Years since quitting: 8.3   Smokeless tobacco: Never  Vaping Use   Vaping Use: Never used  Substance and Sexual Activity   Alcohol use: No   Drug use: No   Sexual activity: Never  Other Topics Concern   Not on file  Social History Narrative   Married with 2 children.  Independent of ADLs.      Does not have a living will.   Would desire CPR but would not want prolonged life support if futile- husband aware.   Social Determinants of Health   Financial Resource Strain: Low Risk   (04/11/2022)   Overall Financial Resource Strain (CARDIA)    Difficulty of Paying Living Expenses: Not hard at all  Food Insecurity: No Food Insecurity (04/11/2022)   Hunger Vital Sign    Worried About Running Out of Food in the Last Year: Never true    Ran Out of Food in the Last Year: Never true  Transportation Needs: No Transportation Needs (04/11/2022)   PRAPARE - THydrologist(Medical): No    Lack of Transportation (Non-Medical): No  Physical Activity: Insufficiently Active (04/11/2022)   Exercise Vital Sign    Days of Exercise per Week: 5 days    Minutes of Exercise per Session: 20 min  Stress: No Stress Concern Present (04/11/2022)   FSt. John the Baptist   Feeling of  Stress : Not at all  Social Connections: Not on file  Intimate Partner Violence: Not on file   FH:  Family History  Problem Relation Age of Onset   Glaucoma Brother    Vascular Disease Father        Died of complications from surgery   Heart attack Father    Asthma Mother        Died of asthma attack   Dementia Mother    Hyperlipidemia Mother    Hypertension Mother    Past Medical History:  Diagnosis Date   Acute respiratory failure with hypoxia and hypercapnea 02/26/2014   Allergic rhinitis due to pollen    Allergy    Anemia 06/12/2020   Arthritis    Asthma    CHF (congestive heart failure) (HCC)    COPD (chronic obstructive pulmonary disease) ([place])    Diastolic dysfunction    a. 02/2014 Echo: EF 65-70%, Gr 1 DD, RVH;  b. 09/2015 Echo: EF 55%, Gr 1 DD.   Emphysema of lung (HCC)    H/O seasonal allergies    History of chicken pox    History of tobacco abuse    a. Quit 2015.   History of UTI    Hypertension    Hypertensive heart disease    Lower extremity edema    Pericardial effusion    a. 09/2015 Echo: Mod eff w/o tamponade.   Right heart failure with reduced right ventricular function ([place])    a. 09/2015 Echo: EF 55%,  Gr 1 DD, D shaped IV septum, sev dil RV with mod reduced fxn, mod dil RA, RV-RA grad 49mHg, PASP 813mg, mod pericard eff w/o tamponade.   Severe Pulmonary Hypertension    a. 09/2015 Echo: PASP 8758m.   Current Outpatient Medications  Medication Sig Dispense Refill   acetaminophen (TYLENOL) 325 MG tablet Take 325 mg by mouth as needed for mild pain.     albuterol (VENTOLIN HFA) 108 (90 Base) MCG/ACT inhaler INHALE 2 PUFFS INTO THE LUNGS EVERY 6 HOURS AS NEEDED FOR WHEEZING/SHORTNESS OF BREATH (Patient taking differently: Inhale 1 puff into the lungs in the morning, at noon, in the evening, and at bedtime.) 36 each 1   ambrisentan (LETAIRIS) 10 MG tablet TAKE 1 TABLET BY MOUTH 1 TIME A DAY. DO NOT HANDLE IF PREGNANT.Please make appointment for further refills 30 tablet 3   aspirin EC 81 MG EC tablet Take 1 tablet (81 mg total) by mouth daily. 30 tablet 0   ferrous sulfate 325 (65 FE) MG EC tablet TAKE 1 TABLET BY MOUTH TWICE A DAY WITH MEALS 180 tablet 1   fluticasone (FLONASE) 50 MCG/ACT nasal spray Place 1 spray into both nostrils daily. 18.2 mL 2   furosemide (LASIX) 40 MG tablet TAKE 1 TABLET BY MOUTH TWICE A DAY 180 tablet 3   hydrOXYzine (ATARAX) 50 MG tablet TAKE 1/2 TO 1 TABLET BY MOUTH AT BEDTIME FOR ANXIETY 90 tablet 1   levothyroxine (SYNTHROID) 75 MCG tablet TAKE 1 TABLET BY MOUTH EVERY DAY 90 tablet 0   loratadine (CLARITIN) 10 MG tablet TAKE 1 TABLET BY MOUTH EVERY DAY 90 tablet 1   midodrine (PROAMATINE) 2.5 MG tablet Take 1 tablet (2.5 mg total) by mouth 3 (three) times daily with meals. 90 tablet 3   OXYGEN Inhale 4 L into the lungs continuous.     polyvinyl alcohol (LIQUIFILM TEARS) 1.4 % ophthalmic solution Place 1 drop into both eyes daily as needed for dry eyes.  potassium chloride SA (KLOR-CON M20) 20 MEQ tablet TAKE 1 TABLET BY MOUTH EVERY DAY 90 tablet 0   spironolactone (ALDACTONE) 25 MG tablet TAKE 1/2 TABLET BY MOUTH EVERY DAY 45 tablet 3   tadalafil, PAH, (ADCIRCA)  20 MG tablet TAKE 2 TABLETS BY MOUTH 1 TIME A DAY. 60 tablet 11   No current facility-administered medications for this encounter.   BP (!) 97/36   Pulse 72   Wt 61.1 kg (134 lb 9.6 oz)   SpO2 94%   BMI 24.62 kg/m   Wt Readings from Last 3 Encounters:  06/19/22 61.1 kg (134 lb 9.6 oz)  06/07/22 60.8 kg (134 lb)  04/11/22 61.2 kg (135 lb)   PHYSICAL EXAM: General:  NAD. No resp difficulty, arrived in [facility] on oxygen, frail. HEENT: Normal Neck: Supple. No JVD. Carotids 2+ bilat; no bruits. No lymphadenopathy or thryomegaly appreciated. Cor: PMI nondisplaced. Regular rate & rhythm. No rubs, gallops, 2/6 TR Lungs: Diminished throughout Abdomen: Soft, nontender, nondistended. No hepatosplenomegaly. No bruits or masses. Good bowel sounds. Extremities: No cyanosis, clubbing, rash, 1+ pre-tibial BLE edema; +psoriatic plaques on BLE Neuro: Alert & oriented x 3, cranial nerves grossly intact. Moves all 4 extremities w/o difficulty. Affect pleasant.  ECG (personally reviewed): NSR 1AVB, 83 bpm  ASSESSMENT & PLAN: 1. Chronic diastolic HF with RV failure: - Repeat RHC 12/18 with mild PAH (mean PA 33) - Echo 5/18: with complete resolution of RV strain. No significant TR to estimate RVSP - Echo 4/19: with EF 65% no evidence of RV strain  - Echo 4/21: EF 70-75% RV normal  - Echo 11/21: EF 75% RV normal no evidence PAH - Echo 8/27: EF 60-65%, RV normal inadequate TR signal to assess PA pressure - Likely undiagnosed CTD though auto-immune panel previously negative. Does have psoriasis. Has seen Rheum  - Echo this admit (9/23): EF 70-75%, RV okay, inadequate TR signal to assess PA pressure - She refused RHC during last admission. - NYHA III-IIIb, functional status limited by physical deconditioning. Volume OK today. - Start midodrine 2.5 mg tid. - Elevate legs and wear compression hose. - Continue spiro 12.5 mg daily, would like to keep on board if BP will tolerate. - Continue Lasix 40 mg  bid. - No BP room for ARB/ARNi  - Labs today.    2. PAH - Suspect combination of WHO Group I and III PAH - On Letairis and Tadalafil.  - Echo as above.  3.  COPD-Gold III - On 4L O2 at home. - On inhalers. - Follows with Pulmonary.  4. Chronic Respiratory failure - Multifactorial. Continue supplemental O2.  - Follows with Dr. Melvyn Novas.  - No change.   5.  PAF - Eliquis stopped due to severe LGIB 9/21 (hgb 3.1). Will not restart with AVMs. - Amio stopped previously. - Remains in NSR on ECG today.   6. Cirrhosis - Due to RHF. Hepatitis serologies negative.  - Given previous EF > 75%, may need to consider propanolol to avoid high-output HF at some point. - Add midodrine as above.  7. Left upper lobe density - 1.7 X 0.8 cm density noted medial left upper lobe on CTA chest - Felt to represent ATX vs PNA vs neoplasm - Will need repeat imaging in 3 months   8. Physical Deconditioning - Continue PT/OT. - This is a major barrier for her.  9. Anxiety - She has hydroxyzine at home. Advised trying 1/2 tablet during the day to see if helps with daytime  anxiety. Discussed it can cause sedation, fall precautions discussed as well. - Needs PCP follow up to further address.  Follow up in 6 months with Dr. Haroldine Laws.    Rafael Bihari, FNP  8:05 AM

## 2022-06-20 ENCOUNTER — Telehealth: Payer: Self-pay | Admitting: *Deleted

## 2022-06-20 ENCOUNTER — Encounter (HOSPITAL_COMMUNITY): Payer: Self-pay

## 2022-06-20 NOTE — Addendum Note (Signed)
Encounter addended by: Rafael Bihari, FNP on: 06/20/2022 8:08 AM  Actions taken: Clinical Note Signed

## 2022-06-20 NOTE — Telephone Encounter (Signed)
Patient's husband called stating that his wife was in the hospital and is being followed by cardiology. Patient's husband stated that they were told that she needs to follow-up with Dr. Diona Browner.  Patient's husband stated that he is unable to get her into the office without a lot of difficulty. Patient's husband wants to know if they can do a virtual visit or a phone visit. Patient's husband wants to know when the appointment needs to be scheduled.

## 2022-06-20 NOTE — Telephone Encounter (Signed)
OK to do a virtual visit.

## 2022-06-22 NOTE — Telephone Encounter (Signed)
Please call and schedule Virtual Hosptial Follow up Appointment with Dr. Diona Browner sometime this week.

## 2022-06-23 ENCOUNTER — Telehealth (HOSPITAL_COMMUNITY): Payer: Self-pay

## 2022-06-23 ENCOUNTER — Encounter: Payer: Self-pay | Admitting: Family Medicine

## 2022-06-23 DIAGNOSIS — I5032 Chronic diastolic (congestive) heart failure: Secondary | ICD-10-CM

## 2022-06-23 NOTE — Telephone Encounter (Signed)
Spoke with patient husband will call back and schedule appointment when they find out when daughter can help with appointment.

## 2022-06-23 NOTE — Telephone Encounter (Addendum)
Spoke with [name] aware of changes, states that she may not take them,   Labs scheduled   ----- Message from Linda Bihari, FNP sent at 06/20/2022  5:04 PM EDT ----- Labs stable. BNP remains elevated.   Increase Lasix to 60 mg q am and 40 mg q pm.  Repeat BMET and BNP in 10 days.

## 2022-06-24 ENCOUNTER — Other Ambulatory Visit (HOSPITAL_COMMUNITY): Payer: Self-pay

## 2022-06-24 ENCOUNTER — Telehealth (HOSPITAL_COMMUNITY): Payer: Self-pay

## 2022-06-24 MED ORDER — FUROSEMIDE 20 MG PO TABS: ORAL_TABLET | ORAL | 5 refills | Status: DC

## 2022-06-24 NOTE — Telephone Encounter (Signed)
error 

## 2022-06-24 NOTE — Telephone Encounter (Signed)
Home health called to report that the patients blood pressure was 81/47,101/43. Patient took lasix 139m this morning instead of 650m I spoke with JeGinnie Smarthe said for Linda Stevens to hold the lasix and the spironolactone for 2 days and to send in the 2022mablets of lasix. Home health nurse advised      CB# [phone]

## 2022-06-30 ENCOUNTER — Telehealth (HOSPITAL_COMMUNITY): Payer: Self-pay

## 2022-06-30 ENCOUNTER — Ambulatory Visit (HOSPITAL_COMMUNITY)
Admission: RE | Admit: 2022-06-30 | Discharge: 2022-06-30 | Disposition: A | Discharge status: Home / Self Care | Payer: Medicare HMO | Source: Ambulatory Visit | Attending: Internal Medicine | Admitting: Internal Medicine

## 2022-06-30 DIAGNOSIS — I5032 Chronic diastolic (congestive) heart failure: Secondary | ICD-10-CM | POA: Insufficient documentation

## 2022-06-30 DIAGNOSIS — R0902 Hypoxemia: Secondary | ICD-10-CM | POA: Diagnosis not present

## 2022-06-30 DIAGNOSIS — I130 Hypertensive heart and chronic kidney disease with heart failure and stage 1 through stage 4 chronic kidney disease, or unspecified chronic kidney disease: Secondary | ICD-10-CM | POA: Diagnosis not present

## 2022-06-30 LAB — BASIC METABOLIC PANEL
Anion gap: 12 (ref 5–15)
BUN: 29 mg/dL — ABNORMAL HIGH (ref 8–23)
CO2: 37 mmol/L — ABNORMAL HIGH (ref 22–32)
Calcium: 9 mg/dL (ref 8.9–10.3)
Chloride: 95 mmol/L — ABNORMAL LOW (ref 98–111)
Creatinine, Ser: 1.58 mg/dL — ABNORMAL HIGH (ref 0.44–1.00)
GFR, Estimated: 34 mL/min — ABNORMAL LOW (ref >60)
Glucose, Bld: 139 mg/dL — ABNORMAL HIGH (ref 70–99)
Potassium: 4.2 mmol/L (ref 3.5–5.1)
Sodium: 144 mmol/L (ref 135–145)

## 2022-06-30 LAB — BRAIN NATRIURETIC PEPTIDE: B Natriuretic Peptide: 1528.5 pg/mL — ABNORMAL HIGH (ref 0.0–100.0)

## 2022-06-30 NOTE — Telephone Encounter (Addendum)
Patient arrived for lab appointment. Requested to be seen by staff member. Patient was assessed and the following was found.  BP- 115/80, HR-79,  SAT- 86-90 on 5l of oxygen.  She stated her legs were swollen, legs were assessed she was wearing compression stockings and there was a trace of edema. Daughter stated her legs looked better since putting on stockings. She is taking her medications as prescribed. Patient is also anxious and feels nauseous from time to time. Advised daughter she could have a mint, ginger chews or ginger ale to help with nausea. Told her to concentrate on breathing, breathing in through the nose and out through her mouth. Told family we will call with lab results and if we need to make any changes we will tell them at that time

## 2022-07-01 ENCOUNTER — Telehealth (HOSPITAL_COMMUNITY): Payer: Self-pay

## 2022-07-01 MED ORDER — TORSEMIDE 20 MG PO TABS: 40.0000 mg | ORAL_TABLET | Freq: Two times a day (BID) | ORAL | 3 refills | Status: AC

## 2022-07-01 NOTE — Telephone Encounter (Addendum)
Spoke with daughter [name], aware of medication changes and follow up appointment scheduled   ----- Message from Rafael Bihari, [place] sent at 06/30/2022  3:20 PM EDT ----- BNP worse. With on-going symptoms, stop Lasix.  Start torsemide 40 bid. Please see if we can get her in with APP in next 1-2 weeks to follow up on her volume.

## 2022-07-02 ENCOUNTER — Other Ambulatory Visit: Payer: Self-pay

## 2022-07-02 ENCOUNTER — Inpatient Hospital Stay (HOSPITAL_COMMUNITY)
Admission: EM | Admit: 2022-07-02 | Discharge: 2022-07-09 | DRG: 291 | Disposition: E | Payer: Medicare HMO | Attending: Internal Medicine | Admitting: Internal Medicine

## 2022-07-02 ENCOUNTER — Emergency Department (HOSPITAL_COMMUNITY): Payer: Medicare HMO

## 2022-07-02 ENCOUNTER — Encounter (HOSPITAL_COMMUNITY): Payer: Self-pay | Admitting: Radiology

## 2022-07-02 ENCOUNTER — Telehealth (HOSPITAL_COMMUNITY): Payer: Self-pay | Admitting: *Deleted

## 2022-07-02 DIAGNOSIS — J9622 Acute and chronic respiratory failure with hypercapnia: Secondary | ICD-10-CM | POA: Diagnosis present

## 2022-07-02 DIAGNOSIS — I130 Hypertensive heart and chronic kidney disease with heart failure and stage 1 through stage 4 chronic kidney disease, or unspecified chronic kidney disease: Principal | ICD-10-CM | POA: Diagnosis present

## 2022-07-02 DIAGNOSIS — J439 Emphysema, unspecified: Secondary | ICD-10-CM | POA: Diagnosis present

## 2022-07-02 DIAGNOSIS — E039 Hypothyroidism, unspecified: Secondary | ICD-10-CM | POA: Diagnosis not present

## 2022-07-02 DIAGNOSIS — R0902 Hypoxemia: Secondary | ICD-10-CM | POA: Diagnosis not present

## 2022-07-02 DIAGNOSIS — Z825 Family history of asthma and other chronic lower respiratory diseases: Secondary | ICD-10-CM

## 2022-07-02 DIAGNOSIS — R680 Hypothermia, not associated with low environmental temperature: Secondary | ICD-10-CM | POA: Diagnosis present

## 2022-07-02 DIAGNOSIS — M349 Systemic sclerosis, unspecified: Secondary | ICD-10-CM | POA: Diagnosis not present

## 2022-07-02 DIAGNOSIS — E8729 Other acidosis: Secondary | ICD-10-CM | POA: Diagnosis not present

## 2022-07-02 DIAGNOSIS — I509 Heart failure, unspecified: Secondary | ICD-10-CM | POA: Diagnosis not present

## 2022-07-02 DIAGNOSIS — Z87891 Personal history of nicotine dependence: Secondary | ICD-10-CM

## 2022-07-02 DIAGNOSIS — G9340 Encephalopathy, unspecified: Secondary | ICD-10-CM | POA: Diagnosis not present

## 2022-07-02 DIAGNOSIS — J90 Pleural effusion, not elsewhere classified: Secondary | ICD-10-CM | POA: Diagnosis present

## 2022-07-02 DIAGNOSIS — Z9981 Dependence on supplemental oxygen: Secondary | ICD-10-CM

## 2022-07-02 DIAGNOSIS — Z7982 Long term (current) use of aspirin: Secondary | ICD-10-CM

## 2022-07-02 DIAGNOSIS — I5043 Acute on chronic combined systolic (congestive) and diastolic (congestive) heart failure: Secondary | ICD-10-CM | POA: Diagnosis not present

## 2022-07-02 DIAGNOSIS — K5520 Angiodysplasia of colon without hemorrhage: Secondary | ICD-10-CM | POA: Diagnosis present

## 2022-07-02 DIAGNOSIS — R0602 Shortness of breath: Secondary | ICD-10-CM | POA: Diagnosis not present

## 2022-07-02 DIAGNOSIS — Z888 Allergy status to other drugs, medicaments and biological substances status: Secondary | ICD-10-CM

## 2022-07-02 DIAGNOSIS — K7460 Unspecified cirrhosis of liver: Secondary | ICD-10-CM | POA: Diagnosis not present

## 2022-07-02 DIAGNOSIS — D649 Anemia, unspecified: Secondary | ICD-10-CM | POA: Diagnosis present

## 2022-07-02 DIAGNOSIS — I2721 Secondary pulmonary arterial hypertension: Secondary | ICD-10-CM | POA: Diagnosis present

## 2022-07-02 DIAGNOSIS — Z79899 Other long term (current) drug therapy: Secondary | ICD-10-CM

## 2022-07-02 DIAGNOSIS — I5033 Acute on chronic diastolic (congestive) heart failure: Secondary | ICD-10-CM | POA: Diagnosis not present

## 2022-07-02 DIAGNOSIS — D631 Anemia in chronic kidney disease: Secondary | ICD-10-CM | POA: Diagnosis not present

## 2022-07-02 DIAGNOSIS — Z66 Do not resuscitate: Secondary | ICD-10-CM | POA: Diagnosis present

## 2022-07-02 DIAGNOSIS — Z743 Need for continuous supervision: Secondary | ICD-10-CM | POA: Diagnosis not present

## 2022-07-02 DIAGNOSIS — Z880 Allergy status to penicillin: Secondary | ICD-10-CM

## 2022-07-02 DIAGNOSIS — I5082 Biventricular heart failure: Secondary | ICD-10-CM | POA: Diagnosis not present

## 2022-07-02 DIAGNOSIS — N1832 Chronic kidney disease, stage 3b: Secondary | ICD-10-CM | POA: Diagnosis not present

## 2022-07-02 DIAGNOSIS — Z1152 Encounter for screening for COVID-19: Secondary | ICD-10-CM

## 2022-07-02 DIAGNOSIS — I3139 Other pericardial effusion (noninflammatory): Secondary | ICD-10-CM | POA: Diagnosis present

## 2022-07-02 DIAGNOSIS — Z8249 Family history of ischemic heart disease and other diseases of the circulatory system: Secondary | ICD-10-CM

## 2022-07-02 DIAGNOSIS — E875 Hyperkalemia: Secondary | ICD-10-CM | POA: Diagnosis present

## 2022-07-02 DIAGNOSIS — Z83438 Family history of other disorder of lipoprotein metabolism and other lipidemia: Secondary | ICD-10-CM

## 2022-07-02 DIAGNOSIS — Z881 Allergy status to other antibiotic agents status: Secondary | ICD-10-CM

## 2022-07-02 DIAGNOSIS — J9621 Acute and chronic respiratory failure with hypoxia: Secondary | ICD-10-CM | POA: Diagnosis not present

## 2022-07-02 DIAGNOSIS — I959 Hypotension, unspecified: Secondary | ICD-10-CM | POA: Diagnosis present

## 2022-07-02 DIAGNOSIS — I110 Hypertensive heart disease with heart failure: Secondary | ICD-10-CM | POA: Diagnosis not present

## 2022-07-02 DIAGNOSIS — I480 Paroxysmal atrial fibrillation: Secondary | ICD-10-CM | POA: Diagnosis present

## 2022-07-02 DIAGNOSIS — Z515 Encounter for palliative care: Secondary | ICD-10-CM | POA: Diagnosis not present

## 2022-07-02 DIAGNOSIS — J449 Chronic obstructive pulmonary disease, unspecified: Secondary | ICD-10-CM | POA: Diagnosis present

## 2022-07-02 DIAGNOSIS — Z7189 Other specified counseling: Secondary | ICD-10-CM | POA: Diagnosis not present

## 2022-07-02 DIAGNOSIS — Z79890 Hormone replacement therapy (postmenopausal): Secondary | ICD-10-CM

## 2022-07-02 LAB — CBC WITH DIFFERENTIAL/PLATELET
Abs Immature Granulocytes: 0.01 10*3/uL (ref 0.00–0.07)
Basophils Absolute: 0 10*3/uL (ref 0.0–0.1)
Basophils Relative: 1 %
Eosinophils Absolute: 0.3 10*3/uL (ref 0.0–0.5)
Eosinophils Relative: 9 %
HCT: 30.4 % — ABNORMAL LOW (ref 36.0–46.0)
Hemoglobin: 9.5 g/dL — ABNORMAL LOW (ref 12.0–15.0)
Immature Granulocytes: 0 %
Lymphocytes Relative: 14 %
Lymphs Abs: 0.5 10*3/uL — ABNORMAL LOW (ref 0.7–4.0)
MCH: 33.2 pg (ref 26.0–34.0)
MCHC: 31.3 g/dL (ref 30.0–36.0)
MCV: 106.3 fL — ABNORMAL HIGH (ref 80.0–100.0)
Monocytes Absolute: 0.5 10*3/uL (ref 0.1–1.0)
Monocytes Relative: 12 %
Neutro Abs: 2.4 10*3/uL (ref 1.7–7.7)
Neutrophils Relative %: 64 %
Platelets: 134 10*3/uL — ABNORMAL LOW (ref 150–400)
RBC: 2.86 MIL/uL — ABNORMAL LOW (ref 3.87–5.11)
RDW: 19.3 % — ABNORMAL HIGH (ref 11.5–15.5)
WBC: 3.8 10*3/uL — ABNORMAL LOW (ref 4.0–10.5)
nRBC: 0 % (ref 0.0–0.2)

## 2022-07-02 LAB — I-STAT VENOUS BLOOD GAS, ED
Acid-Base Excess: 18 mmol/L — ABNORMAL HIGH (ref 0.0–2.0)
Bicarbonate: 45.9 mmol/L — ABNORMAL HIGH (ref 20.0–28.0)
Calcium, Ion: 1.12 mmol/L — ABNORMAL LOW (ref 1.15–1.40)
HCT: 29 % — ABNORMAL LOW (ref 36.0–46.0)
Hemoglobin: 9.9 g/dL — ABNORMAL LOW (ref 12.0–15.0)
O2 Saturation: 93 %
Potassium: 4.7 mmol/L (ref 3.5–5.1)
Sodium: 141 mmol/L (ref 135–145)
TCO2: 48 mmol/L — ABNORMAL HIGH (ref 22–32)
pCO2, Ven: 74.5 mmHg (ref 44–60)
pH, Ven: 7.398 (ref 7.25–7.43)
pO2, Ven: 73 mmHg — ABNORMAL HIGH (ref 32–45)

## 2022-07-02 LAB — COMPREHENSIVE METABOLIC PANEL
ALT: 16 U/L (ref 0–44)
AST: 25 U/L (ref 15–41)
Albumin: 3.3 g/dL — ABNORMAL LOW (ref 3.5–5.0)
Alkaline Phosphatase: 97 U/L (ref 38–126)
Anion gap: 13 (ref 5–15)
BUN: 32 mg/dL — ABNORMAL HIGH (ref 8–23)
CO2: 39 mmol/L — ABNORMAL HIGH (ref 22–32)
Calcium: 9.1 mg/dL (ref 8.9–10.3)
Chloride: 92 mmol/L — ABNORMAL LOW (ref 98–111)
Creatinine, Ser: 1.67 mg/dL — ABNORMAL HIGH (ref 0.44–1.00)
GFR, Estimated: 32 mL/min — ABNORMAL LOW (ref >60)
Glucose, Bld: 94 mg/dL (ref 70–99)
Potassium: 4.7 mmol/L (ref 3.5–5.1)
Sodium: 144 mmol/L (ref 135–145)
Total Bilirubin: 0.6 mg/dL (ref 0.3–1.2)
Total Protein: 6.1 g/dL — ABNORMAL LOW (ref 6.5–8.1)

## 2022-07-02 LAB — BRAIN NATRIURETIC PEPTIDE: B Natriuretic Peptide: 1740.4 pg/mL — ABNORMAL HIGH (ref 0.0–100.0)

## 2022-07-02 LAB — TROPONIN I (HIGH SENSITIVITY)
Troponin I (High Sensitivity): 14 ng/L (ref <18)
Troponin I (High Sensitivity): 11 ng/L (ref <18)

## 2022-07-02 LAB — SARS CORONAVIRUS 2 BY RT PCR: SARS Coronavirus 2 by RT PCR: NEGATIVE

## 2022-07-02 MED ORDER — ONDANSETRON HCL 4 MG PO TABS: 4.0000 mg | ORAL_TABLET | Freq: Four times a day (QID) | ORAL | Status: DC | PRN

## 2022-07-02 MED ORDER — SODIUM CHLORIDE 0.9% FLUSH
3.0000 mL | Freq: Two times a day (BID) | INTRAVENOUS | Status: DC
  Administered 2022-07-02: 3 mL via INTRAVENOUS

## 2022-07-02 MED ORDER — POTASSIUM CHLORIDE CRYS ER 20 MEQ PO TBCR: 20.0000 meq | EXTENDED_RELEASE_TABLET | Freq: Every day | ORAL | Status: DC

## 2022-07-02 MED ORDER — TADALAFIL 20 MG PO TABS
40.0000 mg | ORAL_TABLET | Freq: Every day | ORAL | Status: DC
  Filled 2022-07-02: qty 2

## 2022-07-02 MED ORDER — HEPARIN SODIUM (PORCINE) 5000 UNIT/ML IJ SOLN
5000.0000 [IU] | Freq: Three times a day (TID) | INTRAMUSCULAR | Status: DC
  Administered 2022-07-02 – 2022-07-03 (×2): 5000 [IU] via SUBCUTANEOUS
  Filled 2022-07-02 (×2): qty 1

## 2022-07-02 MED ORDER — SPIRONOLACTONE 12.5 MG HALF TABLET: 12.5000 mg | ORAL_TABLET | Freq: Every day | ORAL | Status: DC

## 2022-07-02 MED ORDER — AMBRISENTAN 5 MG PO TABS
10.0000 mg | ORAL_TABLET | Freq: Every day | ORAL | Status: DC
  Filled 2022-07-02: qty 2

## 2022-07-02 MED ORDER — ASPIRIN 81 MG PO TBEC: 81.0000 mg | DELAYED_RELEASE_TABLET | Freq: Every day | ORAL | Status: DC

## 2022-07-02 MED ORDER — ACETAMINOPHEN 650 MG RE SUPP: 650.0000 mg | Freq: Four times a day (QID) | RECTAL | Status: DC | PRN

## 2022-07-02 MED ORDER — ONDANSETRON HCL 4 MG/2ML IJ SOLN
4.0000 mg | Freq: Four times a day (QID) | INTRAMUSCULAR | Status: DC | PRN
  Administered 2022-07-03: 4 mg via INTRAVENOUS
  Filled 2022-07-02: qty 2

## 2022-07-02 MED ORDER — FERROUS SULFATE 325 (65 FE) MG PO TABS: 325.0000 mg | ORAL_TABLET | Freq: Two times a day (BID) | ORAL | Status: DC

## 2022-07-02 MED ORDER — FUROSEMIDE 10 MG/ML IJ SOLN
40.0000 mg | Freq: Two times a day (BID) | INTRAMUSCULAR | Status: DC
  Administered 2022-07-03: 40 mg via INTRAVENOUS
  Filled 2022-07-02: qty 4

## 2022-07-02 MED ORDER — ACETAMINOPHEN 325 MG PO TABS: 650.0000 mg | ORAL_TABLET | Freq: Four times a day (QID) | ORAL | Status: DC | PRN

## 2022-07-02 MED ORDER — LEVOTHYROXINE SODIUM 75 MCG PO TABS
75.0000 ug | ORAL_TABLET | Freq: Every day | ORAL | Status: DC
  Filled 2022-07-02: qty 1

## 2022-07-02 MED ORDER — ALBUTEROL SULFATE (2.5 MG/3ML) 0.083% IN NEBU
2.5000 mg | INHALATION_SOLUTION | Freq: Four times a day (QID) | RESPIRATORY_TRACT | Status: DC | PRN
  Administered 2022-07-03: 2.5 mg via RESPIRATORY_TRACT
  Filled 2022-07-02: qty 3

## 2022-07-02 MED ORDER — SENNOSIDES-DOCUSATE SODIUM 8.6-50 MG PO TABS: 1.0000 | ORAL_TABLET | Freq: Every evening | ORAL | Status: DC | PRN

## 2022-07-02 MED ORDER — FUROSEMIDE 10 MG/ML IJ SOLN
40.0000 mg | Freq: Once | INTRAMUSCULAR | Status: AC
  Administered 2022-07-02: 40 mg via INTRAVENOUS
  Filled 2022-07-02: qty 4

## 2022-07-02 MED ORDER — MIDODRINE HCL 5 MG PO TABS: 2.5000 mg | ORAL_TABLET | Freq: Three times a day (TID) | ORAL | Status: DC

## 2022-07-02 MED ORDER — HYDROXYZINE HCL 25 MG PO TABS: 25.0000 mg | ORAL_TABLET | Freq: Every day | ORAL | Status: DC

## 2022-07-02 NOTE — ED Notes (Signed)
Received verbal report from Avanell Shackleton RN at this time

## 2022-07-02 NOTE — Progress Notes (Signed)
Pharmacy Consult for Pulmonary Hypertension Treatment   Indication - Continuation of prior to admission medication   Patient is 76 y.o.  with history of PAH on chronic Ambrisentan (Letairis) PTA and will be continued while hospitalized.   Continuing this medication order as an inpatient requires that monitoring parameters per REMS requirements must be met.  Chronic therapy is under the supervision of Bensimhon, Shaune Pascal, MD who is enrolled in the REMS program and is being notified of continuation of therapy. A staff message in EPIC has been sent notifying the certified prescriber.  Per patient report has previously been educated on Pregnancy Risk and Hepatotoxicity . On admission pregnancy risk has been assessed and no monitoring required. Hepatic function has been evaluated. AST/ALT appropriate to continue medication at this time.     Latest Ref Rng & Units 06/11/2022    4:17 PM 06/03/2022    9:39 AM 06/02/2022    2:11 PM  Hepatic Function  Total Protein 6.5 - 8.1 g/dL 6.1  6.4  6.6   Albumin 3.5 - 5.0 g/dL 3.3  3.2  3.6   AST 15 - 41 U/L 25  37  18   ALT 0 - 44 U/L _0 Alk Phosphatase 38 - 126 U/L 97  71  85   Total Bilirubin 0.3 - 1.2 mg/dL 0.6  1.1  0.6   Bilirubin, Direct 0.0 - 0.2 mg/dL  0.3      If any question arise or pregnancy is identified during hospitalization, contact for bosentan: [phone]; macitentan: [phone]; ambrisentan: [phone].  Thank for you allowing [place] to participate in the care of this patient.

## 2022-07-02 NOTE — ED Triage Notes (Signed)
Pt husband called out with complaints of pt being short of breath. Pt normally on 4L O2 but was only 87% when EMS arrived. Pt placed on simple face mask at 10L to get to 95%. Pt with a history of heart failure with a recent medication change from Lasix to Torsemide.

## 2022-07-02 NOTE — Hospital Course (Signed)
Linda Stevens is a 76 y.o. female with medical history significant for COPD, chronic respiratory failure with hypoxia and hypercarbia on 4-5 L O2 via [place] at home, chronic diastolic CHF, pulmonary HTN with RV failure, PAF not on anticoagulation due to history of recurrent GI bleed/AVMs, hepatic cirrhosis, chronic anemia, CKD stage IIIb who is admitted with acute on chronic hypoxic respiratory failure due to acute on chronic diastolic CHF exacerbation.

## 2022-07-02 NOTE — Assessment & Plan Note (Addendum)
Secondary to chronic disease and chronic GI bleeding/AVMs.  Hemoglobin stable at 9.5.

## 2022-07-02 NOTE — Assessment & Plan Note (Signed)
Continue Synthroid °

## 2022-07-02 NOTE — Assessment & Plan Note (Signed)
Renal function relatively stable.  Continue to monitor with diuresis.

## 2022-07-02 NOTE — Assessment & Plan Note (Signed)
In setting of acute on chronic diastolic CHF with background of pulmonary hypertension and COPD.  Chronically wears 4 L O2 via [place], requiring 10 L HFNC on admit. -Continue diuresis as above -Wean supplemental O2 to home 4 L via [place] as able

## 2022-07-02 NOTE — Assessment & Plan Note (Signed)
Presenting with worsening resting exertional dyspnea, elevated BNP, mild peripheral edema.  Hypoxic on home 4 L O2 via [place].  TTE 06/03/2022 showed EF 70-75%, G2DD. -Continue IV Lasix 40 mg twice daily -Monitor strict I/O's and daily weights -Continue midodrine 2.5 mg TID for BP support -Hold spironolactone for now with borderline hypotension

## 2022-07-02 NOTE — Assessment & Plan Note (Signed)
Continue home tadalafil and Ambrisentan.

## 2022-07-02 NOTE — Assessment & Plan Note (Signed)
Stable, no wheezing on admission.  Continue albuterol as needed.

## 2022-07-02 NOTE — ED Provider Notes (Signed)
[facility] EMERGENCY DEPARTMENT Provider Note   CSN: [id number] Arrival date & time: 06/14/2022  1558     History  Chief Complaint  Patient presents with   Shortness of Breath    Linda MASSE is a 76 y.o. female history of heart failure, here presenting with shortness of breath.  Patient was recently admitted for heart failure.  Patient saw cardiology about 2 weeks ago.  She was continued on Lasix 40 mg twice daily.  Patient has worsening leg swelling and shortness of breath.  She called her doctor yesterday and was switched to torsemide 40 mg twice daily.  She states that she is feeling worse after taking the torsemide.  She was noted to be very short of breath and EMS was called.  Patient currently desatted to 87% on her baseline 4 L nasal cannula.  Patient was put on 10 L nasal cannula.  The history is provided by the patient.       Home Medications Prior to Admission medications   Medication Sig Start Date End Date Taking? Authorizing Provider  acetaminophen (TYLENOL) 325 MG tablet Take 325 mg by mouth as needed for mild pain.    [provider]  albuterol (VENTOLIN HFA) 108 (90 Base) MCG/ACT inhaler INHALE 2 PUFFS INTO THE LUNGS EVERY 6 HOURS AS NEEDED FOR WHEEZING/SHORTNESS OF BREATH Patient taking differently: Inhale 1 puff into the lungs in the morning, at noon, in the evening, and at bedtime. 03/02/22   Bedsole, Amy E, MD  ambrisentan (LETAIRIS) 10 MG tablet TAKE 1 TABLET BY MOUTH 1 TIME A DAY. DO NOT HANDLE IF PREGNANT.Please make appointment for further refills 03/07/22   Bensimhon, Shaune Pascal, MD  aspirin EC 81 MG EC tablet Take 1 tablet (81 mg total) by mouth daily. 10/09/15   Debbe Odea, MD  ferrous sulfate 325 (65 FE) MG EC tablet TAKE 1 TABLET BY MOUTH TWICE A DAY WITH MEALS 01/09/22   Bedsole, Amy E, MD  fluticasone (FLONASE) 50 MCG/ACT nasal spray Place 1 spray into both nostrils daily. 08/19/21   Cobb, Karie Schwalbe, NP  hydrOXYzine  (ATARAX) 50 MG tablet TAKE 1/2 TO 1 TABLET BY MOUTH AT BEDTIME FOR ANXIETY 04/30/22   Bedsole, Amy E, MD  levothyroxine (SYNTHROID) 75 MCG tablet TAKE 1 TABLET BY MOUTH EVERY DAY 06/01/22   Bedsole, Amy E, MD  loratadine (CLARITIN) 10 MG tablet TAKE 1 TABLET BY MOUTH EVERY DAY 04/10/22   Cobb, Karie Schwalbe, NP  midodrine (PROAMATINE) 2.5 MG tablet Take 1 tablet (2.5 mg total) by mouth 3 (three) times daily with meals. 06/19/22   Milford, Maricela Bo, FNP  OXYGEN Inhale 4 L into the lungs continuous.    [provider]  polyvinyl alcohol (LIQUIFILM TEARS) 1.4 % ophthalmic solution Place 1 drop into both eyes daily as needed for dry eyes.    [provider]  potassium chloride SA (KLOR-CON M20) 20 MEQ tablet TAKE 1 TABLET BY MOUTH EVERY DAY 06/01/22   Bedsole, Amy E, MD  spironolactone (ALDACTONE) 25 MG tablet TAKE 1/2 TABLET BY MOUTH EVERY DAY 11/28/21   Bensimhon, Shaune Pascal, MD  tadalafil, PAH, (ADCIRCA) 20 MG tablet TAKE 2 TABLETS BY MOUTH 1 TIME A DAY. 06/09/22   Bensimhon, Shaune Pascal, MD  torsemide (DEMADEX) 20 MG tablet Take 2 tablets (40 mg total) by mouth 2 (two) times daily. 07/01/22   Rafael Bihari, FNP      Allergies    Amoxicillin-pot clavulanate, Cefdinir, Singulair [montelukast  sodium], and Moxifloxacin    Review of Systems   Review of Systems  Respiratory:  Positive for shortness of breath.   All other systems reviewed and are negative.   Physical Exam Updated Vital Signs BP (!) 112/41 (BP Location: Left Arm)   Pulse 69   Temp 97.6 F (36.4 C) (Oral)   Resp (!) 21   Ht _0  (1.575 m)   Wt 60.8 kg   SpO2 94%   BMI 24.51 kg/m  Physical Exam Vitals and nursing note reviewed.  Constitutional:      Comments: Chronically ill, tachypneic  HENT:     Head: Normocephalic.     Mouth/Throat:     Mouth: Mucous membranes are moist.  Eyes:     Extraocular Movements: Extraocular movements intact.     Pupils: Pupils are equal, round, and reactive to light.   Cardiovascular:     Rate and Rhythm: Normal rate and regular rhythm.  Pulmonary:     Comments: Crackles bilaterally  Abdominal:     General: Bowel sounds are normal.     Palpations: Abdomen is soft.  Musculoskeletal:     Cervical back: Normal range of motion and neck supple.     Comments: 2+ edema bilateral legs   Skin:    General: Skin is warm.     Capillary Refill: Capillary refill takes less than 2 seconds.  Neurological:     General: No focal deficit present.     Mental Status: She is oriented to person, place, and time.  Psychiatric:        Mood and Affect: Mood normal.        Behavior: Behavior normal.     ED Results / Procedures / Treatments   Labs (all labs ordered are listed, but only abnormal results are displayed) Labs Reviewed  I-STAT VENOUS BLOOD GAS, ED - Abnormal; Notable for the following components:      Result Value   pCO2, Ven 74.5 (*)    pO2, Ven 73 (*)    Bicarbonate 45.9 (*)    TCO2 48 (*)    Acid-Base Excess 18.0 (*)    Calcium, Ion 1.12 (*)    HCT 29.0 (*)    Hemoglobin 9.9 (*)    All other components within normal limits  SARS CORONAVIRUS 2 BY RT PCR  CBC WITH DIFFERENTIAL/PLATELET  COMPREHENSIVE METABOLIC PANEL  BRAIN NATRIURETIC PEPTIDE  TROPONIN I (HIGH SENSITIVITY)    EKG None  Radiology No results found.  Procedures Procedures    CRITICAL CARE Performed by: Wandra Arthurs   Total critical care time: 30 minutes  Critical care time was exclusive of separately billable procedures and treating other patients.  Critical care was necessary to treat or prevent imminent or life-threatening deterioration.  Critical care was time spent personally by me on the following activities: development of treatment plan with patient and/or surrogate as well as nursing, discussions with consultants, evaluation of patient's response to treatment, examination of patient, obtaining history from patient or surrogate, ordering and performing  treatments and interventions, ordering and review of laboratory studies, ordering and review of radiographic studies, pulse oximetry and re-evaluation of patient's condition.   Medications Ordered in ED Medications - No data to display  ED Course/ Medical Decision Making/ A&P                           Medical Decision Making HANADI STANLY is a 76 y.o. female  here presenting with shortness of breath and leg swelling.  Patient has heart failure and pulmonary hypertension and is on Lasix at baseline.  She was recently admitted for heart failure exacerbation.  Patient is hypoxic on her baseline 4 L.  She also appears volume overloaded.  Concern for worsening heart failure exacerbation.  Unfortunately her blood pressure is in the low 100s and does not give much room for diuresis.  Plan to get CBC and CMP and COVID and BNP and chest x-ray.  Patient will need to be admitted for CHF exacerbation.  6:12 PM Patient is on high flow 10 L nasal cannula.  Her VBG showed pH of 7.39 and CO2 of 75 which is consistent with cute on chronic hypercapnia.  Patient's BNP is 1700.  Patient was given 40 mg of IV Lasix.  Hospitalist to admit for heart failure exacerbation.  Problems Addressed: Acute on chronic combined systolic and diastolic congestive heart failure ([place]): acute illness or injury Hypoxia: acute illness or injury  Amount and/or Complexity of Data Reviewed Labs: ordered. Decision-making details documented in ED Course. Radiology: ordered and independent interpretation performed. Decision-making details documented in ED Course. ECG/medicine tests: ordered and independent interpretation performed. Decision-making details documented in ED Course.  Risk Prescription drug management. Decision regarding hospitalization.    Final Clinical Impression(s) / ED Diagnoses Final diagnoses:  None    Rx / DC Orders ED Discharge Orders     None         Drenda Freeze, MD 07/07/2022 [id number]

## 2022-07-02 NOTE — H&P (Signed)
History and Physical    LASHAWN Stevens OZH:[id number] DOB: 01/05/1946 DOA: 07/05/2022  PCP: [name], MD  Patient coming from: Home  I have personally briefly reviewed patient's old medical records in [facility]  Chief Complaint: Shortness of breath  HPI: Linda Stevens is a 76 y.o. female with medical history significant for COPD, chronic respiratory failure with hypoxia and hypercarbia on 4-5 L O2 via [place] at home, chronic diastolic CHF, pulmonary HTN with RV failure, PAF not on anticoagulation due to history of recurrent GI bleed/AVMs, hepatic cirrhosis, chronic anemia, CKD stage IIIb who presented to the ED for evaluation of progressive shortness of breath.  Patient reports 1 week of progressive shortness of breath occurring with exertion and at rest.  She cannot lay flat due to shortness of breath.  She has cough productive of cream-colored sputum, no hemoptysis.  She has seen some swelling in both of her feet.  She had labs obtained by her cardiology team which showed elevated BNP.  Due to persistent symptoms she was switched from Lasix and prescribed torsemide 40 mg BID which she started this morning.  Dyspnea persistent for EMS were called.  Per ED documentation, SPO2 was 87% while on home 4 L O2 via [place].  She was brought to the ED for further evaluation.  She otherwise denies any chest pain, nausea, vomiting, abdominal pain, dysuria.  ED Course  Labs/Imaging on admission: I have personally reviewed following labs and imaging studies.  Initial vitals showed BP 112/41, pulse 69, RR 21, temp 97.6 F, SPO2 94% on simple mask, placed on HFNC at 10 L to maintain SPO2 >95%.  Labs show WBC 3.8, hemoglobin 9.5, platelets 134,000, sodium 144, potassium 4.7, bicarb 39, BUN 32, creatinine 1.67, serum glucose 94, LFTs within normal limits, troponin 14, BNP 1740.4.  VBG showed pH 7.398, PCO2 74.5, PO2 73.  SARS-CoV-2 PCR negative.  Portable chest x-ray showing bilateral  pleural effusions and cardiomegaly.  Patient was given IV Lasix 40 mg and the hospitalist service was consulted to admit for further evaluation and management.  Review of Systems: All systems reviewed and are negative except as documented in history of present illness above.   Past Medical History:  Diagnosis Date   Acute respiratory failure with hypoxia and hypercapnea 02/26/2014   Allergic rhinitis due to pollen    Allergy    Anemia 06/12/2020   Arthritis    Asthma    CHF (congestive heart failure) (HCC)    COPD (chronic obstructive pulmonary disease) (HCC)    Diastolic dysfunction    a. 02/2014 Echo: EF 65-70%, Gr 1 DD, RVH;  b. 09/2015 Echo: EF 55%, Gr 1 DD.   Emphysema of lung (HCC)    H/O seasonal allergies    History of chicken pox    History of tobacco abuse    a. Quit 2015.   History of UTI    Hypertension    Hypertensive heart disease    Lower extremity edema    Pericardial effusion    a. 09/2015 Echo: Mod eff w/o tamponade.   Right heart failure with reduced right ventricular function ([place])    a. 09/2015 Echo: EF 55%, Gr 1 DD, D shaped IV septum, sev dil RV with mod reduced fxn, mod dil RA, RV-RA grad 83mHg, PASP 858mg, mod pericard eff w/o tamponade.   Severe Pulmonary Hypertension    a. 09/2015 Echo: PASP 8750m.    Past Surgical History:  Procedure Laterality Date  CARDIAC CATHETERIZATION N/A 10/08/2015   Procedure: Right/Left Heart Cath and Coronary Angiography;  Surgeon: Jolaine Artist, MD;  Location: [facility];  Service: Cardiovascular;  Laterality: N/A;   CHOLECYSTECTOMY     Open   COLONOSCOPY WITH PROPOFOL N/A 06/07/2020   Procedure: COLONOSCOPY WITH PROPOFOL;  Surgeon: Jerene Bears, MD;  Location: [facility];  Service: Gastroenterology;  Laterality: N/A;   ESOPHAGOGASTRODUODENOSCOPY (EGD) WITH PROPOFOL N/A 06/07/2020   Procedure: ESOPHAGOGASTRODUODENOSCOPY (EGD) WITH PROPOFOL;  Surgeon: Jerene Bears, MD;  Location[facility];  Service:  Gastroenterology;  Laterality: N/A;   POLYPECTOMY  06/07/2020   Procedure: POLYPECTOMY;  Surgeon: Jerene Bears, MD;  Location: [facility];  Service: Gastroenterology;;   RIGHT HEART CATH N/A 01/26/2017   Procedure: Right Heart Cath;  Surgeon: Jolaine Artist, MD;  Location: [facility];  Service: Cardiovascular;  Laterality: N/A;   RIGHT HEART CATH N/A 08/12/2017   Procedure: RIGHT HEART CATH;  Surgeon: Jolaine Artist, MD;  Location: [facility];  Service: Cardiovascular;  Laterality: N/A;    Social History:  reports that she quit smoking about 8 years ago. Her smoking use included cigarettes. She has a 40.00 pack-year smoking history. She has never used smokeless tobacco. She reports that she does not drink alcohol and does not use drugs.  Allergies  Allergen Reactions   Amoxicillin-Pot Clavulanate Other (See Comments)    REACTION: gi upset Has patient had a PCN reaction causing immediate rash, facial/tongue/throat swelling, SOB or lightheadedness with hypotension: No Has patient had a PCN reaction causing severe rash involving mucus membranes or skin necrosis: No Has patient had a PCN reaction that required hospitalization : No Has patient had a PCN reaction occurring within the last 10 years: No If all of the above answers are "NO", then may proceed with Cephalosporin use.    Cefdinir Other (See Comments)    gi  upset   Singulair [Montelukast Sodium] Itching        Moxifloxacin Rash    Family History  Problem Relation Age of Onset   Glaucoma Brother    Vascular Disease Father        Died of complications from surgery   Heart attack Father    Asthma Mother        Died of asthma attack   Dementia Mother    Hyperlipidemia Mother    Hypertension Mother      Prior to Admission medications   Medication Sig Start Date End Date Taking? Authorizing Provider  acetaminophen (TYLENOL) 325 MG tablet Take 325 mg by mouth as needed for mild pain.    [provider]  albuterol (VENTOLIN HFA) 108 (90 Base) MCG/ACT inhaler INHALE 2 PUFFS INTO THE LUNGS EVERY 6 HOURS AS NEEDED FOR WHEEZING/SHORTNESS OF BREATH Patient taking differently: Inhale 1 puff into the lungs in the morning, at noon, in the evening, and at bedtime. 03/02/22   Bedsole, Amy E, MD  ambrisentan (LETAIRIS) 10 MG tablet TAKE 1 TABLET BY MOUTH 1 TIME A DAY. DO NOT HANDLE IF PREGNANT.Please make appointment for further refills 03/07/22   Bensimhon, Shaune Pascal, MD  aspirin EC 81 MG EC tablet Take 1 tablet (81 mg total) by mouth daily. 10/09/15   Debbe Odea, MD  ferrous sulfate 325 (65 FE) MG EC tablet TAKE 1 TABLET BY MOUTH TWICE A DAY WITH MEALS 01/09/22   Bedsole, Amy E, MD  fluticasone (FLONASE) 50 MCG/ACT nasal spray Place 1 spray into  both nostrils daily. 08/19/21   Cobb, Karie Schwalbe, NP  hydrOXYzine (ATARAX) 50 MG tablet TAKE 1/2 TO 1 TABLET BY MOUTH AT BEDTIME FOR ANXIETY 04/30/22   Bedsole, Amy E, MD  levothyroxine (SYNTHROID) 75 MCG tablet TAKE 1 TABLET BY MOUTH EVERY DAY 06/01/22   Bedsole, Amy E, MD  loratadine (CLARITIN) 10 MG tablet TAKE 1 TABLET BY MOUTH EVERY DAY 04/10/22   Cobb, Karie Schwalbe, NP  midodrine (PROAMATINE) 2.5 MG tablet Take 1 tablet (2.5 mg total) by mouth 3 (three) times daily with meals. 06/19/22   Milford, Maricela Bo, FNP  OXYGEN Inhale 4 L into the lungs continuous.    [provider]  polyvinyl alcohol (LIQUIFILM TEARS) 1.4 % ophthalmic solution Place 1 drop into both eyes daily as needed for dry eyes.    [provider]  potassium chloride SA (KLOR-CON M20) 20 MEQ tablet TAKE 1 TABLET BY MOUTH EVERY DAY 06/01/22   Bedsole, Amy E, MD  spironolactone (ALDACTONE) 25 MG tablet TAKE 1/2 TABLET BY MOUTH EVERY DAY 11/28/21   Bensimhon, Shaune Pascal, MD  tadalafil, PAH, (ADCIRCA) 20 MG tablet TAKE 2 TABLETS BY MOUTH 1 TIME A DAY. 06/09/22   Bensimhon, Shaune Pascal, MD  torsemide (DEMADEX) 20 MG tablet Take 2 tablets (40 mg total) by mouth 2 (two) times daily.  07/01/22   Rafael Bihari, FNP    Physical Exam: Vitals:   06/24/2022 1830 06/19/2022 1930 06/11/2022 1933 07/01/2022 2000  BP: (!) 105/46 (!) 111/43  (!) 109/47  Pulse: 73 76  76  Resp: _0 Temp:   (!) 97.5 F (36.4 C)   TempSrc:   Oral   SpO2: 99% 99%  99%  Weight:      Height:       Constitutional: Chronically ill-appearing elderly woman resting in bed with head elevated, appears fatigued but in NAD Eyes: EOMI, lids and conjunctivae normal ENMT: Mucous membranes are moist. Posterior pharynx clear of any exudate or lesions.Normal dentition.  Neck: normal, supple, no masses. Respiratory: Basilar inspiratory crackles.  Normal respiratory effort while on 10 L HFNC. No accessory muscle use.  Cardiovascular: Regular rate and rhythm, systolic murmur present.  Trace bilateral lower extremity edema. 2+ pedal pulses. Abdomen: no tenderness, no masses palpated.  Musculoskeletal: no clubbing / cyanosis. No joint deformity upper and lower extremities. Good ROM, no contractures. Normal muscle tone.  Skin: no rashes, lesions, ulcers. No induration Neurologic: Sensation intact. Strength equal bilaterally. Psychiatric: Alert and oriented x 3. Normal mood.   EKG: Personally reviewed. Sinus rhythm, first-degree AV block, biatrial enlargement.  Similar to prior.  Assessment/Plan Principal Problem:   Acute on chronic diastolic (congestive) heart failure (HCC) Active Problems:   Acute on chronic respiratory failure with hypoxia and hypercapnia (HCC)   Hypotension   Pulmonary artery hypertension (HCC)   COPD GOLD II criteria but 02 dep 24/7    Stage 3b chronic kidney disease (CKD) (HCC)   Anemia   Hypothyroid   AUDRA KAGEL is a 76 y.o. female with medical history significant for COPD, chronic respiratory failure with hypoxia and hypercarbia on 4-5 L O2 via [place] at home, chronic diastolic CHF, pulmonary HTN with RV failure, PAF not on anticoagulation due to history of recurrent GI  bleed/AVMs, hepatic cirrhosis, chronic anemia, CKD stage IIIb who is admitted with acute on chronic hypoxic respiratory failure due to acute on chronic diastolic CHF exacerbation.  Assessment and Plan: * Acute on chronic diastolic (congestive) heart failure (  [place]) Presenting with worsening resting exertional dyspnea, elevated BNP, mild peripheral edema.  Hypoxic on home 4 L O2 via [place].  TTE 06/03/2022 showed EF 70-75%, G2DD. -Continue IV Lasix 40 mg twice daily -Monitor strict I/O's and daily weights -Continue midodrine 2.5 mg TID for BP support -Hold spironolactone for now with borderline hypotension  Acute on chronic respiratory failure with hypoxia and hypercapnia (HCC) In setting of acute on chronic diastolic CHF with background of pulmonary hypertension and COPD.  Chronically wears 4 L O2 via [place], requiring 10 L HFNC on admit. -Continue diuresis as above -Wean supplemental O2 to home 4 L via [place] as able  Pulmonary artery hypertension ([place]) Continue home tadalafil and Ambrisentan.  Hypotension Transient hypotension with BPs maintaining borderline low. -Continue midodrine 2.5 mg TID, can increase if needed -Hold spironolactone for now -Monitor closely with diuresis  COPD GOLD II criteria but 02 dep 24/7  Stable, no wheezing on admission.  Continue albuterol as needed.  Stage 3b chronic kidney disease (CKD) ([place]) Renal function relatively stable.  Continue to monitor with diuresis.  Hypothyroid Continue Synthroid.  Anemia Secondary to chronic disease and chronic GI bleeding/AVMs.  Hemoglobin stable at 9.5.  DVT prophylaxis: heparin injection 5,000 Units Start: 06/28/2022 2200 Code Status: Partial code/no intubation. Family Communication: Husband at bedside Disposition Plan: From home, dispo pending clinical progress Consults called: None Severity of Illness: The appropriate patient status for this patient is INPATIENT. Inpatient status is judged to be reasonable and necessary in  order to provide the required intensity of service to ensure the patient's safety. The patient's presenting symptoms, physical exam findings, and initial radiographic and laboratory data in the context of their chronic comorbidities is felt to place them at high risk for further clinical deterioration. Furthermore, it is not anticipated that the patient will be medically stable for discharge from the hospital within 2 midnights of admission.   * I certify that at the point of admission it is my clinical judgment that the patient will require inpatient hospital care spanning beyond 2 midnights from the point of admission due to high intensity of service, high risk for further deterioration and high frequency of surveillance required.Zada Finders MD [facility]  If 7PM-7AM, please contact night-coverage [email or web address]  07/04/2022, 9:11 PM

## 2022-07-02 NOTE — Telephone Encounter (Signed)
Pts home health RN called to report pts lungs sound "terrible" with crackles.  Pt has +4 pitting edema. Pt was switched to torsemide but has low urine output. Per Darden Palmer pt needs to be seen in the emergency room. Pt will be transported via EMS.

## 2022-07-02 NOTE — ED Notes (Signed)
Verbal report given to Crissie Figures RN at this time

## 2022-07-02 NOTE — Assessment & Plan Note (Signed)
Transient hypotension with BPs maintaining borderline low. -Continue midodrine 2.5 mg TID, can increase if needed -Hold spironolactone for now -Monitor closely with diuresis

## 2022-07-03 ENCOUNTER — Inpatient Hospital Stay (HOSPITAL_COMMUNITY): Payer: Medicare HMO

## 2022-07-03 DIAGNOSIS — I5033 Acute on chronic diastolic (congestive) heart failure: Secondary | ICD-10-CM | POA: Diagnosis not present

## 2022-07-03 DIAGNOSIS — J90 Pleural effusion, not elsewhere classified: Secondary | ICD-10-CM | POA: Diagnosis not present

## 2022-07-03 DIAGNOSIS — J9621 Acute and chronic respiratory failure with hypoxia: Secondary | ICD-10-CM | POA: Diagnosis not present

## 2022-07-03 DIAGNOSIS — J9622 Acute and chronic respiratory failure with hypercapnia: Secondary | ICD-10-CM

## 2022-07-03 DIAGNOSIS — R0602 Shortness of breath: Secondary | ICD-10-CM | POA: Diagnosis not present

## 2022-07-03 DIAGNOSIS — Z515 Encounter for palliative care: Secondary | ICD-10-CM

## 2022-07-03 DIAGNOSIS — Z7189 Other specified counseling: Secondary | ICD-10-CM | POA: Diagnosis not present

## 2022-07-03 LAB — BLOOD GAS, ARTERIAL
Acid-Base Excess: 14.1 mmol/L — ABNORMAL HIGH (ref 0.0–2.0)
Acid-Base Excess: 14.2 mmol/L — ABNORMAL HIGH (ref 0.0–2.0)
Bicarbonate: 47.1 mmol/L — ABNORMAL HIGH (ref 20.0–28.0)
Bicarbonate: 45.9 mmol/L — ABNORMAL HIGH (ref 20.0–28.0)
Drawn by: 59156
Drawn by: 35849
O2 Saturation: 99.2 %
O2 Saturation: 99.4 %
Patient temperature: 36.4
Patient temperature: 37
pCO2 arterial: 112 mmHg (ref 32–48)
pCO2 arterial: 100 mmHg (ref 32–48)
pH, Arterial: 7.23 — ABNORMAL LOW (ref 7.35–7.45)
pH, Arterial: 7.27 — ABNORMAL LOW (ref 7.35–7.45)
pO2, Arterial: 140 mmHg — ABNORMAL HIGH (ref 83–108)
pO2, Arterial: 178 mmHg — ABNORMAL HIGH (ref 83–108)

## 2022-07-03 LAB — RESPIRATORY PANEL BY PCR

## 2022-07-03 LAB — CBC
HCT: 33 % — ABNORMAL LOW (ref 36.0–46.0)
Hemoglobin: 9.6 g/dL — ABNORMAL LOW (ref 12.0–15.0)
MCH: 32.8 pg (ref 26.0–34.0)
MCHC: 29.1 g/dL — ABNORMAL LOW (ref 30.0–36.0)
MCV: 112.6 fL — ABNORMAL HIGH (ref 80.0–100.0)
Platelets: 157 10*3/uL (ref 150–400)
RBC: 2.93 MIL/uL — ABNORMAL LOW (ref 3.87–5.11)
RDW: 19.3 % — ABNORMAL HIGH (ref 11.5–15.5)
WBC: 5.2 10*3/uL (ref 4.0–10.5)
nRBC: 0 % (ref 0.0–0.2)

## 2022-07-03 LAB — BASIC METABOLIC PANEL
Anion gap: 10 (ref 5–15)
BUN: 33 mg/dL — ABNORMAL HIGH (ref 8–23)
CO2: 42 mmol/L — ABNORMAL HIGH (ref 22–32)
Calcium: 8.9 mg/dL (ref 8.9–10.3)
Chloride: 94 mmol/L — ABNORMAL LOW (ref 98–111)
Creatinine, Ser: 1.94 mg/dL — ABNORMAL HIGH (ref 0.44–1.00)
GFR, Estimated: 27 mL/min — ABNORMAL LOW (ref >60)
Glucose, Bld: 93 mg/dL (ref 70–99)
Potassium: 5.1 mmol/L (ref 3.5–5.1)
Sodium: 146 mmol/L — ABNORMAL HIGH (ref 135–145)

## 2022-07-03 LAB — LACTIC ACID, PLASMA: Lactic Acid, Venous: 0.9 mmol/L (ref 0.5–1.9)

## 2022-07-03 LAB — AMMONIA: Ammonia: 88 umol/L — ABNORMAL HIGH (ref 9–35)

## 2022-07-03 LAB — PROCALCITONIN: Procalcitonin: 0.4 ng/mL

## 2022-07-03 LAB — TSH: TSH: 7.407 u[IU]/mL — ABNORMAL HIGH (ref 0.350–4.500)

## 2022-07-03 MED ORDER — HYDROMORPHONE BOLUS VIA INFUSION
1.0000 mg | INTRAVENOUS | Status: DC | PRN
  Administered 2022-07-03: 1 mg via INTRAVENOUS

## 2022-07-03 MED ORDER — BIOTENE DRY MOUTH MT LIQD: 15.0000 mL | OROMUCOSAL | Status: DC | PRN

## 2022-07-03 MED ORDER — SODIUM CHLORIDE 0.9 % IV SOLN
1.0000 mg/h | INTRAVENOUS | Status: DC
  Administered 2022-07-03: 1 mg/h via INTRAVENOUS
  Filled 2022-07-03: qty 2.5

## 2022-07-03 MED ORDER — POLYVINYL ALCOHOL 1.4 % OP SOLN: 1.0000 [drp] | Freq: Four times a day (QID) | OPHTHALMIC | Status: DC | PRN

## 2022-07-03 MED ORDER — MIDODRINE HCL 5 MG PO TABS: 10.0000 mg | ORAL_TABLET | Freq: Two times a day (BID) | ORAL | Status: DC

## 2022-07-03 MED ORDER — HYDROMORPHONE HCL 1 MG/ML IJ SOLN: 1.0000 mg | INTRAMUSCULAR | Status: DC | PRN

## 2022-07-03 MED ORDER — IPRATROPIUM-ALBUTEROL 0.5-2.5 (3) MG/3ML IN SOLN: 3.0000 mL | Freq: Four times a day (QID) | RESPIRATORY_TRACT | Status: DC

## 2022-07-03 MED ORDER — LORAZEPAM 2 MG/ML IJ SOLN: 1.0000 mg | INTRAMUSCULAR | Status: DC | PRN

## 2022-07-03 MED ORDER — GLYCOPYRROLATE 0.2 MG/ML IJ SOLN: 0.4000 mg | INTRAMUSCULAR | Status: DC

## 2022-07-03 MED ORDER — IPRATROPIUM-ALBUTEROL 0.5-2.5 (3) MG/3ML IN SOLN
3.0000 mL | Freq: Four times a day (QID) | RESPIRATORY_TRACT | Status: DC
  Administered 2022-07-03: 3 mL via RESPIRATORY_TRACT
  Filled 2022-07-03: qty 3

## 2022-07-08 LAB — CULTURE, BLOOD (ROUTINE X 2)
Culture: NO GROWTH
Culture: NO GROWTH
Special Requests: ADEQUATE
Special Requests: ADEQUATE

## 2022-07-09 ENCOUNTER — Other Ambulatory Visit (HOSPITAL_COMMUNITY): Payer: Self-pay | Admitting: Internal Medicine

## 2022-07-09 NOTE — Consult Note (Signed)
   [facility] Inpatient Consult   07/05/2022  ANNELL CANTY 06-14-1946 [id number]  [facility] Organization [ACO] Patient: [name] Medicare  Primary Care Provider:  [name], MD with [place] at [facility] which is listed for the transition of care follow up   Patient screened for less than 30 days readmission hospitalization to assess for potential [place] Management service needs for post hospital transition.  Patient discussed in morning progression meeting. Review of patient's electronic medical record reveals patient is currently for comfort measures. .    For questions contact:   Natividad Brood, RN BSN [facility] Liaison  [phone] business mobile phone Toll free office [phone]  Fax number: [phone] [email or web address]

## 2022-07-09 NOTE — ED Notes (Addendum)
ABG reported from [place] with a PC02 of 112. Contacted MD.

## 2022-07-09 NOTE — Progress Notes (Signed)
Pt. Obtained ABG from patient which yielded a co2>110. RT notified Dr. Christy Gentles immediately and MD gave permission to place pt. On bipap. Pt. Is tolerating bipap well at this time. Pt. HR 82, RR 13 and sats 97%.

## 2022-07-09 NOTE — ED Provider Notes (Signed)
I was asked to evaluate the patient due to worsening arterial blood gas.  Patient appears to have acute on chronic hypercapnia.  On my arrival to room patient appears altered.  However she is arousable to voice and pain.  Per chart, patient is a DO NOT INTUBATE.  Will place on noninvasive ventilation.  I have consulted the hospitalist managing her (Dr. Marlowe Sax) who will need to upgrade her bed   Ripley Fraise, MD 07-19-22 [id number]

## 2022-07-09 NOTE — Progress Notes (Signed)
   Jul 16, 2022 0841  Assess: MEWS Score  BP (!) 93/45  MAP (mmHg) (!) 60  Pulse Rate 81  ECG Heart Rate 83  Resp (!) 21  Level of Consciousness Responds to Voice  SpO2 95 %  O2 Device Bi-PAP  FiO2 (%) 60 %  Assess: MEWS Score  MEWS Temp 2  MEWS Systolic 1  MEWS Pulse 0  MEWS RR 1  MEWS LOC 1  MEWS Score 5  MEWS Score Color Red  Assess: if the MEWS score is Yellow or Red  Were vital signs taken at a resting state? Yes  Focused Assessment No change from prior assessment  Does the patient meet 2 or more of the SIRS criteria? Yes  Does the patient have a confirmed or suspected source of infection? No  MEWS guidelines implemented *See Row Information* Yes  Treat  Pain Scale 0-10  Pain Score 0  Escalate  MEWS: Escalate Red: discuss with charge nurse/RN and provider, consider discussing with RRT  Notify: Charge Nurse/RN  Date Charge Nurse/RN Notified 07-16-22  Time Charge Nurse/RN Notified 0913  Notify: Provider  Provider Name/Title dr Broadus John  Date Provider Notified 2022/07/16  Time Provider Notified [phone]  Method of Notification Rounds (MD rounded and discussed plan of care)  Notification Reason Other (Comment) (new adm on bipap and low bp)  Provider response In department;See new orders;At bedside  Date of Provider Response 07/16/22  Time of Provider Response [phone]  Document  Patient Outcome Not stable and remains on department  Progress note created (see row info) Yes  Assess: SIRS CRITERIA  SIRS Temperature  1  SIRS Pulse 0  SIRS Respirations  1  SIRS WBC 1  SIRS Score Sum  3   New admission from ED on bipap,low BP noted and discussed with MD who is. rounding during this time.New ordered received for midodrine, blood cultures and blood gas

## 2022-07-09 NOTE — ED Notes (Signed)
Pt provided with warm blankets and MD notified of temperature.

## 2022-07-09 NOTE — Progress Notes (Signed)
PROGRESS NOTE    Linda Stevens  DEY:[id number] DOB: 10/04/1945 DOA: 07/01/2022 PCP: [name], MD    76 y.o. female chronically ill with COPD, chronic respiratory failure with hypoxia and hypercarbia on 4-5 L O2 via [place] at home, chronic diastolic CHF, pulmonary HTN with RV failure, PAF not on anticoagulation due to history of recurrent GI bleed/AVMs, hepatic cirrhosis, chronic anemia, CKD stage IIIb who presented to the ED for evaluation of progressive shortness of breath History of ongoing symptoms X 1 week with dyspnea and orthopnea, productive cough. -Switched to torsemide yesterday just prior to admission, EMS was called, O2 sats were 87% on 4 L -In the ED more hypoxic, placed on 10 L high flow nasal cannula, labs noted WBC of 3.8, creatinine 1.6, troponin 14, BNP 1740 -Chest x-ray with bilateral pleural effusions -Increased work of breathing earlier this morning in the ED, placed on BiPAP, ABG with hypercarbic respiratory acidosis  Subjective: -Patient seen on BiPAP, unable to provide any history, opens eyes, mumbles single words  Assessment and Plan:  Acute on chronic hypoxic and hypercarbic respiratory failure Acute on chronic diastolic CHF  RV failure, severe pulmonary hypertension -Echo 9/26 with EF of 70-75%, moderate LVH, grade 2 diastolic dysfunction -Appears volume overloaded, continue IV Lasix today -Marginal BP as always, increase midodrine dose, hold Aldactone -Continue BiPAP, inspiratory and expiratory pressures increased, recheck ABG in 2 to 3 hours, limited code , DO NOT INTUBATE -will request CHF team input -GDMT limited by low BPs  COPD/chronic hypoxic respiratory failure -At baseline on 4 to 5 L home O2 -Continue DuoNebs, imaging without findings for of new pneumonia -No fever or leukocytosis -Reported productive cough in the ED, COVID PCR is negative  Hypothermia -Likely secondary to respiratory failure, known liver disease, low BPs etc. -Also  check blood cultures, influenza PCR, lactate, TSH  Pulmonary artery hypertension (HCC) Continue home tadalafil and Ambrisentan.  Stage 3b chronic kidney disease (CKD) ([place]) Renal function relatively stable.  Continue to monitor with diuresis.  Hypothyroid Continue Synthroid.  Anemia Secondary to chronic disease and chronic GI bleeding/AVMs.  Hemoglobin stable at 9.5.  Liver cirrhosis -check ammonia level as well  DVT prophylaxis: Heparin subcutaneous Code Status: Limited code, DO NOT INTUBATE, yes for everything else Family Communication: No family at bedside, will update spouse Disposition Plan: Inpatient, sick  Consultants: CHF teAM   Procedures:   Antimicrobials:    Objective: Vitals:   2022-07-19 0745 Jul 19, 2022 0746 2022/07/19 0822 07-19-2022 0841  BP: (!) 104/38  (!) 87/44 (!) 93/45  Pulse: 81   81  Resp: 19   (!) 21  Temp:      TempSrc:      SpO2: 99% 98%  95%  Weight:      Height:        Intake/Output Summary (Last 24 hours) at 07-19-2022 3149 Last data filed at 07/01/2022 1831 Gross per 24 hour  Intake --  Output 400 ml  Net -400 ml   Filed Weights   06/14/2022 1605  Weight: 60.8 kg    Examination:  General exam: somnolent on BiPAP, opens eyes, mumbles few words HEENT: Positive JVD CVS: S1-S2, regular rhythm Lungs: Poor air movement bilaterally Abdomen: Soft, nontender, bowel sounds present Extremities: 1+ edema in upper thighs and flank Skin: Pink daily rash in lower legs Psychiatry: Unable to assess    Data Reviewed:   CBC: Recent Labs  Lab 07/08/2022 1617 06/16/2022 1637 07/19/2022 0500  WBC 3.8*  --  5.2  NEUTROABS 2.4  --   --   HGB 9.5* 9.9* 9.6*  HCT 30.4* 29.0* 33.0*  MCV 106.3*  --  112.6*  PLT 134*  --  938   Basic Metabolic Panel: Recent Labs  Lab 06/30/22 1351 06/23/2022 1617 07/01/2022 1637 07/28/22 0500  NA 144 144 141 146*  K 4.2 4.7 4.7 5.1  CL 95* 92*  --  94*  CO2 37* 39*  --  42*  GLUCOSE 139* 94  --  93  BUN 29*  32*  --  33*  CREATININE 1.58* 1.67*  --  1.94*  CALCIUM 9.0 9.1  --  8.9   GFR: Estimated Creatinine Clearance: 21.8 mL/min (A) (by C-G formula based on SCr of 1.94 mg/dL (H)). Liver Function Tests: Recent Labs  Lab 06/11/2022 1617  AST 25  ALT 16  ALKPHOS 97  BILITOT 0.6  PROT 6.1*  ALBUMIN 3.3*   No results for input(s): "LIPASE", "AMYLASE" in the last 168 hours. No results for input(s): "AMMONIA" in the last 168 hours. Coagulation Profile: No results for input(s): "INR", "PROTIME" in the last 168 hours. Cardiac Enzymes: No results for input(s): "CKTOTAL", "CKMB", "CKMBINDEX", "TROPONINI" in the last 168 hours. BNP (last 3 results) No results for input(s): "PROBNP" in the last 8760 hours. HbA1C: No results for input(s): "HGBA1C" in the last 72 hours. CBG: No results for input(s): "GLUCAP" in the last 168 hours. Lipid Profile: No results for input(s): "CHOL", "HDL", "LDLCALC", "TRIG", "CHOLHDL", "LDLDIRECT" in the last 72 hours. Thyroid Function Tests: No results for input(s): "TSH", "T4TOTAL", "FREET4", "T3FREE", "THYROIDAB" in the last 72 hours. Anemia Panel: No results for input(s): "VITAMINB12", "FOLATE", "FERRITIN", "TIBC", "IRON", "RETICCTPCT" in the last 72 hours. Urine analysis:    Component Value Date/Time   COLORURINE YELLOW 05/05/2021 2154   APPEARANCEUR HAZY (A) 05/05/2021 2154   LABSPEC 1.021 05/05/2021 2154   PHURINE 5.0 05/05/2021 2154   GLUCOSEU NEGATIVE 05/05/2021 2154   GLUCOSEU NEGATIVE 07/07/2017 1625   HGBUR NEGATIVE 05/05/2021 2154   HGBUR negative 12/22/2007 1139   BILIRUBINUR NEGATIVE 05/05/2021 2154   KETONESUR NEGATIVE 05/05/2021 2154   PROTEINUR NEGATIVE 05/05/2021 2154   UROBILINOGEN 0.2 07/07/2017 1625   NITRITE NEGATIVE 05/05/2021 2154   LEUKOCYTESUR TRACE (A) 05/05/2021 2154   Sepsis Labs: _0 (procalcitonin:4,lacticidven:4)  ) Recent Results (from the past 240 hour(s))  SARS Coronavirus 2 by RT PCR (hospital order,  performed in [place] hospital lab) *cepheid single result test* Anterior Nasal Swab     Status: None   Collection Time: 07/05/2022  4:04 PM   Specimen: Anterior Nasal Swab  Result Value Ref Range Status   SARS Coronavirus 2 by RT PCR NEGATIVE NEGATIVE Final    Comment: (NOTE) SARS-CoV-2 target nucleic acids are NOT DETECTED.  The SARS-CoV-2 RNA is generally detectable in upper and lower respiratory specimens during the acute phase of infection. The lowest concentration of SARS-CoV-2 viral copies this assay can detect is 250 copies / mL. A negative result does not preclude SARS-CoV-2 infection and should not be used as the sole basis for treatment or other patient management decisions.  A negative result may occur with improper specimen collection / handling, submission of specimen other than nasopharyngeal swab, presence of viral mutation(s) within the areas targeted by this assay, and inadequate number of viral copies (<250 copies / mL). A negative result must be combined with clinical observations, patient history, and epidemiological information.  Fact Sheet for Patients:   https://www.patel.info/  Fact Sheet for Healthcare  Providers: https://hall.com/  This test is not yet approved or  cleared by the [place] and has been authorized for detection and/or diagnosis of SARS-CoV-2 by FDA under an Emergency Use Authorization (EUA).  This EUA will remain in effect (meaning this test can be used) for the duration of the COVID-19 declaration under Section 564(b)(1) of the Act, 21 U.S.C. section 360bbb-3(b)(1), unless the authorization is terminated or revoked sooner.  Performed at [facility], [place] [address] [place], [place] [address]      Radiology Studies: DG CHEST PORT 1 VIEW  Result Date: 07/25/2022 CLINICAL DATA:  Shortness of breath EXAM: PORTABLE CHEST 1 VIEW COMPARISON:  Chest radiograph 06/17/2022 FINDINGS:  The heart is enlarged, unchanged. The upper mediastinal contours are stable. Small right larger than left pleural effusions with adjacent airspace opacities, worse in the right base, are unchanged. The upper lungs remain aerated. There is no new or worsening focal airspace disease. There is no pneumothorax There is no acute osseous abnormality. IMPRESSION: Stable right larger than left pleural effusions with bibasilar airspace opacities which may reflect atelectasis or pneumonia. Electronically Signed   By: Valetta Mole M.D.   On: 25-Jul-2022 08:06   DG Chest Port 1 View  Result Date: 06/13/2022 CLINICAL DATA:  Heart failure.  Short of breath EXAM: PORTABLE CHEST 1 VIEW COMPARISON:  CT 06/03/2022, chest radiograph 06/02/2022 FINDINGS: Normal cardiac silhouette. Bilateral pleural effusions greater on the RIGHT. Findings similar comparison CT. Upper lungs clear. No pneumothorax. Atherosclerotic calcification of the aorta. IMPRESSION: Bilateral pleural effusions and cardiomegaly similar to comparison exam. Electronically Signed   By: Suzy Bouchard M.D.   On: 06/14/2022 16:46     Scheduled Meds:  ambrisentan  10 mg Oral Daily   aspirin EC  81 mg Oral Daily   ferrous sulfate  325 mg Oral BID WC   furosemide  40 mg Intravenous Q12H   heparin  5,000 Units Subcutaneous Q8H   hydrOXYzine  25-50 mg Oral QHS   levothyroxine  75 mcg Oral Q0600   midodrine  10 mg Oral BID WC   potassium chloride SA  20 mEq Oral Daily   sodium chloride flush  3 mL Intravenous Q12H   tadalafil  40 mg Oral Daily   Continuous Infusions:   LOS: 1 day    Time spent: 42mn    PDomenic Polite MD [facility]   111/17/23 9:04 AM

## 2022-07-09 NOTE — ED Notes (Signed)
Patient taken from 10L humidified 02 to 14L 02 via nasal cannula by Respiratory therapy

## 2022-07-09 NOTE — Consult Note (Addendum)
Advanced Heart Failure Team Consult Note   Primary Physician: [name], MD PCP-Cardiologist:  [name], MD  Reason for Consultation: Acute on chronic diastolic CHF  HPI:    Linda Stevens is seen today for evaluation of acute on chronic diastolic CHF at the request of Dr. Broadus John with [facility].   76 y.o. female with history of chronic diastolic CHF with RV failure, PAH, COPD, chronic respiratory failure with hypoxia on 4L O2 chronically, suspected CTD, recurrent R pleural effusions requiring thoracentesis in 2018, hx GI bleed 2021 2/2 colonic AVMs, PAF.     R/LHC 10/08/15 with normal coronaries, normal CO, and severe PAH PAP 104/49 with 13.6 WU.  PAH well out of proportion to left-sided filling pressures and COPD. Started revatio 20 TID. PFTs 10/05/15  FEV1 0.91 (40%) FVC 1.64 (55%), DLCO 8.80 (38%). Auto-immune and infectious serologies negative. VQ negative.  RF markedly ++ but other serology negative. Saw Dr. Amil Amen who said she did not have RA but was diagnosed with polyarthralgia/OA.    Admitted 6/21 with respiratory failure. Echo EF 55-60% with mild RV HK with large peridardial effusion. Suspicion for auto-immune flare. ESR 63. Auto-immune panel again negative. Treated with steroids with prompt improvement and regression of pericardial effusion.   Recent admit 09/23 with a/c respiratory failure with hypoxia suspected to be due to combination of ? COPD exacerbation and a/c CHF.  Had not been taking diuretics correctly at home and was drinking lots of fluids. She diuresed with IV lasix, DuoNebs and prednisone. AHF team consulted to assist with management.  Repeat echo showed EF 60-65%, RV ok. Hospitalization complicated by multifocal PNA and AKI w/ hyperkalemia. She was discharged on 4L oxygen (baseline). Discharge weight 141.   Presented to ED via EMS yesterday afternoon with complaints of worsening dyspnea and leg swelling. When EMS arrived O2 sats were 87% on 4L [place], O2  increased to 10L [facility]NC. BNP 1700. CSR with bilateral pleural effusions. VBG with pH of 7.39 and CO2 of 75.Given 40 mg lasix IV and admission recommended for a/c CHF.  Overnight developed increased work of breathing and hypoxia accompanied by altered mental status. ABG pH 7.2, pCO2 112, HCO3 47, O2 140. Repeat CSR with stable R>L pleural effusions, bibasilar airspace opacities d/t atx vs PNA. She was given another 40 mg lasix IV and placed on BiPAP. She was admitted to progressive care unit for further management of acute on chronic respiratory failure with hypoxia and hypercarbia and a/c CHF.  This am: BP 90s/30s-40s, SR 80s, hypothermic. O2 sats 95% on BiPAP, FiO2 60%  Lactic acid, BC X 2, ammonia level and repeat ABG pending  Patient seen on BiPAP. Opens her eyes to voice but unable to provide any history.   Her husband is present at bedside and reports she has been struggling since her last admission. The last few days she has been extremely weak. Her family had to carry her to the car for her lab appointment earlier this week. Minimal PO intake. She told her daughter last night that she was "tired and ready to go".   Review of Systems:  Unable to assess d/t mental status   Home Medications Prior to Admission medications   Medication Sig Start Date End Date Taking? Authorizing Provider  acetaminophen (TYLENOL) 325 MG tablet Take 325 mg by mouth as needed for mild pain.   Yes [provider]  albuterol (VENTOLIN HFA) 108 (90 Base) MCG/ACT inhaler INHALE 2 PUFFS INTO THE LUNGS  EVERY 6 HOURS AS NEEDED FOR WHEEZING/SHORTNESS OF BREATH Patient taking differently: Inhale 1 puff into the lungs daily as needed for wheezing or shortness of breath. 03/02/22  Yes Bedsole, Amy E, MD  ambrisentan (LETAIRIS) 10 MG tablet TAKE 1 TABLET BY MOUTH 1 TIME A DAY. DO NOT HANDLE IF PREGNANT.Please make appointment for further refills Patient taking differently: Take 10 mg by mouth daily. TAKE 1 TABLET BY  MOUTH 1 TIME A DAY. DO NOT HANDLE IF PREGNANT.Please make appointment for further refills 03/07/22  Yes Ordean Fouts, Shaune Pascal, MD  aspirin EC 81 MG EC tablet Take 1 tablet (81 mg total) by mouth daily. 10/09/15  Yes Debbe Odea, MD  ferrous sulfate 325 (65 FE) MG EC tablet TAKE 1 TABLET BY MOUTH TWICE A DAY WITH MEALS Patient taking differently: Take 325 mg by mouth 2 (two) times daily with a meal. 01/09/22  Yes Bedsole, Amy E, MD  fluticasone (FLONASE) 50 MCG/ACT nasal spray Place 1 spray into both nostrils daily. Patient taking differently: Place 1 spray into both nostrils daily as needed for allergies or rhinitis. 08/19/21  Yes Cobb, Karie Schwalbe, NP  hydrOXYzine (ATARAX) 50 MG tablet TAKE 1/2 TO 1 TABLET BY MOUTH AT BEDTIME FOR ANXIETY Patient taking differently: Take 25-50 mg by mouth at bedtime. 04/30/22  Yes Bedsole, Amy E, MD  levothyroxine (SYNTHROID) 75 MCG tablet TAKE 1 TABLET BY MOUTH EVERY DAY Patient taking differently: Take 75 mcg by mouth daily. 06/01/22  Yes Bedsole, Amy E, MD  loratadine (CLARITIN) 10 MG tablet TAKE 1 TABLET BY MOUTH EVERY DAY Patient taking differently: Take 10 mg by mouth daily. 04/10/22  Yes Cobb, Karie Schwalbe, NP  midodrine (PROAMATINE) 2.5 MG tablet Take 1 tablet (2.5 mg total) by mouth 3 (three) times daily with meals. 06/19/22  Yes [place], [place], FNP  OXYGEN Inhale 4 L into the lungs continuous.   Yes [provider]  polyvinyl alcohol (LIQUIFILM TEARS) 1.4 % ophthalmic solution Place 1 drop into both eyes daily as needed for dry eyes.   Yes [provider]  potassium chloride SA (KLOR-CON M20) 20 MEQ tablet TAKE 1 TABLET BY MOUTH EVERY DAY Patient taking differently: Take 20 mEq by mouth daily. 06/01/22  Yes Bedsole, Amy E, MD  spironolactone (ALDACTONE) 25 MG tablet TAKE 1/2 TABLET BY MOUTH EVERY DAY Patient taking differently: Take 12.5 mg by mouth daily. 11/28/21  Yes Anavictoria Wilk, Shaune Pascal, MD  tadalafil, PAH, (ADCIRCA) 20 MG tablet TAKE 2  TABLETS BY MOUTH 1 TIME A DAY. Patient taking differently: Take 40 mg by mouth daily. 06/09/22  Yes Sherisa Gilvin, Shaune Pascal, MD  torsemide (DEMADEX) 20 MG tablet Take 2 tablets (40 mg total) by mouth 2 (two) times daily. 07/01/22  Yes Rafael Bihari, FNP    Past Medical History: Past Medical History:  Diagnosis Date   Acute respiratory failure with hypoxia and hypercapnea 02/26/2014   Allergic rhinitis due to pollen    Allergy    Anemia 06/12/2020   Arthritis    Asthma    CHF (congestive heart failure) (HCC)    COPD (chronic obstructive pulmonary disease) (HCC)    Diastolic dysfunction    a. 02/2014 Echo: EF 65-70%, Gr 1 DD, RVH;  b. 09/2015 Echo: EF 55%, Gr 1 DD.   Emphysema of lung (HCC)    H/O seasonal allergies    History of chicken pox    History of tobacco abuse    a. Quit 2015.   History of UTI  Hypertension    Hypertensive heart disease    Lower extremity edema    Pericardial effusion    a. 09/2015 Echo: Mod eff w/o tamponade.   Right heart failure with reduced right ventricular function ([place])    a. 09/2015 Echo: EF 55%, Gr 1 DD, D shaped IV septum, sev dil RV with mod reduced fxn, mod dil RA, RV-RA grad 35mHg, PASP 827mg, mod pericard eff w/o tamponade.   Severe Pulmonary Hypertension    a. 09/2015 Echo: PASP 8762m.    Past Surgical History: Past Surgical History:  Procedure Laterality Date   CARDIAC CATHETERIZATION N/A 10/08/2015   Procedure: Right/Left Heart Cath and Coronary Angiography;  Surgeon: DanJolaine ArtistD;  Location: [facility];  Service: Cardiovascular;  Laterality: N/A;   CHOLECYSTECTOMY     Open   COLONOSCOPY WITH PROPOFOL N/A 06/07/2020   Procedure: COLONOSCOPY WITH PROPOFOL;  Surgeon: PyrJerene BearsD;  [facility];  Laterality: N/A;   ESOPHAGOGASTRODUODENOSCOPY (EGD) WITH PROPOFOL N/A 06/07/2020   Procedure: ESOPHAGOGASTRODUODENOSCOPY (EGD) WITH PROPOFOL;  Surgeon: PyrJerene BearsD;  [facility];  Service: Gastroenterology;  Laterality: N/A;   POLYPECTOMY  06/07/2020   Procedure: POLYPECTOMY;  Surgeon: PyrJerene BearsD;  [facility];;   RIGHT HEART CATH N/A 01/26/2017   Procedure: Right Heart Cath;  Surgeon: BenJolaine ArtistD;  Location: [facility];  Service: Cardiovascular;  Laterality: N/A;   RIGHT HEART CATH N/A 08/12/2017   Procedure: RIGHT HEART CATH;  Surgeon: BenJolaine ArtistD;  Location: [facility];  Service: Cardiovascular;  Laterality: N/A;    Family History: Family History  Problem Relation Age of Onset   Glaucoma Brother    Vascular Disease Father        Died of complications from surgery   Heart attack Father    Asthma Mother        Died of asthma attack   Dementia Mother    Hyperlipidemia Mother    Hypertension Mother     Social History: Social History   Socioeconomic History   Marital status: Married    Spouse name: Not on file   Number of children: Not on file   Years of education: 2   Highest education level: Not on file  Occupational History   Occupation: retired teaPharmacist, hospitalobacco Use   Smoking status: Former    Packs/day: 1.00    Years: 40.00    Total pack years: 40.00    Types: Cigarettes    Quit date: 02/22/2014    Years since quitting: 8.3   Smokeless tobacco: Never  Vaping Use   Vaping Use: Never used  Substance and Sexual Activity   Alcohol use: No   Drug use: No   Sexual activity: Never  Other Topics Concern   Not on file  Social History Narrative   Married with 2 children.  Independent of ADLs.      Does not have a living will.   Would desire CPR but would not want prolonged life support if futile- husband aware.   Social Determinants of Health   Financial Resource Strain: Low Risk  (04/11/2022)   Overall Financial Resource Strain (CARDIA)    Difficulty of Paying Living Expenses: Not hard at all  Food Insecurity: No Food Insecurity (04/11/2022)    Hunger Vital Sign    Worried About Running Out of Food in the Last Year: Never true  Ran Out of Food in the Last Year: Never true  Transportation Needs: No Transportation Needs (04/11/2022)   PRAPARE - Hydrologist (Medical): No    Lack of Transportation (Non-Medical): No  Physical Activity: Insufficiently Active (04/11/2022)   Exercise Vital Sign    Days of Exercise per Week: 5 days    Minutes of Exercise per Session: 20 min  Stress: No Stress Concern Present (04/11/2022)   [place]    Feeling of Stress : Not at all  Social Connections: Not on file    Allergies:  Allergies  Allergen Reactions   Amoxicillin-Pot Clavulanate Other (See Comments)    REACTION: gi upset Has patient had a PCN reaction causing immediate rash, facial/tongue/throat swelling, SOB or lightheadedness with hypotension: No Has patient had a PCN reaction causing severe rash involving mucus membranes or skin necrosis: No Has patient had a PCN reaction that required hospitalization : No Has patient had a PCN reaction occurring within the last 10 years: No If all of the above answers are "NO", then may proceed with Cephalosporin use.    Cefdinir Other (See Comments)    gi  upset   Singulair [Montelukast Sodium] Itching        Moxifloxacin Rash    Objective:    Vital Signs:   Temp:  [93.5 F (34.2 C)-98 F (36.7 C)] 93.5 F (34.2 C) (10/26 0743) Pulse Rate:  [69-89] 81 (10/26 0841) Resp:  [12-23] 21 (10/26 0841) BP: (87-122)/(29-53) 93/45 (10/26 0841) SpO2:  [80 %-100 %] 95 % (10/26 0841) FiO2 (%):  [60 %] 60 % (10/26 0841) Weight:  [60.8 kg] 60.8 kg (10/25 1605) Last BM Date : 06/12/2022  Weight change: Filed Weights   06/21/2022 1605  Weight: 60.8 kg    Intake/Output:   Intake/Output Summary (Last 24 hours) at 2022-07-14 0901 Last data filed at 07/08/2022 1831 Gross per 24 hour  Intake --  Output 400  ml  Net -400 ml      Physical Exam    General:  On BiPAP. Ill appearing HEENT: normal Neck: supple. JVP ~10. Carotids 2+ bilat; no bruits. Cor: PMI nondisplaced. Regular rate & rhythm. No rubs, gallops or murmurs. Lungs: minimal air movement Abdomen: soft, non-distended.  Extremities: no cyanosis, clubbing, rash, edema Neuro: Opens eyes to voice. Not following commands   Telemetry   SR 80s  EKG    SR 69 bpm, 1st degree AVB  Labs   Basic Metabolic Panel: Recent Labs  Lab 06/30/22 1351 06/16/2022 1617 06/16/2022 1637 2022-07-14 0500  NA 144 144 141 146*  K 4.2 4.7 4.7 5.1  CL 95* 92*  --  94*  CO2 37* 39*  --  42*  GLUCOSE 139* 94  --  93  BUN 29* 32*  --  33*  CREATININE 1.58* 1.67*  --  1.94*  CALCIUM 9.0 9.1  --  8.9    Liver Function Tests: Recent Labs  Lab 06/21/2022 1617  AST 25  ALT 16  ALKPHOS 97  BILITOT 0.6  PROT 6.1*  ALBUMIN 3.3*   No results for input(s): "LIPASE", "AMYLASE" in the last 168 hours. No results for input(s): "AMMONIA" in the last 168 hours.  CBC: Recent Labs  Lab 06/08/2022 1617 07/07/2022 1637 2022-07-14 0500  WBC 3.8*  --  5.2  NEUTROABS 2.4  --   --   HGB 9.5* 9.9* 9.6*  HCT 30.4* 29.0* 33.0*  MCV 106.3*  --  112.6*  PLT 134*  --  157    Cardiac Enzymes: No results for input(s): "CKTOTAL", "CKMB", "CKMBINDEX", "TROPONINI" in the last 168 hours.  BNP: BNP (last 3 results) Recent Labs    06/19/22 1439 06/30/22 1351 06/18/2022 1617  BNP 1,099.7* 1,528.5* 1,740.4*    ProBNP (last 3 results) No results for input(s): "PROBNP" in the last 8760 hours.   CBG: No results for input(s): "GLUCAP" in the last 168 hours.  Coagulation Studies: No results for input(s): "LABPROT", "INR" in the last 72 hours.   Imaging   DG CHEST PORT 1 VIEW  Result Date: 07/05/2022 CLINICAL DATA:  Shortness of breath EXAM: PORTABLE CHEST 1 VIEW COMPARISON:  Chest radiograph 06/27/2022 FINDINGS: The heart is enlarged, unchanged. The upper  mediastinal contours are stable. Small right larger than left pleural effusions with adjacent airspace opacities, worse in the right base, are unchanged. The upper lungs remain aerated. There is no new or worsening focal airspace disease. There is no pneumothorax There is no acute osseous abnormality. IMPRESSION: Stable right larger than left pleural effusions with bibasilar airspace opacities which may reflect atelectasis or pneumonia. Electronically Signed   By: Valetta Mole M.D.   On: Jul 05, 2022 08:06   DG Chest Port 1 View  Result Date: 06/24/2022 CLINICAL DATA:  Heart failure.  Short of breath EXAM: PORTABLE CHEST 1 VIEW COMPARISON:  CT 06/03/2022, chest radiograph 06/02/2022 FINDINGS: Normal cardiac silhouette. Bilateral pleural effusions greater on the RIGHT. Findings similar comparison CT. Upper lungs clear. No pneumothorax. Atherosclerotic calcification of the aorta. IMPRESSION: Bilateral pleural effusions and cardiomegaly similar to comparison exam. Electronically Signed   By: Suzy Bouchard M.D.   On: 06/17/2022 16:46     Medications:     Current Medications:  ambrisentan  10 mg Oral Daily   aspirin EC  81 mg Oral Daily   ferrous sulfate  325 mg Oral BID WC   furosemide  40 mg Intravenous Q12H   heparin  5,000 Units Subcutaneous Q8H   hydrOXYzine  25-50 mg Oral QHS   levothyroxine  75 mcg Oral Q0600   midodrine  10 mg Oral BID WC   potassium chloride SA  20 mEq Oral Daily   sodium chloride flush  3 mL Intravenous Q12H   tadalafil  40 mg Oral Daily    Infusions:     Patient Profile   76 y.o. female with history of chronic diastolic CHF with RV failure, PAH, severe COPD,    Assessment/Plan   Acute on chronic respiratory failure with hypoxia and hypercarbia: - Significant decline in status overnight. Now on BiPAP and not following commands. ABG 7:30 am: PH 7.23/CO2 112/HCO3 47 - Degree of respiratory failure out of proportion to volume overload. Suspect acute  exacerbation severe COPD contributing. ? PNA. - Procalcitonin, lactic acid, respiratory panel and blood cultures pending - CXR with bilateral pleural effusions and airspace opacities c/w ATX vs PNA - Discussed options for management with husband including transfer to ICU for urgent intubation (patient previously stated she would not want this), continuing BiPAP for the day to see if we can make some progress or transition to comfort measures. Her husband reports she has been declining for some time and acutely worsened the last few days. She expressed to family yesterday that she is "tired and ready to go".  - Family arriving around noon today. Will further discuss goals of care and request urgent palliative care consult.  2. Acute on chronic diastolic HF with RV failure: -  Repeat RHC 12/18 with mild PAH (mean PA 33) - Echo 5/18: with complete resolution of RV strain. No significant TR to estimate RVSP - Echo 4/19: with EF 65% no evidence of RV strain  - Echo 4/21: EF 70-75% RV normal  - Echo 11/21: EF 75% RV normal no evidence PAH - Echo 8/22: EF 60-65%, RV normal inadequate TR signal to assess PA pressure - Likely undiagnosed CTD though auto-immune panel previously negative. Does have psoriasis. Has seen Rheum  - Echo 9/23: EF 70-75%, RV okay, inadequate TR signal to assess PA pressure - She refused RHC during last admission. - NYHA III-IIIb, functional status limited by physical deconditioning. Not significantly volume overloaded on exam but BNP 1740 (1,099 2 weeks ago). Diuresing with IV lasix 40 BID. - GDMT limited by soft BP. Arlyce Harman on hold. Midodrine increased to 10 TID   2. PAH - Suspect combination of WHO Group I and III PAH - On Letairis and Tadalafil.  - Echo as above.   3.  COPD-Gold III - On 4L O2 at home, now on BiPAP - Suspect acute exacerbation - Follows with Pulmonary.   4.  PAF - Eliquis stopped due to severe LGIB 9/21 (hgb 3.1). Will not restart with AVMs. - Amio  stopped previously. - Remains in NSR on ECG today.   5. Cirrhosis - Due to RHF. Hepatitis serologies negative.    6. Left upper lobe density - 1.7 X 0.8 cm density noted medial left upper lobe on CTA chest - Felt to represent ATX vs PNA vs neoplasm  7. CKD IIIb/IV - Scr has been variable between 1.5-1.9, 1.94 today   GOC: -She is partial code, do not intubate/no compressions -Family considering transitioning to comfort measures as above - Palliative care consulted  Length of Stay: [place], [place], PA-C  July 29, 2022, 9:01 AM  Advanced Heart Failure Team Pager [phone] (M-F; 7a - 5p)  Please contact [facility] for night-coverage after hours (4p -7a ) and weekends on [email or web address]   Agree with above. 76 y/o woman with COPD, scleroderma, diastolic HF and PAH. Has been struggling at home for several weeks. Now admitted with severe respiratory failure with CO2 retention. Has not responded to Bipap. Volume status ok   General:  Ill appearing. Ob bipap essentially unresponsive HEENT: normal + bipap Neck: supple. no JVD. Carotids 2+ bilat; no bruits. No lymphadenopathy or thryomegaly appreciated. Cor: PMI nondisplaced. Regular rate & rhythm. 2/6 TR Lungs: minimal air movement Abdomen: soft, nontender, nondistended. No hepatosplenomegaly. No bruits or masses. Good bowel sounds. Extremities: no cyanosis, clubbing, rash, edema Neuro: obtunded  She has severe/end-stage hypercarbic respiratory failure and is not responding to BIPAP. I talked with her husband and 2 daughters and both say she would not want intubation. We reviewed options. There are no clearly reversible causes. At this point they have decided on comfort care. I have d/w Palliative Team personally who will help coordinate.   40 mins CCT  [name], MD  12:38 PM

## 2022-07-09 NOTE — Progress Notes (Signed)
RT called to assess pt. For sats dropping. Once arriving to the pt. Room, sats had improved back into the mid 90's. RT increased pt's fio2 to 14L High flow nasal cannula. Pt. Appeared sleepy but was arousable and was able to state that she was at [place]. No issues at the moment. sats remain in the 90's.

## 2022-07-09 NOTE — ED Notes (Addendum)
Patient woke up from a nap and is complaining that she "cannot catch her breath" obvious increase WOB and patient appears anxious. Dr Posey Pronto notified via secure chat and messages acknowledgement

## 2022-07-09 NOTE — Consult Note (Signed)
Consultation Note Date: 2022/07/08   Patient Name: Linda Stevens  DOB: 1946-01-03  MRN: [id number]  Age / Sex: 76 y.o., female  PCP: [name], MD Referring Physician: Domenic Polite, MD  Reason for Consultation: Establishing goals of care  HPI/Patient Profile: 76 y.o. female  with past medical history of COPD, chronic respiratory failure with hypoxia and hypercarbia on 4-5 L O2 via [place] at home, chronic diastolic CHF, pulmonary HTN with RV failure, PAF not on anticoagulation due to history of recurrent GI bleed/AVMs, hepatic cirrhosis, chronic anemia, CKD stage IIIb admitted on 06/29/2022 with respiratory failure due to COPD and heart failure. Ongoing decline requiring BiPAP. Family have elected transition to comfort care.   Clinical Assessment and Goals of Care: I reviewed records and discussed with Dr. Haroldine Laws. I met with family at bedside and they have made the decision to transition to full comfort care. We discussed what this would look like, symptom management, and timing of transition. I followed throughout the day and ordered dilaudid infusion to ensure comfort throughout transition off BiPAP and end of life. Followed up after dilaudid infusion initiated and removed BiPAP once she appeared comfortable and family was at bedside. Spent time with family and explained signs symptoms and expectations at end of life and throughout dying process. They are holding vigil at bedside. Ms. Mazurkiewicz has remained comfortable throughout her transition.   All questions/concerns addressed. Emotional support provided.   Primary Decision Maker NEXT OF KIN husband    SUMMARY OF RECOMMENDATIONS   DNR Comfort care Anticipate hospital death  Code Status/Advance Care Planning: DNR   Symptom Management:  Dilaudid infusion to ensure comfort.   Prognosis:  Hours - Days  Discharge Planning: Anticipated  Hospital Death      Primary Diagnoses: Present on Admission:  Acute on chronic diastolic (congestive) heart failure (HCC)  Acute on chronic respiratory failure with hypoxia and hypercapnia (HCC)  COPD GOLD II criteria but 02 dep 24/7   Hypothyroid  Pulmonary artery hypertension (HCC)  Stage 3b chronic kidney disease (CKD) (HCC)  Anemia  Hypotension   I have reviewed the medical record, interviewed the patient and family, and examined the patient. The following aspects are pertinent.  Past Medical History:  Diagnosis Date   Acute respiratory failure with hypoxia and hypercapnea 02/26/2014   Allergic rhinitis due to pollen    Allergy    Anemia 06/12/2020   Arthritis    Asthma    CHF (congestive heart failure) (HCC)    COPD (chronic obstructive pulmonary disease) (HCC)    Diastolic dysfunction    a. 02/2014 Echo: EF 65-70%, Gr 1 DD, RVH;  b. 09/2015 Echo: EF 55%, Gr 1 DD.   Emphysema of lung (HCC)    H/O seasonal allergies    History of chicken pox    History of tobacco abuse    a. Quit 2015.   History of UTI    Hypertension    Hypertensive heart disease    Lower extremity edema    Pericardial  effusion    a. 09/2015 Echo: Mod eff w/o tamponade.   Right heart failure with reduced right ventricular function ([place])    a. 09/2015 Echo: EF 55%, Gr 1 DD, D shaped IV septum, sev dil RV with mod reduced fxn, mod dil RA, RV-RA grad 44mHg, PASP 852mg, mod pericard eff w/o tamponade.   Severe Pulmonary Hypertension    a. 09/2015 Echo: PASP 8731m.   Social History   Socioeconomic History   Marital status: Married    Spouse name: Not on file   Number of children: Not on file   Years of education: 2   Highest education level: Not on file  Occupational History   Occupation: retired teaPharmacist, hospitalobacco Use   Smoking status: Former    Packs/day: 1.00    Years: 40.00    Total pack years: 40.00    Types: Cigarettes    Quit date: 02/22/2014    Years since quitting: 8.3   Smokeless  tobacco: Never  Vaping Use   Vaping Use: Never used  Substance and Sexual Activity   Alcohol use: No   Drug use: No   Sexual activity: Never  Other Topics Concern   Not on file  Social History Narrative   Married with 2 children.  Independent of ADLs.      Does not have a living will.   Would desire CPR but would not want prolonged life support if futile- husband aware.   Social Determinants of Health   Financial Resource Strain: Low Risk  (04/11/2022)   Overall Financial Resource Strain (CARDIA)    Difficulty of Paying Living Expenses: Not hard at all  Food Insecurity: No Food Insecurity (04/11/2022)   Hunger Vital Sign    Worried About Running Out of Food in the Last Year: Never true    Ran Out of Food in the Last Year: Never true  Transportation Needs: No Transportation Needs (04/11/2022)   PRAPARE - TraHydrologistedical): No    Lack of Transportation (Non-Medical): No  Physical Activity: Insufficiently Active (04/11/2022)   Exercise Vital Sign    Days of Exercise per Week: 5 days    Minutes of Exercise per Session: 20 min  Stress: No Stress Concern Present (04/11/2022)   [place] Feeling of Stress : Not at all  Social Connections: Not on file   Family History  Problem Relation Age of Onset   Glaucoma Brother    Vascular Disease Father        Died of complications from surgery   Heart attack Father    Asthma Mother        Died of asthma attack   Dementia Mother    Hyperlipidemia Mother    Hypertension Mother    Scheduled Meds:  furosemide  40 mg Intravenous Q12H   ipratropium-albuterol  3 mL Nebulization QID   sodium chloride flush  3 mL Intravenous Q12H   Continuous Infusions: PRN Meds:.acetaminophen **OR** acetaminophen, albuterol, HYDROmorphone (DILAUDID) injection, ondansetron **OR** ondansetron (ZOFRAN) IV Allergies  Allergen Reactions   Amoxicillin-Pot  Clavulanate Other (See Comments)    REACTION: gi upset Has patient had a PCN reaction causing immediate rash, facial/tongue/throat swelling, SOB or lightheadedness with hypotension: No Has patient had a PCN reaction causing severe rash involving mucus membranes or skin necrosis: No Has patient had a PCN reaction that required hospitalization : No Has patient had a PCN reaction occurring within  the last 10 years: No If all of the above answers are "NO", then may proceed with Cephalosporin use.    Cefdinir Other (See Comments)    gi  upset   Singulair [Montelukast Sodium] Itching        Moxifloxacin Rash   Review of Systems  Unable to perform ROS: Acuity of condition    Physical Exam Vitals and nursing note reviewed.  Constitutional:      General: She is not in acute distress.    Appearance: She is ill-appearing.  Cardiovascular:     Rate and Rhythm: Bradycardia present.  Pulmonary:     Effort: No tachypnea, accessory muscle usage or respiratory distress.     Comments: Shallow Abdominal:     Palpations: Abdomen is soft.  Neurological:     Mental Status: She is unresponsive.     Comments: Sedated on dilaudid infusion     Vital Signs: BP (!) 88/40   Pulse 83   Temp (!) 92.5 F (33.6 C) (Axillary)   Resp 16   Ht _0  (1.575 m)   Wt 60.8 kg   SpO2 97%   BMI 24.51 kg/m  Pain Scale: 0-10   Pain Score: 0-No pain   SpO2: SpO2: 97 % O2 Device:SpO2: 97 % O2 Flow Rate: .O2 Flow Rate (L/min): 14 L/min  IO: Intake/output summary:  Intake/Output Summary (Last 24 hours) at 07-18-22 1315 Last data filed at 06/19/2022 1831 Gross per 24 hour  Intake --  Output 400 ml  Net -400 ml    LBM: Last BM Date : 07/08/2022 Baseline Weight: Weight: 60.8 kg Most recent weight: Weight: 60.8 kg     Palliative Assessment/Data:     Time In: 1200  Time Total: 85 min  Greater than 50%  of this time was spent counseling and coordinating care related to the above assessment and  plan.  Signed by: Vinie Sill, NP Palliative Medicine Team Pager # [phone] (M-F 8a-5p) Team Phone # [phone] (Nights/Weekends)

## 2022-07-09 NOTE — Progress Notes (Signed)
Heart Failure Navigator Progress Note  Assessed for Heart & Vascular TOC clinic readiness.  Patient does not meet criteria due to Advanced Heart Failure patient of Dr. Haroldine Laws.     Earnestine Leys, BSN, Clinical cytogeneticist Only

## 2022-07-09 NOTE — Progress Notes (Signed)
Patient expired at 5:47 pm. Family members in the room.Vinie Sill NP in the room and certified.All questions answered, Family comforted.

## 2022-07-09 NOTE — ED Notes (Addendum)
Since this RN's last contact with provider via secure chat the patient has continually declined as far as respiratory drive and mentation. She had a spell where she dropped to 80% on 10L humitified 02 via [place]. Respiratory called to bedside because after some noxious stimuli provided by this RN the patient was agitated a bit and increased to 90% spo2. Malinda RN and this RN have noticed a decline in her mentation as previously she was able to speak in full sentences and she only groaned when provided a sternal rub. Dr Lily Kocher notified via secure chat regarding situation

## 2022-07-09 NOTE — Progress Notes (Signed)
Overnight progress note  Informed by RN that patient's became short of breath earlier when laying flat and had increased work of breathing.  Her sats were dropping at that time on 10 L HFNC.  Not wheezing but was given albuterol neb treatment due to increased work of breathing and oxygen increased to 14 L HFNC after which patient improved.  Currently satting 99% on 14 L HFNC.  Not tachycardic or tachypneic.  Afebrile.  Blood pressure stable.  However, patient's mentation has declined and she has become more somnolent although she is arousable.  Chart reviewed.  In brief, patient with history of COPD, chronic hypoxic hypercapnic respiratory failure on 4-5 L O2 at home, chronic diastolic CHF, pulmonary hypertension with RV failure, paroxysmal A-fib not on anticoagulation due to history of recurrent GI bleeds/AVMs, hepatic cirrhosis, chronic anemia, CKD stage IIIb admitted for acute on chronic hypoxic respiratory failure secondary to acute on chronic diastolic CHF.  She was requiring 10 L HFNC at the time of admission.  BNP 1740.  Chest x-ray showing bilateral pleural effusions.  Echo done 06/03/2022 showing EF 70 to 35%, grade 2 diastolic dysfunction, small pericardial effusion, mild mitral stenosis.  Patient seen and examined at bedside.  Noted to be somnolent but did respond to her name by opening her eyes.  Otherwise not answering questions or following any commands.  Satting 99% on 14 L HFNC, respiratory rate 13, heart rate 80 (sinus rhythm), blood pressure 122/46 (MAP 68).  Mild bibasilar rales appreciated upon auscultation of the lungs but no wheezing.  -Stat ABG ordered to assess for hypercapnia -She last received IV Lasix 40 mg at [place] yesterday.  Advised RN to give morning dose of IV Lasix 40 mg now. -Stat repeat chest x-ray

## 2022-07-09 NOTE — Progress Notes (Signed)
PT transported to 3E18 from room 11 in ED on BiPAP without any complications. RT will continue to monitor.

## 2022-07-09 NOTE — Progress Notes (Signed)
Lab notified Charge RN of critical pCO2 of 100 of recent ABG draw. Charge RN notified primary RN and made RT aware.

## 2022-07-09 NOTE — Progress Notes (Signed)
Remaining  dilaudid  39.36 ml wasted.Witnessed by Francene Finders RN

## 2022-07-09 DEATH — deceased

## 2022-07-18 ENCOUNTER — Encounter (HOSPITAL_COMMUNITY): Payer: Medicare HMO

## 2022-08-08 NOTE — Discharge Summary (Signed)
Death Summary  Linda Stevens BHE:[id number] DOB: [birth year] DOA: 07-11-2022  PCP: [name], MD  Admit date: July 11, 2022 Date of Death: 07-12-2022  Final Diagnoses:  Principal Problem:   Acute on chronic diastolic (congestive) heart failure (HCC) Active Problems:   Acute on chronic respiratory failure with hypoxia and hypercapnia (HCC) Severe right ventricular failure Severe pulmonary hypertension   Hypotension   Pulmonary artery hypertension (HCC)   COPD GOLD II criteria but 02 dep 24/7    Stage 3b chronic kidney disease (CKD) (HCC)   Anemia   Hypothyroid   History of present illness:  76 y.o. female chronically ill with COPD, chronic respiratory failure with hypoxia and hypercarbia on 4-5 L O2 via [place] at home, chronic diastolic CHF, pulmonary HTN with RV failure, PAF not on anticoagulation due to history of recurrent GI bleed/AVMs, hepatic cirrhosis, chronic anemia, CKD stage IIIb who presented to the ED for evaluation of progressive shortness of breath History of ongoing symptoms X 1 week with dyspnea and orthopnea, productive cough. -Switched to torsemide yesterday just prior to admission, EMS was called, O2 sats were 87% on 4 L -In the ED more hypoxic, placed on 10 L high flow nasal cannula, labs noted WBC of 3.8, creatinine 1.6, troponin 14, BNP 1740 -Chest x-ray with bilateral pleural effusions -Increased work of breathing earlier this morning in the ED, placed on BiPAP, ABG with hypercarbic respiratory acidosis    Hospital Course:   Acute on chronic hypoxic and hypercarbic respiratory failure Acute on chronic diastolic CHF  RV failure, severe pulmonary hypertension -Echo 9/26 with EF of 70-75%, moderate LVH, grade 2 diastolic dysfunction, RV failure -Diuresed with IV Lasix, complicated by hypotension, encephalopathy -Seen by heart failure team, felt to have severe end-stage heart disease with worsening respiratory failure, not responding to BiPAP, patient and  family did not want intubation comfort measures were discussed with patient's family, seen by palliative care and transitioned to comfort care -She expired on 07-13-2023 at 5:47 PM   COPD/chronic hypoxic respiratory failure -At baseline on 4 to 5 L home O2 -Reported productive cough in the ED, COVID PCR is negative   Hypothermia -Likely secondary to respiratory failure, known liver disease, low BPs etc.   Pulmonary artery hypertension (HCC) -on tadalafil and Ambrisentan.   Stage 3b chronic kidney disease (CKD) (HCC)   Hypothyroid Continue Synthroid.   Anemia Secondary to chronic disease and chronic GI bleeding/AVMs.  Hemoglobin stable at 9.5.   Liver cirrhosis  Time: 16mn  Signed:  PDomenic Polite [facility] 07/17/2022, 1:32 PM

## 2023-09-28 IMAGING — CT CT CHEST W/O CM
2 of 4 series · 15 of 36 positions shown, 18 images · non-contrast
Comparison: Radiography 08/19/2021. CT 05/03/2021. 02/19/2020.
07/24/2017.

CLINICAL DATA: Abnormal x-ray. Lung opacities. Follow-up COPD.
Respiratory failure with hypoxia.

EXAM:
CT CHEST WITHOUT CONTRAST
TECHNIQUE: Multidetector CT imaging of the chest was performed following the
standard protocol without IV contrast.

[Series 2: thorax · axial · 0.70mm/px · z∈[-273,-7]mm · 12 of 159 slices shown, 15 images]
[im 13/159  mediastinal]
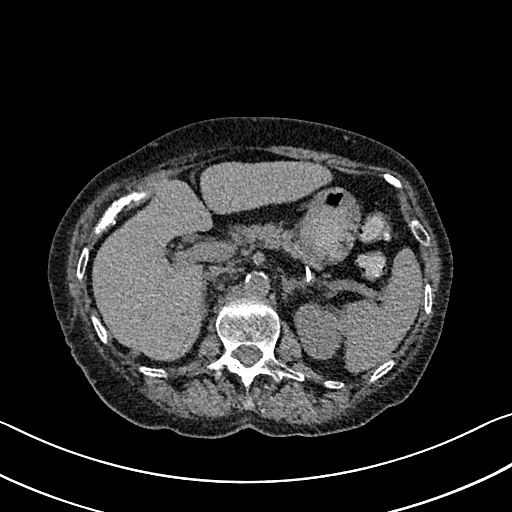
[im 13/159  lung]
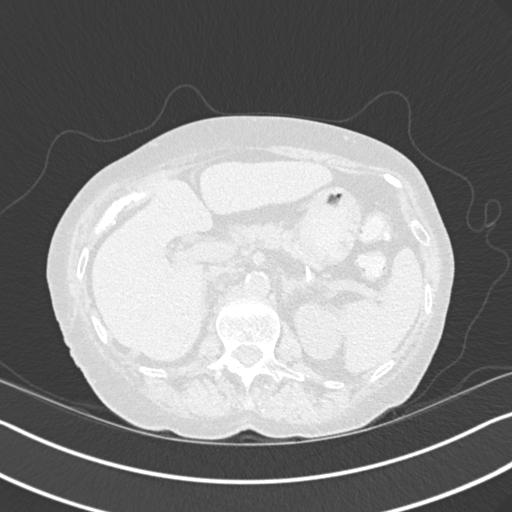
[im 25/159  lung]
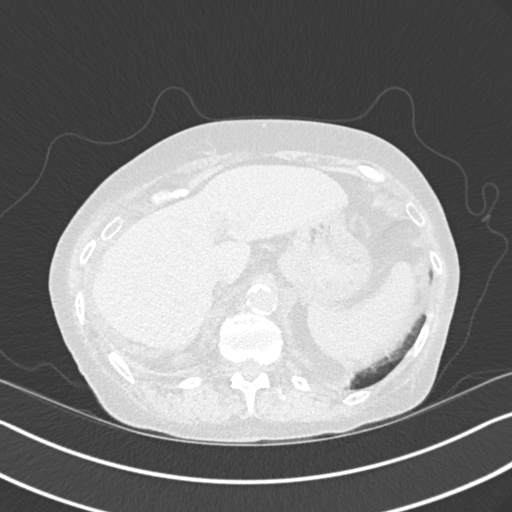
[im 37/159  lung]
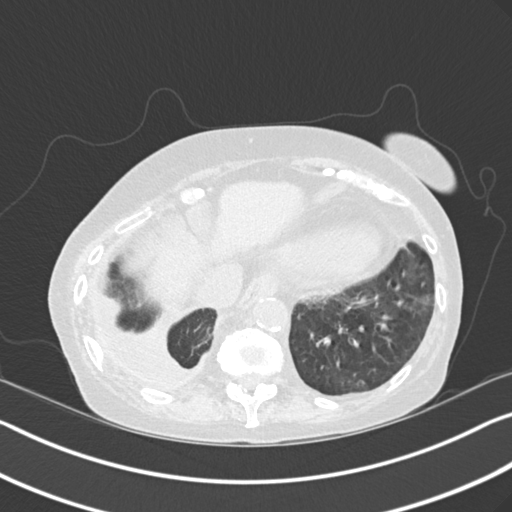
[im 49/159  lung]
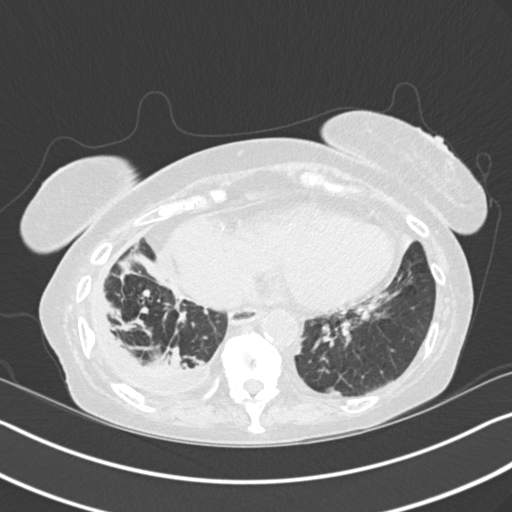
[im 61/159  mediastinal]
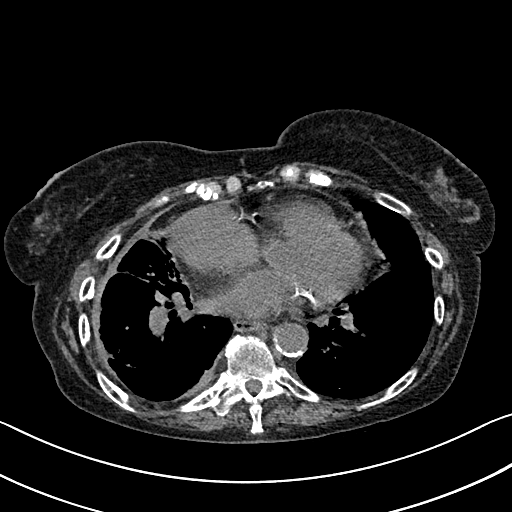
[im 61/159  lung]
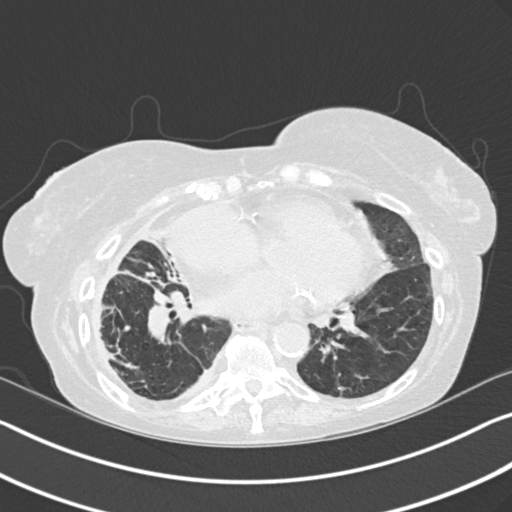
[im 73/159  lung]
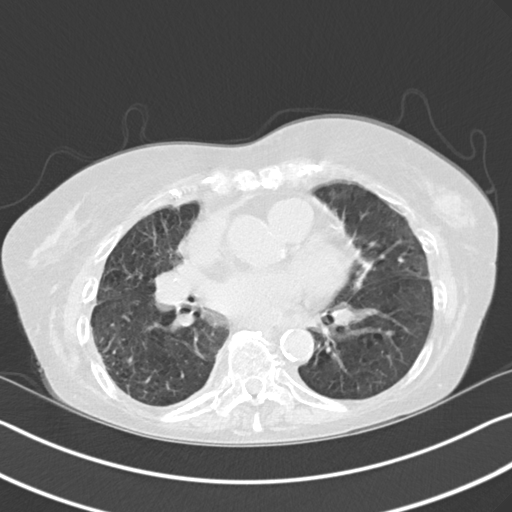
[im 86/159  lung]
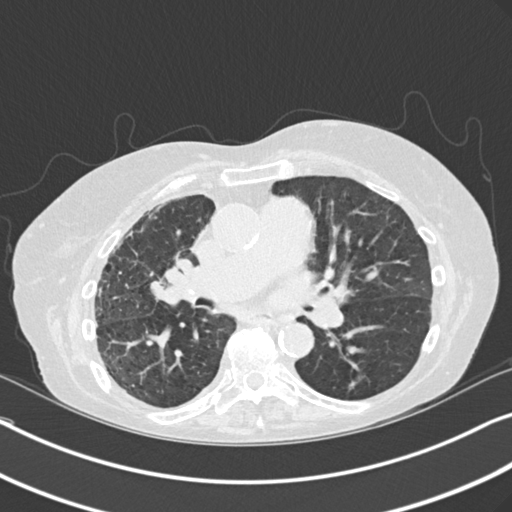
[im 98/159  lung]
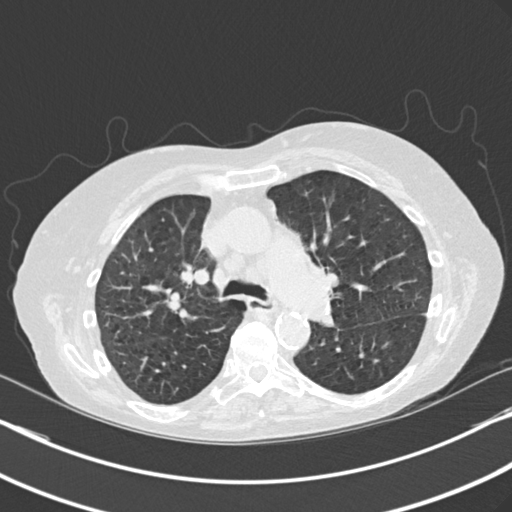
[im 110/159  mediastinal]
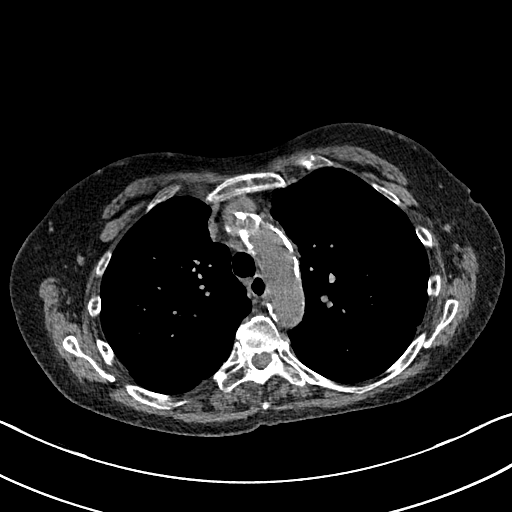
[im 110/159  lung]
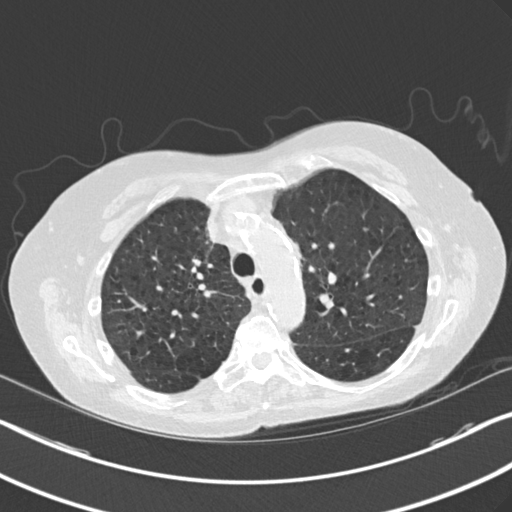
[im 122/159  lung]
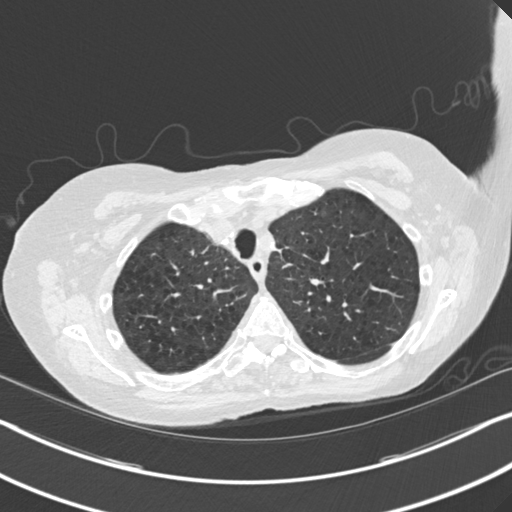
[im 134/159  lung]
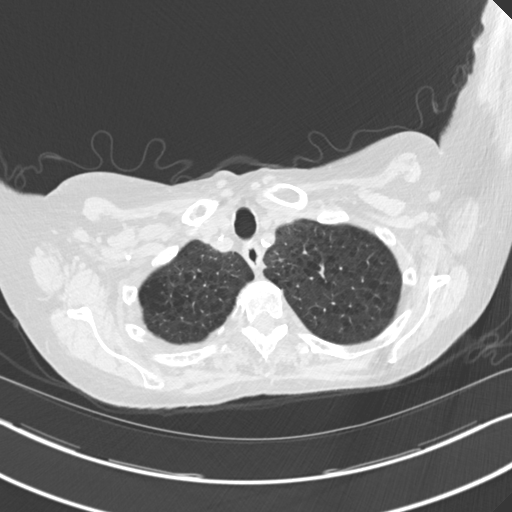
[im 146/159  lung]
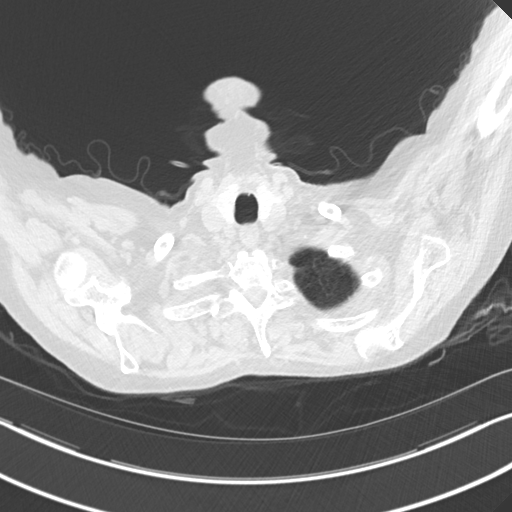

[Series 5: coronal · coronal · 0.62mm/px · 3 of 98 slices shown]
[im 20/98  lung]
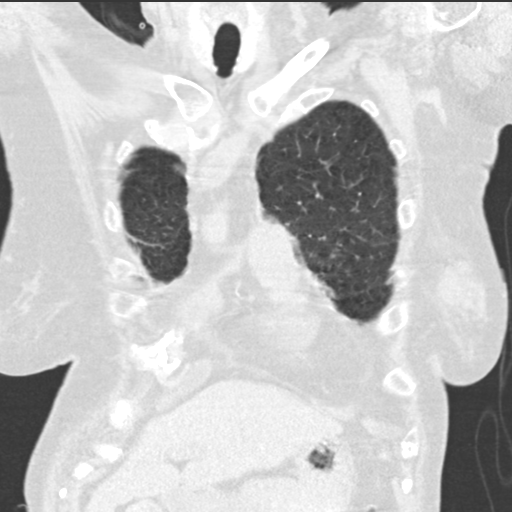
[im 39/98  lung]
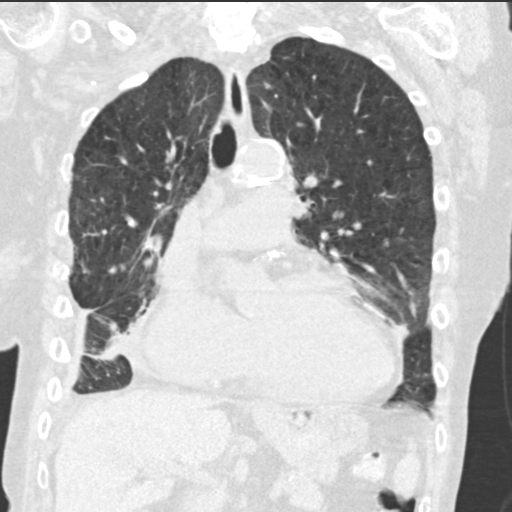
[im 59/98  lung]
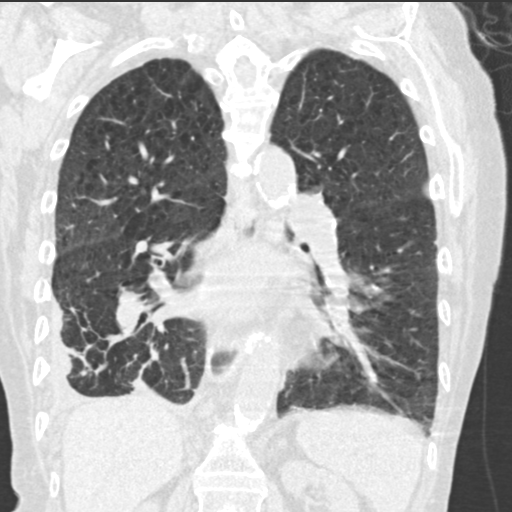

[15 of 36 positions shown; findings below may reference images not displayed]

FINDINGS: Cardiovascular: Cardiomegaly is present. No pericardial fluid. There
is coronary artery calcification. There is aortic atherosclerotic
calcification. The pulmonary arteries are prominent, suggesting a
degree of pulmonary arterial hypertension.

Mediastinum/Nodes: No mass or adenopathy. Chronic calcified node
right hilum.

Lungs/Pleura: On the left, there has been resolution of the small
pleural effusion seen in [REDACTED]. The left lung shows background
emphysema, upper lobe predominant. Patchy infiltrate previously seen
in the left upper lobe and superior segment left lower lobe have
resolved. Mild scarring in the posterior left lower lobe is again
demonstrated.

On the right, there is background emphysema, upper lobe predominant.
No focal or active process seen otherwise in the right upper lobe.
There is chronic pleural thickening and scarring at the right lung
base with chronic volume loss. There is slightly less pleural
density when compared the study [DATE], suggesting that an
element of pleural fluid is reduced.

Upper Abdomen: Previous cholecystectomy.

Musculoskeletal: Chronic thoracic degenerative changes. Newly seen
superior endplate fractures at T11 and T12 with loss of height of
only about 10%. These were not present last [REDACTED].
IMPRESSION: Background pattern of centrilobular emphysema, upper lobe
predominant. Chronic cardiomegaly. Chronic aortic atherosclerotic
calcification and coronary artery calcification.

Resolution of small left effusion seen last [REDACTED].

Some decrease also in more extensive pleural density on the right. A
large part of this in the lower chest probably represents chronic
pleural thickening. There is chronic volume loss in the right lower
lung.

Newly seen superior endplate fractures at T11 and T12 with loss of
height of only 10%. Exact age is indeterminate. This could be acute
or simply new since [REDACTED] of last year. The degree healing cannot
be established by this CT.
